# Patient Record
Sex: Female | Born: 1945
Health system: Southern US, Community
[De-identification: ages and names within clinical notes are randomized; demographics above are authoritative.]

## PROBLEM LIST (undated history)

## (undated) DIAGNOSIS — W5501XA Bitten by cat, initial encounter: Secondary | ICD-10-CM

## (undated) DIAGNOSIS — I6529 Occlusion and stenosis of unspecified carotid artery: Secondary | ICD-10-CM

## (undated) DIAGNOSIS — C539 Malignant neoplasm of cervix uteri, unspecified: Secondary | ICD-10-CM

## (undated) DIAGNOSIS — J449 Chronic obstructive pulmonary disease, unspecified: Secondary | ICD-10-CM

## (undated) DIAGNOSIS — I739 Peripheral vascular disease, unspecified: Secondary | ICD-10-CM

## (undated) DIAGNOSIS — I2510 Atherosclerotic heart disease of native coronary artery without angina pectoris: Secondary | ICD-10-CM

## (undated) DIAGNOSIS — C801 Malignant (primary) neoplasm, unspecified: Secondary | ICD-10-CM

## (undated) DIAGNOSIS — E039 Hypothyroidism, unspecified: Secondary | ICD-10-CM

## (undated) DIAGNOSIS — E785 Hyperlipidemia, unspecified: Secondary | ICD-10-CM

## (undated) DIAGNOSIS — Z720 Tobacco use: Secondary | ICD-10-CM

## (undated) DIAGNOSIS — K219 Gastro-esophageal reflux disease without esophagitis: Secondary | ICD-10-CM

## (undated) DIAGNOSIS — N289 Disorder of kidney and ureter, unspecified: Secondary | ICD-10-CM

## (undated) DIAGNOSIS — I499 Cardiac arrhythmia, unspecified: Secondary | ICD-10-CM

## (undated) DIAGNOSIS — I10 Essential (primary) hypertension: Secondary | ICD-10-CM

## (undated) DIAGNOSIS — Z8489 Family history of other specified conditions: Secondary | ICD-10-CM

## (undated) DIAGNOSIS — M1990 Unspecified osteoarthritis, unspecified site: Secondary | ICD-10-CM

## (undated) DIAGNOSIS — R0989 Other specified symptoms and signs involving the circulatory and respiratory systems: Secondary | ICD-10-CM

## (undated) DIAGNOSIS — Z789 Other specified health status: Secondary | ICD-10-CM

## (undated) HISTORY — DX: Chronic obstructive pulmonary disease, unspecified: J44.9

## (undated) HISTORY — DX: Essential (primary) hypertension: I10

## (undated) HISTORY — DX: Other specified symptoms and signs involving the circulatory and respiratory systems: R09.89

## (undated) HISTORY — DX: Tobacco use: Z72.0

## (undated) HISTORY — DX: Bitten by cat, initial encounter: W55.01XA

## (undated) HISTORY — DX: Hyperlipidemia, unspecified: E78.5

## (undated) HISTORY — DX: Hypothyroidism, unspecified: E03.9

## (undated) HISTORY — DX: Occlusion and stenosis of unspecified carotid artery: I65.29

---

## 2009-04-30 HISTORY — PX: INCISION AND DRAINAGE / EXCISION THYROGLOSSAL CYST: SUR667

## 2010-01-15 ENCOUNTER — Emergency Department: Payer: Self-pay | Admitting: Emergency Medicine

## 2012-10-25 ENCOUNTER — Other Ambulatory Visit: Payer: Self-pay | Admitting: Family Medicine

## 2012-10-25 LAB — POTASSIUM: Potassium: 3.7 mmol/L (ref 3.5–5.1)

## 2013-08-18 ENCOUNTER — Ambulatory Visit: Payer: Self-pay | Admitting: Family Medicine

## 2013-09-02 ENCOUNTER — Ambulatory Visit: Payer: Self-pay | Admitting: Vascular Surgery

## 2013-09-15 DIAGNOSIS — I739 Peripheral vascular disease, unspecified: Secondary | ICD-10-CM | POA: Insufficient documentation

## 2013-09-15 DIAGNOSIS — E039 Hypothyroidism, unspecified: Secondary | ICD-10-CM | POA: Insufficient documentation

## 2013-10-13 DIAGNOSIS — J449 Chronic obstructive pulmonary disease, unspecified: Secondary | ICD-10-CM | POA: Insufficient documentation

## 2013-10-21 ENCOUNTER — Ambulatory Visit: Payer: Self-pay | Admitting: Vascular Surgery

## 2013-10-21 LAB — BASIC METABOLIC PANEL
Anion Gap: 6 — ABNORMAL LOW (ref 7–16)
BUN: 7 mg/dL (ref 7–18)
CREATININE: 0.8 mg/dL (ref 0.60–1.30)
Calcium, Total: 8.5 mg/dL (ref 8.5–10.1)
Chloride: 100 mmol/L (ref 98–107)
Co2: 26 mmol/L (ref 21–32)
EGFR (Non-African Amer.): >60
Glucose: 93 mg/dL (ref 65–99)
Osmolality: 262 (ref 275–301)
POTASSIUM: 3.8 mmol/L (ref 3.5–5.1)
Sodium: 132 mmol/L — ABNORMAL LOW (ref 136–145)

## 2013-10-21 LAB — CBC
HCT: 37 % (ref 35.0–47.0)
HGB: 12.9 g/dL (ref 12.0–16.0)
MCH: 32.8 pg (ref 26.0–34.0)
MCHC: 35 g/dL (ref 32.0–36.0)
MCV: 94 fL (ref 80–100)
Platelet: 301 10*3/uL (ref 150–440)
RBC: 3.94 10*6/uL (ref 3.80–5.20)
RDW: 13.3 % (ref 11.5–14.5)
WBC: 8 10*3/uL (ref 3.6–11.0)

## 2013-10-29 ENCOUNTER — Inpatient Hospital Stay: Payer: Self-pay | Admitting: Vascular Surgery

## 2013-10-30 HISTORY — PX: CAROTID ENDARTERECTOMY: SUR193

## 2013-10-30 LAB — APTT: Activated PTT: 33.2 secs (ref 23.6–35.9)

## 2013-10-30 LAB — PROTIME-INR
INR: 1.1
Prothrombin Time: 14.2 secs (ref 11.5–14.7)

## 2013-10-30 LAB — CBC WITH DIFFERENTIAL/PLATELET
BASOS PCT: 0.5 %
Basophil #: 0 10*3/uL (ref 0.0–0.1)
Eosinophil #: 0.1 10*3/uL (ref 0.0–0.7)
Eosinophil %: 1 %
HCT: 31.1 % — ABNORMAL LOW (ref 35.0–47.0)
HGB: 10.8 g/dL — AB (ref 12.0–16.0)
LYMPHS ABS: 1.3 10*3/uL (ref 1.0–3.6)
LYMPHS PCT: 14.5 %
MCH: 32.5 pg (ref 26.0–34.0)
MCHC: 34.8 g/dL (ref 32.0–36.0)
MCV: 94 fL (ref 80–100)
MONOS PCT: 7.4 %
Monocyte #: 0.7 x10 3/mm (ref 0.2–0.9)
Neutrophil #: 7.1 10*3/uL — ABNORMAL HIGH (ref 1.4–6.5)
Neutrophil %: 76.6 %
Platelet: 237 10*3/uL (ref 150–440)
RBC: 3.32 10*6/uL — ABNORMAL LOW (ref 3.80–5.20)
RDW: 13.5 % (ref 11.5–14.5)
WBC: 9.2 10*3/uL (ref 3.6–11.0)

## 2013-10-30 LAB — BASIC METABOLIC PANEL
Anion Gap: 6 — ABNORMAL LOW (ref 7–16)
BUN: 7 mg/dL (ref 7–18)
CO2: 25 mmol/L (ref 21–32)
CREATININE: 0.82 mg/dL (ref 0.60–1.30)
Calcium, Total: 8.1 mg/dL — ABNORMAL LOW (ref 8.5–10.1)
Chloride: 104 mmol/L (ref 98–107)
Glucose: 96 mg/dL (ref 65–99)
OSMOLALITY: 268 (ref 275–301)
Potassium: 3.9 mmol/L (ref 3.5–5.1)
Sodium: 135 mmol/L — ABNORMAL LOW (ref 136–145)

## 2013-10-31 LAB — PATHOLOGY REPORT

## 2014-04-06 DIAGNOSIS — E782 Mixed hyperlipidemia: Secondary | ICD-10-CM | POA: Diagnosis not present

## 2014-04-06 DIAGNOSIS — I739 Peripheral vascular disease, unspecified: Secondary | ICD-10-CM | POA: Diagnosis not present

## 2014-04-06 DIAGNOSIS — I10 Essential (primary) hypertension: Secondary | ICD-10-CM | POA: Diagnosis not present

## 2014-05-01 DIAGNOSIS — I10 Essential (primary) hypertension: Secondary | ICD-10-CM | POA: Diagnosis not present

## 2014-05-01 DIAGNOSIS — I6529 Occlusion and stenosis of unspecified carotid artery: Secondary | ICD-10-CM | POA: Diagnosis not present

## 2014-05-01 HISTORY — PX: OTHER SURGICAL HISTORY: SHX169

## 2014-05-05 ENCOUNTER — Ambulatory Visit
Admit: 2014-05-05 | Disposition: A | Discharge status: Home / Self Care | Payer: Self-pay | Attending: Vascular Surgery | Admitting: Vascular Surgery

## 2014-05-05 DIAGNOSIS — E039 Hypothyroidism, unspecified: Secondary | ICD-10-CM | POA: Diagnosis not present

## 2014-05-05 DIAGNOSIS — F17210 Nicotine dependence, cigarettes, uncomplicated: Secondary | ICD-10-CM | POA: Diagnosis not present

## 2014-05-05 DIAGNOSIS — E785 Hyperlipidemia, unspecified: Secondary | ICD-10-CM | POA: Diagnosis not present

## 2014-05-05 DIAGNOSIS — Z01812 Encounter for preprocedural laboratory examination: Secondary | ICD-10-CM | POA: Diagnosis not present

## 2014-05-05 DIAGNOSIS — J449 Chronic obstructive pulmonary disease, unspecified: Secondary | ICD-10-CM | POA: Diagnosis not present

## 2014-05-05 DIAGNOSIS — Z79899 Other long term (current) drug therapy: Secondary | ICD-10-CM | POA: Diagnosis not present

## 2014-05-05 DIAGNOSIS — R490 Dysphonia: Secondary | ICD-10-CM | POA: Diagnosis not present

## 2014-05-05 DIAGNOSIS — Z01818 Encounter for other preprocedural examination: Secondary | ICD-10-CM | POA: Diagnosis not present

## 2014-05-05 DIAGNOSIS — Z720 Tobacco use: Secondary | ICD-10-CM | POA: Diagnosis not present

## 2014-05-05 DIAGNOSIS — I10 Essential (primary) hypertension: Secondary | ICD-10-CM | POA: Diagnosis not present

## 2014-05-05 LAB — BASIC METABOLIC PANEL
ANION GAP: 10 (ref 7–16)
BUN: 11 mg/dL
CALCIUM: 9.2 mg/dL
Chloride: 95 mmol/L — ABNORMAL LOW
Co2: 27 mmol/L
Creatinine: 0.76 mg/dL
EGFR (African American): >60
EGFR (Non-African Amer.): >60
GLUCOSE: 93 mg/dL
POTASSIUM: 4.1 mmol/L
Sodium: 132 mmol/L — ABNORMAL LOW

## 2014-05-05 LAB — CBC
HCT: 37 % (ref 35.0–47.0)
HGB: 12.7 g/dL (ref 12.0–16.0)
MCH: 31.7 pg (ref 26.0–34.0)
MCHC: 34.2 g/dL (ref 32.0–36.0)
MCV: 93 fL (ref 80–100)
Platelet: 302 10*3/uL (ref 150–440)
RBC: 3.99 10*6/uL (ref 3.80–5.20)
RDW: 13.5 % (ref 11.5–14.5)
WBC: 6.6 10*3/uL (ref 3.6–11.0)

## 2014-05-05 LAB — APTT: Activated PTT: 33.7 secs (ref 23.6–35.9)

## 2014-05-05 LAB — PROTIME-INR
INR: 1
PROTHROMBIN TIME: 13.1 s

## 2014-05-13 ENCOUNTER — Inpatient Hospital Stay
Admit: 2014-05-13 | Disposition: A | Discharge status: Home / Self Care | Payer: Self-pay | Attending: Vascular Surgery | Admitting: Vascular Surgery

## 2014-05-13 DIAGNOSIS — I6522 Occlusion and stenosis of left carotid artery: Secondary | ICD-10-CM | POA: Diagnosis not present

## 2014-05-13 DIAGNOSIS — J449 Chronic obstructive pulmonary disease, unspecified: Secondary | ICD-10-CM | POA: Diagnosis not present

## 2014-05-13 DIAGNOSIS — F17210 Nicotine dependence, cigarettes, uncomplicated: Secondary | ICD-10-CM | POA: Diagnosis not present

## 2014-05-13 DIAGNOSIS — M1990 Unspecified osteoarthritis, unspecified site: Secondary | ICD-10-CM | POA: Diagnosis not present

## 2014-05-13 DIAGNOSIS — E785 Hyperlipidemia, unspecified: Secondary | ICD-10-CM | POA: Diagnosis not present

## 2014-05-13 DIAGNOSIS — F17200 Nicotine dependence, unspecified, uncomplicated: Secondary | ICD-10-CM | POA: Diagnosis not present

## 2014-05-13 DIAGNOSIS — E039 Hypothyroidism, unspecified: Secondary | ICD-10-CM | POA: Diagnosis not present

## 2014-05-14 LAB — PROTIME-INR
INR: 1
Prothrombin Time: 13.5 secs

## 2014-05-14 LAB — CBC WITH DIFFERENTIAL/PLATELET
BASOS PCT: 0.3 %
Basophil #: 0 10*3/uL (ref 0.0–0.1)
EOS ABS: 0.1 10*3/uL (ref 0.0–0.7)
Eosinophil %: 1.3 %
HCT: 33.2 % — ABNORMAL LOW (ref 35.0–47.0)
HGB: 11.2 g/dL — AB (ref 12.0–16.0)
LYMPHS PCT: 15.2 %
Lymphocyte #: 1.3 10*3/uL (ref 1.0–3.6)
MCH: 31.4 pg (ref 26.0–34.0)
MCHC: 33.7 g/dL (ref 32.0–36.0)
MCV: 93 fL (ref 80–100)
MONO ABS: 0.7 x10 3/mm (ref 0.2–0.9)
Monocyte %: 7.8 %
Neutrophil #: 6.5 10*3/uL (ref 1.4–6.5)
Neutrophil %: 75.4 %
PLATELETS: 235 10*3/uL (ref 150–440)
RBC: 3.56 10*6/uL — ABNORMAL LOW (ref 3.80–5.20)
RDW: 13.7 % (ref 11.5–14.5)
WBC: 8.6 10*3/uL (ref 3.6–11.0)

## 2014-05-14 LAB — BASIC METABOLIC PANEL
Anion Gap: 6 — ABNORMAL LOW (ref 7–16)
BUN: 11 mg/dL
CALCIUM: 8.7 mg/dL — AB
CHLORIDE: 96 mmol/L — AB
Co2: 27 mmol/L
Creatinine: 0.78 mg/dL
EGFR (African American): >60
Glucose: 100 mg/dL — ABNORMAL HIGH
POTASSIUM: 4.3 mmol/L
SODIUM: 129 mmol/L — AB

## 2014-05-14 LAB — APTT: ACTIVATED PTT: 35.6 s (ref 23.6–35.9)

## 2014-05-23 NOTE — Op Note (Signed)
PATIENT NAME:  Mary Hoover, Mary Hoover MR#:  [id number] DATE OF BIRTH:  1945-03-04  DATE OF PROCEDURE:  10/29/2013  PREOPERATIVE DIAGNOSES:  1.  Bilateral carotid artery stenosis, right greater than left.  2.  Tobacco dependence.  3.  Hypertension.   POSTOPERATIVE DIAGNOSES:  1.  Bilateral carotid artery stenosis, right greater than left.  2.  Tobacco dependence.  3.  Hypertension.   PROCEDURE:  Right carotid endarterectomy with CorMatrix arterial patch reconstruction.   SURGEON:  Algernon Huxley, MD.   ANESTHESIA:  General.   BLOOD LOSS:  50 mL.   INDICATION FOR PROCEDURE:  This is a 69 year old female without a lot of significant medical history who was referred to [place] for evaluation of carotid artery stenosis. She was found to have about a 70% right ICA stenosis and about 50-60% left ICA stenosis. Given her young age and relative good health and a 70% or more stenosis I felt it was reasonable to offer her right carotid endarterectomy for stroke risk reduction. Risks and benefits were discussed. Informed consent was obtained.   DESCRIPTION OF PROCEDURE: The patient was brought to the operative suite. After an adequate level of general anesthesia was obtained, she was placed in the modified beach chair position. Her right neck was flexed and turned to the side and then sterilely prepped and draped and a sterile surgical field was created. An Charlie Pitter was used for occlusive seal. After appropriate surgical antibiotics and timeout an incision was created along the anterior border of the sternocleidomastoid we dissected down through the platysma with electrocautery. The sternocleidomastoid was retracted laterally. The facial vein and a couple of crossing venous branches were ligated and divided between silk ties exposing the carotid bifurcation. I anesthetized the carotid bifurcation with 1% lidocaine. A Weitlaner retractor was used to help facilitate our exposure. Vessel loops were then placed around the  common carotid artery, external carotid artery, superior thyroid artery and internal carotid artery distal to the lesion. The patient was heparinized with 5000 units of intravenous heparin. After this was allowed to circulate for 5 minutes control was pulled up on the vessel loops and anterior wall arteriotomy was created with an 11 blade and extended with Potts scissors. The Pruitt-Inahara shunt was then placed first in the internal carotid artery, flushed and de-aired, then in the common carotid artery and flow was restored. Approximately 2 minutes elapsed between clamping and restoration of flow. We then performed an endarterectomy in the typical fashion. The proximal endpoint was cut flush with tenotomy scissors. An eversion endarterectomy was performed on the external carotid artery and a nice feathered distal endpoint was created on the internal carotid artery with gentle traction. The distal endpoint was tacked down with two 7-0 Prolene sutures. All loose flecks were removed and the vessel was locally heparinized.   The CorMatrix arterial patch was brought onto the field to reconstruct the artery. Her arteries were reasonably small and primary closure would have likely resulted in recurrent stenosis. The patch was cut and beveled. We started at the distal endpoint with 6-0 Prolene suture. It was run approximately 1/2 the length of the arteriotomy both medially and laterally. The patch was then cut and beveled to an appropriate length proximally, and a second 6-0 Prolene was started at the proximal endpoint. This was run medially and the suture lines were tied together. The lateral suture line was run approximately 1/4 the length of the arteriotomy, and then the shunt was removed. The vessel was flushed in the  internal, external and common carotid arteries and locally heparinized. The suture line was then completed, flushing several cycles through the external carotid artery prior to releasing control. From  clamp removal to restoration of flow was approximately 2-3 minutes. Two 6-0 Prolene patch sutures were used for hemostasis and hemostasis was achieved. The wound was irrigated. Surgicel and Evicel topical hemostatic agents were placed and hemostasis was complete. The wound was then closed with 3 interrupted 3-0 Vicryl sutures in the sternocleidomastoid space. The platysma was closed with a running 3-0 Vicryl and the skin was closed with 4-0 Vicryl. Dermabond was placed as a dressing. The patient was awakened from anesthesia and taken to the recovery room in stable condition having tolerated the procedure well.    ____________________________ Algernon Huxley, MD jsd:lt D: 10/29/2013 09:42:30 ET T: 10/29/2013 11:39:13 ET JOB#: [id number]  cc: Algernon Huxley, MD, <Dictator> Enid Derry, MD Algernon Huxley MD ELECTRONICALLY SIGNED 11/11/2013 10:21

## 2014-05-25 LAB — SURGICAL PATHOLOGY

## 2014-05-31 NOTE — Op Note (Signed)
PATIENT NAME:  Mary Hoover, Mary Hoover MR#:  [id number] DATE OF BIRTH:  10-15-1945  DATE OF PROCEDURE:  05/13/2014  PREOPERATIVE DIAGNOSES:  1. High-grade left carotid artery stenosis.  2. Status post right carotid endarterectomy.  3. Hypothyroidism.  4. Hyperlipidemia.  5. Tobacco dependence.   POSTOPERATIVE DIAGNOSES:  1. High-grade left carotid artery stenosis.  2. Status post right carotid endarterectomy.  3. Hypothyroidism.  4. Hyperlipidemia.  5. Tobacco dependence.   PROCEDURE: Left carotid endarterectomy with CorMatrix arterial patch reconstruction.   SURGEON: Leotis Pain, MD   ANESTHESIA: General.   ESTIMATED BLOOD LOSS: 25 mL.   INDICATION FOR PROCEDURE: This is a 69 year old female with carotid artery stenosis. Last year, she underwent a right carotid endarterectomy for high-grade stenosis. She had a borderline high grade stenosis at that time on the left side. We followed this, but on her most recent duplex, her velocities had significantly elevated into the clearly the high-grade range of what would appear to be 80% or more. We recommended carotid endarterectomy for stroke risk reduction. Risks and benefits were discussed and informed consent was obtained.   DESCRIPTION OF PROCEDURE: The patient was brought to the operative suite. After an adequate level of general anesthesia was obtained, the patient was placed in modified beach chair position. Her neck was flexed, a roll was placed on her shoulder and her head was turned to the right. After appropriate surgical timeout and intravenous antibiotics, incision was created along the anterior border of the sternocleidomastoid and we dissected through the platysma with electrocautery. The sternocleidomastoid was retracted laterally the facial vein. This was ligated and divided between silk ties, exposing the carotid bifurcation. The vagus nerve was identified and protected from harm. The carotid artery was dissected out. The common carotid  artery, external carotid artery, superior thyroid artery, and internal carotid artery distal to the lesion, which was several centimeters into the internal carotid artery. This required identifying the hypoglossal nerve and protecting it from harm, which was done, and the patient was given 5000 units of intravenous heparin for systemic anticoagulation and control was then pulled up on the vessel loops and after this was allowed to circulate for 4 minutes. An anterior wall arteriotomy 11 blade and extended with Potts scissors. The Pruitt-Inahara shunt was placed. The endarterectomy was then performed in the typical fashion. The proximal endpoint was cut flush with tenotomy scissors. An eversion endarterectomy was performed on the external carotid artery and a nice feathered distal endpoint was created with gentle traction on the internal carotid artery. All loose flecks were removed. The distal endpoint was tacked down with two 7-0 Prolene sutures in the cor CorMatrix patch was used to reconstruct the artery. This was cut and beveled distally and a 6-0 Prolene was started at the distal endpoint and run approximately one half the length of the arteriotomy. The patch was then cut and beveled to an appropriate length to match the arteriotomy proximally and a second 6-0 Prolene was started. This was run medially and the suture line was tied together. The lateral suture line was run until the shunt needed to be removed. The shunt was then removed. The vessel was flushed in the internal, external and common carotid artery and locally heparinized. I then completed the suture line flushing through the external carotid artery and allowing several cardiac cycles to traverse up the external carotid artery prior to releasing control of the internal carotid artery. On release, two 6-0 Prolene patch sutures were used for hemostasis. Surgicel and  Evicel topical hemostatic agents were placed and hemostasis was achieved. The wound  was then closed with 3 interrupted 3-0 Vicryl sutures in the sternocleidomastoid space. The platysma was closed with a running 3-0 Vicryl and the skin was closed with 4-0 Monocryl. Dermabond was placed as a dressing. The patient was awakened from anesthesia and taken to the recovery room in stable condition, having tolerated the procedure well.   ____________________________ Mary Huxley, MD jsd:AT D: 05/13/2014 11:46:27 ET T: 05/13/2014 12:24:28 ET JOB#: [id number]  cc: Mary Huxley, MD, <Dictator> Mary Huxley MD ELECTRONICALLY SIGNED 05/27/2014 13:45

## 2014-06-03 DIAGNOSIS — D649 Anemia, unspecified: Secondary | ICD-10-CM | POA: Diagnosis not present

## 2014-06-03 DIAGNOSIS — E785 Hyperlipidemia, unspecified: Secondary | ICD-10-CM | POA: Diagnosis not present

## 2014-06-03 DIAGNOSIS — E871 Hypo-osmolality and hyponatremia: Secondary | ICD-10-CM | POA: Diagnosis not present

## 2014-06-25 DIAGNOSIS — I10 Essential (primary) hypertension: Secondary | ICD-10-CM | POA: Diagnosis not present

## 2014-06-25 DIAGNOSIS — E871 Hypo-osmolality and hyponatremia: Secondary | ICD-10-CM | POA: Diagnosis not present

## 2014-07-06 ENCOUNTER — Other Ambulatory Visit: Payer: Self-pay | Admitting: Family Medicine

## 2014-07-06 MED ORDER — LISINOPRIL 10 MG PO TABS: 10.0000 mg | ORAL_TABLET | Freq: Every day | ORAL | Status: DC

## 2014-09-11 DIAGNOSIS — I6529 Occlusion and stenosis of unspecified carotid artery: Secondary | ICD-10-CM | POA: Diagnosis not present

## 2014-09-11 DIAGNOSIS — I6523 Occlusion and stenosis of bilateral carotid arteries: Secondary | ICD-10-CM | POA: Diagnosis not present

## 2014-09-11 DIAGNOSIS — I10 Essential (primary) hypertension: Secondary | ICD-10-CM | POA: Diagnosis not present

## 2014-10-24 ENCOUNTER — Other Ambulatory Visit: Payer: Self-pay | Admitting: Family Medicine

## 2014-10-24 NOTE — Telephone Encounter (Signed)
Her HCTZ was stopped because of low Na+ and low Cl- Her recheck BMP in [facility] was better Last office note shows HCTZ was discontinued I'm denying this request

## 2014-10-28 DIAGNOSIS — E039 Hypothyroidism, unspecified: Secondary | ICD-10-CM | POA: Diagnosis not present

## 2014-11-02 DIAGNOSIS — E039 Hypothyroidism, unspecified: Secondary | ICD-10-CM | POA: Diagnosis not present

## 2014-11-13 DIAGNOSIS — Z9889 Other specified postprocedural states: Secondary | ICD-10-CM | POA: Insufficient documentation

## 2014-11-13 DIAGNOSIS — J449 Chronic obstructive pulmonary disease, unspecified: Secondary | ICD-10-CM | POA: Insufficient documentation

## 2014-11-13 DIAGNOSIS — Z720 Tobacco use: Secondary | ICD-10-CM | POA: Insufficient documentation

## 2014-11-13 DIAGNOSIS — E785 Hyperlipidemia, unspecified: Secondary | ICD-10-CM | POA: Insufficient documentation

## 2014-11-13 DIAGNOSIS — I10 Essential (primary) hypertension: Secondary | ICD-10-CM | POA: Insufficient documentation

## 2014-11-13 DIAGNOSIS — E039 Hypothyroidism, unspecified: Secondary | ICD-10-CM | POA: Insufficient documentation

## 2014-11-25 ENCOUNTER — Ambulatory Visit (INDEPENDENT_AMBULATORY_CARE_PROVIDER_SITE_OTHER): Payer: Medicare Other | Admitting: Family Medicine

## 2014-11-25 ENCOUNTER — Encounter: Payer: Self-pay | Admitting: Family Medicine

## 2014-11-25 VITALS — BP 156/80 | HR 79 | Temp 98.0°F | Ht 67.75 in | Wt 136.0 lb

## 2014-11-25 DIAGNOSIS — E034 Atrophy of thyroid (acquired): Secondary | ICD-10-CM

## 2014-11-25 DIAGNOSIS — Z9889 Other specified postprocedural states: Secondary | ICD-10-CM

## 2014-11-25 DIAGNOSIS — E038 Other specified hypothyroidism: Secondary | ICD-10-CM

## 2014-11-25 DIAGNOSIS — Z720 Tobacco use: Secondary | ICD-10-CM

## 2014-11-25 DIAGNOSIS — I10 Essential (primary) hypertension: Secondary | ICD-10-CM | POA: Diagnosis not present

## 2014-11-25 DIAGNOSIS — Z23 Encounter for immunization: Secondary | ICD-10-CM

## 2014-11-25 DIAGNOSIS — Z5181 Encounter for therapeutic drug level monitoring: Secondary | ICD-10-CM | POA: Diagnosis not present

## 2014-11-25 DIAGNOSIS — E785 Hyperlipidemia, unspecified: Secondary | ICD-10-CM | POA: Diagnosis not present

## 2014-11-25 MED ORDER — LISINOPRIL 20 MG PO TABS: 20.0000 mg | ORAL_TABLET | Freq: Every day | ORAL | Status: DC

## 2014-11-25 NOTE — Assessment & Plan Note (Signed)
She unfortunately still smokes; I am here to help her quit if/when she decides she is ready; see AVS

## 2014-11-25 NOTE — Progress Notes (Signed)
 BP 156/80 mmHg  Pulse 79  Temp(Src) 98 F (36.7 C)  Ht 5' 7.75" (1.721 m)  Wt 136 lb (61.689 kg)  BMI 20.83 kg/m2  SpO2 100%   Subjective:    Patient ID: Mary Hoover, female    DOB: 10/06/1945, 69 y.o.   MRN: [id number]  HPI: Mary Hoover is a 69 y.o. female  Chief Complaint  Patient presents with  . Hyperlipidemia  . Hypertension   High blood pressure; she checks her BP away from here; 146/70 or sometimes higher; no lower blood pressures, no lows The road in [place] was closed today and she had to go the long way around and then a train was coming, barely got here on time today Taking medicine every day; she loves salt, but she has cut back; still puts a little salt in her food, not near as much as she'd like; she has to limit salt to her husband's food; she does not read labels for sodium content; she uses benadryl every once in a while, [place] Rarely eats black licorice Does eat bacon or sausage, one or the other, every Sunday  High cholesterol; limiting bacon and sausage; drinks milk with cereal, whole milk; she would be willing to switch to 2% when I brought that up; she used to eat more cheese; one of her doctors (either me or Dr. Dew) told her to cut back, so she did; she eats egg on Sunday morning  Hypothyroidism; seeing Dr. Solum once a year; TSH on Sept 28, 2016 3.303; normal energy and stable weight  She has carotid blockages and sees Dr. Dew every 6 months; he does not do her labs  She still smokes and is not ready to quit  Relevant past medical, surgical, family and social history reviewed and updated as indicated. Interim medical history since our last visit reviewed. No trips to ER, no doctor visits, no broken bones or stitches.  Allergies and medications reviewed and updated.  Review of Systems  Respiratory:       "breathing is good"  Cardiovascular: Negative for chest pain, palpitations and leg  swelling.  no abdominal pain, no muscle pain Per HPI unless specifically indicated above     Objective:    BP 156/80 mmHg  Pulse 79  Temp(Src) 98 F (36.7 C)  Ht 5' 7.75" (1.721 m)  Wt 136 lb (61.689 kg)  BMI 20.83 kg/m2  SpO2 100%  Wt Readings from Last 3 Encounters:  11/25/14 136 lb (61.689 kg)  06/25/14 138 lb (62.596 kg)    Physical Exam  Constitutional: She appears well-developed and well-nourished. No distress.  HENT:  Head: Normocephalic and atraumatic.  Eyes: EOM are normal. No scleral icterus.  Neck: Carotid bruit is not present. No thyromegaly present.  Cardiovascular: Normal rate, regular rhythm and normal heart sounds.   No murmur heard. Pulmonary/Chest: Effort normal and breath sounds normal. No respiratory distress. She has no wheezes.  Abdominal: Soft. Bowel sounds are normal. She exhibits no distension.  Musculoskeletal: Normal range of motion. She exhibits no edema.  Neurological: She is alert. She exhibits normal muscle tone.  Skin: Skin is warm and dry. No ecchymosis noted. She is not diaphoretic. No pallor.  Aging consistent with chronic smoking  Psychiatric: She has a normal mood and affect. Her behavior is normal. Judgment and thought content normal.    Results for orders placed or performed during the hospital encounter of 05/13/14    CBC with Differential/Platelet  Result Value Ref Range   WBC 8.6 3.6-11.0 x10 3/mm 3   RBC 3.56 (L) 3.80-5.20 X10 6/mm 3   HGB 11.2 (L) 12.0-16.0 g/dL   HCT 33.2 (L) 35.0-47.0 %   MCV 93 80-100 fL   MCH 31.4 26.0-34.0 pg   MCHC 33.7 32.0-36.0 g/dL   RDW 13.7 11.5-14.5 %   Platelet 235 150-440 x10 3/mm 3   Neutrophil % 75.4 %   Lymphocyte % 15.2 %   Monocyte % 7.8 %   Eosinophil % 1.3 %   Basophil % 0.3 %   Neutrophil # 6.5 1.4-6.5 x10 3/mm 3   Lymphocyte # 1.3 1.0-3.6 x10 3/mm 3   Monocyte # 0.7 0.2-0.9 x10 3/mm    Eosinophil # 0.1 0.0-0.7 x10 3/mm 3   Basophil # 0.0 0.0-0.1 x10 3/mm 3  Basic metabolic panel   Result Value Ref Range   Glucose, CSF DNP mg/dL   BUN 11 mg/dL   Creatinine 0.78 mg/dL   Sodium, Urine Random DNP mmol/L   Potassium, Urine Random DNP mmol/L   Chloride, Urine Random DNP mmol/L   Co2 27 mmol/L   Calcium, Total 8.7 (L) mg/dL   Anion Gap 6 (L) 7-16   EGFR (African American) >60    EGFR (Non-African Amer.) >60    Glucose 100 (H) mg/dL   Sodium 129 (L) mmol/L   Potassium 4.3 mmol/L   Chloride 96 (L) mmol/L  Protime-INR  Result Value Ref Range   Prothrombin Time 13.5 secs   INR 1.0   APTT  Result Value Ref Range   Activated PTT 35.6 23.6-35.9 secs  Surgical pathology  Result Value Ref Range   SURGICAL PATHOLOGY      Surgical Procedure CASE: [id number] PATIENT: Mary Hoover Surgical Pathology Report     SPECIMEN SUBMITTED: A. Carotid plaque, left  CLINICAL HISTORY: None provided  PRE-OPERATIVE DIAGNOSIS: Left carotid artery stenosis  POST-OPERATIVE DIAGNOSIS: Same     DIAGNOSIS: A. LEFT CAROTID ARTERY; ENDARTERECTOMY: - CALCIFIED ATHEROSCLEROTIC PLAQUE.   GROSS DESCRIPTION: A. Labeled: Left carotid plaque Tissue Fragment(s): 1 Measurement: 3.2 x 1.0 x 0.5 cm Comment: Pink to tan with focal calcification Representative submitted in cassette(s): 1 following decalcification   Final Diagnosis performed by Bryan Lemma, MD.  Electronically signed 05/14/2014 6:36:43PM    The electronic signature indicates that the named Attending Pathologist has evaluated the specimen  Technical component performed at [facility], [address] [place], [place] [address] Lab: [phone] Dir: Darrick Penna. Evette Doffing, MD  Professional component performed at [facility], [facility], [place], [place], [place] [address] Lab: [phone] Dir: Dellia Nims. Rubinas, MD        Assessment & Plan:   Problem List Items Addressed This Visit      Cardiovascular and Mediastinum   Hypertension - Primary    Not under ideal control; so glad  she has cut back on her sodium intake; try to follow DASH guidelines; see AVS; increase the ACE-I from 10 mg daily to 20 mg daily; new Rx sent to mail order and she'll use up the 10 mg pills she has (2x10=20), and return in 2-1/2 weeks for recheck with Amy and lab draw; smoking cessation would help lower BP too      Relevant Medications   lisinopril (PRINIVIL,ZESTRIL) 20 MG tablet     Endocrine   Hypothyroidism    Weight stable; energy is good; last TSH normal; monitored by endocrinologist  Other   Hyperlipidemia    Continue statin; no adverse symptoms from medicine; return for fasting labs; goal LDL less than 70 ideally; limit saturated fats; see AVS      Relevant Medications   lisinopril (PRINIVIL,ZESTRIL) 20 MG tablet   Other Relevant Orders   Lipid Panel w/o Chol/HDL Ratio   Status post carotid endarterectomy    Managed by Dr. Lucky Cowboy, sees every 6 months for a while; monitor lipids, goal LDL under 70 ideally; taking clopidogrel      Tobacco use    She unfortunately still smokes; I am here to help her quit if/when she decides she is ready; see AVS      Medication monitoring encounter    Monitor SGPT on the statin; monitor creatinine and K+ on the ACE-I, monitor CBC on the clopidogrel; she'll return for those labs around November 14th      Relevant Orders   Comprehensive metabolic panel   CBC with Differential/Platelet    Other Visit Diagnoses    Needs flu shot        flu vaccine offered and given today    Encounter for immunization           Follow up plan: Return in about 3 weeks (around 12/14/2014) for fasting labs and BP check with AMY; return to see Dr. Sanda Klein in 7 months.  An after-visit summary was printed and given to the patient at [facility].  Please see the patient instructions which may contain other information and recommendations beyond what is mentioned above in the assessment and plan.  Orders Placed This Encounter  Procedures  . Flu Vaccine QUAD  36+ mos IM  . Lipid Panel w/o Chol/HDL Ratio  . Comprehensive metabolic panel  . CBC with Differential/Platelet   Meds ordered this encounter  Medications  . lisinopril (PRINIVIL,ZESTRIL) 20 MG tablet    Sig: Take 1 tablet (20 mg total) by mouth daily.    Dispense:  90 tablet    Refill:  1    We are increasing the strength from 10 mg to 20 mg   Medications Discontinued During This Encounter  Medication Reason  . lisinopril (PRINIVIL,ZESTRIL) 10 MG tablet Dose change

## 2014-11-25 NOTE — Patient Instructions (Addendum)
You received the flu shot today; it should protect you against the flu virus over the coming months; it will take about two weeks for antibodies to develop; do try to stay away from hospitals, nursing homes, and daycares during peak flu season; taking 1000 mg of vitamin C daily during flu season may help you avoid getting sick  Try to use PLAIN allergy medicine without the decongestant Avoid: phenylephrine, phenylpropanolamine, and pseudoephredine  Try to limit saturated fats in your diet (bologna, hot dogs, barbeque, cheeseburgers, hamburgers, steak, bacon, sausage, cheese, etc.) and get more fresh fruits, vegetables, and whole grains  Return for fasting labs and visit for blood pressure with Amy on November 14th  Try to follow the DASH guidelines  I am here to help you quit smoking if/when the day comes that you are ready to quit; it would be the BEST thing you could do for your health and your wallet  Increase the blood pressure pill lisinopril from 10 mg a day to 20 mg a day  DASH Eating Plan DASH stands for "Dietary Approaches to Stop Hypertension." The DASH eating plan is a healthy eating plan that has been shown to reduce high blood pressure (hypertension). Additional health benefits may include reducing the risk of type 2 diabetes mellitus, heart disease, and stroke. The DASH eating plan may also help with weight loss. WHAT DO I NEED TO KNOW ABOUT THE DASH EATING PLAN? For the DASH eating plan, you will follow these general guidelines:  Choose foods with a percent daily value for sodium of less than 5% (as listed on the food label).  Use salt-free seasonings or herbs instead of table salt or sea salt.  Check with your health care provider or pharmacist before using salt substitutes.  Eat lower-sodium products, often labeled as "lower sodium" or "no salt added."  Eat fresh foods.  Eat more vegetables, fruits, and low-fat dairy products.  Choose whole grains. Look for the word  "whole" as the first word in the ingredient list.  Choose fish and skinless chicken or [place] more often than red meat. Limit fish, poultry, and meat to 6 oz (170 g) each day.  Limit sweets, desserts, sugars, and sugary drinks.  Choose heart-healthy fats.  Limit cheese to 1 oz (28 g) per day.  Eat more home-cooked food and less restaurant, buffet, and fast food.  Limit fried foods.  Cook foods using methods other than frying.  Limit canned vegetables. If you do use them, rinse them well to decrease the sodium.  When eating at a restaurant, ask that your food be prepared with less salt, or no salt if possible. WHAT FOODS CAN I EAT? Seek help from a dietitian for individual calorie needs. Grains Whole grain or whole wheat bread. Brown rice. Whole grain or whole wheat pasta. Quinoa, bulgur, and whole grain cereals. Low-sodium cereals. Corn or whole wheat flour tortillas. Whole grain cornbread. Whole grain crackers. Low-sodium crackers. Vegetables Fresh or frozen vegetables (raw, steamed, roasted, or grilled). Low-sodium or reduced-sodium tomato and vegetable juices. Low-sodium or reduced-sodium tomato sauce and paste. Low-sodium or reduced-sodium canned vegetables.  Fruits All fresh, canned (in natural juice), or frozen fruits. Meat and Other Protein Products Ground beef (85% or leaner), grass-fed beef, or beef trimmed of fat. Skinless chicken or [place]. Ground chicken or [place]. Pork trimmed of fat. All fish and seafood. Eggs. Dried beans, peas, or lentils. Unsalted nuts and seeds. Unsalted canned beans. Dairy Low-fat dairy products, such as skim or 1% milk,  2% or reduced-fat cheeses, low-fat ricotta or cottage cheese, or plain low-fat yogurt. Low-sodium or reduced-sodium cheeses. Fats and Oils Tub margarines without trans fats. Light or reduced-fat mayonnaise and salad dressings (reduced sodium). Avocado. Safflower, olive, or canola oils. Natural peanut or almond  butter. Other Unsalted popcorn and pretzels. The items listed above may not be a complete list of recommended foods or beverages. Contact your dietitian for more options. WHAT FOODS ARE NOT RECOMMENDED? Grains White bread. White pasta. White rice. Refined cornbread. Bagels and croissants. Crackers that contain trans fat. Vegetables Creamed or fried vegetables. Vegetables in a cheese sauce. Regular canned vegetables. Regular canned tomato sauce and paste. Regular tomato and vegetable juices. Fruits Dried fruits. Canned fruit in light or heavy syrup. Fruit juice. Meat and Other Protein Products Fatty cuts of meat. Ribs, chicken wings, bacon, sausage, bologna, salami, chitterlings, fatback, hot dogs, bratwurst, and packaged luncheon meats. Salted nuts and seeds. Canned beans with salt. Dairy Whole or 2% milk, cream, half-and-half, and cream cheese. Whole-fat or sweetened yogurt. Full-fat cheeses or blue cheese. Nondairy creamers and whipped toppings. Processed cheese, cheese spreads, or cheese curds. Condiments Onion and garlic salt, seasoned salt, table salt, and sea salt. Canned and packaged gravies. Worcestershire sauce. Tartar sauce. Barbecue sauce. Teriyaki sauce. Soy sauce, including reduced sodium. Steak sauce. Fish sauce. Oyster sauce. Cocktail sauce. Horseradish. Ketchup and mustard. Meat flavorings and tenderizers. Bouillon cubes. Hot sauce. Tabasco sauce. Marinades. Taco seasonings. Relishes. Fats and Oils Butter, stick margarine, lard, shortening, ghee, and bacon fat. Coconut, palm kernel, or palm oils. Regular salad dressings. Other Pickles and olives. Salted popcorn and pretzels. The items listed above may not be a complete list of foods and beverages to avoid. Contact your dietitian for more information. WHERE CAN I FIND MORE INFORMATION? National Heart, Lung, and Blood Institute: travelstabloid.com   This information is not intended to replace  advice given to you by your health care provider. Make sure you discuss any questions you have with your health care provider.   Document Released: 01/05/2011 Document Revised: 02/06/2014 Document Reviewed: 11/20/2012 Elsevier Interactive Patient Education 2016 Elsevier Inc. Cholesterol Cholesterol is a white, waxy, fat-like substance needed by your body in small amounts. The liver makes all the cholesterol you need. Cholesterol is carried from the liver by the blood through the blood vessels. Deposits of cholesterol (plaque) may build up on blood vessel walls. These make the arteries narrower and stiffer. Cholesterol plaques increase the risk for heart attack and stroke.  You cannot feel your cholesterol level even if it is very high. The only way to know it is high is with a blood test. Once you know your cholesterol levels, you should keep a record of the test results. Work with your health care provider to keep your levels in the desired range.  WHAT DO THE RESULTS MEAN?  Total cholesterol is a rough measure of all the cholesterol in your blood.   LDL is the so-called bad cholesterol. This is the type that deposits cholesterol in the walls of the arteries. You want this level to be low.   HDL is the good cholesterol because it cleans the arteries and carries the LDL away. You want this level to be high.  Triglycerides are fat that the body can either burn for energy or store. High levels are closely linked to heart disease.  WHAT ARE THE DESIRED LEVELS OF CHOLESTEROL?  Total cholesterol below 200.   LDL below 100 for people at risk, below 70  for those at very high risk.   HDL above 50 is good, above 60 is best.   Triglycerides below 150.  HOW CAN I LOWER MY CHOLESTEROL?  Diet. Follow your diet programs as directed by your health care provider.   Choose fish or white meat chicken and [place], roasted or baked. Limit fatty cuts of red meat, fried foods, and processed meats,  such as sausage and lunch meats.   Eat lots of fresh fruits and vegetables.  Choose whole grains, beans, pasta, potatoes, and cereals.   Use only small amounts of olive, corn, or canola oils.   Avoid butter, mayonnaise, shortening, or palm kernel oils.  Avoid foods with trans fats.   Drink skim or nonfat milk and eat low-fat or nonfat yogurt and cheeses. Avoid whole milk, cream, ice cream, egg yolks, and full-fat cheeses.   Healthy desserts include angel food cake, ginger snaps, animal crackers, hard candy, popsicles, and low-fat or nonfat frozen yogurt. Avoid pastries, cakes, pies, and cookies.   Exercise. Follow your exercise programs as directed by your health care provider.   A regular program helps decrease LDL and raise HDL.   A regular program helps with weight control.   Do things that increase your activity level like gardening, walking, or taking the stairs. Ask your health care provider about how you can be more active in your daily life.   Medicine. Take medicine only as directed by your health care provider.   Medicine may be prescribed by your health care provider to help lower cholesterol and decrease the risk for heart disease.   If you have several risk factors, you may need medicine even if your levels are normal.   This information is not intended to replace advice given to you by your health care provider. Make sure you discuss any questions you have with your health care provider.   Document Released: 10/11/2000 Document Revised: 02/06/2014 Document Reviewed: 10/30/2012 Elsevier Interactive Patient Education 2016 [employer]. Smoking Cessation, Tips for Success If you are ready to quit smoking, congratulations! You have chosen to help yourself be healthier. Cigarettes bring nicotine, tar, carbon monoxide, and other irritants into your body. Your lungs, heart, and blood vessels will be able to work better without these poisons. There are many  different ways to quit smoking. Nicotine gum, nicotine patches, a nicotine inhaler, or nicotine nasal spray can help with physical craving. Hypnosis, support groups, and medicines help break the habit of smoking. WHAT THINGS CAN I DO TO MAKE QUITTING EASIER?  Here are some tips to help you quit for good:  Pick a date when you will quit smoking completely. Tell all of your friends and family about your plan to quit on that date.  Do not try to slowly cut down on the number of cigarettes you are smoking. Pick a quit date and quit smoking completely starting on that day.  Throw away all cigarettes.   Clean and remove all ashtrays from your home, work, and car.  On a card, write down your reasons for quitting. Carry the card with you and read it when you get the urge to smoke.  Cleanse your body of nicotine. Drink enough water and fluids to keep your urine clear or pale yellow. Do this after quitting to flush the nicotine from your body.  Learn to predict your moods. Do not let a bad situation be your excuse to have a cigarette. Some situations in your life might tempt you into wanting a  cigarette.  Never have "just one" cigarette. It leads to wanting another and another. Remind yourself of your decision to quit.  Change habits associated with smoking. If you smoked while driving or when feeling stressed, try other activities to replace smoking. Stand up when drinking your coffee. Brush your teeth after eating. Sit in a different chair when you read the paper. Avoid alcohol while trying to quit, and try to drink fewer caffeinated beverages. Alcohol and caffeine may urge you to smoke.  Avoid foods and drinks that can trigger a desire to smoke, such as sugary or spicy foods and alcohol.  Ask people who smoke not to smoke around you.  Have something planned to do right after eating or having a cup of coffee. For example, plan to take a walk or exercise.  Try a relaxation exercise to calm you  down and decrease your stress. Remember, you may be tense and nervous for the first 2 weeks after you quit, but this will pass.  Find new activities to keep your hands busy. Play with a pen, coin, or rubber band. Doodle or draw things on paper.  Brush your teeth right after eating. This will help cut down on the craving for the taste of tobacco after meals. You can also try mouthwash.   Use oral substitutes in place of cigarettes. Try using lemon drops, carrots, cinnamon sticks, or chewing gum. Keep them handy so they are available when you have the urge to smoke.  When you have the urge to smoke, try deep breathing.  Designate your home as a nonsmoking area.  If you are a heavy smoker, ask your health care provider about a prescription for nicotine chewing gum. It can ease your withdrawal from nicotine.  Reward yourself. Set aside the cigarette money you save and buy yourself something nice.  Look for support from others. Join a support group or smoking cessation program. Ask someone at home or at work to help you with your plan to quit smoking.  Always ask yourself, "Do I need this cigarette or is this just a reflex?" Tell yourself, "Today, I choose not to smoke," or "I do not want to smoke." You are reminding yourself of your decision to quit.  Do not replace cigarette smoking with electronic cigarettes (commonly called e-cigarettes). The safety of e-cigarettes is unknown, and some may contain harmful chemicals.  If you relapse, do not give up! Plan ahead and think about what you will do the next time you get the urge to smoke. HOW WILL I FEEL WHEN I QUIT SMOKING? You may have symptoms of withdrawal because your body is used to nicotine (the addictive substance in cigarettes). You may crave cigarettes, be irritable, feel very hungry, cough often, get headaches, or have difficulty concentrating. The withdrawal symptoms are only temporary. They are strongest when you first quit but will go  away within 10-14 days. When withdrawal symptoms occur, stay in control. Think about your reasons for quitting. Remind yourself that these are signs that your body is healing and getting used to being without cigarettes. Remember that withdrawal symptoms are easier to treat than the major diseases that smoking can cause.  Even after the withdrawal is over, expect periodic urges to smoke. However, these cravings are generally short lived and will go away whether you smoke or not. Do not smoke! WHAT RESOURCES ARE AVAILABLE TO HELP ME QUIT SMOKING? Your health care provider can direct you to community resources or hospitals for support, which may  include:  Group support.  Education.  Hypnosis.  Therapy.   This information is not intended to replace advice given to you by your health care provider. Make sure you discuss any questions you have with your health care provider.   Document Released: 10/15/2003 Document Revised: 02/06/2014 Document Reviewed: 07/04/2012 Elsevier Interactive Patient Education [employer].

## 2014-11-25 NOTE — Assessment & Plan Note (Signed)
Not under ideal control; so glad she has cut back on her sodium intake; try to follow DASH guidelines; see AVS; increase the ACE-I from 10 mg daily to 20 mg daily; new Rx sent to mail order and she'll use up the 10 mg pills she has (2x10=20), and return in 2-1/2 weeks for recheck with Amy and lab draw; smoking cessation would help lower BP too

## 2014-11-25 NOTE — Assessment & Plan Note (Signed)
Continue statin; no adverse symptoms from medicine; return for fasting labs; goal LDL less than 70 ideally; limit saturated fats; see AVS

## 2014-11-25 NOTE — Assessment & Plan Note (Addendum)
Weight stable; energy is good; last TSH normal; monitored by endocrinologist

## 2014-11-25 NOTE — Assessment & Plan Note (Signed)
Managed by Dr. Lucky Cowboy, sees every 6 months for a while; monitor lipids, goal LDL under 70 ideally; taking clopidogrel

## 2014-11-25 NOTE — Assessment & Plan Note (Signed)
Monitor SGPT on the statin; monitor creatinine and K+ on the ACE-I, monitor CBC on the clopidogrel; she'll return for those labs around November 14th

## 2014-12-11 ENCOUNTER — Encounter: Payer: Self-pay | Admitting: Emergency Medicine

## 2014-12-11 ENCOUNTER — Observation Stay
Admission: EM | Admit: 2014-12-11 | Discharge: 2014-12-12 | Disposition: A | Discharge status: Home / Self Care | Payer: Medicare Other | Attending: Internal Medicine | Admitting: Internal Medicine

## 2014-12-11 ENCOUNTER — Ambulatory Visit
Admission: RE | Admit: 2014-12-11 | Discharge: 2014-12-11 | Disposition: A | Discharge status: Home / Self Care | Payer: Medicare Other | Source: Ambulatory Visit | Attending: Family Medicine | Admitting: Family Medicine

## 2014-12-11 ENCOUNTER — Encounter: Payer: Self-pay | Admitting: Family Medicine

## 2014-12-11 ENCOUNTER — Ambulatory Visit: Admission: RE | Admit: 2014-12-11 | Payer: Medicare Other | Source: Ambulatory Visit | Admitting: *Deleted

## 2014-12-11 ENCOUNTER — Ambulatory Visit (INDEPENDENT_AMBULATORY_CARE_PROVIDER_SITE_OTHER): Payer: Medicare Other | Admitting: Family Medicine

## 2014-12-11 ENCOUNTER — Emergency Department: Payer: Medicare Other

## 2014-12-11 VITALS — BP 146/77 | HR 73 | Temp 97.2°F | Wt 136.0 lb

## 2014-12-11 DIAGNOSIS — E785 Hyperlipidemia, unspecified: Secondary | ICD-10-CM | POA: Diagnosis not present

## 2014-12-11 DIAGNOSIS — I10 Essential (primary) hypertension: Secondary | ICD-10-CM | POA: Diagnosis not present

## 2014-12-11 DIAGNOSIS — Z7982 Long term (current) use of aspirin: Secondary | ICD-10-CM | POA: Insufficient documentation

## 2014-12-11 DIAGNOSIS — J449 Chronic obstructive pulmonary disease, unspecified: Secondary | ICD-10-CM | POA: Insufficient documentation

## 2014-12-11 DIAGNOSIS — R042 Hemoptysis: Secondary | ICD-10-CM | POA: Diagnosis not present

## 2014-12-11 DIAGNOSIS — Z9889 Other specified postprocedural states: Secondary | ICD-10-CM | POA: Diagnosis not present

## 2014-12-11 DIAGNOSIS — Z7902 Long term (current) use of antithrombotics/antiplatelets: Secondary | ICD-10-CM | POA: Diagnosis not present

## 2014-12-11 DIAGNOSIS — Z79899 Other long term (current) drug therapy: Secondary | ICD-10-CM | POA: Diagnosis not present

## 2014-12-11 DIAGNOSIS — E039 Hypothyroidism, unspecified: Secondary | ICD-10-CM | POA: Insufficient documentation

## 2014-12-11 DIAGNOSIS — Z720 Tobacco use: Secondary | ICD-10-CM | POA: Diagnosis not present

## 2014-12-11 DIAGNOSIS — F17210 Nicotine dependence, cigarettes, uncomplicated: Secondary | ICD-10-CM | POA: Diagnosis not present

## 2014-12-11 DIAGNOSIS — R0489 Hemorrhage from other sites in respiratory passages: Secondary | ICD-10-CM | POA: Insufficient documentation

## 2014-12-11 DIAGNOSIS — Z5181 Encounter for therapeutic drug level monitoring: Secondary | ICD-10-CM | POA: Diagnosis not present

## 2014-12-11 DIAGNOSIS — F17200 Nicotine dependence, unspecified, uncomplicated: Secondary | ICD-10-CM | POA: Diagnosis not present

## 2014-12-11 LAB — CBC WITH DIFFERENTIAL/PLATELET
Basophils Absolute: 0 10*3/uL (ref 0–0.1)
Basophils Relative: 1 %
EOS PCT: 2 %
Eosinophils Absolute: 0.1 10*3/uL (ref 0–0.7)
HEMATOCRIT: 38.7 % (ref 35.0–47.0)
Hemoglobin: 13.1 g/dL (ref 12.0–16.0)
LYMPHS ABS: 1.5 10*3/uL (ref 1.0–3.6)
LYMPHS PCT: 21 %
MCH: 31.5 pg (ref 26.0–34.0)
MCHC: 33.8 g/dL (ref 32.0–36.0)
MCV: 92.9 fL (ref 80.0–100.0)
MONO ABS: 0.5 10*3/uL (ref 0.2–0.9)
MONOS PCT: 6 %
NEUTROS ABS: 5.2 10*3/uL (ref 1.4–6.5)
Neutrophils Relative %: 70 %
PLATELETS: 310 10*3/uL (ref 150–440)
RBC: 4.16 MIL/uL (ref 3.80–5.20)
RDW: 12.8 % (ref 11.5–14.5)
WBC: 7.3 10*3/uL (ref 3.6–11.0)

## 2014-12-11 LAB — BRAIN NATRIURETIC PEPTIDE: B Natriuretic Peptide: 22 pg/mL (ref 0.0–100.0)

## 2014-12-11 LAB — COMPREHENSIVE METABOLIC PANEL
ALK PHOS: 75 U/L (ref 38–126)
ALT: 15 U/L (ref 14–54)
ANION GAP: 7 (ref 5–15)
AST: 16 U/L (ref 15–41)
Albumin: 4.4 g/dL (ref 3.5–5.0)
BILIRUBIN TOTAL: 0.4 mg/dL (ref 0.3–1.2)
BUN: 13 mg/dL (ref 6–20)
CALCIUM: 9.7 mg/dL (ref 8.9–10.3)
CO2: 26 mmol/L (ref 22–32)
CREATININE: 0.94 mg/dL (ref 0.44–1.00)
Chloride: 101 mmol/L (ref 101–111)
GFR calc non Af Amer: >60 mL/min (ref >60)
GLUCOSE: 93 mg/dL (ref 65–99)
Potassium: 4.4 mmol/L (ref 3.5–5.1)
Sodium: 134 mmol/L — ABNORMAL LOW (ref 135–145)
TOTAL PROTEIN: 8 g/dL (ref 6.5–8.1)

## 2014-12-11 LAB — APTT: APTT: 33 s (ref 24–36)

## 2014-12-11 LAB — PROTIME-INR
INR: 1.01
Prothrombin Time: 13.5 seconds (ref 11.4–15.0)

## 2014-12-11 LAB — TROPONIN I: Troponin I: <0.03 ng/mL (ref <0.031)

## 2014-12-11 MED ORDER — DIPHENHYDRAMINE HCL 25 MG PO TABS
50.0000 mg | ORAL_TABLET | Freq: Every evening | ORAL | Status: DC | PRN
  Filled 2014-12-11: qty 2

## 2014-12-11 MED ORDER — IOHEXOL 350 MG/ML SOLN
75.0000 mL | Freq: Once | INTRAVENOUS | Status: AC | PRN
  Administered 2014-12-11: 75 mL via INTRAVENOUS

## 2014-12-11 MED ORDER — ONDANSETRON HCL 4 MG/2ML IJ SOLN: 4.0000 mg | Freq: Four times a day (QID) | INTRAMUSCULAR | Status: DC | PRN

## 2014-12-11 MED ORDER — POLYETHYLENE GLYCOL 3350 17 G PO PACK
17.0000 g | PACK | Freq: Every day | ORAL | Status: DC | PRN
Start: 2014-12-11 — End: 2014-12-12

## 2014-12-11 MED ORDER — SODIUM CHLORIDE 0.9 % IV SOLN
INTRAVENOUS | Status: DC
Start: 2014-12-11 — End: 2014-12-12
  Administered 2014-12-11: 18:00:00 via INTRAVENOUS

## 2014-12-11 MED ORDER — ONDANSETRON HCL 4 MG PO TABS: 4.0000 mg | ORAL_TABLET | Freq: Four times a day (QID) | ORAL | Status: DC | PRN

## 2014-12-11 MED ORDER — PREDNISONE 50 MG PO TABS
50.0000 mg | ORAL_TABLET | Freq: Every day | ORAL | Status: DC
  Administered 2014-12-12: 50 mg via ORAL
  Filled 2014-12-11: qty 1

## 2014-12-11 MED ORDER — DOCUSATE SODIUM 100 MG PO CAPS
100.0000 mg | ORAL_CAPSULE | Freq: Two times a day (BID) | ORAL | Status: DC
  Administered 2014-12-11 – 2014-12-12 (×2): 100 mg via ORAL
  Filled 2014-12-11 (×2): qty 1

## 2014-12-11 MED ORDER — LISINOPRIL 20 MG PO TABS
20.0000 mg | ORAL_TABLET | Freq: Every day | ORAL | Status: DC
  Administered 2014-12-11 – 2014-12-12 (×2): 20 mg via ORAL
  Filled 2014-12-11 (×2): qty 1

## 2014-12-11 MED ORDER — ATORVASTATIN CALCIUM 20 MG PO TABS
20.0000 mg | ORAL_TABLET | Freq: Every day | ORAL | Status: DC
  Administered 2014-12-11: 20 mg via ORAL
  Filled 2014-12-11: qty 1

## 2014-12-11 MED ORDER — ACETAMINOPHEN 650 MG RE SUPP: 650.0000 mg | Freq: Four times a day (QID) | RECTAL | Status: DC | PRN

## 2014-12-11 MED ORDER — LEVOFLOXACIN 500 MG PO TABS
500.0000 mg | ORAL_TABLET | Freq: Every day | ORAL | Status: DC
  Administered 2014-12-11 – 2014-12-12 (×2): 500 mg via ORAL
  Filled 2014-12-11 (×2): qty 1

## 2014-12-11 MED ORDER — CYANOCOBALAMIN 500 MCG PO TABS
500.0000 ug | ORAL_TABLET | Freq: Every day | ORAL | Status: DC
  Administered 2014-12-11 – 2014-12-12 (×2): 500 ug via ORAL
  Filled 2014-12-11 (×3): qty 1

## 2014-12-11 MED ORDER — ACETAMINOPHEN 325 MG PO TABS: 650.0000 mg | ORAL_TABLET | Freq: Four times a day (QID) | ORAL | Status: DC | PRN

## 2014-12-11 MED ORDER — PNEUMOCOCCAL VAC POLYVALENT 25 MCG/0.5ML IJ INJ: 0.5000 mL | INJECTION | INTRAMUSCULAR | Status: DC

## 2014-12-11 MED ORDER — NICOTINE 21 MG/24HR TD PT24
21.0000 mg | MEDICATED_PATCH | Freq: Every day | TRANSDERMAL | Status: DC
  Administered 2014-12-11 – 2014-12-12 (×2): 21 mg via TRANSDERMAL
  Filled 2014-12-11 (×2): qty 1

## 2014-12-11 MED ORDER — LEVOTHYROXINE SODIUM 75 MCG PO TABS
75.0000 ug | ORAL_TABLET | Freq: Every day | ORAL | Status: DC
  Administered 2014-12-12: 75 ug via ORAL
  Filled 2014-12-11: qty 1

## 2014-12-11 NOTE — ED Notes (Signed)
Patient transported to CT 

## 2014-12-11 NOTE — ED Provider Notes (Signed)
[facility] Emergency Department Provider Note     Time seen: ----------------------------------------- 1:17 PM on 12/11/2014 -----------------------------------------    I have reviewed the triage vital signs and the nursing notes.   HISTORY  Chief Complaint Hemoptysis    HPI Mary Hoover is a 69 y.o. female who presents ER after coughing up blood. Patient states since 9:30 PM she's had episodes of coughing up frank blood. She has not been ill, has not had any upper respiratory cough or cold symptoms. Patient denies any fevers chills, denies chest pain or shortness of breath. Patient does not have a history of this, nothing makes her symptoms better or worse.   Past Medical History  Diagnosis Date  . Hyperlipidemia   . Hypertension   . Carotid atherosclerosis   . Carotid bruit   . Hypothyroidism   . Tobacco use   . COPD (chronic obstructive pulmonary disease) ([place])   . Infected cat bite 1980s    was hospitalized    Patient Active Problem List   Diagnosis Date Noted  . Hemoptysis 12/11/2014  . Medication monitoring encounter 11/25/2014  . Hyperlipidemia   . Hypertension   . Status post carotid endarterectomy   . Hypothyroidism   . Tobacco use   . COPD (chronic obstructive pulmonary disease) [place])     Past Surgical History  Procedure Laterality Date  . Incision and drainage / excision thyroglossal cyst  April 2011  . Carotid endarterectomy Right Oct. 2015    Dr. Lucky Cowboy  . Carotid stenosis Left April 2016    carotid stenosis surgery    Allergies Review of patient's allergies indicates no known allergies.  Social History Social History  Substance Use Topics  . Smoking status: Current Every Day Smoker -- 1.00 packs/day for 50 years    Types: Cigarettes  . Smokeless tobacco: Never Used  . Alcohol Use: Yes     Comment: occasional    Review of Systems Constitutional: Negative for fever. Eyes: Negative for visual  changes. ENT: Negative for sore throat. Cardiovascular: Negative for chest pain. Respiratory: Negative for shortness of breath. Positive for hemoptysis Gastrointestinal: Negative for abdominal pain, vomiting and diarrhea. Genitourinary: Negative for dysuria. Musculoskeletal: Negative for back pain. Skin: Negative for rash. Neurological: Negative for headaches, focal weakness or numbness.  10-point ROS otherwise negative.  ____________________________________________   PHYSICAL EXAM:  VITAL SIGNS: ED Triage Vitals  Enc Vitals Group     BP 12/11/14 1251 153/69 mmHg     Pulse Rate 12/11/14 1251 87     Resp 12/11/14 1251 18     Temp 12/11/14 1251 97.8 F (36.6 C)     Temp Source 12/11/14 1251 Oral     SpO2 12/11/14 1251 100 %     Weight 12/11/14 1251 136 lb (61.689 kg)     Height 12/11/14 1251 '5\' 8"'$  (1.727 m)     Head Cir --      Peak Flow --      Pain Score 12/11/14 1255 0     Pain Loc --      Pain Edu? --      Excl. in [place]? --     Constitutional: Alert and oriented. Well appearing and in no distress. Eyes: Conjunctivae are normal. PERRL. Normal extraocular movements. ENT   Head: Normocephalic and atraumatic.   Nose: No congestion/rhinnorhea.   Mouth/Throat: Mucous membranes are moist.   Neck: No stridor. Cardiovascular: Normal rate, regular rhythm. Normal and symmetric distal pulses are present in all  extremities. No murmurs, rubs, or gallops. Respiratory: Normal respiratory effort without tachypnea nor retractions. Breath sounds are clear and equal bilaterally. No wheezes/rales/rhonchi. Gastrointestinal: Soft and nontender. No distention. No abdominal bruits.  Musculoskeletal: Nontender with normal range of motion in all extremities. No joint effusions.  No lower extremity tenderness nor edema. Neurologic:  Normal speech and language. No gross focal neurologic deficits are appreciated. Speech is normal. No gait instability. Skin:  Skin is warm, dry and  intact. No rash noted. Psychiatric: Mood and affect are normal. Speech and behavior are normal. Patient exhibits appropriate insight and judgment. ____________________________________________  ED COURSE:  Pertinent labs & imaging results that were available during my care of the patient were reviewed by me and considered in my medical decision making (see chart for details). Patient is in no acute distress, however has significant frank hemoptysis in the room. She will need CT angiogram and basic lab work. ____________________________________________    Reva Bores (pertinent positives/negatives)  Labs Reviewed  COMPREHENSIVE METABOLIC PANEL - Abnormal; Notable for the following:    Sodium 134 (*)    All other components within normal limits  CBC WITH DIFFERENTIAL/PLATELET  BRAIN NATRIURETIC PEPTIDE  TROPONIN I  PROTIME-INR    RADIOLOGY Images were viewed by me  CT angiogram IMPRESSION: Changes in the left upper lobe posteriorly consistent with localized parenchymal hemorrhage. The etiology of this is uncertain as no definitive mass lesion is noted at this time. Direct visualization may be helpful.  Emphysematous changes  No evidence of pulmonary emboli or aortic dissection. ____________________________________________  FINAL ASSESSMENT AND PLAN  Hemoptysis  Plan: Patient with labs and imaging as dictated above. Patient with changes in the left upper lobe consistent with localized hemorrhage. She will need to be admitted that she is still having frank hemoptysis. Will discuss with pulmonology.   Earleen Newport, MD   Earleen Newport, MD 12/11/14 [phone]

## 2014-12-11 NOTE — Assessment & Plan Note (Signed)
Due to come in on Monday and have labs for Dr. Sanda Klein, will release them today to save a stick.

## 2014-12-11 NOTE — ED Notes (Signed)
States last night at 2130 coughed up some blood and has continued to cough up blood this morning.  Sent to ED for evaluation from PCP's office.  Denies C/O pain or SOB

## 2014-12-11 NOTE — Patient Instructions (Signed)
Hemoptysis  Hemoptysis, which means coughing up blood, can be a sign of a minor problem or a serious medical condition. The blood that is coughed up may come from the lungs and airways. Coughed-up blood can also come from bleeding that occurs outside the lungs and airways. Blood can drain into the windpipe during a severe nosebleed or when blood is vomited from the stomach. Because hemoptysis can be a sign of something serious, a medical evaluation is required. For some people with hemoptysis, no definite cause is ever identified.  CAUSES   The most common cause of hemoptysis is bronchitis. Some other common causes include:   · A ruptured blood vessel caused by coughing or an infection.    · A medical condition that causes damage to the large air passageways (bronchiectasis).    · A blood clot in the lungs (pulmonary embolism).    · Pneumonia.    · Tuberculosis.    · Breathing in a small foreign object.    · Cancer.  For some people with hemoptysis, no definite cause is ever identified.    HOME CARE INSTRUCTIONS  · Only take over-the-counter or prescription medicines as directed by your caregiver. Do not use cough suppressants unless your caregiver approves.  · If your caregiver prescribes antibiotic medicines, take them as directed. Finish them even if you start to feel better.  · Do not smoke. Also avoid secondhand smoke.  · Follow up with your caregiver as directed.  SEEK IMMEDIATE MEDICAL CARE IF:   · You cough up bloody mucus for longer than a week.  · You have a blood-producing cough that is severe or getting worse.  · You have a blood-producing cough that comes and goes over time.  · You develop problems with your breathing.    · You vomit blood.  · You develop bloody or black-colored stools.  · You have chest pain.    · You develop night sweats.  · You feel faint or pass out.    · You have a fever or persistent symptoms for more than 2-3 days.    · You have a fever and your symptoms suddenly get worse.  MAKE  SURE YOU:  · Understand these instructions.  · Will watch your condition.  · Will get help right away if you are not doing well or get worse.     This information is not intended to replace advice given to you by your health care provider. Make sure you discuss any questions you have with your health care provider.     Document Released: 03/27/2001 Document Revised: 01/03/2012 Document Reviewed: 11/03/2011  Elsevier Interactive Patient Education ©2016 Elsevier Inc.

## 2014-12-11 NOTE — H&P (Signed)
[facility] NAME: Mary Hoover    MR#:  [id number]  DATE OF BIRTH:  10/17/1945  DATE OF ADMISSION:  12/11/2014  PRIMARY CARE PHYSICIAN: Enid Derry, MD   REQUESTING/REFERRING PHYSICIAN: Dr. Lenise Arena  CHIEF COMPLAINT:   Chief Complaint  Patient presents with  . Hemoptysis    HISTORY OF PRESENT ILLNESS:  Mary Hoover  is a 69 y.o. female with a known history of COPD not on any home oxygen, ongoing smoking, hypertension hyperlipidemia presents to the hospital secondary to hemoptysis. Patient denies any recent upper respiratory tract infection, she hasn't had a coughing spell, last night she had the spontaneous cough associated with hemoptysis that she used to take shoe and was saturated with bright red blood. She says it's minimal amount, less than one fourth of a cough. She had one more of that episode last night. This morning she woke up and had a bad coughing spell following which she had again hemoptysis with clot in it. She had 4 of these  episodes at home and presented to the emergency room. She is very stable at this time, her sats are stable. While talking she coughs and is spitting up blood. Patient takes aspirin and plavix at home. CT of the chest showing significant emphysematous changes. Also in the left upper lobe changes consistent with localized hemorrhage without any definite mass noted. Her hemoglobin is stable. Patient is being admitted for observation of the same.  PAST MEDICAL HISTORY:   Past Medical History  Diagnosis Date  . Hyperlipidemia   . Hypertension   . Carotid atherosclerosis   . Carotid bruit   . Hypothyroidism   . Tobacco use   . COPD (chronic obstructive pulmonary disease) (HCC)     emphysema  . Infected cat bite 1980s    was hospitalized    PAST SURGICAL HISTORY:   Past Surgical History  Procedure Laterality Date  . Incision and drainage / excision thyroglossal cyst   April 2011  . Carotid endarterectomy Right Oct. 2015    Dr. Lucky Cowboy  . Carotid stenosis Left April 2016    carotid stenosis surgery    SOCIAL HISTORY:   Social History  Substance Use Topics  . Smoking status: Current Every Day Smoker -- 1.50 packs/day for 50 years    Types: Cigarettes  . Smokeless tobacco: Never Used  . Alcohol Use: 0.0 oz/week    0 Standard drinks or equivalent per week     Comment: occasional    FAMILY HISTORY:   Family History  Problem Relation Age of Onset  . Congestive Heart Failure Mother   . Heart disease Mother   . Hypertension Mother   . Hypertension Sister   . Diabetes Sister   . Heart disease Sister   . Cancer Brother     liver, lung  . Heart disease Maternal Grandmother   . Heart disease Maternal Uncle   . Stroke Maternal Grandfather   . COPD Neg Hx   . Heart disease Brother   . Hypertension Brother     DRUG ALLERGIES:  No Known Allergies  REVIEW OF SYSTEMS:   Review of Systems  Constitutional: Negative for fever, chills, weight loss and malaise/fatigue.  HENT: Negative for ear discharge, ear pain, hearing loss, nosebleeds and tinnitus.   Eyes: Negative for blurred vision, double vision and photophobia.  Respiratory: Positive for cough and hemoptysis. Negative for shortness of breath and wheezing.   Cardiovascular: Negative for  chest pain, palpitations, orthopnea and leg swelling.  Gastrointestinal: Negative for heartburn, nausea, vomiting, abdominal pain, diarrhea, constipation and melena.  Genitourinary: Negative for dysuria, urgency, frequency and hematuria.  Musculoskeletal: Negative for myalgias, back pain and neck pain.  Skin: Negative for rash.  Neurological: Negative for dizziness, tingling, tremors, sensory change, speech change, focal weakness and headaches.  Endo/Heme/Allergies: Does not bruise/bleed easily.  Psychiatric/Behavioral: Negative for depression.    MEDICATIONS AT HOME:   Prior to Admission medications    Medication Sig Start Date End Date Taking? Authorizing Provider  aspirin EC 81 MG tablet Take 81 mg by mouth daily.   Yes Historical Provider, MD  atorvastatin (LIPITOR) 20 MG tablet Take 20 mg by mouth at bedtime.   Yes Historical Provider, MD  clopidogrel (PLAVIX) 75 MG tablet Take 75 mg by mouth daily.   Yes Historical Provider, MD  diphenhydrAMINE (BENADRYL) 25 MG tablet Take 50 mg by mouth at bedtime as needed for sleep.   Yes Historical Provider, MD  levothyroxine (SYNTHROID, LEVOTHROID) 75 MCG tablet Take 75 mcg by mouth daily.    Yes Historical Provider, MD  lisinopril (PRINIVIL,ZESTRIL) 20 MG tablet Take 1 tablet (20 mg total) by mouth daily. 11/25/14  Yes Arnetha Courser, MD  vitamin B-12 (CYANOCOBALAMIN) 500 MCG tablet Take 500 mcg by mouth daily.   Yes Historical Provider, MD      VITAL SIGNS:  Blood pressure 160/67, pulse 70, temperature 97.8 F (36.6 C), temperature source Oral, resp. rate 20, height '5\' 8"'$  (1.727 m), weight 61.689 kg (136 lb), SpO2 98 %.  PHYSICAL EXAMINATION:   Physical Exam  GENERAL:  69 y.o.-year-old patient lying in the bed with no acute distress.  EYES: Pupils equal, round, reactive to light and accommodation. No scleral icterus. Extraocular muscles intact.  HEENT: Head atraumatic, normocephalic. Oropharynx and nasopharynx clear.  NECK:  Supple, no jugular venous distention. No thyroid enlargement, no tenderness.  LUNGS: Normal breath sounds bilaterally, no wheezing, rales,rhonchi or crepitation. No use of accessory muscles of respiration. CARDIOVASCULAR: S1, S2 normal. No rubs, or gallops. 3/6 systolic murmur is present ABDOMEN: Soft, nontender, nondistended. Bowel sounds present. No organomegaly or mass.  EXTREMITIES: No pedal edema, cyanosis, or clubbing.  NEUROLOGIC: Cranial nerves II through XII are intact. Muscle strength 5/5 in all extremities. Sensation intact. Gait not checked.  PSYCHIATRIC: The patient is alert and oriented x 3.  SKIN: No  obvious rash, lesion, or ulcer.   LABORATORY PANEL:   CBC  Recent Labs Lab 12/11/14 1301  WBC 7.3  HGB 13.1  HCT 38.7  PLT 310   ------------------------------------------------------------------------------------------------------------------  Chemistries   Recent Labs Lab 12/11/14 1301  NA 134*  K 4.4  CL 101  CO2 26  GLUCOSE 93  BUN 13  CREATININE 0.94  CALCIUM 9.7  AST 16  ALT 15  ALKPHOS 75  BILITOT 0.4   ------------------------------------------------------------------------------------------------------------------  Cardiac Enzymes  Recent Labs Lab 12/11/14 1301  TROPONINI <0.03   ------------------------------------------------------------------------------------------------------------------  RADIOLOGY:  Ct Angio Chest Pe W/cm &/or Wo Cm  12/11/2014  CLINICAL DATA:  Hemoptysis for more than 12 hours EXAM: CT ANGIOGRAPHY CHEST WITH CONTRAST TECHNIQUE: Multidetector CT imaging of the chest was performed using the standard protocol during bolus administration of intravenous contrast. Multiplanar CT image reconstructions and MIPs were obtained to evaluate the vascular anatomy. CONTRAST:  24m OMNIPAQUE IOHEXOL 350 MG/ML SOLN COMPARISON:  05/05/2014 09/02/2013 FINDINGS: The lungs are well aerated bilaterally and demonstrate significant emphysematous changes. The right lung demonstrates apical scarring  although no infiltrate or sizable parenchymal nodule is noted. Scarring is also noted within the left apex as well as a confluent area of density which radiates from the left suprahilar region in a somewhat wedge-shaped form. Given the patient's clinical history of hemoptysis this would be consistent with localized hemorrhage no definitive mass is seen although would be difficult to exclude a small lesion given the surrounding hemorrhage. No definitive endobronchial lesion is seen. No other focal parenchymal abnormality is noted. The thoracic inlet is within normal  limits. The thoracic aorta shows a normal branching pattern. No pulmonary emboli are seen. No definitive bronchial supply to this area is noted. No mediastinal adenopathy is seen. A few small right hilar lymph nodes are noted. Mild coronary calcifications are seen. The visualized upper abdomen shows no acute abnormality. The bony structures are within normal limits. Review of the MIP images confirms the above findings. IMPRESSION: Changes in the left upper lobe posteriorly consistent with localized parenchymal hemorrhage. The etiology of this is uncertain as no definitive mass lesion is noted at this time. Direct visualization may be helpful. Emphysematous changes No evidence of pulmonary emboli or aortic dissection. Electronically Signed   By: Inez Catalina M.D.   On: 12/11/2014 14:59    EKG:   Orders placed or performed during the hospital encounter of 12/11/14  . ED EKG  . ED EKG    IMPRESSION AND PLAN:   Mary Hoover  is a 69 y.o. female with a known history of COPD not on any home oxygen, ongoing smoking, hypertension hyperlipidemia presents to the hospital secondary to hemoptysis.  #1 hemoptysis with pulmonary hemorrhage-likely acute bronchitis with her underlying COPD. -Discussed with pulmonology. Consult placed. -Observe in the hospital. If no worsening of bleeding, bronchoscopy can be done as an outpatient. -Start oral prednisone and Levaquin for bronchitis.  #2 COPD with emphysema-no asked to exacerbation. Not on any home oxygen. -Monitor. Could benefit from inhalers at discharge.  #3 tobacco abuse disorder-patient smokes up to a pack and half every day. She is being counseled against smoking for greater than 3 minutes. -Starting on nicotine patch  #4 Carotid artery stenosis status post endarterectomy-hold aspirin and Plavix for now.  #5 hypothyroidism-continue Synthroid  #6 hypertension- continue outpt meds  #7 DVT prophylaxis-Ted's and SCDs    All the records are  reviewed and case discussed with ED provider. Management plans discussed with the patient, family and they are in agreement.  CODE STATUS: Full Code  TOTAL TIME TAKING CARE OF THIS PATIENT: 50  minutes.    Gladstone Lighter M.D on 12/11/2014 at 4:32 PM  Between 7am to 6pm - Pager - [phone]  After 6pm go to [email or web address] - password [email or web address] [facility]  Office  [phone]  CC: Primary care physician; Enid Derry, MD

## 2014-12-11 NOTE — Assessment & Plan Note (Signed)
No sign of upper respiratory tract bleeding. No wheezing, no cold symptoms. No indication of bronchitis. Significant and heavy smoking history. Significant concern for lung carcinoma. Will check CBC and quantiferon to look for possible TB, will check CXR to look for possible masses. Will have very low threshold to move forward with a CT. Normal sats- will not check d. Dimer as very low suspicion for PE.

## 2014-12-11 NOTE — Progress Notes (Signed)
BP 146/77 mmHg  Pulse 73  Temp(Src) 97.2 F (36.2 C)  Wt 136 lb (61.689 kg)  SpO2 98%   Subjective:    Patient ID: Mary Hoover, female    DOB: 1945-07-20, 69 y.o.   MRN: [id number]  HPI: Mary Hoover is a 69 y.o. female  Chief Complaint  Patient presents with  . Cough    She started coughing last night and has been coughing up blood. no other symptoms, no fever, no chest congestion.   COUGH- started coughing up blood last night, coughed up again this morning, has a little bit of sinus drainage but otherwise feeling fine Duration: 1 day Circumstances of initial development of cough: no other side effects Cough severity: mild Cough description: little bit  Aggravating factors: unknown Alleviating factors: nothing Status: stable Treatments attempted: benadryl, then went to bed Wheezing: no Shortness of breath: no Chest pain: no Chest tightness: no Nasal congestion: yes Runny nose: yes Postnasal drip: yes Frequent throat clearing or swallowing: no Hemoptysis: yes Fevers: no Night sweats: no Weight loss: yes- off an on, nothing recently Heartburn: no Recent foreign travel: no Tuberculosis contacts: no Smokes: 1-1.5ppd for 54-55 years, has increased over the years. Quit for about 6 months and then went back to it ENT found polyps about 6 months ago, has had blockages in her carotid  Relevant past medical, surgical, family and social history reviewed and updated as indicated. Interim medical history since our last visit reviewed. Allergies and medications reviewed and updated.  Review of Systems  Constitutional: Negative.   HENT: Negative.   Respiratory: Negative.   Cardiovascular: Negative.   Psychiatric/Behavioral: Negative.    Per HPI unless specifically indicated above    Objective:    BP 146/77 mmHg  Pulse 73  Temp(Src) 97.2 F (36.2 C)  Wt 136 lb (61.689 kg)  SpO2 98%  Wt Readings from Last 3 Encounters:  12/11/14 136 lb (61.689 kg)   11/25/14 136 lb (61.689 kg)  06/25/14 138 lb (62.596 kg)    Physical Exam  Constitutional: She is oriented to person, place, and time. She appears well-developed and well-nourished. No distress.  HENT:  Head: Normocephalic and atraumatic.  Right Ear: Hearing and external ear normal.  Left Ear: Hearing and external ear normal.  Nose: Nose normal. No mucosal edema, rhinorrhea or nose lacerations. No epistaxis.  No foreign bodies.  Mouth/Throat: Oropharynx is clear and moist. Mucous membranes are not pale, not dry and not cyanotic. No oropharyngeal exudate, posterior oropharyngeal edema, posterior oropharyngeal erythema or tonsillar abscesses.  No sign of bleeding or blood in the upper airway  Eyes: Conjunctivae and lids are normal. Right eye exhibits no discharge. Left eye exhibits no discharge. No scleral icterus.  Neck: Normal range of motion. Neck supple. No JVD present. No tracheal deviation present. No thyromegaly present.  Cardiovascular: Normal rate, regular rhythm, normal heart sounds and intact distal pulses.  Exam reveals no gallop.   No murmur heard. Pulmonary/Chest: Effort normal. No stridor. No respiratory distress. She has decreased breath sounds in the right upper field, the right middle field, the right lower field, the left upper field, the left middle field and the left lower field. She has no wheezes. She has no rhonchi. She has no rales.  Musculoskeletal: Normal range of motion.  Lymphadenopathy:    She has no cervical adenopathy.  Neurological: She is alert and oriented to person, place, and time.  Skin: Skin is warm, dry and intact. No rash noted. No  erythema. No pallor.  Psychiatric: She has a normal mood and affect. Her speech is normal and behavior is normal. Judgment and thought content normal. Cognition and memory are normal.  Nursing note and vitals reviewed.   Results for orders placed or performed during the hospital encounter of 05/13/14  CBC with  Differential/Platelet  Result Value Ref Range   WBC 8.6 3.6-11.0 x10 3/mm 3   RBC 3.56 (L) 3.80-5.20 X10 6/mm 3   HGB 11.2 (L) 12.0-16.0 g/dL   HCT 33.2 (L) 35.0-47.0 %   MCV 93 80-100 fL   MCH 31.4 26.0-34.0 pg   MCHC 33.7 32.0-36.0 g/dL   RDW 13.7 11.5-14.5 %   Platelet 235 150-440 x10 3/mm 3   Neutrophil % 75.4 %   Lymphocyte % 15.2 %   Monocyte % 7.8 %   Eosinophil % 1.3 %   Basophil % 0.3 %   Neutrophil # 6.5 1.4-6.5 x10 3/mm 3   Lymphocyte # 1.3 1.0-3.6 x10 3/mm 3   Monocyte # 0.7 0.2-0.9 x10 3/mm    Eosinophil # 0.1 0.0-0.7 x10 3/mm 3   Basophil # 0.0 0.0-0.1 x10 3/mm 3  Basic metabolic panel  Result Value Ref Range   Glucose, CSF DNP mg/dL   BUN 11 mg/dL   Creatinine 0.78 mg/dL   Sodium, Urine Random DNP mmol/L   Potassium, Urine Random DNP mmol/L   Chloride, Urine Random DNP mmol/L   Co2 27 mmol/L   Calcium, Total 8.7 (L) mg/dL   Anion Gap 6 (L) 7-16   EGFR (African American) >60    EGFR (Non-African Amer.) >60    Glucose 100 (H) mg/dL   Sodium 129 (L) mmol/L   Potassium 4.3 mmol/L   Chloride 96 (L) mmol/L  Protime-INR  Result Value Ref Range   Prothrombin Time 13.5 secs   INR 1.0   APTT  Result Value Ref Range   Activated PTT 35.6 23.6-35.9 secs  Surgical pathology  Result Value Ref Range   SURGICAL PATHOLOGY      Surgical Procedure CASE: [id number] PATIENT: Kaili Gwynne Surgical Pathology Report     SPECIMEN SUBMITTED: A. Carotid plaque, left  CLINICAL HISTORY: None provided  PRE-OPERATIVE DIAGNOSIS: Left carotid artery stenosis  POST-OPERATIVE DIAGNOSIS: Same     DIAGNOSIS: A. LEFT CAROTID ARTERY; ENDARTERECTOMY: - CALCIFIED ATHEROSCLEROTIC PLAQUE.   GROSS DESCRIPTION: A. Labeled: Left carotid plaque Tissue Fragment(s): 1 Measurement: 3.2 x 1.0 x 0.5 cm Comment: Pink to tan with focal calcification Representative submitted in cassette(s): 1 following decalcification   Final Diagnosis performed by Bryan Lemma, MD.   Electronically signed 05/14/2014 6:36:43PM    The electronic signature indicates that the named Attending Pathologist has evaluated the specimen  Technical component performed at [facility], [address], [place], [place] [address] Lab: [phone] Dir: Darrick Penna. Evette Doffing, MD  Professional component performed at [facility], [facility], [place], [place], [place] [address] Lab: [phone] Dir: Dellia Nims. Reuel Derby, MD        Assessment & Plan:   Problem List Items Addressed This Visit      Respiratory   Hemoptysis - Primary    No sign of upper respiratory tract bleeding. No wheezing, no cold symptoms. No indication of bronchitis. Significant and heavy smoking history. Significant concern for lung carcinoma. Will check CBC and quantiferon to look for possible TB, will check CXR to look for possible masses. Will have very low threshold to move forward with a CT. Normal sats- will not check d.  Dimer as very low suspicion for PE.       Relevant Orders   Quantiferon tb gold assay (blood)   DG Chest 2 View     Other   Hyperlipidemia    Due to come in on Monday and have labs for Dr. Sanda Klein, will release them today to save a stick.      Medication monitoring encounter    Due to come in on Monday and have labs for Dr. Sanda Klein, will release them today to save a stick.          Follow up plan: Return Pending results. Marland Kitchen

## 2014-12-11 NOTE — ED Notes (Signed)
Pt arrives with complaints of coughing up blood since 9:30 pm last night, pt denies any trauma to her chest or fall, pt denies any pain, MD at bedside, pt productive sputum frank blood

## 2014-12-12 DIAGNOSIS — R042 Hemoptysis: Secondary | ICD-10-CM

## 2014-12-12 DIAGNOSIS — I10 Essential (primary) hypertension: Secondary | ICD-10-CM | POA: Diagnosis not present

## 2014-12-12 DIAGNOSIS — R911 Solitary pulmonary nodule: Secondary | ICD-10-CM

## 2014-12-12 DIAGNOSIS — J439 Emphysema, unspecified: Secondary | ICD-10-CM

## 2014-12-12 DIAGNOSIS — Z720 Tobacco use: Secondary | ICD-10-CM

## 2014-12-12 DIAGNOSIS — R0489 Hemorrhage from other sites in respiratory passages: Secondary | ICD-10-CM | POA: Diagnosis not present

## 2014-12-12 LAB — CBC WITH DIFFERENTIAL/PLATELET
BASOS: 0 %
Basophils Absolute: 0 10*3/uL (ref 0.0–0.2)
EOS (ABSOLUTE): 0.1 10*3/uL (ref 0.0–0.4)
Eos: 2 %
Hematocrit: 37.2 % (ref 34.0–46.6)
Hemoglobin: 12.6 g/dL (ref 11.1–15.9)
Immature Grans (Abs): 0 10*3/uL (ref 0.0–0.1)
Immature Granulocytes: 0 %
LYMPHS ABS: 1.3 10*3/uL (ref 0.7–3.1)
Lymphs: 20 %
MCH: 31.1 pg (ref 26.6–33.0)
MCHC: 33.9 g/dL (ref 31.5–35.7)
MCV: 92 fL (ref 79–97)
MONOS ABS: 0.5 10*3/uL (ref 0.1–0.9)
Monocytes: 7 %
Neutrophils Absolute: 4.7 10*3/uL (ref 1.4–7.0)
Neutrophils: 71 %
PLATELETS: 332 10*3/uL (ref 150–379)
RBC: 4.05 x10E6/uL (ref 3.77–5.28)
RDW: 13 % (ref 12.3–15.4)
WBC: 6.7 10*3/uL (ref 3.4–10.8)

## 2014-12-12 LAB — COMPREHENSIVE METABOLIC PANEL
ALT: 10 IU/L (ref 0–32)
AST: 15 IU/L (ref 0–40)
Albumin/Globulin Ratio: 1.7 (ref 1.1–2.5)
Albumin: 4.4 g/dL (ref 3.6–4.8)
Alkaline Phosphatase: 83 IU/L (ref 39–117)
BUN/Creatinine Ratio: 14 (ref 11–26)
BUN: 13 mg/dL (ref 8–27)
Bilirubin Total: 0.4 mg/dL (ref 0.0–1.2)
CO2: 22 mmol/L (ref 18–29)
Calcium: 9.8 mg/dL (ref 8.7–10.3)
Chloride: 94 mmol/L — ABNORMAL LOW (ref 97–106)
Creatinine, Ser: 0.92 mg/dL (ref 0.57–1.00)
GFR calc Af Amer: 75 mL/min/{1.73_m2} (ref >59)
GFR, EST NON AFRICAN AMERICAN: 65 mL/min/{1.73_m2} (ref >59)
GLOBULIN, TOTAL: 2.6 g/dL (ref 1.5–4.5)
Glucose: 85 mg/dL (ref 65–99)
Potassium: 5.3 mmol/L — ABNORMAL HIGH (ref 3.5–5.2)
SODIUM: 134 mmol/L — AB (ref 136–144)
TOTAL PROTEIN: 7 g/dL (ref 6.0–8.5)

## 2014-12-12 LAB — CBC
HEMATOCRIT: 33.4 % — AB (ref 35.0–47.0)
HEMOGLOBIN: 11.8 g/dL — AB (ref 12.0–16.0)
MCH: 32.8 pg (ref 26.0–34.0)
MCHC: 35.4 g/dL (ref 32.0–36.0)
MCV: 92.7 fL (ref 80.0–100.0)
PLATELETS: 255 10*3/uL (ref 150–440)
RBC: 3.6 MIL/uL — ABNORMAL LOW (ref 3.80–5.20)
RDW: 12.9 % (ref 11.5–14.5)
WBC: 7.5 10*3/uL (ref 3.6–11.0)

## 2014-12-12 LAB — BASIC METABOLIC PANEL
ANION GAP: 7 (ref 5–15)
BUN: 12 mg/dL (ref 6–20)
CALCIUM: 8.8 mg/dL — AB (ref 8.9–10.3)
CO2: 24 mmol/L (ref 22–32)
Chloride: 106 mmol/L (ref 101–111)
Creatinine, Ser: 0.74 mg/dL (ref 0.44–1.00)
GFR calc Af Amer: >60 mL/min (ref >60)
GLUCOSE: 91 mg/dL (ref 65–99)
Potassium: 4 mmol/L (ref 3.5–5.1)
Sodium: 137 mmol/L (ref 135–145)

## 2014-12-12 LAB — LIPID PANEL W/O CHOL/HDL RATIO
Cholesterol, Total: 137 mg/dL (ref 100–199)
HDL: 70 mg/dL (ref >39)
LDL Calculated: 50 mg/dL (ref 0–99)
Triglycerides: 83 mg/dL (ref 0–149)
VLDL Cholesterol Cal: 17 mg/dL (ref 5–40)

## 2014-12-12 MED ORDER — METHYLPREDNISOLONE 4 MG PO TBPK: ORAL_TABLET | ORAL | Status: DC

## 2014-12-12 MED ORDER — IPRATROPIUM-ALBUTEROL 0.5-2.5 (3) MG/3ML IN SOLN
3.0000 mL | RESPIRATORY_TRACT | Status: DC
  Filled 2014-12-12: qty 3

## 2014-12-12 MED ORDER — LEVOFLOXACIN 500 MG PO TABS: 500.0000 mg | ORAL_TABLET | Freq: Every day | ORAL | Status: DC

## 2014-12-12 MED ORDER — IPRATROPIUM-ALBUTEROL 0.5-2.5 (3) MG/3ML IN SOLN: 3.0000 mL | RESPIRATORY_TRACT | Status: DC

## 2014-12-12 MED ORDER — BUDESONIDE-FORMOTEROL FUMARATE 160-4.5 MCG/ACT IN AERO
2.0000 | INHALATION_SPRAY | Freq: Two times a day (BID) | RESPIRATORY_TRACT | Status: DC
  Filled 2014-12-12: qty 6

## 2014-12-12 MED ORDER — NICOTINE 21 MG/24HR TD PT24: 21.0000 mg | MEDICATED_PATCH | Freq: Every day | TRANSDERMAL | Status: DC

## 2014-12-12 MED ORDER — BUDESONIDE-FORMOTEROL FUMARATE 160-4.5 MCG/ACT IN AERO: 2.0000 | INHALATION_SPRAY | Freq: Two times a day (BID) | RESPIRATORY_TRACT | Status: DC

## 2014-12-12 NOTE — Consult Note (Signed)
PULMONARY/CCM CONSULT NOTE  Requesting MD/Service: Hospitalist/[place] Date of admission: 11/11 Date of consult: 11/12 Reason for consultation: hemoptysis  HPI:  63 F smoker of 1.5 to 2 PPD admitted 11/11 with hemoptysis. She developed mild hemoptysis on the morning of admission and was seen by her PCP. A CXR was ordered. At the time of the CXR, the hemoptysis had worsened and she was directed to the ED for admission. A CT chest was performed with the findings as below.   She has never had hemoptysis previously. She denies fever, purulent sputum, LE edema, calf tenderness. She has no constitutional symptoms such as weight loss. Presently, she has no dyspnea or CP. She continues to have mild hemoptysis which is demonstrated only with forced cough. She has no history of TB exposure or positive skin test  Past Medical History  Diagnosis Date  . Hyperlipidemia   . Hypertension   . Carotid atherosclerosis   . Carotid bruit   . Hypothyroidism   . Tobacco use   . COPD (chronic obstructive pulmonary disease) (HCC)     emphysema  . Infected cat bite 1980s    was hospitalized   Past Surgical History  Procedure Laterality Date  . Incision and drainage / excision thyroglossal cyst  April 2011  . Carotid endarterectomy Right Oct. 2015    Dr. Lucky Cowboy  . Carotid stenosis Left April 2016    carotid stenosis surgery    MEDICATIONS: reviewed. Includes asa and clopidogrel   Social History   Social History  . Marital Status: Married    Spouse Name: N/A  . Number of Children: N/A  . Years of Education: N/A   Occupational History  . Not on file.   Social History Main Topics  . Smoking status: Current Every Day Smoker -- 1.50 packs/day for 50 years    Types: Cigarettes  . Smokeless tobacco: Never Used  . Alcohol Use: 0.0 oz/week    0 Standard drinks or equivalent per week     Comment: occasional  . Drug Use: No  . Sexual Activity: Not on file   Other Topics Concern  . Not on file    Social History Narrative   Lives at home with husband    Family History  Problem Relation Age of Onset  . Congestive Heart Failure Mother   . Heart disease Mother   . Hypertension Mother   . Hypertension Sister   . Diabetes Sister   . Heart disease Sister   . Cancer Brother     liver, lung  . Heart disease Maternal Grandmother   . Heart disease Maternal Uncle   . Stroke Maternal Grandfather   . COPD Neg Hx   . Heart disease Brother   . Hypertension Brother     ROS - Negative except as above  Filed Vitals:   12/11/14 1630 12/11/14 1704 12/11/14 2112 12/12/14 0511  BP: 162/64 146/70 159/62 131/57  Pulse: 74 66 74 82  Temp:  97.7 F (36.5 C) 97.5 F (36.4 C) 97.8 F (36.6 C)  TempSrc:  Oral Oral Oral  Resp: '19 20 18 16  '$ Height:      Weight:      SpO2: 97% 100% 99% 100%    EXAM:  Gen: NAD, appears older than stated age HEENT: NCAT, EOMI, PERRL Neck: No LAN Lungs: slightly coarse throughout. No wheezes Cardiovascular: Reg, no M Abdomen: Soft, NT, +BS Ext: no C/C/E Neuro: no focal deficits  DATA:  BMP Latest Ref Rng 12/12/2014 12/11/2014  12/11/2014  Glucose 65 - 99 mg/dL 91 93 85  BUN 6 - 20 mg/dL '12 13 13  '$ Creatinine 0.44 - 1.00 mg/dL 0.74 0.94 0.92  BUN/Creat Ratio 11 - 26 - - 14  Sodium 135 - 145 mmol/L 137 134(L) 134(L)  Potassium 3.5 - 5.1 mmol/L 4.0 4.4 5.3(H)  Chloride 101 - 111 mmol/L 106 101 94(L)  CO2 22 - 32 mmol/L '24 26 22  '$ Calcium 8.9 - 10.3 mg/dL 8.8(L) 9.7 9.8    CBC Latest Ref Rng 12/12/2014 12/11/2014 12/11/2014  WBC 3.6 - 11.0 K/uL 7.5 7.3 6.7  Hemoglobin 12.0 - 16.0 g/dL 11.8(L) 13.1 -  Hematocrit 35.0 - 47.0 % 33.4(L) 38.7 37.2  Platelets 150 - 440 K/uL 255 310 -   Coags normal  CT chest: moderate emphysema, LUL opacity with nodularity  IMPRESSION:   COPD, emphysema Smoker Hemoptysis - likely due to LUL process. Also possibly acute bronchitis LUL opacity - given the nodular appearance, I am concerned about malignancy. TB  would be possible but less likely  PLAN:  I have spoken with Dr Judeen Hammans  OK for discharge home today Would completed levofloxacin X 7 days Prednisone 40 mg per day X 5 days. No taper needed Hold ASA and Plavix in anticipation of bronchoscopy towards end of week I will have my office call her to arrange for quantiferon gold (which cannot be done on WEs her) on Mon My office will arrange for outpt bronchoscopy Thurs 11/17 or Fri 11/18  PCCM will sign off. Please call if we can be of further assistance  Merton Border, MD PCCM service Mobile [phone] Pager [phone]

## 2014-12-12 NOTE — Progress Notes (Signed)
[facility] [place] NAME: Mary Hoover    MR#:  [id number]  DATE OF BIRTH:  [birth year]  SUBJECTIVE:  CHIEF COMPLAINT:   Chief Complaint  Patient presents with  . Hemoptysis   patient is a 69 year old Caucasian female with history of COPD but not on oxygen at home. History of ongoing smoking, hypertension, hyperlipidemia who presents to the hospital with hemoptysis. CT scan of the chest revealed a left upper lobe changes consistent with localized parenchymal hemorrhage which was uncertain, but no definite mass lesion was noted and direct visualization was recommended,  pulmonary consultation is pending. Patient denies significant hemoptysis recently. Discussed with patient about smoking for 4 minutes agreeable to continue nicotine replacement therapy  Review of Systems  Constitutional: Negative for fever, chills and weight loss.  HENT: Negative for congestion.   Eyes: Negative for blurred vision and double vision.  Respiratory: Positive for cough, hemoptysis and shortness of breath. Negative for sputum production and wheezing.   Cardiovascular: Negative for chest pain, palpitations, orthopnea, leg swelling and PND.  Gastrointestinal: Negative for nausea, vomiting, abdominal pain, diarrhea, constipation and blood in stool.  Genitourinary: Negative for dysuria, urgency, frequency and hematuria.  Musculoskeletal: Negative for falls.  Neurological: Negative for dizziness, tremors, focal weakness and headaches.  Endo/Heme/Allergies: Does not bruise/bleed easily.  Psychiatric/Behavioral: Negative for depression. The patient does not have insomnia.     VITAL SIGNS: Blood pressure 131/57, pulse 82, temperature 97.8 F (36.6 C), temperature source Oral, resp. rate 16, height '5\' 8"'$  (1.727 m), weight 61.689 kg (136 lb), SpO2 100 %.  PHYSICAL EXAMINATION:   GENERAL:  69 y.o.-year-old patient lying in the bed with no acute distress.  EYES:  Pupils equal, round, reactive to light and accommodation. No scleral icterus. Extraocular muscles intact.  HEENT: Head atraumatic, normocephalic. Oropharynx and nasopharynx clear.  NECK:  Supple, no jugular venous distention. No thyroid enlargement, no tenderness.  LUNGS: Diminished breath sounds bilaterally, no wheezing, few rales,rhonchi , but no crepitation. No use of accessory muscles of respiration.  CARDIOVASCULAR: S1, S2 normal. No murmurs, rubs, or gallops.  ABDOMEN: Soft, nontender, nondistended. Bowel sounds present. No organomegaly or mass.  EXTREMITIES: No pedal edema, cyanosis, or clubbing.  NEUROLOGIC: Cranial nerves II through XII are intact. Muscle strength 5/5 in all extremities. Sensation intact. Gait not checked.  PSYCHIATRIC: The patient is alert and oriented x 3.  SKIN: No obvious rash, lesion, or ulcer.   ORDERS/RESULTS REVIEWED:   CBC  Recent Labs Lab 12/11/14 1057 12/11/14 1301 12/12/14 0515  WBC 6.7 7.3 7.5  HGB  --  13.1 11.8*  HCT 37.2 38.7 33.4*  PLT  --  310 255  MCV  --  92.9 92.7  MCH 31.1 31.5 32.8  MCHC 33.9 33.8 35.4  RDW 13.0 12.8 12.9  LYMPHSABS 1.3 1.5  --   MONOABS  --  0.5  --   EOSABS  --  0.1  --   BASOSABS 0.0 0.0  --    ------------------------------------------------------------------------------------------------------------------  Chemistries   Recent Labs Lab 12/11/14 1057 12/11/14 1301 12/12/14 0515  NA 134* 134* 137  K 5.3* 4.4 4.0  CL 94* 101 106  CO2 '22 26 24  '$ GLUCOSE 85 93 91  BUN '13 13 12  '$ CREATININE 0.92 0.94 0.74  CALCIUM 9.8 9.7 8.8*  AST 15 16  --   ALT 10 15  --   ALKPHOS 83 75  --   BILITOT 0.4 0.4  --    ------------------------------------------------------------------------------------------------------------------  estimated creatinine clearance is 66.5 mL/min (by C-G formula based on Cr of  0.74). ------------------------------------------------------------------------------------------------------------------ No results for input(s): TSH, T4TOTAL, T3FREE, THYROIDAB in the last 72 hours.  Invalid input(s): FREET3  Cardiac Enzymes  Recent Labs Lab 12/11/14 1301  TROPONINI <0.03   ------------------------------------------------------------------------------------------------------------------ Invalid input(s): POCBNP ---------------------------------------------------------------------------------------------------------------  RADIOLOGY: Ct Angio Chest Pe W/cm &/or Wo Cm  12/11/2014  CLINICAL DATA:  Hemoptysis for more than 12 hours EXAM: CT ANGIOGRAPHY CHEST WITH CONTRAST TECHNIQUE: Multidetector CT imaging of the chest was performed using the standard protocol during bolus administration of intravenous contrast. Multiplanar CT image reconstructions and MIPs were obtained to evaluate the vascular anatomy. CONTRAST:  26m OMNIPAQUE IOHEXOL 350 MG/ML SOLN COMPARISON:  05/05/2014 09/02/2013 FINDINGS: The lungs are well aerated bilaterally and demonstrate significant emphysematous changes. The right lung demonstrates apical scarring although no infiltrate or sizable parenchymal nodule is noted. Scarring is also noted within the left apex as well as a confluent area of density which radiates from the left suprahilar region in a somewhat wedge-shaped form. Given the patient's clinical history of hemoptysis this would be consistent with localized hemorrhage no definitive mass is seen although would be difficult to exclude a small lesion given the surrounding hemorrhage. No definitive endobronchial lesion is seen. No other focal parenchymal abnormality is noted. The thoracic inlet is within normal limits. The thoracic aorta shows a normal branching pattern. No pulmonary emboli are seen. No definitive bronchial supply to this area is noted. No mediastinal adenopathy is seen. A few small  right hilar lymph nodes are noted. Mild coronary calcifications are seen. The visualized upper abdomen shows no acute abnormality. The bony structures are within normal limits. Review of the MIP images confirms the above findings. IMPRESSION: Changes in the left upper lobe posteriorly consistent with localized parenchymal hemorrhage. The etiology of this is uncertain as no definitive mass lesion is noted at this time. Direct visualization may be helpful. Emphysematous changes No evidence of pulmonary emboli or aortic dissection. Electronically Signed   By: MInez CatalinaM.D.   On: 12/11/2014 14:59    EKG:  Orders placed or performed during the hospital encounter of 12/11/14  . ED EKG  . ED EKG    ASSESSMENT AND PLAN:  Active Problems:   Hemoptysis 1. Hemoptysis with pulmonary hemorrhage on CT scan, seems to be subsiding, pulmonary consultation is requested for possible bronchoscopy outpatient or inpatient 2. COPD without exacerbation, not on oxygen therapy at home. Continue inhalation therapy with DuoNeb's and Symbicort, continue levofloxacin to cover possible infection 3. Tobacco abuse. Discussed this patient for 4 minutes. Nicotine replacement therapy to be continued. She is agreeable 4., Essential hypertension, continue outpatient medications, blood pressure control is satisfactory   Management plans discussed with the patient, family and they are in agreement.   DRUG ALLERGIES: No Known Allergies  CODE STATUS:     Code Status Orders        Start     Ordered   12/11/14 1712  Full code   Continuous     12/11/14 1712      TOTAL TIME TAKING CARE OF THIS PATIENT: 35 minutes.    VTheodoro GristM.D on 12/12/2014 at 12:53 PM  Between 7am to 6pm - Pager - [phone]  After 6pm go to [email or web address] - password [email or web address]  Office  [phone] CC: Primary care physician; MEnid Derry MD

## 2014-12-12 NOTE — Discharge Summary (Signed)
[facility] [place] NAME: Mary Hoover    MR#:  [id number]  DATE OF BIRTH:  [birth year]  DATE OF ADMISSION:  12/11/2014 ADMITTING PHYSICIAN: Gladstone Lighter, MD  DATE OF DISCHARGE: No discharge date for patient encounter.  PRIMARY CARE PHYSICIAN: Enid Derry, MD     ADMISSION DIAGNOSIS:  Hemoptysis [R04.2]  DISCHARGE DIAGNOSIS:  Principal Problem:   Hemoptysis Active Problems:   Essential hypertension   SECONDARY DIAGNOSIS:   Past Medical History  Diagnosis Date  . Hyperlipidemia   . Hypertension   . Carotid atherosclerosis   . Carotid bruit   . Hypothyroidism   . Tobacco use   . COPD (chronic obstructive pulmonary disease) (HCC)     emphysema  . Infected cat bite 1980s    was hospitalized    .pro HOSPITAL COURSE:  The patient is a 69 year old Caucasian female with history of COPD but not on oxygen at home. History of ongoing smoking, hypertension, hyperlipidemia who presents to the hospital with hemoptysis. CT scan of the chest revealed a left upper lobe changes consistent with localized parenchymal hemorrhage which was uncertain, but no definite mass lesion was noted and direct visualization was recommended, pulmonary consultation was obtained and outpatient bronchoscopy was recommended in about 1 week after Plavix is out from the system . Patient's hemoptysis has subsided stopping aspirin as well as Plavix Discussion by problem 1. Hemoptysis with pulmonary hemorrhage on CT scan,  subsided stopping aspirin and Plavix, pulmonary consultation was requested for possible bronchoscopy, recommended as outpatient after Plavix is out from the system in about 1 week . Hold aspirin and Plavix and resume it as outpatient 2. COPD without exacerbation, not on oxygen therapy at home. Continue inhalation therapy with DuoNeb's and Symbicort, continue levofloxacin to cover possible infection to complete course 3. Tobacco abuse.  Discussed this patient for 4 minutes. Nicotine replacement therapy to be continued. She is agreeable 4., Essential hypertension, continue outpatient medications, blood pressure control is satisfactory  DISCHARGE CONDITIONS:   Stable  CONSULTS OBTAINED:  Treatment Team:  Erby Pian, MD  DRUG ALLERGIES:  No Known Allergies  DISCHARGE MEDICATIONS:   Current Discharge Medication List    START taking these medications   Details  budesonide-formoterol (SYMBICORT) 160-4.5 MCG/ACT inhaler Inhale 2 puffs into the lungs 2 (two) times daily. Qty: 1 Inhaler, Refills: 12    ipratropium-albuterol (DUONEB) 0.5-2.5 (3) MG/3ML SOLN Take 3 mLs by nebulization every 4 (four) hours. Qty: 360 mL, Refills: 6    levofloxacin (LEVAQUIN) 500 MG tablet Take 1 tablet (500 mg total) by mouth daily. Qty: 6 tablet, Refills: 0    methylPREDNISolone (MEDROL DOSEPAK) 4 MG TBPK tablet follow package directions Qty: 21 tablet, Refills: 0    nicotine (NICODERM CQ - DOSED IN MG/24 HOURS) 21 mg/24hr patch Place 1 patch (21 mg total) onto the skin daily. Qty: 28 patch, Refills: 0      CONTINUE these medications which have NOT CHANGED   Details  atorvastatin (LIPITOR) 20 MG tablet Take 20 mg by mouth at bedtime.    diphenhydrAMINE (BENADRYL) 25 MG tablet Take 50 mg by mouth at bedtime as needed for sleep.    levothyroxine (SYNTHROID, LEVOTHROID) 75 MCG tablet Take 75 mcg by mouth daily.     lisinopril (PRINIVIL,ZESTRIL) 20 MG tablet Take 1 tablet (20 mg total) by mouth daily. Qty: 90 tablet, Refills: 1    vitamin B-12 (CYANOCOBALAMIN) 500 MCG tablet Take 500 mcg by mouth  daily.      STOP taking these medications     aspirin EC 81 MG tablet      clopidogrel (PLAVIX) 75 MG tablet          DISCHARGE INSTRUCTIONS:    Patient is to follow-up with primary care physician as well as pulmonologist, Dr. Jamal Collin as outpatient for outpatient bronchoscopy in about 1 week  If you experience worsening  of your admission symptoms, develop shortness of breath, life threatening emergency, suicidal or homicidal thoughts you must seek medical attention immediately by calling 911 or calling your MD immediately  if symptoms less severe.  You Must read complete instructions/literature along with all the possible adverse reactions/side effects for all the Medicines you take and that have been prescribed to you. Take any new Medicines after you have completely understood and accept all the possible adverse reactions/side effects.   Please note  You were cared for by a hospitalist during your hospital stay. If you have any questions about your discharge medications or the care you received while you were in the hospital after you are discharged, you can call the unit and asked to speak with the hospitalist on call if the hospitalist that took care of you is not available. Once you are discharged, your primary care physician will handle any further medical issues. Please note that NO REFILLS for any discharge medications will be authorized once you are discharged, as it is imperative that you return to your primary care physician (or establish a relationship with a primary care physician if you do not have one) for your aftercare needs so that they can reassess your need for medications and monitor your lab values.    Today   CHIEF COMPLAINT:   Chief Complaint  Patient presents with  . Hemoptysis    HISTORY OF PRESENT ILLNESS:  Mary Hoover  is a 68 y.o. female with a known history of COPD but not on oxygen at home. History of ongoing smoking, hypertension, hyperlipidemia who presents to the hospital with hemoptysis. CT scan of the chest revealed a left upper lobe changes consistent with localized parenchymal hemorrhage which was uncertain, but no definite mass lesion was noted and direct visualization was recommended, pulmonary consultation was obtained and outpatient bronchoscopy was recommended in  about 1 week after Plavix is out from the system . Patient's hemoptysis has subsided stopping aspirin as well as Plavix Discussion by problem 1. Hemoptysis with pulmonary hemorrhage on CT scan,  subsided stopping aspirin and Plavix, pulmonary consultation was requested for possible bronchoscopy, recommended as outpatient after Plavix is out from the system in about 1 week . Hold aspirin and Plavix and resume it as outpatient 2. COPD without exacerbation, not on oxygen therapy at home. Continue inhalation therapy with DuoNeb's and Symbicort, continue levofloxacin to cover possible infection to complete course 3. Tobacco abuse. Discussed this patient for 4 minutes. Nicotine replacement therapy to be continued. She is agreeable 4., Essential hypertension, continue outpatient medications, blood pressure control is satisfactory     VITAL SIGNS:  Blood pressure 131/57, pulse 82, temperature 97.8 F (36.6 C), temperature source Oral, resp. rate 16, height '5\' 8"'$  (1.727 m), weight 61.689 kg (136 lb), SpO2 100 %.  I/O:   Intake/Output Summary (Last 24 hours) at 12/12/14 1541 Last data filed at 12/12/14 1430  Gross per 24 hour  Intake   3771 ml  Output      0 ml  Net   3771 ml    PHYSICAL  EXAMINATION:  GENERAL:  69 y.o.-year-old patient lying in the bed with no acute distress.  EYES: Pupils equal, round, reactive to light and accommodation. No scleral icterus. Extraocular muscles intact.  HEENT: Head atraumatic, normocephalic. Oropharynx and nasopharynx clear.  NECK:  Supple, no jugular venous distention. No thyroid enlargement, no tenderness.  LUNGS: Some diminished breath sounds bilaterally posteriorly, no wheezing, rales,rhonchi or crepitation. No use of accessory muscles of respiration.  CARDIOVASCULAR: S1, S2 normal. No murmurs, rubs, or gallops.  ABDOMEN: Soft, non-tender, non-distended. Bowel sounds present. No organomegaly or mass.  EXTREMITIES: No pedal edema, cyanosis, or clubbing.   NEUROLOGIC: Cranial nerves II through XII are intact. Muscle strength 5/5 in all extremities. Sensation intact. Gait not checked.  PSYCHIATRIC: The patient is alert and oriented x 3.  SKIN: No obvious rash, lesion, or ulcer.   DATA REVIEW:   CBC  Recent Labs Lab 12/12/14 0515  WBC 7.5  HGB 11.8*  HCT 33.4*  PLT 255    Chemistries   Recent Labs Lab 12/11/14 1301 12/12/14 0515  NA 134* 137  K 4.4 4.0  CL 101 106  CO2 26 24  GLUCOSE 93 91  BUN 13 12  CREATININE 0.94 0.74  CALCIUM 9.7 8.8*  AST 16  --   ALT 15  --   ALKPHOS 75  --   BILITOT 0.4  --     Cardiac Enzymes  Recent Labs Lab 12/11/14 1301  TROPONINI <0.03    Microbiology Results  No results found for this or any previous visit.  RADIOLOGY:  Ct Angio Chest Pe W/cm &/or Wo Cm  12/11/2014  CLINICAL DATA:  Hemoptysis for more than 12 hours EXAM: CT ANGIOGRAPHY CHEST WITH CONTRAST TECHNIQUE: Multidetector CT imaging of the chest was performed using the standard protocol during bolus administration of intravenous contrast. Multiplanar CT image reconstructions and MIPs were obtained to evaluate the vascular anatomy. CONTRAST:  74m OMNIPAQUE IOHEXOL 350 MG/ML SOLN COMPARISON:  05/05/2014 09/02/2013 FINDINGS: The lungs are well aerated bilaterally and demonstrate significant emphysematous changes. The right lung demonstrates apical scarring although no infiltrate or sizable parenchymal nodule is noted. Scarring is also noted within the left apex as well as a confluent area of density which radiates from the left suprahilar region in a somewhat wedge-shaped form. Given the patient's clinical history of hemoptysis this would be consistent with localized hemorrhage no definitive mass is seen although would be difficult to exclude a small lesion given the surrounding hemorrhage. No definitive endobronchial lesion is seen. No other focal parenchymal abnormality is noted. The thoracic inlet is within normal limits. The  thoracic aorta shows a normal branching pattern. No pulmonary emboli are seen. No definitive bronchial supply to this area is noted. No mediastinal adenopathy is seen. A few small right hilar lymph nodes are noted. Mild coronary calcifications are seen. The visualized upper abdomen shows no acute abnormality. The bony structures are within normal limits. Review of the MIP images confirms the above findings. IMPRESSION: Changes in the left upper lobe posteriorly consistent with localized parenchymal hemorrhage. The etiology of this is uncertain as no definitive mass lesion is noted at this time. Direct visualization may be helpful. Emphysematous changes No evidence of pulmonary emboli or aortic dissection. Electronically Signed   By: MInez CatalinaM.D.   On: 12/11/2014 14:59    EKG:   Orders placed or performed during the hospital encounter of 12/11/14  . ED EKG  . ED EKG      Management plans  discussed with the patient, family and they are in agreement.  CODE STATUS:     Code Status Orders        Start     Ordered   12/11/14 1712  Full code   Continuous     12/11/14 1712      TOTAL TIME TAKING CARE OF THIS PATIENT: 40 minutes.    Theodoro Grist M.D on 12/12/2014 at 3:41 PM  Between 7am to 6pm - Pager - [phone]  After 6pm go to [email or web address] - password [email or web address]  Office  [phone]  CC: Primary care physician; Enid Derry, MD

## 2014-12-12 NOTE — Progress Notes (Signed)
Dr. Basilio Cairo notified of inability to draw gold assay until Monday, ordered to d/c test, asked if pt needed to be in TB isolation, MD order no need for isolation

## 2014-12-12 NOTE — Progress Notes (Signed)
MD order received to discharge pt home today; verbally reviewed AVS with pt including medications/Rxs electronically submitted to [facility] on [address] in [place], [place]; diet (heart healthy); activity as tolerated; follow up visit with Dr Sanda Klein on 12/16/14; no questions voiced at this time; pt discharged by nursing to the visitor's entrance

## 2014-12-14 ENCOUNTER — Telehealth: Payer: Self-pay | Admitting: *Deleted

## 2014-12-14 DIAGNOSIS — R042 Hemoptysis: Secondary | ICD-10-CM

## 2014-12-14 NOTE — Telephone Encounter (Signed)
LM for Bethena Roys in specials to give me a call about scheduling bronch for this pt.

## 2014-12-14 NOTE — Telephone Encounter (Signed)
LM on VM for pt to call back.  Need to inform pt that she is scheduled for a bronch on 12/14/14 @ 1pm. Pt needs to arrive at 12pm at [facility] and go to the registration desk (1st desk on [address]). Needs to be NPO after midnight.  Will await pt's return call.

## 2014-12-14 NOTE — Telephone Encounter (Signed)
-----   Message from Wilhelmina Mcardle, MD sent at 12/12/2014  1:53 PM EST ----- 1) Please arrange for quantiferon gold study first of next week 2) please arrange for outpt bronchoscopy Thurs 11/17 or Fri 11/18 by me. With fluoroscopy. Make sure it does not conflict with my clinic schedule

## 2014-12-14 NOTE — Telephone Encounter (Signed)
Will inform pt of bloodwork needed when I call in regards to bronch being scheduled.

## 2014-12-14 NOTE — Telephone Encounter (Signed)
Pt informed of her bronch that is scheduled for 12/17/14 @ 1pm. And that she is to be at [facility] at 12pm. Nothing further needed.

## 2014-12-15 ENCOUNTER — Telehealth: Payer: Self-pay | Admitting: Family Medicine

## 2014-12-15 LAB — QUANTIFERON IN TUBE
QFT TB AG MINUS NIL VALUE: 0.02 IU/mL
QUANTIFERON MITOGEN VALUE: 3.41 IU/mL
QUANTIFERON NIL VALUE: 0.03 [IU]/mL
QUANTIFERON TB AG VALUE: 0.05 IU/mL
QUANTIFERON TB GOLD: NEGATIVE

## 2014-12-15 LAB — QUANTIFERON TB GOLD ASSAY (BLOOD)

## 2014-12-15 NOTE — Telephone Encounter (Signed)
I talked with patient; she was coughing up blood, went to the ER; she sees me tomorrow; the lung doctor thinks this might be cancer; we'll fight this together; she'll come in tomorrow; she is coughing up very little blood now; was coughing up a lot to begin with; she goes to see lung doctor on Thursday (bronchoscopy), biopsy She thinks the lung doctor told her it was OKAY to keep taking aspirin, but she was supposed to stop her plavix; I reveiwed d/c summary and pulm consult note; both say hold aspirin and plavix I instructed her to call Dr. Alva Garnet office now after we hang up and let him know she's been taking the aspirin in case that changes his plan for bronch on Thursday

## 2014-12-16 ENCOUNTER — Ambulatory Visit (INDEPENDENT_AMBULATORY_CARE_PROVIDER_SITE_OTHER): Payer: Medicare Other

## 2014-12-16 ENCOUNTER — Telehealth: Payer: Self-pay

## 2014-12-16 VITALS — BP 126/69 | HR 98

## 2014-12-16 DIAGNOSIS — I10 Essential (primary) hypertension: Secondary | ICD-10-CM | POA: Diagnosis not present

## 2014-12-16 MED ORDER — LISINOPRIL 10 MG PO TABS: 15.0000 mg | ORAL_TABLET | Freq: Every day | ORAL | Status: DC

## 2014-12-16 NOTE — Telephone Encounter (Signed)
She needs new rx for Lisinopril '15mg'$  written.

## 2014-12-16 NOTE — Progress Notes (Signed)
Patient came in for BP check. We increased her Lisinopril from '10mg'$  to '20mg'$ . She is doing well, no complaints. Pulse was elevated at 98, but she states she has done a nebulizer treatment earlier and it makes her jittery and shakey. She got her labs that were due on Friday when she came in for an acute appointment.\  Per Dr. Jani Files, decrease to '15mg'$  and recheck in 2 weeks. No labs needed at that appt.

## 2014-12-16 NOTE — Patient Instructions (Signed)
Per Dr. Sanda Klein, decrease Lisinopril to '15mg'$  and recheck in 2 weeks.

## 2014-12-17 ENCOUNTER — Ambulatory Visit
Admission: RE | Admit: 2014-12-17 | Discharge: 2014-12-17 | Disposition: A | Discharge status: Home / Self Care | Payer: Medicare Other | Source: Ambulatory Visit | Attending: Pulmonary Disease | Admitting: Pulmonary Disease

## 2014-12-17 ENCOUNTER — Encounter
Admission: RE | Disposition: A | Discharge status: Home / Self Care | Payer: Self-pay | Source: Ambulatory Visit | Attending: Pulmonary Disease

## 2014-12-17 ENCOUNTER — Ambulatory Visit: Payer: Medicare Other

## 2014-12-17 ENCOUNTER — Encounter: Payer: Self-pay | Admitting: Certified Registered Nurse Anesthetist

## 2014-12-17 ENCOUNTER — Encounter: Payer: Self-pay | Admitting: *Deleted

## 2014-12-17 DIAGNOSIS — Z79899 Other long term (current) drug therapy: Secondary | ICD-10-CM | POA: Diagnosis not present

## 2014-12-17 DIAGNOSIS — Z801 Family history of malignant neoplasm of trachea, bronchus and lung: Secondary | ICD-10-CM | POA: Diagnosis not present

## 2014-12-17 DIAGNOSIS — E039 Hypothyroidism, unspecified: Secondary | ICD-10-CM | POA: Diagnosis not present

## 2014-12-17 DIAGNOSIS — I119 Hypertensive heart disease without heart failure: Secondary | ICD-10-CM | POA: Diagnosis not present

## 2014-12-17 DIAGNOSIS — Z9889 Other specified postprocedural states: Secondary | ICD-10-CM | POA: Diagnosis not present

## 2014-12-17 DIAGNOSIS — E785 Hyperlipidemia, unspecified: Secondary | ICD-10-CM | POA: Diagnosis not present

## 2014-12-17 DIAGNOSIS — R918 Other nonspecific abnormal finding of lung field: Secondary | ICD-10-CM | POA: Insufficient documentation

## 2014-12-17 DIAGNOSIS — Z823 Family history of stroke: Secondary | ICD-10-CM | POA: Insufficient documentation

## 2014-12-17 DIAGNOSIS — Z8249 Family history of ischemic heart disease and other diseases of the circulatory system: Secondary | ICD-10-CM | POA: Diagnosis not present

## 2014-12-17 DIAGNOSIS — R042 Hemoptysis: Secondary | ICD-10-CM | POA: Insufficient documentation

## 2014-12-17 DIAGNOSIS — Z833 Family history of diabetes mellitus: Secondary | ICD-10-CM | POA: Insufficient documentation

## 2014-12-17 DIAGNOSIS — J449 Chronic obstructive pulmonary disease, unspecified: Secondary | ICD-10-CM | POA: Insufficient documentation

## 2014-12-17 DIAGNOSIS — C3490 Malignant neoplasm of unspecified part of unspecified bronchus or lung: Secondary | ICD-10-CM | POA: Insufficient documentation

## 2014-12-17 DIAGNOSIS — F17210 Nicotine dependence, cigarettes, uncomplicated: Secondary | ICD-10-CM | POA: Diagnosis not present

## 2014-12-17 HISTORY — PX: FLEXIBLE BRONCHOSCOPY: SHX5094

## 2014-12-17 SURGERY — BRONCHOSCOPY, FLEXIBLE
Anesthesia: Moderate Sedation

## 2014-12-17 MED ORDER — BUTAMBEN-TETRACAINE-BENZOCAINE 2-2-14 % EX AERO
1.0000 | INHALATION_SPRAY | Freq: Once | CUTANEOUS | Status: DC
  Filled 2014-12-17: qty 20

## 2014-12-17 MED ORDER — LIDOCAINE HCL 2 % EX GEL: 1.0000 "application " | Freq: Once | CUTANEOUS | Status: DC

## 2014-12-17 MED ORDER — MIDAZOLAM HCL 2 MG/2ML IJ SOLN
INTRAMUSCULAR | Status: AC | PRN
  Administered 2014-12-17: 2 mg via INTRAVENOUS

## 2014-12-17 MED ORDER — FENTANYL CITRATE (PF) 100 MCG/2ML IJ SOLN
INTRAMUSCULAR | Status: AC | PRN
  Administered 2014-12-17: 25 ug via INTRAVENOUS
  Administered 2014-12-17: 50 ug via INTRAVENOUS

## 2014-12-17 MED ORDER — MIDAZOLAM HCL 5 MG/5ML IJ SOLN
INTRAMUSCULAR | Status: AC | PRN
  Administered 2014-12-17: 2 mg via INTRAVENOUS

## 2014-12-17 MED ORDER — FENTANYL CITRATE (PF) 100 MCG/2ML IJ SOLN
INTRAMUSCULAR | Status: AC
  Filled 2014-12-17: qty 4

## 2014-12-17 MED ORDER — SODIUM CHLORIDE 0.9 % IV SOLN
Freq: Once | INTRAVENOUS | Status: AC
  Administered 2014-12-17: 13:00:00 via INTRAVENOUS

## 2014-12-17 MED ORDER — PHENYLEPHRINE HCL 0.25 % NA SOLN
1.0000 | Freq: Four times a day (QID) | NASAL | Status: DC | PRN
  Filled 2014-12-17: qty 15

## 2014-12-17 MED ORDER — MIDAZOLAM HCL 5 MG/5ML IJ SOLN
INTRAMUSCULAR | Status: AC
  Filled 2014-12-17: qty 10

## 2014-12-17 NOTE — Procedures (Signed)
Indication:   Hemoptysis, LUL nodules  Sedation:  midaz 4 mg Fentanyl 75 mcg  Anesthesia: Cetacaine to throat Viscous lidocaine to R naris 40 cc 1% lidocaine used during procedure  Procedure: After adequate sedation and anesthesia, the bronchoscope was introduced via the R naris and advanced into the posterior pharynx. Further anesthesia was obtained with 1% lidocaine and the scope was advanced into the trachea. Complete airway anesthesia was achieved with 1% lidocaine and a thorough airway examination was performed. This revealed the following.  Findings:  Anatomic variant in LUL Chronic bronchitic changes throughout Scant bleeding eminating from LUL  Specimens:   Washings, brushings, TBBx LUL sent for AFB, cytology, surg path  Post procedure evaluation:  The patient tolerated the procedure well with no major complications. There was very mild epistaxis from R naris    Merton Border, MD;  PCCM service; Mobile [phone]

## 2014-12-17 NOTE — H&P (Signed)
HPI:  42 F smoker of 1.5 to 2 PPD admitted 11/11 with hemoptysis. She developed mild hemoptysis on the morning of admission and was seen by her PCP. A CXR was ordered. At the time of the CXR, the hemoptysis had worsened and she was directed to the ED for admission. A CT chest was performed with the findings as below.   She has never had hemoptysis previously. She denies fever, purulent sputum, LE edema, calf tenderness. She has no constitutional symptoms such as weight loss. Presently, she has no dyspnea or CP. She continues to have mild hemoptysis which is demonstrated only with forced cough. She has no history of TB exposure or positive skin test  Past Medical History  Diagnosis Date  . Hyperlipidemia   . Hypertension   . Carotid atherosclerosis   . Carotid bruit   . Hypothyroidism   . Tobacco use   . COPD (chronic obstructive pulmonary disease) (HCC)     emphysema  . Infected cat bite 1980s    was hospitalized   Past Surgical History  Procedure Laterality Date  . Incision and drainage / excision thyroglossal cyst  April 2011  . Carotid endarterectomy Right Oct. 2015    Dr. Lucky Cowboy  . Carotid stenosis Left April 2016    carotid stenosis surgery    MEDICATIONS: reviewed. Includes asa and clopidogrel   Social History   Social History  . Marital Status: Married    Spouse Name: N/A  . Number of Children: N/A  . Years of Education: N/A   Occupational History  . Not on file.   Social History Main Topics  . Smoking status: Current Every Day Smoker -- 1.50 packs/day for 50 years    Types: Cigarettes  . Smokeless tobacco: Never Used  . Alcohol Use: 0.0 oz/week    0 Standard drinks or equivalent per week     Comment: occasional  . Drug Use: No  . Sexual Activity: Not on file   Other Topics Concern  . Not on file   Social History Narrative   Lives at home  with husband    Family History  Problem Relation Age of Onset  . Congestive Heart Failure Mother   . Heart disease Mother   . Hypertension Mother   . Hypertension Sister   . Diabetes Sister   . Heart disease Sister   . Cancer Brother     liver, lung  . Heart disease Maternal Grandmother   . Heart disease Maternal Uncle   . Stroke Maternal Grandfather   . COPD Neg Hx   . Heart disease Brother   . Hypertension Brother     ROS - Negative except as above  Filed Vitals:   12/11/14 1630 12/11/14 1704 12/11/14 2112 12/12/14 0511  BP: 162/64 146/70 159/62 131/57  Pulse: 74 66 74 82  Temp:  97.7 F (36.5 C) 97.5 F (36.4 C) 97.8 F (36.6 C)  TempSrc:  Oral Oral Oral  Resp: '19 20 18 16  '$ Height:      Weight:      SpO2: 97% 100% 99% 100%    EXAM:  Gen: NAD, appears older than stated age HEENT: NCAT, EOMI, PERRL Neck: No LAN Lungs: slightly coarse throughout. No wheezes Cardiovascular: Reg, no M Abdomen: Soft, NT, +BS Ext: no C/C/E Neuro: no focal deficits  DATA:  BMP Latest Ref Rng 12/12/2014 12/11/2014 12/11/2014  Glucose 65 - 99 mg/dL 91 93 85  BUN 6 - 20 mg/dL 12 13 13  Creatinine 0.44 - 1.00 mg/dL 0.74 0.94 0.92  BUN/Creat Ratio 11 - 26 - - 14  Sodium 135 - 145 mmol/L 137 134(L) 134(L)  Potassium 3.5 - 5.1 mmol/L 4.0 4.4 5.3(H)  Chloride 101 - 111 mmol/L 106 101 94(L)  CO2 22 - 32 mmol/L '24 26 22  '$ Calcium 8.9 - 10.3 mg/dL 8.8(L) 9.7 9.8    CBC Latest Ref Rng 12/12/2014 12/11/2014 12/11/2014  WBC 3.6 - 11.0 K/uL 7.5 7.3 6.7  Hemoglobin 12.0 - 16.0 g/dL 11.8(L) 13.1 -  Hematocrit 35.0 - 47.0 % 33.4(L) 38.7 37.2  Platelets 150 - 440 K/uL 255 310 -   Coags normal  Labs Past 24 Hours       CT chest: moderate emphysema, LUL opacity with nodularity  IMPRESSION:  COPD,  emphysema Smoker Hemoptysis - likely due to LUL process. Also possibly acute bronchitis LUL opacity - given the nodular appearance, I am concerned about malignancy. TB would be possible but less likely  PLAN:  Bronchoscopy wit TBBx today   Merton Border, MD PCCM service Mobile [phone] Pager [phone]

## 2014-12-18 ENCOUNTER — Encounter: Payer: Self-pay | Admitting: Pulmonary Disease

## 2014-12-18 ENCOUNTER — Other Ambulatory Visit: Payer: Self-pay | Admitting: Family Medicine

## 2014-12-18 LAB — CYTOLOGY - NON PAP

## 2014-12-18 LAB — SURGICAL PATHOLOGY

## 2014-12-22 ENCOUNTER — Telehealth: Payer: Self-pay | Admitting: Pulmonary Disease

## 2014-12-22 NOTE — Telephone Encounter (Signed)
Please inform her that the biopsy results revealed no definite cancer. However, I am still concerned about that possibility. I believe that she has an office appt with Dr Ashby Dawes (correct me if that is wrong). At that time, he can review options with her which should include presenting her case at multidisciplinary oncology conference.  Merton Border, MD PCCM service Mobile [phone] Pager [phone]

## 2014-12-22 NOTE — Telephone Encounter (Signed)
Called pt and informed her of your response. She was not scheduled to f/u with any one so I did schedule her to come in Monday to f/u with you. Nothing further needed.

## 2014-12-22 NOTE — Telephone Encounter (Signed)
Wants results from lung biopsy

## 2014-12-22 NOTE — Telephone Encounter (Signed)
Informed pt I would forward the message to DS.  Dr.SImonds please advise on BX results. Thanks

## 2014-12-22 NOTE — Telephone Encounter (Signed)
When can patient restart Plavix and ASA.  Please call.

## 2014-12-22 NOTE — Telephone Encounter (Signed)
She is not scheduled for an appt at this time. When would you like to see her back?

## 2014-12-23 NOTE — Telephone Encounter (Signed)
ATC pt. Line busy. WCB 

## 2014-12-28 ENCOUNTER — Encounter: Payer: Self-pay | Admitting: Pulmonary Disease

## 2014-12-28 ENCOUNTER — Ambulatory Visit (INDEPENDENT_AMBULATORY_CARE_PROVIDER_SITE_OTHER): Payer: Medicare Other | Admitting: Pulmonary Disease

## 2014-12-28 VITALS — BP 150/80 | HR 85 | Ht 68.0 in | Wt 137.0 lb

## 2014-12-28 DIAGNOSIS — R042 Hemoptysis: Secondary | ICD-10-CM | POA: Diagnosis not present

## 2014-12-28 DIAGNOSIS — Z720 Tobacco use: Secondary | ICD-10-CM

## 2014-12-28 DIAGNOSIS — R918 Other nonspecific abnormal finding of lung field: Secondary | ICD-10-CM | POA: Diagnosis not present

## 2014-12-28 DIAGNOSIS — F17200 Nicotine dependence, unspecified, uncomplicated: Secondary | ICD-10-CM

## 2014-12-28 NOTE — Telephone Encounter (Signed)
Pt was seen this am and discussed with DS. Nothing further needed.

## 2014-12-28 NOTE — Patient Instructions (Signed)
Resume Plavix as previously Stop smoking!!! Follow up in 4-6 weeks after repeat CT scan of your chest

## 2014-12-29 NOTE — Progress Notes (Signed)
PROBLEMS: COPD (presumed) Smoker Hospitalized 11/11 - 11/12 for hemoptysis CT chest revealed LUL nodular infiltrate Pulmonary consultation 11/12. CT chest findings noted. Pt was on ASA, clopidogrel which were held. Pt discharged on course of abx and pred with plan for outpt FOB FOB 1/17: no active bleeding. Washings, brushings, TBBx in LUL were negative for cytology. Pt instructed to resume ASA and remain off clopidogrel until seen in office  SUBJ: No new complaints. No further hemoptysis. Has cut down on smoking to approx 1/2 ppd  OBJ: Filed Vitals:   12/28/14 1159  BP: 150/80  Pulse: 85  Height: '5\' 8"'$  (1.727 m)  Weight: 137 lb (62.143 kg)  SpO2: 100%    Gen: WDWN in NAD HEENT: All WNL Neck: NO LAN, no JVD noted Lungs: full BS, normal percussion note throughout, no adventitious sounds Cardiovascular: Reg rate, normal rhythm, no M noted Abdomen: Soft, NT +BS Ext: no C/C/E Neuro: CNs intact, motor/sens grossly intact Skin: No lesions noted   DATA: 11/17 Path results negative  IMPRESSION: Hemoptysis, resolved Smoker Likely COPD LUL complex opacity - concern for malignancy persists but it might also represent infectious infiltrate or alveolar blood  PLAN: Counseled again re: need for smoking cessation Resume clopidogrel ROV 4-6 wks after repeat CT chest to re-eval LUL lesion  Wilhelmina Mcardle, MD [facility]/CCM

## 2015-01-21 ENCOUNTER — Other Ambulatory Visit: Payer: Self-pay | Admitting: Family Medicine

## 2015-01-21 MED ORDER — LISINOPRIL 10 MG PO TABS: 15.0000 mg | ORAL_TABLET | Freq: Every day | ORAL | Status: DC

## 2015-01-21 NOTE — Telephone Encounter (Signed)
Pts rx for lisinopril was changed at appt but not at [facility] so she would like to have someone call it in the correct way.

## 2015-01-21 NOTE — Telephone Encounter (Signed)
done

## 2015-01-21 NOTE — Telephone Encounter (Signed)
It looks like she just needs a new rx sent to [facility] for a 90 day supply.

## 2015-01-30 LAB — ACID FAST SMEAR+CULTURE W/RFLX (ARMC ONLY)
Acid Fast Culture: NEGATIVE
Acid Fast Smear: NEGATIVE

## 2015-02-02 ENCOUNTER — Ambulatory Visit
Admission: RE | Admit: 2015-02-02 | Discharge: 2015-02-02 | Disposition: A | Discharge status: Home / Self Care | Payer: Medicare Other | Source: Ambulatory Visit | Attending: Pulmonary Disease | Admitting: Pulmonary Disease

## 2015-02-02 DIAGNOSIS — J439 Emphysema, unspecified: Secondary | ICD-10-CM | POA: Diagnosis not present

## 2015-02-02 DIAGNOSIS — J432 Centrilobular emphysema: Secondary | ICD-10-CM | POA: Diagnosis not present

## 2015-02-02 DIAGNOSIS — I2510 Atherosclerotic heart disease of native coronary artery without angina pectoris: Secondary | ICD-10-CM | POA: Insufficient documentation

## 2015-02-02 DIAGNOSIS — R918 Other nonspecific abnormal finding of lung field: Secondary | ICD-10-CM | POA: Diagnosis not present

## 2015-02-05 ENCOUNTER — Other Ambulatory Visit: Payer: Self-pay | Admitting: Family Medicine

## 2015-02-05 NOTE — Telephone Encounter (Signed)
Labs from Nov 2016 reviewed; rx approved

## 2015-02-08 ENCOUNTER — Ambulatory Visit: Payer: Medicare Other | Admitting: Pulmonary Disease

## 2015-02-25 ENCOUNTER — Ambulatory Visit (INDEPENDENT_AMBULATORY_CARE_PROVIDER_SITE_OTHER): Payer: Medicare Other | Admitting: Pulmonary Disease

## 2015-02-25 ENCOUNTER — Encounter: Payer: Self-pay | Admitting: Pulmonary Disease

## 2015-02-25 VITALS — BP 128/72 | HR 92 | Ht 67.0 in | Wt 135.8 lb

## 2015-02-25 DIAGNOSIS — R918 Other nonspecific abnormal finding of lung field: Secondary | ICD-10-CM

## 2015-02-25 DIAGNOSIS — J438 Other emphysema: Secondary | ICD-10-CM

## 2015-02-25 DIAGNOSIS — Z720 Tobacco use: Secondary | ICD-10-CM

## 2015-02-25 DIAGNOSIS — F17200 Nicotine dependence, unspecified, uncomplicated: Secondary | ICD-10-CM

## 2015-02-25 DIAGNOSIS — R042 Hemoptysis: Secondary | ICD-10-CM | POA: Diagnosis not present

## 2015-02-25 NOTE — Progress Notes (Signed)
PROBLEMS: COPD (presumed) Smoker 12/11/14 - 12/12/14 Hospitalized for hemoptysis. CT chest revealed LUL nodular infiltrate 12/12/14 Pulmonary consultation. CT chest findings noted. Pt was on ASA, clopidogrel which were held. Pt discharged on course of abx and pred with plan for outpt FOB 12/17/14 FOB: no active bleeding. Washings, brushings, TBBx in LUL were negative for cytology. Pt instructed to resume ASA and remain off clopidogrel until seen in office 12/28/14 Office follow up: counseled re: smoking cessation. Repeat CT chest ordered 02/02/15 CT chest: complete resolution of confluent airspace opacities/ground-glass opacities of the left upper lobe. Extensive centrilobular and paraseptal emphysema, most pronounced at the lung apices. Evidence of prior granulomatous disease with calcified granulomas of the right lung and the liver. 02/25/15 Office follow up: Ct findings reviewed with patient. Smoking cessation counseling again provided  SUBJ: No new complaints. No further hemoptysis. Continues to smoke 3/4 ppd  OBJ: Filed Vitals:   02/25/15 1107  BP: 128/72  Pulse: 92  Height: '5\' 7"'$  (1.702 m)  Weight: 135 lb 12.8 oz (61.598 kg)  SpO2: 98%    Gen: WDWN in NAD HEENT: All WNL Neck: NO LAN, no JVD noted Lungs: full BS, normal percussion note throughout, no adventitious sounds Cardiovascular: Reg rate, normal rhythm, no M noted Abdomen: Soft, NT +BS Ext: no C/C/E Neuro: CNs intact, motor/sens grossly intact Skin: No lesions noted   DATA: CT chest 02/02/15:  complete resolution of confluent airspace opacities/ground-glass opacities of the left upper lobe, with no acute finding on the current CT.  Extensive centrilobular and paraseptal emphysema, most pronounced at the lung apices.  Atherosclerosis with left main and 2 vessel coronary artery disease.  Evidence of prior granulomatous disease with calcified granulomas of the right lung and the  liver.  IMPRESSION: Hemoptysis, resolved Smoker Severe emphysema by CT chest LUL opacity, resolved.  PLAN: Counseled again re: need for smoking cessation ROV 6 wks with PFTs and to again discuss smoking cessation. Consider Chantix at that time  Wilhelmina Mcardle, MD [place] [facility]/CCM

## 2015-03-11 ENCOUNTER — Other Ambulatory Visit: Payer: Self-pay | Admitting: Family Medicine

## 2015-03-11 NOTE — Telephone Encounter (Signed)
Nov 2016 BMP reviewed; Rx approved

## 2015-04-02 ENCOUNTER — Ambulatory Visit (INDEPENDENT_AMBULATORY_CARE_PROVIDER_SITE_OTHER): Payer: Medicare Other | Admitting: *Deleted

## 2015-04-02 DIAGNOSIS — J438 Other emphysema: Secondary | ICD-10-CM | POA: Diagnosis not present

## 2015-04-02 DIAGNOSIS — F17200 Nicotine dependence, unspecified, uncomplicated: Secondary | ICD-10-CM

## 2015-04-02 LAB — PULMONARY FUNCTION TEST
DL/VA % PRED: 56 %
DL/VA: 2.96 ml/min/mmHg/L
DLCO UNC: 13.61 ml/min/mmHg
DLCO unc % pred: 45 %
FEF 25-75 POST: 1.39 L/s
FEF 25-75 Pre: 1.37 L/sec
FEF2575-%Change-Post: 1 %
FEF2575-%Pred-Post: 62 %
FEF2575-%Pred-Pre: 61 %
FEV1-%CHANGE-POST: -2 %
FEV1-%PRED-POST: 65 %
FEV1-%PRED-PRE: 66 %
FEV1-Post: 1.77 L
FEV1-Pre: 1.81 L
FEV1FVC-%CHANGE-POST: -4 %
FEV1FVC-%Pred-Pre: 94 %
FEV6-%Change-Post: 3 %
FEV6-%PRED-PRE: 72 %
FEV6-%Pred-Post: 75 %
FEV6-PRE: 2.49 L
FEV6-Post: 2.57 L
FEV6FVC-%PRED-PRE: 104 %
FEV6FVC-%Pred-Post: 104 %
FVC-%Change-Post: 2 %
FVC-%PRED-PRE: 70 %
FVC-%Pred-Post: 72 %
FVC-POST: 2.57 L
FVC-PRE: 2.51 L
POST FEV6/FVC RATIO: 100 %
PRE FEV1/FVC RATIO: 72 %
Post FEV1/FVC ratio: 69 %
Pre FEV6/FVC Ratio: 100 %

## 2015-04-02 NOTE — Progress Notes (Signed)
PFT performed today with Nitrogen washout. 

## 2015-04-05 ENCOUNTER — Encounter: Payer: Self-pay | Admitting: Pulmonary Disease

## 2015-04-05 ENCOUNTER — Ambulatory Visit (INDEPENDENT_AMBULATORY_CARE_PROVIDER_SITE_OTHER): Payer: Medicare Other | Admitting: Pulmonary Disease

## 2015-04-05 VITALS — BP 130/78 | HR 78 | Ht 67.0 in | Wt 136.6 lb

## 2015-04-05 DIAGNOSIS — F17200 Nicotine dependence, unspecified, uncomplicated: Secondary | ICD-10-CM

## 2015-04-05 DIAGNOSIS — Z720 Tobacco use: Secondary | ICD-10-CM

## 2015-04-05 DIAGNOSIS — J449 Chronic obstructive pulmonary disease, unspecified: Secondary | ICD-10-CM | POA: Diagnosis not present

## 2015-04-05 MED ORDER — VARENICLINE TARTRATE 1 MG PO TABS: 1.0000 mg | ORAL_TABLET | Freq: Two times a day (BID) | ORAL | Status: DC

## 2015-04-05 MED ORDER — VARENICLINE TARTRATE 0.5 MG X 11 & 1 MG X 42 PO MISC: ORAL | Status: DC

## 2015-04-09 NOTE — Progress Notes (Signed)
PROBLEMS: COPD (presumed) Smoker 12/11/14 - 12/12/14 Hospitalized for hemoptysis. CT chest revealed LUL nodular infiltrate 12/12/14 Pulmonary consultation. CT chest findings noted. Pt was on ASA, clopidogrel which were held. Pt discharged on course of abx and pred with plan for outpt FOB 12/17/14 FOB: no active bleeding. Washings, brushings, TBBx in LUL were negative for cytology. Pt instructed to resume ASA and remain off clopidogrel until seen in office 12/28/14 Office follow up: counseled re: smoking cessation. Repeat CT chest ordered 02/02/15 CT chest: complete resolution of confluent airspace opacities/ground-glass opacities of the left upper lobe. Extensive centrilobular and paraseptal emphysema, most pronounced at the lung apices. Evidence of prior granulomatous disease with calcified granulomas of the right lung and the liver. 02/25/15 Office follow up: Ct findings reviewed with patient. Smoking cessation counseling again provided 04/02/15: PFTs: mild obstruction, moderately to severely reduced DLCO 04/05/15 ROV: No new complaints. Unable to quit smoking without medical assistance. Chantix initiated  SUBJ: No new complaints. Has made efforts @ smoking cessation but unsuccessful thus far. Continues to smoke 1/2 ppd. She is not using Symbicort consistently. Cannot notice a difference regardless of whether she uses it or not. Denies CP, fever, purulent sputum, hemoptysis, LE edema and calf tenderness   OBJ: Filed Vitals:   04/05/15 1049  BP: 130/78  Pulse: 78  Height: '5\' 7"'$  (1.702 m)  Weight: 136 lb 9.6 oz (61.961 kg)  SpO2: 98%    Gen: WDWN in NAD HEENT: All WNL Neck: NO LAN, no JVD noted Lungs: full BS, normal percussion note throughout, no adventitious sounds Cardiovascular: Reg rate, normal rhythm, no M noted Abdomen: Soft, NT +BS Ext: no C/C/E Neuro: CNs intact, motor/sens grossly intact Skin: No lesions noted   DATA: No new labs or Xrays PFTs 04/02/15: mild  obstruction, moderately to severely reduced DLCO  IMPRESSION: Severe emphysema by CT chest Recalcitrant smoker  PLAN: Recommended that she may continue Symbicort as prescribed if she thinks it is beneficial and may stop it if she is unable to discern a benefit Counseled again re: need for smoking cessation Begin varenicline  ROV 6 wks to again discuss smoking cessation  Wilhelmina Mcardle, MD [place] [facility]/CCM

## 2015-05-13 ENCOUNTER — Other Ambulatory Visit: Payer: Self-pay | Admitting: Family Medicine

## 2015-05-14 NOTE — Telephone Encounter (Signed)
K+ and Cr from Nov 2016 reviewed; Rx approved

## 2015-05-21 ENCOUNTER — Ambulatory Visit: Payer: Medicare Other | Admitting: Pulmonary Disease

## 2015-06-25 ENCOUNTER — Ambulatory Visit: Payer: Medicare Other | Admitting: Family Medicine

## 2015-07-13 ENCOUNTER — Encounter: Payer: Self-pay | Admitting: Family Medicine

## 2015-07-13 ENCOUNTER — Ambulatory Visit (INDEPENDENT_AMBULATORY_CARE_PROVIDER_SITE_OTHER): Payer: Medicare Other | Admitting: Family Medicine

## 2015-07-13 VITALS — BP 137/69 | HR 78 | Temp 98.7°F | Ht 67.0 in | Wt 147.0 lb

## 2015-07-13 DIAGNOSIS — Z23 Encounter for immunization: Secondary | ICD-10-CM

## 2015-07-13 DIAGNOSIS — E785 Hyperlipidemia, unspecified: Secondary | ICD-10-CM | POA: Diagnosis not present

## 2015-07-13 DIAGNOSIS — I10 Essential (primary) hypertension: Secondary | ICD-10-CM

## 2015-07-13 DIAGNOSIS — E038 Other specified hypothyroidism: Secondary | ICD-10-CM | POA: Diagnosis not present

## 2015-07-13 DIAGNOSIS — E034 Atrophy of thyroid (acquired): Secondary | ICD-10-CM | POA: Diagnosis not present

## 2015-07-13 DIAGNOSIS — K1370 Unspecified lesions of oral mucosa: Secondary | ICD-10-CM | POA: Diagnosis not present

## 2015-07-13 MED ORDER — LISINOPRIL 10 MG PO TABS: ORAL_TABLET | ORAL | Status: DC

## 2015-07-13 MED ORDER — ATORVASTATIN CALCIUM 20 MG PO TABS: ORAL_TABLET | ORAL | Status: DC

## 2015-07-13 NOTE — Progress Notes (Signed)
BP 137/69 mmHg  Pulse 78  Temp(Src) 98.7 F (37.1 C)  Ht '5\' 7"'$  (1.702 m)  Wt 147 lb (66.679 kg)  BMI 23.02 kg/m2  SpO2 100%   Subjective:    Patient ID: Mary Hoover, female    DOB: 1945/03/08, 70 y.o.   MRN: [id number]  HPI: Mary Hoover is a 70 y.o. female  Chief Complaint  Patient presents with  . Hyperlipidemia  . Hypothyroidism  . Hypertension   HYPERTENSION / [place] Satisfied with current treatment? yes Duration of hypertension: chronic BP monitoring frequency: not checking BP medication side effects: no Duration of hyperlipidemia: chronic Cholesterol medication side effects: no Cholesterol supplements: none Past cholesterol medications: atorvastatin Medication compliance: excellent compliance Aspirin: yes Recent stressors: yes Recurrent headaches: no Visual changes: no Palpitations: no Dyspnea: no Chest pain: no Lower extremity edema: no Dizzy/lightheaded: no\  HYPOTHYROIDISM Thyroid control status:controlled Satisfied with current treatment? yes Medication side effects: yes Medication compliance: excellent compliance Recent dose adjustment:no Fatigue: no Cold intolerance: no Heat intolerance: no Weight gain: yes Weight loss: no Constipation: no Diarrhea/loose stools: no Palpitations: no Lower extremity edema: no Anxiety/depressed mood: no  SKIN LESION Duration: 2 months Location: inside of lip Painful: yes with tooth hitting it Itching: no Onset: sudden Context: not changing Associated signs and symptoms: nothing History of skin cancer: no History of precancerous skin lesions: no Family history of skin cancer: yes- Mom   Relevant past medical, surgical, family and social history reviewed and updated as indicated. Interim medical history since our last visit reviewed. Allergies and medications reviewed and updated.  Review of Systems  Constitutional: Negative.   Respiratory: Negative.   Cardiovascular: Negative.    Endocrine: Negative.   Skin: Negative.  Negative for color change, pallor, rash and wound.  Psychiatric/Behavioral: Negative.     Per HPI unless specifically indicated above     Objective:    BP 137/69 mmHg  Pulse 78  Temp(Src) 98.7 F (37.1 C)  Ht '5\' 7"'$  (1.702 m)  Wt 147 lb (66.679 kg)  BMI 23.02 kg/m2  SpO2 100%  Wt Readings from Last 3 Encounters:  07/13/15 147 lb (66.679 kg)  04/05/15 136 lb 9.6 oz (61.961 kg)  02/25/15 135 lb 12.8 oz (61.598 kg)    Physical Exam  Constitutional: She is oriented to person, place, and time. She appears well-developed and well-nourished. No distress.  HENT:  Head: Normocephalic and atraumatic.  Right Ear: Hearing normal.  Left Ear: Hearing normal.  Nose: Nose normal.  Eyes: Conjunctivae and lids are normal. Right eye exhibits no discharge. Left eye exhibits no discharge. No scleral icterus.  Cardiovascular: Normal rate, regular rhythm, normal heart sounds and intact distal pulses.  Exam reveals no gallop and no friction rub.   No murmur heard. Pulmonary/Chest: Effort normal and breath sounds normal. No respiratory distress. She has no wheezes. She has no rales. She exhibits no tenderness.  Musculoskeletal: Normal range of motion.  Neurological: She is alert and oriented to person, place, and time.  Skin: Skin is warm, dry and intact. No rash noted. She is not diaphoretic. No erythema. No pallor.  Black raised lesion on inside of R lip  Psychiatric: She has a normal mood and affect. Her speech is normal and behavior is normal. Judgment and thought content normal. Cognition and memory are normal.  Nursing note and vitals reviewed.      Assessment & Plan:   Problem List Items Addressed This Visit      Cardiovascular and  Mediastinum   Essential hypertension - Primary    Under good control. Continue current regimen. Continue to monitor. Rechecking CMP. Call with any concerns.       Relevant Medications   lisinopril  (PRINIVIL,ZESTRIL) 10 MG tablet   atorvastatin (LIPITOR) 20 MG tablet   Other Relevant Orders   CBC with Differential/Platelet   Comprehensive metabolic panel   Microalbumin, Urine Waived   UA/M w/rflx Culture, Routine     Endocrine   Hypothyroidism    Rechecking levels today. Await results and treat as needed.       Relevant Orders   Comprehensive metabolic panel   TSH     Other   Hyperlipidemia    Under good control. Continue current regimen. Continue to monitor.       Relevant Medications   lisinopril (PRINIVIL,ZESTRIL) 10 MG tablet   atorvastatin (LIPITOR) 20 MG tablet   Other Relevant Orders   Comprehensive metabolic panel   Lipid Panel [place], Waived    Other Visit Diagnoses    Need for Tdap vaccination        Tdap updated today.    Relevant Orders    Tdap vaccine greater than or equal to 7yo IM (Completed)    Immunization due        Prevnar given today.    Relevant Orders    Pneumococcal conjugate vaccine 13-valent IM (Completed)    Oral mucosal lesion        Long history of smoking. Will refer to ENT for evaluation. Referral generated today.    Relevant Orders    Ambulatory referral to ENT        Follow up plan: Return in about 6 months (around 01/12/2016) for Wellness.

## 2015-07-13 NOTE — Assessment & Plan Note (Signed)
Rechecking levels today. Await results and treat as needed.

## 2015-07-13 NOTE — Assessment & Plan Note (Signed)
Under good control. Continue current regimen. Continue to monitor.  

## 2015-07-13 NOTE — Assessment & Plan Note (Signed)
Under good control. Continue current regimen. Continue to monitor. Rechecking CMP. Call with any concerns.

## 2015-07-14 ENCOUNTER — Telehealth: Payer: Self-pay | Admitting: Family Medicine

## 2015-07-14 ENCOUNTER — Encounter: Payer: Self-pay | Admitting: Family Medicine

## 2015-07-14 LAB — CBC WITH DIFFERENTIAL/PLATELET
BASOS ABS: 0 10*3/uL (ref 0.0–0.2)
Basos: 0 %
EOS (ABSOLUTE): 0.2 10*3/uL (ref 0.0–0.4)
Eos: 3 %
Hematocrit: 32.6 % — ABNORMAL LOW (ref 34.0–46.6)
Hemoglobin: 11 g/dL — ABNORMAL LOW (ref 11.1–15.9)
IMMATURE GRANS (ABS): 0 10*3/uL (ref 0.0–0.1)
IMMATURE GRANULOCYTES: 0 %
LYMPHS: 21 %
Lymphocytes Absolute: 1.5 10*3/uL (ref 0.7–3.1)
MCH: 31.2 pg (ref 26.6–33.0)
MCHC: 33.7 g/dL (ref 31.5–35.7)
MCV: 92 fL (ref 79–97)
Monocytes Absolute: 0.5 10*3/uL (ref 0.1–0.9)
Monocytes: 7 %
NEUTROS ABS: 5 10*3/uL (ref 1.4–7.0)
NEUTROS PCT: 69 %
PLATELETS: 273 10*3/uL (ref 150–379)
RBC: 3.53 x10E6/uL — ABNORMAL LOW (ref 3.77–5.28)
RDW: 13.6 % (ref 12.3–15.4)
WBC: 7.1 10*3/uL (ref 3.4–10.8)

## 2015-07-14 LAB — COMPREHENSIVE METABOLIC PANEL
ALT: 24 IU/L (ref 0–32)
AST: 21 IU/L (ref 0–40)
Albumin/Globulin Ratio: 1.8 (ref 1.2–2.2)
Albumin: 4.2 g/dL (ref 3.6–4.8)
Alkaline Phosphatase: 103 IU/L (ref 39–117)
BILIRUBIN TOTAL: 0.4 mg/dL (ref 0.0–1.2)
BUN/Creatinine Ratio: 15 (ref 12–28)
BUN: 14 mg/dL (ref 8–27)
CHLORIDE: 99 mmol/L (ref 96–106)
CO2: 23 mmol/L (ref 18–29)
Calcium: 9.5 mg/dL (ref 8.7–10.3)
Creatinine, Ser: 0.91 mg/dL (ref 0.57–1.00)
GFR calc non Af Amer: 65 mL/min/{1.73_m2} (ref >59)
GFR, EST AFRICAN AMERICAN: 75 mL/min/{1.73_m2} (ref >59)
GLUCOSE: 96 mg/dL (ref 65–99)
Globulin, Total: 2.4 g/dL (ref 1.5–4.5)
Potassium: 5.2 mmol/L (ref 3.5–5.2)
Sodium: 136 mmol/L (ref 134–144)
TOTAL PROTEIN: 6.6 g/dL (ref 6.0–8.5)

## 2015-07-14 LAB — TSH: TSH: 1.54 u[IU]/mL (ref 0.450–4.500)

## 2015-07-14 NOTE — Telephone Encounter (Signed)
Called and LMOM. Her labs look good except she's a bit anemic. She should start OTC iron daily and we'll recheck in December. Thanks! OK to tell her this if she calls.

## 2015-07-14 NOTE — Telephone Encounter (Signed)
Gave patient her results.

## 2015-07-15 LAB — LIPID PANEL PICCOLO, WAIVED
CHOLESTEROL PICCOLO, WAIVED: 150 mg/dL (ref <200)
Chol/HDL Ratio Piccolo,Waive: 2.2 mg/dL
HDL CHOL PICCOLO, WAIVED: 67 mg/dL (ref >59)
LDL Chol Calc Piccolo Waived: 62 mg/dL (ref <100)
Triglycerides Piccolo,Waived: 102 mg/dL (ref <150)
VLDL Chol Calc Piccolo,Waive: 20 mg/dL (ref <30)

## 2015-07-15 LAB — MICROSCOPIC EXAMINATION

## 2015-07-15 LAB — MICROALBUMIN, URINE WAIVED
CREATININE, URINE WAIVED: 50 mg/dL (ref 10–300)
Microalb, Ur Waived: 10 mg/L (ref 0–19)

## 2015-07-15 LAB — UA/M W/RFLX CULTURE, ROUTINE
Bilirubin, UA: NEGATIVE
GLUCOSE, UA: NEGATIVE
Ketones, UA: NEGATIVE
NITRITE UA: NEGATIVE
PROTEIN UA: NEGATIVE
UUROB: 0.2 mg/dL (ref 0.2–1.0)
pH, UA: 5 (ref 5.0–7.5)

## 2015-07-15 LAB — URINE CULTURE, REFLEX: Organism ID, Bacteria: NO GROWTH

## 2015-07-29 DIAGNOSIS — R490 Dysphonia: Secondary | ICD-10-CM | POA: Diagnosis not present

## 2015-07-29 DIAGNOSIS — D3701 Neoplasm of uncertain behavior of lip: Secondary | ICD-10-CM | POA: Diagnosis not present

## 2015-08-10 DIAGNOSIS — K1379 Other lesions of oral mucosa: Secondary | ICD-10-CM | POA: Diagnosis not present

## 2015-08-10 DIAGNOSIS — D3701 Neoplasm of uncertain behavior of lip: Secondary | ICD-10-CM | POA: Diagnosis not present

## 2015-08-10 DIAGNOSIS — D230 Other benign neoplasm of skin of lip: Secondary | ICD-10-CM | POA: Diagnosis not present

## 2015-09-14 DIAGNOSIS — I10 Essential (primary) hypertension: Secondary | ICD-10-CM | POA: Diagnosis not present

## 2015-09-14 DIAGNOSIS — I6523 Occlusion and stenosis of bilateral carotid arteries: Secondary | ICD-10-CM | POA: Diagnosis not present

## 2015-12-15 DIAGNOSIS — E039 Hypothyroidism, unspecified: Secondary | ICD-10-CM | POA: Diagnosis not present

## 2015-12-21 DIAGNOSIS — E039 Hypothyroidism, unspecified: Secondary | ICD-10-CM | POA: Diagnosis not present

## 2016-01-11 ENCOUNTER — Ambulatory Visit (INDEPENDENT_AMBULATORY_CARE_PROVIDER_SITE_OTHER): Payer: Medicare Other | Admitting: Family Medicine

## 2016-01-11 ENCOUNTER — Other Ambulatory Visit: Payer: Self-pay | Admitting: Family Medicine

## 2016-01-11 ENCOUNTER — Encounter: Payer: Self-pay | Admitting: Family Medicine

## 2016-01-11 VITALS — BP 167/80 | HR 85 | Temp 98.4°F | Ht 67.3 in | Wt 168.2 lb

## 2016-01-11 DIAGNOSIS — Z0000 Encounter for general adult medical examination without abnormal findings: Secondary | ICD-10-CM

## 2016-01-11 DIAGNOSIS — E782 Mixed hyperlipidemia: Secondary | ICD-10-CM | POA: Diagnosis not present

## 2016-01-11 DIAGNOSIS — H5213 Myopia, bilateral: Secondary | ICD-10-CM | POA: Diagnosis not present

## 2016-01-11 DIAGNOSIS — R042 Hemoptysis: Secondary | ICD-10-CM

## 2016-01-11 DIAGNOSIS — E034 Atrophy of thyroid (acquired): Secondary | ICD-10-CM

## 2016-01-11 DIAGNOSIS — R1013 Epigastric pain: Secondary | ICD-10-CM | POA: Diagnosis not present

## 2016-01-11 DIAGNOSIS — Z720 Tobacco use: Secondary | ICD-10-CM | POA: Diagnosis not present

## 2016-01-11 DIAGNOSIS — J449 Chronic obstructive pulmonary disease, unspecified: Secondary | ICD-10-CM

## 2016-01-11 DIAGNOSIS — R918 Other nonspecific abnormal finding of lung field: Secondary | ICD-10-CM

## 2016-01-11 DIAGNOSIS — I10 Essential (primary) hypertension: Secondary | ICD-10-CM

## 2016-01-11 DIAGNOSIS — Z1211 Encounter for screening for malignant neoplasm of colon: Secondary | ICD-10-CM

## 2016-01-11 DIAGNOSIS — Z9889 Other specified postprocedural states: Secondary | ICD-10-CM | POA: Diagnosis not present

## 2016-01-11 DIAGNOSIS — Z23 Encounter for immunization: Secondary | ICD-10-CM

## 2016-01-11 DIAGNOSIS — Z0001 Encounter for general adult medical examination with abnormal findings: Secondary | ICD-10-CM

## 2016-01-11 LAB — LIPID PANEL PICCOLO, WAIVED
CHOL/HDL RATIO PICCOLO,WAIVE: 2.3 mg/dL
Cholesterol Piccolo, Waived: 146 mg/dL (ref <200)
HDL Chol Piccolo, Waived: 65 mg/dL (ref >59)
LDL Chol Calc Piccolo Waived: 46 mg/dL (ref <100)
TRIGLYCERIDES PICCOLO,WAIVED: 175 mg/dL — AB (ref <150)
VLDL CHOL CALC PICCOLO,WAIVE: 35 mg/dL — AB (ref <30)

## 2016-01-11 LAB — MICROALBUMIN, URINE WAIVED
CREATININE, URINE WAIVED: 50 mg/dL (ref 10–300)
MICROALB, UR WAIVED: 10 mg/L (ref 0–19)

## 2016-01-11 LAB — UA/M W/RFLX CULTURE, ROUTINE
Bilirubin, UA: NEGATIVE
Glucose, UA: NEGATIVE
KETONES UA: NEGATIVE
Nitrite, UA: NEGATIVE
Protein, UA: NEGATIVE
Urobilinogen, Ur: 0.2 mg/dL (ref 0.2–1.0)
pH, UA: 5.5 (ref 5.0–7.5)

## 2016-01-11 LAB — MICROSCOPIC EXAMINATION

## 2016-01-11 MED ORDER — LISINOPRIL 10 MG PO TABS: ORAL_TABLET | ORAL | 1 refills | Status: DC

## 2016-01-11 MED ORDER — BUDESONIDE-FORMOTEROL FUMARATE 160-4.5 MCG/ACT IN AERO: 2.0000 | INHALATION_SPRAY | Freq: Two times a day (BID) | RESPIRATORY_TRACT | 4 refills | Status: DC

## 2016-01-11 MED ORDER — SUCRALFATE 1 GM/10ML PO SUSP: 1.0000 g | Freq: Three times a day (TID) | ORAL | 0 refills | Status: DC

## 2016-01-11 MED ORDER — OMEPRAZOLE 20 MG PO CPDR: 20.0000 mg | DELAYED_RELEASE_CAPSULE | Freq: Every day | ORAL | 3 refills | Status: DC

## 2016-01-11 MED ORDER — ATORVASTATIN CALCIUM 20 MG PO TABS: ORAL_TABLET | ORAL | 1 refills | Status: DC

## 2016-01-11 NOTE — Patient Instructions (Addendum)
Preventative Services:  Health Risk Assessment and Personalized Prevention Plan: done today Bone Mass Measurements: Wants to hold off right now Breast Cancer Screening: Wants to hold off right now CVD Screening: Done today Cervical Cancer Screening: N/A Colon Cancer Screening: Ordered today Depression Screening: Done today Diabetes Screening:  Done today Glaucoma Screening: See your eye doctor Hepatitis B vaccine: N/A Hepatitis C screening: Declined HIV Screening: Declined Flu Vaccine: Done today Lung cancer Screening: Done previously Obesity Screening: Done today Pneumonia Vaccines (2): Up to date STI Screening: N/A Influenza (Flu) Vaccine (Inactivated or Recombinant): What You Need to Know 1. Why get vaccinated? Influenza ("flu") is a contagious disease that spreads around the [place] every year, usually between October and May. Flu is caused by influenza viruses, and is spread mainly by coughing, sneezing, and close contact. Anyone can get flu. Flu strikes suddenly and can last several days. Symptoms vary by age, but can include:  fever/chills  sore throat  muscle aches  fatigue  cough  headache  runny or stuffy nose Flu can also lead to pneumonia and blood infections, and cause diarrhea and seizures in children. If you have a medical condition, such as heart or lung disease, flu can make it worse. Flu is more dangerous for some people. Infants and young children, people 28 years of age and older, pregnant women, and people with certain health conditions or a weakened immune system are at greatest risk. Each year thousands of people in the [place] die from flu, and many more are hospitalized. Flu vaccine can:  keep you from getting flu,  make flu less severe if you do get it, and  keep you from spreading flu to your family and other people. 2. Inactivated and recombinant flu vaccines A dose of flu vaccine is recommended every flu season. Children 6  months through 72 years of age may need two doses during the same flu season. Everyone else needs only one dose each flu season. Some inactivated flu vaccines contain a very small amount of a mercury-based preservative called thimerosal. Studies have not shown thimerosal in vaccines to be harmful, but flu vaccines that do not contain thimerosal are available. There is no live flu virus in flu shots. They cannot cause the flu. There are many flu viruses, and they are always changing. Each year a new flu vaccine is made to protect against three or four viruses that are likely to cause disease in the upcoming flu season. But even when the vaccine doesn't exactly match these viruses, it may still provide some protection. Flu vaccine cannot prevent:  flu that is caused by a virus not covered by the vaccine, or  illnesses that look like flu but are not. It takes about 2 weeks for protection to develop after vaccination, and protection lasts through the flu season. 3. Some people should not get this vaccine Tell the person who is giving you the vaccine:  If you have any severe, life-threatening allergies. If you ever had a life-threatening allergic reaction after a dose of flu vaccine, or have a severe allergy to any part of this vaccine, you may be advised not to get vaccinated. Most, but not all, types of flu vaccine contain a small amount of egg protein.  If you ever had Guillain-Barr Syndrome (also called GBS). Some people with a history of GBS should not get this vaccine. This should be discussed with your doctor.  If you are not feeling well. It is usually okay to get  flu vaccine when you have a mild illness, but you might be asked to come back when you feel better. 4. Risks of a vaccine reaction With any medicine, including vaccines, there is a chance of reactions. These are usually mild and go away on their own, but serious reactions are also possible. Most people who get a flu shot do not have  any problems with it. Minor problems following a flu shot include:  soreness, redness, or swelling where the shot was given  hoarseness  sore, red or itchy eyes  cough  fever  aches  headache  itching  fatigue If these problems occur, they usually begin soon after the shot and last 1 or 2 days. More serious problems following a flu shot can include the following:  There may be a small increased risk of Guillain-Barre Syndrome (GBS) after inactivated flu vaccine. This risk has been estimated at 1 or 2 additional cases per million people vaccinated. This is much lower than the risk of severe complications from flu, which can be prevented by flu vaccine.  Young children who get the flu shot along with pneumococcal vaccine (PCV13) and/or DTaP vaccine at the same time might be slightly more likely to have a seizure caused by fever. Ask your doctor for more information. Tell your doctor if a child who is getting flu vaccine has ever had a seizure. Problems that could happen after any injected vaccine:  People sometimes faint after a medical procedure, including vaccination. Sitting or lying down for about 15 minutes can help prevent fainting, and injuries caused by a fall. Tell your doctor if you feel dizzy, or have vision changes or ringing in the ears.  Some people get severe pain in the shoulder and have difficulty moving the arm where a shot was given. This happens very rarely.  Any medication can cause a severe allergic reaction. Such reactions from a vaccine are very rare, estimated at about 1 in a million doses, and would happen within a few minutes to a few hours after the vaccination. As with any medicine, there is a very remote chance of a vaccine causing a serious injury or death. The safety of vaccines is always being monitored. For more information, visit: http://www.aguilar.org/ 5. What if there is a serious reaction? What should I look for? Look for anything that  concerns you, such as signs of a severe allergic reaction, very high fever, or unusual behavior. Signs of a severe allergic reaction can include hives, swelling of the face and throat, difficulty breathing, a fast heartbeat, dizziness, and weakness. These would start a few minutes to a few hours after the vaccination. What should I do?  If you think it is a severe allergic reaction or other emergency that can't wait, call 9-1-1 and get the person to the nearest hospital. Otherwise, call your doctor.  Reactions should be reported to the Vaccine Adverse Event Reporting System (VAERS). Your doctor should file this report, or you can do it yourself through the VAERS web site at www.vaers.SamedayNews.es, or by calling (650)514-0094.  VAERS does not give medical advice. 6. The National Vaccine Injury Compensation Program The [employer] Vaccine Injury Compensation Program (VICP) is a federal program that was created to compensate people who may have been injured by certain vaccines. Persons who believe they may have been injured by a vaccine can learn about the program and about filing a claim by calling (541) 341-4127 or visiting the [place] website at GoldCloset.com.ee. There is a time limit to  file a claim for compensation. 7. How can I learn more?  Ask your healthcare provider. He or she can give you the vaccine package insert or suggest other sources of information.  Call your local or state health department.  Contact the Centers for Disease Control and Prevention (CDC):  Call 862-478-3426 (1-800-CDC-INFO) or  Visit CDC's website at https://gibson.com/ Vaccine Information Statement, Inactivated Influenza Vaccine (09/05/2013) This information is not intended to replace advice given to you by your health care provider. Make sure you discuss any questions you have with your health care provider. Document Released: 11/10/2005 Document Revised: 10/07/2015 Document Reviewed: 10/07/2015 Elsevier  Interactive Patient Education  2017 Echo Maintenance, Female Introduction Adopting a healthy lifestyle and getting preventive care can go a long way to promote health and wellness. Talk with your health care provider about what schedule of regular examinations is right for you. This is a good chance for you to check in with your provider about disease prevention and staying healthy. In between checkups, there are plenty of things you can do on your own. Experts have done a lot of research about which lifestyle changes and preventive measures are most likely to keep you healthy. Ask your health care provider for more information. Weight and diet Eat a healthy diet  Be sure to include plenty of vegetables, fruits, low-fat dairy products, and lean protein.  Do not eat a lot of foods high in solid fats, added sugars, or salt.  Get regular exercise. This is one of the most important things you can do for your health.  Most adults should exercise for at least 150 minutes each week. The exercise should increase your heart rate and make you sweat (moderate-intensity exercise).  Most adults should also do strengthening exercises at least twice a week. This is in addition to the moderate-intensity exercise. Maintain a healthy weight  Body mass index (BMI) is a measurement that can be used to identify possible weight problems. It estimates body fat based on height and weight. Your health care provider can help determine your BMI and help you achieve or maintain a healthy weight.  For females 86 years of age and older:  A BMI below 18.5 is considered underweight.  A BMI of 18.5 to 24.9 is normal.  A BMI of 25 to 29.9 is considered overweight.  A BMI of 30 and above is considered obese. Watch levels of cholesterol and blood lipids  You should start having your blood tested for lipids and cholesterol at 68 years of age, then have this test every 5 years.  You may need to have  your cholesterol levels checked more often if:  Your lipid or cholesterol levels are high.  You are older than 68 years of age.  You are at high risk for heart disease. Cancer screening Lung Cancer  Lung cancer screening is recommended for adults 35-32 years old who are at high risk for lung cancer because of a history of smoking.  A yearly low-dose CT scan of the lungs is recommended for people who:  Currently smoke.  Have quit within the past 15 years.  Have at least a 30-pack-year history of smoking. A pack year is smoking an average of one pack of cigarettes a day for 1 year.  Yearly screening should continue until it has been 15 years since you quit.  Yearly screening should stop if you develop a health problem that would prevent you from having lung cancer treatment. Breast Cancer  Practice  breast self-awareness. This means understanding how your breasts normally appear and feel.  It also means doing regular breast self-exams. Let your health care provider know about any changes, no matter how small.  If you are in your 20s or 30s, you should have a clinical breast exam (CBE) by a health care provider every 1-3 years as part of a regular health exam.  If you are 63 or older, have a CBE every year. Also consider having a breast X-ray (mammogram) every year.  If you have a family history of breast cancer, talk to your health care provider about genetic screening.  If you are at high risk for breast cancer, talk to your health care provider about having an MRI and a mammogram every year.  Breast cancer gene (BRCA) assessment is recommended for women who have family members with BRCA-related cancers. BRCA-related cancers include:  Breast.  Ovarian.  Tubal.  Peritoneal cancers.  Results of the assessment will determine the need for genetic counseling and BRCA1 and BRCA2 testing. Cervical Cancer  Your health care provider may recommend that you be screened regularly  for cancer of the pelvic organs (ovaries, uterus, and vagina). This screening involves a pelvic examination, including checking for microscopic changes to the surface of your cervix (Pap test). You may be encouraged to have this screening done every 3 years, beginning at age 52.  For women ages 43-65, health care providers may recommend pelvic exams and Pap testing every 3 years, or they may recommend the Pap and pelvic exam, combined with testing for human papilloma virus (HPV), every 5 years. Some types of HPV increase your risk of cervical cancer. Testing for HPV may also be done on women of any age with unclear Pap test results.  Other health care providers may not recommend any screening for nonpregnant women who are considered low risk for pelvic cancer and who do not have symptoms. Ask your health care provider if a screening pelvic exam is right for you.  If you have had past treatment for cervical cancer or a condition that could lead to cancer, you need Pap tests and screening for cancer for at least 20 years after your treatment. If Pap tests have been discontinued, your risk factors (such as having a new sexual partner) need to be reassessed to determine if screening should resume. Some women have medical problems that increase the chance of getting cervical cancer. In these cases, your health care provider may recommend more frequent screening and Pap tests. Colorectal Cancer  This type of cancer can be detected and often prevented.  Routine colorectal cancer screening usually begins at 68 years of age and continues through 68 years of age.  Your health care provider may recommend screening at an earlier age if you have risk factors for colon cancer.  Your health care provider may also recommend using home test kits to check for hidden blood in the stool.  A small camera at the end of a tube can be used to examine your colon directly (sigmoidoscopy or colonoscopy). This is done to check  for the earliest forms of colorectal cancer.  Routine screening usually begins at age 80.  Direct examination of the colon should be repeated every 5-10 years through 68 years of age. However, you may need to be screened more often if early forms of precancerous polyps or small growths are found. Skin Cancer  Check your skin from head to toe regularly.  Tell your health care provider about any  new moles or changes in moles, especially if there is a change in a mole's shape or color.  Also tell your health care provider if you have a mole that is larger than the size of a pencil eraser.  Always use sunscreen. Apply sunscreen liberally and repeatedly throughout the day.  Protect yourself by wearing long sleeves, pants, a wide-brimmed hat, and sunglasses whenever you are outside. Heart disease, diabetes, and high blood pressure  High blood pressure causes heart disease and increases the risk of stroke. High blood pressure is more likely to develop in:  People who have blood pressure in the high end of the normal range (130-139/85-89 mm Hg).  People who are overweight or obese.  People who are African American.  If you are 75-78 years of age, have your blood pressure checked every 3-5 years. If you are 18 years of age or older, have your blood pressure checked every year. You should have your blood pressure measured twice-once when you are at a hospital or clinic, and once when you are not at a hospital or clinic. Record the average of the two measurements. To check your blood pressure when you are not at a hospital or clinic, you can use:  An automated blood pressure machine at a pharmacy.  A home blood pressure monitor.  If you are between 32 years and 64 years old, ask your health care provider if you should take aspirin to prevent strokes.  Have regular diabetes screenings. This involves taking a blood sample to check your fasting blood sugar level.  If you are at a normal weight  and have a low risk for diabetes, have this test once every three years after 68 years of age.  If you are overweight and have a high risk for diabetes, consider being tested at a younger age or more often. Preventing infection Hepatitis B  If you have a higher risk for hepatitis B, you should be screened for this virus. You are considered at high risk for hepatitis B if:  You were born in a country where hepatitis B is common. Ask your health care provider which countries are considered high risk.  Your parents were born in a high-risk country, and you have not been immunized against hepatitis B (hepatitis B vaccine).  You have HIV or AIDS.  You use needles to inject street drugs.  You live with someone who has hepatitis B.  You have had sex with someone who has hepatitis B.  You get hemodialysis treatment.  You take certain medicines for conditions, including cancer, organ transplantation, and autoimmune conditions. Hepatitis C  Blood testing is recommended for:  Everyone born from [birth year] through 1963.  Anyone with known risk factors for hepatitis C. Sexually transmitted infections (STIs)  You should be screened for sexually transmitted infections (STIs) including gonorrhea and chlamydia if:  You are sexually active and are younger than 68 years of age.  You are older than 68 years of age and your health care provider tells you that you are at risk for this type of infection.  Your sexual activity has changed since you were last screened and you are at an increased risk for chlamydia or gonorrhea. Ask your health care provider if you are at risk.  If you do not have HIV, but are at risk, it may be recommended that you take a prescription medicine daily to prevent HIV infection. This is called pre-exposure prophylaxis (PrEP). You are considered at risk if:  You  are sexually active and do not regularly use condoms or know the HIV status of your partner(s).  You take drugs by  injection.  You are sexually active with a partner who has HIV. Talk with your health care provider about whether you are at high risk of being infected with HIV. If you choose to begin PrEP, you should first be tested for HIV. You should then be tested every 3 months for as long as you are taking PrEP. Pregnancy  If you are premenopausal and you may become pregnant, ask your health care provider about preconception counseling.  If you may become pregnant, take 400 to 800 micrograms (mcg) of folic acid every day.  If you want to prevent pregnancy, talk to your health care provider about birth control (contraception). Osteoporosis and menopause  Osteoporosis is a disease in which the bones lose minerals and strength with aging. This can result in serious bone fractures. Your risk for osteoporosis can be identified using a bone density scan.  If you are 66 years of age or older, or if you are at risk for osteoporosis and fractures, ask your health care provider if you should be screened.  Ask your health care provider whether you should take a calcium or vitamin D supplement to lower your risk for osteoporosis.  Menopause may have certain physical symptoms and risks.  Hormone replacement therapy may reduce some of these symptoms and risks. Talk to your health care provider about whether hormone replacement therapy is right for you. Follow these instructions at home:  Schedule regular health, dental, and eye exams.  Stay current with your immunizations.  Do not use any tobacco products including cigarettes, chewing tobacco, or electronic cigarettes.  If you are pregnant, do not drink alcohol.  If you are breastfeeding, limit how much and how often you drink alcohol.  Limit alcohol intake to no more than 1 drink per day for nonpregnant women. One drink equals 12 ounces of beer, 5 ounces of wine, or 1 ounces of hard liquor.  Do not use street drugs.  Do not share needles.  Ask  your health care provider for help if you need support or information about quitting drugs.  Tell your health care provider if you often feel depressed.  Tell your health care provider if you have ever been abused or do not feel safe at home. This information is not intended to replace advice given to you by your health care provider. Make sure you discuss any questions you have with your health care provider. Document Released: 08/01/2010 Document Revised: 06/24/2015 Document Reviewed: 10/20/2014  2017 Elsevier  Health Maintenance for Postmenopausal Women Introduction Menopause is a normal process in which your reproductive ability comes to an end. This process happens gradually over a span of months to years, usually between the ages of 84 and 45. Menopause is complete when you have missed 12 consecutive menstrual periods. It is important to talk with your health care provider about some of the most common conditions that affect postmenopausal women, such as heart disease, cancer, and bone loss (osteoporosis). Adopting a healthy lifestyle and getting preventive care can help to promote your health and wellness. Those actions can also lower your chances of developing some of these common conditions. What should I know about menopause? During menopause, you may experience a number of symptoms, such as:  Moderate-to-severe hot flashes.  Night sweats.  Decrease in sex drive.  Mood swings.  Headaches.  Tiredness.  Irritability.  Memory problems.  Insomnia. Choosing to treat or not to treat menopausal changes is an individual decision that you make with your health care provider. What should I know about hormone replacement therapy and supplements? Hormone therapy products are effective for treating symptoms that are associated with menopause, such as hot flashes and night sweats. Hormone replacement carries certain risks, especially as you become older. If you are thinking about  using estrogen or estrogen with progestin treatments, discuss the benefits and risks with your health care provider. What should I know about heart disease and stroke? Heart disease, heart attack, and stroke become more likely as you age. This may be due, in part, to the hormonal changes that your body experiences during menopause. These can affect how your body processes dietary fats, triglycerides, and cholesterol. Heart attack and stroke are both medical emergencies. There are many things that you can do to help prevent heart disease and stroke:  Have your blood pressure checked at least every 1-2 years. High blood pressure causes heart disease and increases the risk of stroke.  If you are 57-14 years old, ask your health care provider if you should take aspirin to prevent a heart attack or a stroke.  Do not use any tobacco products, including cigarettes, chewing tobacco, or electronic cigarettes. If you need help quitting, ask your health care provider.  It is important to eat a healthy diet and maintain a healthy weight.  Be sure to include plenty of vegetables, fruits, low-fat dairy products, and lean protein.  Avoid eating foods that are high in solid fats, added sugars, or salt (sodium).  Get regular exercise. This is one of the most important things that you can do for your health.  Try to exercise for at least 150 minutes each week. The type of exercise that you do should increase your heart rate and make you sweat. This is known as moderate-intensity exercise.  Try to do strengthening exercises at least twice each week. Do these in addition to the moderate-intensity exercise.  Know your numbers.Ask your health care provider to check your cholesterol and your blood glucose. Continue to have your blood tested as directed by your health care provider. What should I know about cancer screening? There are several types of cancer. Take the following steps to reduce your risk and to  catch any cancer development as early as possible. Breast Cancer  Practice breast self-awareness.  This means understanding how your breasts normally appear and feel.  It also means doing regular breast self-exams. Let your health care provider know about any changes, no matter how small.  If you are 29 or older, have a clinician do a breast exam (clinical breast exam or CBE) every year. Depending on your age, family history, and medical history, it may be recommended that you also have a yearly breast X-ray (mammogram).  If you have a family history of breast cancer, talk with your health care provider about genetic screening.  If you are at high risk for breast cancer, talk with your health care provider about having an MRI and a mammogram every year.  Breast cancer (BRCA) gene test is recommended for women who have family members with BRCA-related cancers. Results of the assessment will determine the need for genetic counseling and BRCA1 and for BRCA2 testing. BRCA-related cancers include these types:  Breast. This occurs in males or females.  Ovarian.  Tubal. This may also be called fallopian tube cancer.  Cancer of the abdominal or pelvic lining (peritoneal cancer).  Prostate.  Pancreatic. Cervical, Uterine, and Ovarian Cancer  Your health care provider may recommend that you be screened regularly for cancer of the pelvic organs. These include your ovaries, uterus, and vagina. This screening involves a pelvic exam, which includes checking for microscopic changes to the surface of your cervix (Pap test).  For women ages 21-65, health care providers may recommend a pelvic exam and a Pap test every three years. For women ages 29-65, they may recommend the Pap test and pelvic exam, combined with testing for human papilloma virus (HPV), every five years. Some types of HPV increase your risk of cervical cancer. Testing for HPV may also be done on women of any age who have unclear Pap  test results.  Other health care providers may not recommend any screening for nonpregnant women who are considered low risk for pelvic cancer and have no symptoms. Ask your health care provider if a screening pelvic exam is right for you.  If you have had past treatment for cervical cancer or a condition that could lead to cancer, you need Pap tests and screening for cancer for at least 20 years after your treatment. If Pap tests have been discontinued for you, your risk factors (such as having a new sexual partner) need to be reassessed to determine if you should start having screenings again. Some women have medical problems that increase the chance of getting cervical cancer. In these cases, your health care provider may recommend that you have screening and Pap tests more often.  If you have a family history of uterine cancer or ovarian cancer, talk with your health care provider about genetic screening.  If you have vaginal bleeding after reaching menopause, tell your health care provider.  There are currently no reliable tests available to screen for ovarian cancer. Lung Cancer  Lung cancer screening is recommended for adults 62-84 years old who are at high risk for lung cancer because of a history of smoking. A yearly low-dose CT scan of the lungs is recommended if you:  Currently smoke.  Have a history of at least 30 pack-years of smoking and you currently smoke or have quit within the past 15 years. A pack-year is smoking an average of one pack of cigarettes per day for one year. Yearly screening should:  Continue until it has been 15 years since you quit.  Stop if you develop a health problem that would prevent you from having lung cancer treatment. Colorectal Cancer  This type of cancer can be detected and can often be prevented.  Routine colorectal cancer screening usually begins at age 28 and continues through age 56.  If you have risk factors for colon cancer, your health  care provider may recommend that you be screened at an earlier age.  If you have a family history of colorectal cancer, talk with your health care provider about genetic screening.  Your health care provider may also recommend using home test kits to check for hidden blood in your stool.  A small camera at the end of a tube can be used to examine your colon directly (sigmoidoscopy or colonoscopy). This is done to check for the earliest forms of colorectal cancer.  Direct examination of the colon should be repeated every 5-10 years until age 64. However, if early forms of precancerous polyps or small growths are found or if you have a family history or genetic risk for colorectal cancer, you may need to be screened more often. Skin Cancer  Check  your skin from head to toe regularly.  Monitor any moles. Be sure to tell your health care provider:  About any new moles or changes in moles, especially if there is a change in a mole's shape or color.  If you have a mole that is larger than the size of a pencil eraser.  If any of your family members has a history of skin cancer, especially at a young age, talk with your health care provider about genetic screening.  Always use sunscreen. Apply sunscreen liberally and repeatedly throughout the day.  Whenever you are outside, protect yourself by wearing long sleeves, pants, a wide-brimmed hat, and sunglasses. What should I know about osteoporosis? Osteoporosis is a condition in which bone destruction happens more quickly than new bone creation. After menopause, you may be at an increased risk for osteoporosis. To help prevent osteoporosis or the bone fractures that can happen because of osteoporosis, the following is recommended:  If you are 55-51 years old, get at least 1,000 mg of calcium and at least 600 mg of vitamin D per day.  If you are older than age 71 but younger than age 44, get at least 1,200 mg of calcium and at least 600 mg of  vitamin D per day.  If you are older than age 32, get at least 1,200 mg of calcium and at least 800 mg of vitamin D per day. Smoking and excessive alcohol intake increase the risk of osteoporosis. Eat foods that are rich in calcium and vitamin D, and do weight-bearing exercises several times each week as directed by your health care provider. What should I know about how menopause affects my mental health? Depression may occur at any age, but it is more common as you become older. Common symptoms of depression include:  Low or sad mood.  Changes in sleep patterns.  Changes in appetite or eating patterns.  Feeling an overall lack of motivation or enjoyment of activities that you previously enjoyed.  Frequent crying spells. Talk with your health care provider if you think that you are experiencing depression. What should I know about immunizations? It is important that you get and maintain your immunizations. These include:  Tetanus, diphtheria, and pertussis (Tdap) booster vaccine.  Influenza every year before the flu season begins.  Pneumonia vaccine.  Shingles vaccine. Your health care provider may also recommend other immunizations. This information is not intended to replace advice given to you by your health care provider. Make sure you discuss any questions you have with your health care provider. Document Released: 03/10/2005 Document Revised: 08/06/2015 Document Reviewed: 10/20/2014  2017 Elsevier

## 2016-01-11 NOTE — Assessment & Plan Note (Signed)
Stable. Continue to follow with pulmonology. Call with any concerns.  

## 2016-01-11 NOTE — Assessment & Plan Note (Addendum)
No better on recheck. Will work on [employer]. Continue current regimen. Continue to monitor. Recheck 1 month at follow up.

## 2016-01-11 NOTE — Assessment & Plan Note (Signed)
Encouraged patient to quit smoking. We are here when she is ready.

## 2016-01-11 NOTE — Assessment & Plan Note (Addendum)
Not taking her inhaler. Wheezing. Restart inhaler. Recheck 1 month.

## 2016-01-11 NOTE — Assessment & Plan Note (Signed)
Continue to follow with vascular. Will try to keep her BP and cholesterol as low as we can.

## 2016-01-11 NOTE — Assessment & Plan Note (Signed)
Rechecking levels today. Will adjust dose as needed. Call with any concerns.  

## 2016-01-11 NOTE — Assessment & Plan Note (Signed)
Does not want to see eye doctor right now. Will discuss it again next visit.

## 2016-01-11 NOTE — Assessment & Plan Note (Signed)
Rechecking levels today. Continue current regimen. Continue to monitor. Call with any concerns.

## 2016-01-11 NOTE — Progress Notes (Signed)
BP (!) 167/80 (BP Location: Left Arm, Cuff Size: Normal)   Pulse 85   Temp 98.4 F (36.9 C)   Ht 5' 7.3" (1.709 m)   Wt 168 lb 3.2 oz (76.3 kg)   SpO2 100%   BMI 26.11 kg/m    Subjective:    Patient ID: Mary Hoover, female    DOB: [birth year], 70 y.o.   MRN: [id number]  HPI: Mary Hoover is a 70 y.o. female presenting on 01/11/2016 for comprehensive medical examination. Current medical complaints include:  ABDOMINAL PAIN  Duration: Couple of weeks Onset: sudden Severity: severe Quality: burning and cramping Location:  LUQ  Episode duration: Hour+ Radiation: no Frequency: intermittent Alleviating factors: zantac Aggravating factors: eating Status: fluctuating Treatments attempted: H2 Blocker Fever: no Nausea: no Vomiting: no Weight loss: no Decreased appetite: no Diarrhea: yes Constipation: no Blood in stool: no Heartburn: no Jaundice: no Rash: no Dysuria/urinary frequency: no Hematuria: no History of sexually transmitted disease: no Recurrent NSAID use: yes- not every day  HYPERTENSION / HYPERLIPIDEMIA- lost her son's girlfriend on Friday, has been under a lot of stress Satisfied with current treatment? no Duration of hypertension: chronic BP monitoring frequency: not checking BP medication side effects: no Past BP meds: lisinopril Duration of hyperlipidemia: chronic Cholesterol medication side effects: no Cholesterol supplements: none Past cholesterol medications: atorvastain (lipitor) Medication compliance: excellent compliance Aspirin: yes Recent stressors: yes Recurrent headaches: no Visual changes: no Palpitations: no Dyspnea: no Chest pain: no Lower extremity edema: no Dizzy/lightheaded: no  HYPOTHYROIDISM Thyroid control status:controlled Satisfied with current treatment? no Medication side effects: no Medication compliance: excellent compliance Recent dose adjustment:no Fatigue: no Cold intolerance: no Heat intolerance:  no Weight gain: no Weight loss: no Constipation: no Diarrhea/loose stools: no Palpitations: no Lower extremity edema: no Anxiety/depressed mood: no  She currently lives with: husband Menopausal Symptoms: no  Functional Status Survey: Is the patient deaf or have difficulty hearing?: Yes Does the patient have difficulty seeing, even when wearing glasses/contacts?: Yes Does the patient have difficulty concentrating, remembering, or making decisions?: Yes Does the patient have difficulty walking or climbing stairs?: No Does the patient have difficulty dressing or bathing?: No Does the patient have difficulty doing errands alone such as visiting a doctor's office or shopping?: No  Fall Risk  01/11/2016 07/13/2015 12/11/2014  Falls in the past year? No No No    Depression Screen Depression screen [facility] 2/9 01/11/2016 07/13/2015  Decreased Interest 0 0  Down, Depressed, Hopeless 0 0  PHQ - 2 Score 0 0   Advanced Directives Does patient have a HCPOA?    no Does patient have a living will or MOST form?  no  Past Medical History:  Past Medical History:  Diagnosis Date  . Carotid atherosclerosis   . Carotid bruit   . COPD (chronic obstructive pulmonary disease) (HCC)    emphysema  . Hyperlipidemia   . Hypertension   . Hypothyroidism   . Infected cat bite 1980s   was hospitalized  . Tobacco use     Surgical History:  Past Surgical History:  Procedure Laterality Date  . CAROTID ENDARTERECTOMY Right Oct. 2015   Dr. Lucky Cowboy  . CAROTID STENOSIS Left April 2016   carotid stenosis surgery  . FLEXIBLE BRONCHOSCOPY N/A 12/17/2014   Procedure: FLEXIBLE BRONCHOSCOPY;  Surgeon: Wilhelmina Mcardle, MD;  Location: [facility] ORS;  Service: Pulmonary;  Laterality: N/A;  . INCISION AND DRAINAGE / EXCISION THYROGLOSSAL CYST  April 2011    Medications:  Current  Outpatient Prescriptions on File Prior to Visit  Medication Sig  . aspirin 81 MG tablet Take 81 mg by mouth daily.  Marland Kitchen levothyroxine  (SYNTHROID, LEVOTHROID) 75 MCG tablet Take 75 mcg by mouth daily.   . vitamin B-12 (CYANOCOBALAMIN) 500 MCG tablet Take 500 mcg by mouth daily.  . diphenhydrAMINE (BENADRYL) 25 MG tablet Take 50 mg by mouth at bedtime as needed for sleep.  Marland Kitchen ipratropium-albuterol (DUONEB) 0.5-2.5 (3) MG/3ML SOLN Take 3 mLs by nebulization every 4 (four) hours. (Patient not taking: Reported on 01/11/2016)   No current facility-administered medications on file prior to visit.     Allergies:  No Known Allergies  Social History:  Social History   Social History  . Marital status: Married    Spouse name: N/A  . Number of children: N/A  . Years of education: N/A   Occupational History  . Not on file.   Social History Main Topics  . Smoking status: Former Smoker    Packs/day: 0.50    Years: 50.00    Types: Cigarettes    Quit date: 05/13/2015  . Smokeless tobacco: Never Used  . Alcohol use No     Comment: occasional  . Drug use: No  . Sexual activity: Not on file   Other Topics Concern  . Not on file   Social History Narrative   Lives at home with husband   History  Smoking Status  . Former Smoker  . Packs/day: 0.50  . Years: 50.00  . Types: Cigarettes  . Quit date: 05/13/2015  Smokeless Tobacco  . Never Used   History  Alcohol Use No    Comment: occasional    Family History:  Family History  Problem Relation Age of Onset  . Congestive Heart Failure Mother   . Heart disease Mother   . Hypertension Mother   . Hypertension Sister   . Diabetes Sister   . Heart disease Sister   . Cancer Brother     liver, lung  . Heart disease Maternal Grandmother   . Heart disease Maternal Uncle   . Stroke Maternal Grandfather   . Heart disease Brother   . Hypertension Brother   . COPD Neg Hx     Past medical history, surgical history, medications, allergies, family history and social history reviewed with patient today and changes made to appropriate areas of the chart.   Review of  Systems  Constitutional: Negative.   HENT: Positive for hearing loss. Negative for congestion, ear discharge, ear pain, nosebleeds, sinus pain, sore throat and tinnitus.   Eyes: Negative.   Respiratory: Negative.  Negative for stridor.   Cardiovascular: Negative.   Gastrointestinal: Positive for abdominal pain, heartburn and nausea. Negative for blood in stool, constipation, diarrhea, melena and vomiting.  Genitourinary: Negative.   Musculoskeletal: Negative.   Skin: Negative.   Neurological: Negative.   Endo/Heme/Allergies: Negative.   Psychiatric/Behavioral: Negative.     All other ROS negative except what is listed above and in the HPI.      Objective:    BP (!) 167/80 (BP Location: Left Arm, Cuff Size: Normal)   Pulse 85   Temp 98.4 F (36.9 C)   Ht 5' 7.3" (1.709 m)   Wt 168 lb 3.2 oz (76.3 kg)   SpO2 100%   BMI 26.11 kg/m   Wt Readings from Last 3 Encounters:  01/11/16 168 lb 3.2 oz (76.3 kg)  07/13/15 147 lb (66.7 kg)  04/05/15 136 lb 9.6 oz (62 kg)  Hearing Screening   '125Hz'$  '250Hz'$  '500Hz'$  '1000Hz'$  '2000Hz'$  '3000Hz'$  '4000Hz'$  '6000Hz'$  '8000Hz'$   Right ear:   40 Fail 40  Fail    Left ear:   Fail Fail Fail  Fail      Visual Acuity Screening   Right eye Left eye Both eyes  Without correction: '20/50 20/50 20/40 '$  With correction:       Physical Exam  Constitutional: She is oriented to person, place, and time. She appears well-developed and well-nourished. No distress.  HENT:  Head: Normocephalic and atraumatic.  Right Ear: Hearing and external ear normal.  Left Ear: Hearing and external ear normal.  Nose: Nose normal.  Mouth/Throat: Oropharynx is clear and moist. No oropharyngeal exudate.  Eyes: Conjunctivae, EOM and lids are normal. Pupils are equal, round, and reactive to light. Right eye exhibits no discharge. Left eye exhibits no discharge. No scleral icterus.  Neck: Normal range of motion. Neck supple. No JVD present. No thyromegaly present.  Cardiovascular: Normal  rate, regular rhythm, normal heart sounds and intact distal pulses.  Exam reveals no gallop and no friction rub.   No murmur heard. Pulmonary/Chest: Effort normal. No stridor. No respiratory distress. She has decreased breath sounds in the right upper field, the right middle field, the right lower field, the left middle field and the left lower field. She has wheezes in the right lower field, the left middle field and the left lower field. She has no rales. She exhibits no tenderness.  Abdominal: Soft. Bowel sounds are normal. She exhibits no distension and no mass. There is tenderness. There is no rebound and no guarding.  Genitourinary:  Genitourinary Comments: Breast and GYN exam deferred with shared decision making  Musculoskeletal: Normal range of motion. She exhibits no edema, tenderness or deformity.  Lymphadenopathy:    She has no cervical adenopathy.  Neurological: She is alert and oriented to person, place, and time. She has normal reflexes. She displays normal reflexes. No cranial nerve deficit. She exhibits normal muscle tone. Coordination normal.  Skin: Skin is warm, dry and intact. No rash noted. She is not diaphoretic. No erythema. No pallor.  Psychiatric: She has a normal mood and affect. Her speech is normal and behavior is normal. Judgment and thought content normal. Cognition and memory are normal.  Nursing note and vitals reviewed.  6CIT Screen 01/11/2016  What Year? 0 points  What month? 0 points  What time? 0 points  Count back from 20 0 points  Months in reverse 2 points  Repeat phrase 2 points  Total Score 4    Results for orders placed or performed in visit on 07/13/15  Microscopic Examination  Result Value Ref Range   WBC, UA 0-5 0 - 5 /hpf   RBC, UA 3-10 (A) 0 - 2 /hpf   Epithelial Cells (non renal) 0-10 0 - 10 /hpf   Bacteria, UA Few None seen/Few  CBC with Differential/Platelet  Result Value Ref Range   WBC 7.1 3.4 - 10.8 x10E3/uL   RBC 3.53 (L) 3.77 -  5.28 x10E6/uL   Hemoglobin 11.0 (L) 11.1 - 15.9 g/dL   Hematocrit 32.6 (L) 34.0 - 46.6 %   MCV 92 79 - 97 fL   MCH 31.2 26.6 - 33.0 pg   MCHC 33.7 31.5 - 35.7 g/dL   RDW 13.6 12.3 - 15.4 %   Platelets 273 150 - 379 x10E3/uL   Neutrophils 69 %   Lymphs 21 %   Monocytes 7 %   Eos 3 %  Basos 0 %   Neutrophils Absolute 5.0 1.4 - 7.0 x10E3/uL   Lymphocytes Absolute 1.5 0.7 - 3.1 x10E3/uL   Monocytes Absolute 0.5 0.1 - 0.9 x10E3/uL   EOS (ABSOLUTE) 0.2 0.0 - 0.4 x10E3/uL   Basophils Absolute 0.0 0.0 - 0.2 x10E3/uL   Immature Granulocytes 0 %   Immature Grans (Abs) 0.0 0.0 - 0.1 x10E3/uL  Comprehensive metabolic panel  Result Value Ref Range   Glucose 96 65 - 99 mg/dL   BUN 14 8 - 27 mg/dL   Creatinine, Ser 0.91 0.57 - 1.00 mg/dL   GFR calc non Af Amer 65 >59 mL/min/1.73   GFR calc Af Amer 75 >59 mL/min/1.73   BUN/Creatinine Ratio 15 12 - 28   Sodium 136 134 - 144 mmol/L   Potassium 5.2 3.5 - 5.2 mmol/L   Chloride 99 96 - 106 mmol/L   CO2 23 18 - 29 mmol/L   Calcium 9.5 8.7 - 10.3 mg/dL   Total Protein 6.6 6.0 - 8.5 g/dL   Albumin 4.2 3.6 - 4.8 g/dL   Globulin, Total 2.4 1.5 - 4.5 g/dL   Albumin/Globulin Ratio 1.8 1.2 - 2.2   Bilirubin Total 0.4 0.0 - 1.2 mg/dL   Alkaline Phosphatase 103 39 - 117 IU/L   AST 21 0 - 40 IU/L   ALT 24 0 - 32 IU/L  Lipid Panel Piccolo, Waived  Result Value Ref Range   Cholesterol Piccolo, Waived 150 <200 mg/dL   HDL Chol Piccolo, Waived 67 >59 mg/dL   Triglycerides Piccolo,Waived 102 <150 mg/dL   Chol/HDL Ratio Piccolo,Waive 2.2 mg/dL   LDL Chol Calc Piccolo Waived 62 <100 mg/dL   VLDL Chol Calc Piccolo,Waive 20 <30 mg/dL  Microalbumin, Urine Waived  Result Value Ref Range   Microalb, Ur Waived 10 0 - 19 mg/L   Creatinine, Urine Waived 50 10 - 300 mg/dL   Microalb/Creat Ratio <30 <30 mg/g  TSH  Result Value Ref Range   TSH 1.540 0.450 - 4.500 uIU/mL  UA/M w/rflx Culture, Routine  Result Value Ref Range   Specific Gravity, UA <1.005 (L)  1.005 - 1.030   pH, UA 5.0 5.0 - 7.5   Color, UA Yellow Yellow   Appearance Ur Cloudy (A) Clear   Leukocytes, UA 2+ (A) Negative   Protein, UA Negative Negative/Trace   Glucose, UA Negative Negative   Ketones, UA Negative Negative   RBC, UA 1+ (A) Negative   Bilirubin, UA Negative Negative   Urobilinogen, Ur 0.2 0.2 - 1.0 mg/dL   Nitrite, UA Negative Negative   Microscopic Examination See below:    Urinalysis Reflex Comment   Urine Culture, Routine  Result Value Ref Range   Urine Culture, Routine Final report    Urine Culture result 1 No growth       Assessment & Plan:   Problem List Items Addressed This Visit      Cardiovascular and Mediastinum   Essential hypertension    No better on recheck. Will work on [employer]. Continue current regimen. Continue to monitor. Recheck 1 month at follow up.       Relevant Medications   lisinopril (PRINIVIL,ZESTRIL) 10 MG tablet   atorvastatin (LIPITOR) 20 MG tablet     Respiratory   COPD (chronic obstructive pulmonary disease) ([place])    Not taking her inhaler. Wheezing. Restart inhaler. Recheck 1 month.      Relevant Medications   budesonide-formoterol (SYMBICORT) 160-4.5 MCG/ACT inhaler     Endocrine   Hypothyroidism  Rechecking levels today. Will adjust dose as needed. Call with any concerns.         Other   Hyperlipidemia    Rechecking levels today. Continue current regimen. Continue to monitor. Call with any concerns.       Relevant Medications   lisinopril (PRINIVIL,ZESTRIL) 10 MG tablet   atorvastatin (LIPITOR) 20 MG tablet   Status post carotid endarterectomy    Continue to follow with vascular. Will try to keep her BP and cholesterol as low as we can.       Tobacco use    Encouraged patient to quit smoking. We are here when she is ready.      Hemoptysis    Stable. Continue to follow with pulmonology. Call with any concerns.       Lung nodules    Stable. Continue to follow with pulmonology. Call with any  concerns.       Myopia of both eyes    Does not want to see eye doctor right now. Will discuss it again next visit.        Other Visit Diagnoses    Medicare annual wellness visit, subsequent    -  Primary   Preventative care discussed with patient as below.    Screening for colon cancer       Ordered colonoscopy today.   Relevant Orders   Ambulatory referral to General Surgery   Immunization due       Flu shot given today.   Relevant Orders   Flu vaccine HIGH DOSE PF (Fluzone High dose) (Completed)   Epigastric pain       Will start her on omeprazole and carafate. Recheck 1 month. Call with any concerns. Recheck 1 month.       Follow up plan: No Follow-up on file.   LABORATORY TESTING:  - Pap smear: not applicable  IMMUNIZATIONS:   - Tdap: Tetanus vaccination status reviewed: last tetanus booster within 10 years. - Influenza: Administered today - Pneumovax: Up to date - Prevnar: Up to date - Zostavax vaccine: Refused  SCREENING: -Mammogram: Refused  - Colonoscopy: Ordered today  - Bone Density: Refused  -Hearing Test: Ordered today  -Spirometry: Done elsewhere   PATIENT COUNSELING:   Advised to take 1 mg of folate supplement per day if capable of pregnancy.   Sexuality: Discussed sexually transmitted diseases, partner selection, use of condoms, avoidance of unintended pregnancy  and contraceptive alternatives.   Advised to avoid cigarette smoking.  I discussed with the patient that most people either abstain from alcohol or drink within safe limits (<=14/week and <=4 drinks/occasion for males, <=7/weeks and <= 3 drinks/occasion for females) and that the risk for alcohol disorders and other health effects rises proportionally with the number of drinks per week and how often a drinker exceeds daily limits.  Discussed cessation/primary prevention of drug use and availability of treatment for abuse.   Diet: Encouraged to adjust caloric intake to maintain  or  achieve ideal body weight, to reduce intake of dietary saturated fat and total fat, to limit sodium intake by avoiding high sodium foods and not adding table salt, and to maintain adequate dietary potassium and calcium preferably from fresh fruits, vegetables, and low-fat dairy products.    stressed the importance of regular exercise  Injury prevention: Discussed safety belts, safety helmets, smoke detector, smoking near bedding or upholstery.   Dental health: Discussed importance of regular tooth brushing, flossing, and dental visits.    NEXT PREVENTATIVE PHYSICAL DUE IN  1 YEAR. No Follow-up on file.

## 2016-01-12 ENCOUNTER — Encounter: Payer: Self-pay | Admitting: Family Medicine

## 2016-01-12 LAB — CBC WITH DIFFERENTIAL/PLATELET
BASOS: 0 %
Basophils Absolute: 0 10*3/uL (ref 0.0–0.2)
EOS (ABSOLUTE): 0.1 10*3/uL (ref 0.0–0.4)
EOS: 1 %
HEMOGLOBIN: 11.9 g/dL (ref 11.1–15.9)
Hematocrit: 35.1 % (ref 34.0–46.6)
IMMATURE GRANS (ABS): 0 10*3/uL (ref 0.0–0.1)
IMMATURE GRANULOCYTES: 0 %
LYMPHS: 17 %
Lymphocytes Absolute: 1.6 10*3/uL (ref 0.7–3.1)
MCH: 31.1 pg (ref 26.6–33.0)
MCHC: 33.9 g/dL (ref 31.5–35.7)
MCV: 92 fL (ref 79–97)
MONOCYTES: 7 %
Monocytes Absolute: 0.7 10*3/uL (ref 0.1–0.9)
NEUTROS ABS: 6.8 10*3/uL (ref 1.4–7.0)
NEUTROS PCT: 75 %
PLATELETS: 333 10*3/uL (ref 150–379)
RBC: 3.83 x10E6/uL (ref 3.77–5.28)
RDW: 13.3 % (ref 12.3–15.4)
WBC: 9.2 10*3/uL (ref 3.4–10.8)

## 2016-01-12 LAB — COMPREHENSIVE METABOLIC PANEL
A/G RATIO: 1.5 (ref 1.2–2.2)
ALBUMIN: 4.3 g/dL (ref 3.6–4.8)
ALT: 19 IU/L (ref 0–32)
AST: 20 IU/L (ref 0–40)
Alkaline Phosphatase: 106 IU/L (ref 39–117)
BUN / CREAT RATIO: 11 — AB (ref 12–28)
BUN: 11 mg/dL (ref 8–27)
Bilirubin Total: 0.3 mg/dL (ref 0.0–1.2)
CALCIUM: 9.6 mg/dL (ref 8.7–10.3)
CO2: 26 mmol/L (ref 18–29)
Chloride: 97 mmol/L (ref 96–106)
Creatinine, Ser: 1 mg/dL (ref 0.57–1.00)
GFR, EST AFRICAN AMERICAN: 67 mL/min/{1.73_m2} (ref >59)
GFR, EST NON AFRICAN AMERICAN: 58 mL/min/{1.73_m2} — AB (ref >59)
Globulin, Total: 2.9 g/dL (ref 1.5–4.5)
Glucose: 135 mg/dL — ABNORMAL HIGH (ref 65–99)
Potassium: 4.6 mmol/L (ref 3.5–5.2)
Sodium: 138 mmol/L (ref 134–144)
TOTAL PROTEIN: 7.2 g/dL (ref 6.0–8.5)

## 2016-01-12 LAB — TSH: TSH: 4.04 u[IU]/mL (ref 0.450–4.500)

## 2016-01-13 ENCOUNTER — Telehealth: Payer: Self-pay | Admitting: Family Medicine

## 2016-01-13 MED ORDER — SUCRALFATE 1 G PO TABS: 1.0000 g | ORAL_TABLET | Freq: Three times a day (TID) | ORAL | 1 refills | Status: DC

## 2016-01-13 NOTE — Telephone Encounter (Signed)
Patient states that this medication is very expensive and that you told her to let you know if that was the case and you could give her something different

## 2016-01-13 NOTE — Telephone Encounter (Signed)
New Rx sent to her pharmacy 

## 2016-01-13 NOTE — Telephone Encounter (Signed)
Called patient and left a message asking her if she knew what medication would be cheaper for her.

## 2016-01-13 NOTE — Telephone Encounter (Signed)
Pt would like to have rx changed from sucralfate (CARAFATE) 1 GM/10ML suspension due to the cost. Pharmacy is [facility] [place].

## 2016-02-15 ENCOUNTER — Ambulatory Visit (INDEPENDENT_AMBULATORY_CARE_PROVIDER_SITE_OTHER): Payer: Medicare Other | Admitting: Family Medicine

## 2016-02-15 ENCOUNTER — Encounter: Payer: Self-pay | Admitting: Family Medicine

## 2016-02-15 VITALS — BP 143/80 | HR 77 | Temp 98.5°F | Wt 168.8 lb

## 2016-02-15 DIAGNOSIS — R1013 Epigastric pain: Secondary | ICD-10-CM

## 2016-02-15 DIAGNOSIS — I10 Essential (primary) hypertension: Secondary | ICD-10-CM

## 2016-02-15 DIAGNOSIS — J449 Chronic obstructive pulmonary disease, unspecified: Secondary | ICD-10-CM

## 2016-02-15 MED ORDER — OMEPRAZOLE 20 MG PO CPDR: 20.0000 mg | DELAYED_RELEASE_CAPSULE | Freq: Every day | ORAL | 0 refills | Status: DC

## 2016-02-15 MED ORDER — LISINOPRIL 20 MG PO TABS: 20.0000 mg | ORAL_TABLET | Freq: Every day | ORAL | 1 refills | Status: DC

## 2016-02-15 NOTE — Assessment & Plan Note (Signed)
Lungs clear today. Call with any concerns. Continue to follow with pulmonology. Will consider sleep study when her puppy is a little older.

## 2016-02-15 NOTE — Assessment & Plan Note (Signed)
Will increase his lisinopril to '20mg'$  and recheck 1 month.

## 2016-02-15 NOTE — Progress Notes (Signed)
BP (!) 143/80 (BP Location: Left Arm, Patient Position: Sitting, Cuff Size: Normal)   Pulse 77   Temp 98.5 F (36.9 C)   Wt 168 lb 12.8 oz (76.6 kg)   SpO2 99%   BMI 26.20 kg/m    Subjective:    Patient ID: Mary Hoover, female    DOB: [birth year], 71 y.o.   MRN: [id number]  HPI: Mary Hoover is a 71 y.o. female  Chief Complaint  Patient presents with  . Hypertension  . COPD  . Abdominal Pain   HYPERTENSION Hypertension status: better  Satisfied with current treatment? yes Duration of hypertension: chronic BP monitoring frequency:  not checking BP medication side effects:  no Medication compliance: excellent compliance Previous BP meds: lisinopril Aspirin: yes Recurrent headaches: no Visual changes: yes Palpitations: no Dyspnea: no Chest pain: no Lower extremity edema: no Dizzy/lightheaded: no  ABDOMINAL PAIN- better than it was, but still hurting Duration: 6 weeks or so Onset: sudden Severity: severe Quality: burning and cramping Location:  LUQ  Episode duration: Pain better, but stays tender  Radiation: no Frequency: intermittent Alleviating factors: zantac, carafate, omeprazole Aggravating factors: nothing Status: fluctuating Treatments attempted: H2 Blocker, carafate, omeprazole Fever: no Nausea: no Vomiting: no Weight loss: no Decreased appetite: no Diarrhea: yes Constipation: no Blood in stool: no Heartburn: no Jaundice: no Rash: no Dysuria/urinary frequency: no Hematuria: no History of sexually transmitted disease: no Recurrent NSAID use: yes- not every day  COPD COPD status: stable Satisfied with current treatment?: yes Oxygen use: no Dyspnea frequency: rarely  Catches her breath when she is sleeping, usually several times a night when she sleeps on her back- never had a sleep study Cough frequency: rarely Rescue inhaler frequency:   Limitation of activity: no Productive cough: no Last Spirometry:  Done  elsewhere Pneumovax: Up to Date Influenza: Up to Date   Relevant past medical, surgical, family and social history reviewed and updated as indicated. Interim medical history since our last visit reviewed. Allergies and medications reviewed and updated.  Review of Systems  Constitutional: Negative.   Respiratory: Negative.   Cardiovascular: Negative.   Gastrointestinal: Positive for abdominal pain. Negative for abdominal distention, anal bleeding, blood in stool, constipation, diarrhea, nausea, rectal pain and vomiting.  Psychiatric/Behavioral: Negative.     Per HPI unless specifically indicated above     Objective:    BP (!) 143/80 (BP Location: Left Arm, Patient Position: Sitting, Cuff Size: Normal)   Pulse 77   Temp 98.5 F (36.9 C)   Wt 168 lb 12.8 oz (76.6 kg)   SpO2 99%   BMI 26.20 kg/m   Wt Readings from Last 3 Encounters:  02/15/16 168 lb 12.8 oz (76.6 kg)  01/11/16 168 lb 3.2 oz (76.3 kg)  07/13/15 147 lb (66.7 kg)    Physical Exam  Constitutional: She is oriented to person, place, and time. She appears well-developed and well-nourished. No distress.  HENT:  Head: Normocephalic and atraumatic.  Right Ear: Hearing normal.  Left Ear: Hearing normal.  Nose: Nose normal.  Eyes: Conjunctivae and lids are normal. Right eye exhibits no discharge. Left eye exhibits no discharge. No scleral icterus.  Cardiovascular: Normal rate, regular rhythm, normal heart sounds and intact distal pulses.  Exam reveals no gallop and no friction rub.   No murmur heard. Pulmonary/Chest: Effort normal and breath sounds normal. No respiratory distress. She has no wheezes. She has no rales. She exhibits no tenderness.  Abdominal: Soft. Bowel sounds are normal. She exhibits  no distension. There is no tenderness. There is no rebound.  Musculoskeletal: Normal range of motion.  Neurological: She is alert and oriented to person, place, and time.  Skin: Skin is warm, dry and intact. No rash  noted. She is not diaphoretic. No erythema. No pallor.  Psychiatric: She has a normal mood and affect. Her speech is normal and behavior is normal. Judgment and thought content normal. Cognition and memory are normal.  Nursing note and vitals reviewed.   Results for orders placed or performed in visit on 01/11/16  Microscopic Examination  Result Value Ref Range   WBC, UA 6-10 (A) 0 - 5 /hpf   RBC, UA 3-10 (A) 0 - 2 /hpf   Epithelial Cells (non renal) 0-10 0 - 10 /hpf   Mucus, UA Present (A) Not Estab.   Bacteria, UA Few (A) None seen/Few  CBC with Differential/Platelet  Result Value Ref Range   WBC 9.2 3.4 - 10.8 x10E3/uL   RBC 3.83 3.77 - 5.28 x10E6/uL   Hemoglobin 11.9 11.1 - 15.9 g/dL   Hematocrit 35.1 34.0 - 46.6 %   MCV 92 79 - 97 fL   MCH 31.1 26.6 - 33.0 pg   MCHC 33.9 31.5 - 35.7 g/dL   RDW 13.3 12.3 - 15.4 %   Platelets 333 150 - 379 x10E3/uL   Neutrophils 75 Not Estab. %   Lymphs 17 Not Estab. %   Monocytes 7 Not Estab. %   Eos 1 Not Estab. %   Basos 0 Not Estab. %   Neutrophils Absolute 6.8 1.4 - 7.0 x10E3/uL   Lymphocytes Absolute 1.6 0.7 - 3.1 x10E3/uL   Monocytes Absolute 0.7 0.1 - 0.9 x10E3/uL   EOS (ABSOLUTE) 0.1 0.0 - 0.4 x10E3/uL   Basophils Absolute 0.0 0.0 - 0.2 x10E3/uL   Immature Granulocytes 0 Not Estab. %   Immature Grans (Abs) 0.0 0.0 - 0.1 x10E3/uL  Comprehensive metabolic panel  Result Value Ref Range   Glucose 135 (H) 65 - 99 mg/dL   BUN 11 8 - 27 mg/dL   Creatinine, Ser 1.00 0.57 - 1.00 mg/dL   GFR calc non Af Amer 58 (L) >59 mL/min/1.73   GFR calc Af Amer 67 >59 mL/min/1.73   BUN/Creatinine Ratio 11 (L) 12 - 28   Sodium 138 134 - 144 mmol/L   Potassium 4.6 3.5 - 5.2 mmol/L   Chloride 97 96 - 106 mmol/L   CO2 26 18 - 29 mmol/L   Calcium 9.6 8.7 - 10.3 mg/dL   Total Protein 7.2 6.0 - 8.5 g/dL   Albumin 4.3 3.6 - 4.8 g/dL   Globulin, Total 2.9 1.5 - 4.5 g/dL   Albumin/Globulin Ratio 1.5 1.2 - 2.2   Bilirubin Total 0.3 0.0 - 1.2 mg/dL    Alkaline Phosphatase 106 39 - 117 IU/L   AST 20 0 - 40 IU/L   ALT 19 0 - 32 IU/L  Microalbumin, Urine Waived  Result Value Ref Range   Microalb, Ur Waived 10 0 - 19 mg/L   Creatinine, Urine Waived 50 10 - 300 mg/dL   Microalb/Creat Ratio 30-300 (H) <30 mg/g  TSH  Result Value Ref Range   TSH 4.040 0.450 - 4.500 uIU/mL  UA/M w/rflx Culture, Routine  Result Value Ref Range   Specific Gravity, UA <1.005 (L) 1.005 - 1.030   pH, UA 5.5 5.0 - 7.5   Color, UA Yellow Yellow   Appearance Ur Clear Clear   Leukocytes, UA 1+ (A) Negative  Protein, UA Negative Negative/Trace   Glucose, UA Negative Negative   Ketones, UA Negative Negative   RBC, UA 1+ (A) Negative   Bilirubin, UA Negative Negative   Urobilinogen, Ur 0.2 0.2 - 1.0 mg/dL   Nitrite, UA Negative Negative   Microscopic Examination See below:   Lipid Panel Piccolo, Waived  Result Value Ref Range   Cholesterol Piccolo, Waived 146 <200 mg/dL   HDL Chol Piccolo, Waived 65 >59 mg/dL   Triglycerides Piccolo,Waived 175 (H) <150 mg/dL   Chol/HDL Ratio Piccolo,Waive 2.3 mg/dL   LDL Chol Calc Piccolo Waived 46 <100 mg/dL   VLDL Chol Calc Piccolo,Waive 35 (H) <30 mg/dL      Assessment & Plan:   Problem List Items Addressed This Visit      Cardiovascular and Mediastinum   Essential hypertension - Primary    Will increase his lisinopril to '20mg'$  and recheck 1 month.       Relevant Medications   lisinopril (PRINIVIL,ZESTRIL) 20 MG tablet     Respiratory   COPD (chronic obstructive pulmonary disease) (HCC)    Lungs clear today. Call with any concerns. Continue to follow with pulmonology. Will consider sleep study when her puppy is a little older.        Other Visit Diagnoses    Epigastric pain       Better, but not resolved. Will give her a little more time on her omeprazole. Recheck 1 month.        Follow up plan: Return in about 4 weeks (around 03/14/2016) for recheck belly pain and BP with BMP.

## 2016-03-07 ENCOUNTER — Encounter: Payer: Self-pay | Admitting: Emergency Medicine

## 2016-03-07 ENCOUNTER — Emergency Department
Admission: EM | Admit: 2016-03-07 | Discharge: 2016-03-07 | Disposition: A | Discharge status: Home / Self Care | Payer: Medicare Other | Attending: Emergency Medicine | Admitting: Emergency Medicine

## 2016-03-07 ENCOUNTER — Emergency Department: Payer: Medicare Other

## 2016-03-07 DIAGNOSIS — R531 Weakness: Secondary | ICD-10-CM | POA: Diagnosis not present

## 2016-03-07 DIAGNOSIS — R42 Dizziness and giddiness: Secondary | ICD-10-CM | POA: Insufficient documentation

## 2016-03-07 DIAGNOSIS — R55 Syncope and collapse: Secondary | ICD-10-CM | POA: Diagnosis not present

## 2016-03-07 DIAGNOSIS — Z87891 Personal history of nicotine dependence: Secondary | ICD-10-CM | POA: Insufficient documentation

## 2016-03-07 DIAGNOSIS — J449 Chronic obstructive pulmonary disease, unspecified: Secondary | ICD-10-CM | POA: Diagnosis not present

## 2016-03-07 DIAGNOSIS — R200 Anesthesia of skin: Secondary | ICD-10-CM | POA: Diagnosis not present

## 2016-03-07 DIAGNOSIS — R0602 Shortness of breath: Secondary | ICD-10-CM | POA: Diagnosis not present

## 2016-03-07 DIAGNOSIS — E039 Hypothyroidism, unspecified: Secondary | ICD-10-CM | POA: Diagnosis not present

## 2016-03-07 DIAGNOSIS — I10 Essential (primary) hypertension: Secondary | ICD-10-CM | POA: Diagnosis not present

## 2016-03-07 DIAGNOSIS — Z7982 Long term (current) use of aspirin: Secondary | ICD-10-CM | POA: Diagnosis not present

## 2016-03-07 DIAGNOSIS — Z79899 Other long term (current) drug therapy: Secondary | ICD-10-CM | POA: Insufficient documentation

## 2016-03-07 LAB — BASIC METABOLIC PANEL
ANION GAP: 10 (ref 5–15)
BUN: 11 mg/dL (ref 6–20)
CALCIUM: 9.3 mg/dL (ref 8.9–10.3)
CO2: 22 mmol/L (ref 22–32)
CREATININE: 1.05 mg/dL — AB (ref 0.44–1.00)
Chloride: 103 mmol/L (ref 101–111)
GFR, EST NON AFRICAN AMERICAN: 53 mL/min — AB (ref >60)
Glucose, Bld: 117 mg/dL — ABNORMAL HIGH (ref 65–99)
Potassium: 3.9 mmol/L (ref 3.5–5.1)
SODIUM: 135 mmol/L (ref 135–145)

## 2016-03-07 LAB — URINALYSIS, COMPLETE (UACMP) WITH MICROSCOPIC
BILIRUBIN URINE: NEGATIVE
Bacteria, UA: NONE SEEN
GLUCOSE, UA: NEGATIVE mg/dL
Ketones, ur: NEGATIVE mg/dL
NITRITE: NEGATIVE
PROTEIN: NEGATIVE mg/dL
Specific Gravity, Urine: 1.006 (ref 1.005–1.030)
pH: 6 (ref 5.0–8.0)

## 2016-03-07 LAB — CBC
HCT: 33.2 % — ABNORMAL LOW (ref 35.0–47.0)
HEMOGLOBIN: 11.5 g/dL — AB (ref 12.0–16.0)
MCH: 31.5 pg (ref 26.0–34.0)
MCHC: 34.6 g/dL (ref 32.0–36.0)
MCV: 90.8 fL (ref 80.0–100.0)
PLATELETS: 304 10*3/uL (ref 150–440)
RBC: 3.66 MIL/uL — AB (ref 3.80–5.20)
RDW: 13.3 % (ref 11.5–14.5)
WBC: 12 10*3/uL — AB (ref 3.6–11.0)

## 2016-03-07 LAB — TROPONIN I: Troponin I: <0.03 ng/mL (ref <0.03)

## 2016-03-07 LAB — BLOOD GAS, VENOUS
PO2 VEN: 34 mmHg (ref 32.0–45.0)
pCO2, Ven: 35 mmHg — ABNORMAL LOW (ref 44.0–60.0)
pH, Ven: 7.43 (ref 7.250–7.430)

## 2016-03-07 MED ORDER — SODIUM CHLORIDE 0.9 % IV SOLN
Freq: Once | INTRAVENOUS | Status: AC
  Administered 2016-03-07: 13:00:00 via INTRAVENOUS

## 2016-03-07 MED ORDER — ONDANSETRON HCL 4 MG/2ML IJ SOLN
4.0000 mg | Freq: Once | INTRAMUSCULAR | Status: AC
  Administered 2016-03-07: 4 mg via INTRAVENOUS
  Filled 2016-03-07: qty 2

## 2016-03-07 MED ORDER — MECLIZINE HCL 25 MG PO TABS: 25.0000 mg | ORAL_TABLET | Freq: Three times a day (TID) | ORAL | 1 refills | Status: DC | PRN

## 2016-03-07 NOTE — ED Triage Notes (Signed)
C/o weakness, dizziness, and shaking when tried to get out of bed today. Denies pain or fevers. Has had nausea and some vomiting.  C/o room spinning and lightheaded.  NAD. Skin warm and pink.

## 2016-03-07 NOTE — ED Provider Notes (Signed)
[facility] Emergency Department Provider Note        Time seen: ----------------------------------------- 12:55 PM on 03/07/2016 -----------------------------------------    I have reviewed the triage vital signs and the nursing notes.   HISTORY  Chief Complaint Weakness    HPI Mary Hoover is a 71 y.o. female who presents to the ER for weakness, dizziness and shaking when she tried to get out of bed today. She denies pain or fevers. She says nausea and vomiting. She complains of dizziness that she describes means lightheadedness. She states this is happened to her occasionally in the past, nothing makes it better or worse. Currently her nausea is improved and she is feeling some better. She denies other complaints at this time.   Past Medical History:  Diagnosis Date  . Carotid atherosclerosis   . Carotid bruit   . COPD (chronic obstructive pulmonary disease) (HCC)    emphysema  . Hyperlipidemia   . Hypertension   . Hypothyroidism   . Infected cat bite 1980s   was hospitalized  . Tobacco use     Patient Active Problem List   Diagnosis Date Noted  . Myopia of both eyes 01/11/2016  . Lung nodules   . Essential hypertension 12/12/2014  . Hemoptysis 12/11/2014  . Medication monitoring encounter 11/25/2014  . Hyperlipidemia   . Status post carotid endarterectomy   . Hypothyroidism   . Tobacco use   . COPD (chronic obstructive pulmonary disease) ([place])     Past Surgical History:  Procedure Laterality Date  . CAROTID ENDARTERECTOMY Right Oct. 2015   Dr. Lucky Cowboy  . CAROTID STENOSIS Left April 2016   carotid stenosis surgery  . FLEXIBLE BRONCHOSCOPY N/A 12/17/2014   Procedure: FLEXIBLE BRONCHOSCOPY;  Surgeon: Wilhelmina Mcardle, MD;  Location: [facility] ORS;  Service: Pulmonary;  Laterality: N/A;  . INCISION AND DRAINAGE / EXCISION THYROGLOSSAL CYST  April 2011    Allergies Patient has no known allergies.  Social History Social History   Substance Use Topics  . Smoking status: Former Smoker    Packs/day: 0.50    Years: 50.00    Types: Cigarettes    Quit date: 05/13/2015  . Smokeless tobacco: Never Used  . Alcohol use No     Comment: occasional    Review of Systems Constitutional: Negative for fever. Cardiovascular: Negative for chest pain. Respiratory: Positive for occasional shortness of breath Gastrointestinal: Negative for abdominal pain, positive for recent vomiting Genitourinary: Negative for dysuria. Musculoskeletal: Negative for back pain. Skin: Negative for rash. Neurological: Negative for headaches, positive for dizziness, weakness  10-point ROS otherwise negative.  ____________________________________________   PHYSICAL EXAM:  VITAL SIGNS: ED Triage Vitals  Enc Vitals Group     BP 03/07/16 0930 (!) 144/50     Pulse Rate 03/07/16 0930 71     Resp 03/07/16 0930 (!) 25     Temp 03/07/16 0930 98.2 F (36.8 C)     Temp Source 03/07/16 0930 Oral     SpO2 03/07/16 0930 99 %     Weight 03/07/16 0929 160 lb (72.6 kg)     Height 03/07/16 0929 _0  (1.727 m)     Head Circumference --      Peak Flow --      Pain Score --      Pain Loc --      Pain Edu? --      Excl. in [place]? --     Constitutional: Alert and oriented. Well appearing and in  no distress. Eyes: Conjunctivae are normal. PERRL. Normal extraocular movements. ENT   Head: Normocephalic and atraumatic.   Nose: No congestion/rhinnorhea.   Mouth/Throat: Mucous membranes are moist.   Neck: No stridor. Cardiovascular: Normal rate, regular rhythm. No murmurs, rubs, or gallops. Respiratory: Normal respiratory effort without tachypnea nor retractions. Breath sounds are clear and equal bilaterally. No wheezes/rales/rhonchi. Gastrointestinal: Soft and nontender. Normal bowel sounds Musculoskeletal: Nontender with normal range of motion in all extremities. No lower extremity tenderness nor edema. Neurologic:  Normal speech and  language. No gross focal neurologic deficits are appreciated.  Skin:  Skin is warm, dry and intact. No rash noted. Psychiatric: Mood and affect are normal. Speech and behavior are normal.  ____________________________________________  EKG: Interpreted by me.Sinus rhythm rate of 72 bpm, normal PR interval, normal QRS, normal QT.  ____________________________________________  ED COURSE:  Pertinent labs & imaging results that were available during my care of the patient were reviewed by me and considered in my medical decision making (see chart for details). Patient presents to ER with a complaints of weakness with recent vomiting. We will assess with labs and imaging.   Procedures ____________________________________________   LABS (pertinent positives/negatives)  Labs Reviewed  BASIC METABOLIC PANEL - Abnormal; Notable for the following:       Result Value   Glucose, Bld 117 (*)    Creatinine, Ser 1.05 (*)    GFR calc non Af Amer 53 (*)    All other components within normal limits  CBC - Abnormal; Notable for the following:    WBC 12.0 (*)    RBC 3.66 (*)    Hemoglobin 11.5 (*)    HCT 33.2 (*)    All other components within normal limits  URINALYSIS, COMPLETE (UACMP) WITH MICROSCOPIC - Abnormal; Notable for the following:    Color, Urine STRAW (*)    APPearance CLEAR (*)    Hgb urine dipstick MODERATE (*)    Leukocytes, UA TRACE (*)    Squamous Epithelial / LPF 0-5 (*)    All other components within normal limits  BLOOD GAS, VENOUS - Abnormal; Notable for the following:    pCO2, Ven 35 (*)    All other components within normal limits  TROPONIN I  CBG MONITORING, ED    RADIOLOGY Images were viewed by me  Chest x-ray IMPRESSION: COPD. No pneumonia, CHF, nor other acute cardiopulmonary abnormality.  Thoracic aortic atherosclerosis. ____________________________________________  FINAL ASSESSMENT AND PLAN  Weakness  Plan: Patient with labs and imaging as  dictated above. Patient is in no acute distress, no clear explanation for her symptoms. Symptoms seem to have resolved at this time, she received fluids and Zofran here. She is stable for outpatient follow-up.   Earleen Newport, MD   Note: This note was generated in part or whole with voice recognition software. Voice recognition is usually quite accurate but there are transcription errors that can and very often do occur. I apologize for any typographical errors that were not detected and corrected.     Earleen Newport, MD 03/07/16 [phone]

## 2016-03-07 NOTE — ED Notes (Signed)
Attempted to get urine, patient unable to void at this time.  Taken to subwaiting room.

## 2016-03-07 NOTE — ED Notes (Signed)
Patient had steady gait to room commode.

## 2016-03-23 ENCOUNTER — Ambulatory Visit: Payer: Medicare Other | Admitting: Family Medicine

## 2016-03-28 ENCOUNTER — Ambulatory Visit (INDEPENDENT_AMBULATORY_CARE_PROVIDER_SITE_OTHER): Payer: Medicare Other | Admitting: Family Medicine

## 2016-03-28 ENCOUNTER — Encounter: Payer: Self-pay | Admitting: Family Medicine

## 2016-03-28 VITALS — BP 130/76 | HR 89 | Temp 98.7°F | Resp 17 | Ht 68.0 in | Wt 168.0 lb

## 2016-03-28 DIAGNOSIS — R1013 Epigastric pain: Secondary | ICD-10-CM | POA: Diagnosis not present

## 2016-03-28 DIAGNOSIS — I10 Essential (primary) hypertension: Secondary | ICD-10-CM | POA: Diagnosis not present

## 2016-03-28 MED ORDER — LISINOPRIL 20 MG PO TABS: 20.0000 mg | ORAL_TABLET | Freq: Every day | ORAL | 1 refills | Status: DC

## 2016-03-28 NOTE — Progress Notes (Signed)
BP 130/76 (BP Location: Left Arm, Patient Position: Sitting, Cuff Size: Normal)   Pulse 89   Temp 98.7 F (37.1 C) (Oral)   Resp 17   Ht '5\' 8"'$  (1.727 m)   Wt 168 lb (76.2 kg)   SpO2 100%   BMI 25.54 kg/m    Subjective:    Patient ID: Mary Hoover, female    DOB: [birth year], 71 y.o.   MRN: [id number]  HPI: Mary Hoover is a 71 y.o. female  Chief Complaint  Patient presents with  . Hypertension    Follow up HPN and belly pain   HYPERTENSION Hypertension status: better  Satisfied with current treatment? yes Duration of hypertension: chronic BP monitoring frequency:  not checking BP medication side effects:  no Medication compliance: excellent compliance Previous BP meds: lisinopril Aspirin: yes Recurrent headaches: no Visual changes: no Palpitations: no Dyspnea: no Chest pain: no Lower extremity edema: no Dizzy/lightheaded: no  ABDOMINAL PAIN  Duration: 10 weeks or so Onset: sudden Severity: moderate Quality: light Location: LUQ Episode duration: Pain better, but stays tender- continues to improve  Radiation: no Frequency: intermittent Alleviating factors: zantac, carafate, omeprazole,  Aggravating factors:rolling over in bed and staying bent over Status: fluctuating Treatments attempted: H2 Blocker, carafate, omeprazole Fever: no Nausea: no Vomiting: no Weight loss: no Decreased appetite: no Diarrhea: yes Constipation: no Blood in stool:no Heartburn: no Jaundice: no Rash: no Dysuria/urinary frequency: no Hematuria: no History of sexually transmitted disease: no Recurrent NSAID use: yes- not every day   Relevant past medical, surgical, family and social history reviewed and updated as indicated. Interim medical history since our last visit reviewed. Allergies and medications reviewed and updated.  Review of Systems  Constitutional: Negative.   Respiratory: Negative.   Cardiovascular: Negative.   Psychiatric/Behavioral:  Negative.     Per HPI unless specifically indicated above     Objective:    BP 130/76 (BP Location: Left Arm, Patient Position: Sitting, Cuff Size: Normal)   Pulse 89   Temp 98.7 F (37.1 C) (Oral)   Resp 17   Ht '5\' 8"'$  (1.727 m)   Wt 168 lb (76.2 kg)   SpO2 100%   BMI 25.54 kg/m   Wt Readings from Last 3 Encounters:  03/28/16 168 lb (76.2 kg)  03/07/16 160 lb (72.6 kg)  02/15/16 168 lb 12.8 oz (76.6 kg)    Physical Exam  Constitutional: She is oriented to person, place, and time. She appears well-developed and well-nourished. No distress.  HENT:  Head: Normocephalic and atraumatic.  Right Ear: Hearing normal.  Left Ear: Hearing normal.  Nose: Nose normal.  Eyes: Conjunctivae and lids are normal. Right eye exhibits no discharge. Left eye exhibits no discharge. No scleral icterus.  Cardiovascular: Normal rate, regular rhythm, normal heart sounds and intact distal pulses.  Exam reveals no gallop and no friction rub.   No murmur heard. Pulmonary/Chest: Effort normal and breath sounds normal. No respiratory distress. She has no wheezes. She has no rales. She exhibits no tenderness.  Abdominal: Soft. Bowel sounds are normal. She exhibits no distension and no mass. There is no tenderness. There is no rebound and no guarding.  Musculoskeletal: Normal range of motion.  Neurological: She is alert and oriented to person, place, and time.  Skin: Skin is intact. No rash noted.  Psychiatric: She has a normal mood and affect. Her speech is normal and behavior is normal. Judgment and thought content normal. Cognition and memory are normal.  Nursing note and  vitals reviewed.   Results for orders placed or performed during the hospital encounter of 84/66/59  Basic metabolic panel  Result Value Ref Range   Sodium 135 135 - 145 mmol/L   Potassium 3.9 3.5 - 5.1 mmol/L   Chloride 103 101 - 111 mmol/L   CO2 22 22 - 32 mmol/L   Glucose, Bld 117 (H) 65 - 99 mg/dL   BUN 11 6 - 20 mg/dL    Creatinine, Ser 1.05 (H) 0.44 - 1.00 mg/dL   Calcium 9.3 8.9 - 10.3 mg/dL   GFR calc non Af Amer 53 (L) >60 mL/min   GFR calc Af Amer >60 >60 mL/min   Anion gap 10 5 - 15  CBC  Result Value Ref Range   WBC 12.0 (H) 3.6 - 11.0 K/uL   RBC 3.66 (L) 3.80 - 5.20 MIL/uL   Hemoglobin 11.5 (L) 12.0 - 16.0 g/dL   HCT 33.2 (L) 35.0 - 47.0 %   MCV 90.8 80.0 - 100.0 fL   MCH 31.5 26.0 - 34.0 pg   MCHC 34.6 32.0 - 36.0 g/dL   RDW 13.3 11.5 - 14.5 %   Platelets 304 150 - 440 K/uL  Urinalysis, Complete w Microscopic  Result Value Ref Range   Color, Urine STRAW (A) YELLOW   APPearance CLEAR (A) CLEAR   Specific Gravity, Urine 1.006 1.005 - 1.030   pH 6.0 5.0 - 8.0   Glucose, UA NEGATIVE NEGATIVE mg/dL   Hgb urine dipstick MODERATE (A) NEGATIVE   Bilirubin Urine NEGATIVE NEGATIVE   Ketones, ur NEGATIVE NEGATIVE mg/dL   Protein, ur NEGATIVE NEGATIVE mg/dL   Nitrite NEGATIVE NEGATIVE   Leukocytes, UA TRACE (A) NEGATIVE   RBC / HPF 0-5 0 - 5 RBC/hpf   WBC, UA 0-5 0 - 5 WBC/hpf   Bacteria, UA NONE SEEN NONE SEEN   Squamous Epithelial / LPF 0-5 (A) NONE SEEN  Troponin I  Result Value Ref Range   Troponin I <0.03 <0.03 ng/mL  Blood gas, venous  Result Value Ref Range   pH, Ven 7.43 7.250 - 7.430   pCO2, Ven 35 (L) 44.0 - 60.0 mmHg   pO2, Ven 34.0 32.0 - 45.0 mmHg   Collection site VENOUS    Sample type VENOUS       Assessment & Plan:   Problem List Items Addressed This Visit      Cardiovascular and Mediastinum   Essential hypertension - Primary    Under good control. Continue current regimen. Continue to monitor.       Relevant Medications   lisinopril (PRINIVIL,ZESTRIL) 20 MG tablet   Other Relevant Orders   Basic metabolic panel    Other Visit Diagnoses    Epigastric pain       Improved. Continue current regimen. Continue to monitor. Call with any concerns.        Follow up plan: Return in about 6 months (around 09/25/2016) for Follow up HTN/HLD.

## 2016-03-28 NOTE — Assessment & Plan Note (Signed)
Under good control. Continue current regimen. Continue to monitor.  

## 2016-03-29 ENCOUNTER — Encounter: Payer: Self-pay | Admitting: Family Medicine

## 2016-03-29 LAB — BASIC METABOLIC PANEL
BUN/Creatinine Ratio: 8 — ABNORMAL LOW (ref 12–28)
BUN: 8 mg/dL (ref 8–27)
CO2: 22 mmol/L (ref 18–29)
CREATININE: 1.06 mg/dL — AB (ref 0.57–1.00)
Calcium: 9.4 mg/dL (ref 8.7–10.3)
Chloride: 100 mmol/L (ref 96–106)
GFR calc Af Amer: 62 mL/min/{1.73_m2} (ref >59)
GFR calc non Af Amer: 54 mL/min/{1.73_m2} — ABNORMAL LOW (ref >59)
GLUCOSE: 116 mg/dL — AB (ref 65–99)
Potassium: 4 mmol/L (ref 3.5–5.2)
Sodium: 137 mmol/L (ref 134–144)

## 2016-05-10 ENCOUNTER — Other Ambulatory Visit: Payer: Self-pay | Admitting: Family Medicine

## 2016-05-22 ENCOUNTER — Other Ambulatory Visit (INDEPENDENT_AMBULATORY_CARE_PROVIDER_SITE_OTHER): Payer: Self-pay | Admitting: Vascular Surgery

## 2016-05-31 ENCOUNTER — Other Ambulatory Visit: Payer: Self-pay | Admitting: Family Medicine

## 2016-07-28 ENCOUNTER — Other Ambulatory Visit: Payer: Self-pay | Admitting: Family Medicine

## 2016-09-14 ENCOUNTER — Other Ambulatory Visit (INDEPENDENT_AMBULATORY_CARE_PROVIDER_SITE_OTHER): Payer: Self-pay | Admitting: Vascular Surgery

## 2016-09-14 DIAGNOSIS — I739 Peripheral vascular disease, unspecified: Principal | ICD-10-CM

## 2016-09-14 DIAGNOSIS — I779 Disorder of arteries and arterioles, unspecified: Secondary | ICD-10-CM

## 2016-09-15 ENCOUNTER — Other Ambulatory Visit: Payer: Self-pay | Admitting: Family Medicine

## 2016-09-15 ENCOUNTER — Ambulatory Visit (INDEPENDENT_AMBULATORY_CARE_PROVIDER_SITE_OTHER): Payer: Medicare Other | Admitting: Vascular Surgery

## 2016-09-15 ENCOUNTER — Encounter (INDEPENDENT_AMBULATORY_CARE_PROVIDER_SITE_OTHER): Payer: Medicare Other

## 2016-09-26 ENCOUNTER — Encounter: Payer: Self-pay | Admitting: Family Medicine

## 2016-09-26 ENCOUNTER — Ambulatory Visit (INDEPENDENT_AMBULATORY_CARE_PROVIDER_SITE_OTHER): Payer: Medicare Other | Admitting: Family Medicine

## 2016-09-26 VITALS — BP 134/72 | HR 92 | Temp 98.6°F | Ht 67.5 in | Wt 153.6 lb

## 2016-09-26 DIAGNOSIS — Z9889 Other specified postprocedural states: Secondary | ICD-10-CM

## 2016-09-26 DIAGNOSIS — I10 Essential (primary) hypertension: Secondary | ICD-10-CM

## 2016-09-26 DIAGNOSIS — E782 Mixed hyperlipidemia: Secondary | ICD-10-CM

## 2016-09-26 DIAGNOSIS — J449 Chronic obstructive pulmonary disease, unspecified: Secondary | ICD-10-CM

## 2016-09-26 DIAGNOSIS — R42 Dizziness and giddiness: Secondary | ICD-10-CM

## 2016-09-26 DIAGNOSIS — E034 Atrophy of thyroid (acquired): Secondary | ICD-10-CM | POA: Diagnosis not present

## 2016-09-26 LAB — MICROALBUMIN, URINE WAIVED
CREATININE, URINE WAIVED: 50 mg/dL (ref 10–300)
Microalb, Ur Waived: 10 mg/L (ref 0–19)
Microalb/Creat Ratio: <30 mg/g (ref <30)

## 2016-09-26 LAB — UA/M W/RFLX CULTURE, ROUTINE
Bilirubin, UA: NEGATIVE
Glucose, UA: NEGATIVE
Ketones, UA: NEGATIVE
NITRITE UA: NEGATIVE
PH UA: 5.5 (ref 5.0–7.5)
Protein, UA: NEGATIVE
Specific Gravity, UA: <1.005 — ABNORMAL LOW (ref 1.005–1.030)
UUROB: 0.2 mg/dL (ref 0.2–1.0)

## 2016-09-26 LAB — MICROSCOPIC EXAMINATION: BACTERIA UA: NONE SEEN

## 2016-09-26 MED ORDER — ATORVASTATIN CALCIUM 20 MG PO TABS: 20.0000 mg | ORAL_TABLET | Freq: Every day | ORAL | 1 refills | Status: DC

## 2016-09-26 MED ORDER — MECLIZINE HCL 25 MG PO TABS: 25.0000 mg | ORAL_TABLET | Freq: Three times a day (TID) | ORAL | 1 refills | Status: DC | PRN

## 2016-09-26 MED ORDER — LISINOPRIL 20 MG PO TABS: 20.0000 mg | ORAL_TABLET | Freq: Every day | ORAL | 1 refills | Status: DC

## 2016-09-26 MED ORDER — BUDESONIDE-FORMOTEROL FUMARATE 160-4.5 MCG/ACT IN AERO: 2.0000 | INHALATION_SPRAY | Freq: Two times a day (BID) | RESPIRATORY_TRACT | 4 refills | Status: DC

## 2016-09-26 NOTE — Assessment & Plan Note (Signed)
Under good control. Continue current regimen. Continue to monitor. Call with any concerns. 

## 2016-09-26 NOTE — Assessment & Plan Note (Signed)
Rechecking levels today. Call with any concerns.

## 2016-09-26 NOTE — Assessment & Plan Note (Signed)
Due for follow up with Dr. Lucky Cowboy- appointment set up for her.

## 2016-09-26 NOTE — Progress Notes (Signed)
BP 134/72   Pulse 92   Temp 98.6 F (37 C)   Ht 5' 7.5" (1.715 m)   Wt 153 lb 9.6 oz (69.7 kg)   SpO2 98%   BMI 23.70 kg/m    Subjective:    Patient ID: Mary Hoover, female    DOB: 31-May-1945, 72 y.o.   MRN: [id number]  HPI: Mary Hoover is a 71 y.o. female  Chief Complaint  Patient presents with  . Hyperlipidemia  . Hypertension  . Hypothyroidism  . Medication Refill    pt states she would like a refill on meclizine, states she has been feeling swimmy headed and off balance    HYPERTENSION / HYPERLIPIDEMIA Satisfied with current treatment? yes Duration of hypertension: chronic BP monitoring frequency: not checking BP medication side effects: no Duration of hyperlipidemia: chronic Cholesterol medication side effects: no Cholesterol supplements: none Past cholesterol medications: atorvastatin Medication compliance: excellent compliance Aspirin: yes Recent stressors: no Recurrent headaches: no Visual changes: no Palpitations: no Dyspnea: no Chest pain: no Lower extremity edema: no Dizzy/lightheaded: yes  HYPOTHYROIDISM Thyroid control status:controlled Satisfied with current treatment? yes Medication side effects: no Medication compliance: excellent compliance Recent dose adjustment:no Fatigue: no Cold intolerance: no Heat intolerance: no Weight gain: no Weight loss: no Constipation: no Diarrhea/loose stools: no Palpitations: no Lower extremity edema: no Anxiety/depressed mood: no  DIZZINESS Duration: for the past couple of months- at least 1x every month or so Description of symptoms: swimmy headed and nauseous Duration of episode: hours Dizziness frequency: recurrent Provoking factors: moving her head Aggravating factors:  moving her head Triggered by rolling over in bed: no Triggered by bending over: no Aggravated by head movement: yes Aggravated by exertion, coughing, loud noises: no Recent head injury: no Recent or current  viral symptoms: no History of vasovagal episodes: no Nausea: yes Vomiting: no Tinnitus: yes Hearing loss: yes Aural fullness: yes Headache: no Photophobia/phonophobia: no Unsteady gait: yes Postural instability: no Diplopia, dysarthria, dysphagia or weakness: no Related to exertion: no Pallor: no Diaphoresis: no Dyspnea: no Chest pain: no  Relevant past medical, surgical, family and social history reviewed and updated as indicated. Interim medical history since our last visit reviewed. Allergies and medications reviewed and updated.  Review of Systems  Constitutional: Negative.   Respiratory: Negative.   Cardiovascular: Negative.   Neurological: Positive for dizziness. Negative for tremors, seizures, syncope, facial asymmetry, speech difficulty, weakness, light-headedness, numbness and headaches.  Psychiatric/Behavioral: Negative for agitation, behavioral problems, confusion, decreased concentration, dysphoric mood, hallucinations, self-injury, sleep disturbance and suicidal ideas. The patient is not nervous/anxious and is not hyperactive.     Per HPI unless specifically indicated above     Objective:    BP 134/72   Pulse 92   Temp 98.6 F (37 C)   Ht 5' 7.5" (1.715 m)   Wt 153 lb 9.6 oz (69.7 kg)   SpO2 98%   BMI 23.70 kg/m   Wt Readings from Last 3 Encounters:  09/26/16 153 lb 9.6 oz (69.7 kg)  03/28/16 168 lb (76.2 kg)  03/07/16 160 lb (72.6 kg)    Physical Exam  Constitutional: She is oriented to person, place, and time. She appears well-developed and well-nourished. No distress.  HENT:  Head: Normocephalic and atraumatic.  Right Ear: Hearing and external ear normal.  Left Ear: Hearing and external ear normal.  Nose: Nose normal.  Mouth/Throat: Oropharynx is clear and moist. No oropharyngeal exudate.  Eyes: Pupils are equal, round, and reactive to light. Conjunctivae,  EOM and lids are normal. Right eye exhibits no discharge. Left eye exhibits no discharge.  No scleral icterus. Right eye exhibits normal extraocular motion and no nystagmus. Left eye exhibits normal extraocular motion and no nystagmus. Right pupil is round and reactive. Left pupil is round and reactive. Pupils are equal.  Neck:  Carotid bruit on the L  Cardiovascular: Normal rate, regular rhythm, normal heart sounds and intact distal pulses.  Exam reveals no gallop and no friction rub.   No murmur heard. Pulmonary/Chest: Effort normal and breath sounds normal. No respiratory distress. She has no wheezes. She has no rales. She exhibits no tenderness.  Musculoskeletal: Normal range of motion.  Neurological: She is alert and oriented to person, place, and time.  Skin: Skin is warm, dry and intact. No rash noted. She is not diaphoretic. No erythema. No pallor.  Psychiatric: She has a normal mood and affect. Her speech is normal and behavior is normal. Judgment and thought content normal. Cognition and memory are normal.  Nursing note and vitals reviewed.   Results for orders placed or performed in visit on 09/26/16  Microscopic Examination  Result Value Ref Range   WBC, UA 0-5 0 - 5 /hpf   RBC, UA 0-2 0 - 2 /hpf   Epithelial Cells (non renal) 0-10 0 - 10 /hpf   Bacteria, UA None seen None seen/Few  Microalbumin, Urine Waived  Result Value Ref Range   Microalb, Ur Waived 10 0 - 19 mg/L   Creatinine, Urine Waived 50 10 - 300 mg/dL   Microalb/Creat Ratio <30 <30 mg/g  UA/M w/rflx Culture, Routine  Result Value Ref Range   Specific Gravity, UA <1.005 (L) 1.005 - 1.030   pH, UA 5.5 5.0 - 7.5   Color, UA Yellow Yellow   Appearance Ur Clear Clear   Leukocytes, UA Trace (A) Negative   Protein, UA Negative Negative/Trace   Glucose, UA Negative Negative   Ketones, UA Negative Negative   RBC, UA 2+ (A) Negative   Bilirubin, UA Negative Negative   Urobilinogen, Ur 0.2 0.2 - 1.0 mg/dL   Nitrite, UA Negative Negative   Microscopic Examination See below:       Assessment & Plan:    Problem List Items Addressed This Visit      Cardiovascular and Mediastinum   Essential hypertension    Under good control. Continue current regimen. Continue to monitor. Call with any concerns.       Relevant Medications   lisinopril (PRINIVIL,ZESTRIL) 20 MG tablet   atorvastatin (LIPITOR) 20 MG tablet   Other Relevant Orders   CBC with Differential/Platelet   Comprehensive metabolic panel   Microalbumin, Urine Waived (Completed)   UA/M w/rflx Culture, Routine (Completed)     Respiratory   COPD (chronic obstructive pulmonary disease) (HCC)    Under good control. Continue current regimen. Continue to monitor. Call with any concerns.       Relevant Medications   budesonide-formoterol (SYMBICORT) 160-4.5 MCG/ACT inhaler     Endocrine   Hypothyroidism - Primary    Rechecking levels today. Call with any concerns.       Relevant Orders   CBC with Differential/Platelet   Comprehensive metabolic panel   TSH   UA/M w/rflx Culture, Routine (Completed)     Other   Hyperlipidemia    Under good control. Continue current regimen. Continue to monitor. Call with any concerns.       Relevant Medications   lisinopril (PRINIVIL,ZESTRIL) 20 MG tablet  atorvastatin (LIPITOR) 20 MG tablet   Other Relevant Orders   CBC with Differential/Platelet   Comprehensive metabolic panel   Lipid Panel w/o Chol/HDL Ratio   UA/M w/rflx Culture, Routine (Completed)   Status post carotid endarterectomy    Due for follow up with Dr. Lucky Cowboy- appointment set up for her.        Other Visit Diagnoses    Dizziness       No nystagmus. Refill of meclizine given today. Will refer to ENT. Await their input.    Relevant Orders   CBC with Differential/Platelet   TSH   UA/M w/rflx Culture, Routine (Completed)   Ambulatory referral to ENT       Follow up plan: Return 4-6 weeks, for follow up dizziness.

## 2016-09-27 ENCOUNTER — Telehealth: Payer: Self-pay | Admitting: Family Medicine

## 2016-09-27 DIAGNOSIS — D649 Anemia, unspecified: Secondary | ICD-10-CM

## 2016-09-27 DIAGNOSIS — Z1211 Encounter for screening for malignant neoplasm of colon: Secondary | ICD-10-CM

## 2016-09-27 LAB — CBC WITH DIFFERENTIAL/PLATELET
Basophils Absolute: 0 10*3/uL (ref 0.0–0.2)
Basos: 0 %
EOS (ABSOLUTE): 0.1 10*3/uL (ref 0.0–0.4)
EOS: 1 %
HEMATOCRIT: 30.8 % — AB (ref 34.0–46.6)
HEMOGLOBIN: 10.3 g/dL — AB (ref 11.1–15.9)
IMMATURE GRANULOCYTES: 0 %
Immature Grans (Abs): 0 10*3/uL (ref 0.0–0.1)
LYMPHS ABS: 1.4 10*3/uL (ref 0.7–3.1)
Lymphs: 15 %
MCH: 30.1 pg (ref 26.6–33.0)
MCHC: 33.4 g/dL (ref 31.5–35.7)
MCV: 90 fL (ref 79–97)
MONOCYTES: 7 %
Monocytes Absolute: 0.7 10*3/uL (ref 0.1–0.9)
NEUTROS PCT: 77 %
Neutrophils Absolute: 7.2 10*3/uL — ABNORMAL HIGH (ref 1.4–7.0)
Platelets: 310 10*3/uL (ref 150–379)
RBC: 3.42 x10E6/uL — AB (ref 3.77–5.28)
RDW: 13.7 % (ref 12.3–15.4)
WBC: 9.4 10*3/uL (ref 3.4–10.8)

## 2016-09-27 LAB — COMPREHENSIVE METABOLIC PANEL
ALBUMIN: 4.2 g/dL (ref 3.6–4.8)
ALT: 13 IU/L (ref 0–32)
AST: 16 IU/L (ref 0–40)
Albumin/Globulin Ratio: 1.7 (ref 1.2–2.2)
Alkaline Phosphatase: 85 IU/L (ref 39–117)
BUN / CREAT RATIO: 15 (ref 12–28)
BUN: 15 mg/dL (ref 8–27)
Bilirubin Total: 0.2 mg/dL (ref 0.0–1.2)
CALCIUM: 9.5 mg/dL (ref 8.7–10.3)
CO2: 19 mmol/L — AB (ref 20–29)
CREATININE: 1 mg/dL (ref 0.57–1.00)
Chloride: 101 mmol/L (ref 96–106)
GFR, EST AFRICAN AMERICAN: 66 mL/min/{1.73_m2} (ref >59)
GFR, EST NON AFRICAN AMERICAN: 58 mL/min/{1.73_m2} — AB (ref >59)
GLOBULIN, TOTAL: 2.5 g/dL (ref 1.5–4.5)
Glucose: 90 mg/dL (ref 65–99)
Potassium: 4.6 mmol/L (ref 3.5–5.2)
Sodium: 137 mmol/L (ref 134–144)
TOTAL PROTEIN: 6.7 g/dL (ref 6.0–8.5)

## 2016-09-27 LAB — LIPID PANEL W/O CHOL/HDL RATIO
Cholesterol, Total: 128 mg/dL (ref 100–199)
HDL: 53 mg/dL (ref >39)
LDL CALC: 49 mg/dL (ref 0–99)
TRIGLYCERIDES: 130 mg/dL (ref 0–149)
VLDL Cholesterol Cal: 26 mg/dL (ref 5–40)

## 2016-09-27 LAB — TSH: TSH: 1.79 u[IU]/mL (ref 0.450–4.500)

## 2016-09-27 MED ORDER — FERROUS SULFATE 325 (65 FE) MG PO TBEC: 325.0000 mg | DELAYED_RELEASE_TABLET | Freq: Three times a day (TID) | ORAL | 3 refills | Status: DC

## 2016-09-27 MED ORDER — LEVOTHYROXINE SODIUM 75 MCG PO TABS: 75.0000 ug | ORAL_TABLET | Freq: Every day | ORAL | 3 refills | Status: DC

## 2016-09-27 NOTE — Telephone Encounter (Signed)
Called patient x2 to give her her results. Line busy. Please let her know the following:  Her labs look good, except she is anemic and a bit worse than last time we checked her. I'm going to start her on some iron to help make her feel a little better, but I would like her to have a colonoscopy so we can find out where this blood is going.  Iron and refill of thyroid medicine at her pharmacy.

## 2016-09-28 NOTE — Telephone Encounter (Signed)
Patient notified

## 2016-10-02 NOTE — Progress Notes (Deleted)
She has been referred for a colonoscopy in view of anemia. 2+ RBC in urine. Hb 10.3 grams which  Was 11.9 grams 8 months back , MCV 90 .  Last colonoscopy was  Rectal bleeding: *** Nose bleeds: *** Vaginal bleeding : *** Hematemesis or hemoptysis : *** Blood in urine : ***   .  Plan  1. Check iron studies , b12,folate ,celiac serology  2. She has had RBC's in her urine on repeated exams  3. EGD+colonoscopy and if she has iron deficiency anemia may even need a capsule study if endoscopy is inconclusive.

## 2016-10-03 ENCOUNTER — Ambulatory Visit: Payer: Medicare Other | Admitting: Gastroenterology

## 2016-10-10 DIAGNOSIS — R42 Dizziness and giddiness: Secondary | ICD-10-CM | POA: Diagnosis not present

## 2016-10-24 ENCOUNTER — Ambulatory Visit: Payer: Medicare Other | Admitting: Family Medicine

## 2016-10-31 ENCOUNTER — Other Ambulatory Visit
Admission: RE | Admit: 2016-10-31 | Discharge: 2016-10-31 | Disposition: A | Discharge status: Home / Self Care | Payer: Medicare Other | Source: Ambulatory Visit | Attending: Gastroenterology | Admitting: Gastroenterology

## 2016-10-31 ENCOUNTER — Ambulatory Visit (INDEPENDENT_AMBULATORY_CARE_PROVIDER_SITE_OTHER): Payer: Medicare Other | Admitting: Gastroenterology

## 2016-10-31 ENCOUNTER — Encounter: Payer: Self-pay | Admitting: Gastroenterology

## 2016-10-31 VITALS — BP 145/72 | HR 88 | Temp 98.8°F | Ht 67.5 in | Wt 148.6 lb

## 2016-10-31 DIAGNOSIS — D649 Anemia, unspecified: Secondary | ICD-10-CM | POA: Insufficient documentation

## 2016-10-31 LAB — IRON AND TIBC
Iron: 55 ug/dL (ref 28–170)
SATURATION RATIOS: 16 % (ref 10.4–31.8)
TIBC: 339 ug/dL (ref 250–450)
UIBC: 284 ug/dL

## 2016-10-31 LAB — FERRITIN: FERRITIN: 18 ng/mL (ref 11–307)

## 2016-10-31 NOTE — Progress Notes (Signed)
Mary Bellows MD, MRCP(U.K) [address]  [place]  [place], [place] [address]  Main: [phone]  Fax: [phone]   Gastroenterology Consultation  Referring Provider:     [name], DO Primary Care Physician:  Mary Roys, DO Primary Gastroenterologist:  Dr. Jonathon Hoover  Reason for Consultation:     Anemia         HPI:   Mary Hoover is a 71 y.o. y/o female referred for consultation & management  by Dr. Wynetta Hoover, Mary P, DO.    She has been referred by Dr Mary Hoover for anemia. MCV 90 . Hb 12.6 grams in 12/2014   CBC Latest Ref Rng & Units 09/26/2016 03/07/2016 01/11/2016  WBC 3.4 - 10.8 x10E3/uL 9.4 12.0(H) 9.2  Hemoglobin 11.1 - 15.9 g/dL 10.3(L) 11.5(L) 11.9  Hematocrit 34.0 - 46.6 % 30.8(L) 33.2(L) 35.1  Platelets 150 - 379 x10E3/uL 310 304 333    Rectal bleeding: none Nose bleeds: none  Vaginal bleeding : 3 days of vaginal bleeding - 2-3 weeks back  Hematemesis or hemoptysis : coughed up some blood a year back and was seen by a "lung doctor" had a MRI of her lungs which was normal and was told she had a lung infection  Blood in urine : none   Last colonoscopy - never had one. No family history of colon cancer. Sometimes her stool is watery(diarrhea) has to take a pill but no all the time. Denies any weight loss. Rather gained weight .     Past Medical History:  Diagnosis Date  . Carotid atherosclerosis   . Carotid bruit   . COPD (chronic obstructive pulmonary disease) (HCC)    emphysema  . Hyperlipidemia   . Hypertension   . Hypothyroidism   . Infected cat bite 1980s   was hospitalized  . Tobacco use     Past Surgical History:  Procedure Laterality Date  . CAROTID ENDARTERECTOMY Right Oct. 2015   Dr. Lucky Hoover  . CAROTID STENOSIS Left April 2016   carotid stenosis surgery  . FLEXIBLE BRONCHOSCOPY N/A 12/17/2014   Procedure: FLEXIBLE BRONCHOSCOPY;  Surgeon: Mary Mcardle, MD;  Location: [facility] ORS;  Service: Pulmonary;  Laterality: N/A;  .  INCISION AND DRAINAGE / EXCISION THYROGLOSSAL CYST  April 2011    Prior to Admission medications   Medication Sig Start Date End Date Taking? Authorizing Provider  aspirin 81 MG tablet Take 81 mg by mouth daily.    [provider]  atorvastatin (LIPITOR) 20 MG tablet Take 1 tablet (20 mg total) by mouth at bedtime. 09/26/16   Johnson, Mary P, DO  budesonide-formoterol (SYMBICORT) 160-4.5 MCG/ACT inhaler Inhale 2 puffs into the lungs 2 (two) times daily. 09/26/16   Park Liter P, DO  clopidogrel (PLAVIX) 75 MG tablet TAKE 1 TABLET BY MOUTH  DAILY 05/22/16   Algernon Huxley, MD  ferrous sulfate 325 (65 FE) MG EC tablet Take 1 tablet (325 mg total) by mouth 3 (three) times daily with meals. 09/27/16   Johnson, Mary P, DO  levothyroxine (SYNTHROID, LEVOTHROID) 75 MCG tablet Take 1 tablet (75 mcg total) by mouth daily. 09/27/16   Johnson, Mary P, DO  lisinopril (PRINIVIL,ZESTRIL) 20 MG tablet Take 1 tablet (20 mg total) by mouth daily. 09/26/16   Park Liter P, DO  meclizine (ANTIVERT) 25 MG tablet Take 1 tablet (25 mg total) by mouth 3 (three) times daily as needed for dizziness or nausea. 09/26/16   Mary Roys, DO  pantoprazole (PROTONIX) 20 MG tablet TAKE 1 TABLET BY MOUTH  DAILY 09/15/16   Mary American, PA-C  vitamin B-12 (CYANOCOBALAMIN) 500 MCG tablet Take 500 mcg by mouth daily.    [provider]    Family History  Problem Relation Age of Onset  . Congestive Heart Failure Mother   . Heart disease Mother   . Hypertension Mother   . Hypertension Sister   . Diabetes Sister   . Heart disease Sister   . Cancer Brother        liver, lung  . Heart disease Maternal Grandmother   . Heart disease Maternal Uncle   . Stroke Maternal Grandfather   . Heart disease Brother   . Hypertension Brother   . COPD Neg Hx      Social History  Substance Use Topics  . Smoking status: Current Every Day Smoker    Packs/day: 0.50    Years: 50.00    Types: Cigarettes     Last attempt to quit: 05/13/2015  . Smokeless tobacco: Never Used  . Alcohol use No     Comment: occasional    Allergies as of 10/31/2016  . (No Known Allergies)    Review of Systems:    All systems reviewed and negative except where noted in HPI.   Physical Exam:  There were no vitals taken for this visit. No LMP recorded. Patient is postmenopausal. Psych:  Alert and cooperative. Normal mood and affect. General:   Alert,  Well-developed, well-nourished, pleasant and cooperative in NAD Head:  Normocephalic and atraumatic. Eyes:  Sclera clear, no icterus.   Conjunctiva pink. Ears:  Normal auditory acuity. Nose:  No deformity, discharge, or lesions. Mouth:  No deformity or lesions,oropharynx pink & moist. Neck:  Supple; no masses or thyromegaly. Lungs:  Respirations even and unlabored.  Clear throughout to auscultation.   No wheezes, crackles, or rhonchi. No acute distress. Heart:  Regular rate and rhythm; no murmurs, clicks, rubs, or gallops. Abdomen:  Normal bowel sounds.  No bruits.  Soft, non-tender and non-distended without masses, hepatosplenomegaly or hernias noted.  No guarding or rebound tenderness.    Extremities:  No clubbing or edema.  No cyanosis. Neurologic:  Alert and oriented x3;  grossly normal neurologically. Skin:  Intact without significant lesions or rashes. No jaundice. Lymph Nodes:  No significant cervical adenopathy. Psych:  Alert and cooperative. Normal mood and affect.  Imaging Studies: No results found.  Assessment and Plan:   Mary Hoover is a 71 y.o. y/o female has been referred for  normocytic anemia .She also has had some post menopausal bleeding which she went back and forth with if she did or didn't have vaginal bleeding , never had a pelvic exam , smoker. I suggested if she has had vaginal bleeding strongly undergo a GYN exam .  Plan  1. Check iron studies , b12,folate ,ferritin. If she is iron deficient will need EGD+colonoscopy otherwise  just a colonoscopy  2. If iron deficiency and if (1) is normal then will need small bowel capsule  3. Continue oral iron  Follow up in 3 months .  Dr Mary Bellows MD,MRCP(U.K) .

## 2016-11-02 ENCOUNTER — Other Ambulatory Visit: Payer: Self-pay

## 2016-11-02 ENCOUNTER — Telehealth: Payer: Self-pay

## 2016-11-02 ENCOUNTER — Ambulatory Visit: Payer: Medicare Other | Admitting: Family Medicine

## 2016-11-02 DIAGNOSIS — D649 Anemia, unspecified: Secondary | ICD-10-CM

## 2016-11-02 NOTE — Telephone Encounter (Signed)
Advised patient of results per Dr. Vicente Males.   Pt has scheduled upper & lower for 10/10  Pt to come by and get prep information including split dose application.

## 2016-11-08 ENCOUNTER — Ambulatory Visit: Payer: Medicare Other | Admitting: Anesthesiology

## 2016-11-08 ENCOUNTER — Encounter
Admission: RE | Disposition: A | Discharge status: Home / Self Care | Payer: Self-pay | Source: Ambulatory Visit | Attending: Gastroenterology

## 2016-11-08 ENCOUNTER — Ambulatory Visit
Admission: RE | Admit: 2016-11-08 | Discharge: 2016-11-08 | Disposition: A | Discharge status: Home / Self Care | Payer: Medicare Other | Source: Ambulatory Visit | Attending: Gastroenterology | Admitting: Gastroenterology

## 2016-11-08 DIAGNOSIS — D509 Iron deficiency anemia, unspecified: Secondary | ICD-10-CM | POA: Diagnosis not present

## 2016-11-08 DIAGNOSIS — Z79899 Other long term (current) drug therapy: Secondary | ICD-10-CM | POA: Diagnosis not present

## 2016-11-08 DIAGNOSIS — J439 Emphysema, unspecified: Secondary | ICD-10-CM | POA: Diagnosis not present

## 2016-11-08 DIAGNOSIS — F17210 Nicotine dependence, cigarettes, uncomplicated: Secondary | ICD-10-CM | POA: Insufficient documentation

## 2016-11-08 DIAGNOSIS — Z7902 Long term (current) use of antithrombotics/antiplatelets: Secondary | ICD-10-CM | POA: Insufficient documentation

## 2016-11-08 DIAGNOSIS — I739 Peripheral vascular disease, unspecified: Secondary | ICD-10-CM | POA: Diagnosis not present

## 2016-11-08 DIAGNOSIS — Z8249 Family history of ischemic heart disease and other diseases of the circulatory system: Secondary | ICD-10-CM | POA: Diagnosis not present

## 2016-11-08 DIAGNOSIS — J449 Chronic obstructive pulmonary disease, unspecified: Secondary | ICD-10-CM | POA: Diagnosis not present

## 2016-11-08 DIAGNOSIS — K2980 Duodenitis without bleeding: Secondary | ICD-10-CM | POA: Diagnosis not present

## 2016-11-08 DIAGNOSIS — D122 Benign neoplasm of ascending colon: Secondary | ICD-10-CM | POA: Insufficient documentation

## 2016-11-08 DIAGNOSIS — E785 Hyperlipidemia, unspecified: Secondary | ICD-10-CM | POA: Diagnosis not present

## 2016-11-08 DIAGNOSIS — Z7982 Long term (current) use of aspirin: Secondary | ICD-10-CM | POA: Insufficient documentation

## 2016-11-08 DIAGNOSIS — I10 Essential (primary) hypertension: Secondary | ICD-10-CM | POA: Insufficient documentation

## 2016-11-08 DIAGNOSIS — K635 Polyp of colon: Secondary | ICD-10-CM | POA: Diagnosis not present

## 2016-11-08 DIAGNOSIS — D649 Anemia, unspecified: Secondary | ICD-10-CM | POA: Diagnosis not present

## 2016-11-08 DIAGNOSIS — E039 Hypothyroidism, unspecified: Secondary | ICD-10-CM | POA: Diagnosis not present

## 2016-11-08 DIAGNOSIS — K5790 Diverticulosis of intestine, part unspecified, without perforation or abscess without bleeding: Secondary | ICD-10-CM | POA: Diagnosis not present

## 2016-11-08 DIAGNOSIS — K219 Gastro-esophageal reflux disease without esophagitis: Secondary | ICD-10-CM | POA: Diagnosis not present

## 2016-11-08 DIAGNOSIS — K5730 Diverticulosis of large intestine without perforation or abscess without bleeding: Secondary | ICD-10-CM | POA: Diagnosis not present

## 2016-11-08 HISTORY — PX: ESOPHAGOGASTRODUODENOSCOPY (EGD) WITH PROPOFOL: SHX5813

## 2016-11-08 HISTORY — PX: COLONOSCOPY WITH PROPOFOL: SHX5780

## 2016-11-08 SURGERY — COLONOSCOPY WITH PROPOFOL
Anesthesia: General

## 2016-11-08 MED ORDER — PROPOFOL 500 MG/50ML IV EMUL
INTRAVENOUS | Status: DC | PRN
Start: 2016-11-08 — End: 2016-11-08
  Administered 2016-11-08: 100 ug/kg/min via INTRAVENOUS

## 2016-11-08 MED ORDER — PROPOFOL 10 MG/ML IV BOLUS
INTRAVENOUS | Status: DC | PRN
Start: 2016-11-08 — End: 2016-11-08
  Administered 2016-11-08: 90 mg via INTRAVENOUS

## 2016-11-08 MED ORDER — SODIUM CHLORIDE 0.9 % IV SOLN
INTRAVENOUS | Status: DC
  Administered 2016-11-08: 1000 mL via INTRAVENOUS

## 2016-11-08 MED ORDER — PROPOFOL 500 MG/50ML IV EMUL
INTRAVENOUS | Status: AC
  Filled 2016-11-08: qty 50

## 2016-11-08 NOTE — Transfer of Care (Signed)
Immediate Anesthesia Transfer of Care Note  Patient: Mary Hoover  Procedure(s) Performed: COLONOSCOPY WITH PROPOFOL (N/A ) ESOPHAGOGASTRODUODENOSCOPY (EGD) WITH PROPOFOL (N/A )  Patient Location: PACU and Endoscopy Unit  Anesthesia Type:General  Level of Consciousness: sedated and responds to stimulation  Airway & Oxygen Therapy: Patient Spontanous Breathing and Patient connected to nasal cannula oxygen  Post-op Assessment: Report given to RN and Post -op Vital signs reviewed and stable  Post vital signs: Reviewed and stable  Last Vitals:  Vitals:   11/08/16 1321 11/08/16 1456  BP: (!) 153/50 (!) 96/51  Pulse: 89 61  Resp: 18 16  Temp: 36.7 C   SpO2: 100% 98%    Last Pain: There were no vitals filed for this visit.    Patients Stated Pain Goal: 0 (77/03/40 3524)  Complications: No apparent anesthesia complications

## 2016-11-08 NOTE — Anesthesia Post-op Follow-up Note (Signed)
Anesthesia QCDR form completed.        

## 2016-11-08 NOTE — Anesthesia Preprocedure Evaluation (Signed)
Anesthesia Evaluation  Patient identified by MRN, date of birth, ID band Patient awake    Reviewed: Allergy & Precautions, NPO status , Patient's Chart, lab work & pertinent test results  History of Anesthesia Complications Negative for: history of anesthetic complications  Airway Mallampati: III       Dental   Pulmonary COPD,  COPD inhaler, Current Smoker,           Cardiovascular hypertension, Pt. on medications + Peripheral Vascular Disease  (-) Past MI and (-) CHF (-) dysrhythmias (-) Valvular Problems/Murmurs     Neuro/Psych neg Seizures    GI/Hepatic Neg liver ROS, GERD  Medicated,  Endo/Other  neg diabetesHypothyroidism   Renal/GU negative Renal ROS     Musculoskeletal   Abdominal   Peds  Hematology   Anesthesia Other Findings   Reproductive/Obstetrics                             Anesthesia Physical Anesthesia Plan  ASA: III  Anesthesia Plan: General   Post-op Pain Management:    Induction: Intravenous  PONV Risk Score and Plan: Propofol infusion  Airway Management Planned: Nasal Cannula  Additional Equipment:   Intra-op Plan:   Post-operative Plan:   Informed Consent: I have reviewed the patients History and Physical, chart, labs and discussed the procedure including the risks, benefits and alternatives for the proposed anesthesia with the patient or authorized representative who has indicated his/her understanding and acceptance.     Plan Discussed with:   Anesthesia Plan Comments:         Anesthesia Quick Evaluation

## 2016-11-08 NOTE — H&P (Signed)
Jonathon Bellows MD [address], [place], [place] [address] Phone: [phone] Fax : [phone]  Primary Care Physician:  [name], DO Primary Gastroenterologist:  Dr. Jonathon Bellows   Pre-Procedure History & Physical: HPI:  Mary Hoover is a 71 y.o. female is here for an endoscopy and colonoscopy.   Past Medical History:  Diagnosis Date  . Carotid atherosclerosis   . Carotid bruit   . COPD (chronic obstructive pulmonary disease) (HCC)    emphysema  . Hyperlipidemia   . Hypertension   . Hypothyroidism   . Infected cat bite 1980s   was hospitalized  . Tobacco use     Past Surgical History:  Procedure Laterality Date  . CAROTID ENDARTERECTOMY Right Oct. 2015   Dr. Lucky Cowboy  . CAROTID STENOSIS Left April 2016   carotid stenosis surgery  . FLEXIBLE BRONCHOSCOPY N/A 12/17/2014   Procedure: FLEXIBLE BRONCHOSCOPY;  Surgeon: Wilhelmina Mcardle, MD;  Location: [facility] ORS;  Service: Pulmonary;  Laterality: N/A;  . INCISION AND DRAINAGE / EXCISION THYROGLOSSAL CYST  April 2011    Prior to Admission medications   Medication Sig Start Date End Date Taking? Authorizing Provider  aspirin 81 MG tablet Take 81 mg by mouth daily.    [provider]  atorvastatin (LIPITOR) 20 MG tablet Take 1 tablet (20 mg total) by mouth at bedtime. 09/26/16   Johnson, Megan P, DO  budesonide-formoterol (SYMBICORT) 160-4.5 MCG/ACT inhaler Inhale 2 puffs into the lungs 2 (two) times daily. 09/26/16   Park Liter P, DO  clopidogrel (PLAVIX) 75 MG tablet TAKE 1 TABLET BY MOUTH  DAILY 05/22/16   Algernon Huxley, MD  ferrous sulfate 325 (65 FE) MG EC tablet Take 1 tablet (325 mg total) by mouth 3 (three) times daily with meals. 09/27/16   Johnson, Megan P, DO  levothyroxine (SYNTHROID, LEVOTHROID) 75 MCG tablet Take 1 tablet (75 mcg total) by mouth daily. 09/27/16   Johnson, Megan P, DO  lisinopril (PRINIVIL,ZESTRIL) 20 MG tablet Take 1 tablet (20 mg total) by mouth daily. 09/26/16   Park Liter P,  DO  meclizine (ANTIVERT) 25 MG tablet Take 1 tablet (25 mg total) by mouth 3 (three) times daily as needed for dizziness or nausea. Patient not taking: Reported on 10/31/2016 09/26/16   Park Liter P, DO  pantoprazole (PROTONIX) 20 MG tablet TAKE 1 TABLET BY MOUTH  DAILY Patient not taking: Reported on 10/31/2016 09/15/16   Volney American, PA-C  vitamin B-12 (CYANOCOBALAMIN) 500 MCG tablet Take 500 mcg by mouth daily.    [provider]    Allergies as of 11/02/2016  . (No Known Allergies)    Family History  Problem Relation Age of Onset  . Congestive Heart Failure Mother   . Heart disease Mother   . Hypertension Mother   . Hypertension Sister   . Diabetes Sister   . Heart disease Sister   . Cancer Brother        liver, lung  . Heart disease Maternal Grandmother   . Heart disease Maternal Uncle   . Stroke Maternal Grandfather   . Heart disease Brother   . Hypertension Brother   . COPD Neg Hx     Social History   Social History  . Marital status: Married    Spouse name: N/A  . Number of children: N/A  . Years of education: N/A   Occupational History  . Not on file.   Social History Main Topics  . Smoking status:  Current Every Day Smoker    Packs/day: 0.50    Years: 50.00    Types: Cigarettes    Last attempt to quit: 05/13/2015  . Smokeless tobacco: Never Used  . Alcohol use No     Comment: occasional  . Drug use: No  . Sexual activity: Yes   Other Topics Concern  . Not on file   Social History Narrative   Lives at home with husband    Review of Systems: See HPI, otherwise negative ROS  Physical Exam: There were no vitals taken for this visit. General:   Alert,  pleasant and cooperative in NAD Head:  Normocephalic and atraumatic. Neck:  Supple; no masses or thyromegaly. Lungs:  Clear throughout to auscultation.    Heart:  Regular rate and rhythm. Abdomen:  Soft, nontender and nondistended. Normal bowel sounds, without guarding, and  without rebound.   Neurologic:  Alert and  oriented x4;  grossly normal neurologically.  Impression/Plan: Mary Hoover is here for an endoscopy and colonoscopy to be performed for iron deficiency anemia   Risks, benefits, limitations, and alternatives regarding  endoscopy and colonoscopy have been reviewed with the patient.  Questions have been answered.  All parties agreeable.   Jonathon Bellows, MD  11/08/2016, 1:18 PM

## 2016-11-08 NOTE — Op Note (Signed)
[facility] Gastroenterology Patient Name: Mary Hoover Procedure Date: 11/08/2016 2:06 PM MRN: [id number] Account #: [id number] Date of Birth: 1945-08-27 Admit Type: Outpatient Age: 71 Room: [facility] ENDO ROOM 1 Gender: Female Note Status: Finalized Procedure:            Upper GI endoscopy Indications:          Iron deficiency anemia Providers:            Jonathon Bellows MD, MD Referring MD:         [name] (Referring MD) Medicines:            Monitored Anesthesia Care Complications:        No immediate complications. Procedure:            Pre-Anesthesia Assessment:                       - Prior to the procedure, a History and Physical was                        performed, and patient medications, allergies and                        sensitivities were reviewed. The patient's tolerance of                        previous anesthesia was reviewed.                       - The risks and benefits of the procedure and the                        sedation options and risks were discussed with the                        patient. All questions were answered and informed                        consent was obtained.                       - ASA Grade Assessment: III - A patient with severe                        systemic disease.                       After obtaining informed consent, the endoscope was                        passed under direct vision. Throughout the procedure,                        the patient's blood pressure, pulse, and oxygen                        saturations were monitored continuously. The Endoscope                        was introduced through the mouth, and advanced to the  third part of duodenum. The upper GI endoscopy was                        accomplished with ease. The patient tolerated the                        procedure well. Findings:      The esophagus was normal.      The stomach was normal.      The examined  duodenum was normal. Biopsies for histology were taken with       a cold forceps for evaluation of celiac disease.      The cardia and gastric fundus were normal on retroflexion. Impression:           - Normal esophagus.                       - Normal stomach.                       - Normal examined duodenum. Biopsied. Recommendation:       - Await pathology results.                       - Perform a colonoscopy today. Procedure Code(s):    --- Professional ---                       [phone], Esophagogastroduodenoscopy, flexible, transoral;                        with biopsy, single or multiple Diagnosis Code(s):    --- Professional ---                       D50.9, Iron deficiency anemia, unspecified CPT copyright 2016 American Medical Association. All rights reserved. The codes documented in this report are preliminary and upon coder review may  be revised to meet current compliance requirements. Jonathon Bellows, MD Jonathon Bellows MD, MD 11/08/2016 2:23:41 PM This report has been signed electronically. Number of Addenda: 0 Note Initiated On: 11/08/2016 2:06 PM      [facility]

## 2016-11-08 NOTE — Op Note (Signed)
[facility] Gastroenterology Patient Name: Mary Hoover Procedure Date: 11/08/2016 2:06 PM MRN: [id number] Account #: [id number] Date of Birth: May 26, 1945 Admit Type: Outpatient Age: 71 Room: [facility] ENDO ROOM 1 Gender: Female Note Status: Finalized Procedure:            Colonoscopy Indications:          Iron deficiency anemia Providers:            Jonathon Bellows MD, MD Referring MD:         [name] (Referring MD) Medicines:            Monitored Anesthesia Care Complications:        No immediate complications. Procedure:            Pre-Anesthesia Assessment:                       - Prior to the procedure, a History and Physical was                        performed, and patient medications, allergies and                        sensitivities were reviewed. The patient's tolerance of                        previous anesthesia was reviewed.                       - The risks and benefits of the procedure and the                        sedation options and risks were discussed with the                        patient. All questions were answered and informed                        consent was obtained.                       - ASA Grade Assessment: III - A patient with severe                        systemic disease.                       After obtaining informed consent, the colonoscope was                        passed under direct vision. Throughout the procedure,                        the patient's blood pressure, pulse, and oxygen                        saturations were monitored continuously. The                        Colonoscope was introduced through the anus and                        advanced  to the the cecum, identified by the                        appendiceal orifice, IC valve and transillumination.                        The colonoscopy was performed with ease. The patient                        tolerated the procedure well. The quality of the bowel                         preparation was good. Findings:      The perianal and digital rectal examinations were normal.      A 3 mm polyp was found in the ascending colon. The polyp was sessile.       The polyp was removed with a cold biopsy forceps. Resection and       retrieval were complete.      A few small-mouthed diverticula were found in the sigmoid colon.      The exam was otherwise without abnormality on direct and retroflexion       views. Impression:           - One 3 mm polyp in the ascending colon, removed with a                        cold biopsy forceps. Resected and retrieved.                       - Diverticulosis in the sigmoid colon.                       - The examination was otherwise normal on direct and                        retroflexion views. Recommendation:       - Discharge patient to home (with escort).                       - Resume previous diet.                       - Continue present medications.                       - Await pathology results.                       - Repeat colonoscopy in 5-10 years for surveillance                        based on pathology results.                       - Return to GI clinic in 8 weeks. Procedure Code(s):    --- Professional ---                       [phone], Colonoscopy, flexible; with biopsy, single or                        multiple Diagnosis Code(s):    ---  Professional ---                       D12.2, Benign neoplasm of ascending colon                       D50.9, Iron deficiency anemia, unspecified                       K57.30, Diverticulosis of large intestine without                        perforation or abscess without bleeding CPT copyright 2016 American Medical Association. All rights reserved. The codes documented in this report are preliminary and upon coder review may  be revised to meet current compliance requirements. Jonathon Bellows, MD Jonathon Bellows MD, MD 11/08/2016 2:51:41 PM This report has been signed  electronically. Number of Addenda: 0 Note Initiated On: 11/08/2016 2:06 PM Scope Withdrawal Time: 0 hours 13 minutes 57 seconds  Total Procedure Duration: 0 hours 23 minutes 56 seconds       [facility]

## 2016-11-09 ENCOUNTER — Encounter: Payer: Self-pay | Admitting: Gastroenterology

## 2016-11-11 LAB — SURGICAL PATHOLOGY

## 2016-11-13 NOTE — Anesthesia Postprocedure Evaluation (Signed)
Anesthesia Post Note  Patient: Mary Hoover  Procedure(s) Performed: COLONOSCOPY WITH PROPOFOL (N/A ) ESOPHAGOGASTRODUODENOSCOPY (EGD) WITH PROPOFOL (N/A )  Patient location during evaluation: Endoscopy Anesthesia Type: General Level of consciousness: awake and alert Pain management: pain level controlled Vital Signs Assessment: post-procedure vital signs reviewed and stable Respiratory status: spontaneous breathing, nonlabored ventilation, respiratory function stable and patient connected to nasal cannula oxygen Cardiovascular status: blood pressure returned to baseline and stable Postop Assessment: no apparent nausea or vomiting Anesthetic complications: no     Last Vitals:  Vitals:   11/08/16 1456 11/08/16 1525  BP: (!) 96/51   Pulse: 61   Resp: 16   Temp:    SpO2: 98% 100%    Last Pain: There were no vitals filed for this visit.               Precious Haws Piscitello

## 2016-11-14 ENCOUNTER — Encounter (INDEPENDENT_AMBULATORY_CARE_PROVIDER_SITE_OTHER): Payer: Self-pay | Admitting: Vascular Surgery

## 2016-11-14 ENCOUNTER — Ambulatory Visit (INDEPENDENT_AMBULATORY_CARE_PROVIDER_SITE_OTHER): Payer: Medicare Other

## 2016-11-14 ENCOUNTER — Ambulatory Visit (INDEPENDENT_AMBULATORY_CARE_PROVIDER_SITE_OTHER): Payer: Medicare Other | Admitting: Vascular Surgery

## 2016-11-14 VITALS — BP 159/71 | HR 75 | Resp 16 | Ht 68.0 in | Wt 148.8 lb

## 2016-11-14 DIAGNOSIS — I6523 Occlusion and stenosis of bilateral carotid arteries: Secondary | ICD-10-CM | POA: Diagnosis not present

## 2016-11-14 DIAGNOSIS — I6529 Occlusion and stenosis of unspecified carotid artery: Secondary | ICD-10-CM | POA: Insufficient documentation

## 2016-11-14 DIAGNOSIS — E782 Mixed hyperlipidemia: Secondary | ICD-10-CM

## 2016-11-14 DIAGNOSIS — I779 Disorder of arteries and arterioles, unspecified: Secondary | ICD-10-CM

## 2016-11-14 DIAGNOSIS — I739 Peripheral vascular disease, unspecified: Principal | ICD-10-CM

## 2016-11-14 DIAGNOSIS — I10 Essential (primary) hypertension: Secondary | ICD-10-CM | POA: Diagnosis not present

## 2016-11-14 NOTE — Assessment & Plan Note (Signed)
She is about 3 years status post right carotid endarterectomy in about 2-1/2 years status post left carotid endarterectomy.  She remains on aspirin, Plavix, and Lipitor. Her carotid duplex today reveals a patent right carotid endarterectomy and left carotid endarterectomy with mild intimal hyperplasia creating stenosis on the lower end of the 40-59% range.  This is stable from previous. No role for intervention at current.  Continue to follow on an annual basis with duplex.

## 2016-11-14 NOTE — Patient Instructions (Signed)
Carotid Artery Disease The carotid arteries are arteries on both sides of the neck. They carry blood to the brain. Carotid artery disease is when the arteries get smaller (narrow) or get blocked. If these arteries get smaller or get blocked, you are more likely to have a stroke or warning stroke (transient ischemic attack). Follow these instructions at home:  Take medicines as told by your doctor. Make sure you understand all your medicine instructions. Do not stop your medicines without talking to your doctor first.  Follow your doctor's diet instructions. It is important to eat a healthy diet that includes plenty of: ? Fresh fruits. ? Vegetables. ? Lean meats.  Avoid: ? High-fat foods. ? High-sodium foods. ? Foods that are fried, overly processed, or have poor nutritional value.  Stay a healthy weight.  Stay active. Get at least 30 minutes of activity every day.  Do not smoke.  Limit alcohol use to: ? No more than 2 drinks a day for men. ? No more than 1 drink a day for women who are not pregnant.  Do not use illegal drugs.  Keep all doctor visits as told. Get help right away if:  You have sudden weakness or loss of feeling (numbness) on one side of the body, such as the face, arm, or leg.  You have sudden confusion.  You have trouble speaking (aphasia) or understanding.  You have sudden trouble seeing out of one or both eyes.  You have sudden trouble walking.  You have dizziness or feel like you might pass out (faint).  You have a loss of balance or your movements are not steady (uncoordinated).  You have a sudden, severe headache with no known cause.  You have trouble swallowing (dysphagia). Call your local emergency services (911 in [place] Do notdrive yourself to the clinic or hospital. This information is not intended to replace advice given to you by your health care provider. Make sure you discuss any questions you have with your health care  provider. Document Released: 01/03/2012 Document Revised: 06/24/2015 Document Reviewed: 07/17/2012 Elsevier Interactive Patient Education  2018 Elsevier Inc.  

## 2016-11-14 NOTE — Assessment & Plan Note (Signed)
lipid control important in reducing the progression of atherosclerotic disease. Continue statin therapy  

## 2016-11-14 NOTE — Progress Notes (Signed)
MRN : [id number]  Mary Hoover is a 71 y.o. (Dec 11, 1945) female who presents with chief complaint of  Chief Complaint  Patient presents with  . Carotid    1 yr follow up  .  History of Present Illness: Patient returns in follow-up of her carotid disease.  She is doing well today without specific complaints.  She denies any focal neurologic symptoms. Specifically, the patient denies amaurosis fugax, speech or swallowing difficulties, or arm or leg weakness or numbness.  Her carotid duplex today reveals a patent right carotid endarterectomy and left carotid endarterectomy with mild intimal hyperplasia creating stenosis on the lower end of the 40-59% range.  This is stable from previous.  Current Outpatient Prescriptions  Medication Sig Dispense Refill  . aspirin 81 MG tablet Take 81 mg by mouth daily.    Marland Kitchen atorvastatin (LIPITOR) 20 MG tablet Take 1 tablet (20 mg total) by mouth at bedtime. 90 tablet 1  . budesonide-formoterol (SYMBICORT) 160-4.5 MCG/ACT inhaler Inhale 2 puffs into the lungs 2 (two) times daily. 3 Inhaler 4  . clopidogrel (PLAVIX) 75 MG tablet TAKE 1 TABLET BY MOUTH  DAILY 90 tablet 3  . ferrous sulfate 325 (65 FE) MG EC tablet Take 1 tablet (325 mg total) by mouth 3 (three) times daily with meals. 90 tablet 3  . levothyroxine (SYNTHROID, LEVOTHROID) 75 MCG tablet Take 1 tablet (75 mcg total) by mouth daily. 90 tablet 3  . lisinopril (PRINIVIL,ZESTRIL) 20 MG tablet Take 1 tablet (20 mg total) by mouth daily. 90 tablet 1  . meclizine (ANTIVERT) 25 MG tablet Take 1 tablet (25 mg total) by mouth 3 (three) times daily as needed for dizziness or nausea. 30 tablet 1  . pantoprazole (PROTONIX) 20 MG tablet TAKE 1 TABLET BY MOUTH  DAILY 90 tablet 0  . vitamin B-12 (CYANOCOBALAMIN) 500 MCG tablet Take 500 mcg by mouth daily.     No current facility-administered medications for this visit.     Past Medical History:  Diagnosis Date  . Carotid atherosclerosis   . Carotid  bruit   . COPD (chronic obstructive pulmonary disease) (HCC)    emphysema  . Hyperlipidemia   . Hypertension   . Hypothyroidism   . Infected cat bite 1980s   was hospitalized  . Tobacco use     Past Surgical History:  Procedure Laterality Date  . CAROTID ENDARTERECTOMY Right Oct. 2015   Dr. Lucky Cowboy  . CAROTID STENOSIS Left April 2016   carotid stenosis surgery  . COLONOSCOPY WITH PROPOFOL N/A 11/08/2016   Procedure: COLONOSCOPY WITH PROPOFOL;  Surgeon: Jonathon Bellows, MD;  Location: [facility] ENDOSCOPY;  Service: Gastroenterology;  Laterality: N/A;  . ESOPHAGOGASTRODUODENOSCOPY (EGD) WITH PROPOFOL N/A 11/08/2016   Procedure: ESOPHAGOGASTRODUODENOSCOPY (EGD) WITH PROPOFOL;  Surgeon: Jonathon Bellows, MD;  Location: [facility] ENDOSCOPY;  Service: Gastroenterology;  Laterality: N/A;  . FLEXIBLE BRONCHOSCOPY N/A 12/17/2014   Procedure: FLEXIBLE BRONCHOSCOPY;  Surgeon: Wilhelmina Mcardle, MD;  Location: [facility] ORS;  Service: Pulmonary;  Laterality: N/A;  . INCISION AND DRAINAGE / EXCISION THYROGLOSSAL CYST  April 2011    Social History Social History  Substance Use Topics  . Smoking status: Current Every Day Smoker    Packs/day: 0.50    Years: 50.00    Types: Cigarettes    Last attempt to quit: 05/13/2015  . Smokeless tobacco: Never Used  . Alcohol use No     Comment: occasional    Family History Family History  Problem Relation Age of Onset  .  Congestive Heart Failure Mother   . Heart disease Mother   . Hypertension Mother   . Hypertension Sister   . Diabetes Sister   . Heart disease Sister   . Cancer Brother        liver, lung  . Heart disease Maternal Grandmother   . Heart disease Maternal Uncle   . Stroke Maternal Grandfather   . Heart disease Brother   . Hypertension Brother   . COPD Neg Hx     No Known Allergies   REVIEW OF SYSTEMS (Negative unless checked)  Constitutional: _0 Weight loss  _1 Fever  _2 Chills Cardiac: _3 Chest pain   _4 Chest pressure   _5 Palpitations   _6 Shortness of  breath when laying flat   _7 Shortness of breath at rest   _8 Shortness of breath with exertion. Vascular:  _9 Pain in legs with walking   _10 Pain in legs at rest   _11 Pain in legs when laying flat   _12 Claudication   _13 Pain in feet when walking  _14 Pain in feet at rest  _15 Pain in feet when laying flat   _16 History of DVT   _17 Phlebitis   _18 Swelling in legs   _19 Varicose veins   _20 Non-healing ulcers Pulmonary:   _21 Uses home oxygen   _22 Productive cough   _23 Hemoptysis   _24 Wheeze  _25 COPD   _26 Asthma Neurologic:  _27 Dizziness  _28 Blackouts   _29 Seizures   _30 History of stroke   _31 History of TIA  _32 Aphasia   _33 Temporary blindness   _34 Dysphagia   _35 Weakness or numbness in arms   _36 Weakness or numbness in legs Musculoskeletal:  _37 Arthritis   _38 Joint swelling   _39 Joint pain   _40 Low back pain Hematologic:  _41 Easy bruising  _42 Easy bleeding   _43 Hypercoagulable state   _44 Anemic  _45 Hepatitis Gastrointestinal:  _46 Blood in stool   _47 Vomiting blood  _48 Gastroesophageal reflux/heartburn   _49 Difficulty swallowing. Genitourinary:  _50 Chronic kidney disease   _51 Difficult urination  _52 Frequent urination  _53 Burning with urination   _54 Blood in urine Skin:  _55 Rashes   _56 Ulcers   _57 Wounds Psychological:  _58 History of anxiety   _59  History of major depression.  Physical Examination  Vitals:   11/14/16 1430 11/14/16 1431  BP: (!) 153/70 (!) 159/71  Pulse: 78 75  Resp: 16   Weight: 148 lb 12.8 oz (67.5 kg)   Height: _60  (1.727 m)    Body mass index is 22.62 kg/m. Gen:  WD/WN, NAD.  Appears younger than stated age Head: [place]/AT, No temporalis wasting. Ear/Nose/Throat: Hearing grossly intact, nares w/o erythema or drainage, trachea midline Eyes: Conjunctiva clear. Sclera non-icteric Neck: Supple.  No bruit or JVD.  Pulmonary:  Good air movement, equal and clear to auscultation bilaterally.  Cardiac: RRR, normal S1, S2 Vascular:  Vessel Right Left  Radial Palpable Palpable                                     Musculoskeletal: M/S 5/5 throughout.  No deformity or atrophy. no edema. Neurologic: CN 2-12 intact. Sensation grossly intact in extremities.  Symmetrical.  Speech is fluent. Motor exam as listed above. Psychiatric: Judgment intact, Mood & affect appropriate for pt's clinical situation. Dermatologic: No rashes or ulcers noted.  No cellulitis or open wounds.      CBC Lab Results  Component Value Date   WBC 9.4 09/26/2016   HGB 10.3 (L) 09/26/2016   HCT 30.8 (L) 09/26/2016   MCV 90 09/26/2016   PLT 310 09/26/2016  BMET    Component Value Date/Time   NA 137 09/26/2016 1605   NA 129 (L) 05/14/2014 0623   K 4.6 09/26/2016 1605   K 4.3 05/14/2014 0623   CL 101 09/26/2016 1605   CL 96 (L) 05/14/2014 0623   CO2 19 (L) 09/26/2016 1605   CO2 27 05/14/2014 0623   GLUCOSE 90 09/26/2016 1605   GLUCOSE 117 (H) 03/07/2016 0929   GLUCOSE 100 (H) 05/14/2014 0623   BUN 15 09/26/2016 1605   BUN 11 05/14/2014 0623   CREATININE 1.00 09/26/2016 1605   CREATININE 0.78 05/14/2014 0623   CALCIUM 9.5 09/26/2016 1605   CALCIUM 8.7 (L) 05/14/2014 0623   GFRNONAA 58 (L) 09/26/2016 1605   GFRNONAA >60 05/14/2014 0623   GFRAA 66 09/26/2016 1605   GFRAA >60 05/14/2014 0623   CrCl cannot be calculated (Patient's most recent lab result is older than the maximum 21 days allowed.).  COAG Lab Results  Component Value Date   INR 1.01 12/11/2014   INR 1.0 05/14/2014   INR 1.0 05/05/2014    Radiology No results found.    Assessment/Plan Essential hypertension blood pressure control important in reducing the progression of atherosclerotic disease. On appropriate oral medications.   Hyperlipidemia lipid control important in reducing the progression of atherosclerotic disease. Continue statin therapy   Carotid atherosclerosis She is about 3 years status post right carotid endarterectomy in about 2-1/2 years status post left carotid endarterectomy.  She remains on aspirin, Plavix,  and Lipitor. Her carotid duplex today reveals a patent right carotid endarterectomy and left carotid endarterectomy with mild intimal hyperplasia creating stenosis on the lower end of the 40-59% range.  This is stable from previous. No role for intervention at current.  Continue to follow on an annual basis with duplex.    Leotis Pain, MD  11/14/2016 3:25 PM    This note was created with Dragon medical transcription system.  Any errors from dictation are purely unintentional

## 2016-11-14 NOTE — Assessment & Plan Note (Signed)
blood pressure control important in reducing the progression of atherosclerotic disease. On appropriate oral medications.  

## 2016-11-16 ENCOUNTER — Ambulatory Visit (INDEPENDENT_AMBULATORY_CARE_PROVIDER_SITE_OTHER): Payer: Medicare Other | Admitting: Family Medicine

## 2016-11-16 ENCOUNTER — Encounter: Payer: Self-pay | Admitting: Family Medicine

## 2016-11-16 VITALS — BP 123/73 | HR 81 | Temp 98.4°F | Wt 151.4 lb

## 2016-11-16 DIAGNOSIS — R42 Dizziness and giddiness: Secondary | ICD-10-CM | POA: Diagnosis not present

## 2016-11-16 DIAGNOSIS — Z23 Encounter for immunization: Secondary | ICD-10-CM

## 2016-11-16 DIAGNOSIS — M545 Low back pain, unspecified: Secondary | ICD-10-CM

## 2016-11-16 NOTE — Patient Instructions (Addendum)
Lidocaine patches 4% 1 patch for 12 hours, then change it out  Influenza (Flu) Vaccine (Inactivated or Recombinant): What You Need to Know 1. Why get vaccinated? Influenza ("flu") is a contagious disease that spreads around the [place] every year, usually between October and May. Flu is caused by influenza viruses, and is spread mainly by coughing, sneezing, and close contact. Anyone can get flu. Flu strikes suddenly and can last several days. Symptoms vary by age, but can include:  fever/chills  sore throat  muscle aches  fatigue  cough  headache  runny or stuffy nose  Flu can also lead to pneumonia and blood infections, and cause diarrhea and seizures in children. If you have a medical condition, such as heart or lung disease, flu can make it worse. Flu is more dangerous for some people. Infants and young children, people 41 years of age and older, pregnant women, and people with certain health conditions or a weakened immune system are at greatest risk. Each year thousands of people in the [place] die from flu, and many more are hospitalized. Flu vaccine can:  keep you from getting flu,  make flu less severe if you do get it, and  keep you from spreading flu to your family and other people. 2. Inactivated and recombinant flu vaccines A dose of flu vaccine is recommended every flu season. Children 6 months through 47 years of age may need two doses during the same flu season. Everyone else needs only one dose each flu season. Some inactivated flu vaccines contain a very small amount of a mercury-based preservative called thimerosal. Studies have not shown thimerosal in vaccines to be harmful, but flu vaccines that do not contain thimerosal are available. There is no live flu virus in flu shots. They cannot cause the flu. There are many flu viruses, and they are always changing. Each year a new flu vaccine is made to protect against three or four viruses that are  likely to cause disease in the upcoming flu season. But even when the vaccine doesn't exactly match these viruses, it may still provide some protection. Flu vaccine cannot prevent:  flu that is caused by a virus not covered by the vaccine, or  illnesses that look like flu but are not.  It takes about 2 weeks for protection to develop after vaccination, and protection lasts through the flu season. 3. Some people should not get this vaccine Tell the person who is giving you the vaccine:  If you have any severe, life-threatening allergies. If you ever had a life-threatening allergic reaction after a dose of flu vaccine, or have a severe allergy to any part of this vaccine, you may be advised not to get vaccinated. Most, but not all, types of flu vaccine contain a small amount of egg protein.  If you ever had Guillain-Barr Syndrome (also called GBS). Some people with a history of GBS should not get this vaccine. This should be discussed with your doctor.  If you are not feeling well. It is usually okay to get flu vaccine when you have a mild illness, but you might be asked to come back when you feel better.  4. Risks of a vaccine reaction With any medicine, including vaccines, there is a chance of reactions. These are usually mild and go away on their own, but serious reactions are also possible. Most people who get a flu shot do not have any problems with it. Minor problems following a flu shot include:  soreness, redness, or swelling where the shot was given  hoarseness  sore, red or itchy eyes  cough  fever  aches  headache  itching  fatigue  If these problems occur, they usually begin soon after the shot and last 1 or 2 days. More serious problems following a flu shot can include the following:  There may be a small increased risk of Guillain-Barre Syndrome (GBS) after inactivated flu vaccine. This risk has been estimated at 1 or 2 additional cases per million people  vaccinated. This is much lower than the risk of severe complications from flu, which can be prevented by flu vaccine.  Young children who get the flu shot along with pneumococcal vaccine (PCV13) and/or DTaP vaccine at the same time might be slightly more likely to have a seizure caused by fever. Ask your doctor for more information. Tell your doctor if a child who is getting flu vaccine has ever had a seizure.  Problems that could happen after any injected vaccine:  People sometimes faint after a medical procedure, including vaccination. Sitting or lying down for about 15 minutes can help prevent fainting, and injuries caused by a fall. Tell your doctor if you feel dizzy, or have vision changes or ringing in the ears.  Some people get severe pain in the shoulder and have difficulty moving the arm where a shot was given. This happens very rarely.  Any medication can cause a severe allergic reaction. Such reactions from a vaccine are very rare, estimated at about 1 in a million doses, and would happen within a few minutes to a few hours after the vaccination. As with any medicine, there is a very remote chance of a vaccine causing a serious injury or death. The safety of vaccines is always being monitored. For more information, visit: http://www.aguilar.org/ 5. What if there is a serious reaction? What should I look for? Look for anything that concerns you, such as signs of a severe allergic reaction, very high fever, or unusual behavior. Signs of a severe allergic reaction can include hives, swelling of the face and throat, difficulty breathing, a fast heartbeat, dizziness, and weakness. These would start a few minutes to a few hours after the vaccination. What should I do?  If you think it is a severe allergic reaction or other emergency that can't wait, call 9-1-1 and get the person to the nearest hospital. Otherwise, call your doctor.  Reactions should be reported to the Vaccine Adverse  Event Reporting System (VAERS). Your doctor should file this report, or you can do it yourself through the VAERS web site at www.vaers.SamedayNews.es, or by calling 985-321-8343. ? VAERS does not give medical advice. 6. The National Vaccine Injury Compensation Program The [employer] Vaccine Injury Compensation Program (VICP) is a federal program that was created to compensate people who may have been injured by certain vaccines. Persons who believe they may have been injured by a vaccine can learn about the program and about filing a claim by calling 613-477-7420 or visiting the [place] website at GoldCloset.com.ee. There is a time limit to file a claim for compensation. 7. How can I learn more?  Ask your healthcare provider. He or she can give you the vaccine package insert or suggest other sources of information.  Call your local or state health department.  Contact the Centers for Disease Control and Prevention (CDC): ? Call (906)462-7865 (1-800-CDC-INFO) or ? Visit CDC's website at https://gibson.com/ Vaccine Information Statement, Inactivated Influenza Vaccine (09/05/2013) This information is not intended  to replace advice given to you by your health care provider. Make sure you discuss any questions you have with your health care provider. Document Released: 11/10/2005 Document Revised: 10/07/2015 Document Reviewed: 10/07/2015 Elsevier Interactive Patient Education  2017 [place].  Back Exercises If you have pain in your back, do these exercises 2-3 times each day or as told by your doctor. When the pain goes away, do the exercises once each day, but repeat the steps more times for each exercise (do more repetitions). If you do not have pain in your back, do these exercises once each day or as told by your doctor. Exercises Single Knee to Chest  Do these steps 3-5 times in a row for each leg: 1. Lie on your back on a firm bed or the floor with your legs stretched  out. 2. Bring one knee to your chest. 3. Hold your knee to your chest by grabbing your knee or thigh. 4. Pull on your knee until you feel a gentle stretch in your lower back. 5. Keep doing the stretch for 10-30 seconds. 6. Slowly let go of your leg and straighten it.  Pelvic Tilt  Do these steps 5-10 times in a row: 1. Lie on your back on a firm bed or the floor with your legs stretched out. 2. Bend your knees so they point up to the ceiling. Your feet should be flat on the floor. 3. Tighten your lower belly (abdomen) muscles to press your lower back against the floor. This will make your tailbone point up to the ceiling instead of pointing down to your feet or the floor. 4. Stay in this position for 5-10 seconds while you gently tighten your muscles and breathe evenly.  Cat-Cow  Do these steps until your lower back bends more easily: 1. Get on your hands and knees on a firm surface. Keep your hands under your shoulders, and keep your knees under your hips. You may put padding under your knees. 2. Let your head hang down, and make your tailbone point down to the floor so your lower back is round like the back of a cat. 3. Stay in this position for 5 seconds. 4. Slowly lift your head and make your tailbone point up to the ceiling so your back hangs low (sags) like the back of a cow. 5. Stay in this position for 5 seconds.  Press-Ups  Do these steps 5-10 times in a row: 1. Lie on your belly (face-down) on the floor. 2. Place your hands near your head, about shoulder-width apart. 3. While you keep your back relaxed and keep your hips on the floor, slowly straighten your arms to raise the top half of your body and lift your shoulders. Do not use your back muscles. To make yourself more comfortable, you may change where you place your hands. 4. Stay in this position for 5 seconds. 5. Slowly return to lying flat on the floor.  Bridges  Do these steps 10 times in a row: 1. Lie on your  back on a firm surface. 2. Bend your knees so they point up to the ceiling. Your feet should be flat on the floor. 3. Tighten your butt muscles and lift your butt off of the floor until your waist is almost as high as your knees. If you do not feel the muscles working in your butt and the back of your thighs, slide your feet 1-2 inches farther away from your butt. 4. Stay in this position  for 3-5 seconds. 5. Slowly lower your butt to the floor, and let your butt muscles relax.  If this exercise is too easy, try doing it with your arms crossed over your chest. Belly Crunches  Do these steps 5-10 times in a row: 1. Lie on your back on a firm bed or the floor with your legs stretched out. 2. Bend your knees so they point up to the ceiling. Your feet should be flat on the floor. 3. Cross your arms over your chest. 4. Tip your chin a little bit toward your chest but do not bend your neck. 5. Tighten your belly muscles and slowly raise your chest just enough to lift your shoulder blades a tiny bit off of the floor. 6. Slowly lower your chest and your head to the floor.  Back Lifts Do these steps 5-10 times in a row: 1. Lie on your belly (face-down) with your arms at your sides, and rest your forehead on the floor. 2. Tighten the muscles in your legs and your butt. 3. Slowly lift your chest off of the floor while you keep your hips on the floor. Keep the back of your head in line with the curve in your back. Look at the floor while you do this. 4. Stay in this position for 3-5 seconds. 5. Slowly lower your chest and your face to the floor.  Contact a doctor if:  Your back pain gets a lot worse when you do an exercise.  Your back pain does not lessen 2 hours after you exercise. If you have any of these problems, stop doing the exercises. Do not do them again unless your doctor says it is okay. Get help right away if:  You have sudden, very bad back pain. If this happens, stop doing the  exercises. Do not do them again unless your doctor says it is okay. This information is not intended to replace advice given to you by your health care provider. Make sure you discuss any questions you have with your health care provider. Document Released: 02/18/2010 Document Revised: 06/24/2015 Document Reviewed: 03/12/2014 Elsevier Interactive Patient Education  [employer].

## 2016-11-16 NOTE — Progress Notes (Signed)
BP 123/73 (BP Location: Left Arm, Patient Position: Sitting, Cuff Size: Normal)   Pulse 81   Temp 98.4 F (36.9 C)   Wt 151 lb 7 oz (68.7 kg)   SpO2 100%   BMI 23.03 kg/m    Subjective:    Patient ID: Mary Hoover, female    DOB: Apr 19, 1945, 71 y.o.   MRN: [id number]  HPI: Mary Hoover is a 71 y.o. female  Chief Complaint  Patient presents with  . Dizziness   DIZZINESS- saw vascular. Doing well. No need for any intervention. Saw ENT- thought to be vertigo. Was to have VNG- but she didn't want to do that, so she didn't go. Hasn't had a spell in about 6 weeks. Has meclizine in case she needs it.   Had her colonoscopy. Doing well.   Bottom part of her back has been hurting for a couple of days. No trauma. No radiation. No numbness or tingling. Better with heat and tylenol. Worse with movements- especially going from sitting to standing. More of a sore pain, moderate. Pain is there all the time, gets better and worse.    Relevant past medical, surgical, family and social history reviewed and updated as indicated. Interim medical history since our last visit reviewed. Allergies and medications reviewed and updated.  Review of Systems  Constitutional: Negative.   Respiratory: Negative.   Cardiovascular: Negative.   Musculoskeletal: Positive for back pain and myalgias. Negative for arthralgias, gait problem, joint swelling, neck pain and neck stiffness.  Neurological: Negative.   Psychiatric/Behavioral: Negative.     Per HPI unless specifically indicated above     Objective:    BP 123/73 (BP Location: Left Arm, Patient Position: Sitting, Cuff Size: Normal)   Pulse 81   Temp 98.4 F (36.9 C)   Wt 151 lb 7 oz (68.7 kg)   SpO2 100%   BMI 23.03 kg/m   Wt Readings from Last 3 Encounters:  11/16/16 151 lb 7 oz (68.7 kg)  11/14/16 148 lb 12.8 oz (67.5 kg)  10/31/16 148 lb 9.6 oz (67.4 kg)    Physical Exam  Constitutional: She is oriented to person, place, and  time. She appears well-developed and well-nourished. No distress.  HENT:  Head: Normocephalic and atraumatic.  Right Ear: Hearing normal.  Left Ear: Hearing normal.  Nose: Nose normal.  Eyes: Conjunctivae and lids are normal. Right eye exhibits no discharge. Left eye exhibits no discharge. No scleral icterus.  Cardiovascular: Normal rate, regular rhythm, normal heart sounds and intact distal pulses.  Exam reveals no gallop and no friction rub.   No murmur heard. Pulmonary/Chest: Effort normal and breath sounds normal. No respiratory distress. She has no wheezes. She has no rales. She exhibits no tenderness.  Musculoskeletal: She exhibits tenderness (R lower back).  Neurological: She is alert and oriented to person, place, and time.  Skin: Skin is warm, dry and intact. No rash noted. She is not diaphoretic. No erythema. No pallor.  Psychiatric: She has a normal mood and affect. Her speech is normal and behavior is normal. Judgment and thought content normal. Cognition and memory are normal.  Nursing note and vitals reviewed.   Results for orders placed or performed in visit on 11/14/16  VAS US CAROTID  Result Value Ref Range   Right CCA prox sys 110 cm/s   Right CCA prox dias 29 cm/s   Right cca dist sys -172 cm/s   Left CCA prox sys 122 cm/s   Left CCA  prox dias 30 cm/s   Left CCA dist sys 122 cm/s   Left CCA dist dias 41 cm/s   Left ICA prox sys -149 cm/s   Left ICA prox dias -48 cm/s   Left ICA dist sys -123 cm/s   Left ICA dist dias -48 cm/s   RIGHT CCA MID DIAS 30.00 cm/s   RIGHT ECA DIAS -10.00 cm/s   RIGHT VERTEBRAL DIAS 24.00 cm/s   LEFT ECA DIAS -14.00 cm/s   LEFT VERTEBRAL DIAS 14.00 cm/s      Assessment & Plan:   Problem List Items Addressed This Visit    None    Visit Diagnoses    Acute right-sided low back pain without sciatica    -  Primary   Will start lidocaine patches. Call with any concerns or if not getting better.    Dizziness       Resolved    Immunization due       Flu shot given today.   Relevant Orders   Flu vaccine HIGH DOSE PF (Fluzone High dose) (Completed)       Follow up plan: Return February , for Wellness.

## 2016-11-21 ENCOUNTER — Encounter: Payer: Self-pay | Admitting: Gastroenterology

## 2016-12-07 ENCOUNTER — Other Ambulatory Visit: Payer: Self-pay | Admitting: Family Medicine

## 2016-12-07 ENCOUNTER — Other Ambulatory Visit: Payer: Self-pay

## 2016-12-07 MED ORDER — PANTOPRAZOLE SODIUM 20 MG PO TBEC: 20.0000 mg | DELAYED_RELEASE_TABLET | Freq: Every day | ORAL | 1 refills | Status: DC

## 2016-12-07 NOTE — Telephone Encounter (Signed)
Patient last seen 11/16/16 and has f/up 03/23/17.

## 2017-03-23 ENCOUNTER — Ambulatory Visit: Payer: Medicare Other | Admitting: Family Medicine

## 2017-03-26 ENCOUNTER — Other Ambulatory Visit (INDEPENDENT_AMBULATORY_CARE_PROVIDER_SITE_OTHER): Payer: Self-pay | Admitting: Vascular Surgery

## 2017-03-26 ENCOUNTER — Other Ambulatory Visit: Payer: Self-pay | Admitting: Family Medicine

## 2017-04-09 ENCOUNTER — Encounter: Payer: Self-pay | Admitting: Family Medicine

## 2017-04-09 ENCOUNTER — Ambulatory Visit (INDEPENDENT_AMBULATORY_CARE_PROVIDER_SITE_OTHER): Payer: Medicare Other

## 2017-04-09 ENCOUNTER — Ambulatory Visit (INDEPENDENT_AMBULATORY_CARE_PROVIDER_SITE_OTHER): Payer: Medicare Other | Admitting: Family Medicine

## 2017-04-09 VITALS — BP 130/73 | HR 81 | Ht 68.0 in | Wt 148.0 lb

## 2017-04-09 VITALS — BP 130/73 | HR 81 | Temp 98.0°F | Resp 17 | Ht 68.0 in | Wt 148.0 lb

## 2017-04-09 DIAGNOSIS — E034 Atrophy of thyroid (acquired): Secondary | ICD-10-CM

## 2017-04-09 DIAGNOSIS — Z720 Tobacco use: Secondary | ICD-10-CM | POA: Diagnosis not present

## 2017-04-09 DIAGNOSIS — I6523 Occlusion and stenosis of bilateral carotid arteries: Secondary | ICD-10-CM | POA: Diagnosis not present

## 2017-04-09 DIAGNOSIS — J449 Chronic obstructive pulmonary disease, unspecified: Secondary | ICD-10-CM

## 2017-04-09 DIAGNOSIS — E782 Mixed hyperlipidemia: Secondary | ICD-10-CM

## 2017-04-09 DIAGNOSIS — I10 Essential (primary) hypertension: Secondary | ICD-10-CM | POA: Diagnosis not present

## 2017-04-09 DIAGNOSIS — Z1159 Encounter for screening for other viral diseases: Secondary | ICD-10-CM | POA: Diagnosis not present

## 2017-04-09 DIAGNOSIS — Z0001 Encounter for general adult medical examination with abnormal findings: Secondary | ICD-10-CM

## 2017-04-09 DIAGNOSIS — Z0000 Encounter for general adult medical examination without abnormal findings: Secondary | ICD-10-CM

## 2017-04-09 DIAGNOSIS — I739 Peripheral vascular disease, unspecified: Secondary | ICD-10-CM

## 2017-04-09 LAB — MICROSCOPIC EXAMINATION: Bacteria, UA: NONE SEEN

## 2017-04-09 LAB — UA/M W/RFLX CULTURE, ROUTINE
Bilirubin, UA: NEGATIVE
GLUCOSE, UA: NEGATIVE
Ketones, UA: NEGATIVE
Nitrite, UA: NEGATIVE
PH UA: 6 (ref 5.0–7.5)
Protein, UA: NEGATIVE
Specific Gravity, UA: <1.005 — ABNORMAL LOW (ref 1.005–1.030)
UUROB: 0.2 mg/dL (ref 0.2–1.0)

## 2017-04-09 LAB — MICROALBUMIN, URINE WAIVED
Creatinine, Urine Waived: 50 mg/dL (ref 10–300)
Microalb, Ur Waived: 10 mg/L (ref 0–19)

## 2017-04-09 MED ORDER — PANTOPRAZOLE SODIUM 20 MG PO TBEC: 20.0000 mg | DELAYED_RELEASE_TABLET | Freq: Every day | ORAL | 1 refills | Status: DC

## 2017-04-09 MED ORDER — BUDESONIDE-FORMOTEROL FUMARATE 160-4.5 MCG/ACT IN AERO: 2.0000 | INHALATION_SPRAY | Freq: Two times a day (BID) | RESPIRATORY_TRACT | 12 refills | Status: DC

## 2017-04-09 MED ORDER — LISINOPRIL 20 MG PO TABS: 20.0000 mg | ORAL_TABLET | Freq: Every day | ORAL | 1 refills | Status: DC

## 2017-04-09 MED ORDER — ATORVASTATIN CALCIUM 20 MG PO TABS: 20.0000 mg | ORAL_TABLET | Freq: Every day | ORAL | 1 refills | Status: DC

## 2017-04-09 MED ORDER — FERROUS SULFATE 325 (65 FE) MG PO TBEC: 325.0000 mg | DELAYED_RELEASE_TABLET | Freq: Every day | ORAL | 4 refills | Status: DC

## 2017-04-09 MED ORDER — CLOPIDOGREL BISULFATE 75 MG PO TABS: 75.0000 mg | ORAL_TABLET | Freq: Every day | ORAL | 3 refills | Status: DC

## 2017-04-09 NOTE — Patient Instructions (Addendum)
Health Maintenance for Postmenopausal Women Menopause is a normal process in which your reproductive ability comes to an end. This process happens gradually over a span of months to years, usually between the ages of 22 and 9. Menopause is complete when you have missed 12 consecutive menstrual periods. It is important to talk with your health care provider about some of the most common conditions that affect postmenopausal women, such as heart disease, cancer, and bone loss (osteoporosis). Adopting a healthy lifestyle and getting preventive care can help to promote your health and wellness. Those actions can also lower your chances of developing some of these common conditions. What should I know about menopause? During menopause, you may experience a number of symptoms, such as:  Moderate-to-severe hot flashes.  Night sweats.  Decrease in sex drive.  Mood swings.  Headaches.  Tiredness.  Irritability.  Memory problems.  Insomnia.  Choosing to treat or not to treat menopausal changes is an individual decision that you make with your health care provider. What should I know about hormone replacement therapy and supplements? Hormone therapy products are effective for treating symptoms that are associated with menopause, such as hot flashes and night sweats. Hormone replacement carries certain risks, especially as you become older. If you are thinking about using estrogen or estrogen with progestin treatments, discuss the benefits and risks with your health care provider. What should I know about heart disease and stroke? Heart disease, heart attack, and stroke become more likely as you age. This may be due, in part, to the hormonal changes that your body experiences during menopause. These can affect how your body processes dietary fats, triglycerides, and cholesterol. Heart attack and stroke are both medical emergencies. There are many things that you can do to help prevent heart disease  and stroke:  Have your blood pressure checked at least every 1-2 years. High blood pressure causes heart disease and increases the risk of stroke.  If you are 53-22 years old, ask your health care provider if you should take aspirin to prevent a heart attack or a stroke.  Do not use any tobacco products, including cigarettes, chewing tobacco, or electronic cigarettes. If you need help quitting, ask your health care provider.  It is important to eat a healthy diet and maintain a healthy weight. ? Be sure to include plenty of vegetables, fruits, low-fat dairy products, and lean protein. ? Avoid eating foods that are high in solid fats, added sugars, or salt (sodium).  Get regular exercise. This is one of the most important things that you can do for your health. ? Try to exercise for at least 150 minutes each week. The type of exercise that you do should increase your heart rate and make you sweat. This is known as moderate-intensity exercise. ? Try to do strengthening exercises at least twice each week. Do these in addition to the moderate-intensity exercise.  Know your numbers.Ask your health care provider to check your cholesterol and your blood glucose. Continue to have your blood tested as directed by your health care provider.  What should I know about cancer screening? There are several types of cancer. Take the following steps to reduce your risk and to catch any cancer development as early as possible. Breast Cancer  Practice breast self-awareness. ? This means understanding how your breasts normally appear and feel. ? It also means doing regular breast self-exams. Let your health care provider know about any changes, no matter how small.  If you are 40  or older, have a clinician do a breast exam (clinical breast exam or CBE) every year. Depending on your age, family history, and medical history, it may be recommended that you also have a yearly breast X-ray (mammogram).  If you  have a family history of breast cancer, talk with your health care provider about genetic screening.  If you are at high risk for breast cancer, talk with your health care provider about having an MRI and a mammogram every year.  Breast cancer (BRCA) gene test is recommended for women who have family members with BRCA-related cancers. Results of the assessment will determine the need for genetic counseling and BRCA1 and for BRCA2 testing. BRCA-related cancers include these types: ? Breast. This occurs in males or females. ? Ovarian. ? Tubal. This may also be called fallopian tube cancer. ? Cancer of the abdominal or pelvic lining (peritoneal cancer). ? Prostate. ? Pancreatic.  Cervical, Uterine, and Ovarian Cancer Your health care provider may recommend that you be screened regularly for cancer of the pelvic organs. These include your ovaries, uterus, and vagina. This screening involves a pelvic exam, which includes checking for microscopic changes to the surface of your cervix (Pap test).  For women ages 21-65, health care providers may recommend a pelvic exam and a Pap test every three years. For women ages 79-65, they may recommend the Pap test and pelvic exam, combined with testing for human papilloma virus (HPV), every five years. Some types of HPV increase your risk of cervical cancer. Testing for HPV may also be done on women of any age who have unclear Pap test results.  Other health care providers may not recommend any screening for nonpregnant women who are considered low risk for pelvic cancer and have no symptoms. Ask your health care provider if a screening pelvic exam is right for you.  If you have had past treatment for cervical cancer or a condition that could lead to cancer, you need Pap tests and screening for cancer for at least 20 years after your treatment. If Pap tests have been discontinued for you, your risk factors (such as having a new sexual partner) need to be  reassessed to determine if you should start having screenings again. Some women have medical problems that increase the chance of getting cervical cancer. In these cases, your health care provider may recommend that you have screening and Pap tests more often.  If you have a family history of uterine cancer or ovarian cancer, talk with your health care provider about genetic screening.  If you have vaginal bleeding after reaching menopause, tell your health care provider.  There are currently no reliable tests available to screen for ovarian cancer.  Lung Cancer Lung cancer screening is recommended for adults 69-62 years old who are at high risk for lung cancer because of a history of smoking. A yearly low-dose CT scan of the lungs is recommended if you:  Currently smoke.  Have a history of at least 30 pack-years of smoking and you currently smoke or have quit within the past 15 years. A pack-year is smoking an average of one pack of cigarettes per day for one year.  Yearly screening should:  Continue until it has been 15 years since you quit.  Stop if you develop a health problem that would prevent you from having lung cancer treatment.  Colorectal Cancer  This type of cancer can be detected and can often be prevented.  Routine colorectal cancer screening usually begins at  age 42 and continues through age 45.  If you have risk factors for colon cancer, your health care provider may recommend that you be screened at an earlier age.  If you have a family history of colorectal cancer, talk with your health care provider about genetic screening.  Your health care provider may also recommend using home test kits to check for hidden blood in your stool.  A small camera at the end of a tube can be used to examine your colon directly (sigmoidoscopy or colonoscopy). This is done to check for the earliest forms of colorectal cancer.  Direct examination of the colon should be repeated every  5-10 years until age 71. However, if early forms of precancerous polyps or small growths are found or if you have a family history or genetic risk for colorectal cancer, you may need to be screened more often.  Skin Cancer  Check your skin from head to toe regularly.  Monitor any moles. Be sure to tell your health care provider: ? About any new moles or changes in moles, especially if there is a change in a mole's shape or color. ? If you have a mole that is larger than the size of a pencil eraser.  If any of your family members has a history of skin cancer, especially at a young age, talk with your health care provider about genetic screening.  Always use sunscreen. Apply sunscreen liberally and repeatedly throughout the day.  Whenever you are outside, protect yourself by wearing long sleeves, pants, a wide-brimmed hat, and sunglasses.  What should I know about osteoporosis? Osteoporosis is a condition in which bone destruction happens more quickly than new bone creation. After menopause, you may be at an increased risk for osteoporosis. To help prevent osteoporosis or the bone fractures that can happen because of osteoporosis, the following is recommended:  If you are 46-71 years old, get at least 1,000 mg of calcium and at least 600 mg of vitamin D per day.  If you are older than age 55 but younger than age 65, get at least 1,200 mg of calcium and at least 600 mg of vitamin D per day.  If you are older than age 54, get at least 1,200 mg of calcium and at least 800 mg of vitamin D per day.  Smoking and excessive alcohol intake increase the risk of osteoporosis. Eat foods that are rich in calcium and vitamin D, and do weight-bearing exercises several times each week as directed by your health care provider. What should I know about how menopause affects my mental health? Depression may occur at any age, but it is more common as you become older. Common symptoms of depression  include:  Low or sad mood.  Changes in sleep patterns.  Changes in appetite or eating patterns.  Feeling an overall lack of motivation or enjoyment of activities that you previously enjoyed.  Frequent crying spells.  Talk with your health care provider if you think that you are experiencing depression. What should I know about immunizations? It is important that you get and maintain your immunizations. These include:  Tetanus, diphtheria, and pertussis (Tdap) booster vaccine.  Influenza every year before the flu season begins.  Pneumonia vaccine.  Shingles vaccine.  Your health care provider may also recommend other immunizations. This information is not intended to replace advice given to you by your health care provider. Make sure you discuss any questions you have with your health care provider. Document Released: 03/10/2005  Document Revised: 08/06/2015 Document Reviewed: 10/20/2014 Elsevier Interactive Patient Education  2018 [employer]  

## 2017-04-09 NOTE — Assessment & Plan Note (Signed)
Not interested in quitting right now. Continue to monitor. Call with any concerns.

## 2017-04-09 NOTE — Assessment & Plan Note (Signed)
Rechecking levels today. Await results. Adjust dose as needed.

## 2017-04-09 NOTE — Assessment & Plan Note (Signed)
Under good control on current regimen. Will keep BP and cholesterol under good control. Continue to monitor. Call with any concerns.

## 2017-04-09 NOTE — Assessment & Plan Note (Signed)
Under good control on current regimen. Continue current regimen. Continue to monitor. Call with any concerns.   

## 2017-04-09 NOTE — Patient Instructions (Signed)
Mary Hoover , Thank you for taking time to come for your Medicare Wellness Visit. I appreciate your ongoing commitment to your health goals. Please review the following plan we discussed and let me know if I can assist you in the future.   Screening recommendations/referrals: Colonoscopy: completed 11/08/2016 Mammogram: last completed 05/22/2014- declined Bone Density: declined Recommended yearly ophthalmology/optometry visit for glaucoma screening and checkup Recommended yearly dental visit for hygiene and checkup  Vaccinations: Influenza vaccine: up to date Pneumococcal vaccine: up to date  Tdap vaccine: up to date Shingles vaccine: eligible, check with your insurance company for coverage   Advanced directives: Advance directive discussed with you today. Even though you declined this today please call our office should you change your mind and we can give you the proper paperwork for you to fill out.  Conditions/risks identified: smoking cessation discussed- declined lung cancer screening.   Next appointment: Follow up in one year for your annual wellness exam.    Preventive Care 65 Years and Older, Female Preventive care refers to lifestyle choices and visits with your health care provider that can promote health and wellness. What does preventive care include?  A yearly physical exam. This is also called an annual well check.  Dental exams once or twice a year.  Routine eye exams. Ask your health care provider how often you should have your eyes checked.  Personal lifestyle choices, including:  Daily care of your teeth and gums.  Regular physical activity.  Eating a healthy diet.  Avoiding tobacco and drug use.  Limiting alcohol use.  Practicing safe sex.  Taking low-dose aspirin every day.  Taking vitamin and mineral supplements as recommended by your health care provider. What happens during an annual well check? The services and screenings done by your  health care provider during your annual well check will depend on your age, overall health, lifestyle risk factors, and family history of disease. Counseling  Your health care provider may ask you questions about your:  Alcohol use.  Tobacco use.  Drug use.  Emotional well-being.  Home and relationship well-being.  Sexual activity.  Eating habits.  History of falls.  Memory and ability to understand (cognition).  Work and work Statistician.  Reproductive health. Screening  You may have the following tests or measurements:  Height, weight, and BMI.  Blood pressure.  Lipid and cholesterol levels. These may be checked every 5 years, or more frequently if you are over 40 years old.  Skin check.  Lung cancer screening. You may have this screening every year starting at age 37 if you have a 30-pack-year history of smoking and currently smoke or have quit within the past 15 years.  Fecal occult blood test (FOBT) of the stool. You may have this test every year starting at age 8.  Flexible sigmoidoscopy or colonoscopy. You may have a sigmoidoscopy every 5 years or a colonoscopy every 10 years starting at age 46.  Hepatitis C blood test.  Hepatitis B blood test.  Sexually transmitted disease (STD) testing.  Diabetes screening. This is done by checking your blood sugar (glucose) after you have not eaten for a while (fasting). You may have this done every 1-3 years.  Bone density scan. This is done to screen for osteoporosis. You may have this done starting at age 62.  Mammogram. This may be done every 1-2 years. Talk to your health care provider about how often you should have regular mammograms. Talk with your health care provider about your  test results, treatment options, and if necessary, the need for more tests. Vaccines  Your health care provider may recommend certain vaccines, such as:  Influenza vaccine. This is recommended every year.  Tetanus, diphtheria, and  acellular pertussis (Tdap, Td) vaccine. You may need a Td booster every 10 years.  Zoster vaccine. You may need this after age 79.  Pneumococcal 13-valent conjugate (PCV13) vaccine. One dose is recommended after age 57.  Pneumococcal polysaccharide (PPSV23) vaccine. One dose is recommended after age 52. Talk to your health care provider about which screenings and vaccines you need and how often you need them. This information is not intended to replace advice given to you by your health care provider. Make sure you discuss any questions you have with your health care provider. Document Released: 02/12/2015 Document Revised: 10/06/2015 Document Reviewed: 11/17/2014 Elsevier Interactive Patient Education  2017 [place] Prevention in the Home Falls can cause injuries. They can happen to people of all ages. There are many things you can do to make your home safe and to help prevent falls. What can I do on the outside of my home?  Regularly fix the edges of walkways and driveways and fix any cracks.  Remove anything that might make you trip as you walk through a door, such as a raised step or threshold.  Trim any bushes or trees on the path to your home.  Use bright outdoor lighting.  Clear any walking paths of anything that might make someone trip, such as rocks or tools.  Regularly check to see if handrails are loose or broken. Make sure that both sides of any steps have handrails.  Any raised decks and porches should have guardrails on the edges.  Have any leaves, snow, or ice cleared regularly.  Use sand or salt on walking paths during winter.  Clean up any spills in your garage right away. This includes oil or grease spills. What can I do in the bathroom?  Use night lights.  Install grab bars by the toilet and in the tub and shower. Do not use towel bars as grab bars.  Use non-skid mats or decals in the tub or shower.  If you need to sit down in the shower, use  a plastic, non-slip stool.  Keep the floor dry. Clean up any water that spills on the floor as soon as it happens.  Remove soap buildup in the tub or shower regularly.  Attach bath mats securely with double-sided non-slip rug tape.  Do not have throw rugs and other things on the floor that can make you trip. What can I do in the bedroom?  Use night lights.  Make sure that you have a light by your bed that is easy to reach.  Do not use any sheets or blankets that are too big for your bed. They should not hang down onto the floor.  Have a firm chair that has side arms. You can use this for support while you get dressed.  Do not have throw rugs and other things on the floor that can make you trip. What can I do in the kitchen?  Clean up any spills right away.  Avoid walking on wet floors.  Keep items that you use a lot in easy-to-reach places.  If you need to reach something above you, use a strong step stool that has a grab bar.  Keep electrical cords out of the way.  Do not use floor polish or wax that  makes floors slippery. If you must use wax, use non-skid floor wax.  Do not have throw rugs and other things on the floor that can make you trip. What can I do with my stairs?  Do not leave any items on the stairs.  Make sure that there are handrails on both sides of the stairs and use them. Fix handrails that are broken or loose. Make sure that handrails are as long as the stairways.  Check any carpeting to make sure that it is firmly attached to the stairs. Fix any carpet that is loose or worn.  Avoid having throw rugs at the top or bottom of the stairs. If you do have throw rugs, attach them to the floor with carpet tape.  Make sure that you have a light switch at the top of the stairs and the bottom of the stairs. If you do not have them, ask someone to add them for you. What else can I do to help prevent falls?  Wear shoes that:  Do not have high heels.  Have  rubber bottoms.  Are comfortable and fit you well.  Are closed at the toe. Do not wear sandals.  If you use a stepladder:  Make sure that it is fully opened. Do not climb a closed stepladder.  Make sure that both sides of the stepladder are locked into place.  Ask someone to hold it for you, if possible.  Clearly mark and make sure that you can see:  Any grab bars or handrails.  First and last steps.  Where the edge of each step is.  Use tools that help you move around (mobility aids) if they are needed. These include:  Canes.  Walkers.  Scooters.  Crutches.  Turn on the lights when you go into a dark area. Replace any light bulbs as soon as they burn out.  Set up your furniture so you have a clear path. Avoid moving your furniture around.  If any of your floors are uneven, fix them.  If there are any pets around you, be aware of where they are.  Review your medicines with your doctor. Some medicines can make you feel dizzy. This can increase your chance of falling. Ask your doctor what other things that you can do to help prevent falls. This information is not intended to replace advice given to you by your health care provider. Make sure you discuss any questions you have with your health care provider. Document Released: 11/12/2008 Document Revised: 06/24/2015 Document Reviewed: 02/20/2014 Elsevier Interactive Patient Education  2017 [employer].

## 2017-04-09 NOTE — Progress Notes (Signed)
BP 130/73   Pulse 81   Ht _0  (1.727 m)   Wt 148 lb (67.1 kg)   SpO2 99%   BMI 22.50 kg/m    Subjective:    Patient ID: Mary Hoover, female    DOB: August 19, 1945, 72 y.o.   MRN: [id number]  HPI: Mary Hoover is a 72 y.o. female presenting on 04/09/2017 for comprehensive medical examination. Current medical complaints include:  HYPERTENSION / HYPERLIPIDEMIA Satisfied with current treatment? yes Duration of hypertension: chronic BP monitoring frequency: not checking BP medication side effects: no Past BP meds: lisinopril Duration of hyperlipidemia: chronic Cholesterol medication side effects: no Cholesterol supplements: none Past cholesterol medications: atorvastain (lipitor) Medication compliance: excellent compliance Aspirin: no Recent stressors: no Recurrent headaches: no Visual changes: no Palpitations: no Dyspnea: no Chest pain: no Lower extremity edema: no Dizzy/lightheaded: no  COPD COPD status: controlled Satisfied with current treatment?: yes Oxygen use: no Dyspnea frequency: rarely Cough frequency: occasionally Rescue inhaler frequency:  occasionally Limitation of activity: no Pneumovax: Up to Date Influenza: Up to Date  HYPOTHYROIDISM Thyroid control status:controlled Satisfied with current treatment? yes Medication side effects: yes Medication compliance: excellent compliance Recent dose adjustment:no Fatigue: no Cold intolerance: no Heat intolerance: no Weight gain: no Weight loss: no Constipation: no Diarrhea/loose stools: no Palpitations: no Lower extremity edema: no Anxiety/depressed mood: no  Menopausal Symptoms: no  Depression Screen done today and results listed below:  Depression screen [facility] 2/9 04/09/2017 01/11/2016 07/13/2015  Decreased Interest 0 0 0  Down, Depressed, Hopeless 0 0 0  PHQ - 2 Score 0 0 0   Past Medical History:  Past Medical History:  Diagnosis Date  . Carotid atherosclerosis   . Carotid bruit     . COPD (chronic obstructive pulmonary disease) (HCC)    emphysema  . Hyperlipidemia   . Hypertension   . Hypothyroidism   . Infected cat bite 1980s   was hospitalized  . Tobacco use     Surgical History:  Past Surgical History:  Procedure Laterality Date  . CAROTID ENDARTERECTOMY Right Oct. 2015   Dr. Lucky Cowboy  . CAROTID STENOSIS Left April 2016   carotid stenosis surgery  . COLONOSCOPY WITH PROPOFOL N/A 11/08/2016   Procedure: COLONOSCOPY WITH PROPOFOL;  Surgeon: Jonathon Bellows, MD;  Location: [facility] ENDOSCOPY;  Service: Gastroenterology;  Laterality: N/A;  . ESOPHAGOGASTRODUODENOSCOPY (EGD) WITH PROPOFOL N/A 11/08/2016   Procedure: ESOPHAGOGASTRODUODENOSCOPY (EGD) WITH PROPOFOL;  Surgeon: Jonathon Bellows, MD;  Location: [facility] ENDOSCOPY;  Service: Gastroenterology;  Laterality: N/A;  . FLEXIBLE BRONCHOSCOPY N/A 12/17/2014   Procedure: FLEXIBLE BRONCHOSCOPY;  Surgeon: Wilhelmina Mcardle, MD;  Location: [facility] ORS;  Service: Pulmonary;  Laterality: N/A;  . INCISION AND DRAINAGE / EXCISION THYROGLOSSAL CYST  April 2011    Medications:  Current Outpatient Medications on File Prior to Visit  Medication Sig  . levothyroxine (SYNTHROID, LEVOTHROID) 75 MCG tablet Take 1 tablet (75 mcg total) by mouth daily.  . meclizine (ANTIVERT) 25 MG tablet Take 1 tablet (25 mg total) by mouth 3 (three) times daily as needed for dizziness or nausea. (Patient not taking: Reported on 04/09/2017)  . vitamin B-12 (CYANOCOBALAMIN) 500 MCG tablet Take 500 mcg by mouth daily.   No current facility-administered medications on file prior to visit.     Allergies:  No Known Allergies  Social History:  Social History   Socioeconomic History  . Marital status: Married    Spouse name: Not on file  . Number of children: Not on file  .  Years of education: Not on file  . Highest education level: Not on file  Social Needs  . Financial resource strain: Not hard at all  . Food insecurity - worry: Never true  . Food  insecurity - inability: Never true  . Transportation needs - medical: No  . Transportation needs - non-medical: No  Occupational History  . Not on file  Tobacco Use  . Smoking status: Current Every Day Smoker    Packs/day: 0.50    Years: 50.00    Pack years: 25.00    Types: Cigarettes  . Smokeless tobacco: Never Used  Substance and Sexual Activity  . Alcohol use: No    Alcohol/week: 0.0 oz    Comment: occasional  . Drug use: No  . Sexual activity: Yes  Other Topics Concern  . Not on file  Social History Narrative   Lives at home with husband   Social History   Tobacco Use  Smoking Status Current Every Day Smoker  . Packs/day: 0.50  . Years: 50.00  . Pack years: 25.00  . Types: Cigarettes  Smokeless Tobacco Never Used   Social History   Substance and Sexual Activity  Alcohol Use No  . Alcohol/week: 0.0 oz   Comment: occasional    Family History:  Family History  Problem Relation Age of Onset  . Congestive Heart Failure Mother   . Heart disease Mother   . Hypertension Mother   . Hypertension Sister   . Diabetes Sister   . Heart disease Sister   . Cancer Brother        liver, lung  . Heart disease Maternal Grandmother   . Heart disease Maternal Uncle   . Stroke Maternal Grandfather   . Heart disease Brother   . Hypertension Brother   . COPD Neg Hx     Past medical history, surgical history, medications, allergies, family history and social history reviewed with patient today and changes made to appropriate areas of the chart.   Review of Systems  Constitutional: Negative.   HENT: Positive for hearing loss. Negative for congestion, ear discharge, ear pain, nosebleeds, sinus pain, sore throat and tinnitus.   Eyes: Negative.   Respiratory: Negative.  Negative for stridor.   Cardiovascular: Negative.   Gastrointestinal: Negative.   Genitourinary: Negative.   Musculoskeletal: Negative.   Skin: Negative.   Neurological: Negative.   Endo/Heme/Allergies:  Negative for environmental allergies and polydipsia. Bruises/bleeds easily.  Psychiatric/Behavioral: Negative for depression, hallucinations, memory loss, substance abuse and suicidal ideas. The patient has insomnia. The patient is not nervous/anxious.     All other ROS negative except what is listed above and in the HPI.      Objective:    BP 130/73   Pulse 81   Ht _0  (1.727 m)   Wt 148 lb (67.1 kg)   SpO2 99%   BMI 22.50 kg/m   Wt Readings from Last 3 Encounters:  04/09/17 148 lb (67.1 kg)  04/09/17 148 lb (67.1 kg)  11/16/16 151 lb 7 oz (68.7 kg)    Physical Exam  Results for orders placed or performed during the hospital encounter of 11/08/16  Surgical pathology  Result Value Ref Range   SURGICAL PATHOLOGY      Surgical Pathology CASE: [id number] PATIENT: Remo Lipps [facility] Surgical Pathology Report     SPECIMEN SUBMITTED: A. Duodenum, rule out celiac disease; cbx B. Colon polyp, ascending; cbx  CLINICAL HISTORY: None provided  PRE-OPERATIVE DIAGNOSIS: Normocytic anemia D64.9  POST-OPERATIVE DIAGNOSIS: Normal  upper endoscopy; colon polyps; diverticulosis     DIAGNOSIS: A. DUODENUM; COLD BIOPSY: - INTACT VILLOUS ARCHITECTURE. - FOCAL ACTIVE DUODENITIS SUGGESTIVE OF PEPTIC OR NSAID INJURY. - NEGATIVE FOR INTRAEPITHELIAL LYMPHOCYTOSIS AND INFECTIOUS AGENTS.  B. COLON POLYP, ASCENDING; COLD BIOPSY: - TUBULAR ADENOMA. - NEGATIVE FOR HIGH-GRADE DYSPLASIA AND MALIGNANCY.   GROSS DESCRIPTION: A. Labeled: C BX duodenum rule out celiac disease Tissue fragment(s): multiple Size: aggregate, 1.2 x 0.3 x 0.1 cm Description: Tan fragments  Entirely submitted in 1 cassette(s).   B. Labeled: C BX polyp ascending colon Tissue fragment(s): 1 Size: 0.4 cm Descriptio n: tan fragment  Entirely submitted in 1 cassette(s).  Final Diagnosis performed by Bryan Lemma, MD.  Electronically signed 11/11/2016 1:35:30PM    The electronic signature indicates  that the named Attending Pathologist has evaluated the specimen  Technical component performed at [facility], [address] [place], [place] [address] Lab: [phone] Dir: Darrick Penna. Evette Doffing, MD  Professional component performed at [facility], [facility], [place], [place], [place] [address] Lab: [phone] Dir: Dellia Nims. Reuel Derby, MD        Assessment & Plan:   Problem List Items Addressed This Visit      Cardiovascular and Mediastinum   Essential hypertension    Under good control on current regimen. Continue current regimen. Continue to monitor. Call with any concerns.       Relevant Medications   lisinopril (PRINIVIL,ZESTRIL) 20 MG tablet   atorvastatin (LIPITOR) 20 MG tablet   Other Relevant Orders   CBC with Differential/Platelet   Comprehensive metabolic panel   Microalbumin, Urine Waived   Peripheral vascular disease ([place])    Under good control on current regimen. Will keep BP and cholesterol under good control. Continue to monitor. Call with any concerns.       Relevant Medications   lisinopril (PRINIVIL,ZESTRIL) 20 MG tablet   atorvastatin (LIPITOR) 20 MG tablet   Other Relevant Orders   CBC with Differential/Platelet   Comprehensive metabolic panel   Carotid atherosclerosis    Under good control on current regimen. Will keep BP and cholesterol under good control. Continue to monitor. Call with any concerns.       Relevant Medications   lisinopril (PRINIVIL,ZESTRIL) 20 MG tablet   atorvastatin (LIPITOR) 20 MG tablet     Respiratory   COPD (chronic obstructive pulmonary disease) (HCC)   Relevant Medications   budesonide-formoterol (SYMBICORT) 160-4.5 MCG/ACT inhaler   Other Relevant Orders   CBC with Differential/Platelet   Comprehensive metabolic panel     Endocrine   Hypothyroidism    Rechecking levels today. Await results. Adjust dose as needed.       Relevant Orders   CBC with Differential/Platelet   Comprehensive  metabolic panel   TSH     Other   Hyperlipidemia    Under good control on current regimen. Continue current regimen. Continue to monitor. Call with any concerns.       Relevant Medications   lisinopril (PRINIVIL,ZESTRIL) 20 MG tablet   atorvastatin (LIPITOR) 20 MG tablet   Other Relevant Orders   CBC with Differential/Platelet   Comprehensive metabolic panel   Lipid Panel w/o Chol/HDL Ratio   Tobacco use    Not interested in quitting right now. Continue to monitor. Call with any concerns.       Relevant Orders   CBC with Differential/Platelet   Comprehensive metabolic panel   UA/M w/rflx Culture, Routine    Other Visit Diagnoses    Routine general medical  examination at a health care facility    -  Primary   Vaccines up to date. Screening labs checked today. Pap N/A. Colon up to date. Mammogram and DEXA declined. Call witn any concerns.        Follow up plan: Return in about 6 months (around 10/10/2017) for 6 month follow up.   LABORATORY TESTING:  - Pap smear: not applicable  IMMUNIZATIONS:   - Tdap: Tetanus vaccination status reviewed: last tetanus booster within 10 years. - Influenza: Up to date - Pneumovax: Up to date - Prevnar: Up to date   SCREENING: -Mammogram: Refused  - Colonoscopy: Up to date  - Bone Density: Refused    PATIENT COUNSELING:   Advised to take 1 mg of folate supplement per day if capable of pregnancy.   Sexuality: Discussed sexually transmitted diseases, partner selection, use of condoms, avoidance of unintended pregnancy  and contraceptive alternatives.   Advised to avoid cigarette smoking.  I discussed with the patient that most people either abstain from alcohol or drink within safe limits (<=14/week and <=4 drinks/occasion for males, <=7/weeks and <= 3 drinks/occasion for females) and that the risk for alcohol disorders and other health effects rises proportionally with the number of drinks per week and how often a drinker exceeds  daily limits.  Discussed cessation/primary prevention of drug use and availability of treatment for abuse.   Diet: Encouraged to adjust caloric intake to maintain  or achieve ideal body weight, to reduce intake of dietary saturated fat and total fat, to limit sodium intake by avoiding high sodium foods and not adding table salt, and to maintain adequate dietary potassium and calcium preferably from fresh fruits, vegetables, and low-fat dairy products.    stressed the importance of regular exercise  Injury prevention: Discussed safety belts, safety helmets, smoke detector, smoking near bedding or upholstery.   Dental health: Discussed importance of regular tooth brushing, flossing, and dental visits.    NEXT PREVENTATIVE PHYSICAL DUE IN 1 YEAR. Return in about 6 months (around 10/10/2017) for 6 month follow up.

## 2017-04-09 NOTE — Progress Notes (Signed)
Subjective:   Mary Hoover is a 72 y.o. female who presents for Medicare Annual (Subsequent) preventive examination.  Review of Systems:   Cardiac Risk Factors include: hypertension;dyslipidemia;advanced age (>4mn, >>55women);smoking/ tobacco exposure     Objective:     Vitals: BP 130/73 (BP Location: Left Arm, Patient Position: Sitting)   Pulse 81   Temp 98 F (36.7 C) (Temporal)   Resp 17   Ht _0  (1.727 m)   Wt 148 lb (67.1 kg)   SpO2 99%   BMI 22.50 kg/m   Body mass index is 22.5 kg/m.  Advanced Directives 04/09/2017 11/14/2016 03/07/2016 01/11/2016 12/17/2014 12/11/2014  Does Patient Have a Medical Advance Directive? _1  No  Copy of Healthcare Power of Attorney in Chart? - - - - No - copy requested -  Would patient like information on creating a medical advance directive? Yes (MAU/Ambulatory/Procedural Areas - Information given) - - Yes (MAU/Ambulatory/Procedural Areas - Information given) No - patient declined information Yes - Educational materials given    Tobacco Social History   Tobacco Use  Smoking Status Current Every Day Smoker  . Packs/day: 0.50  . Years: 50.00  . Pack years: 25.00  . Types: Cigarettes  Smokeless Tobacco Never Used     Ready to quit: No Counseling given: Yes   Clinical Intake:  Pre-visit preparation completed: Yes  Pain : No/denies pain     Nutritional Status: BMI of 19-24  Normal Nutritional Risks: None Diabetes: No  How often do you need to have someone help you when you read instructions, pamphlets, or other written materials from your doctor or pharmacy?: 1 - Never What is the last grade level you completed in school?: 9th grade  Interpreter Needed?: No  Information entered by :: Tiffany Hill,LPN  Past Medical History:  Diagnosis Date  . Carotid atherosclerosis   . Carotid bruit   . COPD (chronic obstructive pulmonary disease) (HCC)    emphysema  . Hyperlipidemia   . Hypertension   .  Hypothyroidism   . Infected cat bite 1980s   was hospitalized  . Tobacco use    Past Surgical History:  Procedure Laterality Date  . CAROTID ENDARTERECTOMY Right Oct. 2015   Dr. DLucky Cowboy . CAROTID STENOSIS Left April 2016   carotid stenosis surgery  . COLONOSCOPY WITH PROPOFOL N/A 11/08/2016   Procedure: COLONOSCOPY WITH PROPOFOL;  Surgeon: AJonathon Bellows MD;  [facility];  Service: Gastroenterology;  Laterality: N/A;  . ESOPHAGOGASTRODUODENOSCOPY (EGD) WITH PROPOFOL N/A 11/08/2016   Procedure: ESOPHAGOGASTRODUODENOSCOPY (EGD) WITH PROPOFOL;  Surgeon: AJonathon Bellows MD;  [facility];  Service: Gastroenterology;  Laterality: N/A;  . FLEXIBLE BRONCHOSCOPY N/A 12/17/2014   Procedure: FLEXIBLE BRONCHOSCOPY;  Surgeon: DWilhelmina Mcardle MD;  Location: [facility] ORS;  Service: Pulmonary;  Laterality: N/A;  . INCISION AND DRAINAGE / EXCISION THYROGLOSSAL CYST  April 2011   Family History  Problem Relation Age of Onset  . Congestive Heart Failure Mother   . Heart disease Mother   . Hypertension Mother   . Hypertension Sister   . Diabetes Sister   . Heart disease Sister   . Cancer Brother        liver, lung  . Heart disease Maternal Grandmother   . Heart disease Maternal Uncle   . Stroke Maternal Grandfather   . Heart disease Brother   . Hypertension Brother   . COPD Neg Hx    Social History   Socioeconomic History  .  Marital status: Married    Spouse name: None  . Number of children: None  . Years of education: None  . Highest education level: None  Social Needs  . Financial resource strain: Not hard at all  . Food insecurity - worry: Never true  . Food insecurity - inability: Never true  . Transportation needs - medical: No  . Transportation needs - non-medical: No  Occupational History  . None  Tobacco Use  . Smoking status: Current Every Day Smoker    Packs/day: 0.50    Years: 50.00    Pack years: 25.00    Types: Cigarettes  . Smokeless tobacco: Never  Used  Substance and Sexual Activity  . Alcohol use: No    Alcohol/week: 0.0 oz    Comment: occasional  . Drug use: No  . Sexual activity: Yes  Other Topics Concern  . None  Social History Narrative   Lives at home with husband    Outpatient Encounter Medications as of 04/09/2017  Medication Sig  . atorvastatin (LIPITOR) 20 MG tablet TAKE 1 TABLET BY MOUTH AT  BEDTIME  . clopidogrel (PLAVIX) 75 MG tablet TAKE 1 TABLET BY MOUTH  DAILY  . ferrous sulfate 325 (65 FE) MG EC tablet Take 1 tablet (325 mg total) by mouth 3 (three) times daily with meals. (Patient taking differently: Take 325 mg by mouth daily with breakfast. )  . levothyroxine (SYNTHROID, LEVOTHROID) 75 MCG tablet Take 1 tablet (75 mcg total) by mouth daily.  Marland Kitchen lisinopril (PRINIVIL,ZESTRIL) 20 MG tablet TAKE 1 TABLET BY MOUTH  DAILY  . pantoprazole (PROTONIX) 20 MG tablet Take 1 tablet (20 mg total) daily by mouth.  . vitamin B-12 (CYANOCOBALAMIN) 500 MCG tablet Take 500 mcg by mouth daily.  Marland Kitchen aspirin 81 MG tablet Take 81 mg by mouth daily.  . budesonide-formoterol (SYMBICORT) 160-4.5 MCG/ACT inhaler Inhale 2 puffs into the lungs 2 (two) times daily. (Patient not taking: Reported on 04/09/2017)  . meclizine (ANTIVERT) 25 MG tablet Take 1 tablet (25 mg total) by mouth 3 (three) times daily as needed for dizziness or nausea. (Patient not taking: Reported on 04/09/2017)   No facility-administered encounter medications on file as of 04/09/2017.     Activities of Daily Living In your present state of health, do you have any difficulty performing the following activities: 04/09/2017  Hearing? Y  Vision? N  Comment difficulty reading   Difficulty concentrating or making decisions? Y  Comment remembering dates   Walking or climbing stairs? N  Dressing or bathing? N  Doing errands, shopping? N  Preparing Food and eating ? N  Using the Toilet? N  In the past six months, have you accidently leaked urine? N  Do you have problems  with loss of bowel control? N  Managing your Medications? N  Managing your Finances? N  Housekeeping or managing your Housekeeping? N  Some recent data might be hidden    Patient Care Team: [name], DO as PCP - General (Family Medicine)    Assessment:   This is a routine wellness examination for Chasitee.  Exercise Activities and Dietary recommendations Current Exercise Habits: The patient does not participate in regular exercise at present, Exercise limited by: None identified  Goals    . Quit Smoking     Smoking cessation discussed. Not ready to quit at this time.        Fall Risk Fall Risk  04/09/2017 01/11/2016 07/13/2015 12/11/2014  Falls in the past year? No  No No No   Is the patient's home free of loose throw rugs in walkways, pet beds, electrical cords, etc?   no      Grab bars in the bathroom? no      Handrails on the stairs?   yes      Adequate lighting?   yes  Timed Get Up and Go performed: Completed in 8 seconds with no use of assistive devices, steady gait. No intervention needed at this time.   Depression Screen PHQ 2/9 Scores 04/09/2017 01/11/2016 07/13/2015  PHQ - 2 Score 0 0 0     Cognitive Function     6CIT Screen 04/09/2017 01/11/2016  What Year? 0 points 0 points  What month? 0 points 0 points  What time? 0 points 0 points  Count back from 20 0 points 0 points  Months in reverse 0 points 2 points  Repeat phrase 0 points 2 points  Total Score 0 4    Immunization History  Administered Date(s) Administered  . Influenza, High Dose Seasonal PF 01/11/2016, 11/16/2016  . Influenza,inj,Quad PF,6+ Mos 11/25/2014  . Pneumococcal Conjugate-13 07/13/2015  . Pneumococcal Polysaccharide-23 07/10/2012  . Tdap 07/13/2015    Qualifies for Shingles Vaccine? Yes, discussed shingrix vaccine  Screening Tests Health Maintenance  Topic Date Due  . MAMMOGRAM  04/10/2018 (Originally 03/27/1997)  . DEXA SCAN  04/10/2018 (Originally 03/27/2012)  .  COLONOSCOPY  11/08/2021  . TETANUS/TDAP  07/12/2025  . INFLUENZA VACCINE  Completed  . Hepatitis C Screening  Completed  . PNA vac Low Risk Adult  Completed    Cancer Screenings: Lung: Low Dose CT Chest recommended if Age 1-80 years, 30 pack-year currently smoking OR have quit w/in 15years. Patient does qualify.- declined Breast:  Up to date on Mammogram? No  - declined Up to date of Bone Density/Dexa? No - declined Colorectal: completed 11/08/2016  Additional Screenings:  Hepatitis B/HIV/Syphillis: not indicated  Hepatitis C Screening: will draw at next lab draw     Plan:    I have personally reviewed and addressed the Medicare Annual Wellness questionnaire and have noted the following in the patient's chart:  A. Medical and social history B. Use of alcohol, tobacco or illicit drugs  C. Current medications and supplements D. Functional ability and status E.  Nutritional status F.  Physical activity G. Advance directives H. List of other physicians I.  Hospitalizations, surgeries, and ER visits in previous 12 months J.  [place] such as hearing and vision if needed, cognitive and depression L. Referrals and appointments   In addition, I have reviewed and discussed with patient certain preventive protocols, quality metrics, and best practice recommendations. A written personalized care plan for preventive services as well as general preventive health recommendations were provided to patient.   Signed,  Tyler Aas, LPN Nurse Health Advisor   Nurse Notes:none

## 2017-04-10 ENCOUNTER — Encounter: Payer: Self-pay | Admitting: Family Medicine

## 2017-04-10 LAB — CBC WITH DIFFERENTIAL/PLATELET
BASOS: 0 %
Basophils Absolute: 0 10*3/uL (ref 0.0–0.2)
EOS (ABSOLUTE): 0.1 10*3/uL (ref 0.0–0.4)
Eos: 2 %
Hematocrit: 36.9 % (ref 34.0–46.6)
Hemoglobin: 12.5 g/dL (ref 11.1–15.9)
IMMATURE GRANULOCYTES: 0 %
Immature Grans (Abs): 0 10*3/uL (ref 0.0–0.1)
Lymphocytes Absolute: 1.2 10*3/uL (ref 0.7–3.1)
Lymphs: 16 %
MCH: 31.8 pg (ref 26.6–33.0)
MCHC: 33.9 g/dL (ref 31.5–35.7)
MCV: 94 fL (ref 79–97)
MONOS ABS: 0.6 10*3/uL (ref 0.1–0.9)
Monocytes: 7 %
NEUTROS PCT: 75 %
Neutrophils Absolute: 5.8 10*3/uL (ref 1.4–7.0)
PLATELETS: 307 10*3/uL (ref 150–379)
RBC: 3.93 x10E6/uL (ref 3.77–5.28)
RDW: 13.6 % (ref 12.3–15.4)
WBC: 7.8 10*3/uL (ref 3.4–10.8)

## 2017-04-10 LAB — COMPREHENSIVE METABOLIC PANEL
A/G RATIO: 1.7 (ref 1.2–2.2)
ALT: 13 IU/L (ref 0–32)
AST: 14 IU/L (ref 0–40)
Albumin: 4.7 g/dL (ref 3.5–4.8)
Alkaline Phosphatase: 84 IU/L (ref 39–117)
BILIRUBIN TOTAL: 0.4 mg/dL (ref 0.0–1.2)
BUN/Creatinine Ratio: 10 — ABNORMAL LOW (ref 12–28)
BUN: 11 mg/dL (ref 8–27)
CALCIUM: 9.8 mg/dL (ref 8.7–10.3)
CHLORIDE: 100 mmol/L (ref 96–106)
CO2: 22 mmol/L (ref 20–29)
Creatinine, Ser: 1.09 mg/dL — ABNORMAL HIGH (ref 0.57–1.00)
GFR calc Af Amer: 59 mL/min/{1.73_m2} — ABNORMAL LOW (ref >59)
GFR, EST NON AFRICAN AMERICAN: 52 mL/min/{1.73_m2} — AB (ref >59)
Globulin, Total: 2.7 g/dL (ref 1.5–4.5)
Glucose: 80 mg/dL (ref 65–99)
POTASSIUM: 5.2 mmol/L (ref 3.5–5.2)
Sodium: 136 mmol/L (ref 134–144)
Total Protein: 7.4 g/dL (ref 6.0–8.5)

## 2017-04-10 LAB — LIPID PANEL W/O CHOL/HDL RATIO
CHOLESTEROL TOTAL: 148 mg/dL (ref 100–199)
HDL: 58 mg/dL (ref >39)
LDL Calculated: 70 mg/dL (ref 0–99)
Triglycerides: 102 mg/dL (ref 0–149)
VLDL Cholesterol Cal: 20 mg/dL (ref 5–40)

## 2017-04-10 LAB — HEPATITIS C ANTIBODY: Hep C Virus Ab: <0.1 s/co ratio (ref 0.0–0.9)

## 2017-04-10 LAB — TSH: TSH: 10.83 u[IU]/mL — ABNORMAL HIGH (ref 0.450–4.500)

## 2017-04-13 ENCOUNTER — Telehealth: Payer: Self-pay | Admitting: Family Medicine

## 2017-04-13 DIAGNOSIS — E034 Atrophy of thyroid (acquired): Secondary | ICD-10-CM

## 2017-04-13 MED ORDER — LEVOTHYROXINE SODIUM 88 MCG PO TABS: 88.0000 ug | ORAL_TABLET | Freq: Every day | ORAL | 2 refills | Status: DC

## 2017-04-13 NOTE — Telephone Encounter (Signed)
Called patient. Thyroid undertreated. Will increase to 78mcg and recheck labs in 6 weeks. Order in.

## 2017-05-30 ENCOUNTER — Other Ambulatory Visit: Payer: Medicare Other

## 2017-05-30 DIAGNOSIS — E039 Hypothyroidism, unspecified: Secondary | ICD-10-CM | POA: Diagnosis not present

## 2017-05-31 ENCOUNTER — Telehealth: Payer: Self-pay | Admitting: Family Medicine

## 2017-05-31 DIAGNOSIS — E039 Hypothyroidism, unspecified: Secondary | ICD-10-CM

## 2017-05-31 LAB — TSH: TSH: 5.39 u[IU]/mL — ABNORMAL HIGH (ref 0.450–4.500)

## 2017-05-31 MED ORDER — LEVOTHYROXINE SODIUM 100 MCG PO TABS: 100.0000 ug | ORAL_TABLET | Freq: Every day | ORAL | 2 refills | Status: DC

## 2017-05-31 NOTE — Telephone Encounter (Signed)
Patient notified, appointment scheduled

## 2017-05-31 NOTE — Telephone Encounter (Signed)
Please let her know that her thyroid is better, but not quite there. I'd like her to increase her dose to 173mcg and we'll recheck it in 6 weeks. Order in (lab visit only) and new Rx sent to her pharmacy.

## 2017-07-12 ENCOUNTER — Other Ambulatory Visit: Payer: Medicare Other

## 2017-09-15 ENCOUNTER — Other Ambulatory Visit: Payer: Self-pay | Admitting: Family Medicine

## 2017-10-08 ENCOUNTER — Other Ambulatory Visit: Payer: Self-pay | Admitting: Family Medicine

## 2017-10-08 NOTE — Telephone Encounter (Signed)
Can you find out if she has enough to get to her appointment on 10/16/17?

## 2017-10-16 ENCOUNTER — Encounter: Payer: Self-pay | Admitting: Family Medicine

## 2017-10-16 ENCOUNTER — Other Ambulatory Visit: Payer: Self-pay

## 2017-10-16 ENCOUNTER — Ambulatory Visit: Payer: Medicare Other | Admitting: Family Medicine

## 2017-10-16 VITALS — BP 110/72 | HR 100 | Temp 98.4°F | Ht 68.0 in | Wt 138.3 lb

## 2017-10-16 DIAGNOSIS — Z23 Encounter for immunization: Secondary | ICD-10-CM

## 2017-10-16 DIAGNOSIS — E034 Atrophy of thyroid (acquired): Secondary | ICD-10-CM

## 2017-10-16 DIAGNOSIS — R5382 Chronic fatigue, unspecified: Secondary | ICD-10-CM

## 2017-10-16 DIAGNOSIS — E782 Mixed hyperlipidemia: Secondary | ICD-10-CM

## 2017-10-16 DIAGNOSIS — J449 Chronic obstructive pulmonary disease, unspecified: Secondary | ICD-10-CM

## 2017-10-16 DIAGNOSIS — I10 Essential (primary) hypertension: Secondary | ICD-10-CM | POA: Diagnosis not present

## 2017-10-16 MED ORDER — PANTOPRAZOLE SODIUM 20 MG PO TBEC: 20.0000 mg | DELAYED_RELEASE_TABLET | Freq: Every day | ORAL | 1 refills | Status: DC

## 2017-10-16 MED ORDER — ATORVASTATIN CALCIUM 20 MG PO TABS: 20.0000 mg | ORAL_TABLET | Freq: Every day | ORAL | 1 refills | Status: DC

## 2017-10-16 MED ORDER — LISINOPRIL 20 MG PO TABS: 20.0000 mg | ORAL_TABLET | Freq: Every day | ORAL | 1 refills | Status: DC

## 2017-10-16 NOTE — Assessment & Plan Note (Signed)
Started smoking again. Lungs wheezy. Work on taking her symbicort. Call with any concerns.

## 2017-10-16 NOTE — Assessment & Plan Note (Signed)
Under good control on current regimen. Continue current regimen. Continue to monitor. Call with any concerns. Refills given.   

## 2017-10-16 NOTE — Assessment & Plan Note (Signed)
Rechecking levels today. Await results. Call with any concerns.  

## 2017-10-16 NOTE — Progress Notes (Signed)
BP 110/72   Pulse 100   Temp 98.4 F (36.9 C) (Oral)   Ht 5\' 8"  (1.727 m)   Wt 138 lb 4.8 oz (62.7 kg)   SpO2 100%   BMI 21.03 kg/m    Subjective:    Patient ID: Mary Hoover, female    DOB: [birth year], 72 y.o.   MRN: [id number]  HPI: Mary Hoover is a 72 y.o. female  Chief Complaint  Patient presents with  . Hypertension    6 m f/u  . Hyperlipidemia  . Hypothyroidism   HYPERTENSION / HYPERLIPIDEMIA Satisfied with current treatment? yes Duration of hypertension: chronic BP monitoring frequency: not checking BP medication side effects: no Past BP meds: lisinopril Duration of hyperlipidemia: chronic Cholesterol medication side effects: no Cholesterol supplements: none Past cholesterol medications: atorvastatin Medication compliance: excellent compliance Aspirin: no Recent stressors: no Recurrent headaches: no Visual changes: no Palpitations: no Dyspnea: no Chest pain: no Lower extremity edema: no Dizzy/lightheaded: no  HYPOTHYROIDISM Thyroid control status:stable Satisfied with current treatment? yes Medication side effects: no Medication compliance: excellent compliance Etiology of hypothyroidism:  Recent dose adjustment:yes Fatigue: yes Cold intolerance: yes Heat intolerance: no Weight gain: no Weight loss: no Constipation: no Diarrhea/loose stools: no Palpitations: no Lower extremity edema: no Anxiety/depressed mood: no  Relevant past medical, surgical, family and social history reviewed and updated as indicated. Interim medical history since our last visit reviewed. Allergies and medications reviewed and updated.  Review of Systems  Constitutional: Positive for fatigue. Negative for activity change, appetite change, chills, diaphoresis, fever and unexpected weight change.  Respiratory: Negative.   Cardiovascular: Negative.   Neurological: Negative.   Psychiatric/Behavioral: Negative.     Per HPI unless specifically indicated  above     Objective:    BP 110/72   Pulse 100   Temp 98.4 F (36.9 C) (Oral)   Ht 5\' 8"  (1.727 m)   Wt 138 lb 4.8 oz (62.7 kg)   SpO2 100%   BMI 21.03 kg/m   Wt Readings from Last 3 Encounters:  10/16/17 138 lb 4.8 oz (62.7 kg)  04/09/17 148 lb (67.1 kg)  04/09/17 148 lb (67.1 kg)    Physical Exam  Constitutional: She is oriented to person, place, and time. She appears well-developed and well-nourished. No distress.  HENT:  Head: Normocephalic and atraumatic.  Right Ear: Hearing normal.  Left Ear: Hearing normal.  Nose: Nose normal.  Eyes: Conjunctivae and lids are normal. Right eye exhibits no discharge. Left eye exhibits no discharge. No scleral icterus.  Cardiovascular: Normal rate, regular rhythm, normal heart sounds and intact distal pulses. Exam reveals no gallop and no friction rub.  No murmur heard. Pulmonary/Chest: Effort normal. No stridor. No respiratory distress. She has wheezes. She has no rales. She exhibits no tenderness.  Musculoskeletal: Normal range of motion.  Neurological: She is alert and oriented to person, place, and time.  Skin: Skin is warm, dry and intact. Capillary refill takes less than 2 seconds. No rash noted. She is not diaphoretic. No erythema. No pallor.  Psychiatric: She has a normal mood and affect. Her speech is normal and behavior is normal. Judgment and thought content normal. Cognition and memory are normal.  Nursing note and vitals reviewed.   Results for orders placed or performed in visit on 05/30/17  TSH  Result Value Ref Range   TSH 5.390 (H) 0.450 - 4.500 uIU/mL      Assessment & Plan:   Problem List Items Addressed This Visit  Cardiovascular and Mediastinum   Essential hypertension - Primary    Under good control on current regimen. Continue current regimen. Continue to monitor. Call with any concerns. Refills given.        Relevant Medications   atorvastatin (LIPITOR) 20 MG tablet   lisinopril (PRINIVIL,ZESTRIL)  20 MG tablet   Other Relevant Orders   Comprehensive metabolic panel     Respiratory   COPD (chronic obstructive pulmonary disease) ([place])    Started smoking again. Lungs wheezy. Work on taking her symbicort. Call with any concerns.         Endocrine   Hypothyroidism    Rechecking levels today. Await results. Call with any concerns.       Relevant Orders   Comprehensive metabolic panel   TSH     Other   Hyperlipidemia    Under good control on current regimen. Continue current regimen. Continue to monitor. Call with any concerns. Refills given.        Relevant Medications   atorvastatin (LIPITOR) 20 MG tablet   lisinopril (PRINIVIL,ZESTRIL) 20 MG tablet   Other Relevant Orders   Comprehensive metabolic panel   Lipid Panel w/o Chol/HDL Ratio    Other Visit Diagnoses    Flu vaccine need       Flu shot given today.   Relevant Orders   Flu vaccine HIGH DOSE PF (Completed)   Chronic fatigue       Checking labs today. Await results.    Relevant Orders   CBC with Differential/Platelet       Follow up plan: Return in about 6 months (around 04/16/2018) for Physical and wellness.

## 2017-10-17 LAB — COMPREHENSIVE METABOLIC PANEL
ALT: 14 IU/L (ref 0–32)
AST: 15 IU/L (ref 0–40)
Albumin/Globulin Ratio: 2.1 (ref 1.2–2.2)
Albumin: 4.5 g/dL (ref 3.5–4.8)
Alkaline Phosphatase: 78 IU/L (ref 39–117)
BUN/Creatinine Ratio: 11 — ABNORMAL LOW (ref 12–28)
BUN: 11 mg/dL (ref 8–27)
Bilirubin Total: 0.3 mg/dL (ref 0.0–1.2)
CALCIUM: 9.4 mg/dL (ref 8.7–10.3)
CO2: 25 mmol/L (ref 20–29)
CREATININE: 0.97 mg/dL (ref 0.57–1.00)
Chloride: 100 mmol/L (ref 96–106)
GFR, EST AFRICAN AMERICAN: 68 mL/min/{1.73_m2} (ref >59)
GFR, EST NON AFRICAN AMERICAN: 59 mL/min/{1.73_m2} — AB (ref >59)
Globulin, Total: 2.1 g/dL (ref 1.5–4.5)
Glucose: 114 mg/dL — ABNORMAL HIGH (ref 65–99)
Potassium: 4.4 mmol/L (ref 3.5–5.2)
Sodium: 138 mmol/L (ref 134–144)
TOTAL PROTEIN: 6.6 g/dL (ref 6.0–8.5)

## 2017-10-17 LAB — CBC WITH DIFFERENTIAL/PLATELET
BASOS ABS: 0 10*3/uL (ref 0.0–0.2)
Basos: 0 %
EOS (ABSOLUTE): 0.2 10*3/uL (ref 0.0–0.4)
Eos: 2 %
HEMOGLOBIN: 11.6 g/dL (ref 11.1–15.9)
Hematocrit: 34.8 % (ref 34.0–46.6)
IMMATURE GRANULOCYTES: 0 %
Immature Grans (Abs): 0 10*3/uL (ref 0.0–0.1)
Lymphocytes Absolute: 1.7 10*3/uL (ref 0.7–3.1)
Lymphs: 16 %
MCH: 31.8 pg (ref 26.6–33.0)
MCHC: 33.3 g/dL (ref 31.5–35.7)
MCV: 95 fL (ref 79–97)
MONOCYTES: 5 %
Monocytes Absolute: 0.6 10*3/uL (ref 0.1–0.9)
NEUTROS ABS: 8 10*3/uL — AB (ref 1.4–7.0)
NEUTROS PCT: 77 %
Platelets: 316 10*3/uL (ref 150–450)
RBC: 3.65 x10E6/uL — ABNORMAL LOW (ref 3.77–5.28)
RDW: 13.8 % (ref 12.3–15.4)
WBC: 10.5 10*3/uL (ref 3.4–10.8)

## 2017-10-17 LAB — LIPID PANEL W/O CHOL/HDL RATIO
Cholesterol, Total: 125 mg/dL (ref 100–199)
HDL: 58 mg/dL (ref >39)
LDL CALC: 48 mg/dL (ref 0–99)
TRIGLYCERIDES: 94 mg/dL (ref 0–149)
VLDL Cholesterol Cal: 19 mg/dL (ref 5–40)

## 2017-10-17 LAB — TSH: TSH: 8.66 u[IU]/mL — AB (ref 0.450–4.500)

## 2017-10-22 ENCOUNTER — Telehealth: Payer: Self-pay | Admitting: Family Medicine

## 2017-10-22 DIAGNOSIS — E034 Atrophy of thyroid (acquired): Secondary | ICD-10-CM

## 2017-10-22 MED ORDER — LEVOTHYROXINE SODIUM 125 MCG PO TABS: 125.0000 ug | ORAL_TABLET | Freq: Every day | ORAL | 2 refills | Status: DC

## 2017-10-22 NOTE — Telephone Encounter (Signed)
Lease let her know that her labs look good, but her thyroid is slightly under treated. I'm going to increase her thyroid medicine to 121mcg daily and I'd like her to come back in for just a lab visit in 6 weeks to recheck it. Orders in.

## 2017-10-22 NOTE — Telephone Encounter (Signed)
Left message on machine for pt to return call to the office.  

## 2017-10-22 NOTE — Telephone Encounter (Signed)
Message from [place] on 10/22/17 read to patient. Patient verbalized understanding of all.

## 2017-11-20 ENCOUNTER — Ambulatory Visit (INDEPENDENT_AMBULATORY_CARE_PROVIDER_SITE_OTHER): Payer: Medicare Other | Admitting: Vascular Surgery

## 2017-11-20 ENCOUNTER — Encounter (INDEPENDENT_AMBULATORY_CARE_PROVIDER_SITE_OTHER): Payer: Medicare Other

## 2017-12-14 ENCOUNTER — Ambulatory Visit (INDEPENDENT_AMBULATORY_CARE_PROVIDER_SITE_OTHER): Payer: Medicare Other | Admitting: Vascular Surgery

## 2017-12-14 ENCOUNTER — Encounter (INDEPENDENT_AMBULATORY_CARE_PROVIDER_SITE_OTHER): Payer: Medicare Other

## 2017-12-26 ENCOUNTER — Encounter (INDEPENDENT_AMBULATORY_CARE_PROVIDER_SITE_OTHER): Payer: Medicare Other

## 2017-12-26 ENCOUNTER — Ambulatory Visit (INDEPENDENT_AMBULATORY_CARE_PROVIDER_SITE_OTHER): Payer: Medicare Other | Admitting: Nurse Practitioner

## 2018-01-03 ENCOUNTER — Other Ambulatory Visit: Payer: Self-pay | Admitting: Family Medicine

## 2018-01-03 NOTE — Telephone Encounter (Signed)
Attempted to call patient regarding refill request- not sure if she actually requested- patient is not taking 75 mcg dose- she is now on 125 mcg dose of Levothyroxine. Other medication is discontinued medication. Left message for patient to call back to verify Rx need.

## 2018-01-03 NOTE — Telephone Encounter (Signed)
Requested medication (s) are due for refill today: yes  Requested medication (s) are on the active medication list: Pharmacy has RX for 42mcg    Last refill: 09/27/17  Future visit scheduled yes 04/25/2018 Dr. Wynetta Emery  Notes to clinic:Pt was to have TSH drawn early Nov.  Not drawn. Pharmacy has RX for 45mcg. Was increased to 198mcg 10/22/17.  Last TSH 10/16/17  Requested Prescriptions  Pending Prescriptions Disp Refills   levothyroxine (SYNTHROID, LEVOTHROID) 75 MCG tablet [Pharmacy Med Name: LEVOTHYROXINE  0.075MG   TAB] 90 tablet 3    Sig: TAKE 1 TABLET BY MOUTH  DAILY     Endocrinology:  Hypothyroid Agents Failed - 01/03/2018  3:39 PM      Failed - TSH needs to be rechecked within 3 months after an abnormal result. Refill until TSH is due.      Failed - TSH in normal range and within 360 days    TSH  Date Value Ref Range Status  10/16/2017 8.660 (H) 0.450 - 4.500 uIU/mL Final         Passed - Valid encounter within last 12 months    Recent Outpatient Visits          2 months ago Essential hypertension   [place], Megan P, DO   8 months ago Routine general medical examination at a health care facility   [facility], [place] P, DO   1 year ago Acute right-sided low back pain without sciatica   [place], Megan P, DO   1 year ago Hypothyroidism due to acquired atrophy of thyroid   [place], [place], DO   1 year ago Essential hypertension   [place], Megan P, DO      Future Appointments            In 3 months  [place], [facility]   In 3 months [place], Megan P, DO [place], [facility]          sucralfate (CARAFATE) 1 g tablet [employer] Med Name: SUCRALFATE  1GM  TAB] 240 tablet     Sig: TAKE 1 TABLET BY MOUTH 4  TIMES DAILY - WITH MEALS  AND AT BEDTIME.     Gastroenterology: Antiacids Passed - 01/03/2018  3:39 PM      Passed - Valid  encounter within last 12 months    Recent Outpatient Visits          2 months ago Essential hypertension   [place], Megan P, DO   8 months ago Routine general medical examination at a health care facility   [facility], [place] P, DO   1 year ago Acute right-sided low back pain without sciatica   [place], Megan P, DO   1 year ago Hypothyroidism due to acquired atrophy of thyroid   [place], [place] P, DO   1 year ago Essential hypertension   [place], Barb Merino, DO      Future Appointments            In 3 months  [place], [place]   In 3 months [place], Barb Merino, DO [employer], [facility]

## 2018-01-07 ENCOUNTER — Ambulatory Visit: Payer: Self-pay

## 2018-01-07 ENCOUNTER — Telehealth: Payer: Self-pay | Admitting: Family Medicine

## 2018-01-07 ENCOUNTER — Other Ambulatory Visit: Payer: Medicare Other

## 2018-01-07 DIAGNOSIS — E034 Atrophy of thyroid (acquired): Secondary | ICD-10-CM

## 2018-01-07 NOTE — Telephone Encounter (Signed)
Pt returned call.  Per note pt needs a TSH before Synthroid can be ordered. Pt states she is taking 75 mcg. And note states she should be taking 125 as of 9/23 note. Office [place] contacted. Pt will come to have TSH drawn. She will be contacted by office of results and future dose of synthroid. Pt verbalized understanding of all instructions.  Reason for Disposition . Health Information question, no triage required and triager able to answer question  Answer Assessment - Initial Assessment Questions 1. REASON FOR CALL or QUESTION: "What is your reason for calling today?" or "How can I best help you?" or "What question do you have that I can help answer?"     Pt was contacted via phone by office with questions about her synthroid and lab needed for TSH.  Protocols used: INFORMATION ONLY CALL-A-AH

## 2018-01-07 NOTE — Telephone Encounter (Signed)
Copied from [place] [phone]. Topic: General - Other >> Jan 07, 2018 11:38 AM Bea Graff, NT wrote: Reason for CRM: Pt returning call to Dr. Durenda Age nurse. She states the nurse left a message about medication. >> Jan 07, 2018 12:32 PM Amada Kingfisher, CMA wrote: Attempted to reach patient. Was advised she was busy. Triage nurse, attempted to reach patient initially. Please transfer back to them in regards to refill.  >> Jan 07, 2018  1:17 PM Don Perking M wrote: Pt needs TSH drawn, is this something that can be done without an OV?

## 2018-01-07 NOTE — Telephone Encounter (Signed)
Order is already in- she can come in whenever she needs to

## 2018-01-07 NOTE — Telephone Encounter (Signed)
Pt.notified

## 2018-01-08 LAB — TSH: TSH: 0.88 u[IU]/mL (ref 0.450–4.500)

## 2018-01-10 ENCOUNTER — Telehealth: Payer: Self-pay | Admitting: Family Medicine

## 2018-01-10 MED ORDER — LEVOTHYROXINE SODIUM 75 MCG PO TABS: 75.0000 ug | ORAL_TABLET | Freq: Every day | ORAL | 3 refills | Status: DC

## 2018-01-10 NOTE — Telephone Encounter (Signed)
Can we please check to see if she's taking the 185mcg or the 56mcg? Her thyroid is good, and I'll get her refills when I know what she's taking

## 2018-01-10 NOTE — Telephone Encounter (Signed)
Spoke with patient she is taking the 75 mcg.

## 2018-01-28 ENCOUNTER — Telehealth: Payer: Self-pay | Admitting: Family Medicine

## 2018-01-28 DIAGNOSIS — E034 Atrophy of thyroid (acquired): Secondary | ICD-10-CM

## 2018-01-28 NOTE — Telephone Encounter (Signed)
Copied from [place] [id number]. Topic: Quick Communication - See Telephone Encounter >> Jan 28, 2018 11:26 AM Loma [place] wrote: CRM for notification. See Telephone encounter for: 01/28/18. Says that script of levothyroxine (SYNTHROID, LEVOTHROID) on 12/12 called into [facility] was supposed to be for 125 not 75 mg requesting new dosage called in

## 2018-01-28 NOTE — Telephone Encounter (Signed)
She had confirmed that she had been taking 15mcg- her thyroid levels were bordering low. As there seems to be some confusion as to what she was taking when her labs were checked. Let's have her stay on the 4mcg for the next 4 weeks and then return for a recheck- if she's low again, we'll go up on her dose pending that lab work. Lab order placed.

## 2018-01-28 NOTE — Telephone Encounter (Signed)
Sending this to provider to review / Per TE on 01-10-18 patient verified she was taking 20mcg / Now patient is saying it should be for 185mcg

## 2018-01-29 NOTE — Telephone Encounter (Signed)
Message relayed to patient. Verbalized understanding and denied questions.  Pt will come by in 1 month to have labs drawn.

## 2018-03-07 ENCOUNTER — Other Ambulatory Visit: Payer: Self-pay | Admitting: Family Medicine

## 2018-03-07 NOTE — Telephone Encounter (Signed)
Refilled until next appointment in 1 month Requested Prescriptions  Pending Prescriptions Disp Refills  . sucralfate (CARAFATE) 1 g tablet [Pharmacy Med Name: SUCRALFATE  1GM  TAB] 360 tablet 0    Sig: TAKE 1 TABLET BY MOUTH 4  TIMES DAILY - WITH MEALS  AND AT BEDTIME.     Gastroenterology: Antiacids Passed - 03/07/2018  5:04 AM      Passed - Valid encounter within last 12 months    Recent Outpatient Visits          4 months ago Essential hypertension   [place], Megan P, DO   11 months ago Routine general medical examination at a health care facility   [facility], [place] P, DO   1 year ago Acute right-sided low back pain without sciatica   [place], Megan P, DO   1 year ago Hypothyroidism due to acquired atrophy of thyroid   [place], [place] P, DO   1 year ago Essential hypertension   [place], Barb Merino, DO      Future Appointments            In 1 month  [place], [place]   In 1 month [place], Megan P, DO [facility], [facility]

## 2018-04-11 ENCOUNTER — Ambulatory Visit: Payer: Medicare Other

## 2018-04-23 ENCOUNTER — Telehealth: Payer: Self-pay | Admitting: Family Medicine

## 2018-04-23 NOTE — Telephone Encounter (Signed)
Called patient to get permission to do her Medicare Annual Wellness Visit on 04/25/2018 at 1:00 PM by telephone.  Patient authorized the visit to be done telephonically.  Patient also rescheduled her physical to 07/11/2018 at 3:00 PM with Dr. Park Liter at [facility].  Janace Hoard, Care Guide.

## 2018-04-25 ENCOUNTER — Ambulatory Visit (INDEPENDENT_AMBULATORY_CARE_PROVIDER_SITE_OTHER): Payer: Medicare Other

## 2018-04-25 ENCOUNTER — Encounter: Payer: Medicare Other | Admitting: Family Medicine

## 2018-04-25 ENCOUNTER — Other Ambulatory Visit: Payer: Self-pay

## 2018-04-25 VITALS — Temp 97.5°F

## 2018-04-25 DIAGNOSIS — Z0000 Encounter for general adult medical examination without abnormal findings: Secondary | ICD-10-CM | POA: Diagnosis not present

## 2018-04-25 NOTE — Progress Notes (Signed)
Subjective:   Mary Hoover is a 73 y.o. female who presents for Medicare Annual (Subsequent) preventive examination.  This visit is being conducted via telephone due to the COVID-19 pandemic. This patient has given me verbal consent via telephone to conduct this visit. Some vital signs may be absent or patient reported.  Patient identification: identified by name, DOB, and current address.    Review of Systems:  Cardiac Risk Factors include: advanced age (>53mn, >>45women);hypertension;dyslipidemia;smoking/ tobacco exposure     Objective:     Vitals: Temp (!) 97.5 F (36.4 C) (Oral) Comment: patient reported Comment (Src): patient reported  There is no height or weight on file to calculate BMI.  Advanced Directives 04/25/2018 04/09/2017 11/14/2016 03/07/2016 01/11/2016 12/17/2014 12/11/2014  Does Patient Have a Medical Advance Directive? _0  No No  Copy of Healthcare Power of Attorney in Chart? - - - - - No - copy requested -  Would patient like information on creating a medical advance directive? Yes (MAU/Ambulatory/Procedural Areas - Information given) Yes (MAU/Ambulatory/Procedural Areas - Information given) - - Yes (MAU/Ambulatory/Procedural Areas - Information given) No - patient declined information Yes - Educational materials given    Tobacco Social History   Tobacco Use  Smoking Status Current Every Day Smoker   Packs/day: 0.75   Years: 50.00   Pack years: 37.50   Types: Cigarettes  Smokeless Tobacco Never Used     Ready to quit: No Counseling given: No   Clinical Intake:  Pre-visit preparation completed: Yes  Pain : No/denies pain     Nutritional Status: BMI of 19-24  Normal Nutritional Risks: None Diabetes: No  How often do you need to have someone help you when you read instructions, pamphlets, or other written materials from your doctor or pharmacy?: 1 - Never What is the last grade level you completed in school?: 9th grade    Interpreter Needed?: No  Information entered by :: Nataley Bahri,LPN  Past Medical History:  Diagnosis Date   Carotid atherosclerosis    Carotid bruit    COPD (chronic obstructive pulmonary disease) ([place]    emphysema   Hyperlipidemia    Hypertension    Hypothyroidism    Infected cat bite 1980s   was hospitalized   Tobacco use    Past Surgical History:  Procedure Laterality Date   CAROTID ENDARTERECTOMY Right Oct. 2015   Dr. DLucky Cowboy  CAROTID STENOSIS Left April 2016   carotid stenosis surgery   COLONOSCOPY WITH PROPOFOL N/A 11/08/2016   Procedure: COLONOSCOPY WITH PROPOFOL;  Surgeon: AJonathon Bellows MD;  [facility];  Service: Gastroenterology;  Laterality: N/A;   ESOPHAGOGASTRODUODENOSCOPY (EGD) WITH PROPOFOL N/A 11/08/2016   Procedure: ESOPHAGOGASTRODUODENOSCOPY (EGD) WITH PROPOFOL;  Surgeon: AJonathon Bellows MD;  [facility];  Service: Gastroenterology;  Laterality: N/A;   FLEXIBLE BRONCHOSCOPY N/A 12/17/2014   Procedure: FLEXIBLE BRONCHOSCOPY;  Surgeon: DWilhelmina Mcardle MD;  Location: [facility] ORS;  Service: Pulmonary;  Laterality: N/A;   INCISION AND DRAINAGE / EXCISION THYROGLOSSAL CYST  April 2011   Family History  Problem Relation Age of Onset   Congestive Heart Failure Mother    Heart disease Mother    Hypertension Mother    Hypertension Sister    Diabetes Sister    Heart disease Sister    Cancer Brother        liver, lung   Heart disease Maternal Grandmother    Heart disease Maternal Uncle    Stroke Maternal Grandfather  Heart disease Brother    Hypertension Brother    COPD Neg Hx    Social History   Socioeconomic History   Marital status: Married    Spouse name: Not on file   Number of children: Not on file   Years of education: Not on file   Highest education level: 9th grade  Occupational History   Occupation: retired   Scientist, product/process development strain: Not hard at [employer]  insecurity:    Worry: Never true    Inability: Never true   Transportation needs:    Medical: No    Non-medical: No  Tobacco Use   Smoking status: Current Every Day Smoker    Packs/day: 0.75    Years: 50.00    Pack years: 37.50    Types: Cigarettes   Smokeless tobacco: Never Used  Substance and Sexual Activity   Alcohol use: No    Alcohol/week: 0.0 standard drinks   Drug use: No   Sexual activity: Yes  Lifestyle   Physical activity:    Days per week: 0 days    Minutes per session: 0 min   Stress: Not at all  Relationships   Social connections:    Talks on phone: More than three times a week    Gets together: Twice a week    Attends religious service: Never    Active member of club or organization: No    Attends meetings of clubs or organizations: Never    Relationship status: Married  Other Topics Concern   Not on file  Social History Narrative   Lives at home with husband    Outpatient Encounter Medications as of 04/25/2018  Medication Sig   clopidogrel (PLAVIX) 75 MG tablet Take 1 tablet (75 mg total) by mouth daily.   ferrous sulfate 325 (65 FE) MG EC tablet Take 1 tablet (325 mg total) by mouth daily with breakfast.   levothyroxine (SYNTHROID, LEVOTHROID) 75 MCG tablet Take 1 tablet (75 mcg total) by mouth daily.   lisinopril (PRINIVIL,ZESTRIL) 20 MG tablet Take 1 tablet (20 mg total) by mouth daily.   Multiple Vitamin (MULTIVITAMIN) tablet Take 1 tablet by mouth daily.   vitamin B-12 (CYANOCOBALAMIN) 500 MCG tablet Take 500 mcg by mouth daily.   atorvastatin (LIPITOR) 20 MG tablet Take 1 tablet (20 mg total) by mouth at bedtime. (Patient not taking: Reported on 04/25/2018)   budesonide-formoterol (SYMBICORT) 160-4.5 MCG/ACT inhaler Inhale 2 puffs into the lungs 2 (two) times daily. (Patient not taking: Reported on 04/25/2018)   [DISCONTINUED] pantoprazole (PROTONIX) 20 MG tablet Take 1 tablet (20 mg total) by mouth daily. (Patient not taking:  Reported on 04/25/2018)   [DISCONTINUED] sucralfate (CARAFATE) 1 g tablet TAKE 1 TABLET BY MOUTH 4  TIMES DAILY - WITH MEALS  AND AT BEDTIME. (Patient not taking: Reported on 04/25/2018)   No facility-administered encounter medications on file as of 04/25/2018.     Activities of Daily Living In your present state of health, do you have any difficulty performing the following activities: 04/25/2018  Hearing? Y  Comment no hearing aids   Vision? N  Difficulty concentrating or making decisions? Y  Walking or climbing stairs? N  Dressing or bathing? N  Doing errands, shopping? N  Preparing Food and eating ? N  Using the Toilet? N  In the past six months, have you accidently leaked urine? N  Do you have problems with loss of bowel control? N  Managing your Medications? N  Managing your Finances? N  Housekeeping or managing your Housekeeping? N  Some recent data might be hidden    Patient Care Team: [name], DO as PCP - General (Family Medicine)    Assessment:   This is a routine wellness examination for Hollyn.  Exercise Activities and Dietary recommendations Current Exercise Habits: The patient does not participate in regular exercise at present, Exercise limited by: None identified  Goals     Quit Smoking     Smoking cessation discussed. Not ready to quit at this time.        Fall Risk: Fall Risk  04/25/2018 04/09/2017 01/11/2016 07/13/2015 12/11/2014  Falls in the past year? 0 No No No No  Number falls in past yr: 0 - - - -    FALL RISK PREVENTION PERTAINING TO THE HOME:  Any stairs in or around the home? Yes  If so, are there any without handrails? No   Home free of loose throw rugs in walkways, pet beds, electrical cords, etc? Yes  Adequate lighting in your home to reduce risk of falls? Yes   ASSISTIVE DEVICES UTILIZED TO PREVENT FALLS:  Life alert? No  Use of a cane, walker or w/c? No  Grab bars in the bathroom? No  Shower chair or bench in shower? No   Elevated toilet seat or a handicapped toilet? No   DME ORDERS:  DME order needed?  No   TIMED UP AND GO:  Was the test performed? No .  unable to perform     Depression Screen PHQ 2/9 Scores 04/25/2018 04/09/2017 01/11/2016 07/13/2015  PHQ - 2 Score 0 0 0 0     Cognitive Function     6CIT Screen 04/25/2018 04/09/2017 01/11/2016  What Year? 0 points 0 points 0 points  What month? 0 points 0 points 0 points  What time? 0 points 0 points 0 points  Count back from 20 0 points 0 points 0 points  Months in reverse 0 points 0 points 2 points  Repeat phrase 0 points 0 points 2 points  Total Score 0 0 4    Immunization History  Administered Date(s) Administered   Influenza, High Dose Seasonal PF 01/11/2016, 11/16/2016, 10/16/2017   Influenza,inj,Quad PF,6+ Mos 11/25/2014   Pneumococcal Conjugate-13 07/13/2015   Pneumococcal Polysaccharide-23 07/10/2012   Tdap 07/13/2015    Qualifies for Shingles Vaccine? Yes  Zostavax completed n/a. Due for Shingrix. Education has been provided regarding the importance of this vaccine. Pt has been advised to call insurance company to determine out of pocket expense. Advised may also receive vaccine at local pharmacy or Health Dept. Verbalized acceptance and understanding.  Tdap: up to date   Flu Vaccine: up to date  Pneumococcal Vaccine: up to date   Screening Tests Health Maintenance  Topic Date Due   MAMMOGRAM  03/27/1997   DEXA SCAN  03/27/2012   COLONOSCOPY  11/08/2021   TETANUS/TDAP  07/12/2025   INFLUENZA VACCINE  Completed   Hepatitis C Screening  Completed   PNA vac Low Risk Adult  Completed    Cancer Screenings:  Colorectal Screening: Completed 11/08/2016 Repeat every 5 years;   Mammogram: declined  Bone Density: declined  Lung Cancer Screening: (Low Dose CT Chest recommended if Age 36-80 years, 30 pack-year currently smoking OR have quit w/in 15years.) does not qualify.    Additional  Screening:  Hepatitis C Screening: does qualify; Completed 04/09/2017  Vision Screening: Recommended annual ophthalmology exams for early detection of glaucoma and  other disorders of the eye. Is the patient up to date with their annual eye exam?  No   If pt is not established with a provider, would they like to be referred to a provider to establish care? No . Will go to [facility] if needed   Dental Screening: Recommended annual dental exams for proper oral hygiene  Community Resource Referral:  CRR required this visit?  No       Plan:  I have personally reviewed and addressed the Medicare Annual Wellness questionnaire and have noted the following in the patients chart:  A. Medical and social history B. Use of alcohol, tobacco or illicit drugs  C. Current medications and supplements D. Functional ability and status E.  Nutritional status F.  Physical activity G. Advance directives H. List of other physicians I.  Hospitalizations, surgeries, and ER visits in previous 12 months J.  Independence such as hearing and vision if needed, cognitive and depression L. Referrals and appointments   In addition, I have reviewed and discussed with patient certain preventive protocols, quality metrics, and best practice recommendations. A written personalized care plan for preventive services as well as general preventive health recommendations were offer via mail to patient.  Patient declined AVS to be mailed at this time. She will pick up a copy at next visit.    Signed,    Bevelyn Ngo, LPN  4/65/0354 Nurse Health Advisor   Nurse Notes: stopped taking atorvastatin in feb 2020 due to dizziness and upset stomach,  Said these symptoms stopped when she stopped the medication and refuses to restart it. Next scheduled appt is 07/11/2018.

## 2018-04-25 NOTE — Patient Instructions (Signed)
Mary Hoover , Thank you for taking time to come for your Medicare Wellness Visit. I appreciate your ongoing commitment to your health goals. Please review the following plan we discussed and let me know if I can assist you in the future.   Screening recommendations/referrals: Colonoscopy: completed 11/08/2016 Mammogram: declined Bone Density: declined Recommended yearly ophthalmology/optometry visit for glaucoma screening and checkup Recommended yearly dental visit for hygiene and checkup  Vaccinations: Influenza vaccine: up to date Pneumococcal vaccine: up to date Tdap vaccine: up to date Shingles vaccine: shingrix eligible, check with your insurance company for coverage     Advanced directives: Advance directive discussed with you today.  Conditions/risks identified: none  Next appointment: follow up as scheduled with Dr.Johnson. Follow up in one year for your annual wellness exam.    Preventive Care 71 Years and Older, Female Preventive care refers to lifestyle choices and visits with your health care provider that can promote health and wellness. What does preventive care include?  A yearly physical exam. This is also called an annual well check.  Dental exams once or twice a year.  Routine eye exams. Ask your health care provider how often you should have your eyes checked.  Personal lifestyle choices, including:  Daily care of your teeth and gums.  Regular physical activity.  Eating a healthy diet.  Avoiding tobacco and drug use.  Limiting alcohol use.  Practicing safe sex.  Taking low-dose aspirin every day.  Taking vitamin and mineral supplements as recommended by your health care provider. What happens during an annual well check? The services and screenings done by your health care provider during your annual well check will depend on your age, overall health, lifestyle risk factors, and family history of disease. Counseling  Your health care provider  may ask you questions about your:  Alcohol use.  Tobacco use.  Drug use.  Emotional well-being.  Home and relationship well-being.  Sexual activity.  Eating habits.  History of falls.  Memory and ability to understand (cognition).  Work and work Statistician.  Reproductive health. Screening  You may have the following tests or measurements:  Height, weight, and BMI.  Blood pressure.  Lipid and cholesterol levels. These may be checked every 5 years, or more frequently if you are over 32 years old.  Skin check.  Lung cancer screening. You may have this screening every year starting at age 56 if you have a 30-pack-year history of smoking and currently smoke or have quit within the past 15 years.  Fecal occult blood test (FOBT) of the stool. You may have this test every year starting at age 11.  Flexible sigmoidoscopy or colonoscopy. You may have a sigmoidoscopy every 5 years or a colonoscopy every 10 years starting at age 61.  Hepatitis C blood test.  Hepatitis B blood test.  Sexually transmitted disease (STD) testing.  Diabetes screening. This is done by checking your blood sugar (glucose) after you have not eaten for a while (fasting). You may have this done every 1-3 years.  Bone density scan. This is done to screen for osteoporosis. You may have this done starting at age 32.  Mammogram. This may be done every 1-2 years. Talk to your health care provider about how often you should have regular mammograms. Talk with your health care provider about your test results, treatment options, and if necessary, the need for more tests. Vaccines  Your health care provider may recommend certain vaccines, such as:  Influenza vaccine. This is recommended  every year.  Tetanus, diphtheria, and acellular pertussis (Tdap, Td) vaccine. You may need a Td booster every 10 years.  Zoster vaccine. You may need this after age 43.  Pneumococcal 13-valent conjugate (PCV13) vaccine.  One dose is recommended after age 83.  Pneumococcal polysaccharide (PPSV23) vaccine. One dose is recommended after age 39. Talk to your health care provider about which screenings and vaccines you need and how often you need them. This information is not intended to replace advice given to you by your health care provider. Make sure you discuss any questions you have with your health care provider. Document Released: 02/12/2015 Document Revised: 10/06/2015 Document Reviewed: 11/17/2014 Elsevier Interactive Patient Education  2017 [place] Prevention in the Home Falls can cause injuries. They can happen to people of all ages. There are many things you can do to make your home safe and to help prevent falls. What can I do on the outside of my home?  Regularly fix the edges of walkways and driveways and fix any cracks.  Remove anything that might make you trip as you walk through a door, such as a raised step or threshold.  Trim any bushes or trees on the path to your home.  Use bright outdoor lighting.  Clear any walking paths of anything that might make someone trip, such as rocks or tools.  Regularly check to see if handrails are loose or broken. Make sure that both sides of any steps have handrails.  Any raised decks and porches should have guardrails on the edges.  Have any leaves, snow, or ice cleared regularly.  Use sand or salt on walking paths during winter.  Clean up any spills in your garage right away. This includes oil or grease spills. What can I do in the bathroom?  Use night lights.  Install grab bars by the toilet and in the tub and shower. Do not use towel bars as grab bars.  Use non-skid mats or decals in the tub or shower.  If you need to sit down in the shower, use a plastic, non-slip stool.  Keep the floor dry. Clean up any water that spills on the floor as soon as it happens.  Remove soap buildup in the tub or shower regularly.  Attach bath  mats securely with double-sided non-slip rug tape.  Do not have throw rugs and other things on the floor that can make you trip. What can I do in the bedroom?  Use night lights.  Make sure that you have a light by your bed that is easy to reach.  Do not use any sheets or blankets that are too big for your bed. They should not hang down onto the floor.  Have a firm chair that has side arms. You can use this for support while you get dressed.  Do not have throw rugs and other things on the floor that can make you trip. What can I do in the kitchen?  Clean up any spills right away.  Avoid walking on wet floors.  Keep items that you use a lot in easy-to-reach places.  If you need to reach something above you, use a strong step stool that has a grab bar.  Keep electrical cords out of the way.  Do not use floor polish or wax that makes floors slippery. If you must use wax, use non-skid floor wax.  Do not have throw rugs and other things on the floor that can make you trip. What  can I do with my stairs?  Do not leave any items on the stairs.  Make sure that there are handrails on both sides of the stairs and use them. Fix handrails that are broken or loose. Make sure that handrails are as long as the stairways.  Check any carpeting to make sure that it is firmly attached to the stairs. Fix any carpet that is loose or worn.  Avoid having throw rugs at the top or bottom of the stairs. If you do have throw rugs, attach them to the floor with carpet tape.  Make sure that you have a light switch at the top of the stairs and the bottom of the stairs. If you do not have them, ask someone to add them for you. What else can I do to help prevent falls?  Wear shoes that:  Do not have high heels.  Have rubber bottoms.  Are comfortable and fit you well.  Are closed at the toe. Do not wear sandals.  If you use a stepladder:  Make sure that it is fully opened. Do not climb a closed  stepladder.  Make sure that both sides of the stepladder are locked into place.  Ask someone to hold it for you, if possible.  Clearly mark and make sure that you can see:  Any grab bars or handrails.  First and last steps.  Where the edge of each step is.  Use tools that help you move around (mobility aids) if they are needed. These include:  Canes.  Walkers.  Scooters.  Crutches.  Turn on the lights when you go into a dark area. Replace any light bulbs as soon as they burn out.  Set up your furniture so you have a clear path. Avoid moving your furniture around.  If any of your floors are uneven, fix them.  If there are any pets around you, be aware of where they are.  Review your medicines with your doctor. Some medicines can make you feel dizzy. This can increase your chance of falling. Ask your doctor what other things that you can do to help prevent falls. This information is not intended to replace advice given to you by your health care provider. Make sure you discuss any questions you have with your health care provider. Document Released: 11/12/2008 Document Revised: 06/24/2015 Document Reviewed: 02/20/2014 Elsevier Interactive Patient Education  2017 [employer].

## 2018-05-28 ENCOUNTER — Other Ambulatory Visit: Payer: Self-pay | Admitting: Family Medicine

## 2018-06-04 ENCOUNTER — Other Ambulatory Visit: Payer: Self-pay | Admitting: Family Medicine

## 2018-06-14 ENCOUNTER — Other Ambulatory Visit: Payer: Self-pay

## 2018-06-14 MED ORDER — ATORVASTATIN CALCIUM 20 MG PO TABS: 20.0000 mg | ORAL_TABLET | Freq: Every day | ORAL | 1 refills | Status: DC

## 2018-06-14 MED ORDER — LISINOPRIL 20 MG PO TABS: 20.0000 mg | ORAL_TABLET | Freq: Every day | ORAL | 1 refills | Status: DC

## 2018-06-14 NOTE — Telephone Encounter (Signed)
[facility] faxed a Rx refill request on Lisinopril tab 20 mg and Lipitor tab 20mg 

## 2018-07-11 ENCOUNTER — Other Ambulatory Visit: Payer: Self-pay

## 2018-07-11 ENCOUNTER — Ambulatory Visit (INDEPENDENT_AMBULATORY_CARE_PROVIDER_SITE_OTHER): Payer: Medicare Other | Admitting: Family Medicine

## 2018-07-11 ENCOUNTER — Encounter: Payer: Self-pay | Admitting: Family Medicine

## 2018-07-11 VITALS — BP 123/70 | HR 78 | Temp 98.2°F | Ht 67.5 in | Wt 134.0 lb

## 2018-07-11 DIAGNOSIS — Z0000 Encounter for general adult medical examination without abnormal findings: Secondary | ICD-10-CM | POA: Diagnosis not present

## 2018-07-11 DIAGNOSIS — E034 Atrophy of thyroid (acquired): Secondary | ICD-10-CM | POA: Diagnosis not present

## 2018-07-11 DIAGNOSIS — I10 Essential (primary) hypertension: Secondary | ICD-10-CM

## 2018-07-11 DIAGNOSIS — Z720 Tobacco use: Secondary | ICD-10-CM

## 2018-07-11 DIAGNOSIS — R82991 Hypocitraturia: Secondary | ICD-10-CM | POA: Diagnosis not present

## 2018-07-11 DIAGNOSIS — E782 Mixed hyperlipidemia: Secondary | ICD-10-CM | POA: Diagnosis not present

## 2018-07-11 DIAGNOSIS — I739 Peripheral vascular disease, unspecified: Secondary | ICD-10-CM

## 2018-07-11 DIAGNOSIS — J449 Chronic obstructive pulmonary disease, unspecified: Secondary | ICD-10-CM

## 2018-07-11 MED ORDER — CLOPIDOGREL BISULFATE 75 MG PO TABS: 75.0000 mg | ORAL_TABLET | Freq: Every day | ORAL | 3 refills | Status: DC

## 2018-07-11 MED ORDER — BUDESONIDE-FORMOTEROL FUMARATE 160-4.5 MCG/ACT IN AERO: 2.0000 | INHALATION_SPRAY | Freq: Two times a day (BID) | RESPIRATORY_TRACT | 12 refills | Status: DC

## 2018-07-11 MED ORDER — LISINOPRIL 20 MG PO TABS: 20.0000 mg | ORAL_TABLET | Freq: Every day | ORAL | 1 refills | Status: DC

## 2018-07-11 MED ORDER — FERROUS SULFATE 325 (65 FE) MG PO TBEC: 325.0000 mg | DELAYED_RELEASE_TABLET | Freq: Every day | ORAL | 4 refills | Status: DC

## 2018-07-11 NOTE — Assessment & Plan Note (Signed)
Will keep BP and cholesterol under good control. Continue to monitor. Call with any concerns.

## 2018-07-11 NOTE — Assessment & Plan Note (Signed)
Under good control on current regimen. Continue current regimen. Continue to monitor. Call with any concerns. Refills given. Labs checked today.  

## 2018-07-11 NOTE — Patient Instructions (Signed)
Health Maintenance for Postmenopausal Women Menopause is a normal process in which your reproductive ability comes to an end. This process happens gradually over a span of months to years, usually between the ages of 24 and 42. Menopause is complete when you have missed 12 consecutive menstrual periods. It is important to talk with your health care provider about some of the most common conditions that affect postmenopausal women, such as heart disease, cancer, and bone loss (osteoporosis). Adopting a healthy lifestyle and getting preventive care can help to promote your health and wellness. Those actions can also lower your chances of developing some of these common conditions. What should I know about menopause? During menopause, you may experience a number of symptoms, such as:  Moderate-to-severe hot flashes.  Night sweats.  Decrease in sex drive.  Mood swings.  Headaches.  Tiredness.  Irritability.  Memory problems.  Insomnia. Choosing to treat or not to treat menopausal changes is an individual decision that you make with your health care provider. What should I know about hormone replacement therapy and supplements? Hormone therapy products are effective for treating symptoms that are associated with menopause, such as hot flashes and night sweats. Hormone replacement carries certain risks, especially as you become older. If you are thinking about using estrogen or estrogen with progestin treatments, discuss the benefits and risks with your health care provider. What should I know about heart disease and stroke? Heart disease, heart attack, and stroke become more likely as you age. This may be due, in part, to the hormonal changes that your body experiences during menopause. These can affect how your body processes dietary fats, triglycerides, and cholesterol. Heart attack and stroke are both medical emergencies. There are many things that you can do to help prevent heart disease  and stroke:  Have your blood pressure checked at least every 1-2 years. High blood pressure causes heart disease and increases the risk of stroke.  If you are 28-21 years old, ask your health care provider if you should take aspirin to prevent a heart attack or a stroke.  Do not use any tobacco products, including cigarettes, chewing tobacco, or electronic cigarettes. If you need help quitting, ask your health care provider.  It is important to eat a healthy diet and maintain a healthy weight. ? Be sure to include plenty of vegetables, fruits, low-fat dairy products, and lean protein. ? Avoid eating foods that are high in solid fats, added sugars, or salt (sodium).  Get regular exercise. This is one of the most important things that you can do for your health. ? Try to exercise for at least 150 minutes each week. The type of exercise that you do should increase your heart rate and make you sweat. This is known as moderate-intensity exercise. ? Try to do strengthening exercises at least twice each week. Do these in addition to the moderate-intensity exercise.  Know your numbers.Ask your health care provider to check your cholesterol and your blood glucose. Continue to have your blood tested as directed by your health care provider.  What should I know about cancer screening? There are several types of cancer. Take the following steps to reduce your risk and to catch any cancer development as early as possible. Breast Cancer  Practice breast self-awareness. ? This means understanding how your breasts normally appear and feel. ? It also means doing regular breast self-exams. Let your health care provider know about any changes, no matter how small.  If you are 40 or  older, have a clinician do a breast exam (clinical breast exam or CBE) every year. Depending on your age, family history, and medical history, it may be recommended that you also have a yearly breast X-ray (mammogram).  If you  have a family history of breast cancer, talk with your health care provider about genetic screening.  If you are at high risk for breast cancer, talk with your health care provider about having an MRI and a mammogram every year.  Breast cancer (BRCA) gene test is recommended for women who have family members with BRCA-related cancers. Results of the assessment will determine the need for genetic counseling and BRCA1 and for BRCA2 testing. BRCA-related cancers include these types: ? Breast. This occurs in males or females. ? Ovarian. ? Tubal. This may also be called fallopian tube cancer. ? Cancer of the abdominal or pelvic lining (peritoneal cancer). ? Prostate. ? Pancreatic. Cervical, Uterine, and Ovarian Cancer Your health care provider may recommend that you be screened regularly for cancer of the pelvic organs. These include your ovaries, uterus, and vagina. This screening involves a pelvic exam, which includes checking for microscopic changes to the surface of your cervix (Pap test).  For women ages 21-65, health care providers may recommend a pelvic exam and a Pap test every three years. For women ages 39-65, they may recommend the Pap test and pelvic exam, combined with testing for human papilloma virus (HPV), every five years. Some types of HPV increase your risk of cervical cancer. Testing for HPV may also be done on women of any age who have unclear Pap test results.  Other health care providers may not recommend any screening for nonpregnant women who are considered low risk for pelvic cancer and have no symptoms. Ask your health care provider if a screening pelvic exam is right for you.  If you have had past treatment for cervical cancer or a condition that could lead to cancer, you need Pap tests and screening for cancer for at least 20 years after your treatment. If Pap tests have been discontinued for you, your risk factors (such as having a new sexual partner) need to be reassessed  to determine if you should start having screenings again. Some women have medical problems that increase the chance of getting cervical cancer. In these cases, your health care provider may recommend that you have screening and Pap tests more often.  If you have a family history of uterine cancer or ovarian cancer, talk with your health care provider about genetic screening.  If you have vaginal bleeding after reaching menopause, tell your health care provider.  There are currently no reliable tests available to screen for ovarian cancer. Lung Cancer Lung cancer screening is recommended for adults 57-50 years old who are at high risk for lung cancer because of a history of smoking. A yearly low-dose CT scan of the lungs is recommended if you:  Currently smoke.  Have a history of at least 30 pack-years of smoking and you currently smoke or have quit within the past 15 years. A pack-year is smoking an average of one pack of cigarettes per day for one year. Yearly screening should:  Continue until it has been 15 years since you quit.  Stop if you develop a health problem that would prevent you from having lung cancer treatment. Colorectal Cancer  This type of cancer can be detected and can often be prevented.  Routine colorectal cancer screening usually begins at age 12 and continues through  age 64.  If you have risk factors for colon cancer, your health care provider may recommend that you be screened at an earlier age.  If you have a family history of colorectal cancer, talk with your health care provider about genetic screening.  Your health care provider may also recommend using home test kits to check for hidden blood in your stool.  A small camera at the end of a tube can be used to examine your colon directly (sigmoidoscopy or colonoscopy). This is done to check for the earliest forms of colorectal cancer.  Direct examination of the colon should be repeated every 5-10 years until  age 11. However, if early forms of precancerous polyps or small growths are found or if you have a family history or genetic risk for colorectal cancer, you may need to be screened more often. Skin Cancer  Check your skin from head to toe regularly.  Monitor any moles. Be sure to tell your health care provider: ? About any new moles or changes in moles, especially if there is a change in a mole's shape or color. ? If you have a mole that is larger than the size of a pencil eraser.  If any of your family members has a history of skin cancer, especially at a young age, talk with your health care provider about genetic screening.  Always use sunscreen. Apply sunscreen liberally and repeatedly throughout the day.  Whenever you are outside, protect yourself by wearing long sleeves, pants, a wide-brimmed hat, and sunglasses. What should I know about osteoporosis? Osteoporosis is a condition in which bone destruction happens more quickly than new bone creation. After menopause, you may be at an increased risk for osteoporosis. To help prevent osteoporosis or the bone fractures that can happen because of osteoporosis, the following is recommended:  If you are 73-12 years old, get at least 1,000 mg of calcium and at least 600 mg of vitamin D per day.  If you are older than age 55 but younger than age 3, get at least 1,200 mg of calcium and at least 600 mg of vitamin D per day.  If you are older than age 57, get at least 1,200 mg of calcium and at least 800 mg of vitamin D per day. Smoking and excessive alcohol intake increase the risk of osteoporosis. Eat foods that are rich in calcium and vitamin D, and do weight-bearing exercises several times each week as directed by your health care provider. What should I know about how menopause affects my mental health? Depression may occur at any age, but it is more common as you become older. Common symptoms of depression include:  Low or sad mood.   Changes in sleep patterns.  Changes in appetite or eating patterns.  Feeling an overall lack of motivation or enjoyment of activities that you previously enjoyed.  Frequent crying spells. Talk with your health care provider if you think that you are experiencing depression. What should I know about immunizations? It is important that you get and maintain your immunizations. These include:  Tetanus, diphtheria, and pertussis (Tdap) booster vaccine.  Influenza every year before the flu season begins.  Pneumonia vaccine.  Shingles vaccine. Your health care provider may also recommend other immunizations. This information is not intended to replace advice given to you by your health care provider. Make sure you discuss any questions you have with your health care provider. Document Released: 03/10/2005 Document Revised: 08/06/2015 Document Reviewed: 10/20/2014 Elsevier Interactive Patient Education  2019 [place].

## 2018-07-11 NOTE — Assessment & Plan Note (Signed)
Rechecking levels today. Await results and will treat as needed. Call with any concerns.

## 2018-07-11 NOTE — Assessment & Plan Note (Signed)
Dizzy on the atorvastatin. Will check labs and see how she's doing. If not under good control will consider starting crestor or lower potency statin. Await results.

## 2018-07-11 NOTE — Assessment & Plan Note (Signed)
Not interested in quiting at this time. Continue to monitor. Call with any concerns.

## 2018-07-11 NOTE — Progress Notes (Signed)
BP 123/70   Pulse 78   Temp 98.2 F (36.8 C) (Oral)   Ht 5' 7.5" (1.715 m)   Wt 134 lb (60.8 kg)   SpO2 100%   BMI 20.68 kg/m    Subjective:    Patient ID: Mary Hoover, female    DOB: 21-Sep-1945, 73 y.o.   MRN: [id number]  HPI: Mary Hoover is a 73 y.o. female presenting on 07/11/2018 for comprehensive medical examination. Current medical complaints include:  HYPERTENSION / HYPERLIPIDEMIA Satisfied with current treatment? yes Duration of hypertension: chronic BP monitoring frequency: not checking BP medication side effects: no Past BP meds: lisinopril Duration of hyperlipidemia: chronic Cholesterol medication side effects: yes- dizzy on atorvastatin Cholesterol supplements: none Past cholesterol medications: atorvastain (lipitor) Medication compliance: good compliance Aspirin: yes Recent stressors: no Recurrent headaches: no Visual changes: no Palpitations: no Dyspnea: no Chest pain: no Lower extremity edema: no Dizzy/lightheaded: no  HYPOTHYROIDISM Thyroid control status:controlled Satisfied with current treatment? yes Medication side effects: no Medication compliance: excellent compliance Recent dose adjustment:no Fatigue: no Cold intolerance: no Heat intolerance: no Weight gain: no Weight loss: no Constipation: no Diarrhea/loose stools: no Palpitations: no Lower extremity edema: no Anxiety/depressed mood: no  Menopausal Symptoms: no  Depression Screen done today and results listed below:  Depression screen [facility] 2/9 07/11/2018 04/25/2018 04/09/2017 01/11/2016 07/13/2015  Decreased Interest 0 0 0 0 0  Down, Depressed, Hopeless 0 0 0 0 0  PHQ - 2 Score 0 0 0 0 0  Altered sleeping 0 - - - -  Tired, decreased energy 0 - - - -  Change in appetite 0 - - - -  Feeling bad or failure about yourself  0 - - - -  Trouble concentrating 0 - - - -  Moving slowly or fidgety/restless 0 - - - -  Suicidal thoughts 0 - - - -  PHQ-9 Score 0 - - - -  Difficult  doing work/chores Not difficult at all - - - -    Past Medical History:  Past Medical History:  Diagnosis Date  . Carotid atherosclerosis   . Carotid bruit   . COPD (chronic obstructive pulmonary disease) (HCC)    emphysema  . Hyperlipidemia   . Hypertension   . Hypothyroidism   . Infected cat bite 1980s   was hospitalized  . Tobacco use     Surgical History:  Past Surgical History:  Procedure Laterality Date  . CAROTID ENDARTERECTOMY Right Oct. 2015   Dr. Lucky Cowboy  . CAROTID STENOSIS Left April 2016   carotid stenosis surgery  . COLONOSCOPY WITH PROPOFOL N/A 11/08/2016   Procedure: COLONOSCOPY WITH PROPOFOL;  Surgeon: Jonathon Bellows, MD;  Location: [facility] ENDOSCOPY;  Service: Gastroenterology;  Laterality: N/A;  . ESOPHAGOGASTRODUODENOSCOPY (EGD) WITH PROPOFOL N/A 11/08/2016   Procedure: ESOPHAGOGASTRODUODENOSCOPY (EGD) WITH PROPOFOL;  Surgeon: Jonathon Bellows, MD;  Location: [facility] ENDOSCOPY;  Service: Gastroenterology;  Laterality: N/A;  . FLEXIBLE BRONCHOSCOPY N/A 12/17/2014   Procedure: FLEXIBLE BRONCHOSCOPY;  Surgeon: Wilhelmina Mcardle, MD;  Location: [facility] ORS;  Service: Pulmonary;  Laterality: N/A;  . INCISION AND DRAINAGE / EXCISION THYROGLOSSAL CYST  April 2011    Medications:  Current Outpatient Medications on File Prior to Visit  Medication Sig  . ASPIRIN 81 PO Take by mouth daily. 81 mg tablet  . Ferrous Sulfate (IRON PO) Take by mouth daily. 65 mg tablet  . levothyroxine (SYNTHROID, LEVOTHROID) 75 MCG tablet Take 1 tablet (75 mcg total) by mouth daily.  . vitamin B-12 (  CYANOCOBALAMIN) 500 MCG tablet Take 500 mcg by mouth daily.  . Multiple Vitamin (MULTIVITAMIN) tablet Take 1 tablet by mouth daily.   No current facility-administered medications on file prior to visit.     Allergies:  Allergies  Allergen Reactions  . Atorvastatin Other (See Comments)    dizziness    Social History:  Social History   Socioeconomic History  . Marital status: Married    Spouse name:  Not on file  . Number of children: Not on file  . Years of education: Not on file  . Highest education level: 9th grade  Occupational History  . Occupation: retired   Scientific laboratory technician  . Financial resource strain: Not hard at all  . Food insecurity    Worry: Never true    Inability: Never true  . Transportation needs    Medical: No    Non-medical: No  Tobacco Use  . Smoking status: Current Every Day Smoker    Packs/day: 1.50    Years: 50.00    Pack years: 75.00    Types: Cigarettes  . Smokeless tobacco: Never Used  Substance and Sexual Activity  . Alcohol use: No    Alcohol/week: 0.0 standard drinks  . Drug use: No  . Sexual activity: Yes  Lifestyle  . Physical activity    Days per week: 0 days    Minutes per session: 0 min  . Stress: Not at all  Relationships  . Social connections    Talks on phone: More than three times a week    Gets together: Twice a week    Attends religious service: Never    Active member of club or organization: No    Attends meetings of clubs or organizations: Never    Relationship status: Married  . Intimate partner violence    Fear of current or ex partner: No    Emotionally abused: No    Physically abused: No    Forced sexual activity: No  Other Topics Concern  . Not on file  Social History Narrative   Lives at home with husband   Social History   Tobacco Use  Smoking Status Current Every Day Smoker  . Packs/day: 1.50  . Years: 50.00  . Pack years: 75.00  . Types: Cigarettes  Smokeless Tobacco Never Used   Social History   Substance and Sexual Activity  Alcohol Use No  . Alcohol/week: 0.0 standard drinks    Family History:  Family History  Problem Relation Age of Onset  . Congestive Heart Failure Mother   . Heart disease Mother   . Hypertension Mother   . Hypertension Sister   . Diabetes Sister   . Heart disease Sister   . Cancer Brother        liver, lung  . Heart disease Maternal Grandmother   . Heart disease  Maternal Uncle   . Stroke Maternal Grandfather   . Heart disease Brother   . Hypertension Brother   . COPD Neg Hx     Past medical history, surgical history, medications, allergies, family history and social history reviewed with patient today and changes made to appropriate areas of the chart.   Review of Systems  Constitutional: Negative.   HENT: Positive for hearing loss. Negative for congestion, ear discharge, ear pain, nosebleeds, sinus pain, sore throat and tinnitus.   Eyes: Negative.   Respiratory: Positive for cough. Negative for hemoptysis, sputum production, shortness of breath, wheezing and stridor.   Cardiovascular: Negative.   Gastrointestinal: Positive  for diarrhea. Negative for abdominal pain, blood in stool, constipation, heartburn, melena, nausea and vomiting.  Genitourinary: Negative.   Musculoskeletal: Negative.   Skin: Negative.   Neurological: Negative.   Endo/Heme/Allergies: Negative for environmental allergies and polydipsia. Bruises/bleeds easily.  Psychiatric/Behavioral: Negative.     All other ROS negative except what is listed above and in the HPI.      Objective:    BP 123/70   Pulse 78   Temp 98.2 F (36.8 C) (Oral)   Ht 5' 7.5" (1.715 m)   Wt 134 lb (60.8 kg)   SpO2 100%   BMI 20.68 kg/m   Wt Readings from Last 3 Encounters:  07/11/18 134 lb (60.8 kg)  10/16/17 138 lb 4.8 oz (62.7 kg)  04/09/17 148 lb (67.1 kg)    Physical Exam Vitals signs and nursing note reviewed.  Constitutional:      General: She is not in acute distress.    Appearance: Normal appearance. She is not ill-appearing, toxic-appearing or diaphoretic.  HENT:     Head: Normocephalic and atraumatic.     Right Ear: Tympanic membrane, ear canal and external ear normal. There is no impacted cerumen.     Left Ear: Tympanic membrane, ear canal and external ear normal. There is no impacted cerumen.     Nose: Nose normal. No congestion or rhinorrhea.     Mouth/Throat:      Mouth: Mucous membranes are moist.     Pharynx: Oropharynx is clear. No oropharyngeal exudate or posterior oropharyngeal erythema.  Eyes:     General: No scleral icterus.       Right eye: No discharge.        Left eye: No discharge.     Extraocular Movements: Extraocular movements intact.     Conjunctiva/sclera: Conjunctivae normal.     Pupils: Pupils are equal, round, and reactive to light.  Neck:     Musculoskeletal: Normal range of motion and neck supple. No neck rigidity or muscular tenderness.     Vascular: No carotid bruit.  Cardiovascular:     Rate and Rhythm: Normal rate and regular rhythm.     Pulses: Normal pulses.     Heart sounds: No murmur. No friction rub. No gallop.   Pulmonary:     Effort: Pulmonary effort is normal. No respiratory distress.     Breath sounds: Normal breath sounds. No stridor. No wheezing, rhonchi or rales.  Chest:     Chest wall: No tenderness.  Abdominal:     General: Abdomen is flat. Bowel sounds are normal. There is no distension.     Palpations: Abdomen is soft. There is no mass.     Tenderness: There is no abdominal tenderness. There is no right CVA tenderness, left CVA tenderness, guarding or rebound.     Hernia: No hernia is present.  Genitourinary:    Comments: Breast and pelvic exams deferred with shared decision making Musculoskeletal:        General: No swelling, tenderness, deformity or signs of injury.     Right lower leg: No edema.     Left lower leg: No edema.  Lymphadenopathy:     Cervical: No cervical adenopathy.  Skin:    General: Skin is warm and dry.     Capillary Refill: Capillary refill takes less than 2 seconds.     Coloration: Skin is not jaundiced or pale.     Findings: No bruising, erythema, lesion or rash.  Neurological:     General: No  focal deficit present.     Mental Status: She is alert and oriented to person, place, and time. Mental status is at baseline.     Cranial Nerves: No cranial nerve deficit.      Sensory: No sensory deficit.     Motor: No weakness.     Coordination: Coordination normal.     Gait: Gait normal.     Deep Tendon Reflexes: Reflexes normal.  Psychiatric:        Mood and Affect: Mood normal.        Behavior: Behavior normal.        Thought Content: Thought content normal.        Judgment: Judgment normal.     Results for orders placed or performed in visit on 01/07/18  TSH  Result Value Ref Range   TSH 0.880 0.450 - 4.500 uIU/mL      Assessment & Plan:   Problem List Items Addressed This Visit      Cardiovascular and Mediastinum   Essential hypertension    Under good control on current regimen. Continue current regimen. Continue to monitor. Call with any concerns. Refills given. Labs checked today.       Relevant Medications   ASPIRIN 81 PO   lisinopril (ZESTRIL) 20 MG tablet   Other Relevant Orders   CBC with Differential/Platelet   Comprehensive metabolic panel   Microalbumin, Urine Waived   UA/M w/rflx Culture, Routine   Peripheral vascular disease (HCC)    Will keep BP and cholesterol under good control. Continue to monitor. Call with any concerns.       Relevant Medications   ASPIRIN 81 PO   lisinopril (ZESTRIL) 20 MG tablet     Respiratory   COPD (chronic obstructive pulmonary disease) (HCC)    Under good control on current regimen. Continue current regimen. Continue to monitor. Call with any concerns. Refills given. Labs checked today.      Relevant Medications   budesonide-formoterol (SYMBICORT) 160-4.5 MCG/ACT inhaler   Other Relevant Orders   CBC with Differential/Platelet   Comprehensive metabolic panel   UA/M w/rflx Culture, Routine     Endocrine   Hypothyroidism    Rechecking levels today. Await results and will treat as needed. Call with any concerns.       Relevant Orders   CBC with Differential/Platelet   Comprehensive metabolic panel   TSH   UA/M w/rflx Culture, Routine     Other   Hyperlipidemia    Dizzy on the  atorvastatin. Will check labs and see how she's doing. If not under good control will consider starting crestor or lower potency statin. Await results.       Relevant Medications   ASPIRIN 81 PO   lisinopril (ZESTRIL) 20 MG tablet   Other Relevant Orders   CBC with Differential/Platelet   Comprehensive metabolic panel   Lipid Panel w/o Chol/HDL Ratio   UA/M w/rflx Culture, Routine   Tobacco use    Not interested in quiting at this time. Continue to monitor. Call with any concerns.       Relevant Orders   CBC with Differential/Platelet   Comprehensive metabolic panel   UA/M w/rflx Culture, Routine    Other Visit Diagnoses    Routine general medical examination at a health care facility    -  Primary   Vaccines up to date. Screening labs checked today. Colonoscopy up to date. Mammogram and DEXA refused for now. Continue diet and exercise. Call with any concern  Follow up plan: Return in about 6 months (around 01/10/2019) for follow up .   LABORATORY TESTING:  - Pap smear: not applicable  IMMUNIZATIONS:   - Tdap: Tetanus vaccination status reviewed: last tetanus booster within 10 years. - Influenza: Up to date - Pneumovax: Up to date - Prevnar: Up to date  SCREENING: -Mammogram: Refused  - Colonoscopy: Up to date  - Bone Density: Refused   PATIENT COUNSELING:   Advised to take 1 mg of folate supplement per day if capable of pregnancy.   Sexuality: Discussed sexually transmitted diseases, partner selection, use of condoms, avoidance of unintended pregnancy  and contraceptive alternatives.   Advised to avoid cigarette smoking.  I discussed with the patient that most people either abstain from alcohol or drink within safe limits (<=14/week and <=4 drinks/occasion for males, <=7/weeks and <= 3 drinks/occasion for females) and that the risk for alcohol disorders and other health effects rises proportionally with the number of drinks per week and how often a drinker  exceeds daily limits.  Discussed cessation/primary prevention of drug use and availability of treatment for abuse.   Diet: Encouraged to adjust caloric intake to maintain  or achieve ideal body weight, to reduce intake of dietary saturated fat and total fat, to limit sodium intake by avoiding high sodium foods and not adding table salt, and to maintain adequate dietary potassium and calcium preferably from fresh fruits, vegetables, and low-fat dairy products.    stressed the importance of regular exercise  Injury prevention: Discussed safety belts, safety helmets, smoke detector, smoking near bedding or upholstery.   Dental health: Discussed importance of regular tooth brushing, flossing, and dental visits.    NEXT PREVENTATIVE PHYSICAL DUE IN 1 YEAR. Return in about 6 months (around 01/10/2019) for follow up .

## 2018-07-12 ENCOUNTER — Other Ambulatory Visit: Payer: Self-pay | Admitting: Family Medicine

## 2018-07-12 ENCOUNTER — Telehealth: Payer: Self-pay | Admitting: Family Medicine

## 2018-07-12 DIAGNOSIS — E034 Atrophy of thyroid (acquired): Secondary | ICD-10-CM

## 2018-07-12 LAB — COMPREHENSIVE METABOLIC PANEL
ALT: 9 IU/L (ref 0–32)
AST: 13 IU/L (ref 0–40)
Albumin/Globulin Ratio: 2 (ref 1.2–2.2)
Albumin: 4.7 g/dL (ref 3.7–4.7)
Alkaline Phosphatase: 80 IU/L (ref 39–117)
BUN/Creatinine Ratio: 14 (ref 12–28)
BUN: 12 mg/dL (ref 8–27)
Bilirubin Total: 0.3 mg/dL (ref 0.0–1.2)
CO2: 22 mmol/L (ref 20–29)
Calcium: 9.7 mg/dL (ref 8.7–10.3)
Chloride: 94 mmol/L — ABNORMAL LOW (ref 96–106)
Creatinine, Ser: 0.86 mg/dL (ref 0.57–1.00)
GFR calc Af Amer: 79 mL/min/{1.73_m2} (ref >59)
GFR calc non Af Amer: 68 mL/min/{1.73_m2} (ref >59)
Globulin, Total: 2.4 g/dL (ref 1.5–4.5)
Glucose: 72 mg/dL (ref 65–99)
Potassium: 4.6 mmol/L (ref 3.5–5.2)
Sodium: 133 mmol/L — ABNORMAL LOW (ref 134–144)
Total Protein: 7.1 g/dL (ref 6.0–8.5)

## 2018-07-12 LAB — CBC WITH DIFFERENTIAL/PLATELET
Basophils Absolute: 0.1 10*3/uL (ref 0.0–0.2)
Basos: 1 %
EOS (ABSOLUTE): 0.1 10*3/uL (ref 0.0–0.4)
Eos: 1 %
Hematocrit: 41.7 % (ref 34.0–46.6)
Hemoglobin: 13.9 g/dL (ref 11.1–15.9)
Immature Grans (Abs): 0 10*3/uL (ref 0.0–0.1)
Immature Granulocytes: 0 %
Lymphocytes Absolute: 1.7 10*3/uL (ref 0.7–3.1)
Lymphs: 16 %
MCH: 32.4 pg (ref 26.6–33.0)
MCHC: 33.3 g/dL (ref 31.5–35.7)
MCV: 97 fL (ref 79–97)
Monocytes Absolute: 0.7 10*3/uL (ref 0.1–0.9)
Monocytes: 7 %
Neutrophils Absolute: 7.8 10*3/uL — ABNORMAL HIGH (ref 1.4–7.0)
Neutrophils: 75 %
Platelets: 323 10*3/uL (ref 150–450)
RBC: 4.29 x10E6/uL (ref 3.77–5.28)
RDW: 12.4 % (ref 11.7–15.4)
WBC: 10.3 10*3/uL (ref 3.4–10.8)

## 2018-07-12 LAB — LIPID PANEL W/O CHOL/HDL RATIO
Cholesterol, Total: 210 mg/dL — ABNORMAL HIGH (ref 100–199)
HDL: 61 mg/dL (ref >39)
LDL Calculated: 119 mg/dL — ABNORMAL HIGH (ref 0–99)
Triglycerides: 149 mg/dL (ref 0–149)
VLDL Cholesterol Cal: 30 mg/dL (ref 5–40)

## 2018-07-12 LAB — TSH: TSH: 11.04 u[IU]/mL — ABNORMAL HIGH (ref 0.450–4.500)

## 2018-07-12 MED ORDER — LEVOTHYROXINE SODIUM 100 MCG PO TABS: 100.0000 ug | ORAL_TABLET | Freq: Every day | ORAL | 3 refills | Status: DC

## 2018-07-12 NOTE — Telephone Encounter (Signed)
Message relayed to patient. Pt stated she has been taking medication daily.

## 2018-07-12 NOTE — Telephone Encounter (Signed)
Please let her know that her labs look nice and normal, but her thyroid is under-treated. Can you confirm that she has been taking her medicine daily. If she has, we will increase her synthroid to 151mcg daily. If she has not, I'd like her to take it daily and we'll recheck it in 1 month to see if it normalizes. Thanks!

## 2018-07-13 LAB — MICROSCOPIC EXAMINATION

## 2018-07-13 LAB — UA/M W/RFLX CULTURE, ROUTINE
Bilirubin, UA: NEGATIVE
Glucose, UA: NEGATIVE
Ketones, UA: NEGATIVE
Nitrite, UA: NEGATIVE
Protein,UA: NEGATIVE
Specific Gravity, UA: <1.005 — ABNORMAL LOW (ref 1.005–1.030)
Urobilinogen, Ur: 0.2 mg/dL (ref 0.2–1.0)
pH, UA: 5.5 (ref 5.0–7.5)

## 2018-07-13 LAB — URINE CULTURE, REFLEX

## 2018-07-13 LAB — MICROALBUMIN, URINE WAIVED
Creatinine, Urine Waived: 50 mg/dL (ref 10–300)
Microalb, Ur Waived: 10 mg/L (ref 0–19)

## 2018-07-28 ENCOUNTER — Other Ambulatory Visit: Payer: Self-pay | Admitting: Family Medicine

## 2018-10-16 ENCOUNTER — Other Ambulatory Visit: Payer: Self-pay

## 2018-10-16 NOTE — Patient Outreach (Signed)
[facility]) Care Management  10/16/2018  Mary Hoover 21-Nov-1945 [id number]   Medication Adherence call to Mary Hoover Telephone call to Patient regarding Medication Adherence unable to reach patient. Mary Hoover is showing past due on Atorvastatin 20 mg under Mary Hoover.   [place] Management Direct Dial [phone]  Fax [phone] [email or web address]

## 2018-11-14 ENCOUNTER — Other Ambulatory Visit: Payer: Self-pay | Admitting: Family Medicine

## 2018-11-14 NOTE — Telephone Encounter (Signed)
Forwarding medication refill request to PCP for review. 

## 2018-11-15 NOTE — Telephone Encounter (Signed)
Scheduled patient for 11/26/18 at 2:30pm, is that ok, she wanted to see you also.

## 2018-11-15 NOTE — Telephone Encounter (Signed)
Needs blood work

## 2018-11-18 NOTE — Telephone Encounter (Signed)
That's fine- can you make sure she has enough medicine to last until 10/28

## 2018-11-18 NOTE — Telephone Encounter (Signed)
Called and spoke to patient. She states she has enough until her appointment.

## 2018-11-19 ENCOUNTER — Other Ambulatory Visit: Payer: Self-pay | Admitting: Family Medicine

## 2018-11-24 ENCOUNTER — Other Ambulatory Visit: Payer: Self-pay | Admitting: Family Medicine

## 2018-11-26 ENCOUNTER — Ambulatory Visit (INDEPENDENT_AMBULATORY_CARE_PROVIDER_SITE_OTHER): Payer: Medicare Other | Admitting: Family Medicine

## 2018-11-26 ENCOUNTER — Other Ambulatory Visit: Payer: Self-pay

## 2018-11-26 ENCOUNTER — Encounter: Payer: Self-pay | Admitting: Family Medicine

## 2018-11-26 VITALS — BP 108/68 | HR 84 | Temp 98.6°F

## 2018-11-26 DIAGNOSIS — B9689 Other specified bacterial agents as the cause of diseases classified elsewhere: Secondary | ICD-10-CM | POA: Diagnosis not present

## 2018-11-26 DIAGNOSIS — N760 Acute vaginitis: Secondary | ICD-10-CM | POA: Diagnosis not present

## 2018-11-26 DIAGNOSIS — N898 Other specified noninflammatory disorders of vagina: Secondary | ICD-10-CM

## 2018-11-26 DIAGNOSIS — Z23 Encounter for immunization: Secondary | ICD-10-CM | POA: Diagnosis not present

## 2018-11-26 DIAGNOSIS — E034 Atrophy of thyroid (acquired): Secondary | ICD-10-CM | POA: Diagnosis not present

## 2018-11-26 LAB — WET PREP FOR TRICH, YEAST, CLUE
Clue Cell Exam: POSITIVE — AB
Trichomonas Exam: NEGATIVE
Yeast Exam: NEGATIVE

## 2018-11-26 MED ORDER — METRONIDAZOLE 500 MG PO TABS: 500.0000 mg | ORAL_TABLET | Freq: Two times a day (BID) | ORAL | 0 refills | Status: DC

## 2018-11-26 NOTE — Progress Notes (Signed)
BP 108/68   Pulse 84   Temp 98.6 F (37 C)   SpO2 97%    Subjective:    Patient ID: Mary Hoover, female    DOB: Jun 15, 1945, 73 y.o.   MRN: [id number]  HPI: Mary Hoover is a 73 y.o. female  Chief Complaint  Patient presents with  . Vaginal Discharge  . Hypothyroidism   HYPOTHYROIDISM Thyroid control status:stable Satisfied with current treatment? yes Medication side effects: no Medication compliance: excellent compliance Recent dose adjustment:yes Fatigue: yes Cold intolerance: no Heat intolerance: no Weight gain: no Weight loss: no Constipation: no Diarrhea/loose stools: no Palpitations: no Lower extremity edema: no Anxiety/depressed mood: no  VAGINAL DISCHARGE Duration: weeks Discharge description: white  Pruritus: yes Dysuria: no Malodorous: yes Urinary frequency: no Fevers: no Abdominal pain: no  History of sexually transmitted diseases: no Recent antibiotic use: no Context: worse  Treatments attempted: none  Relevant past medical, surgical, family and social history reviewed and updated as indicated. Interim medical history since our last visit reviewed. Allergies and medications reviewed and updated.  Review of Systems  Constitutional: Negative.   Respiratory: Negative.   Cardiovascular: Negative.   Genitourinary: Positive for vaginal discharge. Negative for decreased urine volume, difficulty urinating, dyspareunia, dysuria, enuresis, flank pain, frequency, genital sores, hematuria, menstrual problem, pelvic pain, urgency, vaginal bleeding and vaginal pain.  Musculoskeletal: Negative.   Psychiatric/Behavioral: Negative.     Per HPI unless specifically indicated above     Objective:    BP 108/68   Pulse 84   Temp 98.6 F (37 C)   SpO2 97%   Wt Readings from Last 3 Encounters:  07/11/18 134 lb (60.8 kg)  10/16/17 138 lb 4.8 oz (62.7 kg)  04/09/17 148 lb (67.1 kg)    Physical Exam Vitals signs and nursing note reviewed.   Constitutional:      General: She is not in acute distress.    Appearance: Normal appearance. She is not ill-appearing, toxic-appearing or diaphoretic.  HENT:     Head: Normocephalic and atraumatic.     Right Ear: External ear normal.     Left Ear: External ear normal.     Nose: Nose normal.     Mouth/Throat:     Mouth: Mucous membranes are moist.     Pharynx: Oropharynx is clear.  Eyes:     General: No scleral icterus.       Right eye: No discharge.        Left eye: No discharge.     Extraocular Movements: Extraocular movements intact.     Conjunctiva/sclera: Conjunctivae normal.     Pupils: Pupils are equal, round, and reactive to light.  Neck:     Musculoskeletal: Normal range of motion and neck supple.  Cardiovascular:     Rate and Rhythm: Normal rate and regular rhythm.     Pulses: Normal pulses.     Heart sounds: Normal heart sounds. No murmur. No friction rub. No gallop.   Pulmonary:     Effort: Pulmonary effort is normal. No respiratory distress.     Breath sounds: Normal breath sounds. No stridor. No wheezing, rhonchi or rales.  Chest:     Chest wall: No tenderness.  Musculoskeletal: Normal range of motion.  Skin:    General: Skin is warm and dry.     Capillary Refill: Capillary refill takes less than 2 seconds.     Coloration: Skin is not jaundiced or pale.     Findings: No bruising, erythema, lesion  or rash.  Neurological:     General: No focal deficit present.     Mental Status: She is alert and oriented to person, place, and time. Mental status is at baseline.  Psychiatric:        Mood and Affect: Mood normal.        Behavior: Behavior normal.        Thought Content: Thought content normal.        Judgment: Judgment normal.     Results for orders placed or performed in visit on 07/11/18  Microscopic Examination   URINE  Result Value Ref Range   WBC, UA 0-5 0 - 5 /hpf   RBC 0-2 0 - 2 /hpf   Epithelial Cells (non renal) 0-10 0 - 10 /hpf   Bacteria, UA  Few (A) None seen/Few  Urine Culture, Reflex   URINE  Result Value Ref Range   Urine Culture, Routine Final report    Organism ID, Bacteria Comment   CBC with Differential/Platelet  Result Value Ref Range   WBC 10.3 3.4 - 10.8 x10E3/uL   RBC 4.29 3.77 - 5.28 x10E6/uL   Hemoglobin 13.9 11.1 - 15.9 g/dL   Hematocrit 41.7 34.0 - 46.6 %   MCV 97 79 - 97 fL   MCH 32.4 26.6 - 33.0 pg   MCHC 33.3 31.5 - 35.7 g/dL   RDW 12.4 11.7 - 15.4 %   Platelets 323 150 - 450 x10E3/uL   Neutrophils 75 Not Estab. %   Lymphs 16 Not Estab. %   Monocytes 7 Not Estab. %   Eos 1 Not Estab. %   Basos 1 Not Estab. %   Neutrophils Absolute 7.8 (H) 1.4 - 7.0 x10E3/uL   Lymphocytes Absolute 1.7 0.7 - 3.1 x10E3/uL   Monocytes Absolute 0.7 0.1 - 0.9 x10E3/uL   EOS (ABSOLUTE) 0.1 0.0 - 0.4 x10E3/uL   Basophils Absolute 0.1 0.0 - 0.2 x10E3/uL   Immature Granulocytes 0 Not Estab. %   Immature Grans (Abs) 0.0 0.0 - 0.1 x10E3/uL  Comprehensive metabolic panel  Result Value Ref Range   Glucose 72 65 - 99 mg/dL   BUN 12 8 - 27 mg/dL   Creatinine, Ser 0.86 0.57 - 1.00 mg/dL   GFR calc non Af Amer 68 >59 mL/min/1.73   GFR calc Af Amer 79 >59 mL/min/1.73   BUN/Creatinine Ratio 14 12 - 28   Sodium 133 (L) 134 - 144 mmol/L   Potassium 4.6 3.5 - 5.2 mmol/L   Chloride 94 (L) 96 - 106 mmol/L   CO2 22 20 - 29 mmol/L   Calcium 9.7 8.7 - 10.3 mg/dL   Total Protein 7.1 6.0 - 8.5 g/dL   Albumin 4.7 3.7 - 4.7 g/dL   Globulin, Total 2.4 1.5 - 4.5 g/dL   Albumin/Globulin Ratio 2.0 1.2 - 2.2   Bilirubin Total 0.3 0.0 - 1.2 mg/dL   Alkaline Phosphatase 80 39 - 117 IU/L   AST 13 0 - 40 IU/L   ALT 9 0 - 32 IU/L  Lipid Panel w/o Chol/HDL Ratio  Result Value Ref Range   Cholesterol, Total 210 (H) 100 - 199 mg/dL   Triglycerides 149 0 - 149 mg/dL   HDL 61 >39 mg/dL   VLDL Cholesterol Cal 30 5 - 40 mg/dL   LDL Calculated 119 (H) 0 - 99 mg/dL  Microalbumin, Urine Waived  Result Value Ref Range   Microalb, Ur Waived 10 0 -  19 mg/L   Creatinine, Urine Waived  50 10 - 300 mg/dL   Microalb/Creat Ratio 30-300 (H) <30 mg/g  TSH  Result Value Ref Range   TSH 11.040 (H) 0.450 - 4.500 uIU/mL  UA/M w/rflx Culture, Routine   Specimen: Urine   URINE  Result Value Ref Range   Specific Gravity, UA <1.005 (L) 1.005 - 1.030   pH, UA 5.5 5.0 - 7.5   Color, UA Yellow Yellow   Appearance Ur Clear Clear   Leukocytes,UA Trace (A) Negative   Protein,UA Negative Negative/Trace   Glucose, UA Negative Negative   Ketones, UA Negative Negative   RBC, UA 1+ (A) Negative   Bilirubin, UA Negative Negative   Urobilinogen, Ur 0.2 0.2 - 1.0 mg/dL   Nitrite, UA Negative Negative   Microscopic Examination See below:    Urinalysis Reflex Comment       Assessment & Plan:   Problem List Items Addressed This Visit      Endocrine   Hypothyroidism - Primary    Rechecking levels today. Will adjust medicine as needed. Call with any concerns.       Relevant Orders   TSH    Other Visit Diagnoses    BV (bacterial vaginosis)       Will treat with flagyl. Call if not getting better or getting worse.   Relevant Medications   metroNIDAZOLE (FLAGYL) 500 MG tablet   Immunization due       Flu shot given today.   Relevant Orders   Flu Vaccine QUAD High Dose(Fluad) (Completed)   Vaginal discharge       + clue cells   Relevant Orders   WET PREP FOR [place], YEAST, CLUE       Follow up plan: No follow-ups on file.

## 2018-11-26 NOTE — Assessment & Plan Note (Signed)
Rechecking levels today. Will adjust medicine as needed. Call with any concerns.

## 2018-11-26 NOTE — Patient Instructions (Signed)
Bacterial Vaginosis  Bacterial vaginosis is a vaginal infection that occurs when the normal balance of bacteria in the vagina is disrupted. It results from an overgrowth of certain bacteria. This is the most common vaginal infection among women ages 15-44. Because bacterial vaginosis increases your risk for STIs (sexually transmitted infections), getting treated can help reduce your risk for chlamydia, gonorrhea, herpes, and HIV (human immunodeficiency virus). Treatment is also important for preventing complications in pregnant women, because this condition can cause an early (premature) delivery. What are the causes? This condition is caused by an increase in harmful bacteria that are normally present in small amounts in the vagina. However, the reason that the condition develops is not fully understood. What increases the risk? The following factors may make you more likely to develop this condition:  Having a new sexual partner or multiple sexual partners.  Having unprotected sex.  Douching.  Having an intrauterine device (IUD).  Smoking.  Drug and alcohol abuse.  Taking certain antibiotic medicines.  Being pregnant. You cannot get bacterial vaginosis from toilet seats, bedding, swimming pools, or contact with objects around you. What are the signs or symptoms? Symptoms of this condition include:  Grey or white vaginal discharge. The discharge can also be watery or foamy.  A fish-like odor with discharge, especially after sexual intercourse or during menstruation.  Itching in and around the vagina.  Burning or pain with urination. Some women with bacterial vaginosis have no signs or symptoms. How is this diagnosed? This condition is diagnosed based on:  Your medical history.  A physical exam of the vagina.  Testing a sample of vaginal fluid under a microscope to look for a large amount of bad bacteria or abnormal cells. Your health care provider may use a cotton swab or  a small wooden spatula to collect the sample. How is this treated? This condition is treated with antibiotics. These may be given as a pill, a vaginal cream, or a medicine that is put into the vagina (suppository). If the condition comes back after treatment, a second round of antibiotics may be needed. Follow these instructions at home: Medicines  Take over-the-counter and prescription medicines only as told by your health care provider.  Take or use your antibiotic as told by your health care provider. Do not stop taking or using the antibiotic even if you start to feel better. General instructions  If you have a female sexual partner, tell her that you have a vaginal infection. She should see her health care provider and be treated if she has symptoms. If you have a female sexual partner, he does not need treatment.  During treatment: ? Avoid sexual activity until you finish treatment. ? Do not douche. ? Avoid alcohol as directed by your health care provider. ? Avoid breastfeeding as directed by your health care provider.  Drink enough water and fluids to keep your urine clear or pale yellow.  Keep the area around your vagina and rectum clean. ? Wash the area daily with warm water. ? Wipe yourself from front to back after using the toilet.  Keep all follow-up visits as told by your health care provider. This is important. How is this prevented?  Do not douche.  Wash the outside of your vagina with warm water only.  Use protection when having sex. This includes latex condoms and dental dams.  Limit how many sexual partners you have. To help prevent bacterial vaginosis, it is best to have sex with just one partner (  monogamous).  Make sure you and your sexual partner are tested for STIs.  Wear cotton or cotton-lined underwear.  Avoid wearing tight pants and pantyhose, especially during summer.  Limit the amount of alcohol that you drink.  Do not use any products that contain  nicotine or tobacco, such as cigarettes and e-cigarettes. If you need help quitting, ask your health care provider.  Do not use illegal drugs. Where to find more information  Centers for Disease Control and Prevention: www.cdc.gov/std  American Sexual Health Association (ASHA): www.ashastd.org  U.[place]. Department of Health and Human Services, Office on Women's Health: www.womenshealth.gov/ or https://www.womenshealth.gov/a-z-topics/bacterial-vaginosis Contact a health care provider if:  Your symptoms do not improve, even after treatment.  You have more discharge or pain when urinating.  You have a fever.  You have pain in your abdomen.  You have pain during sex.  You have vaginal bleeding between periods. Summary  Bacterial vaginosis is a vaginal infection that occurs when the normal balance of bacteria in the vagina is disrupted.  Because bacterial vaginosis increases your risk for STIs (sexually transmitted infections), getting treated can help reduce your risk for chlamydia, gonorrhea, herpes, and HIV (human immunodeficiency virus). Treatment is also important for preventing complications in pregnant women, because the condition can cause an early (premature) delivery.  This condition is treated with antibiotic medicines. These may be given as a pill, a vaginal cream, or a medicine that is put into the vagina (suppository). This information is not intended to replace advice given to you by your health care provider. Make sure you discuss any questions you have with your health care provider. Document Released: 01/16/2005 Document Revised: 12/29/2016 Document Reviewed: 10/02/2015 Elsevier Patient Education  2020 [employer]  

## 2018-11-27 LAB — TSH: TSH: 11.2 u[IU]/mL — ABNORMAL HIGH (ref 0.450–4.500)

## 2018-11-29 ENCOUNTER — Telehealth: Payer: Self-pay | Admitting: Family Medicine

## 2018-11-29 DIAGNOSIS — E034 Atrophy of thyroid (acquired): Secondary | ICD-10-CM

## 2018-11-29 NOTE — Telephone Encounter (Signed)
Tried to call patient, no answer, unable to leave a message.

## 2018-11-29 NOTE — Telephone Encounter (Signed)
Please let Mary Hoover know that her thyroid is not under good control. Please confirm that she has been taking her thyroid medicine daily. If she has, I'm going to increase her to 179mcg and we'll recheck in 6 weeks. If she has not- make sure to take it daily and we'll recheck it in a month. Please let me know so I can adjust the medicine and put in orders. Thanks!

## 2018-11-30 ENCOUNTER — Other Ambulatory Visit: Payer: Self-pay | Admitting: Family Medicine

## 2018-11-30 NOTE — Telephone Encounter (Signed)
Requested medication (s) are due for refill today: yes  Requested medication (s) are on the active medication list: yes  Last refill:  07/12/2018  Future visit scheduled: no  Notes to clinic:  Review for dose and refill   Requested Prescriptions  Pending Prescriptions Disp Refills   levothyroxine (SYNTHROID) 75 MCG tablet [Pharmacy Med Name: LEVOTHYROXINE  75MCG  TAB] 90 tablet 3    Sig: TAKE 1 TABLET BY MOUTH  DAILY     Endocrinology:  Hypothyroid Agents Failed - 11/30/2018  9:35 PM      Failed - TSH needs to be rechecked within 3 months after an abnormal result. Refill until TSH is due.      Failed - TSH in normal range and within 360 days    TSH  Date Value Ref Range Status  11/26/2018 11.200 (H) 0.450 - 4.500 uIU/mL Final         Passed - Valid encounter within last 12 months    Recent Outpatient Visits          4 days ago Hypothyroidism due to acquired atrophy of thyroid   [place], [place], DO   4 months ago Routine general medical examination at a health care facility   [facility], [place], DO   1 year ago Essential hypertension   [place], [place], DO   1 year ago Routine general medical examination at a health care facility   [facility], [place], DO   2 years ago Acute right-sided low back pain without sciatica   [facility] [name], DO      Future Appointments            In 4 months Wynetta Emery, Barb Merino, DO [facility], [facility]

## 2018-12-02 NOTE — Telephone Encounter (Signed)
Called patient, she was unavailable, will try again.

## 2018-12-03 NOTE — Telephone Encounter (Signed)
Tried to call patient again, no answer, phone call was dropped, unable to leave a message.

## 2018-12-03 NOTE — Telephone Encounter (Signed)
Called patient and pts husband Mary Hoover answered the phone (DPR reviewed). He stated that patient is asleep so he can take the message for her. Message relayed. Husband stated that he does not know if patient is taking her thyroid medicine as prescribed so will let her know to call back to the office with that information.

## 2018-12-04 NOTE — Telephone Encounter (Signed)
Attempted to reach patient. Got a busy signal, will try to call again later.

## 2018-12-04 NOTE — Telephone Encounter (Signed)
Pt calling back and would like a call back from tiffany. Please advise

## 2018-12-05 MED ORDER — LEVOTHYROXINE SODIUM 150 MCG PO TABS: 150.0000 ug | ORAL_TABLET | Freq: Every day | ORAL | 2 refills | Status: DC

## 2018-12-05 NOTE — Telephone Encounter (Signed)
Patient states that she does take her thyroid medication every day.

## 2018-12-09 NOTE — Telephone Encounter (Signed)
Please see Mary Hoover's message. Patient takes her thyroid medication daily.

## 2018-12-09 NOTE — Telephone Encounter (Signed)
Rx sent in

## 2018-12-13 ENCOUNTER — Telehealth: Payer: Self-pay | Admitting: Family Medicine

## 2018-12-13 NOTE — Telephone Encounter (Signed)
Called patient, appt scheduled.

## 2018-12-13 NOTE — Telephone Encounter (Signed)
Pt called and states that she was being treated for vaginal discharge and finished her medication about 5 days ago.  Pt is no better and would like to know if she needs to have additional medication.

## 2018-12-13 NOTE — Telephone Encounter (Signed)
If she is no better, she will need a pelvic exam. She can do that here or with GYN. Her choice.

## 2018-12-13 NOTE — Telephone Encounter (Signed)
Routing to provider  

## 2018-12-16 ENCOUNTER — Ambulatory Visit (INDEPENDENT_AMBULATORY_CARE_PROVIDER_SITE_OTHER): Payer: Medicare Other | Admitting: Family Medicine

## 2018-12-16 ENCOUNTER — Encounter: Payer: Self-pay | Admitting: Family Medicine

## 2018-12-16 ENCOUNTER — Other Ambulatory Visit: Payer: Self-pay

## 2018-12-16 VITALS — BP 165/77 | HR 91 | Temp 98.4°F

## 2018-12-16 DIAGNOSIS — N898 Other specified noninflammatory disorders of vagina: Secondary | ICD-10-CM

## 2018-12-16 DIAGNOSIS — I10 Essential (primary) hypertension: Secondary | ICD-10-CM | POA: Diagnosis not present

## 2018-12-16 DIAGNOSIS — N760 Acute vaginitis: Secondary | ICD-10-CM

## 2018-12-16 DIAGNOSIS — N950 Postmenopausal bleeding: Secondary | ICD-10-CM | POA: Diagnosis not present

## 2018-12-16 DIAGNOSIS — B9689 Other specified bacterial agents as the cause of diseases classified elsewhere: Secondary | ICD-10-CM | POA: Diagnosis not present

## 2018-12-16 LAB — WET PREP FOR TRICH, YEAST, CLUE
Clue Cell Exam: POSITIVE — AB
Trichomonas Exam: NEGATIVE
Yeast Exam: NEGATIVE

## 2018-12-16 MED ORDER — METRONIDAZOLE 500 MG PO TABS: 500.0000 mg | ORAL_TABLET | Freq: Two times a day (BID) | ORAL | 0 refills | Status: DC

## 2018-12-16 NOTE — Progress Notes (Signed)
BP (!) 165/77   Pulse 91   Temp 98.4 F (36.9 C)   SpO2 92%    Subjective:    Patient ID: Mary Hoover, female    DOB: [birth year], 73 y.o.   MRN: [id number]  HPI: Mary Hoover is a 72 y.o. female  Chief Complaint  Patient presents with  . Vaginal Discharge   VAGINAL DISCHARGE Duration: month and a half to 2 months Discharge description: white or clear, bloody most of the time  Pruritus: no Dysuria: no Malodorous: yes Urinary frequency: yes Fevers: no Abdominal pain: no  Sexual activity: not sexually active History of sexually transmitted diseases: no Recent antibiotic use: yes Context: about the same, maybe a little better after BV treatment  Treatments attempted: flagyl  HYPERTENSION- took her medicine late today, just took it before she came Hypertension status: uncontrolled  Satisfied with current treatment? yes Duration of hypertension: chronic BP monitoring frequency:  not checking BP medication side effects:  no Medication compliance: good compliance Previous BP meds: lisinopril Aspirin: no Recurrent headaches: no Visual changes: no Palpitations: no Dyspnea: no Chest pain: no Lower extremity edema: no Dizzy/lightheaded: no  Relevant past medical, surgical, family and social history reviewed and updated as indicated. Interim medical history since our last visit reviewed. Allergies and medications reviewed and updated.  Review of Systems  Constitutional: Negative.   Respiratory: Negative.   Cardiovascular: Negative.   Genitourinary: Positive for vaginal bleeding and vaginal discharge. Negative for decreased urine volume, difficulty urinating, dyspareunia, dysuria, enuresis, flank pain, frequency, genital sores, hematuria, menstrual problem, pelvic pain, urgency and vaginal pain.  Musculoskeletal: Negative.   Neurological: Negative.   Psychiatric/Behavioral: Negative.     Per HPI unless specifically indicated above     Objective:    BP  (!) 165/77   Pulse 91   Temp 98.4 F (36.9 C)   SpO2 92%   Wt Readings from Last 3 Encounters:  07/11/18 134 lb (60.8 kg)  10/16/17 138 lb 4.8 oz (62.7 kg)  04/09/17 148 lb (67.1 kg)    Physical Exam Vitals signs and nursing note reviewed. Exam conducted with a chaperone present.  Constitutional:      General: She is not in acute distress.    Appearance: Normal appearance. She is not ill-appearing, toxic-appearing or diaphoretic.  HENT:     Head: Normocephalic and atraumatic.     Right Ear: External ear normal.     Left Ear: External ear normal.     Nose: Nose normal.     Mouth/Throat:     Mouth: Mucous membranes are moist.     Pharynx: Oropharynx is clear.  Eyes:     General: No scleral icterus.       Right eye: No discharge.        Left eye: No discharge.     Extraocular Movements: Extraocular movements intact.     Conjunctiva/sclera: Conjunctivae normal.     Pupils: Pupils are equal, round, and reactive to light.  Neck:     Musculoskeletal: Normal range of motion and neck supple.  Cardiovascular:     Rate and Rhythm: Normal rate and regular rhythm.     Pulses: Normal pulses.     Heart sounds: Normal heart sounds. No murmur. No friction rub. No gallop.   Pulmonary:     Effort: Pulmonary effort is normal. No respiratory distress.     Breath sounds: Normal breath sounds. No stridor. No wheezing, rhonchi or rales.  Chest:  Chest wall: No tenderness.  Genitourinary:    Labia:        Right: No rash, tenderness, lesion or injury.        Left: No rash, tenderness, lesion or injury.      Urethra: No prolapse, urethral pain, urethral swelling or urethral lesion.     Vagina: No signs of injury and foreign body. Bleeding present. No vaginal discharge, erythema, tenderness, lesions or prolapsed vaginal walls.     Cervix: Dilated. Cervical bleeding present.     Uterus: Normal.   Musculoskeletal: Normal range of motion.  Skin:    General: Skin is warm and dry.     Capillary  Refill: Capillary refill takes less than 2 seconds.     Coloration: Skin is not jaundiced or pale.     Findings: No bruising, erythema, lesion or rash.  Neurological:     General: No focal deficit present.     Mental Status: She is alert and oriented to person, place, and time. Mental status is at baseline.  Psychiatric:        Mood and Affect: Mood normal.        Behavior: Behavior normal.        Thought Content: Thought content normal.        Judgment: Judgment normal.     Results for orders placed or performed in visit on 11/26/18  WET PREP FOR [place], YEAST, CLUE   Specimen: Vaginal; Sterile Swab   STERILE SWAB  Result Value Ref Range   Trichomonas Exam Negative Negative   Yeast Exam Negative Negative   Clue Cell Exam Positive (A) Negative  TSH  Result Value Ref Range   TSH 11.200 (H) 0.450 - 4.500 uIU/mL      Assessment & Plan:   Problem List Items Addressed This Visit      Cardiovascular and Mediastinum   Essential hypertension    Took her medicine late today. Encouraged her to take it on time. Follow up as needed.        Other Visit Diagnoses    BV (bacterial vaginosis)    -  Primary   Will treat with flagyl. Call if not getting better or getting worse. Call with any concerns.    Relevant Medications   metroNIDAZOLE (FLAGYL) 500 MG tablet   Postmenopausal vaginal bleeding       Signigificant bleeding without sign of trauma. Will refer urgently to GYN. Call with any concerns.    Relevant Orders   Ambulatory referral to Obstetrics / Gynecology   Vaginal discharge       Significant vaginal bleeding, no sign of trauma. Will treat for BV and get into GYN   Relevant Orders   WET PREP FOR [place], YEAST, CLUE       Follow up plan: Return in about 3 weeks (around 01/06/2019).

## 2018-12-16 NOTE — Assessment & Plan Note (Signed)
Took her medicine late today. Encouraged her to take it on time. Follow up as needed.

## 2018-12-17 ENCOUNTER — Telehealth: Payer: Self-pay | Admitting: Obstetrics & Gynecology

## 2018-12-17 NOTE — Telephone Encounter (Signed)
[facility] referring for Postmenopausal vaginal bleeding. Attempt to reach patient to schedule appointment phone number on file is busy. Unable to leave voicemail to call back to be schedule

## 2018-12-19 ENCOUNTER — Other Ambulatory Visit (HOSPITAL_COMMUNITY)
Admission: RE | Admit: 2018-12-19 | Discharge: 2018-12-19 | Disposition: A | Discharge status: Home / Self Care | Payer: Medicare Other | Source: Ambulatory Visit | Attending: Obstetrics and Gynecology | Admitting: Obstetrics and Gynecology

## 2018-12-19 ENCOUNTER — Other Ambulatory Visit: Payer: Self-pay

## 2018-12-19 ENCOUNTER — Ambulatory Visit (INDEPENDENT_AMBULATORY_CARE_PROVIDER_SITE_OTHER): Payer: Medicare Other | Admitting: Obstetrics and Gynecology

## 2018-12-19 ENCOUNTER — Encounter: Payer: Self-pay | Admitting: Obstetrics and Gynecology

## 2018-12-19 VITALS — BP 136/67 | HR 90 | Ht 67.0 in | Wt 141.0 lb

## 2018-12-19 DIAGNOSIS — N950 Postmenopausal bleeding: Secondary | ICD-10-CM | POA: Diagnosis not present

## 2018-12-19 DIAGNOSIS — Z1151 Encounter for screening for human papillomavirus (HPV): Secondary | ICD-10-CM | POA: Insufficient documentation

## 2018-12-19 DIAGNOSIS — N888 Other specified noninflammatory disorders of cervix uteri: Secondary | ICD-10-CM | POA: Diagnosis not present

## 2018-12-19 DIAGNOSIS — Z780 Asymptomatic menopausal state: Secondary | ICD-10-CM | POA: Diagnosis not present

## 2018-12-19 NOTE — Progress Notes (Signed)
Obstetrics & Gynecology Office Visit    Chief Complaint  Patient presents with  . Vaginal Bleeding    discharge, postmenopausal bleeding xfew weeks   The patient is seen in referral at the request of [name], DO from [facility] for menopausal vaginal bleeding.   History of Present Illness: 73 y.o. No obstetric history on file. female who is seen in referral from [name], DO from [facility] for menopausal vaginal bleeding.  She notes a discharge for a while. She had thought that sometimes there might have been blood.  In the past few weeks she has noticed that sometimes there has been blood.  She notes the bright red blood on her panty liner.  Her last period was about 20 years ago.  She has had no bleeding since then until now.  She is sure the blood is coming from her vagina.  She has had no unintentional weight loss.  She notes some constipation.  She denies bloating. Denies early satiety. She denies abdominal pain. She does have pain in her lower back.    Health maintenance:  Colonoscopy: 2018 Mammogram: years ago, never had one that wasn't normal.   Pap smear: years ago.  They were normal.    Past Medical History:  Diagnosis Date  . Carotid atherosclerosis   . Carotid bruit   . COPD (chronic obstructive pulmonary disease) (HCC)    emphysema  . Hyperlipidemia   . Hypertension   . Hypothyroidism   . Infected cat bite 1980s   was hospitalized  . Tobacco use     Past Surgical History:  Procedure Laterality Date  . CAROTID ENDARTERECTOMY Right Oct. 2015   Dr. Lucky Cowboy  . CAROTID STENOSIS Left April 2016   carotid stenosis surgery  . COLONOSCOPY WITH PROPOFOL N/A 11/08/2016   Procedure: COLONOSCOPY WITH PROPOFOL;  Surgeon: Jonathon Bellows, MD;  Location: [facility] ENDOSCOPY;  Service: Gastroenterology;  Laterality: N/A;  . ESOPHAGOGASTRODUODENOSCOPY (EGD) WITH PROPOFOL N/A 11/08/2016   Procedure: ESOPHAGOGASTRODUODENOSCOPY (EGD) WITH PROPOFOL;   Surgeon: Jonathon Bellows, MD;  Location: [facility] ENDOSCOPY;  Service: Gastroenterology;  Laterality: N/A;  . FLEXIBLE BRONCHOSCOPY N/A 12/17/2014   Procedure: FLEXIBLE BRONCHOSCOPY;  Surgeon: Wilhelmina Mcardle, MD;  Location: [facility] ORS;  Service: Pulmonary;  Laterality: N/A;  . INCISION AND DRAINAGE / EXCISION THYROGLOSSAL CYST  April 2011    Gynecologic History: No LMP recorded. Patient is postmenopausal.  Obstetric History: No obstetric history on file.  Family History  Problem Relation Age of Onset  . Congestive Heart Failure Mother   . Heart disease Mother   . Hypertension Mother   . Hypertension Sister   . Diabetes Sister   . Heart disease Sister   . Cancer Brother        liver, lung  . Heart disease Maternal Grandmother   . Heart disease Maternal Uncle   . Stroke Maternal Grandfather   . Heart disease Brother   . Hypertension Brother   . COPD Neg Hx     Social History   Socioeconomic History  . Marital status: Married    Spouse name: Not on file  . Number of children: Not on file  . Years of education: Not on file  . Highest education level: 9th grade  Occupational History  . Occupation: retired   Scientific laboratory technician  . Financial resource strain: Not hard at all  . Food insecurity    Worry: Never true    Inability: Never true  . Transportation  needs    Medical: No    Non-medical: No  Tobacco Use  . Smoking status: Current Every Day Smoker    Packs/day: 1.50    Years: 50.00    Pack years: 75.00    Types: Cigarettes  . Smokeless tobacco: Never Used  Substance and Sexual Activity  . Alcohol use: No    Alcohol/week: 0.0 standard drinks  . Drug use: No  . Sexual activity: Yes  Lifestyle  . Physical activity    Days per week: 0 days    Minutes per session: 0 min  . Stress: Not at all  Relationships  . Social connections    Talks on phone: More than three times a week    Gets together: Twice a week    Attends religious service: Never    Active member of club or  organization: No    Attends meetings of clubs or organizations: Never    Relationship status: Married  . Intimate partner violence    Fear of current or ex partner: No    Emotionally abused: No    Physically abused: No    Forced sexual activity: No  Other Topics Concern  . Not on file  Social History Narrative   Lives at home with husband    Allergies  Allergen Reactions  . Atorvastatin Other (See Comments)    dizziness    Prior to Admission medications   Medication Sig Start Date End Date Taking? Authorizing Provider  ASPIRIN 81 PO Take by mouth daily. 81 mg tablet    [provider]  budesonide-formoterol (SYMBICORT) 160-4.5 MCG/ACT inhaler Inhale 2 puffs into the lungs 2 (two) times daily. 07/11/18   Park Liter P, DO  clopidogrel (PLAVIX) 75 MG tablet Take 1 tablet (75 mg total) by mouth daily. 07/11/18   Park Liter P, DO  ferrous sulfate 325 (65 FE) MG EC tablet Take 1 tablet (325 mg total) by mouth daily with breakfast. 07/11/18   Park Liter P, DO  levothyroxine (SYNTHROID) 150 MCG tablet Take 1 tablet (150 mcg total) by mouth daily. 12/05/18   Johnson, Megan P, DO  lisinopril (ZESTRIL) 20 MG tablet TAKE 1 TABLET BY MOUTH  DAILY 11/25/18   Johnson, Megan P, DO  metroNIDAZOLE (FLAGYL) 500 MG tablet Take 1 tablet (500 mg total) by mouth 2 (two) times daily. 12/16/18   Park Liter P, DO  Multiple Vitamin (MULTIVITAMIN) tablet Take 1 tablet by mouth daily.    [provider]  vitamin B-12 (CYANOCOBALAMIN) 500 MCG tablet Take 500 mcg by mouth daily.    [provider]    Review of Systems  Constitutional: Negative.   HENT: Negative.   Eyes: Negative.   Respiratory: Negative.   Cardiovascular: Negative.   Gastrointestinal: Negative.   Genitourinary: Negative.   Musculoskeletal: Negative.   Skin: Negative.   Neurological: Negative.   Psychiatric/Behavioral: Negative.      Physical Exam BP 136/67 (BP Location: Left Arm, Patient  Position: Sitting, Cuff Size: Normal)   Pulse 90   Ht _0  (1.702 m)   Wt 141 lb (64 kg)   BMI 22.08 kg/m  No LMP recorded. Patient is postmenopausal. Physical Exam Constitutional:      General: She is not in acute distress.    Appearance: Normal appearance. She is well-developed.  Genitourinary:     Pelvic exam was performed with patient in the lithotomy position.     Vulva, inguinal canal, urethra, bladder, uterus, right adnexa and left adnexa normal.  No posterior fourchette tenderness, injury or lesion present.     Urethral caruncle (mild spotting when touched with Q-tip) present.     Vaginal erythema and bleeding (the cervix was quite friable. The speculum itself caused some bleeding that required silver nitrate in one location) present.     Vaginal exam comments: Friable cervix, firm to palpation extending anteriorly.     No cervical friability, lesion, bleeding or polyp.  HENT:     Head: Normocephalic and atraumatic.  Eyes:     General: No scleral icterus.    Conjunctiva/sclera: Conjunctivae normal.  Neck:     Musculoskeletal: Normal range of motion and neck supple.  Cardiovascular:     Rate and Rhythm: Normal rate and regular rhythm.     Heart sounds: No murmur. No friction rub. No gallop.   Pulmonary:     Effort: Pulmonary effort is normal. No respiratory distress.     Breath sounds: Normal breath sounds. No wheezing or rales.  Abdominal:     General: Bowel sounds are normal. There is no distension.     Palpations: Abdomen is soft. There is no mass.     Tenderness: There is no abdominal tenderness. There is no guarding or rebound.  Musculoskeletal: Normal range of motion.  Neurological:     General: No focal deficit present.     Mental Status: She is alert and oriented to person, place, and time. Mental status is at baseline.  Skin:    General: Skin is warm and dry.     Findings: No erythema.  Psychiatric:        Mood and Affect: Mood normal.        Behavior:  Behavior normal.        Judgment: Judgment normal.   Endometrial Biopsy After discussion with the patient regarding her abnormal uterine bleeding I recommended that she proceed with an endometrial biopsy for further diagnosis. The risks, benefits, alternatives, and indications for an endometrial biopsy were discussed with the patient in detail. She understood the risks including infection, bleeding, cervical laceration and uterine perforation.  Verbal consent was obtained.   PROCEDURE NOTE:  Pipelle endometrial biopsy was performed using aseptic technique with iodine preparation.  The uterus was sounded to a length of 9 cm.  Adequate sampling was obtained with minimal blood loss.  There was a large amount of exudative material within the pipelle after both passes. The patient tolerated the procedure well.     Female chaperone present for pelvic and breast  portions of the physical exam  Assessment: 73 y.o. No obstetric history on file. female here for  1. Postmenopausal bleeding   2. Friable cervix   3. Cervical mass      Plan: Problem List Items Addressed This Visit    None    Visit Diagnoses    Postmenopausal bleeding    -  Primary   Relevant Orders   US Transvaginal Non-OB [facility])   Surgical pathology   Cytology - PAP   Friable cervix       Relevant Orders   Cytology - PAP   Cervical mass       Relevant Orders   Cytology - PAP     For her postmenopausal bleeding the etiologies could be that she is having bleeding from her urethral carbuncle, which I think is much less concerning than the other findings today.  Her cervix is friable and firm to palpation.  The firmness extends anteriorly along the vaginal wall.  I  am concerned for either an inflammatory process or neoplasia.  The return of exudative material on endometrial biopsy also has me concerned for neoplasia with second on the differential being an inflammatory or infectious process.  We discussed the etiologies that could  cause postmenopausal bleeding.  I am concerned for an intrauterine process or a cervical process.  A Pap smear was obtained due to the friability of her cervix.  An endometrial biopsy was performed.  A pelvic ultrasound will be performed as well to further assess the structure of her uterus.  Prentice Docker, MD 12/19/2018 3:22 PM    CC: [name], DO [facility] [address] [place],   [id number]

## 2018-12-24 ENCOUNTER — Other Ambulatory Visit: Payer: Self-pay | Admitting: Obstetrics and Gynecology

## 2018-12-24 ENCOUNTER — Telehealth: Payer: Self-pay

## 2018-12-24 DIAGNOSIS — C539 Malignant neoplasm of cervix uteri, unspecified: Secondary | ICD-10-CM

## 2018-12-24 DIAGNOSIS — D4959 Neoplasm of unspecified behavior of other genitourinary organ: Secondary | ICD-10-CM

## 2018-12-24 LAB — SURGICAL PATHOLOGY

## 2018-12-24 LAB — CYTOLOGY - PAP
Comment: NEGATIVE
High risk HPV: NEGATIVE

## 2018-12-24 NOTE — Telephone Encounter (Signed)
Dr. Vic Ripper called with results from pt's recent pap on SDJ pt. Squamous Cell Carcinoma. I inquired about the results of the Endometrial biopsy and he could not give a result on that as of yet.   Dr. Georgianne Fick, [facility] is out of the office until next week. Please advise on what to do from here with results.

## 2018-12-24 NOTE — Progress Notes (Signed)
Called patient in Dr, Marisue Brooklyn absence.  Biopsy showing squamous cell cervical cancer.  CT A/P/C ordered. Gyn-Onc referral next week

## 2018-12-24 NOTE — Telephone Encounter (Signed)
FYI i've called patient, put in CT chest/abdomen/pelvis and gyn-onc referral

## 2018-12-26 NOTE — Telephone Encounter (Signed)
Thanks.  I was worried about this.Marland KitchenMarland Kitchen

## 2018-12-31 ENCOUNTER — Ambulatory Visit
Admission: RE | Admit: 2018-12-31 | Discharge: 2018-12-31 | Disposition: A | Discharge status: Home / Self Care | Payer: Medicare Other | Source: Ambulatory Visit | Attending: Obstetrics and Gynecology | Admitting: Obstetrics and Gynecology

## 2018-12-31 ENCOUNTER — Other Ambulatory Visit: Payer: Self-pay

## 2018-12-31 DIAGNOSIS — C539 Malignant neoplasm of cervix uteri, unspecified: Secondary | ICD-10-CM | POA: Diagnosis not present

## 2018-12-31 DIAGNOSIS — D4959 Neoplasm of unspecified behavior of other genitourinary organ: Secondary | ICD-10-CM | POA: Diagnosis not present

## 2018-12-31 LAB — POCT I-STAT CREATININE: Creatinine, Ser: 0.9 mg/dL (ref 0.44–1.00)

## 2018-12-31 MED ORDER — IOHEXOL 300 MG/ML  SOLN
100.0000 mL | Freq: Once | INTRAMUSCULAR | Status: AC | PRN
  Administered 2018-12-31: 100 mL via INTRAVENOUS

## 2019-01-01 ENCOUNTER — Inpatient Hospital Stay: Payer: Medicare Other

## 2019-01-01 ENCOUNTER — Other Ambulatory Visit: Payer: Self-pay

## 2019-01-01 ENCOUNTER — Inpatient Hospital Stay: Payer: Medicare Other | Attending: Obstetrics and Gynecology | Admitting: Obstetrics and Gynecology

## 2019-01-01 VITALS — BP 160/76 | HR 86 | Temp 98.4°F | Resp 18 | Ht 67.0 in | Wt 139.4 lb

## 2019-01-01 DIAGNOSIS — I6529 Occlusion and stenosis of unspecified carotid artery: Secondary | ICD-10-CM | POA: Diagnosis not present

## 2019-01-01 DIAGNOSIS — E039 Hypothyroidism, unspecified: Secondary | ICD-10-CM | POA: Diagnosis not present

## 2019-01-01 DIAGNOSIS — C539 Malignant neoplasm of cervix uteri, unspecified: Secondary | ICD-10-CM

## 2019-01-01 DIAGNOSIS — N950 Postmenopausal bleeding: Secondary | ICD-10-CM | POA: Diagnosis not present

## 2019-01-01 DIAGNOSIS — R911 Solitary pulmonary nodule: Secondary | ICD-10-CM | POA: Insufficient documentation

## 2019-01-01 DIAGNOSIS — Z7689 Persons encountering health services in other specified circumstances: Secondary | ICD-10-CM | POA: Diagnosis not present

## 2019-01-01 DIAGNOSIS — J439 Emphysema, unspecified: Secondary | ICD-10-CM | POA: Diagnosis not present

## 2019-01-01 DIAGNOSIS — F17210 Nicotine dependence, cigarettes, uncomplicated: Secondary | ICD-10-CM | POA: Insufficient documentation

## 2019-01-01 DIAGNOSIS — E785 Hyperlipidemia, unspecified: Secondary | ICD-10-CM | POA: Insufficient documentation

## 2019-01-01 DIAGNOSIS — Z7982 Long term (current) use of aspirin: Secondary | ICD-10-CM | POA: Diagnosis not present

## 2019-01-01 DIAGNOSIS — I10 Essential (primary) hypertension: Secondary | ICD-10-CM | POA: Insufficient documentation

## 2019-01-01 DIAGNOSIS — I739 Peripheral vascular disease, unspecified: Secondary | ICD-10-CM | POA: Insufficient documentation

## 2019-01-01 DIAGNOSIS — Z7951 Long term (current) use of inhaled steroids: Secondary | ICD-10-CM | POA: Insufficient documentation

## 2019-01-01 DIAGNOSIS — Z79899 Other long term (current) drug therapy: Secondary | ICD-10-CM | POA: Insufficient documentation

## 2019-01-01 DIAGNOSIS — R0989 Other specified symptoms and signs involving the circulatory and respiratory systems: Secondary | ICD-10-CM | POA: Insufficient documentation

## 2019-01-01 DIAGNOSIS — D4959 Neoplasm of unspecified behavior of other genitourinary organ: Secondary | ICD-10-CM

## 2019-01-01 DIAGNOSIS — J449 Chronic obstructive pulmonary disease, unspecified: Secondary | ICD-10-CM | POA: Diagnosis not present

## 2019-01-01 LAB — CBC WITH DIFFERENTIAL/PLATELET
Abs Immature Granulocytes: 0.04 10*3/uL (ref 0.00–0.07)
Basophils Absolute: 0 10*3/uL (ref 0.0–0.1)
Basophils Relative: 0 %
Eosinophils Absolute: 0.1 10*3/uL (ref 0.0–0.5)
Eosinophils Relative: 1 %
HCT: 37 % (ref 36.0–46.0)
Hemoglobin: 12.5 g/dL (ref 12.0–15.0)
Immature Granulocytes: 0 %
Lymphocytes Relative: 12 %
Lymphs Abs: 1.1 10*3/uL (ref 0.7–4.0)
MCH: 32.3 pg (ref 26.0–34.0)
MCHC: 33.8 g/dL (ref 30.0–36.0)
MCV: 95.6 fL (ref 80.0–100.0)
Monocytes Absolute: 0.5 10*3/uL (ref 0.1–1.0)
Monocytes Relative: 6 %
Neutro Abs: 7.5 10*3/uL (ref 1.7–7.7)
Neutrophils Relative %: 81 %
Platelets: 365 10*3/uL (ref 150–400)
RBC: 3.87 MIL/uL (ref 3.87–5.11)
RDW: 12.5 % (ref 11.5–15.5)
WBC: 9.3 10*3/uL (ref 4.0–10.5)
nRBC: 0 % (ref 0.0–0.2)

## 2019-01-01 LAB — COMPREHENSIVE METABOLIC PANEL
ALT: 14 U/L (ref 0–44)
AST: 16 U/L (ref 15–41)
Albumin: 4.4 g/dL (ref 3.5–5.0)
Alkaline Phosphatase: 70 U/L (ref 38–126)
Anion gap: 5 (ref 5–15)
BUN: 11 mg/dL (ref 8–23)
CO2: 24 mmol/L (ref 22–32)
Calcium: 8.8 mg/dL — ABNORMAL LOW (ref 8.9–10.3)
Chloride: 96 mmol/L — ABNORMAL LOW (ref 98–111)
Creatinine, Ser: 0.84 mg/dL (ref 0.44–1.00)
GFR calc Af Amer: >60 mL/min (ref >60)
GFR calc non Af Amer: >60 mL/min (ref >60)
Glucose, Bld: 99 mg/dL (ref 70–99)
Potassium: 4.3 mmol/L (ref 3.5–5.1)
Sodium: 125 mmol/L — ABNORMAL LOW (ref 135–145)
Total Bilirubin: 0.5 mg/dL (ref 0.3–1.2)
Total Protein: 7.9 g/dL (ref 6.5–8.1)

## 2019-01-01 NOTE — Patient Instructions (Signed)
Cervical Biopsy, Care After This sheet gives you information about how to care for yourself after your procedure. Your health care provider may also give you more specific instructions. If you have problems or questions, contact your health care provider. What can I expect after the procedure? After the procedure, it is common to have:  Cramping or mild pain for a few days.  Slight bleeding from the vagina for a few days.  Dark-colored vaginal discharge for a few days. Follow these instructions at home: Lifestyle  Do not douche until your health care provider approves.  Do not use tampons until your health care provider approves.  Do not have sex until your health care provider approves.  Return to your normal activities as told by your health care provider. Ask your health care provider what activities are safe for you. General instructions   Take over-the-counter and prescription medicines only as told by your health care provider.  Use a sanitary napkin until bleeding and discharge stop.  Keep all follow-up visits as told by your health care provider. This is important. Contact a health care provider if you have:  A fever or chills.  Bad-smelling vaginal discharge.  Itching or irritation around the vagina.  Lower abdominal pain. Get help right away if you:  Develop heavy vaginal bleeding that soaks more than one sanitary napkin an hour.  Faint.  Have severe pain in your lower abdomen. Summary  After the procedure, it is normal to have cramps, slight bleeding, and dark-colored discharge from the vagina.  After you return home, do not douche, have sex, or use a tampon until your health care provider approves.  Take over-the-counter and prescription medicines only as told by your health care provider.  Get help right away if you develop heavy bleeding, you faint, or you have severe pain in your lower abdomen. This information is not intended to replace advice  given to you by your health care provider. Make sure you discuss any questions you have with your health care provider. Document Released: 10/07/2014 Document Revised: 06/21/2017 Document Reviewed: 06/21/2017 Elsevier Patient Education  2020 [employer].

## 2019-01-01 NOTE — Progress Notes (Signed)
New patient referral from [facility]/Dr Glennon Mac for cervical cancer.

## 2019-01-01 NOTE — Progress Notes (Signed)
Gynecologic Oncology Consult Visit   Referring Provider: Dr. Livingston Diones Ob-Gyn  Chief Complaint: Probable Squamous Cell Cervical Cancer  Subjective:  Mary Hoover is a 73 y.o. female who is seen in consultation from Dr. Georgianne Fick for new diagnosis of cervical cancer.   Patient initially presented to her PCP with postmenopausal vaginal bleeding.  She was referred to Dr. Glennon Mac at [facility].  Last menstrual period around age 67.  On exam, cervix was friable and firm to palpation extending anteriorly.  Urethral caruncle also noted.  Speculum exam caused some bleeding that required silver nitrate.  Pap and endometrial biopsy were performed.  Endometrial biopsy returned some exudative material.  Findings are concerning for neoplastic process.  Pap- 12/19/2018 - Squamous cell Carcinoma - High Risk HPV - negative  12/19/2018- Endometrial biopsy - Scattered atypical squamous cells suspicious for malignancy  - Predominantly necrosis with associated inflammation   CT Chest/Abdomen/Pelvis on 12/31/2018 for staging: Fluid distending the endometrial canal and/or endometrial thickening to the level of the cervix.  No adnexal mass or free fluid seen.  Haziness about the cervix of uncertain significance.  Chronic pleuroparenchymal scarring/fibrosis at lung apices, irregular pulmonary nodule within the Right upper lobe measuring 54m, moderate emphysema.   Colonoscopy: 2018 Mammogram: She is a current smoker 1-1.5 PPD with 75-pack-year history and has emphysema. No oxygen use. Not dyspneic unless climbs a lot of stairs.  Also s/p carotid end arterectomy for carotid stenosis  Problem List: Patient Active Problem List   Diagnosis Date Noted  . Carotid atherosclerosis 11/14/2016  . Myopia of both eyes 01/11/2016  . Lung nodules   . Essential hypertension 12/12/2014  . Hemoptysis 12/11/2014  . Medication monitoring encounter 11/25/2014  . Hyperlipidemia   . Status post carotid endarterectomy    . Hypothyroidism   . Tobacco use   . COPD [place]ch[place] obstructive pulmonary disease) ([place]   . Peripheral vascular disease ([place] 09/15/2013    Past Medical History: Past Medical History:  Diagnosis Date  . Carotid atherosclerosis   . Carotid bruit   . COPD (chronic obstructive pulmonary disease) (HCC)    emphysema  . Hyperlipidemia   . Hypertension   . Hypothyroidism   . Infected cat bite 1980s   was hospitalized  . Tobacco use     Past Surgical History: Past Surgical History:  Procedure Laterality Date  . CAROTID ENDARTERECTOMY Right Oct. 2015   Dr. DLucky Cowboy . CAROTID STENOSIS Left April 2016   carotid stenosis surgery  . COLONOSCOPY WITH PROPOFOL N/A 11/08/2016   Procedure: COLONOSCOPY WITH PROPOFOL;  Surgeon: AJonathon Bellows MD;  [facility];  Service: Gastroenterology;  Laterality: N/A;  . ESOPHAGOGASTRODUODENOSCOPY (EGD) WITH PROPOFOL N/A 11/08/2016   Procedure: ESOPHAGOGASTRODUODENOSCOPY (EGD) WITH PROPOFOL;  Surgeon: AJonathon Bellows MD;  [facility];  Service: Gastroenterology;  Laterality: N/A;  . FLEXIBLE BRONCHOSCOPY N/A 12/17/2014   Procedure: FLEXIBLE BRONCHOSCOPY;  Surgeon: DWilhelmina Mcardle MD;  Location: [facility] ORS;  Service: Pulmonary;  Laterality: N/A;  . INCISION AND DRAINAGE / EXCISION THYROGLOSSAL CYST  April 2011    Past Ob/Gyn History: underwent menopause many years ago, no HRT.  Had 5 children. Not sexually active, husband had prostate cancer.      Family History: Family History  Problem Relation Age of Onset  . Congestive Heart Failure Mother   . Heart disease Mother   . Hypertension Mother   . Hypertension Sister   . Diabetes Sister   . Heart disease Sister   . Cancer Brother  liver, lung  . Heart disease Maternal Grandmother   . Heart disease Maternal Uncle   . Stroke Maternal Grandfather   . Heart disease Brother   . Hypertension Brother   . COPD Neg Hx     Social History: Social History   Socioeconomic  History  . Marital status: Married    Spouse name: Not on file  . Number of children: Not on file  . Years of education: Not on file  . Highest education level: 9th grade  Occupational History  . Occupation: retired   Scientific laboratory technician  . Financial resource strain: Not hard at all  . Food insecurity    Worry: Never true    Inability: Never true  . Transportation needs    Medical: No    Non-medical: No  Tobacco Use  . Smoking status: Current Every Day Smoker    Packs/day: 1.50    Years: 50.00    Pack years: 75.00    Types: Cigarettes  . Smokeless tobacco: Never Used  Substance and Sexual Activity  . Alcohol use: No    Alcohol/week: 0.0 standard drinks  . Drug use: No  . Sexual activity: Yes  Lifestyle  . Physical activity    Days per week: 0 days    Minutes per session: 0 min  . Stress: Not at all  Relationships  . Social connections    Talks on phone: More than three times a week    Gets together: Twice a week    Attends religious service: Never    Active member of club or organization: No    Attends meetings of clubs or organizations: Never    Relationship status: Married  . Intimate partner violence    Fear of current or ex partner: No    Emotionally abused: No    Physically abused: No    Forced sexual activity: No  Other Topics Concern  . Not on file  Social History Narrative   Lives at home with husband    Allergies: Allergies  Allergen Reactions  . Atorvastatin Other (See Comments)    dizziness    Current Medications: Current Outpatient Medications  Medication Sig Dispense Refill  . ASPIRIN 81 PO Take by mouth daily. 81 mg tablet    . budesonide-formoterol (SYMBICORT) 160-4.5 MCG/ACT inhaler Inhale 2 puffs into the lungs 2 (two) times daily. 1 Inhaler 12  . clopidogrel (PLAVIX) 75 MG tablet Take 1 tablet (75 mg total) by mouth daily. 90 tablet 3  . ferrous sulfate 325 (65 FE) MG EC tablet Take 1 tablet (325 mg total) by mouth daily with breakfast. 90  tablet 4  . levothyroxine (SYNTHROID) 150 MCG tablet Take 1 tablet (150 mcg total) by mouth daily. 30 tablet 2  . lisinopril (ZESTRIL) 20 MG tablet TAKE 1 TABLET BY MOUTH  DAILY 90 tablet 1  . metroNIDAZOLE (FLAGYL) 500 MG tablet Take 1 tablet (500 mg total) by mouth 2 (two) times daily. 14 tablet 0  . Multiple Vitamin (MULTIVITAMIN) tablet Take 1 tablet by mouth daily.    . vitamin B-12 (CYANOCOBALAMIN) 500 MCG tablet Take 500 mcg by mouth daily.     No current facility-administered medications for this visit.    Review of Systems General: negative for fevers, chills, fatigue, changes in sleep, changes in weight or appetite Skin: negative for changes in color, texture, moles or lesions Eyes: negative for changes in vision, pain, diplopia HEENT: negative for change in hearing, pain, discharge, tinnitus, vertigo, voice changes, sore  throat, neck masses Breasts: negative for breast lumps Pulmonary: negative for dyspnea, orthopnea, productive cough Cardiac: negative for palpitations, syncope, pain, discomfort, pressure Gastrointestinal: negative for dysphagia, nausea, vomiting, jaundice, pain, constipation, diarrhea, hematemesis, hematochezia Genitourinary/Sexual: negative for dysuria, discharge, hesitancy, nocturia, retention, stones, infections, STD's, incontinence Musculoskeletal: negative for pain, stiffness, swelling, range of motion limitation Hematology: negative for easy bruising, bleeding Neurologic/Psych: negative for headaches, seizures, paralysis, weakness, tremor, change in gait, change in sensation, mood swings, depression, anxiety, change in memory  Objective:  Physical Examination:  BP (!) 160/76 (BP Location: Right Arm, Patient Position: Sitting)   Pulse 86   Temp 98.4 F (36.9 C) (Tympanic)   Resp 18   Ht _0  (1.702 m)   Wt 139 lb 6.4 oz (63.2 kg)   BMI 21.83 kg/m     ECOG Performance Status: 1 - Symptomatic but completely ambulatory GENERAL: Patient is a well  appearing female in no acute distress HEENT:  PERRL, neck supple with midline trachea. Thyroid without masses.  NODES:  No cervical, supraclavicular, axillary, or inguinal lymphadenopathy palpated.  LUNGS:  Clear to auscultation bilaterally.  No wheezes or rhonchi. HEART:  Regular rate and rhythm. No murmur appreciated. ABDOMEN:  Soft, nontender.  Positive, normoactive bowel sounds.  MSK:  No focal spinal tenderness to palpation. Full range of motion bilaterally in the upper extremities. EXTREMITIES:  No peripheral edema, groins negative.   SKIN:  Clear with no obvious rashes or skin changes. No nail dyscrasia. NEURO:  Nonfocal. Well oriented.  Appropriate affect.  Pelvic: EGBUS: no lesions, urethral caruncle noted. Cervix: Flush with the vaginal vault.  It is friable with exophytic papillary tumor seen measuring about 2 cm at least.  Concern for some anterior vaginal involvement due to flush cervix.   Vagina: no lesions, no discharge or bleeding Uterus: normal size, nontender, mobile Adnexa: no palpable masses Rectovaginal: confirmatory, parametria smooth  Procedure note: Informed consent was signed and time out done.  Cervical biopsy was taken anteriorly and silver nitrate applied to stop bleeding.  Good hemostasis achieved and patient tolerated biopsy well with minimal discomfort.   Lab Review Labs on site today:  Radiologic Imaging: CT Chest/Abdomen/Pelvis on 12/31/2018 for staging: Fluid distending the endometrial canal and/or endometrial thickening to the level of the cervix.  No adnexal mass or free fluid seen.  Haziness about the cervix of uncertain significance.  Chronic pleuroparenchymal scarring/fibrosis at lung apices, irregular pulmonary nodule within the Right upper lobe measuring 21m, moderate emphysema.     Assessment:  JANE CONERLYis a 73y.o. female diagnosed with probably squamous cell cancer of the cervix, at least stage IB1.  No evidence of metastatic disease on  CT scan   Medical co-morbidities complicating care: heavy smoker/emphysema, vascular disease s/p carotid endarterectomy.  Plan:   Problem List Items Addressed This Visit    None    Visit Diagnoses    Malignant neoplasm of cervix, unspecified site ([place]    -  Primary     We discussed options for management including surgery vs chemoradiation and that cure rates are the same with the two modalities for stage I cervical cancer.  The main advantage of surgery is that it spares the ovaries and vagina.  However, she is postmenopausal and not sexually active.  In addition, surgery is not the best option because she is a heavy smoker and has significant emphysema as well as vascular disease with prior carotid endarterectomy.  We will get a PET scan to better  assess for metastatic disease and will set up an appt. with Dr Baruch Gouty in [place].  I explained the general logistics of the treatment plan for chemoradiation.      The patient's diagnosis, an outline of the further diagnostic and laboratory studies which will be required, the recommendation for surgery, and alternatives were discussed with her and her accompanying family members.  All questions were answered to their satisfaction.  A total of 60 minutes were spent with the patient/family today; 50 % was spent in education, counseling and coordination of care for cervical cancer.    Mellody Drown, MD  CC:  Malachy Mood, [address],  [place] [address] [phone]

## 2019-01-02 ENCOUNTER — Encounter: Payer: Self-pay | Admitting: Family Medicine

## 2019-01-06 ENCOUNTER — Ambulatory Visit: Payer: Medicare Other | Admitting: Obstetrics and Gynecology

## 2019-01-06 ENCOUNTER — Other Ambulatory Visit: Payer: Medicare Other

## 2019-01-06 LAB — SURGICAL PATHOLOGY

## 2019-01-07 ENCOUNTER — Telehealth: Payer: Self-pay

## 2019-01-07 NOTE — Telephone Encounter (Signed)
Call placed to Ms. Mary Hoover for Mary Rutter NP. Sodium is low, 125. She needs to have her PCP address this as it would not be related to her cervical cancer.  She has an upcoming appointment with Mary Hoover, PCP, 12/17. I have asked [name] to bring this her attention. I have also forwarded her lab results to [name].

## 2019-01-08 ENCOUNTER — Other Ambulatory Visit: Payer: Self-pay

## 2019-01-08 ENCOUNTER — Ambulatory Visit
Admission: RE | Admit: 2019-01-08 | Discharge: 2019-01-08 | Disposition: A | Discharge status: Home / Self Care | Payer: Medicare Other | Source: Ambulatory Visit | Attending: Nurse Practitioner | Admitting: Nurse Practitioner

## 2019-01-08 ENCOUNTER — Other Ambulatory Visit: Payer: Medicare Other

## 2019-01-08 DIAGNOSIS — J439 Emphysema, unspecified: Secondary | ICD-10-CM | POA: Diagnosis not present

## 2019-01-08 DIAGNOSIS — D4959 Neoplasm of unspecified behavior of other genitourinary organ: Secondary | ICD-10-CM

## 2019-01-08 DIAGNOSIS — Z8541 Personal history of malignant neoplasm of cervix uteri: Secondary | ICD-10-CM | POA: Insufficient documentation

## 2019-01-08 DIAGNOSIS — R911 Solitary pulmonary nodule: Secondary | ICD-10-CM | POA: Insufficient documentation

## 2019-01-08 DIAGNOSIS — C539 Malignant neoplasm of cervix uteri, unspecified: Secondary | ICD-10-CM | POA: Diagnosis not present

## 2019-01-08 LAB — GLUCOSE, CAPILLARY: Glucose-Capillary: 80 mg/dL (ref 70–99)

## 2019-01-08 MED ORDER — FLUDEOXYGLUCOSE F - 18 (FDG) INJECTION
7.2000 | Freq: Once | INTRAVENOUS | Status: AC | PRN
  Administered 2019-01-08: 7.87 via INTRAVENOUS

## 2019-01-08 NOTE — Progress Notes (Signed)
Case discussed at case conference. Recommended having patient return to clinic for repeat biopsy as no evidence of invasive malignancy on biopsies.

## 2019-01-09 ENCOUNTER — Telehealth: Payer: Self-pay

## 2019-01-09 NOTE — Progress Notes (Signed)
Tumor Board Documentation  Mary Hoover was presented by Mariea Clonts at our Tumor Board on 01/08/2019, which included representatives from medical oncology, radiation oncology, surgical oncology, pathology, navigation.  Mary Hoover currently presents as a current patient, for [facility], for new positive pathology with history of the following treatments: none.  Additionally, we reviewed previous medical and familial history, history of present illness, and recent lab results along with all available histopathologic and imaging studies. The tumor board considered available treatment options and made the following recommendations: Biopsy PET results pending. Obtain additional biopsies  The following procedures/referrals were also placed: No orders of the defined types were placed in this encounter.   Clinical Trial Status: not discussed   Staging used: To be determined  National site-specific guidelines   were discussed with respect to the case.  Tumor board is a meeting of clinicians from various specialty areas who evaluate and discuss patients for whom a multidisciplinary approach is being considered. Final determinations in the plan of care are those of the provider(s). The responsibility for follow up of recommendations given during tumor board is that of the provider.   Today's extended care, comprehensive team conference, Mary Hoover was not present for the discussion and was not examined.   Multidisciplinary Tumor Board is a multidisciplinary case peer review process.  Decisions discussed in the Multidisciplinary Tumor Board reflect the opinions of the specialists present at the conference without having examined the patient.  Ultimately, treatment and diagnostic decisions rest with the primary provider(s) and the patient.

## 2019-01-09 NOTE — Telephone Encounter (Signed)
Mary Hoover was discussed at gyn onc MDT 01/08/19. It was recommended that she come in for a repeat cervical biopsy. I reviewed her pathology from previous biopsy with her and reasons for repeat biopsy. She has been scheduled for 12/23 at 1100. We also went over PET scan results. She states a few years ago her physician told her she had lung cancer then told her it was an infection after scan. Reviewed her 2017 CT with her and this lesion is in the opposite lung. It it small, 9mm, and does have some SUV uptake. We also reviewed the pelvic sidewall lymph nodes on the scan as well as the cervix findings. We can review all of these findings further at her upcoming appointment.  Cervix biopsy 01/01/19 DIAGNOSIS:  A. CERVIX; BIOPSY:  - AT LEAST HIGH-GRADE SQUAMOUS INTRAEPITHELIAL LESION (HSIL/CIN3).  - SEE COMMENT.   Comment:  Invasion is not definitively identified in this sample. Numerous step  sections are examined. Given the exophytic growth pattern of the  lesion, an underlying invasive component cannot be excluded. The  diagnosis of squamous cell carcinoma on recent pap smear is noted.   Immunohistochemical stain for p16 is negative.  PET 01/08/19 IMPRESSION: 1. Marked hypermetabolism in the region of the cervix, compatible with the reported history of cervical cancer. 2. Small bilateral pelvic sidewall lymph nodes show discernible FDG accumulation, concerning for metastatic disease although neither lymph node is enlarged by CT size criteria. 3. 7 mm nodule in the right upper lobe shows discernible FDG accumulation. Given the discernible uptake in a tiny lung nodule of this size, neoplasm is a distinct consideration. Primary bronchogenic carcinoma or metastatic disease could have this appearance. 4.  Aortic Atherosclerois (ICD10-170.0) 5.  Emphysema. (ICD10-J43.9)

## 2019-01-16 ENCOUNTER — Ambulatory Visit (INDEPENDENT_AMBULATORY_CARE_PROVIDER_SITE_OTHER): Payer: Medicare Other | Admitting: Family Medicine

## 2019-01-16 ENCOUNTER — Encounter: Payer: Self-pay | Admitting: Family Medicine

## 2019-01-16 ENCOUNTER — Other Ambulatory Visit: Payer: Self-pay

## 2019-01-16 VITALS — BP 127/72 | Ht 67.0 in | Wt 139.0 lb

## 2019-01-16 DIAGNOSIS — E039 Hypothyroidism, unspecified: Secondary | ICD-10-CM | POA: Diagnosis not present

## 2019-01-16 DIAGNOSIS — I10 Essential (primary) hypertension: Secondary | ICD-10-CM

## 2019-01-16 MED ORDER — LISINOPRIL 20 MG PO TABS: 20.0000 mg | ORAL_TABLET | Freq: Every day | ORAL | 1 refills | Status: DC

## 2019-01-16 NOTE — Progress Notes (Signed)
BP 127/72 (BP Location: Left Arm, Patient Position: Sitting, Cuff Size: Normal)   Ht 5\' 7"  (1.702 m)   Wt 139 lb (63 kg)   BMI 21.77 kg/m    Subjective:    Patient ID: Mary Hoover, female    DOB: 08-Oct-1945, 73 y.o.   MRN: [id number]  HPI: Mary Hoover is a 73 y.o. female  Chief Complaint  Patient presents with  . Hypertension  . Hypothyroidism   HYPERTENSION Hypertension status: controlled  Satisfied with current treatment? yes Duration of hypertension: chronic BP monitoring frequency:  not checking BP medication side effects:  no Medication compliance: excellent compliance Previous BP meds: lisinopril Aspirin: no Recurrent headaches: no Visual changes: no Palpitations: no Dyspnea: no Chest pain: no Lower extremity edema: no Dizzy/lightheaded: no  HYPOTHYROIDISM Thyroid control status:better Satisfied with current treatment? yes Medication side effects: no Medication compliance: good compliance Recent dose adjustment:yes Fatigue: yes Cold intolerance: no Heat intolerance: no Weight gain: no Weight loss: no Constipation: no Diarrhea/loose stools: yes Palpitations: no Lower extremity edema: no Anxiety/depressed mood: no  Relevant past medical, surgical, family and social history reviewed and updated as indicated. Interim medical history since our last visit reviewed. Allergies and medications reviewed and updated.  Review of Systems  Constitutional: Negative.   Respiratory: Negative.   Cardiovascular: Negative.   Neurological: Negative.   Psychiatric/Behavioral: Negative.     Per HPI unless specifically indicated above     Objective:    BP 127/72 (BP Location: Left Arm, Patient Position: Sitting, Cuff Size: Normal)   Ht 5\' 7"  (1.702 m)   Wt 139 lb (63 kg)   BMI 21.77 kg/m   Wt Readings from Last 3 Encounters:  01/16/19 139 lb (63 kg)  01/01/19 139 lb 6.4 oz (63.2 kg)  12/19/18 141 lb (64 kg)    Physical Exam Vitals and nursing  note reviewed.  Pulmonary:     Effort: Pulmonary effort is normal. No respiratory distress.     Comments: Speaking in full sentences Neurological:     Mental Status: She is alert.  Psychiatric:        Mood and Affect: Mood normal.        Behavior: Behavior normal.        Thought Content: Thought content normal.        Judgment: Judgment normal.     Results for orders placed or performed during the hospital encounter of 01/08/19  Glucose, capillary  Result Value Ref Range   Glucose-Capillary 80 70 - 99 mg/dL      Assessment & Plan:   Problem List Items Addressed This Visit      Cardiovascular and Mediastinum   Essential hypertension    Under good control on current regimen. Continue current regimen. Continue to monitor. Call with any concerns. Refills given. Labs to be drawn at cancer center appointment next week.       Relevant Medications   lisinopril (ZESTRIL) 20 MG tablet   Other Relevant Orders   Basic Metabolic Panel (BMET)     Endocrine   Hypothyroidism - Primary    Will recheck levels at her oncology appointment next week and adjust dose accordingly. Call with any concerns. Continue to monitor.       Relevant Orders   TSH       Follow up plan: Return in about 6 months (around 07/17/2019) for Wellness/physical.    . This visit was completed via telephone due to the restrictions of the COVID-19 pandemic.  All issues as above were discussed and addressed but no physical exam was performed. If it was felt that the patient should be evaluated in the office, they were directed there. The patient verbally consented to this visit. Patient was unable to complete an audio/visual visit due to Lack of equipment. Due to the catastrophic nature of the COVID-19 pandemic, this visit was done through audio contact only. . Location of the patient: home . Location of the provider: work . Those involved with this call:  . Provider: Park Liter, DO . CMA: Merilyn Baba, CMA .  Front Desk/Registration: Don Perking  . Time spent on call: 25 minutes on the phone discussing health concerns. 40 minutes total spent in review of patient's record and preparation of their chart.

## 2019-01-16 NOTE — Assessment & Plan Note (Signed)
Will recheck levels at her oncology appointment next week and adjust dose accordingly. Call with any concerns. Continue to monitor.

## 2019-01-16 NOTE — Assessment & Plan Note (Signed)
Under good control on current regimen. Continue current regimen. Continue to monitor. Call with any concerns. Refills given. Labs to be drawn at cancer center appointment next week.

## 2019-01-21 ENCOUNTER — Other Ambulatory Visit: Payer: Self-pay

## 2019-01-21 NOTE — Progress Notes (Signed)
Gynecologic Oncology Interval Visit   Referring Provider: Dr. Livingston Diones Ob-Gyn  Chief Complaint: Probable Squamous Cell Cervical Cancer, repeat cervical biopsy  Subjective:  Mary Hoover is a 73 y.o. female who is seen in consultation from Dr. Wynetta Emery for new diagnosis of cervical cancer.   Since her last visit she had a PET scan and biopsy.   01/08/2019 Cervical biopsy. DIAGNOSIS:  A. CERVIX; BIOPSY:  - AT LEAST HIGH-GRADE SQUAMOUS INTRAEPITHELIAL LESION (HSIL/CIN3).   Comment: Invasion is not definitively identified in this sample. Numerous step sections are examined. Given the exophytic growth pattern of the lesion, an underlying invasive component cannot be excluded. The diagnosis of squamous cell carcinoma on recent pap smear is noted. Immunohistochemical stain for p16 is negative.    01/08/2019 PET IMPRESSION:1. Marked hypermetabolism in the region of the cervix, compatible with the reported history of cervical cancer. 2. Small bilateral pelvic sidewall lymph nodes show discernible FDG accumulation, concerning for metastatic disease although neither lymph node is enlarged by CT size criteria. 3. 7 mm nodule in the right upper lobe shows discernible FDG accumulation. Given the discernible uptake in a tiny lung nodule of this size, neoplasm is a distinct consideration. Primary bronchogenic carcinoma or metastatic disease could have this appearance. 4.  Aortic Atherosclerois (ICD10-170.0) 5.  Emphysema. (IRJ18-A41.9)  She presents today for repeat biopsy.   Gynecologic Oncology History  Mary Hoover is a pleasant y.o. female who is seen in consultation from Dr. Wynetta Emery for new diagnosis of probable cervical cancer.   Patient initially presented to her PCP with postmenopausal vaginal bleeding.  She was referred to Dr. Glennon Mac at [facility].  Last menstrual period around age 53.  On exam, cervix was friable and firm to palpation extending anteriorly.  Urethral caruncle  also noted.  Speculum exam caused some bleeding that required silver nitrate.  Pap and endometrial biopsy were performed.  Endometrial biopsy returned some exudative material.  Findings are concerning for neoplastic process.  Pap- 12/19/2018 - Squamous cell Carcinoma - High Risk HPV - negative  12/19/2018- Endometrial biopsy - Scattered atypical squamous cells suspicious for malignancy  - Predominantly necrosis with associated inflammation   CT Chest/Abdomen/Pelvis on 12/31/2018 for staging: Fluid distending the endometrial canal and/or endometrial thickening to the level of the cervix.  No adnexal mass or free fluid seen.  Haziness about the cervix of uncertain significance.  Chronic pleuroparenchymal scarring/fibrosis at lung apices, irregular pulmonary nodule within the Right upper lobe measuring 14m, moderate emphysema.   Colonoscopy: 2018 Mammogram: She is a current smoker 1-1.5 PPD with 75-pack-year history and has emphysema. No oxygen use. Not dyspneic unless climbs a lot of stairs.  Also s/p carotid end arterectomy for carotid stenosis  Problem List: Patient Active Problem List   Diagnosis Date Noted  . Carotid atherosclerosis 11/14/2016  . Myopia of both eyes 01/11/2016  . Lung nodules   . Essential hypertension 12/12/2014  . Hemoptysis 12/11/2014  . Medication monitoring encounter 11/25/2014  . Hyperlipidemia   . Status post carotid endarterectomy   . Hypothyroidism   . Tobacco use   . COPD (chronic obstructive pulmonary disease) ([place]   . Peripheral vascular disease ([facility] 09/15/2013    Past Medical History: Past Medical History:  Diagnosis Date  . Carotid atherosclerosis   . Carotid bruit   . COPD (chronic obstructive pulmonary disease) ([facility])    emphysema  . Hyperlipidemia   . Hypertension   . Hypothyroidism   . Infected cat bite 1980s   was hospitalized  .  Tobacco use     Past Surgical History: Past Surgical History:  Procedure Laterality Date  .  CAROTID ENDARTERECTOMY Right Oct. 2015   Dr. Lucky Cowboy  . CAROTID STENOSIS Left April 2016   carotid stenosis surgery  . COLONOSCOPY WITH PROPOFOL N/A 11/08/2016   Procedure: COLONOSCOPY WITH PROPOFOL;  Surgeon: Jonathon Bellows, MD;  Location: [facility] ENDOSCOPY;  Service: Gastroenterology;  Laterality: N/A;  . ESOPHAGOGASTRODUODENOSCOPY (EGD) WITH PROPOFOL N/A 11/08/2016   Procedure: ESOPHAGOGASTRODUODENOSCOPY (EGD) WITH PROPOFOL;  Surgeon: Jonathon Bellows, MD;  Location: [facility] ENDOSCOPY;  Service: Gastroenterology;  Laterality: N/A;  . FLEXIBLE BRONCHOSCOPY N/A 12/17/2014   Procedure: FLEXIBLE BRONCHOSCOPY;  Surgeon: Wilhelmina Mcardle, MD;  Location: [facility] ORS;  Service: Pulmonary;  Laterality: N/A;  . INCISION AND DRAINAGE / EXCISION THYROGLOSSAL CYST  April 2011    Past Ob/Gyn History: underwent menopause many years ago, no HRT.  Had 5 children. Not sexually active, husband had prostate cancer.      Family History: Family History  Problem Relation Age of Onset  . Congestive Heart Failure Mother   . Heart disease Mother   . Hypertension Mother   . Hypertension Sister   . Diabetes Sister   . Heart disease Sister   . Cancer Brother        liver, lung  . Heart disease Maternal Grandmother   . Heart disease Maternal Uncle   . Stroke Maternal Grandfather   . Heart disease Brother   . Hypertension Brother   . COPD Neg Hx     Social History: Social History   Socioeconomic History  . Marital status: Married    Spouse name: Not on file  . Number of children: Not on file  . Years of education: Not on file  . Highest education level: 9th grade  Occupational History  . Occupation: retired   Tobacco Use  . Smoking status: Current Every Day Smoker    Packs/day: 1.50    Years: 50.00    Pack years: 75.00    Types: Cigarettes  . Smokeless tobacco: Never Used  Substance and Sexual Activity  . Alcohol use: No    Alcohol/week: 0.0 standard drinks  . Drug use: No  . Sexual activity: Yes  Other  Topics Concern  . Not on file  Social History Narrative   Lives at home with husband   Social Determinants of Health   Financial Resource Strain:   . Difficulty of Paying Living Expenses: Not on file  Food Insecurity:   . Worried About Charity fundraiser in the Last Year: Not on file  . Ran Out of Food in the Last Year: Not on file  Transportation Needs:   . Lack of Transportation (Medical): Not on file  . Lack of Transportation (Non-Medical): Not on file  Physical Activity:   . Days of Exercise per Week: Not on file  . Minutes of Exercise per Session: Not on file  Stress:   . Feeling of Stress : Not on file  Social Connections:   . Frequency of Communication with Friends and Family: Not on file  . Frequency of Social Gatherings with Friends and Family: Not on file  . Attends Religious Services: Not on file  . Active Member of Clubs or Organizations: Not on file  . Attends Archivist Meetings: Not on file  . Marital Status: Not on file  Intimate Partner Violence:   . Fear of Current or Ex-Partner: Not on file  . Emotionally Abused: Not on file  .  Physically Abused: Not on file  . Sexually Abused: Not on file    Allergies: Allergies  Allergen Reactions  . Atorvastatin Other (See Comments)    dizziness    Current Medications: Current Outpatient Medications  Medication Sig Dispense Refill  . ASPIRIN 81 PO Take by mouth daily. 81 mg tablet    . budesonide-formoterol (SYMBICORT) 160-4.5 MCG/ACT inhaler Inhale 2 puffs into the lungs 2 (two) times daily. 1 Inhaler 12  . clopidogrel (PLAVIX) 75 MG tablet Take 1 tablet (75 mg total) by mouth daily. 90 tablet 3  . ferrous sulfate 325 (65 FE) MG EC tablet Take 1 tablet (325 mg total) by mouth daily with breakfast. 90 tablet 4  . levothyroxine (SYNTHROID) 150 MCG tablet Take 1 tablet (150 mcg total) by mouth daily. 30 tablet 2  . lisinopril (ZESTRIL) 20 MG tablet Take 1 tablet (20 mg total) by mouth daily. 90 tablet 1   . Multiple Vitamin (MULTIVITAMIN) tablet Take 1 tablet by mouth daily.    . vitamin B-12 (CYANOCOBALAMIN) 500 MCG tablet Take 500 mcg by mouth daily.     No current facility-administered medications for this visit.   Review of Systems General: fatigue, weakness  HEENT: no complaints  Lungs:SOB chronic due to underlying pulmonary condition  Cardiac: no complaints  GI: occasional diarrhea; no constipation, nausea or vomiting  GU: vaginal discharge; heaving bleeding after the prior biopsy notw improved and has vaginal spotting  Musculoskeletal: lower back pain for 2-3 months intermittent  Extremities: no complaints  Skin: no complaints  Neuro: no complaints  Endocrine: no complaints  Psych: no complaints       Objective:  Physical Examination:  BP (!) 155/62   Pulse 72   Temp 98.5 F (36.9 C) (Tympanic)   Resp 16   Wt 136 lb 14.4 oz (62.1 kg)   BMI 21.44 kg/m     ECOG Performance Status: 1 - Symptomatic but completely ambulatory GENERAL: Patient is a well appearing female in no acute distress   Pelvic:exam chaperoned by nurse;  Vulva: normal appearing vulva with no masses, tenderness or lesions; Vagina: normal vagina except disease involving there right vaginal fornix and the anterior vagina by the cervix. The cervix is friable with exophytic papillary tumor seen measuring 2 cm at least.; The remainder of the exam was deferred.   PROCEDURE: The risks and benefits of the procedure were reviewed and informed consent obtained. Time out was performed. The patient received pre-procedure teaching and expressed understanding. The post-procedure instructions were reviewed with the patient and she expressed understanding. The patient does not have any barriers to learning.  Biopsies of the cervix was performed after application Betadine. Two biopsies including 9 o'clock and another more lateral biopsy with cervix and vaginal wall . Procedure completed without complications. Hemostasis  adequate with Monsel's solution. The patient tolerated the procedure well.   Post-procedure evaluation the patient was stable without complaints.  Lab Review n/a  Radiologic Imaging: As per interval history and HPI    Assessment:  Mary Hoover is a 73 y.o. female diagnosed with probably squamous cell cancer of the cervix, at least IIIC1 with pelvic nodes on PET imaging or IVb if pulmonary nodule represents metastatic disease. Exam is consistent with definitive gross cervical cancer.   Right upper lobe pulmonary nodule, Positive FDG, concern for primary bronchogenic carcinoma or metastatic disease    Medical co-morbidities complicating care: heavy smoker/emphysema, vascular disease s/p carotid endarterectomy.  Plan:   Problem List Items Addressed This  Visit    None    Visit Diagnoses    Malignant neoplasm of cervix, unspecified site [facility])    -  Primary   Relevant Orders   Surgical pathology   Surgical pathology   Ambulatory referral to Oncology   Ambulatory referral to Radiation Oncology     Repeated cervical biopsies today and sent pathology stat.   Referral to Dr Baruch Gouty in [place] and Medical Oncology for chemoradiation.      Mariea Clonts re-reviewed PET scan results with Mary Hoover again and reviewed indication for chemoradiation rather than surgery.   Repeat Chest CT in 3 months to follow up pulmonary nodule.   The patient's diagnosis, an outline of the further diagnostic and laboratory studies which will be required, the recommendation for surgery, and alternatives were discussed with her and her accompanying family members.  All questions were answered to their satisfaction.  A total of 20 minutes were spent with the patient/family today; 50 % was spent in education, counseling and coordination of care for cervical cancer.    Riese Hellard Gaetana Michaelis, MD  CC:  Dr. Livingston Diones Ob-Gyn

## 2019-01-21 NOTE — Progress Notes (Signed)
Patient pre screened for office appointment, no questions or concerns today. Patient reminded of upcoming appointment time and date. 

## 2019-01-22 ENCOUNTER — Telehealth: Payer: Self-pay

## 2019-01-22 ENCOUNTER — Other Ambulatory Visit: Payer: Self-pay

## 2019-01-22 ENCOUNTER — Telehealth: Payer: Self-pay | Admitting: Family Medicine

## 2019-01-22 ENCOUNTER — Inpatient Hospital Stay (HOSPITAL_BASED_OUTPATIENT_CLINIC_OR_DEPARTMENT_OTHER): Payer: Medicare Other | Admitting: Obstetrics and Gynecology

## 2019-01-22 VITALS — BP 155/62 | HR 72 | Temp 98.5°F | Resp 16 | Wt 136.9 lb

## 2019-01-22 DIAGNOSIS — Z79899 Other long term (current) drug therapy: Secondary | ICD-10-CM | POA: Diagnosis not present

## 2019-01-22 DIAGNOSIS — E785 Hyperlipidemia, unspecified: Secondary | ICD-10-CM | POA: Diagnosis not present

## 2019-01-22 DIAGNOSIS — R0989 Other specified symptoms and signs involving the circulatory and respiratory systems: Secondary | ICD-10-CM | POA: Diagnosis not present

## 2019-01-22 DIAGNOSIS — N950 Postmenopausal bleeding: Secondary | ICD-10-CM | POA: Diagnosis not present

## 2019-01-22 DIAGNOSIS — E039 Hypothyroidism, unspecified: Secondary | ICD-10-CM | POA: Diagnosis not present

## 2019-01-22 DIAGNOSIS — C539 Malignant neoplasm of cervix uteri, unspecified: Secondary | ICD-10-CM

## 2019-01-22 DIAGNOSIS — I10 Essential (primary) hypertension: Secondary | ICD-10-CM

## 2019-01-22 DIAGNOSIS — C538 Malignant neoplasm of overlapping sites of cervix uteri: Secondary | ICD-10-CM | POA: Diagnosis not present

## 2019-01-22 DIAGNOSIS — I739 Peripheral vascular disease, unspecified: Secondary | ICD-10-CM | POA: Diagnosis not present

## 2019-01-22 DIAGNOSIS — Z7951 Long term (current) use of inhaled steroids: Secondary | ICD-10-CM | POA: Diagnosis not present

## 2019-01-22 DIAGNOSIS — J449 Chronic obstructive pulmonary disease, unspecified: Secondary | ICD-10-CM | POA: Diagnosis not present

## 2019-01-22 DIAGNOSIS — Z7982 Long term (current) use of aspirin: Secondary | ICD-10-CM | POA: Diagnosis not present

## 2019-01-22 DIAGNOSIS — J439 Emphysema, unspecified: Secondary | ICD-10-CM | POA: Diagnosis not present

## 2019-01-22 DIAGNOSIS — I6529 Occlusion and stenosis of unspecified carotid artery: Secondary | ICD-10-CM | POA: Diagnosis not present

## 2019-01-22 LAB — BASIC METABOLIC PANEL
Anion gap: 7 (ref 5–15)
BUN: 14 mg/dL (ref 8–23)
CO2: 25 mmol/L (ref 22–32)
Calcium: 9.3 mg/dL (ref 8.9–10.3)
Chloride: 99 mmol/L (ref 98–111)
Creatinine, Ser: 0.88 mg/dL (ref 0.44–1.00)
GFR calc Af Amer: >60 mL/min (ref >60)
GFR calc non Af Amer: >60 mL/min (ref >60)
Glucose, Bld: 99 mg/dL (ref 70–99)
Potassium: 4.5 mmol/L (ref 3.5–5.1)
Sodium: 131 mmol/L — ABNORMAL LOW (ref 135–145)

## 2019-01-22 LAB — TSH: TSH: 14.043 u[IU]/mL — ABNORMAL HIGH (ref 0.350–4.500)

## 2019-01-22 MED ORDER — LEVOTHYROXINE SODIUM 200 MCG PO TABS: 200.0000 ug | ORAL_TABLET | Freq: Every day | ORAL | 1 refills | Status: DC

## 2019-01-22 NOTE — Telephone Encounter (Signed)
Patient notified of results. Lab appt scheduled for 03/04/19.

## 2019-01-22 NOTE — Progress Notes (Signed)
Biopsies delivered to pathology. We will arrange a one month follow up at the completion of radiation. Referral sent to medical and radiation oncology. Provided a copy of PET results and reviewed with her. Educated and provided instructions on after care for her biopsy sites.

## 2019-01-22 NOTE — Telephone Encounter (Signed)
Notified Dr. Wynetta Emery that labs have resulted.

## 2019-01-22 NOTE — Telephone Encounter (Signed)
Please let her know that her electrolytes look good, but her thyroid is still quite under treated. Im going to have her increase to 273mcg daily and we'll recheck her labs in 6 weeks, but make sure she tries to take it every day. Thanks!

## 2019-01-22 NOTE — Patient Instructions (Signed)
Cervical Biopsy, Care After This sheet gives you information about how to care for yourself after your procedure. Your health care provider may also give you more specific instructions. If you have problems or questions, contact your health care provider. What can I expect after the procedure? After the procedure, it is common to have:  Cramping or mild pain for a few days.  Slight bleeding from the vagina for a few days.  Dark-colored vaginal discharge for a few days. Follow these instructions at home: Lifestyle  Do not douche until your health care provider approves.  Do not use tampons until your health care provider approves.  Do not have sex until your health care provider approves.  Return to your normal activities as told by your health care provider. Ask your health care provider what activities are safe for you. General instructions   Take over-the-counter and prescription medicines only as told by your health care provider.  Use a sanitary napkin until bleeding and discharge stop.  Keep all follow-up visits as told by your health care provider. This is important. Contact a health care provider if you have:  A fever or chills.  Bad-smelling vaginal discharge.  Itching or irritation around the vagina.  Lower abdominal pain. Get help right away if you:  Develop heavy vaginal bleeding that soaks more than one sanitary napkin an hour.  Faint.  Have severe pain in your lower abdomen. Summary  After the procedure, it is normal to have cramps, slight bleeding, and dark-colored discharge from the vagina.  After you return home, do not douche, have sex, or use a tampon until your health care provider approves.  Take over-the-counter and prescription medicines only as told by your health care provider.  Get help right away if you develop heavy bleeding, you faint, or you have severe pain in your lower abdomen. This information is not intended to replace advice  given to you by your health care provider. Make sure you discuss any questions you have with your health care provider. Document Released: 10/07/2014 Document Revised: 06/21/2017 Document Reviewed: 06/21/2017 Elsevier Patient Education  2020 [employer].

## 2019-01-22 NOTE — Progress Notes (Signed)
Pt had vaginal discharge and was given atb and it almost all went away. 3 weeks ago finished atb

## 2019-01-23 LAB — SURGICAL PATHOLOGY

## 2019-01-27 ENCOUNTER — Other Ambulatory Visit: Payer: Self-pay

## 2019-01-29 ENCOUNTER — Telehealth: Payer: Self-pay

## 2019-01-29 NOTE — Progress Notes (Signed)
Reviewed pathology results and in-basket message. Mary Hoover clinical findings and pathology results are consistent with squamous cell carcinoma. I agree with plan for Radiation Oncology and Medical Oncology consultations as scheduled on 02/03/2019. Mariea Clonts, RN will contact the patient with the results.    DIAGNOSIS:  A. VAGINAL WALL, RIGHT; BIOPSY:  - SQUAMOUS CELL CARCINOMA, SEE COMMENT.   B. CERVIX, 9 O'CLOCK; BIOPSY:  - SQUAMOUS CELL CARCINOMA, SEE COMMENT.   Comment:  There is a papillomatous squamous neoplasm with moderate to severe  dysplasia. No definitive stromal invasion is identified in these  samples. However, the clinically visible tumor and exophytic histology  in this case is diagnostic of squamous cell carcinoma with  exophytic-type invasion, according to the 2020 edition of the WHO  Classification of Tumours: Female genital tumours (see reference).   The slides from the previous cervical biopsy were reviewed  ([id number], 01/02/2019) and the current specimens have a similar  appearance. I note that the previous cervical specimen is p16 negative  by IHC, and that the recent cervical cytology specimen is negative for  HR-HPV ([id number], 12/19/2018).   [place] Gaetana Michaelis, MD

## 2019-01-29 NOTE — Telephone Encounter (Signed)
Called and went over pathology results with Mary Hoover. All questions answered. She has her appointment already arranged for med/rad oncology 02/03/19.  DIAGNOSIS:  A. VAGINAL WALL, RIGHT; BIOPSY:  - SQUAMOUS CELL CARCINOMA, SEE COMMENT.   B. CERVIX, 9 O'CLOCK; BIOPSY:  - SQUAMOUS CELL CARCINOMA, SEE COMMENT.   Comment:  There is a papillomatous squamous neoplasm with moderate to severe  dysplasia. No definitive stromal invasion is identified in these  samples. However, the clinically visible tumor and exophytic histology  in this case is diagnostic of squamous cell carcinoma with  exophytic-type invasion, according to the 2020 edition of the WHO  Classification of Tumours: Female genital tumours (see reference).   The slides from the previous cervical biopsy were reviewed  ([id number], 01/02/2019) and the current specimens have a similar  appearance. I note that the previous cervical specimen is p16 negative  by IHC, and that the recent cervical cytology specimen is negative for  HR-HPV ([id number], 12/19/2018).

## 2019-01-30 ENCOUNTER — Other Ambulatory Visit: Payer: Self-pay

## 2019-01-30 ENCOUNTER — Encounter: Payer: Self-pay | Admitting: Oncology

## 2019-01-30 NOTE — Progress Notes (Signed)
Patient referred by Dr. Theora Gianotti for malignant neoplasm of cervix. Pt was introduced to cancer center services and given the opportunity to ask questions prior to visit. Pt prescreened for appointment.  She will see Dr. Baruch Gouty prior to seeing Dr. Tasia Catchings. No concerns voiced.

## 2019-02-03 ENCOUNTER — Other Ambulatory Visit: Payer: Self-pay

## 2019-02-03 ENCOUNTER — Encounter: Payer: Self-pay | Admitting: Radiation Oncology

## 2019-02-03 ENCOUNTER — Ambulatory Visit
Admission: RE | Admit: 2019-02-03 | Discharge: 2019-02-03 | Disposition: A | Discharge status: Home / Self Care | Payer: Medicare Other | Source: Ambulatory Visit | Attending: Radiation Oncology | Admitting: Radiation Oncology

## 2019-02-03 ENCOUNTER — Other Ambulatory Visit: Payer: Self-pay | Admitting: *Deleted

## 2019-02-03 ENCOUNTER — Inpatient Hospital Stay: Payer: Medicare Other | Attending: Oncology | Admitting: Oncology

## 2019-02-03 VITALS — BP 157/66 | HR 77 | Temp 97.8°F | Resp 18 | Ht 67.0 in | Wt 139.1 lb

## 2019-02-03 VITALS — BP 157/64 | HR 81 | Temp 97.8°F | Resp 16 | Wt 139.0 lb

## 2019-02-03 DIAGNOSIS — R911 Solitary pulmonary nodule: Secondary | ICD-10-CM | POA: Insufficient documentation

## 2019-02-03 DIAGNOSIS — R918 Other nonspecific abnormal finding of lung field: Secondary | ICD-10-CM | POA: Diagnosis not present

## 2019-02-03 DIAGNOSIS — Z7982 Long term (current) use of aspirin: Secondary | ICD-10-CM | POA: Diagnosis not present

## 2019-02-03 DIAGNOSIS — J439 Emphysema, unspecified: Secondary | ICD-10-CM | POA: Insufficient documentation

## 2019-02-03 DIAGNOSIS — J449 Chronic obstructive pulmonary disease, unspecified: Secondary | ICD-10-CM | POA: Insufficient documentation

## 2019-02-03 DIAGNOSIS — I10 Essential (primary) hypertension: Secondary | ICD-10-CM | POA: Insufficient documentation

## 2019-02-03 DIAGNOSIS — Z79899 Other long term (current) drug therapy: Secondary | ICD-10-CM | POA: Diagnosis not present

## 2019-02-03 DIAGNOSIS — E039 Hypothyroidism, unspecified: Secondary | ICD-10-CM | POA: Diagnosis not present

## 2019-02-03 DIAGNOSIS — Z7951 Long term (current) use of inhaled steroids: Secondary | ICD-10-CM | POA: Insufficient documentation

## 2019-02-03 DIAGNOSIS — F17210 Nicotine dependence, cigarettes, uncomplicated: Secondary | ICD-10-CM | POA: Insufficient documentation

## 2019-02-03 DIAGNOSIS — R351 Nocturia: Secondary | ICD-10-CM | POA: Insufficient documentation

## 2019-02-03 DIAGNOSIS — I2510 Atherosclerotic heart disease of native coronary artery without angina pectoris: Secondary | ICD-10-CM | POA: Insufficient documentation

## 2019-02-03 DIAGNOSIS — C539 Malignant neoplasm of cervix uteri, unspecified: Secondary | ICD-10-CM | POA: Insufficient documentation

## 2019-02-03 DIAGNOSIS — Z7189 Other specified counseling: Secondary | ICD-10-CM

## 2019-02-03 DIAGNOSIS — E785 Hyperlipidemia, unspecified: Secondary | ICD-10-CM | POA: Insufficient documentation

## 2019-02-03 DIAGNOSIS — H9190 Unspecified hearing loss, unspecified ear: Secondary | ICD-10-CM | POA: Diagnosis not present

## 2019-02-03 DIAGNOSIS — Z5111 Encounter for antineoplastic chemotherapy: Secondary | ICD-10-CM | POA: Diagnosis not present

## 2019-02-03 DIAGNOSIS — Z720 Tobacco use: Secondary | ICD-10-CM | POA: Diagnosis not present

## 2019-02-03 MED ORDER — LIDOCAINE-PRILOCAINE 2.5-2.5 % EX CREA: TOPICAL_CREAM | CUTANEOUS | 3 refills | Status: DC

## 2019-02-03 MED ORDER — PROCHLORPERAZINE MALEATE 10 MG PO TABS: 10.0000 mg | ORAL_TABLET | Freq: Four times a day (QID) | ORAL | 1 refills | Status: DC | PRN

## 2019-02-03 NOTE — Progress Notes (Signed)
Reiterated plan of care as discussed by Dr. Cristela Blue. She forgot her hearing aids today and is having a slight difficulty with hearing. Further educated on brachytherapy and need to be seen at [facility]. Explained what to expect with brachytherapy and to expect a call to arrange this consult.

## 2019-02-03 NOTE — Progress Notes (Addendum)
Hematology/Oncology Consult note [facility] Telephone:([phone] [phone]   Patient Care Team: [name], DO as PCP - General (Family Medicine) Clent Jacks, RN as Oncology Nurse Navigator  REFERRING PROVIDER: Theora Gianotti, [place] Philbert Riser*  CHIEF COMPLAINTS/REASON FOR VISIT:  Evaluation of cervical cancer  HISTORY OF PRESENTING ILLNESS:   Mary Hoover is a  74 y.o.  female with PMH listed below was seen in consultation at the request of  Theora Gianotti, [place]*  for evaluation of cervical cancer Patient has developed postmenopausal vaginal bleeding and discharge.  She was noted to have a friable cervix/2 cm mass of the cervix, concerning for malignancy 12/19/2018 endometrial biopsy showed scattered atypical squamous cells suspicious for malignancy.  Predominantly necrosis with associated inflammation. 01/01/2019 initial cervix biopsy showed at least high-grade squamous intraepithelial lesion.-HPV negative 01/22/2019, repeat vaginal wall biopsy and cervix 9:00 biopsy showed squamous cell carcinoma.    Staging images 12/31/2018 showed fluid distending the endometrial canal and or endometrial thickening to the level of cervix.  No evidence of pathological lymphadenopathy.  Haziness about the lower cervix.  7 mm irregular pulmonary nodule within the right upper lobe, suspicious for possible primary or metastatic malignancy.  Chronic pleural-parenchymal scarring/fibrosis at the lung apices.  Large amount of stool.  No bowel obstruction. 01/08/2019 PET scan showed marked hypermetabolism in the region of the cervix, compatible with the reported history of cervical cancer. Small bilateral pelvic sidewall lymph nodes discernible FDG accumulation, concerning for metastatic disease although neither lymph node is enlarged by CT size criteria. 7 mm nodule in the right upper lobe shows discernible FDG accumulation.  Questionable neoplasm, primary versus  metastatic. Emphysema.   Today patient denies any pain.No vaginal bleeding. She has chronic hearing loss and she forgets to bring her hearing aids today.   Review of Systems  Constitutional: Negative for appetite change, chills, fatigue and fever.  HENT:   Positive for hearing loss. Negative for voice change.   Eyes: Negative for eye problems.  Respiratory: Negative for chest tightness and cough.   Cardiovascular: Negative for chest pain.  Gastrointestinal: Negative for abdominal distention, abdominal pain and blood in stool.  Endocrine: Negative for hot flashes.  Genitourinary: Negative for difficulty urinating and frequency.   Musculoskeletal: Negative for arthralgias.  Skin: Negative for itching and rash.  Neurological: Negative for extremity weakness.  Hematological: Negative for adenopathy.  Psychiatric/Behavioral: Negative for confusion.    MEDICAL HISTORY:  Past Medical History:  Diagnosis Date  . Carotid atherosclerosis   . Carotid bruit   . COPD (chronic obstructive pulmonary disease) (HCC)    emphysema  . Hyperlipidemia   . Hypertension   . Hypothyroidism   . Infected cat bite 1980s   was hospitalized  . Tobacco use     SURGICAL HISTORY: Past Surgical History:  Procedure Laterality Date  . CAROTID ENDARTERECTOMY Right Oct. 2015   Dr. Lucky Cowboy  . CAROTID STENOSIS Left April 2016   carotid stenosis surgery  . COLONOSCOPY WITH PROPOFOL N/A 11/08/2016   Procedure: COLONOSCOPY WITH PROPOFOL;  Surgeon: Jonathon Bellows, MD;  Location: [facility] ENDOSCOPY;  Service: Gastroenterology;  Laterality: N/A;  . ESOPHAGOGASTRODUODENOSCOPY (EGD) WITH PROPOFOL N/A 11/08/2016   Procedure: ESOPHAGOGASTRODUODENOSCOPY (EGD) WITH PROPOFOL;  Surgeon: Jonathon Bellows, MD;  Location: [facility] ENDOSCOPY;  Service: Gastroenterology;  Laterality: N/A;  . FLEXIBLE BRONCHOSCOPY N/A 12/17/2014   Procedure: FLEXIBLE BRONCHOSCOPY;  Surgeon: Wilhelmina Mcardle, MD;  Location: [facility] ORS;  Service: Pulmonary;   Laterality: N/A;  . INCISION AND  DRAINAGE / EXCISION THYROGLOSSAL CYST  April 2011    SOCIAL HISTORY: Social History   Socioeconomic History  . Marital status: Married    Spouse name: Not on file  . Number of children: Not on file  . Years of education: Not on file  . Highest education level: 9th grade  Occupational History  . Occupation: retired   Tobacco Use  . Smoking status: Current Every Day Smoker    Packs/day: 1.50    Years: 50.00    Pack years: 75.00    Types: Cigarettes  . Smokeless tobacco: Never Used  Substance and Sexual Activity  . Alcohol use: No    Alcohol/week: 0.0 standard drinks  . Drug use: No  . Sexual activity: Yes  Other Topics Concern  . Not on file  Social History Narrative   Lives at home with husband   Social Determinants of Health   Financial Resource Strain:   . Difficulty of Paying Living Expenses: Not on file  Food Insecurity:   . Worried About Charity fundraiser in the Last Year: Not on file  . Ran Out of Food in the Last Year: Not on file  Transportation Needs:   . Lack of Transportation (Medical): Not on file  . Lack of Transportation (Non-Medical): Not on file  Physical Activity:   . Days of Exercise per Week: Not on file  . Minutes of Exercise per Session: Not on file  Stress:   . Feeling of Stress : Not on file  Social Connections:   . Frequency of Communication with Friends and Family: Not on file  . Frequency of Social Gatherings with Friends and Family: Not on file  . Attends Religious Services: Not on file  . Active Member of Clubs or Organizations: Not on file  . Attends Archivist Meetings: Not on file  . Marital Status: Not on file  Intimate Partner Violence:   . Fear of Current or Ex-Partner: Not on file  . Emotionally Abused: Not on file  . Physically Abused: Not on file  . Sexually Abused: Not on file    FAMILY HISTORY: Family History  Problem Relation Age of Onset  . Congestive Heart Failure  Mother   . Heart disease Mother   . Hypertension Mother   . Hypertension Sister   . Diabetes Sister   . Heart disease Sister   . Cancer Brother        liver, lung  . Heart disease Maternal Grandmother   . Heart disease Maternal Uncle   . Stroke Maternal Grandfather   . Heart disease Brother   . Hypertension Brother   . Heart attack Brother   . COPD Neg Hx     ALLERGIES:  is allergic to atorvastatin.  MEDICATIONS:  Current Outpatient Medications  Medication Sig Dispense Refill  . ASPIRIN 81 PO Take by mouth daily. 81 mg tablet    . budesonide-formoterol (SYMBICORT) 160-4.5 MCG/ACT inhaler Inhale 2 puffs into the lungs 2 (two) times daily. 1 Inhaler 12  . clopidogrel (PLAVIX) 75 MG tablet Take 1 tablet (75 mg total) by mouth daily. 90 tablet 3  . diphenhydrAMINE (BENADRYL) 25 mg capsule Take 25 mg by mouth at bedtime as needed.    . ferrous sulfate 325 (65 FE) MG EC tablet Take 1 tablet (325 mg total) by mouth daily with breakfast. 90 tablet 4  . levothyroxine (SYNTHROID) 200 MCG tablet Take 1 tablet (200 mcg total) by mouth daily. 30 tablet 1  .  lisinopril (ZESTRIL) 20 MG tablet Take 1 tablet (20 mg total) by mouth daily. 90 tablet 1  . Multiple Vitamin (MULTIVITAMIN) tablet Take 1 tablet by mouth daily.    . vitamin B-12 (CYANOCOBALAMIN) 500 MCG tablet Take 500 mcg by mouth daily.     No current facility-administered medications for this visit.     PHYSICAL EXAMINATION: ECOG PERFORMANCE STATUS: 0 - Asymptomatic Vitals:   02/03/19 1054  BP: (!) 157/66  Pulse: 77  Resp: 18  Temp: 97.8 F (36.6 C)   Filed Weights   02/03/19 1054  Weight: 139 lb 1.6 oz (63.1 kg)    Physical Exam Constitutional:      General: She is not in acute distress. HENT:     Head: Normocephalic and atraumatic.  Eyes:     General: No scleral icterus.    Pupils: Pupils are equal, round, and reactive to light.  Cardiovascular:     Rate and Rhythm: Normal rate and regular rhythm.     Heart  sounds: Normal heart sounds.  Pulmonary:     Effort: Pulmonary effort is normal. No respiratory distress.     Breath sounds: No wheezing.  Abdominal:     General: Bowel sounds are normal. There is no distension.     Palpations: Abdomen is soft. There is no mass.     Tenderness: There is no abdominal tenderness.  Musculoskeletal:        General: No deformity. Normal range of motion.     Cervical back: Normal range of motion and neck supple.  Skin:    General: Skin is warm and dry.     Findings: No erythema or rash.  Neurological:     Mental Status: She is alert and oriented to person, place, and time.     Cranial Nerves: No cranial nerve deficit.     Coordination: Coordination normal.  Psychiatric:        Behavior: Behavior normal.        Thought Content: Thought content normal.     LABORATORY DATA:  I have reviewed the data as listed Lab Results  Component Value Date   WBC 9.3 01/01/2019   HGB 12.5 01/01/2019   HCT 37.0 01/01/2019   MCV 95.6 01/01/2019   PLT 365 01/01/2019   Recent Labs    07/11/18 1541 12/31/18 1024 01/01/19 1152 01/22/19 1115  NA 133*  --  125* 131*  K 4.6  --  4.3 4.5  CL 94*  --  96* 99  CO2 22  --  24 25  GLUCOSE 72  --  99 99  BUN 12  --  11 14  CREATININE 0.86 0.90 0.84 0.88  CALCIUM 9.7  --  8.8* 9.3  GFRNONAA 68  --  >60 >60  GFRAA 79  --  >60 >60  PROT 7.1  --  7.9  --   ALBUMIN 4.7  --  4.4  --   AST 13  --  16  --   ALT 9  --  14  --   ALKPHOS 80  --  70  --   BILITOT 0.3  --  0.5  --    Iron/TIBC/Ferritin/ %Sat    Component Value Date/Time   IRON 55 10/31/2016 1546   TIBC 339 10/31/2016 1546   FERRITIN 18 10/31/2016 1546   IRONPCTSAT 16 10/31/2016 1546      RADIOGRAPHIC STUDIES: I have personally reviewed the radiological images as listed and agreed with the findings in the  report.  NM PET Image Initial (PI) Skull Base To Thigh  Result Date: 01/08/2019 CLINICAL DATA:  Initial treatment strategy for cervical cancer.  EXAM: NUCLEAR MEDICINE PET SKULL BASE TO THIGH TECHNIQUE: 7.9 mCi F-18 FDG was injected intravenously. Full-ring PET imaging was performed from the skull base to thigh after the radiotracer. CT data was obtained and used for attenuation correction and anatomic localization. Fasting blood glucose: 80 mg/dl COMPARISON:  Chest abdomen pelvis CT 12/31/2018. FINDINGS: Mediastinal blood pool activity: SUV max 2.2 Liver activity: SUV max NA NECK: No hypermetabolic lymph nodes in the neck. Incidental CT findings: none CHEST: No hypermetabolic mediastinal or hilar nodes. The 7 mm nodule in the right upper lobe (image 83/3) shows discernible FDG accumulation with SUV max = 1.5. Incidental CT findings: Centrilobular emphsyema noted. Coronary artery calcification is evident. Atherosclerotic calcification is noted in the wall of the thoracic aorta. ABDOMEN/PELVIS: No abnormal hypermetabolic activity within the liver, pancreas, adrenal glands, or spleen. No hypermetabolic lymph nodes in the abdomen. 7 mm right pelvic sidewall lymph node visible on image 219/series 3 demonstrates SUV max = 3.0. Similar irregular 7 mm short axis left pelvic sidewall lymph node on 217/3 demonstrates SUV max = 3.4. There is marked hypermetabolism in the lower uterine segment/cervical region. Incidental CT findings: There is abdominal aortic atherosclerosis without aneurysm. Left colonic diverticulosis without diverticulitis. SKELETON: No focal hypermetabolic activity to suggest skeletal metastasis. Incidental CT findings: none IMPRESSION: 1. Marked hypermetabolism in the region of the cervix, compatible with the reported history of cervical cancer. 2. Small bilateral pelvic sidewall lymph nodes show discernible FDG accumulation, concerning for metastatic disease although neither lymph node is enlarged by CT size criteria. 3. 7 mm nodule in the right upper lobe shows discernible FDG accumulation. Given the discernible uptake in a tiny lung nodule of  this size, neoplasm is a distinct consideration. Primary bronchogenic carcinoma or metastatic disease could have this appearance. 4.  Aortic Atherosclerois (ICD10-170.0) 5.  Emphysema. (WVP71-G62.9) Electronically Signed   By: Misty Stanley M.D.   On: 01/08/2019 14:08      ASSESSMENT & PLAN:  1. Malignant neoplasm of cervix, unspecified site ([place])   2. Lung nodules   3. Goals of care, counseling/discussion   4. Tobacco abuse    #Pathology reports were reviewed and discussed with patient. Diagnosis of cervical cancer was discussed with patient. CT images and PET scan images were independently viewed by me and discussed with patient. GYN oncology note was reviewed by me. Patient has at least locally advanced cervical cancer, stage III C1 with pelvic nodes involvement or stage IV if lung nodules represents metastatic disease. I recommend concurrent chemotherapy and radiation. Patient has chronic severe hearing loss and cisplatin is not ideal option for her.  I recommend weekly carboplatin.  #Lung nodule, FDG avid, questionable primary bronchogenic carcinoma versus metastatic cancer. Given the irregular borders, I favor primary bronchogenic carcinoma.  Her case will be discussed on tumor board. Likely low yield on lung nodule biopsy given the small size and biopsy is with high risk given patient's emphysema,  I recommend to proceed with chemo radiation for advanced cervical cancer.  Monitor lung nodule, if growing in size, can consider SBRT  #I explained to the patient the risks and benefits of chemotherapy with weekly carboplatin including all but not limited to infusion reaction, hair loss, hearing loss, mouth sore, nausea, vomiting, low blood counts, bleeding, heart failure, kidney failure and risk of life threatening infection and even death, secondary malignancy  etc.   Patient voices understanding and willing to proceed chemotherapy.  Patient has establish care with radiation oncology.  #  Chemotherapy education;  #Goals of care, curative if lung nodule represents primary lung neoplasm.  Palliative if no nodule presents metastatic disease. Tobacco abuse smoke cessation was discussed with patient in details.  Orders Placed This Encounter  Procedures  . CBC with Differential    Standing Status:   Standing    Number of Occurrences:   20    Standing Expiration Date:   02/04/2020  . Basic metabolic panel    Standing Status:   Standing    Number of Occurrences:   20    Standing Expiration Date:   02/04/2020    All questions were answered. The patient knows to call the clinic with any problems questions or concerns.  cc [place], [place]*    Return of visit: To be determined. Thank you for this kind referral and the opportunity to participate in the care of this patient. A copy of today's note is routed to referring provider  [name], MD, PhD Hematology Oncology [facility] Pager- [phone] 02/03/2019

## 2019-02-03 NOTE — Progress Notes (Signed)
START OFF PATHWAY REGIMEN - Other   [id number]:Carboplatin AUC=2:   Administer weekly:     Carboplatin   **Always confirm dose/schedule in your pharmacy ordering system**  Patient Characteristics: Intent of Therapy: Curative Intent, Discussed with Patient 

## 2019-02-03 NOTE — Consult Note (Signed)
NEW PATIENT EVALUATION  Name: Mary Hoover  MRN: [id number]  Date:   02/03/2019     DOB: 09-06-1945   This 74 y.o. female patient presents to the clinic for initial evaluation of probable stage IIIc (T1b N1 MX) squamous cell carcinoma of the cervix.  REFERRING PHYSICIAN: [name], DO  CHIEF COMPLAINT:  Chief Complaint  Patient presents with  . Cervical Cancer    Initial consultation    DIAGNOSIS: The encounter diagnosis was Malignant neoplasm of cervix, unspecified site [facility]).   PREVIOUS INVESTIGATIONS:  PET scan and CT scans reviewed Pathology report reviewed Clinical notes reviewed Case presented at GYN tumor conference  HPI: Patient is a 74 year old female who presented with to her PCP with postmenopausal vaginal bleeding and discharge.  She is postmenopausal with her last menstrual period at age 89.  She was noted to have a friable cervix firm to palpation concerning for malignancy.  Pap smear on November 19 was positive for squamous cell carcinoma HPV was negative.  CT scan showed endometrial thickening at the level of the cervix.  She also had a 7 mm right upper lobe nodule.  Examination by Dr. Theora Gianotti showed at least a 2 cm mass of the cervix which was friable and exophytic.  Biopsy of the cervix upon review was positive for squamous cell carcinoma.  PET CT scan showed hypermetabolic activity in the region of the cervix as well as bilateral pelvic nodes concerning for pelvic lymph node metastasis.  Also 7 mm right upper lobe nodule was hypermetabolic concerning for either a second primary or metastatic disease.  Patient has multiple comorbidities including heavy smoker COPD emphysema vascular disease and is status post carotid endarterectomy.  Recommendation for concurrent chemoradiation was made.  Patient is seen today doing well she says her discharge is decreased and is minimal at this time.  She specifically denies any pelvic pain her bowels tend towards being loose.   She also has nocturia x3-4 and some frequency and urgency of urination.  PLANNED TREATMENT REGIMEN: Concurrent chemoradiation  PAST MEDICAL HISTORY:  has a past medical history of Carotid atherosclerosis, Carotid bruit, COPD (chronic obstructive pulmonary disease) ([place]), Hyperlipidemia, Hypertension, Hypothyroidism, Infected cat bite (1980s), and Tobacco use.    PAST SURGICAL HISTORY:  Past Surgical History:  Procedure Laterality Date  . CAROTID ENDARTERECTOMY Right Oct. 2015   Dr. Lucky Cowboy  . CAROTID STENOSIS Left April 2016   carotid stenosis surgery  . COLONOSCOPY WITH PROPOFOL N/A 11/08/2016   Procedure: COLONOSCOPY WITH PROPOFOL;  Surgeon: Jonathon Bellows, MD;  Location: [facility] ENDOSCOPY;  Service: Gastroenterology;  Laterality: N/A;  . ESOPHAGOGASTRODUODENOSCOPY (EGD) WITH PROPOFOL N/A 11/08/2016   Procedure: ESOPHAGOGASTRODUODENOSCOPY (EGD) WITH PROPOFOL;  Surgeon: Jonathon Bellows, MD;  Location: [facility] ENDOSCOPY;  Service: Gastroenterology;  Laterality: N/A;  . FLEXIBLE BRONCHOSCOPY N/A 12/17/2014   Procedure: FLEXIBLE BRONCHOSCOPY;  Surgeon: Wilhelmina Mcardle, MD;  Location: [facility] ORS;  Service: Pulmonary;  Laterality: N/A;  . INCISION AND DRAINAGE / EXCISION THYROGLOSSAL CYST  April 2011    FAMILY HISTORY: family history includes Cancer in her brother; Congestive Heart Failure in her mother; Diabetes in her sister; Heart attack in her brother; Heart disease in her brother, maternal grandmother, maternal uncle, mother, and sister; Hypertension in her brother, mother, and sister; Stroke in her maternal grandfather.  SOCIAL HISTORY:  reports that she has been smoking cigarettes. She has a 75.00 pack-year smoking history. She has never used smokeless tobacco. She reports that she does not drink alcohol or  use drugs.  ALLERGIES: Atorvastatin  MEDICATIONS:  Current Outpatient Medications  Medication Sig Dispense Refill  . ASPIRIN 81 PO Take by mouth daily. 81 mg tablet    . budesonide-formoterol  (SYMBICORT) 160-4.5 MCG/ACT inhaler Inhale 2 puffs into the lungs 2 (two) times daily. 1 Inhaler 12  . clopidogrel (PLAVIX) 75 MG tablet Take 1 tablet (75 mg total) by mouth daily. 90 tablet 3  . diphenhydrAMINE (BENADRYL) 25 mg capsule Take 25 mg by mouth at bedtime as needed.    . ferrous sulfate 325 (65 FE) MG EC tablet Take 1 tablet (325 mg total) by mouth daily with breakfast. 90 tablet 4  . levothyroxine (SYNTHROID) 200 MCG tablet Take 1 tablet (200 mcg total) by mouth daily. 30 tablet 1  . lisinopril (ZESTRIL) 20 MG tablet Take 1 tablet (20 mg total) by mouth daily. 90 tablet 1  . Multiple Vitamin (MULTIVITAMIN) tablet Take 1 tablet by mouth daily.    . vitamin B-12 (CYANOCOBALAMIN) 500 MCG tablet Take 500 mcg by mouth daily.     No current facility-administered medications for this encounter.    ECOG PERFORMANCE STATUS:  1 - Symptomatic but completely ambulatory  REVIEW OF SYSTEMS: Patient denies any weight loss, fatigue, weakness, fever, chills or night sweats. Patient denies any loss of vision, blurred vision. Patient denies any ringing  of the ears or hearing loss. No irregular heartbeat. Patient denies heart murmur or history of fainting. Patient denies any chest pain or pain radiating to her upper extremities. Patient denies any shortness of breath, difficulty breathing at night, cough or hemoptysis. Patient denies any swelling in the lower legs. Patient denies any nausea vomiting, vomiting of blood, or coffee ground material in the vomitus. Patient denies any stomach pain. Patient states has had normal bowel movements no significant constipation or diarrhea. Patient denies any dysuria, hematuria or significant nocturia. Patient denies any problems walking, swelling in the joints or loss of balance. Patient denies any skin changes, loss of hair or loss of weight. Patient denies any excessive worrying or anxiety or significant depression. Patient denies any problems with insomnia. Patient  denies excessive thirst, polyuria, polydipsia. Patient denies any swollen glands, patient denies easy bruising or easy bleeding. Patient denies any recent infections, allergies or URI. Patient "s visual fields have not changed significantly in recent time.   PHYSICAL EXAM: BP (!) 157/64 (BP Location: Left Arm, Patient Position: Sitting)   Pulse 81   Temp 97.8 F (36.6 C) (Tympanic)   Resp 16   Wt 139 lb (63 kg)   BMI 21.77 kg/m  Well-developed well-nourished patient in NAD. HEENT reveals PERLA, EOMI, discs not visualized.  Oral cavity is clear. No oral mucosal lesions are identified. Neck is clear without evidence of cervical or supraclavicular adenopathy. Lungs are clear to A&P. Cardiac examination is essentially unremarkable with regular rate and rhythm without murmur rub or thrill. Abdomen is benign with no organomegaly or masses noted. Motor sensory and DTR levels are equal and symmetric in the upper and lower extremities. Cranial nerves II through XII are grossly intact. Proprioception is intact. No peripheral adenopathy or edema is identified. No motor or sensory levels are noted. Crude visual fields are within normal range.  LABORATORY DATA: Pathology report reviewed    RADIOLOGY RESULTS: CT scans and PET CT scans reviewed compatible with above-stated findings   IMPRESSION: Lee stage IIIc cervical squamous cell carcinoma in 74 year old female with question of lung metastasis and solitary 7 mm lesion.  PLAN: At  this time I have recommended concurrent chemoradiation.  I would plan on delivering 4500 cGy to her pelvis.  I would use IMRT treatment planning and delivery to treat her pelvic lymph nodes that are hypermetabolic up to 0802 cGy with the remainder of nodes receiving 4500 cGy.  I would use PET fusion study for treatment planning.  Also will refer her to Dr. Christel Mormon at [facility] for intracavitary radiation valuation.  Patient is also seen medical oncology today for consideration of systemic  chemotherapy.  Risks and benefits of treatment including increased lower urinary tract symptoms diarrhea fatigue alteration of blood count skin reaction vaginal stenosis all were discussed in detail with the patient.  She seems to comprehend her treatment plan well.  I have personally set up and ordered CT simulation for later this week.  We will coordinate her chemotherapy with medical oncology.  I would like to take this opportunity to thank you for allowing me to participate in the care of your patient.Noreene Filbert, MD

## 2019-02-04 NOTE — Patient Instructions (Signed)
Carboplatin injection What is this medicine? CARBOPLATIN (KAR boe pla tin) is a chemotherapy drug. It targets fast dividing cells, like cancer cells, and causes these cells to die. This medicine is used to treat ovarian cancer and many other cancers. This medicine may be used for other purposes; ask your health care provider or pharmacist if you have questions. COMMON BRAND NAME(S): Paraplatin What should I tell my health care provider before I take this medicine? They need to know if you have any of these conditions:  blood disorders  hearing problems  kidney disease  recent or ongoing radiation therapy  an unusual or allergic reaction to carboplatin, cisplatin, other chemotherapy, other medicines, foods, dyes, or preservatives  pregnant or trying to get pregnant  breast-feeding How should I use this medicine? This drug is usually given as an infusion into a vein. It is administered in a hospital or clinic by a specially trained health care professional. Talk to your pediatrician regarding the use of this medicine in children. Special care may be needed. Overdosage: If you think you have taken too much of this medicine contact a poison control center or emergency room at once. NOTE: This medicine is only for you. Do not share this medicine with others. What if I miss a dose? It is important not to miss a dose. Call your doctor or health care professional if you are unable to keep an appointment. What may interact with this medicine?  medicines for seizures  medicines to increase blood counts like filgrastim, pegfilgrastim, sargramostim  some antibiotics like amikacin, gentamicin, neomycin, streptomycin, tobramycin  vaccines Talk to your doctor or health care professional before taking any of these medicines:  acetaminophen  aspirin  ibuprofen  ketoprofen  naproxen This list may not describe all possible interactions. Give your health care provider a list of all the  medicines, herbs, non-prescription drugs, or dietary supplements you use. Also tell them if you smoke, drink alcohol, or use illegal drugs. Some items may interact with your medicine. What should I watch for while using this medicine? Your condition will be monitored carefully while you are receiving this medicine. You will need important blood work done while you are taking this medicine. This drug may make you feel generally unwell. This is not uncommon, as chemotherapy can affect healthy cells as well as cancer cells. Report any side effects. Continue your course of treatment even though you feel ill unless your doctor tells you to stop. In some cases, you may be given additional medicines to help with side effects. Follow all directions for their use. Call your doctor or health care professional for advice if you get a fever, chills or sore throat, or other symptoms of a cold or flu. Do not treat yourself. This drug decreases your body's ability to fight infections. Try to avoid being around people who are sick. This medicine may increase your risk to bruise or bleed. Call your doctor or health care professional if you notice any unusual bleeding. Be careful brushing and flossing your teeth or using a toothpick because you may get an infection or bleed more easily. If you have any dental work done, tell your dentist you are receiving this medicine. Avoid taking products that contain aspirin, acetaminophen, ibuprofen, naproxen, or ketoprofen unless instructed by your doctor. These medicines may hide a fever. Do not become pregnant while taking this medicine. Women should inform their doctor if they wish to become pregnant or think they might be pregnant. There is a  potential for serious side effects to an unborn child. Talk to your health care professional or pharmacist for more information. Do not breast-feed an infant while taking this medicine. What side effects may I notice from receiving this  medicine? Side effects that you should report to your doctor or health care professional as soon as possible:  allergic reactions like skin rash, itching or hives, swelling of the face, lips, or tongue  signs of infection - fever or chills, cough, sore throat, pain or difficulty passing urine  signs of decreased platelets or bleeding - bruising, pinpoint red spots on the skin, black, tarry stools, nosebleeds  signs of decreased red blood cells - unusually weak or tired, fainting spells, lightheadedness  breathing problems  changes in hearing  changes in vision  chest pain  high blood pressure  low blood counts - This drug may decrease the number of white blood cells, red blood cells and platelets. You may be at increased risk for infections and bleeding.  nausea and vomiting  pain, swelling, redness or irritation at the injection site  pain, tingling, numbness in the hands or feet  problems with balance, talking, walking  trouble passing urine or change in the amount of urine Side effects that usually do not require medical attention (report to your doctor or health care professional if they continue or are bothersome):  hair loss  loss of appetite  metallic taste in the mouth or changes in taste This list may not describe all possible side effects. Call your doctor for medical advice about side effects. You may report side effects to FDA at 1-800-FDA-1088. Where should I keep my medicine? This drug is given in a hospital or clinic and will not be stored at home. NOTE: This sheet is a summary. It may not cover all possible information. If you have questions about this medicine, talk to your doctor, pharmacist, or health care provider.  2020 [employer] Standard (2007-04-23 14:38:05)

## 2019-02-05 ENCOUNTER — Other Ambulatory Visit: Payer: Self-pay

## 2019-02-05 ENCOUNTER — Inpatient Hospital Stay: Payer: Medicare Other

## 2019-02-05 ENCOUNTER — Inpatient Hospital Stay: Payer: Medicare Other | Admitting: Oncology

## 2019-02-06 ENCOUNTER — Ambulatory Visit
Admission: RE | Admit: 2019-02-06 | Discharge: 2019-02-06 | Disposition: A | Discharge status: Home / Self Care | Payer: Medicare Other | Source: Ambulatory Visit | Attending: Radiation Oncology | Admitting: Radiation Oncology

## 2019-02-06 ENCOUNTER — Other Ambulatory Visit: Payer: Self-pay

## 2019-02-06 DIAGNOSIS — Z510 Encounter for antineoplastic radiation therapy: Secondary | ICD-10-CM | POA: Diagnosis not present

## 2019-02-06 DIAGNOSIS — C539 Malignant neoplasm of cervix uteri, unspecified: Secondary | ICD-10-CM | POA: Insufficient documentation

## 2019-02-11 DIAGNOSIS — Z510 Encounter for antineoplastic radiation therapy: Secondary | ICD-10-CM | POA: Diagnosis not present

## 2019-02-11 DIAGNOSIS — C539 Malignant neoplasm of cervix uteri, unspecified: Secondary | ICD-10-CM | POA: Diagnosis not present

## 2019-02-12 ENCOUNTER — Other Ambulatory Visit: Payer: Self-pay

## 2019-02-12 ENCOUNTER — Other Ambulatory Visit: Payer: Medicare Other

## 2019-02-12 DIAGNOSIS — C539 Malignant neoplasm of cervix uteri, unspecified: Secondary | ICD-10-CM

## 2019-02-13 ENCOUNTER — Ambulatory Visit: Payer: Medicare Other | Attending: Internal Medicine

## 2019-02-13 DIAGNOSIS — Z20822 Contact with and (suspected) exposure to covid-19: Secondary | ICD-10-CM

## 2019-02-13 NOTE — Progress Notes (Signed)
Tumor Board Documentation  Mary Hoover was presented by Mariea Clonts at our Tumor Board on 02/12/2019, which included representatives from medical oncology, radiation oncology, surgical oncology, pathology, navigation.  Mary Hoover currently presents as a current patient, for discussion with history of the following treatments: none.  Additionally, we reviewed previous medical and familial history, history of present illness, and recent lab results along with all available histopathologic and imaging studies. The tumor board considered available treatment options and made the following recommendations: Radiation therapy (primary modality), Chemotherapy As planned carbo/radiation. Follow lung nodule and consider SBRT in the future  The following procedures/referrals were also placed: No orders of the defined types were placed in this encounter.   Clinical Trial Status: not discussed   Staging used: Clinical Stage(Stage IIIC1 or IVb if pulmonary nodule represents metastatic disease)  National site-specific guidelines   were discussed with respect to the case.  Tumor board is a meeting of clinicians from various specialty areas who evaluate and discuss patients for whom a multidisciplinary approach is being considered. Final determinations in the plan of care are those of the provider(s). The responsibility for follow up of recommendations given during tumor board is that of the provider.   Today's extended care, comprehensive team conference, Mary Hoover was not present for the discussion and was not examined.   Multidisciplinary Tumor Board is a multidisciplinary case peer review process.  Decisions discussed in the Multidisciplinary Tumor Board reflect the opinions of the specialists present at the conference without having examined the patient.  Ultimately, treatment and diagnostic decisions rest with the primary provider(s) and the patient.

## 2019-02-14 LAB — NOVEL CORONAVIRUS, NAA: SARS-CoV-2, NAA: NOT DETECTED

## 2019-02-15 ENCOUNTER — Telehealth: Payer: Self-pay | Admitting: Family Medicine

## 2019-02-15 NOTE — Telephone Encounter (Signed)
Patient called in and received her negative covid test result

## 2019-02-17 ENCOUNTER — Other Ambulatory Visit: Payer: Self-pay

## 2019-02-17 ENCOUNTER — Ambulatory Visit
Admission: RE | Admit: 2019-02-17 | Discharge: 2019-02-17 | Disposition: A | Discharge status: Home / Self Care | Payer: Medicare Other | Source: Ambulatory Visit | Attending: Radiation Oncology | Admitting: Radiation Oncology

## 2019-02-18 ENCOUNTER — Ambulatory Visit
Admission: RE | Admit: 2019-02-18 | Discharge: 2019-02-18 | Disposition: A | Discharge status: Home / Self Care | Payer: Medicare Other | Source: Ambulatory Visit | Attending: Radiation Oncology | Admitting: Radiation Oncology

## 2019-02-18 ENCOUNTER — Other Ambulatory Visit: Payer: Self-pay

## 2019-02-18 DIAGNOSIS — C539 Malignant neoplasm of cervix uteri, unspecified: Secondary | ICD-10-CM | POA: Diagnosis not present

## 2019-02-18 DIAGNOSIS — Z510 Encounter for antineoplastic radiation therapy: Secondary | ICD-10-CM | POA: Diagnosis not present

## 2019-02-19 ENCOUNTER — Other Ambulatory Visit: Payer: Self-pay

## 2019-02-19 ENCOUNTER — Ambulatory Visit: Payer: Medicare Other | Admitting: Oncology

## 2019-02-19 ENCOUNTER — Ambulatory Visit: Payer: Medicare Other

## 2019-02-19 ENCOUNTER — Other Ambulatory Visit: Payer: Medicare Other

## 2019-02-19 ENCOUNTER — Ambulatory Visit
Admission: RE | Admit: 2019-02-19 | Discharge: 2019-02-19 | Disposition: A | Discharge status: Home / Self Care | Payer: Medicare Other | Source: Ambulatory Visit | Attending: Radiation Oncology | Admitting: Radiation Oncology

## 2019-02-19 DIAGNOSIS — C539 Malignant neoplasm of cervix uteri, unspecified: Secondary | ICD-10-CM | POA: Diagnosis not present

## 2019-02-19 DIAGNOSIS — Z510 Encounter for antineoplastic radiation therapy: Secondary | ICD-10-CM | POA: Diagnosis not present

## 2019-02-20 ENCOUNTER — Ambulatory Visit
Admission: RE | Admit: 2019-02-20 | Discharge: 2019-02-20 | Disposition: A | Discharge status: Home / Self Care | Payer: Medicare Other | Source: Ambulatory Visit | Attending: Radiation Oncology | Admitting: Radiation Oncology

## 2019-02-20 ENCOUNTER — Inpatient Hospital Stay: Payer: Medicare Other

## 2019-02-20 ENCOUNTER — Other Ambulatory Visit: Payer: Self-pay

## 2019-02-20 ENCOUNTER — Inpatient Hospital Stay (HOSPITAL_BASED_OUTPATIENT_CLINIC_OR_DEPARTMENT_OTHER): Payer: Medicare Other | Admitting: Oncology

## 2019-02-20 ENCOUNTER — Encounter: Payer: Self-pay | Admitting: Oncology

## 2019-02-20 VITALS — BP 148/60 | HR 65 | Temp 97.0°F | Resp 18

## 2019-02-20 VITALS — BP 148/78 | HR 66 | Temp 98.0°F | Resp 18 | Wt 135.3 lb

## 2019-02-20 DIAGNOSIS — C539 Malignant neoplasm of cervix uteri, unspecified: Secondary | ICD-10-CM

## 2019-02-20 DIAGNOSIS — R918 Other nonspecific abnormal finding of lung field: Secondary | ICD-10-CM | POA: Diagnosis not present

## 2019-02-20 DIAGNOSIS — I10 Essential (primary) hypertension: Secondary | ICD-10-CM | POA: Diagnosis not present

## 2019-02-20 DIAGNOSIS — Z510 Encounter for antineoplastic radiation therapy: Secondary | ICD-10-CM | POA: Diagnosis not present

## 2019-02-20 DIAGNOSIS — Z7189 Other specified counseling: Secondary | ICD-10-CM | POA: Diagnosis not present

## 2019-02-20 DIAGNOSIS — E039 Hypothyroidism, unspecified: Secondary | ICD-10-CM | POA: Diagnosis not present

## 2019-02-20 DIAGNOSIS — Z7951 Long term (current) use of inhaled steroids: Secondary | ICD-10-CM | POA: Diagnosis not present

## 2019-02-20 DIAGNOSIS — H9190 Unspecified hearing loss, unspecified ear: Secondary | ICD-10-CM | POA: Diagnosis not present

## 2019-02-20 DIAGNOSIS — Z5111 Encounter for antineoplastic chemotherapy: Secondary | ICD-10-CM

## 2019-02-20 DIAGNOSIS — E785 Hyperlipidemia, unspecified: Secondary | ICD-10-CM | POA: Diagnosis not present

## 2019-02-20 DIAGNOSIS — Z7982 Long term (current) use of aspirin: Secondary | ICD-10-CM | POA: Diagnosis not present

## 2019-02-20 DIAGNOSIS — Z79899 Other long term (current) drug therapy: Secondary | ICD-10-CM | POA: Diagnosis not present

## 2019-02-20 DIAGNOSIS — J449 Chronic obstructive pulmonary disease, unspecified: Secondary | ICD-10-CM | POA: Diagnosis not present

## 2019-02-20 LAB — CBC WITH DIFFERENTIAL/PLATELET
Abs Immature Granulocytes: 0.02 10*3/uL (ref 0.00–0.07)
Basophils Absolute: 0 10*3/uL (ref 0.0–0.1)
Basophils Relative: 0 %
Eosinophils Absolute: 0.2 10*3/uL (ref 0.0–0.5)
Eosinophils Relative: 2 %
HCT: 35.8 % — ABNORMAL LOW (ref 36.0–46.0)
Hemoglobin: 12 g/dL (ref 12.0–15.0)
Immature Granulocytes: 0 %
Lymphocytes Relative: 12 %
Lymphs Abs: 0.9 10*3/uL (ref 0.7–4.0)
MCH: 32.4 pg (ref 26.0–34.0)
MCHC: 33.5 g/dL (ref 30.0–36.0)
MCV: 96.8 fL (ref 80.0–100.0)
Monocytes Absolute: 0.5 10*3/uL (ref 0.1–1.0)
Monocytes Relative: 7 %
Neutro Abs: 6.1 10*3/uL (ref 1.7–7.7)
Neutrophils Relative %: 79 %
Platelets: 310 10*3/uL (ref 150–400)
RBC: 3.7 MIL/uL — ABNORMAL LOW (ref 3.87–5.11)
RDW: 11.9 % (ref 11.5–15.5)
WBC: 7.7 10*3/uL (ref 4.0–10.5)
nRBC: 0 % (ref 0.0–0.2)

## 2019-02-20 LAB — HEPATIC FUNCTION PANEL
ALT: 11 U/L (ref 0–44)
AST: 16 U/L (ref 15–41)
Albumin: 4 g/dL (ref 3.5–5.0)
Alkaline Phosphatase: 66 U/L (ref 38–126)
Bilirubin, Direct: <0.1 mg/dL (ref 0.0–0.2)
Total Bilirubin: 0.5 mg/dL (ref 0.3–1.2)
Total Protein: 7.2 g/dL (ref 6.5–8.1)

## 2019-02-20 LAB — BASIC METABOLIC PANEL
Anion gap: 10 (ref 5–15)
BUN: 13 mg/dL (ref 8–23)
CO2: 23 mmol/L (ref 22–32)
Calcium: 9.2 mg/dL (ref 8.9–10.3)
Chloride: 97 mmol/L — ABNORMAL LOW (ref 98–111)
Creatinine, Ser: 0.81 mg/dL (ref 0.44–1.00)
GFR calc Af Amer: >60 mL/min (ref >60)
GFR calc non Af Amer: >60 mL/min (ref >60)
Glucose, Bld: 103 mg/dL — ABNORMAL HIGH (ref 70–99)
Potassium: 4.5 mmol/L (ref 3.5–5.1)
Sodium: 130 mmol/L — ABNORMAL LOW (ref 135–145)

## 2019-02-20 MED ORDER — SODIUM CHLORIDE 0.9 % IV SOLN
152.8000 mg | Freq: Once | INTRAVENOUS | Status: AC
  Administered 2019-02-20: 150 mg via INTRAVENOUS
  Filled 2019-02-20: qty 15

## 2019-02-20 MED ORDER — SODIUM CHLORIDE 0.9 % IV SOLN
Freq: Once | INTRAVENOUS | Status: AC
  Filled 2019-02-20: qty 250

## 2019-02-20 MED ORDER — PALONOSETRON HCL INJECTION 0.25 MG/5ML
0.2500 mg | Freq: Once | INTRAVENOUS | Status: AC
  Administered 2019-02-20: 0.25 mg via INTRAVENOUS
  Filled 2019-02-20: qty 5

## 2019-02-20 MED ORDER — SODIUM CHLORIDE 0.9 % IV SOLN
10.0000 mg | Freq: Once | INTRAVENOUS | Status: AC
  Administered 2019-02-20: 10 mg via INTRAVENOUS
  Filled 2019-02-20: qty 10

## 2019-02-20 NOTE — Progress Notes (Signed)
Pt tolerated infusion well. Pt denies any concerns at this time. No s/s of distress noted. Pt and VS stable at discharge.

## 2019-02-20 NOTE — Progress Notes (Signed)
Hematology/Oncology note [facility] [phone] [phone]   Patient Care Team: [name], DO as PCP - General (Family Medicine) Clent Jacks, RN as Oncology Nurse Navigator  REFERRING PROVIDER: [name], DO  CHIEF COMPLAINTS/REASON FOR VISIT:  Follow up cervical cancer  HISTORY OF PRESENTING ILLNESS:   Mary Hoover is a  74 y.o.  female with PMH listed below was seen in consultation at the request of  [name], DO  for evaluation of cervical cancer Patient has developed postmenopausal vaginal bleeding and discharge.  She was noted to have a friable cervix/2 cm mass of the cervix, concerning for malignancy 12/19/2018 endometrial biopsy showed scattered atypical squamous cells suspicious for malignancy.  Predominantly necrosis with associated inflammation. 01/01/2019 initial cervix biopsy showed at least high-grade squamous intraepithelial lesion.-HPV negative 01/22/2019, repeat vaginal wall biopsy and cervix 9:00 biopsy showed squamous cell carcinoma.    Staging images 12/31/2018 showed fluid distending the endometrial canal and or endometrial thickening to the level of cervix.  No evidence of pathological lymphadenopathy.  Haziness about the lower cervix.  7 mm irregular pulmonary nodule within the right upper lobe, suspicious for possible primary or metastatic malignancy.  Chronic pleural-parenchymal scarring/fibrosis at the lung apices.  Large amount of stool.  No bowel obstruction. 01/08/2019 PET scan showed marked hypermetabolism in the region of the cervix, compatible with the reported history of cervical cancer. Small bilateral pelvic sidewall lymph nodes discernible FDG accumulation, concerning for metastatic disease although neither lymph node is enlarged by CT size criteria. 7 mm nodule in the right upper lobe shows discernible FDG accumulation.  Questionable neoplasm, primary versus metastatic. Emphysema.     #Chronic hearing loss INTERVAL HISTORY Mary Hoover is a 74 y.o. female who has above history reviewed by me today presents for follow up visit for management of low  advanced cervix cancer Problems and complaints are listed below: Today patient denies any new complaints. She is starting concurrent radiation and chemotherapy today.     Review of Systems  Constitutional: Negative for appetite change, chills, fatigue and fever.  HENT:   Positive for hearing loss. Negative for voice change.   Eyes: Negative for eye problems.  Respiratory: Negative for chest tightness and cough.   Cardiovascular: Negative for chest pain.  Gastrointestinal: Negative for abdominal distention, abdominal pain and blood in stool.  Endocrine: Negative for hot flashes.  Genitourinary: Negative for difficulty urinating and frequency.   Musculoskeletal: Negative for arthralgias.  Skin: Negative for itching and rash.  Neurological: Negative for extremity weakness.  Hematological: Negative for adenopathy.  Psychiatric/Behavioral: Negative for confusion.    MEDICAL HISTORY:  Past Medical History:  Diagnosis Date  . Carotid atherosclerosis   . Carotid bruit   . COPD (chronic obstructive pulmonary disease) (HCC)    emphysema  . Hyperlipidemia   . Hypertension   . Hypothyroidism   . Infected cat bite 1980s   was hospitalized  . Tobacco use     SURGICAL HISTORY: Past Surgical History:  Procedure Laterality Date  . CAROTID ENDARTERECTOMY Right Oct. 2015   Dr. Lucky Cowboy  . CAROTID STENOSIS Left April 2016   carotid stenosis surgery  . COLONOSCOPY WITH PROPOFOL N/A 11/08/2016   Procedure: COLONOSCOPY WITH PROPOFOL;  Surgeon: Jonathon Bellows, MD;  Location: [facility] ENDOSCOPY;  Service: Gastroenterology;  Laterality: N/A;  . ESOPHAGOGASTRODUODENOSCOPY (EGD) WITH PROPOFOL N/A 11/08/2016   Procedure: ESOPHAGOGASTRODUODENOSCOPY (EGD) WITH PROPOFOL;  Surgeon: Jonathon Bellows, MD;  Location: [facility] ENDOSCOPY;  Service:  Gastroenterology;  Laterality: N/A;  . FLEXIBLE BRONCHOSCOPY N/A 12/17/2014   Procedure: FLEXIBLE BRONCHOSCOPY;  Surgeon: Wilhelmina Mcardle, MD;  Location: [facility] ORS;  Service: Pulmonary;  Laterality: N/A;  . INCISION AND DRAINAGE / EXCISION THYROGLOSSAL CYST  April 2011    SOCIAL HISTORY: Social History   Socioeconomic History  . Marital status: Married    Spouse name: Not on file  . Number of children: Not on file  . Years of education: Not on file  . Highest education level: 9th grade  Occupational History  . Occupation: retired   Tobacco Use  . Smoking status: Current Every Day Smoker    Packs/day: 1.50    Years: 50.00    Pack years: 75.00    Types: Cigarettes  . Smokeless tobacco: Never Used  Substance and Sexual Activity  . Alcohol use: No    Alcohol/week: 0.0 standard drinks  . Drug use: No  . Sexual activity: Yes  Other Topics Concern  . Not on file  Social History Narrative   Lives at home with husband   Social Determinants of Health   Financial Resource Strain:   . Difficulty of Paying Living Expenses: Not on file  Food Insecurity:   . Worried About Charity fundraiser in the Last Year: Not on file  . Ran Out of Food in the Last Year: Not on file  Transportation Needs:   . Lack of Transportation (Medical): Not on file  . Lack of Transportation (Non-Medical): Not on file  Physical Activity:   . Days of Exercise per Week: Not on file  . Minutes of Exercise per Session: Not on file  Stress:   . Feeling of Stress : Not on file  Social Connections:   . Frequency of Communication with Friends and Family: Not on file  . Frequency of Social Gatherings with Friends and Family: Not on file  . Attends Religious Services: Not on file  . Active Member of Clubs or Organizations: Not on file  . Attends Archivist Meetings: Not on file  . Marital Status: Not on file  Intimate Partner Violence:   . Fear of Current or Ex-Partner: Not on file  . Emotionally  Abused: Not on file  . Physically Abused: Not on file  . Sexually Abused: Not on file    FAMILY HISTORY: Family History  Problem Relation Age of Onset  . Congestive Heart Failure Mother   . Heart disease Mother   . Hypertension Mother   . Hypertension Sister   . Diabetes Sister   . Heart disease Sister   . Cancer Brother        liver, lung  . Heart disease Maternal Grandmother   . Heart disease Maternal Uncle   . Stroke Maternal Grandfather   . Heart disease Brother   . Hypertension Brother   . Heart attack Brother   . COPD Neg Hx     ALLERGIES:  is allergic to atorvastatin.  MEDICATIONS:  Current Outpatient Medications  Medication Sig Dispense Refill  . ASPIRIN 81 PO Take by mouth daily. 81 mg tablet    . budesonide-formoterol (SYMBICORT) 160-4.5 MCG/ACT inhaler Inhale 2 puffs into the lungs 2 (two) times daily. 1 Inhaler 12  . clopidogrel (PLAVIX) 75 MG tablet Take 1 tablet (75 mg total) by mouth daily. 90 tablet 3  . diphenhydrAMINE (BENADRYL) 25 mg capsule Take 25 mg by mouth at bedtime as needed.    . ferrous sulfate 325 (65 FE)  MG EC tablet Take 1 tablet (325 mg total) by mouth daily with breakfast. 90 tablet 4  . levothyroxine (SYNTHROID) 200 MCG tablet Take 1 tablet (200 mcg total) by mouth daily. 30 tablet 1  . lidocaine-prilocaine (EMLA) cream Apply to affected area once 30 g 3  . lisinopril (ZESTRIL) 20 MG tablet Take 1 tablet (20 mg total) by mouth daily. 90 tablet 1  . Multiple Vitamin (MULTIVITAMIN) tablet Take 1 tablet by mouth daily.    . prochlorperazine (COMPAZINE) 10 MG tablet Take 1 tablet (10 mg total) by mouth every 6 (six) hours as needed (Nausea or vomiting). 30 tablet 1  . vitamin B-12 (CYANOCOBALAMIN) 500 MCG tablet Take 500 mcg by mouth daily.     No current facility-administered medications for this visit.     PHYSICAL EXAMINATION: ECOG PERFORMANCE STATUS: 0 - Asymptomatic There were no vitals filed for this visit. There were no vitals  filed for this visit.  Physical Exam Constitutional:      General: She is not in acute distress. HENT:     Head: Normocephalic and atraumatic.  Eyes:     General: No scleral icterus.    Pupils: Pupils are equal, round, and reactive to light.  Cardiovascular:     Rate and Rhythm: Normal rate and regular rhythm.     Heart sounds: Normal heart sounds.  Pulmonary:     Effort: Pulmonary effort is normal. No respiratory distress.     Breath sounds: No wheezing.  Abdominal:     General: Bowel sounds are normal. There is no distension.     Palpations: Abdomen is soft. There is no mass.     Tenderness: There is no abdominal tenderness.  Musculoskeletal:        General: No deformity. Normal range of motion.     Cervical back: Normal range of motion and neck supple.  Skin:    General: Skin is warm and dry.     Findings: No erythema or rash.  Neurological:     Mental Status: She is alert and oriented to person, place, and time.     Cranial Nerves: No cranial nerve deficit.     Coordination: Coordination normal.  Psychiatric:        Behavior: Behavior normal.        Thought Content: Thought content normal.     LABORATORY DATA:  I have reviewed the data as listed Lab Results  Component Value Date   WBC 9.3 01/01/2019   HGB 12.5 01/01/2019   HCT 37.0 01/01/2019   MCV 95.6 01/01/2019   PLT 365 01/01/2019   Recent Labs    07/11/18 1541 07/11/18 1541 12/31/18 1024 01/01/19 1152 01/22/19 1115  NA 133*  --   --  125* 131*  K 4.6  --   --  4.3 4.5  CL 94*  --   --  96* 99  CO2 22  --   --  24 25  GLUCOSE 72  --   --  99 99  BUN 12  --   --  11 14  CREATININE 0.86   < > 0.90 0.84 0.88  CALCIUM 9.7  --   --  8.8* 9.3  GFRNONAA 68  --   --  >60 >60  GFRAA 79  --   --  >60 >60  PROT 7.1  --   --  7.9  --   ALBUMIN 4.7  --   --  4.4  --   AST 13  --   --  16  --   ALT 9  --   --  14  --   ALKPHOS 80  --   --  70  --   BILITOT 0.3  --   --  0.5  --    < > = values in this  interval not displayed.   Iron/TIBC/Ferritin/ %Sat    Component Value Date/Time   IRON 55 10/31/2016 1546   TIBC 339 10/31/2016 1546   FERRITIN 18 10/31/2016 1546   IRONPCTSAT 16 10/31/2016 1546      RADIOGRAPHIC STUDIES: I have personally reviewed the radiological images as listed and agreed with the findings in the report. CT CHEST W CONTRAST  Result Date: 12/31/2018 CLINICAL DATA:  Neoplasm of the cervix, staging. EXAM: CT CHEST, ABDOMEN, AND PELVIS WITH CONTRAST TECHNIQUE: Multidetector CT imaging of the chest, abdomen and pelvis was performed following the standard protocol during bolus administration of intravenous contrast. CONTRAST:  14m OMNIPAQUE IOHEXOL 300 MG/ML  SOLN COMPARISON:  Chest CT dated 02/02/2015. FINDINGS: CT CHEST FINDINGS Cardiovascular: Aortic atherosclerosis. No thoracic aortic aneurysm. Heart size is normal. No pericardial effusion. Three-vessel coronary artery calcifications. Mediastinum/Nodes: No mass or enlarged lymph nodes seen within the mediastinum or perihilar regions. Esophagus is unremarkable. Trachea and central bronchi are unremarkable. Lungs/Pleura: Chronic pleuroparenchymal scarring/fibrosis at the lung apices. Irregular pulmonary nodule within the RIGHT upper lobe measures 7 mm greatest dimension (series 4, images 56 and 57). Emphysematous changes within the upper lobes bilaterally, at least moderate in degree. Musculoskeletal: Mild degenerative spondylosis of the thoracic spine. No acute or suspicious osseous lesion. CT ABDOMEN PELVIS FINDINGS Hepatobiliary: No mass or lesion is seen within the liver. Gallbladder appears normal. No bile duct dilatation seen. Pancreas: Unremarkable. No pancreatic ductal dilatation or surrounding inflammatory changes. Spleen: Normal in size without focal abnormality. Adrenals/Urinary Tract: Adrenal glands appear normal. Kidneys are unremarkable without suspicious mass, stone or hydronephrosis. Small RIGHT renal cyst. Bladder  is unremarkable, partially decompressed. Stomach/Bowel: No dilated large or small bowel loops. No evidence of bowel wall inflammation. Fairly large amount of stool throughout the nondistended colon. Retrocolic appendix appears normal. Stomach is unremarkable. Vascular/Lymphatic: Aortic atherosclerosis. No enlarged lymph nodes seen. Reproductive: Fluid distending the endometrial canal and or endometrial thickening to the level of the cervix. No adnexal mass or free fluid seen. Haziness about the cervix, of uncertain significance. Other: No free fluid or abscess collection. No free intraperitoneal air. Musculoskeletal: Mild degenerative spondylosis of the lumbar spine. No acute or suspicious osseous finding. IMPRESSION: 1. Fluid distending the endometrial canal and or endometrial thickening to the level of the cervix, corresponding to the given history of cervical cancer. No evidence of pathologic lymphadenopathy or distant metastasis within the abdomen or pelvis. 2. Haziness about the lower cervix which could represent local contiguous spread in the setting of cervical cancer. Would consider pelvic MRI for more definitive characterization of the soft tissues immediately adjacent to the cervix. 3. 7 mm irregular pulmonary nodule within the RIGHT upper lobe, suspicious for possible primary or metastatic malignancy. This nodule is likely too small to be further characterized with PET-CT, but PET-CT might be helpful. 4. Chronic pleuroparenchymal scarring/fibrosis at the lung apices. 5. Fairly large amount of stool throughout the nondistended colon (constipation? ). No bowel obstruction or evidence of bowel wall inflammation. Aortic Atherosclerosis (ICD10-I70.0) and Emphysema (ICD10-J43.9). Electronically Signed   By: SFranki CabotM.D.   On: 12/31/2018 14:22   CT ABDOMEN PELVIS W CONTRAST  Result Date: 12/31/2018 CLINICAL DATA:  Neoplasm of the cervix, staging. EXAM: CT CHEST, ABDOMEN, AND PELVIS WITH CONTRAST  TECHNIQUE: Multidetector CT imaging of the chest, abdomen and pelvis was performed following the standard protocol during bolus administration of intravenous contrast. CONTRAST:  153m OMNIPAQUE IOHEXOL 300 MG/ML  SOLN COMPARISON:  Chest CT dated 02/02/2015. FINDINGS: CT CHEST FINDINGS Cardiovascular: Aortic atherosclerosis. No thoracic aortic aneurysm. Heart size is normal. No pericardial effusion. Three-vessel coronary artery calcifications. Mediastinum/Nodes: No mass or enlarged lymph nodes seen within the mediastinum or perihilar regions. Esophagus is unremarkable. Trachea and central bronchi are unremarkable. Lungs/Pleura: Chronic pleuroparenchymal scarring/fibrosis at the lung apices. Irregular pulmonary nodule within the RIGHT upper lobe measures 7 mm greatest dimension (series 4, images 56 and 57). Emphysematous changes within the upper lobes bilaterally, at least moderate in degree. Musculoskeletal: Mild degenerative spondylosis of the thoracic spine. No acute or suspicious osseous lesion. CT ABDOMEN PELVIS FINDINGS Hepatobiliary: No mass or lesion is seen within the liver. Gallbladder appears normal. No bile duct dilatation seen. Pancreas: Unremarkable. No pancreatic ductal dilatation or surrounding inflammatory changes. Spleen: Normal in size without focal abnormality. Adrenals/Urinary Tract: Adrenal glands appear normal. Kidneys are unremarkable without suspicious mass, stone or hydronephrosis. Small RIGHT renal cyst. Bladder is unremarkable, partially decompressed. Stomach/Bowel: No dilated large or small bowel loops. No evidence of bowel wall inflammation. Fairly large amount of stool throughout the nondistended colon. Retrocolic appendix appears normal. Stomach is unremarkable. Vascular/Lymphatic: Aortic atherosclerosis. No enlarged lymph nodes seen. Reproductive: Fluid distending the endometrial canal and or endometrial thickening to the level of the cervix. No adnexal mass or free fluid seen.  Haziness about the cervix, of uncertain significance. Other: No free fluid or abscess collection. No free intraperitoneal air. Musculoskeletal: Mild degenerative spondylosis of the lumbar spine. No acute or suspicious osseous finding. IMPRESSION: 1. Fluid distending the endometrial canal and or endometrial thickening to the level of the cervix, corresponding to the given history of cervical cancer. No evidence of pathologic lymphadenopathy or distant metastasis within the abdomen or pelvis. 2. Haziness about the lower cervix which could represent local contiguous spread in the setting of cervical cancer. Would consider pelvic MRI for more definitive characterization of the soft tissues immediately adjacent to the cervix. 3. 7 mm irregular pulmonary nodule within the RIGHT upper lobe, suspicious for possible primary or metastatic malignancy. This nodule is likely too small to be further characterized with PET-CT, but PET-CT might be helpful. 4. Chronic pleuroparenchymal scarring/fibrosis at the lung apices. 5. Fairly large amount of stool throughout the nondistended colon (constipation? ). No bowel obstruction or evidence of bowel wall inflammation. Aortic Atherosclerosis (ICD10-I70.0) and Emphysema (ICD10-J43.9). Electronically Signed   By: SFranki CabotM.D.   On: 12/31/2018 14:22   NM PET Image Initial (PI) Skull Base To Thigh  Result Date: 01/08/2019 CLINICAL DATA:  Initial treatment strategy for cervical cancer. EXAM: NUCLEAR MEDICINE PET SKULL BASE TO THIGH TECHNIQUE: 7.9 mCi F-18 FDG was injected intravenously. Full-ring PET imaging was performed from the skull base to thigh after the radiotracer. CT data was obtained and used for attenuation correction and anatomic localization. Fasting blood glucose: 80 mg/dl COMPARISON:  Chest abdomen pelvis CT 12/31/2018. FINDINGS: Mediastinal blood pool activity: SUV max 2.2 Liver activity: SUV max NA NECK: No hypermetabolic lymph nodes in the neck. Incidental CT  findings: none CHEST: No hypermetabolic mediastinal or hilar nodes. The 7 mm nodule in the right upper lobe (image 83/3) shows discernible FDG accumulation with SUV max = 1.5. Incidental CT findings: Centrilobular emphsyema noted.  Coronary artery calcification is evident. Atherosclerotic calcification is noted in the wall of the thoracic aorta. ABDOMEN/PELVIS: No abnormal hypermetabolic activity within the liver, pancreas, adrenal glands, or spleen. No hypermetabolic lymph nodes in the abdomen. 7 mm right pelvic sidewall lymph node visible on image 219/series 3 demonstrates SUV max = 3.0. Similar irregular 7 mm short axis left pelvic sidewall lymph node on 217/3 demonstrates SUV max = 3.4. There is marked hypermetabolism in the lower uterine segment/cervical region. Incidental CT findings: There is abdominal aortic atherosclerosis without aneurysm. Left colonic diverticulosis without diverticulitis. SKELETON: No focal hypermetabolic activity to suggest skeletal metastasis. Incidental CT findings: none IMPRESSION: 1. Marked hypermetabolism in the region of the cervix, compatible with the reported history of cervical cancer. 2. Small bilateral pelvic sidewall lymph nodes show discernible FDG accumulation, concerning for metastatic disease although neither lymph node is enlarged by CT size criteria. 3. 7 mm nodule in the right upper lobe shows discernible FDG accumulation. Given the discernible uptake in a tiny lung nodule of this size, neoplasm is a distinct consideration. Primary bronchogenic carcinoma or metastatic disease could have this appearance. 4.  Aortic Atherosclerois (ICD10-170.0) 5.  Emphysema. (BWI20-B55.9) Electronically Signed   By: Misty Stanley M.D.   On: 01/08/2019 14:08      ASSESSMENT & PLAN:  1. Malignant neoplasm of cervix, unspecified site ([place])   2. Goals of care, counseling/discussion   3. Encounter for antineoplastic chemotherapy   4. Lung nodules    #Locally advanced cervical  cancer stage IIIC1 with pelvic nodes involvement. Starting concurrent chemo radiation today. Discussed with patient about using weekly carboplatin as radiosensitizer. I explained to the patient the risks and benefits of chemotherapy including all but not limited to infusion reaction, hair loss, hearing loss, mouth sore, nausea, vomiting, low blood counts, bleeding, heart failure, kidney failure and risk of life threatening infection and even death, secondary malignancy etc.   Patient voices understanding and willing to proceed chemotherapy.    #Lung nodule, FDG avid, questionable primary bronchogenic carcinoma versus metastatic cancer. Discussed with radiation oncology.  Her case was also discussed on tumor board.  Consensus reached on finishing concurrent chemoradiation treatments for cervix cancer first. Possible SBRT to lung nodule for presumed primary lung cancer.  #Goals of care, curative if lung nodule represents primary lung neoplasm.  Palliative if no nodule presents metastatic disease. Tobacco abuse smoke cessation was discussed with patient in details.   All questions were answered. The patient knows to call the clinic with any problems questions or concerns.   Return of visit:  1 week.    [name], MD, PhD Hematology Oncology [facility] Pager- [phone] 02/20/2019

## 2019-02-20 NOTE — Progress Notes (Signed)
Patient here for follow up. No concerns voiced.  °

## 2019-02-21 ENCOUNTER — Other Ambulatory Visit: Payer: Self-pay

## 2019-02-21 ENCOUNTER — Ambulatory Visit
Admission: RE | Admit: 2019-02-21 | Discharge: 2019-02-21 | Disposition: A | Discharge status: Home / Self Care | Payer: Medicare Other | Source: Ambulatory Visit | Attending: Radiation Oncology | Admitting: Radiation Oncology

## 2019-02-21 ENCOUNTER — Telehealth: Payer: Self-pay

## 2019-02-21 DIAGNOSIS — Z510 Encounter for antineoplastic radiation therapy: Secondary | ICD-10-CM | POA: Diagnosis not present

## 2019-02-21 DIAGNOSIS — C539 Malignant neoplasm of cervix uteri, unspecified: Secondary | ICD-10-CM | POA: Diagnosis not present

## 2019-02-21 NOTE — Telephone Encounter (Signed)
Telephone call to patient for follow up after receiving first infusion yesterday.  Patient not at home but son states patient did well.  Son states patient eating and drinking fluids will with no complaints.  Encouraged son to call for any questions or concerns.

## 2019-02-24 ENCOUNTER — Other Ambulatory Visit: Payer: Self-pay

## 2019-02-24 ENCOUNTER — Ambulatory Visit
Admission: RE | Admit: 2019-02-24 | Discharge: 2019-02-24 | Disposition: A | Discharge status: Home / Self Care | Payer: Medicare Other | Source: Ambulatory Visit | Attending: Radiation Oncology | Admitting: Radiation Oncology

## 2019-02-24 DIAGNOSIS — C539 Malignant neoplasm of cervix uteri, unspecified: Secondary | ICD-10-CM | POA: Diagnosis not present

## 2019-02-24 DIAGNOSIS — Z510 Encounter for antineoplastic radiation therapy: Secondary | ICD-10-CM | POA: Diagnosis not present

## 2019-02-25 ENCOUNTER — Ambulatory Visit
Admission: RE | Admit: 2019-02-25 | Discharge: 2019-02-25 | Disposition: A | Discharge status: Home / Self Care | Payer: Medicare Other | Source: Ambulatory Visit | Attending: Radiation Oncology | Admitting: Radiation Oncology

## 2019-02-25 ENCOUNTER — Other Ambulatory Visit: Payer: Self-pay

## 2019-02-25 DIAGNOSIS — Z510 Encounter for antineoplastic radiation therapy: Secondary | ICD-10-CM | POA: Diagnosis not present

## 2019-02-25 DIAGNOSIS — C539 Malignant neoplasm of cervix uteri, unspecified: Secondary | ICD-10-CM | POA: Diagnosis not present

## 2019-02-26 ENCOUNTER — Ambulatory Visit
Admission: RE | Admit: 2019-02-26 | Discharge: 2019-02-26 | Disposition: A | Discharge status: Home / Self Care | Payer: Medicare Other | Source: Ambulatory Visit | Attending: Radiation Oncology | Admitting: Radiation Oncology

## 2019-02-26 ENCOUNTER — Other Ambulatory Visit: Payer: Self-pay

## 2019-02-26 DIAGNOSIS — C539 Malignant neoplasm of cervix uteri, unspecified: Secondary | ICD-10-CM | POA: Diagnosis not present

## 2019-02-26 DIAGNOSIS — Z510 Encounter for antineoplastic radiation therapy: Secondary | ICD-10-CM | POA: Diagnosis not present

## 2019-02-27 ENCOUNTER — Inpatient Hospital Stay: Payer: Medicare Other

## 2019-02-27 ENCOUNTER — Other Ambulatory Visit: Payer: Self-pay

## 2019-02-27 ENCOUNTER — Encounter: Payer: Self-pay | Admitting: Oncology

## 2019-02-27 ENCOUNTER — Ambulatory Visit
Admission: RE | Admit: 2019-02-27 | Discharge: 2019-02-27 | Disposition: A | Discharge status: Home / Self Care | Payer: Medicare Other | Source: Ambulatory Visit | Attending: Radiation Oncology | Admitting: Radiation Oncology

## 2019-02-27 ENCOUNTER — Inpatient Hospital Stay (HOSPITAL_BASED_OUTPATIENT_CLINIC_OR_DEPARTMENT_OTHER): Payer: Medicare Other | Admitting: Oncology

## 2019-02-27 VITALS — BP 136/61 | HR 76 | Temp 97.3°F | Resp 18 | Wt 135.5 lb

## 2019-02-27 DIAGNOSIS — C539 Malignant neoplasm of cervix uteri, unspecified: Secondary | ICD-10-CM

## 2019-02-27 DIAGNOSIS — E785 Hyperlipidemia, unspecified: Secondary | ICD-10-CM | POA: Diagnosis not present

## 2019-02-27 DIAGNOSIS — I10 Essential (primary) hypertension: Secondary | ICD-10-CM | POA: Diagnosis not present

## 2019-02-27 DIAGNOSIS — E039 Hypothyroidism, unspecified: Secondary | ICD-10-CM | POA: Diagnosis not present

## 2019-02-27 DIAGNOSIS — Z79899 Other long term (current) drug therapy: Secondary | ICD-10-CM | POA: Diagnosis not present

## 2019-02-27 DIAGNOSIS — Z7951 Long term (current) use of inhaled steroids: Secondary | ICD-10-CM | POA: Diagnosis not present

## 2019-02-27 DIAGNOSIS — Z510 Encounter for antineoplastic radiation therapy: Secondary | ICD-10-CM | POA: Diagnosis not present

## 2019-02-27 DIAGNOSIS — Z5111 Encounter for antineoplastic chemotherapy: Secondary | ICD-10-CM | POA: Diagnosis not present

## 2019-02-27 DIAGNOSIS — E871 Hypo-osmolality and hyponatremia: Secondary | ICD-10-CM

## 2019-02-27 DIAGNOSIS — Z7982 Long term (current) use of aspirin: Secondary | ICD-10-CM | POA: Diagnosis not present

## 2019-02-27 DIAGNOSIS — R918 Other nonspecific abnormal finding of lung field: Secondary | ICD-10-CM

## 2019-02-27 DIAGNOSIS — H9190 Unspecified hearing loss, unspecified ear: Secondary | ICD-10-CM | POA: Diagnosis not present

## 2019-02-27 DIAGNOSIS — J449 Chronic obstructive pulmonary disease, unspecified: Secondary | ICD-10-CM | POA: Diagnosis not present

## 2019-02-27 HISTORY — DX: Hypo-osmolality and hyponatremia: E87.1

## 2019-02-27 LAB — CBC WITH DIFFERENTIAL/PLATELET
Abs Immature Granulocytes: 0.02 10*3/uL (ref 0.00–0.07)
Basophils Absolute: 0 10*3/uL (ref 0.0–0.1)
Basophils Relative: 1 %
Eosinophils Absolute: 0.2 10*3/uL (ref 0.0–0.5)
Eosinophils Relative: 4 %
HCT: 33.9 % — ABNORMAL LOW (ref 36.0–46.0)
Hemoglobin: 11.5 g/dL — ABNORMAL LOW (ref 12.0–15.0)
Immature Granulocytes: 0 %
Lymphocytes Relative: 11 %
Lymphs Abs: 0.6 10*3/uL — ABNORMAL LOW (ref 0.7–4.0)
MCH: 32.5 pg (ref 26.0–34.0)
MCHC: 33.9 g/dL (ref 30.0–36.0)
MCV: 95.8 fL (ref 80.0–100.0)
Monocytes Absolute: 0.3 10*3/uL (ref 0.1–1.0)
Monocytes Relative: 7 %
Neutro Abs: 4.1 10*3/uL (ref 1.7–7.7)
Neutrophils Relative %: 77 %
Platelets: 270 10*3/uL (ref 150–400)
RBC: 3.54 MIL/uL — ABNORMAL LOW (ref 3.87–5.11)
RDW: 11.8 % (ref 11.5–15.5)
WBC: 5.2 10*3/uL (ref 4.0–10.5)
nRBC: 0 % (ref 0.0–0.2)

## 2019-02-27 LAB — COMPREHENSIVE METABOLIC PANEL
ALT: 12 U/L (ref 0–44)
AST: 15 U/L (ref 15–41)
Albumin: 4 g/dL (ref 3.5–5.0)
Alkaline Phosphatase: 61 U/L (ref 38–126)
Anion gap: 8 (ref 5–15)
BUN: 12 mg/dL (ref 8–23)
CO2: 24 mmol/L (ref 22–32)
Calcium: 9.1 mg/dL (ref 8.9–10.3)
Chloride: 96 mmol/L — ABNORMAL LOW (ref 98–111)
Creatinine, Ser: 0.95 mg/dL (ref 0.44–1.00)
GFR calc Af Amer: >60 mL/min (ref >60)
GFR calc non Af Amer: >60 mL/min (ref >60)
Glucose, Bld: 105 mg/dL — ABNORMAL HIGH (ref 70–99)
Potassium: 5.3 mmol/L — ABNORMAL HIGH (ref 3.5–5.1)
Sodium: 128 mmol/L — ABNORMAL LOW (ref 135–145)
Total Bilirubin: 0.7 mg/dL (ref 0.3–1.2)
Total Protein: 7.2 g/dL (ref 6.5–8.1)

## 2019-02-27 MED ORDER — PALONOSETRON HCL INJECTION 0.25 MG/5ML
0.2500 mg | Freq: Once | INTRAVENOUS | Status: AC
  Administered 2019-02-27: 10:00:00 0.25 mg via INTRAVENOUS
  Filled 2019-02-27: qty 5

## 2019-02-27 MED ORDER — SODIUM CHLORIDE 0.9 % IV SOLN
10.0000 mg | Freq: Once | INTRAVENOUS | Status: AC
  Administered 2019-02-27: 10:00:00 10 mg via INTRAVENOUS
  Filled 2019-02-27: qty 10

## 2019-02-27 MED ORDER — SODIUM CHLORIDE 0.9 % IV SOLN
152.8000 mg | Freq: Once | INTRAVENOUS | Status: AC
  Administered 2019-02-27: 150 mg via INTRAVENOUS
  Filled 2019-02-27: qty 15

## 2019-02-27 MED ORDER — SODIUM CHLORIDE 0.9 % IV SOLN
Freq: Once | INTRAVENOUS | Status: AC
  Filled 2019-02-27: qty 250

## 2019-02-27 NOTE — Progress Notes (Signed)
Hematology/Oncology note [facility] Telephone:([phone] [phone]   Patient Care Team: [name], DO as PCP - General (Family Medicine) Clent Jacks, RN as Oncology Nurse Navigator  REFERRING PROVIDER: [name], DO  CHIEF COMPLAINTS/REASON FOR VISIT:  Follow up cervical cancer  HISTORY OF PRESENTING ILLNESS:   Mary Hoover is a  74 y.o.  female with PMH listed below was seen in consultation at the request of  [name], DO  for evaluation of cervical cancer Patient has developed postmenopausal vaginal bleeding and discharge.  She was noted to have a friable cervix/2 cm mass of the cervix, concerning for malignancy 12/19/2018 endometrial biopsy showed scattered atypical squamous cells suspicious for malignancy.  Predominantly necrosis with associated inflammation. 01/01/2019 initial cervix biopsy showed at least high-grade squamous intraepithelial lesion.-HPV negative 01/22/2019, repeat vaginal wall biopsy and cervix 9:00 biopsy showed squamous cell carcinoma.    Staging images 12/31/2018 showed fluid distending the endometrial canal and or endometrial thickening to the level of cervix.  No evidence of pathological lymphadenopathy.  Haziness about the lower cervix.  7 mm irregular pulmonary nodule within the right upper lobe, suspicious for possible primary or metastatic malignancy.  Chronic pleural-parenchymal scarring/fibrosis at the lung apices.  Large amount of stool.  No bowel obstruction. 01/08/2019 PET scan showed marked hypermetabolism in the region of the cervix, compatible with the reported history of cervical cancer. Small bilateral pelvic sidewall lymph nodes discernible FDG accumulation, concerning for metastatic disease although neither lymph node is enlarged by CT size criteria. 7 mm nodule in the right upper lobe shows discernible FDG accumulation.  Questionable neoplasm, primary versus metastatic. Emphysema.     #Chronic hearing loss INTERVAL HISTORY Mary Hoover is a 74 y.o. female who has above history reviewed by me today presents for follow up visit for management of low  advanced cervix cancer Problems and complaints are listed below: Today patient denies any new complaints. She is starting concurrent radiation and chemotherapy today. Patient tolerates first cycle of carboplatin.  She reports feeling mild nausea after the treatment.  No vomiting.  Denies any fever or chills.    Review of Systems  Constitutional: Negative for appetite change, chills, fatigue and fever.  HENT:   Positive for hearing loss. Negative for voice change.   Eyes: Negative for eye problems.  Respiratory: Negative for chest tightness and cough.   Cardiovascular: Negative for chest pain.  Gastrointestinal: Negative for abdominal distention, abdominal pain and blood in stool.  Endocrine: Negative for hot flashes.  Genitourinary: Negative for difficulty urinating and frequency.   Musculoskeletal: Negative for arthralgias.  Skin: Negative for itching and rash.  Neurological: Negative for extremity weakness.  Hematological: Negative for adenopathy.  Psychiatric/Behavioral: Negative for confusion.    MEDICAL HISTORY:  Past Medical History:  Diagnosis Date  . Carotid atherosclerosis   . Carotid bruit   . COPD (chronic obstructive pulmonary disease) (HCC)    emphysema  . Hyperlipidemia   . Hypertension   . Hyponatremia 02/27/2019  . Hypothyroidism   . Infected cat bite 1980s   was hospitalized  . Tobacco use     SURGICAL HISTORY: Past Surgical History:  Procedure Laterality Date  . CAROTID ENDARTERECTOMY Right Oct. 2015   Dr. Lucky Cowboy  . CAROTID STENOSIS Left April 2016   carotid stenosis surgery  . COLONOSCOPY WITH PROPOFOL N/A 11/08/2016   Procedure: COLONOSCOPY WITH PROPOFOL;  Surgeon: Jonathon Bellows, MD;  Location: [facility] ENDOSCOPY;  Service: Gastroenterology;  Laterality: N/A;  .  ESOPHAGOGASTRODUODENOSCOPY (EGD) WITH PROPOFOL N/A 11/08/2016   Procedure: ESOPHAGOGASTRODUODENOSCOPY (EGD) WITH PROPOFOL;  Surgeon: Jonathon Bellows, MD;  Location: [facility] ENDOSCOPY;  Service: Gastroenterology;  Laterality: N/A;  . FLEXIBLE BRONCHOSCOPY N/A 12/17/2014   Procedure: FLEXIBLE BRONCHOSCOPY;  Surgeon: Wilhelmina Mcardle, MD;  Location: [facility] ORS;  Service: Pulmonary;  Laterality: N/A;  . INCISION AND DRAINAGE / EXCISION THYROGLOSSAL CYST  April 2011    SOCIAL HISTORY: Social History   Socioeconomic History  . Marital status: Married    Spouse name: Not on file  . Number of children: Not on file  . Years of education: Not on file  . Highest education level: 9th grade  Occupational History  . Occupation: retired   Tobacco Use  . Smoking status: Current Every Day Smoker    Packs/day: 1.50    Years: 50.00    Pack years: 75.00    Types: Cigarettes  . Smokeless tobacco: Never Used  Substance and Sexual Activity  . Alcohol use: No    Alcohol/week: 0.0 standard drinks  . Drug use: No  . Sexual activity: Yes  Other Topics Concern  . Not on file  Social History Narrative   Lives at home with husband   Social Determinants of Health   Financial Resource Strain:   . Difficulty of Paying Living Expenses: Not on file  Food Insecurity:   . Worried About Charity fundraiser in the Last Year: Not on file  . Ran Out of Food in the Last Year: Not on file  Transportation Needs:   . Lack of Transportation (Medical): Not on file  . Lack of Transportation (Non-Medical): Not on file  Physical Activity:   . Days of Exercise per Week: Not on file  . Minutes of Exercise per Session: Not on file  Stress:   . Feeling of Stress : Not on file  Social Connections:   . Frequency of Communication with Friends and Family: Not on file  . Frequency of Social Gatherings with Friends and Family: Not on file  . Attends Religious Services: Not on file  . Active Member of Clubs or Organizations: Not on  file  . Attends Archivist Meetings: Not on file  . Marital Status: Not on file  Intimate Partner Violence:   . Fear of Current or Ex-Partner: Not on file  . Emotionally Abused: Not on file  . Physically Abused: Not on file  . Sexually Abused: Not on file    FAMILY HISTORY: Family History  Problem Relation Age of Onset  . Congestive Heart Failure Mother   . Heart disease Mother   . Hypertension Mother   . Hypertension Sister   . Diabetes Sister   . Heart disease Sister   . Cancer Brother        liver, lung  . Heart disease Maternal Grandmother   . Heart disease Maternal Uncle   . Stroke Maternal Grandfather   . Heart disease Brother   . Hypertension Brother   . Heart attack Brother   . COPD Neg Hx     ALLERGIES:  is allergic to atorvastatin.  MEDICATIONS:  Current Outpatient Medications  Medication Sig Dispense Refill  . ASPIRIN 81 PO Take by mouth daily. 81 mg tablet    . budesonide-formoterol (SYMBICORT) 160-4.5 MCG/ACT inhaler Inhale 2 puffs into the lungs 2 (two) times daily. 1 Inhaler 12  . clopidogrel (PLAVIX) 75 MG tablet Take 1 tablet (75 mg total) by mouth daily. [place]  tablet 3  . diphenhydrAMINE (BENADRYL) 25 mg capsule Take 25 mg by mouth at bedtime as needed.    . ferrous sulfate 325 (65 FE) MG EC tablet Take 1 tablet (325 mg total) by mouth daily with breakfast. 90 tablet 4  . levothyroxine (SYNTHROID) 200 MCG tablet Take 1 tablet (200 mcg total) by mouth daily. 30 tablet 1  . lidocaine-prilocaine (EMLA) cream Apply to affected area once 30 g 3  . lisinopril (ZESTRIL) 20 MG tablet Take 1 tablet (20 mg total) by mouth daily. 90 tablet 1  . Multiple Vitamin (MULTIVITAMIN) tablet Take 1 tablet by mouth daily.    . prochlorperazine (COMPAZINE) 10 MG tablet Take 1 tablet (10 mg total) by mouth every 6 (six) hours as needed (Nausea or vomiting). 30 tablet 1  . vitamin B-12 (CYANOCOBALAMIN) 500 MCG tablet Take 500 mcg by mouth daily.     No current  facility-administered medications for this visit.     PHYSICAL EXAMINATION: ECOG PERFORMANCE STATUS: 1 - Symptomatic but completely ambulatory Vitals:   02/27/19 0846  BP: 136/61  Pulse: 76  Resp: 18  Temp: (!) 97.3 F (36.3 C)   Filed Weights   02/27/19 0846  Weight: 135 lb 8 oz (61.5 kg)    Physical Exam Constitutional:      General: She is not in acute distress. HENT:     Head: Normocephalic and atraumatic.  Eyes:     General: No scleral icterus.    Pupils: Pupils are equal, round, and reactive to light.  Cardiovascular:     Rate and Rhythm: Normal rate and regular rhythm.     Heart sounds: Normal heart sounds.  Pulmonary:     Effort: Pulmonary effort is normal. No respiratory distress.     Breath sounds: No wheezing.     Comments: Decreased breath sound bilaterally  Abdominal:     General: Bowel sounds are normal. There is no distension.     Palpations: Abdomen is soft. There is no mass.     Tenderness: There is no abdominal tenderness.  Musculoskeletal:        General: No deformity. Normal range of motion.     Cervical back: Normal range of motion and neck supple.  Skin:    General: Skin is warm and dry.     Findings: No erythema or rash.  Neurological:     Mental Status: She is alert and oriented to person, place, and time. Mental status is at baseline.     Cranial Nerves: No cranial nerve deficit.     Coordination: Coordination normal.  Psychiatric:        Mood and Affect: Mood normal.     LABORATORY DATA:  I have reviewed the data as listed Lab Results  Component Value Date   WBC 5.2 02/27/2019   HGB 11.5 (L) 02/27/2019   HCT 33.9 (L) 02/27/2019   MCV 95.8 02/27/2019   PLT 270 02/27/2019   Recent Labs    01/01/19 1152 01/01/19 1152 01/22/19 1115 02/20/19 0817 02/27/19 0810  NA 125*   < > 131* 130* 128*  K 4.3   < > 4.5 4.5 5.3*  CL 96*   < > 99 97* 96*  CO2 24   < > _0 GLUCOSE 99   < > 99 103* 105*  BUN 11   < > _1 CREATININE 0.84   < > 0.88 0.81 0.95  CALCIUM 8.8*   < >  9.3 9.2 9.1  GFRNONAA >60   < > >60 >60 >60  GFRAA >60   < > >60 >60 >60  PROT 7.9  --   --  7.2 7.2  ALBUMIN 4.4  --   --  4.0 4.0  AST 16  --   --  16 15  ALT 14  --   --  11 12  ALKPHOS 70  --   --  66 61  BILITOT 0.5  --   --  0.5 0.7  BILIDIR  --   --   --  <0.1  --   IBILI  --   --   --  NOT CALCULATED  --    < > = values in this interval not displayed.   Iron/TIBC/Ferritin/ %Sat    Component Value Date/Time   IRON 55 10/31/2016 1546   TIBC 339 10/31/2016 1546   FERRITIN 18 10/31/2016 1546   IRONPCTSAT 16 10/31/2016 1546      RADIOGRAPHIC STUDIES: I have personally reviewed the radiological images as listed and agreed with the findings in the report. CT CHEST W CONTRAST  Result Date: 12/31/2018 CLINICAL DATA:  Neoplasm of the cervix, staging. EXAM: CT CHEST, ABDOMEN, AND PELVIS WITH CONTRAST TECHNIQUE: Multidetector CT imaging of the chest, abdomen and pelvis was performed following the standard protocol during bolus administration of intravenous contrast. CONTRAST:  173m OMNIPAQUE IOHEXOL 300 MG/ML  SOLN COMPARISON:  Chest CT dated 02/02/2015. FINDINGS: CT CHEST FINDINGS Cardiovascular: Aortic atherosclerosis. No thoracic aortic aneurysm. Heart size is normal. No pericardial effusion. Three-vessel coronary artery calcifications. Mediastinum/Nodes: No mass or enlarged lymph nodes seen within the mediastinum or perihilar regions. Esophagus is unremarkable. Trachea and central bronchi are unremarkable. Lungs/Pleura: Chronic pleuroparenchymal scarring/fibrosis at the lung apices. Irregular pulmonary nodule within the RIGHT upper lobe measures 7 mm greatest dimension (series 4, images 56 and 57). Emphysematous changes within the upper lobes bilaterally, at least moderate in degree. Musculoskeletal: Mild degenerative spondylosis of the thoracic spine. No acute or suspicious osseous lesion. CT ABDOMEN PELVIS FINDINGS  Hepatobiliary: No mass or lesion is seen within the liver. Gallbladder appears normal. No bile duct dilatation seen. Pancreas: Unremarkable. No pancreatic ductal dilatation or surrounding inflammatory changes. Spleen: Normal in size without focal abnormality. Adrenals/Urinary Tract: Adrenal glands appear normal. Kidneys are unremarkable without suspicious mass, stone or hydronephrosis. Small RIGHT renal cyst. Bladder is unremarkable, partially decompressed. Stomach/Bowel: No dilated large or small bowel loops. No evidence of bowel wall inflammation. Fairly large amount of stool throughout the nondistended colon. Retrocolic appendix appears normal. Stomach is unremarkable. Vascular/Lymphatic: Aortic atherosclerosis. No enlarged lymph nodes seen. Reproductive: Fluid distending the endometrial canal and or endometrial thickening to the level of the cervix. No adnexal mass or free fluid seen. Haziness about the cervix, of uncertain significance. Other: No free fluid or abscess collection. No free intraperitoneal air. Musculoskeletal: Mild degenerative spondylosis of the lumbar spine. No acute or suspicious osseous finding. IMPRESSION: 1. Fluid distending the endometrial canal and or endometrial thickening to the level of the cervix, corresponding to the given history of cervical cancer. No evidence of pathologic lymphadenopathy or distant metastasis within the abdomen or pelvis. 2. Haziness about the lower cervix which could represent local contiguous spread in the setting of cervical cancer. Would consider pelvic MRI for more definitive characterization of the soft tissues immediately adjacent to the cervix. 3. 7 mm irregular pulmonary nodule within the RIGHT upper lobe, suspicious for possible primary or metastatic malignancy. This nodule is likely  too small to be further characterized with PET-CT, but PET-CT might be helpful. 4. Chronic pleuroparenchymal scarring/fibrosis at the lung apices. 5. Fairly large amount of  stool throughout the nondistended colon (constipation? ). No bowel obstruction or evidence of bowel wall inflammation. Aortic Atherosclerosis (ICD10-I70.0) and Emphysema (ICD10-J43.9). Electronically Signed   By: Franki Cabot M.D.   On: 12/31/2018 14:22   CT ABDOMEN PELVIS W CONTRAST  Result Date: 12/31/2018 CLINICAL DATA:  Neoplasm of the cervix, staging. EXAM: CT CHEST, ABDOMEN, AND PELVIS WITH CONTRAST TECHNIQUE: Multidetector CT imaging of the chest, abdomen and pelvis was performed following the standard protocol during bolus administration of intravenous contrast. CONTRAST:  169m OMNIPAQUE IOHEXOL 300 MG/ML  SOLN COMPARISON:  Chest CT dated 02/02/2015. FINDINGS: CT CHEST FINDINGS Cardiovascular: Aortic atherosclerosis. No thoracic aortic aneurysm. Heart size is normal. No pericardial effusion. Three-vessel coronary artery calcifications. Mediastinum/Nodes: No mass or enlarged lymph nodes seen within the mediastinum or perihilar regions. Esophagus is unremarkable. Trachea and central bronchi are unremarkable. Lungs/Pleura: Chronic pleuroparenchymal scarring/fibrosis at the lung apices. Irregular pulmonary nodule within the RIGHT upper lobe measures 7 mm greatest dimension (series 4, images 56 and 57). Emphysematous changes within the upper lobes bilaterally, at least moderate in degree. Musculoskeletal: Mild degenerative spondylosis of the thoracic spine. No acute or suspicious osseous lesion. CT ABDOMEN PELVIS FINDINGS Hepatobiliary: No mass or lesion is seen within the liver. Gallbladder appears normal. No bile duct dilatation seen. Pancreas: Unremarkable. No pancreatic ductal dilatation or surrounding inflammatory changes. Spleen: Normal in size without focal abnormality. Adrenals/Urinary Tract: Adrenal glands appear normal. Kidneys are unremarkable without suspicious mass, stone or hydronephrosis. Small RIGHT renal cyst. Bladder is unremarkable, partially decompressed. Stomach/Bowel: No dilated large  or small bowel loops. No evidence of bowel wall inflammation. Fairly large amount of stool throughout the nondistended colon. Retrocolic appendix appears normal. Stomach is unremarkable. Vascular/Lymphatic: Aortic atherosclerosis. No enlarged lymph nodes seen. Reproductive: Fluid distending the endometrial canal and or endometrial thickening to the level of the cervix. No adnexal mass or free fluid seen. Haziness about the cervix, of uncertain significance. Other: No free fluid or abscess collection. No free intraperitoneal air. Musculoskeletal: Mild degenerative spondylosis of the lumbar spine. No acute or suspicious osseous finding. IMPRESSION: 1. Fluid distending the endometrial canal and or endometrial thickening to the level of the cervix, corresponding to the given history of cervical cancer. No evidence of pathologic lymphadenopathy or distant metastasis within the abdomen or pelvis. 2. Haziness about the lower cervix which could represent local contiguous spread in the setting of cervical cancer. Would consider pelvic MRI for more definitive characterization of the soft tissues immediately adjacent to the cervix. 3. 7 mm irregular pulmonary nodule within the RIGHT upper lobe, suspicious for possible primary or metastatic malignancy. This nodule is likely too small to be further characterized with PET-CT, but PET-CT might be helpful. 4. Chronic pleuroparenchymal scarring/fibrosis at the lung apices. 5. Fairly large amount of stool throughout the nondistended colon (constipation? ). No bowel obstruction or evidence of bowel wall inflammation. Aortic Atherosclerosis (ICD10-I70.0) and Emphysema (ICD10-J43.9). Electronically Signed   By: SFranki CabotM.D.   On: 12/31/2018 14:22   NM PET Image Initial (PI) Skull Base To Thigh  Result Date: 01/08/2019 CLINICAL DATA:  Initial treatment strategy for cervical cancer. EXAM: NUCLEAR MEDICINE PET SKULL BASE TO THIGH TECHNIQUE: 7.9 mCi F-18 FDG was injected  intravenously. Full-ring PET imaging was performed from the skull base to thigh after the radiotracer. CT data was obtained and used  for attenuation correction and anatomic localization. Fasting blood glucose: 80 mg/dl COMPARISON:  Chest abdomen pelvis CT 12/31/2018. FINDINGS: Mediastinal blood pool activity: SUV max 2.2 Liver activity: SUV max NA NECK: No hypermetabolic lymph nodes in the neck. Incidental CT findings: none CHEST: No hypermetabolic mediastinal or hilar nodes. The 7 mm nodule in the right upper lobe (image 83/3) shows discernible FDG accumulation with SUV max = 1.5. Incidental CT findings: Centrilobular emphsyema noted. Coronary artery calcification is evident. Atherosclerotic calcification is noted in the wall of the thoracic aorta. ABDOMEN/PELVIS: No abnormal hypermetabolic activity within the liver, pancreas, adrenal glands, or spleen. No hypermetabolic lymph nodes in the abdomen. 7 mm right pelvic sidewall lymph node visible on image 219/series 3 demonstrates SUV max = 3.0. Similar irregular 7 mm short axis left pelvic sidewall lymph node on 217/3 demonstrates SUV max = 3.4. There is marked hypermetabolism in the lower uterine segment/cervical region. Incidental CT findings: There is abdominal aortic atherosclerosis without aneurysm. Left colonic diverticulosis without diverticulitis. SKELETON: No focal hypermetabolic activity to suggest skeletal metastasis. Incidental CT findings: none IMPRESSION: 1. Marked hypermetabolism in the region of the cervix, compatible with the reported history of cervical cancer. 2. Small bilateral pelvic sidewall lymph nodes show discernible FDG accumulation, concerning for metastatic disease although neither lymph node is enlarged by CT size criteria. 3. 7 mm nodule in the right upper lobe shows discernible FDG accumulation. Given the discernible uptake in a tiny lung nodule of this size, neoplasm is a distinct consideration. Primary bronchogenic carcinoma or  metastatic disease could have this appearance. 4.  Aortic Atherosclerois (ICD10-170.0) 5.  Emphysema. (QAS34-H96.9) Electronically Signed   By: Misty Stanley M.D.   On: 01/08/2019 14:08      ASSESSMENT & PLAN:  1. Malignant neoplasm of cervix, unspecified site ([place])   2. Hyponatremia   3. Encounter for antineoplastic chemotherapy   4. Lung nodules    #Locally advanced cervical cancer stage IIIC1 with pelvic nodes involvement. Currently on concurrent chemoradiation. Labs are reviewed and discussed with patient. Blood counts acceptable to proceed with cycle 2 carboplatin.  #Hyponatremia, possible due to decreased p.o. intake. I will give patient 500 cc IV fluid normal saline today.  Along with her treatments.  #Hyperkalemia, will repeat BMP in 3 days #Lung nodule, FDG avid, questionable primary bronchogenic carcinoma versus metastatic cancer. Discussed with radiation oncology.  Her case was also discussed on tumor board.  Consensus reached on finishing concurrent chemoradiation treatments for cervix cancer first. Possible SBRT to lung nodule for presumed primary lung cancer.   All questions were answered. The patient knows to call the clinic with any problems questions or concerns.   Return of visit:  1 week.    [name], MD, PhD Hematology Oncology [facility] Pager- [phone] 02/27/2019

## 2019-02-27 NOTE — Progress Notes (Signed)
Per Haeather CMA per Dr. Tasia Catchings pt to receive 500 ml NS.  Potassium 5.3, Per Benjamine Mola RN per Dr. Tasia Catchings, MD aware and NNO. Okay to proceed with Carboplatin treatment with 500cc NS over one hour.

## 2019-02-28 ENCOUNTER — Ambulatory Visit
Admission: RE | Admit: 2019-02-28 | Discharge: 2019-02-28 | Disposition: A | Discharge status: Home / Self Care | Payer: Medicare Other | Source: Ambulatory Visit | Attending: Radiation Oncology | Admitting: Radiation Oncology

## 2019-02-28 ENCOUNTER — Other Ambulatory Visit: Payer: Self-pay

## 2019-02-28 ENCOUNTER — Telehealth: Payer: Self-pay

## 2019-02-28 DIAGNOSIS — Z510 Encounter for antineoplastic radiation therapy: Secondary | ICD-10-CM | POA: Diagnosis not present

## 2019-02-28 DIAGNOSIS — C539 Malignant neoplasm of cervix uteri, unspecified: Secondary | ICD-10-CM | POA: Diagnosis not present

## 2019-02-28 NOTE — Telephone Encounter (Signed)
Copied from Person [phone]. Topic: General - Other >> Feb 28, 2019  3:14 PM Sheran Luz wrote: Patient would like to know if she can cancel upcoming lab appointment, as she is having labs at the cancer center. Patient was unaware if Dr. Wynetta Emery would have access to those labs.

## 2019-03-02 NOTE — Telephone Encounter (Signed)
They did not draw her thyroid at the cancer center- I still need to have her get it drawn to change her dose.

## 2019-03-03 ENCOUNTER — Ambulatory Visit
Admission: RE | Admit: 2019-03-03 | Discharge: 2019-03-03 | Disposition: A | Discharge status: Home / Self Care | Payer: Medicare Other | Source: Ambulatory Visit | Attending: Radiation Oncology | Admitting: Radiation Oncology

## 2019-03-03 ENCOUNTER — Other Ambulatory Visit: Payer: Self-pay

## 2019-03-03 ENCOUNTER — Inpatient Hospital Stay: Payer: Medicare Other | Attending: Oncology

## 2019-03-03 DIAGNOSIS — Z510 Encounter for antineoplastic radiation therapy: Secondary | ICD-10-CM | POA: Diagnosis not present

## 2019-03-03 DIAGNOSIS — E871 Hypo-osmolality and hyponatremia: Secondary | ICD-10-CM | POA: Diagnosis not present

## 2019-03-03 DIAGNOSIS — J449 Chronic obstructive pulmonary disease, unspecified: Secondary | ICD-10-CM | POA: Insufficient documentation

## 2019-03-03 DIAGNOSIS — E785 Hyperlipidemia, unspecified: Secondary | ICD-10-CM | POA: Diagnosis not present

## 2019-03-03 DIAGNOSIS — K5730 Diverticulosis of large intestine without perforation or abscess without bleeding: Secondary | ICD-10-CM | POA: Diagnosis not present

## 2019-03-03 DIAGNOSIS — C539 Malignant neoplasm of cervix uteri, unspecified: Secondary | ICD-10-CM | POA: Diagnosis not present

## 2019-03-03 DIAGNOSIS — I10 Essential (primary) hypertension: Secondary | ICD-10-CM | POA: Insufficient documentation

## 2019-03-03 DIAGNOSIS — I700 Atherosclerosis of aorta: Secondary | ICD-10-CM | POA: Diagnosis not present

## 2019-03-03 DIAGNOSIS — R911 Solitary pulmonary nodule: Secondary | ICD-10-CM | POA: Diagnosis not present

## 2019-03-03 DIAGNOSIS — Z5111 Encounter for antineoplastic chemotherapy: Secondary | ICD-10-CM | POA: Diagnosis not present

## 2019-03-03 DIAGNOSIS — Z79899 Other long term (current) drug therapy: Secondary | ICD-10-CM | POA: Diagnosis not present

## 2019-03-03 DIAGNOSIS — F17210 Nicotine dependence, cigarettes, uncomplicated: Secondary | ICD-10-CM | POA: Insufficient documentation

## 2019-03-03 DIAGNOSIS — E039 Hypothyroidism, unspecified: Secondary | ICD-10-CM | POA: Insufficient documentation

## 2019-03-03 DIAGNOSIS — Z7982 Long term (current) use of aspirin: Secondary | ICD-10-CM | POA: Insufficient documentation

## 2019-03-03 LAB — BASIC METABOLIC PANEL
Anion gap: 7 (ref 5–15)
BUN: 13 mg/dL (ref 8–23)
CO2: 25 mmol/L (ref 22–32)
Calcium: 9.1 mg/dL (ref 8.9–10.3)
Chloride: 98 mmol/L (ref 98–111)
Creatinine, Ser: 0.82 mg/dL (ref 0.44–1.00)
GFR calc Af Amer: >60 mL/min (ref >60)
GFR calc non Af Amer: >60 mL/min (ref >60)
Glucose, Bld: 97 mg/dL (ref 70–99)
Potassium: 4.8 mmol/L (ref 3.5–5.1)
Sodium: 130 mmol/L — ABNORMAL LOW (ref 135–145)

## 2019-03-03 NOTE — Telephone Encounter (Signed)
Patient notified and verbalized understanding. 

## 2019-03-04 ENCOUNTER — Other Ambulatory Visit: Payer: Self-pay

## 2019-03-04 ENCOUNTER — Ambulatory Visit
Admission: RE | Admit: 2019-03-04 | Discharge: 2019-03-04 | Disposition: A | Discharge status: Home / Self Care | Payer: Medicare Other | Source: Ambulatory Visit | Attending: Radiation Oncology | Admitting: Radiation Oncology

## 2019-03-04 ENCOUNTER — Other Ambulatory Visit: Payer: Medicare Other

## 2019-03-04 DIAGNOSIS — C539 Malignant neoplasm of cervix uteri, unspecified: Secondary | ICD-10-CM | POA: Diagnosis not present

## 2019-03-04 DIAGNOSIS — Z510 Encounter for antineoplastic radiation therapy: Secondary | ICD-10-CM | POA: Diagnosis not present

## 2019-03-04 DIAGNOSIS — E039 Hypothyroidism, unspecified: Secondary | ICD-10-CM

## 2019-03-05 ENCOUNTER — Other Ambulatory Visit: Payer: Self-pay

## 2019-03-05 ENCOUNTER — Ambulatory Visit
Admission: RE | Admit: 2019-03-05 | Discharge: 2019-03-05 | Disposition: A | Discharge status: Home / Self Care | Payer: Medicare Other | Source: Ambulatory Visit | Attending: Radiation Oncology | Admitting: Radiation Oncology

## 2019-03-05 DIAGNOSIS — Z510 Encounter for antineoplastic radiation therapy: Secondary | ICD-10-CM | POA: Diagnosis not present

## 2019-03-05 DIAGNOSIS — C539 Malignant neoplasm of cervix uteri, unspecified: Secondary | ICD-10-CM | POA: Diagnosis not present

## 2019-03-05 LAB — TSH: TSH: 0.076 u[IU]/mL — ABNORMAL LOW (ref 0.450–4.500)

## 2019-03-06 ENCOUNTER — Encounter: Payer: Self-pay | Admitting: Oncology

## 2019-03-06 ENCOUNTER — Inpatient Hospital Stay (HOSPITAL_BASED_OUTPATIENT_CLINIC_OR_DEPARTMENT_OTHER): Payer: Medicare Other | Admitting: Oncology

## 2019-03-06 ENCOUNTER — Other Ambulatory Visit: Payer: Self-pay

## 2019-03-06 ENCOUNTER — Inpatient Hospital Stay: Payer: Medicare Other

## 2019-03-06 ENCOUNTER — Telehealth: Payer: Self-pay | Admitting: Family Medicine

## 2019-03-06 ENCOUNTER — Ambulatory Visit
Admission: RE | Admit: 2019-03-06 | Discharge: 2019-03-06 | Disposition: A | Discharge status: Home / Self Care | Payer: Medicare Other | Source: Ambulatory Visit | Attending: Radiation Oncology | Admitting: Radiation Oncology

## 2019-03-06 VITALS — BP 129/69 | HR 72 | Temp 97.5°F | Resp 18 | Wt 135.5 lb

## 2019-03-06 DIAGNOSIS — E785 Hyperlipidemia, unspecified: Secondary | ICD-10-CM | POA: Diagnosis not present

## 2019-03-06 DIAGNOSIS — E871 Hypo-osmolality and hyponatremia: Secondary | ICD-10-CM

## 2019-03-06 DIAGNOSIS — R911 Solitary pulmonary nodule: Secondary | ICD-10-CM | POA: Diagnosis not present

## 2019-03-06 DIAGNOSIS — C539 Malignant neoplasm of cervix uteri, unspecified: Secondary | ICD-10-CM

## 2019-03-06 DIAGNOSIS — Z79899 Other long term (current) drug therapy: Secondary | ICD-10-CM | POA: Diagnosis not present

## 2019-03-06 DIAGNOSIS — Z5111 Encounter for antineoplastic chemotherapy: Secondary | ICD-10-CM

## 2019-03-06 DIAGNOSIS — E034 Atrophy of thyroid (acquired): Secondary | ICD-10-CM

## 2019-03-06 DIAGNOSIS — K5730 Diverticulosis of large intestine without perforation or abscess without bleeding: Secondary | ICD-10-CM | POA: Diagnosis not present

## 2019-03-06 DIAGNOSIS — Z510 Encounter for antineoplastic radiation therapy: Secondary | ICD-10-CM | POA: Diagnosis not present

## 2019-03-06 DIAGNOSIS — Z7982 Long term (current) use of aspirin: Secondary | ICD-10-CM | POA: Diagnosis not present

## 2019-03-06 DIAGNOSIS — E039 Hypothyroidism, unspecified: Secondary | ICD-10-CM | POA: Diagnosis not present

## 2019-03-06 DIAGNOSIS — J449 Chronic obstructive pulmonary disease, unspecified: Secondary | ICD-10-CM | POA: Diagnosis not present

## 2019-03-06 DIAGNOSIS — I700 Atherosclerosis of aorta: Secondary | ICD-10-CM | POA: Diagnosis not present

## 2019-03-06 DIAGNOSIS — I10 Essential (primary) hypertension: Secondary | ICD-10-CM | POA: Diagnosis not present

## 2019-03-06 LAB — COMPREHENSIVE METABOLIC PANEL
ALT: 12 U/L (ref 0–44)
AST: 16 U/L (ref 15–41)
Albumin: 3.9 g/dL (ref 3.5–5.0)
Alkaline Phosphatase: 56 U/L (ref 38–126)
Anion gap: 9 (ref 5–15)
BUN: 16 mg/dL (ref 8–23)
CO2: 23 mmol/L (ref 22–32)
Calcium: 9.1 mg/dL (ref 8.9–10.3)
Chloride: 97 mmol/L — ABNORMAL LOW (ref 98–111)
Creatinine, Ser: 0.97 mg/dL (ref 0.44–1.00)
GFR calc Af Amer: >60 mL/min (ref >60)
GFR calc non Af Amer: 59 mL/min — ABNORMAL LOW (ref >60)
Glucose, Bld: 100 mg/dL — ABNORMAL HIGH (ref 70–99)
Potassium: 4.6 mmol/L (ref 3.5–5.1)
Sodium: 129 mmol/L — ABNORMAL LOW (ref 135–145)
Total Bilirubin: 0.7 mg/dL (ref 0.3–1.2)
Total Protein: 7 g/dL (ref 6.5–8.1)

## 2019-03-06 LAB — CBC WITH DIFFERENTIAL/PLATELET
Abs Immature Granulocytes: 0.01 10*3/uL (ref 0.00–0.07)
Basophils Absolute: 0 10*3/uL (ref 0.0–0.1)
Basophils Relative: 1 %
Eosinophils Absolute: 0.3 10*3/uL (ref 0.0–0.5)
Eosinophils Relative: 6 %
HCT: 33.4 % — ABNORMAL LOW (ref 36.0–46.0)
Hemoglobin: 11.2 g/dL — ABNORMAL LOW (ref 12.0–15.0)
Immature Granulocytes: 0 %
Lymphocytes Relative: 9 %
Lymphs Abs: 0.4 10*3/uL — ABNORMAL LOW (ref 0.7–4.0)
MCH: 32.2 pg (ref 26.0–34.0)
MCHC: 33.5 g/dL (ref 30.0–36.0)
MCV: 96 fL (ref 80.0–100.0)
Monocytes Absolute: 0.4 10*3/uL (ref 0.1–1.0)
Monocytes Relative: 8 %
Neutro Abs: 3.7 10*3/uL (ref 1.7–7.7)
Neutrophils Relative %: 76 %
Platelets: 177 10*3/uL (ref 150–400)
RBC: 3.48 MIL/uL — ABNORMAL LOW (ref 3.87–5.11)
RDW: 12.2 % (ref 11.5–15.5)
WBC: 4.8 10*3/uL (ref 4.0–10.5)
nRBC: 0 % (ref 0.0–0.2)

## 2019-03-06 MED ORDER — SODIUM CHLORIDE 0.9 % IV SOLN
10.0000 mg | Freq: Once | INTRAVENOUS | Status: AC
  Administered 2019-03-06: 10 mg via INTRAVENOUS
  Filled 2019-03-06: qty 1

## 2019-03-06 MED ORDER — PALONOSETRON HCL INJECTION 0.25 MG/5ML
0.2500 mg | Freq: Once | INTRAVENOUS | Status: AC
  Administered 2019-03-06: 0.25 mg via INTRAVENOUS
  Filled 2019-03-06: qty 5

## 2019-03-06 MED ORDER — LEVOTHYROXINE SODIUM 175 MCG PO TABS: 175.0000 ug | ORAL_TABLET | Freq: Every day | ORAL | 1 refills | Status: DC

## 2019-03-06 MED ORDER — SODIUM CHLORIDE 0.9 % IV SOLN
Freq: Once | INTRAVENOUS | Status: AC
  Filled 2019-03-06: qty 250

## 2019-03-06 MED ORDER — SODIUM CHLORIDE 0.9 % IV SOLN
152.8000 mg | Freq: Once | INTRAVENOUS | Status: AC
  Administered 2019-03-06: 11:00:00 150 mg via INTRAVENOUS
  Filled 2019-03-06: qty 15

## 2019-03-06 NOTE — Telephone Encounter (Signed)
Patient notified, lab appt scheduled.

## 2019-03-06 NOTE — Progress Notes (Signed)
Patient does not offer any problems today.  

## 2019-03-06 NOTE — Telephone Encounter (Signed)
Please let her know that we've over treated her thyroid just a little. i'd like her to decrease down to 133mcg and we'll recheck in 6 weeks. Thanks! (order in)

## 2019-03-06 NOTE — Progress Notes (Signed)
Hematology/Oncology note [facility] Telephone:([phone] [phone]   Patient Care Team: [name], DO as PCP - General (Family Medicine) Clent Jacks, RN as Oncology Nurse Navigator  REFERRING PROVIDER: [name], DO  CHIEF COMPLAINTS/REASON FOR VISIT:  Follow up cervical cancer  HISTORY OF PRESENTING ILLNESS:   Mary Hoover is a  74 y.o.  female with PMH listed below was seen in consultation at the request of  [name], DO  for evaluation of cervical cancer Patient has developed postmenopausal vaginal bleeding and discharge.  She was noted to have a friable cervix/2 cm mass of the cervix, concerning for malignancy 12/19/2018 endometrial biopsy showed scattered atypical squamous cells suspicious for malignancy.  Predominantly necrosis with associated inflammation. 01/01/2019 initial cervix biopsy showed at least high-grade squamous intraepithelial lesion.-HPV negative 01/22/2019, repeat vaginal wall biopsy and cervix 9:00 biopsy showed squamous cell carcinoma.    Staging images 12/31/2018 showed fluid distending the endometrial canal and or endometrial thickening to the level of cervix.  No evidence of pathological lymphadenopathy.  Haziness about the lower cervix.  7 mm irregular pulmonary nodule within the right upper lobe, suspicious for possible primary or metastatic malignancy.  Chronic pleural-parenchymal scarring/fibrosis at the lung apices.  Large amount of stool.  No bowel obstruction. 01/08/2019 PET scan showed marked hypermetabolism in the region of the cervix, compatible with the reported history of cervical cancer. Small bilateral pelvic sidewall lymph nodes discernible FDG accumulation, concerning for metastatic disease although neither lymph node is enlarged by CT size criteria. 7 mm nodule in the right upper lobe shows discernible FDG accumulation.  Questionable neoplasm, primary versus metastatic. Emphysema.     #Chronic hearing loss INTERVAL HISTORY Mary Hoover is a 74 y.o. female who has above history reviewed by me today presents for follow up visit for management of low  advanced cervix cancer Problems and complaints are listed below: Patient denies any new complaints. Patient tolerates concurrent chemo and radiation. She reports that she drinks a lot of fluid including 4 cups (about 8 ounces each cup) of coffee in the morning, 26 ounces of water, sometimes another 4 cups of decaf coffee in the afternoon and Gatorade. Appetite is good.  Weight has been stable.   Review of Systems  Constitutional: Negative for appetite change, chills, fatigue and fever.  HENT:   Positive for hearing loss. Negative for voice change.   Eyes: Negative for eye problems.  Respiratory: Negative for chest tightness and cough.   Cardiovascular: Negative for chest pain.  Gastrointestinal: Negative for abdominal distention, abdominal pain and blood in stool.  Endocrine: Negative for hot flashes.  Genitourinary: Negative for difficulty urinating and frequency.   Musculoskeletal: Negative for arthralgias.  Skin: Negative for itching and rash.  Neurological: Negative for extremity weakness.  Hematological: Negative for adenopathy.  Psychiatric/Behavioral: Negative for confusion.    MEDICAL HISTORY:  Past Medical History:  Diagnosis Date  . Carotid atherosclerosis   . Carotid bruit   . COPD (chronic obstructive pulmonary disease) (HCC)    emphysema  . Hyperlipidemia   . Hypertension   . Hyponatremia 02/27/2019  . Hypothyroidism   . Infected cat bite 1980s   was hospitalized  . Tobacco use     SURGICAL HISTORY: Past Surgical History:  Procedure Laterality Date  . CAROTID ENDARTERECTOMY Right Oct. 2015   Dr. Lucky Cowboy  . CAROTID STENOSIS Left April 2016   carotid stenosis surgery  . COLONOSCOPY WITH PROPOFOL N/A 11/08/2016  Procedure: COLONOSCOPY WITH PROPOFOL;  Surgeon: Jonathon Bellows, MD;  Location:  [facility] ENDOSCOPY;  Service: Gastroenterology;  Laterality: N/A;  . ESOPHAGOGASTRODUODENOSCOPY (EGD) WITH PROPOFOL N/A 11/08/2016   Procedure: ESOPHAGOGASTRODUODENOSCOPY (EGD) WITH PROPOFOL;  Surgeon: Jonathon Bellows, MD;  Location: [facility] ENDOSCOPY;  Service: Gastroenterology;  Laterality: N/A;  . FLEXIBLE BRONCHOSCOPY N/A 12/17/2014   Procedure: FLEXIBLE BRONCHOSCOPY;  Surgeon: Wilhelmina Mcardle, MD;  Location: [facility] ORS;  Service: Pulmonary;  Laterality: N/A;  . INCISION AND DRAINAGE / EXCISION THYROGLOSSAL CYST  April 2011    SOCIAL HISTORY: Social History   Socioeconomic History  . Marital status: Married    Spouse name: Not on file  . Number of children: Not on file  . Years of education: Not on file  . Highest education level: 9th grade  Occupational History  . Occupation: retired   Tobacco Use  . Smoking status: Current Every Day Smoker    Packs/day: 1.50    Years: 50.00    Pack years: 75.00    Types: Cigarettes  . Smokeless tobacco: Never Used  Substance and Sexual Activity  . Alcohol use: No    Alcohol/week: 0.0 standard drinks  . Drug use: No  . Sexual activity: Yes  Other Topics Concern  . Not on file  Social History Narrative   Lives at home with husband   Social Determinants of Health   Financial Resource Strain:   . Difficulty of Paying Living Expenses: Not on file  Food Insecurity:   . Worried About Charity fundraiser in the Last Year: Not on file  . Ran Out of Food in the Last Year: Not on file  Transportation Needs:   . Lack of Transportation (Medical): Not on file  . Lack of Transportation (Non-Medical): Not on file  Physical Activity:   . Days of Exercise per Week: Not on file  . Minutes of Exercise per Session: Not on file  Stress:   . Feeling of Stress : Not on file  Social Connections:   . Frequency of Communication with Friends and Family: Not on file  . Frequency of Social Gatherings with Friends and Family: Not on file  . Attends Religious  Services: Not on file  . Active Member of Clubs or Organizations: Not on file  . Attends Archivist Meetings: Not on file  . Marital Status: Not on file  Intimate Partner Violence:   . Fear of Current or Ex-Partner: Not on file  . Emotionally Abused: Not on file  . Physically Abused: Not on file  . Sexually Abused: Not on file    FAMILY HISTORY: Family History  Problem Relation Age of Onset  . Congestive Heart Failure Mother   . Heart disease Mother   . Hypertension Mother   . Hypertension Sister   . Diabetes Sister   . Heart disease Sister   . Cancer Brother        liver, lung  . Heart disease Maternal Grandmother   . Heart disease Maternal Uncle   . Stroke Maternal Grandfather   . Heart disease Brother   . Hypertension Brother   . Heart attack Brother   . COPD Neg Hx     ALLERGIES:  is allergic to atorvastatin.  MEDICATIONS:  Current Outpatient Medications  Medication Sig Dispense Refill  . ASPIRIN 81 PO Take by mouth daily. 81 mg tablet    . budesonide-formoterol (SYMBICORT) 160-4.5 MCG/ACT inhaler Inhale 2 puffs into the lungs 2 (two) times daily. 1 Inhaler 12  .  clopidogrel (PLAVIX) 75 MG tablet Take 1 tablet (75 mg total) by mouth daily. 90 tablet 3  . diphenhydrAMINE (BENADRYL) 25 mg capsule Take 25 mg by mouth at bedtime as needed.    . ferrous sulfate 325 (65 FE) MG EC tablet Take 1 tablet (325 mg total) by mouth daily with breakfast. 90 tablet 4  . levothyroxine (SYNTHROID) 200 MCG tablet Take 1 tablet (200 mcg total) by mouth daily. 30 tablet 1  . lidocaine-prilocaine (EMLA) cream Apply to affected area once 30 g 3  . lisinopril (ZESTRIL) 20 MG tablet Take 1 tablet (20 mg total) by mouth daily. 90 tablet 1  . Multiple Vitamin (MULTIVITAMIN) tablet Take 1 tablet by mouth daily.    . prochlorperazine (COMPAZINE) 10 MG tablet Take 1 tablet (10 mg total) by mouth every 6 (six) hours as needed (Nausea or vomiting). 30 tablet 1  . vitamin B-12  (CYANOCOBALAMIN) 500 MCG tablet Take 500 mcg by mouth daily.     No current facility-administered medications for this visit.     PHYSICAL EXAMINATION: ECOG PERFORMANCE STATUS: 1 - Symptomatic but completely ambulatory Vitals:   03/06/19 0915  BP: 129/69  Pulse: 72  Resp: 18  Temp: (!) 97.5 F (36.4 C)   Filed Weights   03/06/19 0915  Weight: 135 lb 8 oz (61.5 kg)    Physical Exam Constitutional:      General: She is not in acute distress. HENT:     Head: Normocephalic and atraumatic.  Eyes:     General: No scleral icterus.    Pupils: Pupils are equal, round, and reactive to light.  Cardiovascular:     Rate and Rhythm: Normal rate and regular rhythm.     Heart sounds: Normal heart sounds.  Pulmonary:     Effort: Pulmonary effort is normal. No respiratory distress.     Breath sounds: No wheezing.     Comments: Decreased breath sound bilaterally  Abdominal:     General: Bowel sounds are normal. There is no distension.     Palpations: Abdomen is soft. There is no mass.     Tenderness: There is no abdominal tenderness.  Musculoskeletal:        General: No deformity. Normal range of motion.     Cervical back: Normal range of motion and neck supple.  Skin:    General: Skin is warm and dry.     Findings: No erythema or rash.  Neurological:     Mental Status: She is alert and oriented to person, place, and time. Mental status is at baseline.     Cranial Nerves: No cranial nerve deficit.     Coordination: Coordination normal.  Psychiatric:        Mood and Affect: Mood normal.     LABORATORY DATA:  I have reviewed the data as listed Lab Results  Component Value Date   WBC 4.8 03/06/2019   HGB 11.2 (L) 03/06/2019   HCT 33.4 (L) 03/06/2019   MCV 96.0 03/06/2019   PLT 177 03/06/2019   Recent Labs    02/20/19 0817 02/20/19 0817 02/27/19 0810 03/03/19 1257 03/06/19 0826  NA 130*   < > 128* 130* 129*  K 4.5   < > 5.3* 4.8 4.6  CL 97*   < > 96* 98 97*  CO2 23    < > _0 GLUCOSE 103*   < > 105* 97 100*  BUN 13   < > _1 CREATININE 0.81   < >  0.95 0.82 0.97  CALCIUM 9.2   < > 9.1 9.1 9.1  GFRNONAA >60   < > >60 >60 59*  GFRAA >60   < > >60 >60 >60  PROT 7.2  --  7.2  --  7.0  ALBUMIN 4.0  --  4.0  --  3.9  AST 16  --  15  --  16  ALT 11  --  12  --  12  ALKPHOS 66  --  61  --  56  BILITOT 0.5  --  0.7  --  0.7  BILIDIR <0.1  --   --   --   --   IBILI NOT CALCULATED  --   --   --   --    < > = values in this interval not displayed.   Iron/TIBC/Ferritin/ %Sat    Component Value Date/Time   IRON 55 10/31/2016 1546   TIBC 339 10/31/2016 1546   FERRITIN 18 10/31/2016 1546   IRONPCTSAT 16 10/31/2016 1546      RADIOGRAPHIC STUDIES: I have personally reviewed the radiological images as listed and agreed with the findings in the report. CT CHEST W CONTRAST  Result Date: 12/31/2018 CLINICAL DATA:  Neoplasm of the cervix, staging. EXAM: CT CHEST, ABDOMEN, AND PELVIS WITH CONTRAST TECHNIQUE: Multidetector CT imaging of the chest, abdomen and pelvis was performed following the standard protocol during bolus administration of intravenous contrast. CONTRAST:  128m OMNIPAQUE IOHEXOL 300 MG/ML  SOLN COMPARISON:  Chest CT dated 02/02/2015. FINDINGS: CT CHEST FINDINGS Cardiovascular: Aortic atherosclerosis. No thoracic aortic aneurysm. Heart size is normal. No pericardial effusion. Three-vessel coronary artery calcifications. Mediastinum/Nodes: No mass or enlarged lymph nodes seen within the mediastinum or perihilar regions. Esophagus is unremarkable. Trachea and central bronchi are unremarkable. Lungs/Pleura: Chronic pleuroparenchymal scarring/fibrosis at the lung apices. Irregular pulmonary nodule within the RIGHT upper lobe measures 7 mm greatest dimension (series 4, images 56 and 57). Emphysematous changes within the upper lobes bilaterally, at least moderate in degree. Musculoskeletal: Mild degenerative spondylosis of the thoracic spine. No  acute or suspicious osseous lesion. CT ABDOMEN PELVIS FINDINGS Hepatobiliary: No mass or lesion is seen within the liver. Gallbladder appears normal. No bile duct dilatation seen. Pancreas: Unremarkable. No pancreatic ductal dilatation or surrounding inflammatory changes. Spleen: Normal in size without focal abnormality. Adrenals/Urinary Tract: Adrenal glands appear normal. Kidneys are unremarkable without suspicious mass, stone or hydronephrosis. Small RIGHT renal cyst. Bladder is unremarkable, partially decompressed. Stomach/Bowel: No dilated large or small bowel loops. No evidence of bowel wall inflammation. Fairly large amount of stool throughout the nondistended colon. Retrocolic appendix appears normal. Stomach is unremarkable. Vascular/Lymphatic: Aortic atherosclerosis. No enlarged lymph nodes seen. Reproductive: Fluid distending the endometrial canal and or endometrial thickening to the level of the cervix. No adnexal mass or free fluid seen. Haziness about the cervix, of uncertain significance. Other: No free fluid or abscess collection. No free intraperitoneal air. Musculoskeletal: Mild degenerative spondylosis of the lumbar spine. No acute or suspicious osseous finding. IMPRESSION: 1. Fluid distending the endometrial canal and or endometrial thickening to the level of the cervix, corresponding to the given history of cervical cancer. No evidence of pathologic lymphadenopathy or distant metastasis within the abdomen or pelvis. 2. Haziness about the lower cervix which could represent local contiguous spread in the setting of cervical cancer. Would consider pelvic MRI for more definitive characterization of the soft tissues immediately adjacent to the cervix. 3. 7 mm irregular pulmonary nodule within the RIGHT upper lobe, suspicious  for possible primary or metastatic malignancy. This nodule is likely too small to be further characterized with PET-CT, but PET-CT might be helpful. 4. Chronic pleuroparenchymal  scarring/fibrosis at the lung apices. 5. Fairly large amount of stool throughout the nondistended colon (constipation? ). No bowel obstruction or evidence of bowel wall inflammation. Aortic Atherosclerosis (ICD10-I70.0) and Emphysema (ICD10-J43.9). Electronically Signed   By: Franki Cabot M.D.   On: 12/31/2018 14:22   CT ABDOMEN PELVIS W CONTRAST  Result Date: 12/31/2018 CLINICAL DATA:  Neoplasm of the cervix, staging. EXAM: CT CHEST, ABDOMEN, AND PELVIS WITH CONTRAST TECHNIQUE: Multidetector CT imaging of the chest, abdomen and pelvis was performed following the standard protocol during bolus administration of intravenous contrast. CONTRAST:  13m OMNIPAQUE IOHEXOL 300 MG/ML  SOLN COMPARISON:  Chest CT dated 02/02/2015. FINDINGS: CT CHEST FINDINGS Cardiovascular: Aortic atherosclerosis. No thoracic aortic aneurysm. Heart size is normal. No pericardial effusion. Three-vessel coronary artery calcifications. Mediastinum/Nodes: No mass or enlarged lymph nodes seen within the mediastinum or perihilar regions. Esophagus is unremarkable. Trachea and central bronchi are unremarkable. Lungs/Pleura: Chronic pleuroparenchymal scarring/fibrosis at the lung apices. Irregular pulmonary nodule within the RIGHT upper lobe measures 7 mm greatest dimension (series 4, images 56 and 57). Emphysematous changes within the upper lobes bilaterally, at least moderate in degree. Musculoskeletal: Mild degenerative spondylosis of the thoracic spine. No acute or suspicious osseous lesion. CT ABDOMEN PELVIS FINDINGS Hepatobiliary: No mass or lesion is seen within the liver. Gallbladder appears normal. No bile duct dilatation seen. Pancreas: Unremarkable. No pancreatic ductal dilatation or surrounding inflammatory changes. Spleen: Normal in size without focal abnormality. Adrenals/Urinary Tract: Adrenal glands appear normal. Kidneys are unremarkable without suspicious mass, stone or hydronephrosis. Small RIGHT renal cyst. Bladder is  unremarkable, partially decompressed. Stomach/Bowel: No dilated large or small bowel loops. No evidence of bowel wall inflammation. Fairly large amount of stool throughout the nondistended colon. Retrocolic appendix appears normal. Stomach is unremarkable. Vascular/Lymphatic: Aortic atherosclerosis. No enlarged lymph nodes seen. Reproductive: Fluid distending the endometrial canal and or endometrial thickening to the level of the cervix. No adnexal mass or free fluid seen. Haziness about the cervix, of uncertain significance. Other: No free fluid or abscess collection. No free intraperitoneal air. Musculoskeletal: Mild degenerative spondylosis of the lumbar spine. No acute or suspicious osseous finding. IMPRESSION: 1. Fluid distending the endometrial canal and or endometrial thickening to the level of the cervix, corresponding to the given history of cervical cancer. No evidence of pathologic lymphadenopathy or distant metastasis within the abdomen or pelvis. 2. Haziness about the lower cervix which could represent local contiguous spread in the setting of cervical cancer. Would consider pelvic MRI for more definitive characterization of the soft tissues immediately adjacent to the cervix. 3. 7 mm irregular pulmonary nodule within the RIGHT upper lobe, suspicious for possible primary or metastatic malignancy. This nodule is likely too small to be further characterized with PET-CT, but PET-CT might be helpful. 4. Chronic pleuroparenchymal scarring/fibrosis at the lung apices. 5. Fairly large amount of stool throughout the nondistended colon (constipation? ). No bowel obstruction or evidence of bowel wall inflammation. Aortic Atherosclerosis (ICD10-I70.0) and Emphysema (ICD10-J43.9). Electronically Signed   By: SFranki CabotM.D.   On: 12/31/2018 14:22   NM PET Image Initial (PI) Skull Base To Thigh  Result Date: 01/08/2019 CLINICAL DATA:  Initial treatment strategy for cervical cancer. EXAM: NUCLEAR MEDICINE PET  SKULL BASE TO THIGH TECHNIQUE: 7.9 mCi F-18 FDG was injected intravenously. Full-ring PET imaging was performed from the skull base to  thigh after the radiotracer. CT data was obtained and used for attenuation correction and anatomic localization. Fasting blood glucose: 80 mg/dl COMPARISON:  Chest abdomen pelvis CT 12/31/2018. FINDINGS: Mediastinal blood pool activity: SUV max 2.2 Liver activity: SUV max NA NECK: No hypermetabolic lymph nodes in the neck. Incidental CT findings: none CHEST: No hypermetabolic mediastinal or hilar nodes. The 7 mm nodule in the right upper lobe (image 83/3) shows discernible FDG accumulation with SUV max = 1.5. Incidental CT findings: Centrilobular emphsyema noted. Coronary artery calcification is evident. Atherosclerotic calcification is noted in the wall of the thoracic aorta. ABDOMEN/PELVIS: No abnormal hypermetabolic activity within the liver, pancreas, adrenal glands, or spleen. No hypermetabolic lymph nodes in the abdomen. 7 mm right pelvic sidewall lymph node visible on image 219/series 3 demonstrates SUV max = 3.0. Similar irregular 7 mm short axis left pelvic sidewall lymph node on 217/3 demonstrates SUV max = 3.4. There is marked hypermetabolism in the lower uterine segment/cervical region. Incidental CT findings: There is abdominal aortic atherosclerosis without aneurysm. Left colonic diverticulosis without diverticulitis. SKELETON: No focal hypermetabolic activity to suggest skeletal metastasis. Incidental CT findings: none IMPRESSION: 1. Marked hypermetabolism in the region of the cervix, compatible with the reported history of cervical cancer. 2. Small bilateral pelvic sidewall lymph nodes show discernible FDG accumulation, concerning for metastatic disease although neither lymph node is enlarged by CT size criteria. 3. 7 mm nodule in the right upper lobe shows discernible FDG accumulation. Given the discernible uptake in a tiny lung nodule of this size, neoplasm is a  distinct consideration. Primary bronchogenic carcinoma or metastatic disease could have this appearance. 4.  Aortic Atherosclerois (ICD10-170.0) 5.  Emphysema. (IWL79-G92.9) Electronically Signed   By: Misty Stanley M.D.   On: 01/08/2019 14:08      ASSESSMENT & PLAN:  1. Encounter for antineoplastic chemotherapy   2. Hyponatremia   3. Malignant neoplasm of cervix, unspecified site ([place])    #Locally advanced cervical cancer stage IIIC1 with pelvic nodes involvement. Currently on concurrent chemoradiation. Labs are reviewed and discussed with patient. Blood counts are acceptable to proceed with weekly carboplatin.   # Hyponatremia, chronic, check serum osmolarity,  Urinary sodium concentration urine osmolarity  #Lung nodule, FDG avid, questionable primary bronchogenic carcinoma versus metastatic cancer. Discussed with radiation oncology.  Her case was also discussed on tumor board.  Consensus reached on finishing concurrent chemoradiation treatments for cervix cancer first. Possible SBRT to lung nodule for presumed primary lung cancer.   All questions were answered. The patient knows to call the clinic with any problems questions or concerns.   Return of visit:  1 week.    [name], MD, PhD Hematology Oncology [facility] Pager- [phone] 03/06/2019

## 2019-03-07 ENCOUNTER — Other Ambulatory Visit: Payer: Self-pay

## 2019-03-07 ENCOUNTER — Ambulatory Visit
Admission: RE | Admit: 2019-03-07 | Discharge: 2019-03-07 | Disposition: A | Discharge status: Home / Self Care | Payer: Medicare Other | Source: Ambulatory Visit | Attending: Radiation Oncology | Admitting: Radiation Oncology

## 2019-03-07 DIAGNOSIS — C539 Malignant neoplasm of cervix uteri, unspecified: Secondary | ICD-10-CM | POA: Diagnosis not present

## 2019-03-07 DIAGNOSIS — Z510 Encounter for antineoplastic radiation therapy: Secondary | ICD-10-CM | POA: Diagnosis not present

## 2019-03-10 ENCOUNTER — Other Ambulatory Visit: Payer: Self-pay

## 2019-03-10 ENCOUNTER — Telehealth: Payer: Self-pay | Admitting: Family Medicine

## 2019-03-10 ENCOUNTER — Ambulatory Visit
Admission: RE | Admit: 2019-03-10 | Discharge: 2019-03-10 | Disposition: A | Discharge status: Home / Self Care | Payer: Medicare Other | Source: Ambulatory Visit | Attending: Radiation Oncology | Admitting: Radiation Oncology

## 2019-03-10 DIAGNOSIS — Z510 Encounter for antineoplastic radiation therapy: Secondary | ICD-10-CM | POA: Diagnosis not present

## 2019-03-10 DIAGNOSIS — C539 Malignant neoplasm of cervix uteri, unspecified: Secondary | ICD-10-CM | POA: Diagnosis not present

## 2019-03-10 NOTE — Chronic Care Management (AMB) (Signed)
  Chronic Care Management   Outreach Note  03/10/2019 Name: KAIANNA DOLEZAL MRN: [id number] DOB: October 26, 1945  MERLINA MARCHENA is a 74 y.o. year old female who is a primary care patient of [name], DO. I reached out to [name] by phone today in response to a referral sent by Ms. Kyrsten Deleeuw [employer]'s health plan.     An unsuccessful telephone outreach was attempted today. The patient was referred to the case management team for assistance with care management and care coordination.   Follow Up Plan: The care management team will reach out to the patient again over the next 7 days.  If patient returns call to provider office, please advise to call [facility]  at [place], [place], [place], [place] [address] Direct Dial: [phone] [email or web address] Website: [email or web address]

## 2019-03-11 ENCOUNTER — Ambulatory Visit
Admission: RE | Admit: 2019-03-11 | Discharge: 2019-03-11 | Disposition: A | Discharge status: Home / Self Care | Payer: Medicare Other | Source: Ambulatory Visit | Attending: Radiation Oncology | Admitting: Radiation Oncology

## 2019-03-11 ENCOUNTER — Other Ambulatory Visit: Payer: Self-pay

## 2019-03-11 DIAGNOSIS — Z510 Encounter for antineoplastic radiation therapy: Secondary | ICD-10-CM | POA: Diagnosis not present

## 2019-03-11 DIAGNOSIS — C539 Malignant neoplasm of cervix uteri, unspecified: Secondary | ICD-10-CM | POA: Diagnosis not present

## 2019-03-12 ENCOUNTER — Other Ambulatory Visit: Payer: Self-pay

## 2019-03-12 ENCOUNTER — Ambulatory Visit
Admission: RE | Admit: 2019-03-12 | Discharge: 2019-03-12 | Disposition: A | Discharge status: Home / Self Care | Payer: Medicare Other | Source: Ambulatory Visit | Attending: Radiation Oncology | Admitting: Radiation Oncology

## 2019-03-12 DIAGNOSIS — Z510 Encounter for antineoplastic radiation therapy: Secondary | ICD-10-CM | POA: Diagnosis not present

## 2019-03-12 DIAGNOSIS — C539 Malignant neoplasm of cervix uteri, unspecified: Secondary | ICD-10-CM | POA: Diagnosis not present

## 2019-03-12 NOTE — Chronic Care Management (AMB) (Signed)
  Chronic Care Management   Outreach Note  03/12/2019 Name: Mary Hoover MRN: [id number] DOB: 09/09/1945  Mary Hoover is a 74 y.o. year old female who is a primary care patient of [name], DO. I reached out to [name] by phone today in response to a referral sent by Ms. Marcile Fuquay [employer]'s health plan.     A second unsuccessful telephone outreach was attempted today. The patient was referred to the case management team for assistance with care management and care coordination.   Follow Up Plan: A HIPPA compliant phone message was left for the patient providing contact information and requesting a return call.  The care management team will reach out to the patient again over the next 7 days.  If patient returns call to provider office, please advise to call [facility] at [place], [place], [place], [place] [address] Direct Dial: [phone] [email or web address] Website: [email or web address]

## 2019-03-13 ENCOUNTER — Ambulatory Visit
Admission: RE | Admit: 2019-03-13 | Discharge: 2019-03-13 | Disposition: A | Discharge status: Home / Self Care | Payer: Medicare Other | Source: Ambulatory Visit | Attending: Radiation Oncology | Admitting: Radiation Oncology

## 2019-03-13 ENCOUNTER — Inpatient Hospital Stay: Payer: Medicare Other

## 2019-03-13 ENCOUNTER — Other Ambulatory Visit: Payer: Self-pay

## 2019-03-13 ENCOUNTER — Inpatient Hospital Stay (HOSPITAL_BASED_OUTPATIENT_CLINIC_OR_DEPARTMENT_OTHER): Payer: Medicare Other | Admitting: Oncology

## 2019-03-13 ENCOUNTER — Encounter: Payer: Self-pay | Admitting: Oncology

## 2019-03-13 VITALS — BP 133/76 | HR 68 | Temp 97.9°F | Resp 18 | Wt 135.2 lb

## 2019-03-13 DIAGNOSIS — K5730 Diverticulosis of large intestine without perforation or abscess without bleeding: Secondary | ICD-10-CM | POA: Diagnosis not present

## 2019-03-13 DIAGNOSIS — E039 Hypothyroidism, unspecified: Secondary | ICD-10-CM | POA: Diagnosis not present

## 2019-03-13 DIAGNOSIS — R918 Other nonspecific abnormal finding of lung field: Secondary | ICD-10-CM | POA: Diagnosis not present

## 2019-03-13 DIAGNOSIS — C539 Malignant neoplasm of cervix uteri, unspecified: Secondary | ICD-10-CM

## 2019-03-13 DIAGNOSIS — E785 Hyperlipidemia, unspecified: Secondary | ICD-10-CM | POA: Diagnosis not present

## 2019-03-13 DIAGNOSIS — I700 Atherosclerosis of aorta: Secondary | ICD-10-CM | POA: Diagnosis not present

## 2019-03-13 DIAGNOSIS — Z716 Tobacco abuse counseling: Secondary | ICD-10-CM | POA: Insufficient documentation

## 2019-03-13 DIAGNOSIS — Z79899 Other long term (current) drug therapy: Secondary | ICD-10-CM | POA: Diagnosis not present

## 2019-03-13 DIAGNOSIS — J449 Chronic obstructive pulmonary disease, unspecified: Secondary | ICD-10-CM | POA: Diagnosis not present

## 2019-03-13 DIAGNOSIS — E871 Hypo-osmolality and hyponatremia: Secondary | ICD-10-CM

## 2019-03-13 DIAGNOSIS — Z5111 Encounter for antineoplastic chemotherapy: Secondary | ICD-10-CM | POA: Diagnosis not present

## 2019-03-13 DIAGNOSIS — Z7982 Long term (current) use of aspirin: Secondary | ICD-10-CM | POA: Diagnosis not present

## 2019-03-13 DIAGNOSIS — R911 Solitary pulmonary nodule: Secondary | ICD-10-CM | POA: Diagnosis not present

## 2019-03-13 DIAGNOSIS — Z510 Encounter for antineoplastic radiation therapy: Secondary | ICD-10-CM | POA: Diagnosis not present

## 2019-03-13 DIAGNOSIS — I10 Essential (primary) hypertension: Secondary | ICD-10-CM | POA: Diagnosis not present

## 2019-03-13 LAB — CBC WITH DIFFERENTIAL/PLATELET
Abs Immature Granulocytes: 0.02 10*3/uL (ref 0.00–0.07)
Basophils Absolute: 0 10*3/uL (ref 0.0–0.1)
Basophils Relative: 1 %
Eosinophils Absolute: 0.2 10*3/uL (ref 0.0–0.5)
Eosinophils Relative: 4 %
HCT: 31.8 % — ABNORMAL LOW (ref 36.0–46.0)
Hemoglobin: 10.6 g/dL — ABNORMAL LOW (ref 12.0–15.0)
Immature Granulocytes: 1 %
Lymphocytes Relative: 9 %
Lymphs Abs: 0.4 10*3/uL — ABNORMAL LOW (ref 0.7–4.0)
MCH: 32.5 pg (ref 26.0–34.0)
MCHC: 33.3 g/dL (ref 30.0–36.0)
MCV: 97.5 fL (ref 80.0–100.0)
Monocytes Absolute: 0.4 10*3/uL (ref 0.1–1.0)
Monocytes Relative: 10 %
Neutro Abs: 3.2 10*3/uL (ref 1.7–7.7)
Neutrophils Relative %: 75 %
Platelets: 159 10*3/uL (ref 150–400)
RBC: 3.26 MIL/uL — ABNORMAL LOW (ref 3.87–5.11)
RDW: 12.7 % (ref 11.5–15.5)
WBC: 4.2 10*3/uL (ref 4.0–10.5)
nRBC: 0 % (ref 0.0–0.2)

## 2019-03-13 LAB — COMPREHENSIVE METABOLIC PANEL
ALT: 12 U/L (ref 0–44)
AST: 15 U/L (ref 15–41)
Albumin: 3.8 g/dL (ref 3.5–5.0)
Alkaline Phosphatase: 55 U/L (ref 38–126)
Anion gap: 8 (ref 5–15)
BUN: 13 mg/dL (ref 8–23)
CO2: 23 mmol/L (ref 22–32)
Calcium: 8.9 mg/dL (ref 8.9–10.3)
Chloride: 101 mmol/L (ref 98–111)
Creatinine, Ser: 0.78 mg/dL (ref 0.44–1.00)
GFR calc Af Amer: >60 mL/min (ref >60)
GFR calc non Af Amer: >60 mL/min (ref >60)
Glucose, Bld: 95 mg/dL (ref 70–99)
Potassium: 5.1 mmol/L (ref 3.5–5.1)
Sodium: 132 mmol/L — ABNORMAL LOW (ref 135–145)
Total Bilirubin: 0.6 mg/dL (ref 0.3–1.2)
Total Protein: 6.9 g/dL (ref 6.5–8.1)

## 2019-03-13 LAB — OSMOLALITY: Osmolality: 284 mOsm/kg (ref 275–295)

## 2019-03-13 LAB — SODIUM, URINE, RANDOM: Sodium, Ur: 66 mmol/L

## 2019-03-13 LAB — OSMOLALITY, URINE: Osmolality, Ur: 249 mOsm/kg — ABNORMAL LOW (ref 300–900)

## 2019-03-13 MED ORDER — SODIUM CHLORIDE 0.9 % IV SOLN
152.8000 mg | Freq: Once | INTRAVENOUS | Status: AC
  Administered 2019-03-13: 11:00:00 150 mg via INTRAVENOUS
  Filled 2019-03-13: qty 15

## 2019-03-13 MED ORDER — SODIUM CHLORIDE 0.9 % IV SOLN
10.0000 mg | Freq: Once | INTRAVENOUS | Status: AC
  Administered 2019-03-13: 11:00:00 10 mg via INTRAVENOUS
  Filled 2019-03-13: qty 10

## 2019-03-13 MED ORDER — SODIUM CHLORIDE 0.9 % IV SOLN
Freq: Once | INTRAVENOUS | Status: AC
  Filled 2019-03-13: qty 250

## 2019-03-13 MED ORDER — PALONOSETRON HCL INJECTION 0.25 MG/5ML
0.2500 mg | Freq: Once | INTRAVENOUS | Status: AC
  Administered 2019-03-13: 10:00:00 0.25 mg via INTRAVENOUS
  Filled 2019-03-13: qty 5

## 2019-03-13 NOTE — Progress Notes (Signed)
Patient does not offer any problems today.  

## 2019-03-13 NOTE — Progress Notes (Signed)
Hematology/Oncology note [facility] Telephone:([phone] [phone]   Patient Care Team: [name], DO as PCP - General (Family Medicine) Clent Jacks, RN as Oncology Nurse Navigator  REFERRING PROVIDER: [name], DO  CHIEF COMPLAINTS/REASON FOR VISIT:  Follow up cervical cancer  HISTORY OF PRESENTING ILLNESS:   Mary Hoover is a  74 y.o.  female with PMH listed below was seen in consultation at the request of  [name], DO  for evaluation of cervical cancer Patient has developed postmenopausal vaginal bleeding and discharge.  She was noted to have a friable cervix/2 cm mass of the cervix, concerning for malignancy 12/19/2018 endometrial biopsy showed scattered atypical squamous cells suspicious for malignancy.  Predominantly necrosis with associated inflammation. 01/01/2019 initial cervix biopsy showed at least high-grade squamous intraepithelial lesion.-HPV negative 01/22/2019, repeat vaginal wall biopsy and cervix 9:00 biopsy showed squamous cell carcinoma.    Staging images 12/31/2018 showed fluid distending the endometrial canal and or endometrial thickening to the level of cervix.  No evidence of pathological lymphadenopathy.  Haziness about the lower cervix.  7 mm irregular pulmonary nodule within the right upper lobe, suspicious for possible primary or metastatic malignancy.  Chronic pleural-parenchymal scarring/fibrosis at the lung apices.  Large amount of stool.  No bowel obstruction. 01/08/2019 PET scan showed marked hypermetabolism in the region of the cervix, compatible with the reported history of cervical cancer. Small bilateral pelvic sidewall lymph nodes discernible FDG accumulation, concerning for metastatic disease although neither lymph node is enlarged by CT size criteria. 7 mm nodule in the right upper lobe shows discernible FDG accumulation.  Questionable neoplasm, primary versus metastatic. Emphysema.     #Chronic hearing loss # Lung nodule, FDG avid, questionable primary bronchogenic carcinoma versus metastatic cancer. Discussed with radiation oncology.  Her case was also discussed on tumor board.  Consensus reached on finishing concurrent chemoradiation treatments for cervix cancer first. Possible SBRT to lung nodule for presumed primary lung cancer.  INTERVAL HISTORY Mary Hoover is a 74 y.o. female who has above history reviewed by me today presents for follow up visit for management of low  advanced cervix cancer Problems and complaints are listed below: Patient denies any new complaints. She tolerates concurrent chemotherapy and radiation.  She has cut down her coffee and fluid intake in the afternoon. No able to give me accurate estimate of quantity.    Review of Systems  Constitutional: Negative for appetite change, chills, fatigue and fever.  HENT:   Positive for hearing loss. Negative for voice change.   Eyes: Negative for eye problems.  Respiratory: Negative for chest tightness and cough.   Cardiovascular: Negative for chest pain.  Gastrointestinal: Negative for abdominal distention, abdominal pain and blood in stool.  Endocrine: Negative for hot flashes.  Genitourinary: Negative for difficulty urinating and frequency.   Musculoskeletal: Negative for arthralgias.  Skin: Negative for itching and rash.  Neurological: Negative for extremity weakness.  Hematological: Negative for adenopathy.  Psychiatric/Behavioral: Negative for confusion.    MEDICAL HISTORY:  Past Medical History:  Diagnosis Date  . Carotid atherosclerosis   . Carotid bruit   . COPD (chronic obstructive pulmonary disease) (HCC)    emphysema  . Hyperlipidemia   . Hypertension   . Hyponatremia 02/27/2019  . Hypothyroidism   . Infected cat bite 1980s   was hospitalized  . Tobacco use     SURGICAL HISTORY: Past Surgical History:  Procedure Laterality Date  . CAROTID ENDARTERECTOMY Right Oct.  2015   Dr. Lucky Cowboy  . CAROTID STENOSIS Left April 2016   carotid stenosis surgery  . COLONOSCOPY WITH PROPOFOL N/A 11/08/2016   Procedure: COLONOSCOPY WITH PROPOFOL;  Surgeon: Jonathon Bellows, MD;  Location: [facility] ENDOSCOPY;  Service: Gastroenterology;  Laterality: N/A;  . ESOPHAGOGASTRODUODENOSCOPY (EGD) WITH PROPOFOL N/A 11/08/2016   Procedure: ESOPHAGOGASTRODUODENOSCOPY (EGD) WITH PROPOFOL;  Surgeon: Jonathon Bellows, MD;  Location: [facility] ENDOSCOPY;  Service: Gastroenterology;  Laterality: N/A;  . FLEXIBLE BRONCHOSCOPY N/A 12/17/2014   Procedure: FLEXIBLE BRONCHOSCOPY;  Surgeon: Wilhelmina Mcardle, MD;  Location: [facility] ORS;  Service: Pulmonary;  Laterality: N/A;  . INCISION AND DRAINAGE / EXCISION THYROGLOSSAL CYST  April 2011    SOCIAL HISTORY: Social History   Socioeconomic History  . Marital status: Married    Spouse name: Not on file  . Number of children: Not on file  . Years of education: Not on file  . Highest education level: 9th grade  Occupational History  . Occupation: retired   Tobacco Use  . Smoking status: Current Every Day Smoker    Packs/day: 1.50    Years: 50.00    Pack years: 75.00    Types: Cigarettes  . Smokeless tobacco: Never Used  Substance and Sexual Activity  . Alcohol use: No    Alcohol/week: 0.0 standard drinks  . Drug use: No  . Sexual activity: Yes  Other Topics Concern  . Not on file  Social History Narrative   Lives at home with husband   Social Determinants of Health   Financial Resource Strain:   . Difficulty of Paying Living Expenses: Not on file  Food Insecurity:   . Worried About Charity fundraiser in the Last Year: Not on file  . Ran Out of Food in the Last Year: Not on file  Transportation Needs:   . Lack of Transportation (Medical): Not on file  . Lack of Transportation (Non-Medical): Not on file  Physical Activity:   . Days of Exercise per Week: Not on file  . Minutes of Exercise per Session: Not on file  Stress:   . Feeling of Stress :  Not on file  Social Connections:   . Frequency of Communication with Friends and Family: Not on file  . Frequency of Social Gatherings with Friends and Family: Not on file  . Attends Religious Services: Not on file  . Active Member of Clubs or Organizations: Not on file  . Attends Archivist Meetings: Not on file  . Marital Status: Not on file  Intimate Partner Violence:   . Fear of Current or Ex-Partner: Not on file  . Emotionally Abused: Not on file  . Physically Abused: Not on file  . Sexually Abused: Not on file    FAMILY HISTORY: Family History  Problem Relation Age of Onset  . Congestive Heart Failure Mother   . Heart disease Mother   . Hypertension Mother   . Hypertension Sister   . Diabetes Sister   . Heart disease Sister   . Cancer Brother        liver, lung  . Heart disease Maternal Grandmother   . Heart disease Maternal Uncle   . Stroke Maternal Grandfather   . Heart disease Brother   . Hypertension Brother   . Heart attack Brother   . COPD Neg Hx     ALLERGIES:  is allergic to atorvastatin.  MEDICATIONS:  Current Outpatient Medications  Medication Sig Dispense Refill  . ASPIRIN 81 PO Take by mouth daily.  81 mg tablet    . budesonide-formoterol (SYMBICORT) 160-4.5 MCG/ACT inhaler Inhale 2 puffs into the lungs 2 (two) times daily. 1 Inhaler 12  . clopidogrel (PLAVIX) 75 MG tablet Take 1 tablet (75 mg total) by mouth daily. 90 tablet 3  . diphenhydrAMINE (BENADRYL) 25 mg capsule Take 25 mg by mouth at bedtime as needed.    . ferrous sulfate 325 (65 FE) MG EC tablet Take 1 tablet (325 mg total) by mouth daily with breakfast. 90 tablet 4  . levothyroxine (SYNTHROID) 175 MCG tablet Take 1 tablet (175 mcg total) by mouth daily. 30 tablet 1  . lidocaine-prilocaine (EMLA) cream Apply to affected area once 30 g 3  . lisinopril (ZESTRIL) 20 MG tablet Take 1 tablet (20 mg total) by mouth daily. 90 tablet 1  . Multiple Vitamin (MULTIVITAMIN) tablet Take 1  tablet by mouth daily.    . prochlorperazine (COMPAZINE) 10 MG tablet Take 1 tablet (10 mg total) by mouth every 6 (six) hours as needed (Nausea or vomiting). 30 tablet 1  . vitamin B-12 (CYANOCOBALAMIN) 500 MCG tablet Take 500 mcg by mouth daily.     No current facility-administered medications for this visit.     PHYSICAL EXAMINATION: ECOG PERFORMANCE STATUS: 1 - Symptomatic but completely ambulatory Vitals:   03/13/19 0942  BP: 133/76  Pulse: 68  Resp: 18  Temp: 97.9 F (36.6 C)   Filed Weights   03/13/19 0942  Weight: 135 lb 3.2 oz (61.3 kg)    Physical Exam Constitutional:      General: She is not in acute distress. HENT:     Head: Normocephalic and atraumatic.  Eyes:     General: No scleral icterus.    Pupils: Pupils are equal, round, and reactive to light.  Cardiovascular:     Rate and Rhythm: Normal rate and regular rhythm.     Heart sounds: Normal heart sounds.  Pulmonary:     Effort: Pulmonary effort is normal. No respiratory distress.     Breath sounds: No wheezing.     Comments: Decreased breath sound bilaterally  Abdominal:     General: Bowel sounds are normal. There is no distension.     Palpations: Abdomen is soft. There is no mass.     Tenderness: There is no abdominal tenderness.  Musculoskeletal:        General: No deformity. Normal range of motion.     Cervical back: Normal range of motion and neck supple.  Skin:    General: Skin is warm and dry.     Findings: No erythema or rash.  Neurological:     Mental Status: She is alert and oriented to person, place, and time. Mental status is at baseline.     Coordination: Coordination normal.  Psychiatric:        Mood and Affect: Mood normal.     LABORATORY DATA:  I have reviewed the data as listed Lab Results  Component Value Date   WBC 4.2 03/13/2019   HGB 10.6 (L) 03/13/2019   HCT 31.8 (L) 03/13/2019   MCV 97.5 03/13/2019   PLT 159 03/13/2019   Recent Labs    02/20/19 0817  02/20/19 0817 02/27/19 0810 02/27/19 0810 03/03/19 1257 03/06/19 0826 03/13/19 0837  NA 130*   < > 128*   < > 130* 129* 132*  K 4.5   < > 5.3*   < > 4.8 4.6 5.1  CL 97*   < > 96*   < > 98  97* 101  CO2 23   < > 24   < > _0 GLUCOSE 103*   < > 105*   < > 97 100* 95  BUN 13   < > 12   < > _1 CREATININE 0.81   < > 0.95   < > 0.82 0.97 0.78  CALCIUM 9.2   < > 9.1   < > 9.1 9.1 8.9  GFRNONAA >60   < > >60   < > >60 59* >60  GFRAA >60   < > >60   < > >60 >60 >60  PROT 7.2   < > 7.2  --   --  7.0 6.9  ALBUMIN 4.0   < > 4.0  --   --  3.9 3.8  AST 16   < > 15  --   --  16 15  ALT 11   < > 12  --   --  12 12  ALKPHOS 66   < > 61  --   --  56 55  BILITOT 0.5   < > 0.7  --   --  0.7 0.6  BILIDIR <0.1  --   --   --   --   --   --   IBILI NOT CALCULATED  --   --   --   --   --   --    < > = values in this interval not displayed.   Iron/TIBC/Ferritin/ %Sat    Component Value Date/Time   IRON 55 10/31/2016 1546   TIBC 339 10/31/2016 1546   FERRITIN 18 10/31/2016 1546   IRONPCTSAT 16 10/31/2016 1546      RADIOGRAPHIC STUDIES: I have personally reviewed the radiological images as listed and agreed with the findings in the report. CT CHEST W CONTRAST  Result Date: 12/31/2018 CLINICAL DATA:  Neoplasm of the cervix, staging. EXAM: CT CHEST, ABDOMEN, AND PELVIS WITH CONTRAST TECHNIQUE: Multidetector CT imaging of the chest, abdomen and pelvis was performed following the standard protocol during bolus administration of intravenous contrast. CONTRAST:  136m OMNIPAQUE IOHEXOL 300 MG/ML  SOLN COMPARISON:  Chest CT dated 02/02/2015. FINDINGS: CT CHEST FINDINGS Cardiovascular: Aortic atherosclerosis. No thoracic aortic aneurysm. Heart size is normal. No pericardial effusion. Three-vessel coronary artery calcifications. Mediastinum/Nodes: No mass or enlarged lymph nodes seen within the mediastinum or perihilar regions. Esophagus is unremarkable. Trachea and central bronchi are unremarkable.  Lungs/Pleura: Chronic pleuroparenchymal scarring/fibrosis at the lung apices. Irregular pulmonary nodule within the RIGHT upper lobe measures 7 mm greatest dimension (series 4, images 56 and 57). Emphysematous changes within the upper lobes bilaterally, at least moderate in degree. Musculoskeletal: Mild degenerative spondylosis of the thoracic spine. No acute or suspicious osseous lesion. CT ABDOMEN PELVIS FINDINGS Hepatobiliary: No mass or lesion is seen within the liver. Gallbladder appears normal. No bile duct dilatation seen. Pancreas: Unremarkable. No pancreatic ductal dilatation or surrounding inflammatory changes. Spleen: Normal in size without focal abnormality. Adrenals/Urinary Tract: Adrenal glands appear normal. Kidneys are unremarkable without suspicious mass, stone or hydronephrosis. Small RIGHT renal cyst. Bladder is unremarkable, partially decompressed. Stomach/Bowel: No dilated large or small bowel loops. No evidence of bowel wall inflammation. Fairly large amount of stool throughout the nondistended colon. Retrocolic appendix appears normal. Stomach is unremarkable. Vascular/Lymphatic: Aortic atherosclerosis. No enlarged lymph nodes seen. Reproductive: Fluid distending the endometrial canal and or endometrial thickening to the level of the cervix. No adnexal mass or free fluid seen. Haziness  about the cervix, of uncertain significance. Other: No free fluid or abscess collection. No free intraperitoneal air. Musculoskeletal: Mild degenerative spondylosis of the lumbar spine. No acute or suspicious osseous finding. IMPRESSION: 1. Fluid distending the endometrial canal and or endometrial thickening to the level of the cervix, corresponding to the given history of cervical cancer. No evidence of pathologic lymphadenopathy or distant metastasis within the abdomen or pelvis. 2. Haziness about the lower cervix which could represent local contiguous spread in the setting of cervical cancer. Would consider  pelvic MRI for more definitive characterization of the soft tissues immediately adjacent to the cervix. 3. 7 mm irregular pulmonary nodule within the RIGHT upper lobe, suspicious for possible primary or metastatic malignancy. This nodule is likely too small to be further characterized with PET-CT, but PET-CT might be helpful. 4. Chronic pleuroparenchymal scarring/fibrosis at the lung apices. 5. Fairly large amount of stool throughout the nondistended colon (constipation? ). No bowel obstruction or evidence of bowel wall inflammation. Aortic Atherosclerosis (ICD10-I70.0) and Emphysema (ICD10-J43.9). Electronically Signed   By: Franki Cabot M.D.   On: 12/31/2018 14:22   CT ABDOMEN PELVIS W CONTRAST  Result Date: 12/31/2018 CLINICAL DATA:  Neoplasm of the cervix, staging. EXAM: CT CHEST, ABDOMEN, AND PELVIS WITH CONTRAST TECHNIQUE: Multidetector CT imaging of the chest, abdomen and pelvis was performed following the standard protocol during bolus administration of intravenous contrast. CONTRAST:  163m OMNIPAQUE IOHEXOL 300 MG/ML  SOLN COMPARISON:  Chest CT dated 02/02/2015. FINDINGS: CT CHEST FINDINGS Cardiovascular: Aortic atherosclerosis. No thoracic aortic aneurysm. Heart size is normal. No pericardial effusion. Three-vessel coronary artery calcifications. Mediastinum/Nodes: No mass or enlarged lymph nodes seen within the mediastinum or perihilar regions. Esophagus is unremarkable. Trachea and central bronchi are unremarkable. Lungs/Pleura: Chronic pleuroparenchymal scarring/fibrosis at the lung apices. Irregular pulmonary nodule within the RIGHT upper lobe measures 7 mm greatest dimension (series 4, images 56 and 57). Emphysematous changes within the upper lobes bilaterally, at least moderate in degree. Musculoskeletal: Mild degenerative spondylosis of the thoracic spine. No acute or suspicious osseous lesion. CT ABDOMEN PELVIS FINDINGS Hepatobiliary: No mass or lesion is seen within the liver. Gallbladder  appears normal. No bile duct dilatation seen. Pancreas: Unremarkable. No pancreatic ductal dilatation or surrounding inflammatory changes. Spleen: Normal in size without focal abnormality. Adrenals/Urinary Tract: Adrenal glands appear normal. Kidneys are unremarkable without suspicious mass, stone or hydronephrosis. Small RIGHT renal cyst. Bladder is unremarkable, partially decompressed. Stomach/Bowel: No dilated large or small bowel loops. No evidence of bowel wall inflammation. Fairly large amount of stool throughout the nondistended colon. Retrocolic appendix appears normal. Stomach is unremarkable. Vascular/Lymphatic: Aortic atherosclerosis. No enlarged lymph nodes seen. Reproductive: Fluid distending the endometrial canal and or endometrial thickening to the level of the cervix. No adnexal mass or free fluid seen. Haziness about the cervix, of uncertain significance. Other: No free fluid or abscess collection. No free intraperitoneal air. Musculoskeletal: Mild degenerative spondylosis of the lumbar spine. No acute or suspicious osseous finding. IMPRESSION: 1. Fluid distending the endometrial canal and or endometrial thickening to the level of the cervix, corresponding to the given history of cervical cancer. No evidence of pathologic lymphadenopathy or distant metastasis within the abdomen or pelvis. 2. Haziness about the lower cervix which could represent local contiguous spread in the setting of cervical cancer. Would consider pelvic MRI for more definitive characterization of the soft tissues immediately adjacent to the cervix. 3. 7 mm irregular pulmonary nodule within the RIGHT upper lobe, suspicious for possible primary or metastatic malignancy. This  nodule is likely too small to be further characterized with PET-CT, but PET-CT might be helpful. 4. Chronic pleuroparenchymal scarring/fibrosis at the lung apices. 5. Fairly large amount of stool throughout the nondistended colon (constipation? ). No bowel  obstruction or evidence of bowel wall inflammation. Aortic Atherosclerosis (ICD10-I70.0) and Emphysema (ICD10-J43.9). Electronically Signed   By: Franki Cabot M.D.   On: 12/31/2018 14:22   NM PET Image Initial (PI) Skull Base To Thigh  Result Date: 01/08/2019 CLINICAL DATA:  Initial treatment strategy for cervical cancer. EXAM: NUCLEAR MEDICINE PET SKULL BASE TO THIGH TECHNIQUE: 7.9 mCi F-18 FDG was injected intravenously. Full-ring PET imaging was performed from the skull base to thigh after the radiotracer. CT data was obtained and used for attenuation correction and anatomic localization. Fasting blood glucose: 80 mg/dl COMPARISON:  Chest abdomen pelvis CT 12/31/2018. FINDINGS: Mediastinal blood pool activity: SUV max 2.2 Liver activity: SUV max NA NECK: No hypermetabolic lymph nodes in the neck. Incidental CT findings: none CHEST: No hypermetabolic mediastinal or hilar nodes. The 7 mm nodule in the right upper lobe (image 83/3) shows discernible FDG accumulation with SUV max = 1.5. Incidental CT findings: Centrilobular emphsyema noted. Coronary artery calcification is evident. Atherosclerotic calcification is noted in the wall of the thoracic aorta. ABDOMEN/PELVIS: No abnormal hypermetabolic activity within the liver, pancreas, adrenal glands, or spleen. No hypermetabolic lymph nodes in the abdomen. 7 mm right pelvic sidewall lymph node visible on image 219/series 3 demonstrates SUV max = 3.0. Similar irregular 7 mm short axis left pelvic sidewall lymph node on 217/3 demonstrates SUV max = 3.4. There is marked hypermetabolism in the lower uterine segment/cervical region. Incidental CT findings: There is abdominal aortic atherosclerosis without aneurysm. Left colonic diverticulosis without diverticulitis. SKELETON: No focal hypermetabolic activity to suggest skeletal metastasis. Incidental CT findings: none IMPRESSION: 1. Marked hypermetabolism in the region of the cervix, compatible with the reported  history of cervical cancer. 2. Small bilateral pelvic sidewall lymph nodes show discernible FDG accumulation, concerning for metastatic disease although neither lymph node is enlarged by CT size criteria. 3. 7 mm nodule in the right upper lobe shows discernible FDG accumulation. Given the discernible uptake in a tiny lung nodule of this size, neoplasm is a distinct consideration. Primary bronchogenic carcinoma or metastatic disease could have this appearance. 4.  Aortic Atherosclerois (ICD10-170.0) 5.  Emphysema. ([id number]-Z99.9) Electronically Signed   By: Misty Stanley M.D.   On: 01/08/2019 14:08      ASSESSMENT & PLAN:  1. Malignant neoplasm of cervix, unspecified site ([place])   2. Encounter for antineoplastic chemotherapy   3. Hyponatremia   4. Lung nodules    #Locally advanced cervical cancer stage IIIC1 with pelvic nodes involvement. Currently on concurrent chemoradiation. She is doing well clinically.  Labs are reviewed and discussed with patient. Counts are acceptable to proceed with weekly Carboplatin.   # Hyponatremia, chronic,  serum osmolarity 284,  Urinary sodium 66 urine osmolarity 249 Sodium level is better after her water restriction.  TSH is decreased follow up with PCP for management.   #Lung nodule, Possible SBRT to lung nodule for presumed primary lung cancer.   All questions were answered. The patient knows to call the clinic with any problems questions or concerns.   Return of visit:  1 week.    [name], MD, PhD Hematology Oncology [facility] Pager- [phone] 03/13/2019

## 2019-03-14 ENCOUNTER — Ambulatory Visit
Admission: RE | Admit: 2019-03-14 | Discharge: 2019-03-14 | Disposition: A | Discharge status: Home / Self Care | Payer: Medicare Other | Source: Ambulatory Visit | Attending: Radiation Oncology | Admitting: Radiation Oncology

## 2019-03-14 ENCOUNTER — Other Ambulatory Visit: Payer: Self-pay

## 2019-03-14 DIAGNOSIS — Z510 Encounter for antineoplastic radiation therapy: Secondary | ICD-10-CM | POA: Diagnosis not present

## 2019-03-14 DIAGNOSIS — C539 Malignant neoplasm of cervix uteri, unspecified: Secondary | ICD-10-CM | POA: Diagnosis not present

## 2019-03-17 ENCOUNTER — Ambulatory Visit
Admission: RE | Admit: 2019-03-17 | Discharge: 2019-03-17 | Disposition: A | Discharge status: Home / Self Care | Payer: Medicare Other | Source: Ambulatory Visit | Attending: Radiation Oncology | Admitting: Radiation Oncology

## 2019-03-17 ENCOUNTER — Other Ambulatory Visit: Payer: Self-pay

## 2019-03-17 DIAGNOSIS — C539 Malignant neoplasm of cervix uteri, unspecified: Secondary | ICD-10-CM | POA: Diagnosis not present

## 2019-03-17 DIAGNOSIS — Z510 Encounter for antineoplastic radiation therapy: Secondary | ICD-10-CM | POA: Diagnosis not present

## 2019-03-17 NOTE — Chronic Care Management (AMB) (Signed)
Chronic Care Management   Note  03/17/2019 Name: [name] MRN: [id number] DOB: 04-08-1945  Mary Hoover is a 74 y.o. year old female who is a primary care patient of [name], DO. I reached out to [name] by phone today in response to a referral sent by Ms. Cherylin [employer]'s health plan.     Ms. Lepp was given information about Chronic Care Management services today including:  1. CCM service includes personalized support from designated clinical staff supervised by her physician, including individualized plan of care and coordination with other care providers 2. 24/7 contact phone numbers for assistance for urgent and routine care needs. 3. Service will only be billed when office clinical staff spend 20 minutes or more in a month to coordinate care. 4. Only one practitioner may furnish and bill the service in a calendar month. 5. The patient may stop CCM services at any time (effective at the end of the month) by phone call to the office staff. 6. The patient will be responsible for cost sharing (co-pay) of up to 20% of the service fee (after annual deductible is met).  Patient did not agree to enrollment in care management services and does not wish to consider at this time.  Follow up plan: The care management team is available to follow up with the patient after provider conversation with the patient regarding recommendation for care management engagement and subsequent re-referral to the care management team.   Noreene Larsson, [place], [place], [place] [address] Direct Dial: [phone] [email or web address]: [email or web address]

## 2019-03-18 ENCOUNTER — Ambulatory Visit
Admission: RE | Admit: 2019-03-18 | Discharge: 2019-03-18 | Disposition: A | Discharge status: Home / Self Care | Payer: Medicare Other | Source: Ambulatory Visit | Attending: Radiation Oncology | Admitting: Radiation Oncology

## 2019-03-18 ENCOUNTER — Other Ambulatory Visit: Payer: Self-pay

## 2019-03-18 DIAGNOSIS — C539 Malignant neoplasm of cervix uteri, unspecified: Secondary | ICD-10-CM | POA: Diagnosis not present

## 2019-03-18 DIAGNOSIS — Z510 Encounter for antineoplastic radiation therapy: Secondary | ICD-10-CM | POA: Diagnosis not present

## 2019-03-19 ENCOUNTER — Other Ambulatory Visit: Payer: Self-pay

## 2019-03-19 ENCOUNTER — Ambulatory Visit
Admission: RE | Admit: 2019-03-19 | Discharge: 2019-03-19 | Disposition: A | Discharge status: Home / Self Care | Payer: Medicare Other | Source: Ambulatory Visit | Attending: Radiation Oncology | Admitting: Radiation Oncology

## 2019-03-19 DIAGNOSIS — C539 Malignant neoplasm of cervix uteri, unspecified: Secondary | ICD-10-CM | POA: Diagnosis not present

## 2019-03-19 DIAGNOSIS — Z510 Encounter for antineoplastic radiation therapy: Secondary | ICD-10-CM | POA: Diagnosis not present

## 2019-03-20 ENCOUNTER — Inpatient Hospital Stay: Payer: Medicare Other

## 2019-03-20 ENCOUNTER — Inpatient Hospital Stay (HOSPITAL_BASED_OUTPATIENT_CLINIC_OR_DEPARTMENT_OTHER): Payer: Medicare Other | Admitting: Oncology

## 2019-03-20 ENCOUNTER — Inpatient Hospital Stay: Payer: Medicare Other | Admitting: Oncology

## 2019-03-20 ENCOUNTER — Telehealth: Payer: Self-pay

## 2019-03-20 ENCOUNTER — Other Ambulatory Visit: Payer: Self-pay

## 2019-03-20 ENCOUNTER — Encounter: Payer: Self-pay | Admitting: Oncology

## 2019-03-20 ENCOUNTER — Ambulatory Visit
Admission: RE | Admit: 2019-03-20 | Discharge: 2019-03-20 | Disposition: A | Discharge status: Home / Self Care | Payer: Medicare Other | Source: Ambulatory Visit | Attending: Radiation Oncology | Admitting: Radiation Oncology

## 2019-03-20 VITALS — BP 129/59 | HR 72 | Temp 97.9°F | Resp 18 | Wt 132.1 lb

## 2019-03-20 DIAGNOSIS — E871 Hypo-osmolality and hyponatremia: Secondary | ICD-10-CM | POA: Diagnosis not present

## 2019-03-20 DIAGNOSIS — Z510 Encounter for antineoplastic radiation therapy: Secondary | ICD-10-CM | POA: Diagnosis not present

## 2019-03-20 DIAGNOSIS — C539 Malignant neoplasm of cervix uteri, unspecified: Secondary | ICD-10-CM

## 2019-03-20 DIAGNOSIS — R911 Solitary pulmonary nodule: Secondary | ICD-10-CM | POA: Diagnosis not present

## 2019-03-20 DIAGNOSIS — Z5111 Encounter for antineoplastic chemotherapy: Secondary | ICD-10-CM

## 2019-03-20 DIAGNOSIS — I700 Atherosclerosis of aorta: Secondary | ICD-10-CM | POA: Diagnosis not present

## 2019-03-20 DIAGNOSIS — J449 Chronic obstructive pulmonary disease, unspecified: Secondary | ICD-10-CM | POA: Diagnosis not present

## 2019-03-20 DIAGNOSIS — E785 Hyperlipidemia, unspecified: Secondary | ICD-10-CM | POA: Diagnosis not present

## 2019-03-20 DIAGNOSIS — Z79899 Other long term (current) drug therapy: Secondary | ICD-10-CM | POA: Diagnosis not present

## 2019-03-20 DIAGNOSIS — K5730 Diverticulosis of large intestine without perforation or abscess without bleeding: Secondary | ICD-10-CM | POA: Diagnosis not present

## 2019-03-20 DIAGNOSIS — R918 Other nonspecific abnormal finding of lung field: Secondary | ICD-10-CM | POA: Diagnosis not present

## 2019-03-20 DIAGNOSIS — E039 Hypothyroidism, unspecified: Secondary | ICD-10-CM | POA: Diagnosis not present

## 2019-03-20 DIAGNOSIS — Z7982 Long term (current) use of aspirin: Secondary | ICD-10-CM | POA: Diagnosis not present

## 2019-03-20 DIAGNOSIS — I10 Essential (primary) hypertension: Secondary | ICD-10-CM | POA: Diagnosis not present

## 2019-03-20 LAB — CBC WITH DIFFERENTIAL/PLATELET
Abs Immature Granulocytes: 0.03 10*3/uL (ref 0.00–0.07)
Basophils Absolute: 0 10*3/uL (ref 0.0–0.1)
Basophils Relative: 1 %
Eosinophils Absolute: 0.1 10*3/uL (ref 0.0–0.5)
Eosinophils Relative: 2 %
HCT: 31.4 % — ABNORMAL LOW (ref 36.0–46.0)
Hemoglobin: 10.6 g/dL — ABNORMAL LOW (ref 12.0–15.0)
Immature Granulocytes: 1 %
Lymphocytes Relative: 7 %
Lymphs Abs: 0.3 10*3/uL — ABNORMAL LOW (ref 0.7–4.0)
MCH: 32.4 pg (ref 26.0–34.0)
MCHC: 33.8 g/dL (ref 30.0–36.0)
MCV: 96 fL (ref 80.0–100.0)
Monocytes Absolute: 0.4 10*3/uL (ref 0.1–1.0)
Monocytes Relative: 10 %
Neutro Abs: 3.5 10*3/uL (ref 1.7–7.7)
Neutrophils Relative %: 79 %
Platelets: 201 10*3/uL (ref 150–400)
RBC: 3.27 MIL/uL — ABNORMAL LOW (ref 3.87–5.11)
RDW: 13.4 % (ref 11.5–15.5)
WBC: 4.3 10*3/uL (ref 4.0–10.5)
nRBC: 0 % (ref 0.0–0.2)

## 2019-03-20 LAB — COMPREHENSIVE METABOLIC PANEL
ALT: 14 U/L (ref 0–44)
AST: 15 U/L (ref 15–41)
Albumin: 3.9 g/dL (ref 3.5–5.0)
Alkaline Phosphatase: 58 U/L (ref 38–126)
Anion gap: 9 (ref 5–15)
BUN: 16 mg/dL (ref 8–23)
CO2: 22 mmol/L (ref 22–32)
Calcium: 9 mg/dL (ref 8.9–10.3)
Chloride: 104 mmol/L (ref 98–111)
Creatinine, Ser: 0.89 mg/dL (ref 0.44–1.00)
GFR calc Af Amer: >60 mL/min (ref >60)
GFR calc non Af Amer: >60 mL/min (ref >60)
Glucose, Bld: 102 mg/dL — ABNORMAL HIGH (ref 70–99)
Potassium: 4.2 mmol/L (ref 3.5–5.1)
Sodium: 135 mmol/L (ref 135–145)
Total Bilirubin: 0.5 mg/dL (ref 0.3–1.2)
Total Protein: 7.1 g/dL (ref 6.5–8.1)

## 2019-03-20 MED ORDER — SODIUM CHLORIDE 0.9 % IV SOLN
152.8000 mg | Freq: Once | INTRAVENOUS | Status: AC
  Administered 2019-03-20: 150 mg via INTRAVENOUS
  Filled 2019-03-20: qty 15

## 2019-03-20 MED ORDER — SODIUM CHLORIDE 0.9 % IV SOLN
10.0000 mg | Freq: Once | INTRAVENOUS | Status: AC
  Administered 2019-03-20: 10 mg via INTRAVENOUS
  Filled 2019-03-20: qty 1

## 2019-03-20 MED ORDER — PALONOSETRON HCL INJECTION 0.25 MG/5ML
0.2500 mg | Freq: Once | INTRAVENOUS | Status: AC
  Administered 2019-03-20: 0.25 mg via INTRAVENOUS
  Filled 2019-03-20: qty 5

## 2019-03-20 MED ORDER — SODIUM CHLORIDE 0.9 % IV SOLN
Freq: Once | INTRAVENOUS | Status: AC
  Filled 2019-03-20: qty 250

## 2019-03-20 NOTE — Progress Notes (Signed)
Patient here for follow up. Ever since she started treatment she had diarrhea more often. Patient reports feeling more tired.

## 2019-03-20 NOTE — Progress Notes (Signed)
Hematology/Oncology note [facility] Telephone:([phone] [phone]   Patient Care Team: [name], DO as PCP - General (Family Medicine) Clent Jacks, RN as Oncology Nurse Navigator  REFERRING PROVIDER: [name], DO  CHIEF COMPLAINTS/REASON FOR VISIT:  Follow up cervical cancer  HISTORY OF PRESENTING ILLNESS:  Mary Hoover is a  74 y.o.  female with PMH listed below was seen in consultation at the request of  [name], DO  for evaluation of cervical cancer Patient has developed postmenopausal vaginal bleeding and discharge.  She was noted to have a friable cervix/2 cm mass of the cervix, concerning for malignancy 12/19/2018 endometrial biopsy showed scattered atypical squamous cells suspicious for malignancy.  Predominantly necrosis with associated inflammation. 01/01/2019 initial cervix biopsy showed at least high-grade squamous intraepithelial lesion.-HPV negative 01/22/2019, repeat vaginal wall biopsy and cervix 9:00 biopsy showed squamous cell carcinoma.    Staging images 12/31/2018 showed fluid distending the endometrial canal and or endometrial thickening to the level of cervix.  No evidence of pathological lymphadenopathy.  Haziness about the lower cervix.  7 mm irregular pulmonary nodule within the right upper lobe, suspicious for possible primary or metastatic malignancy.  Chronic pleural-parenchymal scarring/fibrosis at the lung apices.  Large amount of stool.  No bowel obstruction. 01/08/2019 PET scan showed marked hypermetabolism in the region of the cervix, compatible with the reported history of cervical cancer. Small bilateral pelvic sidewall lymph nodes discernible FDG accumulation, concerning for metastatic disease although neither lymph node is enlarged by CT size criteria. 7 mm nodule in the right upper lobe shows discernible FDG accumulation.  Questionable neoplasm, primary versus metastatic. Emphysema.    #Chronic hearing loss # Lung nodule, FDG avid, questionable primary bronchogenic carcinoma versus metastatic cancer. Discussed with radiation oncology.  Her case was also discussed on tumor board.  Consensus reached on finishing concurrent chemoradiation treatments for cervix cancer first. Possible SBRT to lung nodule for presumed primary lung cancer.  INTERVAL HISTORY Mary Hoover is a 74 y.o. female who has above history reviewed by me today presents for follow up visit for management of low  advanced cervix cancer Problems and complaints are listed below: Patient denies any new complaints. She tolerates concurrent chemotherapy and radiation.  She has no new complaints.  Denies fever, chills, nausea, vomiting, diarrhea, chest pain, shortness of breath, abdominal pain, urinary symptoms, lower extremity swelling.     Review of Systems  Constitutional: Negative for appetite change, chills, fatigue and fever.  HENT:   Positive for hearing loss. Negative for voice change.   Eyes: Negative for eye problems.  Respiratory: Negative for chest tightness and cough.   Cardiovascular: Negative for chest pain.  Gastrointestinal: Negative for abdominal distention, abdominal pain and blood in stool.  Endocrine: Negative for hot flashes.  Genitourinary: Negative for difficulty urinating and frequency.   Musculoskeletal: Negative for arthralgias.  Skin: Negative for itching and rash.  Neurological: Negative for extremity weakness.  Hematological: Negative for adenopathy.  Psychiatric/Behavioral: Negative for confusion.    MEDICAL HISTORY:  Past Medical History:  Diagnosis Date  . Carotid atherosclerosis   . Carotid bruit   . COPD (chronic obstructive pulmonary disease) (HCC)    emphysema  . Hyperlipidemia   . Hypertension   . Hyponatremia 02/27/2019  . Hypothyroidism   . Infected cat bite 1980s   was hospitalized  . Tobacco use     SURGICAL HISTORY: Past Surgical History:   Procedure Laterality Date  . CAROTID  ENDARTERECTOMY Right Oct. 2015   Dr. Lucky Cowboy  . CAROTID STENOSIS Left April 2016   carotid stenosis surgery  . COLONOSCOPY WITH PROPOFOL N/A 11/08/2016   Procedure: COLONOSCOPY WITH PROPOFOL;  Surgeon: Jonathon Bellows, MD;  Location: [facility] ENDOSCOPY;  Service: Gastroenterology;  Laterality: N/A;  . ESOPHAGOGASTRODUODENOSCOPY (EGD) WITH PROPOFOL N/A 11/08/2016   Procedure: ESOPHAGOGASTRODUODENOSCOPY (EGD) WITH PROPOFOL;  Surgeon: Jonathon Bellows, MD;  Location: [facility] ENDOSCOPY;  Service: Gastroenterology;  Laterality: N/A;  . FLEXIBLE BRONCHOSCOPY N/A 12/17/2014   Procedure: FLEXIBLE BRONCHOSCOPY;  Surgeon: Wilhelmina Mcardle, MD;  Location: [facility] ORS;  Service: Pulmonary;  Laterality: N/A;  . INCISION AND DRAINAGE / EXCISION THYROGLOSSAL CYST  April 2011    SOCIAL HISTORY: Social History   Socioeconomic History  . Marital status: Married    Spouse name: Not on file  . Number of children: Not on file  . Years of education: Not on file  . Highest education level: 9th grade  Occupational History  . Occupation: retired   Tobacco Use  . Smoking status: Current Every Day Smoker    Packs/day: 1.50    Years: 50.00    Pack years: 75.00    Types: Cigarettes  . Smokeless tobacco: Never Used  Substance and Sexual Activity  . Alcohol use: No    Alcohol/week: 0.0 standard drinks  . Drug use: No  . Sexual activity: Yes  Other Topics Concern  . Not on file  Social History Narrative   Lives at home with husband   Social Determinants of Health   Financial Resource Strain:   . Difficulty of Paying Living Expenses: Not on file  Food Insecurity:   . Worried About Charity fundraiser in the Last Year: Not on file  . Ran Out of Food in the Last Year: Not on file  Transportation Needs:   . Lack of Transportation (Medical): Not on file  . Lack of Transportation (Non-Medical): Not on file  Physical Activity:   . Days of Exercise per Week: Not on file  . Minutes of  Exercise per Session: Not on file  Stress:   . Feeling of Stress : Not on file  Social Connections:   . Frequency of Communication with Friends and Family: Not on file  . Frequency of Social Gatherings with Friends and Family: Not on file  . Attends Religious Services: Not on file  . Active Member of Clubs or Organizations: Not on file  . Attends Archivist Meetings: Not on file  . Marital Status: Not on file  Intimate Partner Violence:   . Fear of Current or Ex-Partner: Not on file  . Emotionally Abused: Not on file  . Physically Abused: Not on file  . Sexually Abused: Not on file    FAMILY HISTORY: Family History  Problem Relation Age of Onset  . Congestive Heart Failure Mother   . Heart disease Mother   . Hypertension Mother   . Hypertension Sister   . Diabetes Sister   . Heart disease Sister   . Cancer Brother        liver, lung  . Heart disease Maternal Grandmother   . Heart disease Maternal Uncle   . Stroke Maternal Grandfather   . Heart disease Brother   . Hypertension Brother   . Heart attack Brother   . COPD Neg Hx     ALLERGIES:  is allergic to atorvastatin.  MEDICATIONS:  Current Outpatient Medications  Medication Sig Dispense Refill  . ASPIRIN 81 PO Take  by mouth daily. 81 mg tablet    . budesonide-formoterol (SYMBICORT) 160-4.5 MCG/ACT inhaler Inhale 2 puffs into the lungs 2 (two) times daily. 1 Inhaler 12  . clopidogrel (PLAVIX) 75 MG tablet Take 1 tablet (75 mg total) by mouth daily. 90 tablet 3  . diphenhydrAMINE (BENADRYL) 25 mg capsule Take 25 mg by mouth at bedtime as needed.    . ferrous sulfate 325 (65 FE) MG EC tablet Take 1 tablet (325 mg total) by mouth daily with breakfast. 90 tablet 4  . levothyroxine (SYNTHROID) 175 MCG tablet Take 1 tablet (175 mcg total) by mouth daily. 30 tablet 1  . lidocaine-prilocaine (EMLA) cream Apply to affected area once 30 g 3  . lisinopril (ZESTRIL) 20 MG tablet Take 1 tablet (20 mg total) by mouth  daily. 90 tablet 1  . Multiple Vitamin (MULTIVITAMIN) tablet Take 1 tablet by mouth daily.    . prochlorperazine (COMPAZINE) 10 MG tablet Take 1 tablet (10 mg total) by mouth every 6 (six) hours as needed (Nausea or vomiting). 30 tablet 1  . vitamin B-12 (CYANOCOBALAMIN) 500 MCG tablet Take 500 mcg by mouth daily.     No current facility-administered medications for this visit.   Facility-Administered Medications Ordered in Other Visits  Medication Dose Route Frequency Provider Last Rate Last Admin  . CARBOplatin (PARAPLATIN) 150 mg in sodium chloride 0.9 % 100 mL chemo infusion  150 mg Intravenous Once [name], MD      . dexamethasone (DECADRON) 10 mg in sodium chloride 0.9 % 50 mL IVPB  10 mg Intravenous Once [name], MD         PHYSICAL EXAMINATION: ECOG PERFORMANCE STATUS: 1 - Symptomatic but completely ambulatory Vitals:   03/20/19 1040  BP: (!) 129/59  Pulse: 72  Resp: 18  Temp: 97.9 F (36.6 C)   Filed Weights   03/20/19 1040  Weight: 132 lb 1.6 oz (59.9 kg)    Physical Exam Constitutional:      General: She is not in acute distress. HENT:     Head: Normocephalic and atraumatic.  Eyes:     General: No scleral icterus.    Pupils: Pupils are equal, round, and reactive to light.  Cardiovascular:     Rate and Rhythm: Normal rate and regular rhythm.     Heart sounds: Normal heart sounds.  Pulmonary:     Effort: Pulmonary effort is normal. No respiratory distress.     Breath sounds: No wheezing.     Comments: Decreased breath sound bilaterally  Abdominal:     General: Bowel sounds are normal. There is no distension.     Palpations: Abdomen is soft. There is no mass.     Tenderness: There is no abdominal tenderness.  Musculoskeletal:        General: No deformity. Normal range of motion.     Cervical back: Normal range of motion and neck supple.  Skin:    General: Skin is warm and dry.     Findings: No erythema or rash.  Neurological:     Mental Status: She is  alert and oriented to person, place, and time. Mental status is at baseline.     Cranial Nerves: No cranial nerve deficit.     Coordination: Coordination normal.  Psychiatric:        Mood and Affect: Mood normal.     LABORATORY DATA:  I have reviewed the data as listed Lab Results  Component Value Date   WBC 4.3 03/20/2019  HGB 10.6 (L) 03/20/2019   HCT 31.4 (L) 03/20/2019   MCV 96.0 03/20/2019   PLT 201 03/20/2019   Recent Labs    02/20/19 0817 02/27/19 0810 03/06/19 0826 03/13/19 0837 03/20/19 1025  NA 130*   < > 129* 132* 135  K 4.5   < > 4.6 5.1 4.2  CL 97*   < > 97* 101 104  CO2 23   < > _0 GLUCOSE 103*   < > 100* 95 102*  BUN 13   < > _1 CREATININE 0.81   < > 0.97 0.78 0.89  CALCIUM 9.2   < > 9.1 8.9 9.0  GFRNONAA >60   < > 59* >60 >60  GFRAA >60   < > >60 >60 >60  PROT 7.2   < > 7.0 6.9 7.1  ALBUMIN 4.0   < > 3.9 3.8 3.9  AST 16   < > _2 ALT 11   < > _3 ALKPHOS 66   < > 56 55 58  BILITOT 0.5   < > 0.7 0.6 0.5  BILIDIR <0.1  --   --   --   --   IBILI NOT CALCULATED  --   --   --   --    < > = values in this interval not displayed.   Iron/TIBC/Ferritin/ %Sat    Component Value Date/Time   IRON 55 10/31/2016 1546   TIBC 339 10/31/2016 1546   FERRITIN 18 10/31/2016 1546   IRONPCTSAT 16 10/31/2016 1546      RADIOGRAPHIC STUDIES: I have personally reviewed the radiological images as listed and agreed with the findings in the report. CT CHEST W CONTRAST  Result Date: 12/31/2018 CLINICAL DATA:  Neoplasm of the cervix, staging. EXAM: CT CHEST, ABDOMEN, AND PELVIS WITH CONTRAST TECHNIQUE: Multidetector CT imaging of the chest, abdomen and pelvis was performed following the standard protocol during bolus administration of intravenous contrast. CONTRAST:  194m OMNIPAQUE IOHEXOL 300 MG/ML  SOLN COMPARISON:  Chest CT dated 02/02/2015. FINDINGS: CT CHEST FINDINGS Cardiovascular: Aortic atherosclerosis. No thoracic aortic aneurysm. Heart  size is normal. No pericardial effusion. Three-vessel coronary artery calcifications. Mediastinum/Nodes: No mass or enlarged lymph nodes seen within the mediastinum or perihilar regions. Esophagus is unremarkable. Trachea and central bronchi are unremarkable. Lungs/Pleura: Chronic pleuroparenchymal scarring/fibrosis at the lung apices. Irregular pulmonary nodule within the RIGHT upper lobe measures 7 mm greatest dimension (series 4, images 56 and 57). Emphysematous changes within the upper lobes bilaterally, at least moderate in degree. Musculoskeletal: Mild degenerative spondylosis of the thoracic spine. No acute or suspicious osseous lesion. CT ABDOMEN PELVIS FINDINGS Hepatobiliary: No mass or lesion is seen within the liver. Gallbladder appears normal. No bile duct dilatation seen. Pancreas: Unremarkable. No pancreatic ductal dilatation or surrounding inflammatory changes. Spleen: Normal in size without focal abnormality. Adrenals/Urinary Tract: Adrenal glands appear normal. Kidneys are unremarkable without suspicious mass, stone or hydronephrosis. Small RIGHT renal cyst. Bladder is unremarkable, partially decompressed. Stomach/Bowel: No dilated large or small bowel loops. No evidence of bowel wall inflammation. Fairly large amount of stool throughout the nondistended colon. Retrocolic appendix appears normal. Stomach is unremarkable. Vascular/Lymphatic: Aortic atherosclerosis. No enlarged lymph nodes seen. Reproductive: Fluid distending the endometrial canal and or endometrial thickening to the level of the cervix. No adnexal mass or free fluid seen. Haziness about the cervix, of uncertain significance. Other: No free fluid or abscess collection. No free intraperitoneal air. Musculoskeletal:  Mild degenerative spondylosis of the lumbar spine. No acute or suspicious osseous finding. IMPRESSION: 1. Fluid distending the endometrial canal and or endometrial thickening to the level of the cervix, corresponding to the  given history of cervical cancer. No evidence of pathologic lymphadenopathy or distant metastasis within the abdomen or pelvis. 2. Haziness about the lower cervix which could represent local contiguous spread in the setting of cervical cancer. Would consider pelvic MRI for more definitive characterization of the soft tissues immediately adjacent to the cervix. 3. 7 mm irregular pulmonary nodule within the RIGHT upper lobe, suspicious for possible primary or metastatic malignancy. This nodule is likely too small to be further characterized with PET-CT, but PET-CT might be helpful. 4. Chronic pleuroparenchymal scarring/fibrosis at the lung apices. 5. Fairly large amount of stool throughout the nondistended colon (constipation? ). No bowel obstruction or evidence of bowel wall inflammation. Aortic Atherosclerosis (ICD10-I70.0) and Emphysema (ICD10-J43.9). Electronically Signed   By: Franki Cabot M.D.   On: 12/31/2018 14:22   CT ABDOMEN PELVIS W CONTRAST  Result Date: 12/31/2018 CLINICAL DATA:  Neoplasm of the cervix, staging. EXAM: CT CHEST, ABDOMEN, AND PELVIS WITH CONTRAST TECHNIQUE: Multidetector CT imaging of the chest, abdomen and pelvis was performed following the standard protocol during bolus administration of intravenous contrast. CONTRAST:  129m OMNIPAQUE IOHEXOL 300 MG/ML  SOLN COMPARISON:  Chest CT dated 02/02/2015. FINDINGS: CT CHEST FINDINGS Cardiovascular: Aortic atherosclerosis. No thoracic aortic aneurysm. Heart size is normal. No pericardial effusion. Three-vessel coronary artery calcifications. Mediastinum/Nodes: No mass or enlarged lymph nodes seen within the mediastinum or perihilar regions. Esophagus is unremarkable. Trachea and central bronchi are unremarkable. Lungs/Pleura: Chronic pleuroparenchymal scarring/fibrosis at the lung apices. Irregular pulmonary nodule within the RIGHT upper lobe measures 7 mm greatest dimension (series 4, images 56 and 57). Emphysematous changes within the  upper lobes bilaterally, at least moderate in degree. Musculoskeletal: Mild degenerative spondylosis of the thoracic spine. No acute or suspicious osseous lesion. CT ABDOMEN PELVIS FINDINGS Hepatobiliary: No mass or lesion is seen within the liver. Gallbladder appears normal. No bile duct dilatation seen. Pancreas: Unremarkable. No pancreatic ductal dilatation or surrounding inflammatory changes. Spleen: Normal in size without focal abnormality. Adrenals/Urinary Tract: Adrenal glands appear normal. Kidneys are unremarkable without suspicious mass, stone or hydronephrosis. Small RIGHT renal cyst. Bladder is unremarkable, partially decompressed. Stomach/Bowel: No dilated large or small bowel loops. No evidence of bowel wall inflammation. Fairly large amount of stool throughout the nondistended colon. Retrocolic appendix appears normal. Stomach is unremarkable. Vascular/Lymphatic: Aortic atherosclerosis. No enlarged lymph nodes seen. Reproductive: Fluid distending the endometrial canal and or endometrial thickening to the level of the cervix. No adnexal mass or free fluid seen. Haziness about the cervix, of uncertain significance. Other: No free fluid or abscess collection. No free intraperitoneal air. Musculoskeletal: Mild degenerative spondylosis of the lumbar spine. No acute or suspicious osseous finding. IMPRESSION: 1. Fluid distending the endometrial canal and or endometrial thickening to the level of the cervix, corresponding to the given history of cervical cancer. No evidence of pathologic lymphadenopathy or distant metastasis within the abdomen or pelvis. 2. Haziness about the lower cervix which could represent local contiguous spread in the setting of cervical cancer. Would consider pelvic MRI for more definitive characterization of the soft tissues immediately adjacent to the cervix. 3. 7 mm irregular pulmonary nodule within the RIGHT upper lobe, suspicious for possible primary or metastatic malignancy. This  nodule is likely too small to be further characterized with PET-CT, but PET-CT might be helpful. 4.  Chronic pleuroparenchymal scarring/fibrosis at the lung apices. 5. Fairly large amount of stool throughout the nondistended colon (constipation? ). No bowel obstruction or evidence of bowel wall inflammation. Aortic Atherosclerosis (ICD10-I70.0) and Emphysema (ICD10-J43.9). Electronically Signed   By: Franki Cabot M.D.   On: 12/31/2018 14:22   NM PET Image Initial (PI) Skull Base To Thigh  Result Date: 01/08/2019 CLINICAL DATA:  Initial treatment strategy for cervical cancer. EXAM: NUCLEAR MEDICINE PET SKULL BASE TO THIGH TECHNIQUE: 7.9 mCi F-18 FDG was injected intravenously. Full-ring PET imaging was performed from the skull base to thigh after the radiotracer. CT data was obtained and used for attenuation correction and anatomic localization. Fasting blood glucose: 80 mg/dl COMPARISON:  Chest abdomen pelvis CT 12/31/2018. FINDINGS: Mediastinal blood pool activity: SUV max 2.2 Liver activity: SUV max NA NECK: No hypermetabolic lymph nodes in the neck. Incidental CT findings: none CHEST: No hypermetabolic mediastinal or hilar nodes. The 7 mm nodule in the right upper lobe (image 83/3) shows discernible FDG accumulation with SUV max = 1.5. Incidental CT findings: Centrilobular emphsyema noted. Coronary artery calcification is evident. Atherosclerotic calcification is noted in the wall of the thoracic aorta. ABDOMEN/PELVIS: No abnormal hypermetabolic activity within the liver, pancreas, adrenal glands, or spleen. No hypermetabolic lymph nodes in the abdomen. 7 mm right pelvic sidewall lymph node visible on image 219/series 3 demonstrates SUV max = 3.0. Similar irregular 7 mm short axis left pelvic sidewall lymph node on 217/3 demonstrates SUV max = 3.4. There is marked hypermetabolism in the lower uterine segment/cervical region. Incidental CT findings: There is abdominal aortic atherosclerosis without aneurysm.  Left colonic diverticulosis without diverticulitis. SKELETON: No focal hypermetabolic activity to suggest skeletal metastasis. Incidental CT findings: none IMPRESSION: 1. Marked hypermetabolism in the region of the cervix, compatible with the reported history of cervical cancer. 2. Small bilateral pelvic sidewall lymph nodes show discernible FDG accumulation, concerning for metastatic disease although neither lymph node is enlarged by CT size criteria. 3. 7 mm nodule in the right upper lobe shows discernible FDG accumulation. Given the discernible uptake in a tiny lung nodule of this size, neoplasm is a distinct consideration. Primary bronchogenic carcinoma or metastatic disease could have this appearance. 4.  Aortic Atherosclerois (ICD10-170.0) 5.  Emphysema. (XHB71-I96.9) Electronically Signed   By: Misty Stanley M.D.   On: 01/08/2019 14:08      ASSESSMENT & PLAN:  1. Malignant neoplasm of cervix, unspecified site ([place])   2. Encounter for antineoplastic chemotherapy   3. Hyponatremia   4. Lung nodules    #Locally advanced cervical cancer stage IIIC1 with pelvic nodes involvement. Currently on concurrent chemoradiation. She tolerates treatment well.  Labs are reviewed and discussed with patient. Her counts are acceptable to proceed with weekly Carboplatin. She is finishing RT next Monday and this will be her last chemotherapy.  She then will go to [facility] for brachytherapy.    # Hyponatremia, chronic, improved today. Sodium level is 135.  TSH is decreased follow up with PCP for management.   #Lung nodule, I will repeat CT chest in late March, will be approximately 3 months from her last scan.  Possible SBRT to lung nodule  for presumed primary lung cancer.   All questions were answered. The patient knows to call the clinic with any problems questions or concerns.   Return of visit: to be determined.    [name], MD, PhD Hematology Oncology [facility] Pager- [phone] 03/20/2019

## 2019-03-20 NOTE — Telephone Encounter (Signed)
CT has been scheduled as requested.

## 2019-03-20 NOTE — Telephone Encounter (Signed)
Patient has been notified of appointment.

## 2019-03-20 NOTE — Telephone Encounter (Signed)
please schedule her for CT chest wo contrast around 3/22.  Follow up to be determined.

## 2019-03-21 ENCOUNTER — Other Ambulatory Visit: Payer: Self-pay

## 2019-03-21 ENCOUNTER — Ambulatory Visit
Admission: RE | Admit: 2019-03-21 | Discharge: 2019-03-21 | Disposition: A | Discharge status: Home / Self Care | Payer: Medicare Other | Source: Ambulatory Visit | Attending: Radiation Oncology | Admitting: Radiation Oncology

## 2019-03-21 DIAGNOSIS — Z510 Encounter for antineoplastic radiation therapy: Secondary | ICD-10-CM | POA: Diagnosis not present

## 2019-03-21 DIAGNOSIS — C539 Malignant neoplasm of cervix uteri, unspecified: Secondary | ICD-10-CM | POA: Diagnosis not present

## 2019-03-24 ENCOUNTER — Other Ambulatory Visit: Payer: Self-pay

## 2019-03-24 ENCOUNTER — Ambulatory Visit
Admission: RE | Admit: 2019-03-24 | Discharge: 2019-03-24 | Disposition: A | Discharge status: Home / Self Care | Payer: Medicare Other | Source: Ambulatory Visit | Attending: Radiation Oncology | Admitting: Radiation Oncology

## 2019-03-24 DIAGNOSIS — C539 Malignant neoplasm of cervix uteri, unspecified: Secondary | ICD-10-CM | POA: Diagnosis not present

## 2019-03-24 DIAGNOSIS — Z510 Encounter for antineoplastic radiation therapy: Secondary | ICD-10-CM | POA: Diagnosis not present

## 2019-03-26 DIAGNOSIS — I10 Essential (primary) hypertension: Secondary | ICD-10-CM | POA: Diagnosis not present

## 2019-03-26 DIAGNOSIS — E039 Hypothyroidism, unspecified: Secondary | ICD-10-CM | POA: Diagnosis not present

## 2019-03-26 DIAGNOSIS — I739 Peripheral vascular disease, unspecified: Secondary | ICD-10-CM | POA: Diagnosis not present

## 2019-03-26 DIAGNOSIS — I25118 Atherosclerotic heart disease of native coronary artery with other forms of angina pectoris: Secondary | ICD-10-CM | POA: Diagnosis not present

## 2019-03-28 DIAGNOSIS — I10 Essential (primary) hypertension: Secondary | ICD-10-CM | POA: Diagnosis not present

## 2019-04-07 ENCOUNTER — Telehealth: Payer: Self-pay | Admitting: Family Medicine

## 2019-04-07 NOTE — Telephone Encounter (Signed)
Patient is calling to report that she received a letter from her Medicare advantage plan that they will not cover visits with Dr. Wynetta Emery. Patient was advised that she will be going to Dr. Rutherford Limerick. Patient states that she is sorry . She does not want to leave Dr. Wynetta Emery.

## 2019-04-08 NOTE — Telephone Encounter (Signed)
LVM for pt to call back. Calling to advise the pt that this letter was sent out due to an error but it has been corrected and the pt is able to continue to see Dr Wynetta Emery she will just need to contact the insurance company to send her an updated card.

## 2019-04-10 ENCOUNTER — Ambulatory Visit: Payer: Medicare Other | Admitting: Family Medicine

## 2019-04-21 ENCOUNTER — Other Ambulatory Visit: Payer: Medicare Other

## 2019-04-22 ENCOUNTER — Ambulatory Visit
Admission: RE | Admit: 2019-04-22 | Discharge: 2019-04-22 | Disposition: A | Discharge status: Home / Self Care | Payer: Medicare Other | Source: Ambulatory Visit | Attending: Oncology | Admitting: Oncology

## 2019-04-22 ENCOUNTER — Other Ambulatory Visit: Payer: Self-pay

## 2019-04-22 DIAGNOSIS — R918 Other nonspecific abnormal finding of lung field: Secondary | ICD-10-CM | POA: Diagnosis present

## 2019-04-23 ENCOUNTER — Inpatient Hospital Stay: Payer: Medicare Other | Attending: Obstetrics and Gynecology | Admitting: Obstetrics and Gynecology

## 2019-04-23 VITALS — BP 148/81 | HR 96 | Temp 97.7°F | Resp 18 | Wt 131.0 lb

## 2019-04-23 DIAGNOSIS — R350 Frequency of micturition: Secondary | ICD-10-CM | POA: Diagnosis not present

## 2019-04-23 DIAGNOSIS — R197 Diarrhea, unspecified: Secondary | ICD-10-CM | POA: Insufficient documentation

## 2019-04-23 DIAGNOSIS — F17210 Nicotine dependence, cigarettes, uncomplicated: Secondary | ICD-10-CM | POA: Insufficient documentation

## 2019-04-23 DIAGNOSIS — Z801 Family history of malignant neoplasm of trachea, bronchus and lung: Secondary | ICD-10-CM | POA: Insufficient documentation

## 2019-04-23 DIAGNOSIS — E039 Hypothyroidism, unspecified: Secondary | ICD-10-CM | POA: Insufficient documentation

## 2019-04-23 DIAGNOSIS — J449 Chronic obstructive pulmonary disease, unspecified: Secondary | ICD-10-CM | POA: Insufficient documentation

## 2019-04-23 DIAGNOSIS — Z8249 Family history of ischemic heart disease and other diseases of the circulatory system: Secondary | ICD-10-CM | POA: Diagnosis not present

## 2019-04-23 DIAGNOSIS — Z7951 Long term (current) use of inhaled steroids: Secondary | ICD-10-CM | POA: Diagnosis not present

## 2019-04-23 DIAGNOSIS — Z79899 Other long term (current) drug therapy: Secondary | ICD-10-CM | POA: Insufficient documentation

## 2019-04-23 DIAGNOSIS — Z7982 Long term (current) use of aspirin: Secondary | ICD-10-CM | POA: Diagnosis not present

## 2019-04-23 DIAGNOSIS — C538 Malignant neoplasm of overlapping sites of cervix uteri: Secondary | ICD-10-CM

## 2019-04-23 DIAGNOSIS — E785 Hyperlipidemia, unspecified: Secondary | ICD-10-CM | POA: Diagnosis not present

## 2019-04-23 DIAGNOSIS — I10 Essential (primary) hypertension: Secondary | ICD-10-CM | POA: Insufficient documentation

## 2019-04-23 DIAGNOSIS — Z923 Personal history of irradiation: Secondary | ICD-10-CM | POA: Diagnosis not present

## 2019-04-23 DIAGNOSIS — Z833 Family history of diabetes mellitus: Secondary | ICD-10-CM | POA: Diagnosis not present

## 2019-04-23 NOTE — Progress Notes (Signed)
Gynecologic Oncology Interval Visit   Referring Provider: Dr. Livingston Diones Ob-Gyn  Chief Complaint: Probable Squamous Cell Cervical Cancer, repeat cervical biopsy  Subjective:  Mary Hoover is a 74 y.o. female initially diagnosed with stage IIIc cervical squamous cell carcinoma s/p concurrent carboplatin (02/20/19-03/20/19) & radiation (02/17/2019- 03/24/19) followed by vaginal brachytherapy at [facility] completed 04/11/2019 who returns to clinic for follow-up.   04/22/2019- CT Chest WO Contrast revealed stable right upper lobe lung nodule measuring 11m, previously given the discernible uptake on PET from 01/08/2019, in a tiny lung nodule of this size, neoplasm is a distinct consideration. Primary bronchogenic carcinoma or metastatic disease could have this appearance. Recommend continued serial follow up to ensure stability.   Finished brachytherapy two weeks ago. She has urinary frequency and intermittent diarrhea.  Also reports some yellow vaginal discharge.   She is a smoker and has significant emphysema.   Gynecologic Oncology History  Mary Hoover a pleasant y.o. female who is seen in consultation from Dr. JWynetta Emeryfor new diagnosis of probable cervical cancer. Patient initially presented to her PCP with postmenopausal vaginal bleeding.  [name]  Last menstrual period around age 74  On exam, cervix was friable and firm to palpation extending anteriorly.  Urethral caruncle also noted.  Speculum exam caused some bleeding that required silver nitrate.  Pap and endometrial biopsy were performed.  Endometrial biopsy returned some exudative material.  Findings are concerning for neoplastic process.  Pap- 12/19/2018 - Squamous cell Carcinoma - High Risk HPV - negative  12/19/2018- Endometrial biopsy - Scattered atypical squamous cells suspicious for malignancy  - Predominantly necrosis with associated inflammation   CT Chest/Abdomen/Pelvis on  12/31/2018 for staging: Fluid distending the endometrial canal and/or endometrial thickening to the level of the cervix.  No adnexal mass or free fluid seen.  Haziness about the cervix of uncertain significance.  Chronic pleuroparenchymal scarring/fibrosis at lung apices, irregular pulmonary nodule within the Right upper lobe measuring 766m moderate emphysema.   01/08/2019 Cervical biopsy. DIAGNOSIS:  A. CERVIX; BIOPSY:  - AT LEAST HIGH-GRADE SQUAMOUS INTRAEPITHELIAL LESION (HSIL/CIN3).  Comment: Invasion is not definitively identified in this sample. Numerous step sections are examined. Given the exophytic growth pattern of the lesion, an underlying invasive component cannot be excluded. The diagnosis of squamous cell carcinoma on recent pap smear is noted. Immunohistochemical stain for p16 is negative.    01/08/2019 PET IMPRESSION:1. Marked hypermetabolism in the region of the cervix, compatible with the reported history of cervical cancer. 2. Small bilateral pelvic sidewall lymph nodes show discernible FDG accumulation, concerning for metastatic disease although neither lymph node is enlarged by CT size criteria. 3. 7 mm nodule in the right upper lobe shows discernible FDG accumulation. Given the discernible uptake in a tiny lung nodule of this size, neoplasm is a distinct consideration. Primary bronchogenic carcinoma or metastatic disease could have this appearance. 4.  Aortic Atherosclerois (ICD10-170.0) 5.  Emphysema. (I([id number])  Stage IIIc cervical squamous cell carcinoma s/p concurrent carboplatin (02/20/19-03/20/19) & radiation (02/17/2019- 03/24/19) [facility] who returns to clinic for follow-up.   04/22/2019- CT Chest WO Contrast revealed stable right upper lobe lung nodule measuring 80m50mpreviously given the discernible uptake on PET from 01/08/2019, in a tiny lung nodule of this size, neoplasm is a distinct consideration. Primary  bronchogenic carcinoma or metastatic disease could have this appearance. Recommend continued serial follow up to ensure stability.    Colonoscopy: 2018 Mammogram: She  is a current smoker 1-1.5 PPD with 75-pack-year history and has emphysema. No oxygen use. Not dyspneic unless climbs a lot of stairs.  Also s/p carotid end arterectomy for carotid stenosis.  Problem List: Patient Active Problem List   Diagnosis Date Noted  . Encounter for antineoplastic chemotherapy 03/13/2019  . Hyponatremia 02/27/2019  . Malignant neoplasm of cervix ([place]) 02/03/2019  . Goals of care, counseling/discussion 02/03/2019  . Carotid atherosclerosis 11/14/2016  . Myopia of both eyes 01/11/2016  . Lung nodules   . Essential hypertension 12/12/2014  . Hemoptysis 12/11/2014  . Medication monitoring encounter 11/25/2014  . Hyperlipidemia   . Status post carotid endarterectomy   . Hypothyroidism   . Tobacco use   . COPD (chronic obstructive pulmonary disease) ([place])   . Peripheral vascular disease ([place]) 09/15/2013    Past Medical History: Past Medical History:  Diagnosis Date  . Carotid atherosclerosis   . Carotid bruit   . COPD (chronic obstructive pulmonary disease) ([facility])    emphysema  . Hyperlipidemia   . Hypertension   . Hyponatremia 02/27/2019  . Hypothyroidism   . Infected cat bite 1980s   was hospitalized  . Tobacco use     Past Surgical History: Past Surgical History:  Procedure Laterality Date  . CAROTID ENDARTERECTOMY Right Oct. 2015   Dr. Lucky Cowboy  . CAROTID STENOSIS Left April 2016   carotid stenosis surgery  . COLONOSCOPY WITH PROPOFOL N/A 11/08/2016   Procedure: COLONOSCOPY WITH PROPOFOL;  Surgeon: Jonathon Bellows, MD;  [facility];  Service: Gastroenterology;  Laterality: N/A;  . ESOPHAGOGASTRODUODENOSCOPY (EGD) WITH PROPOFOL N/A 11/08/2016   Procedure: ESOPHAGOGASTRODUODENOSCOPY (EGD) WITH PROPOFOL;  Surgeon: Jonathon Bellows, MD;  Location: [facility] ENDOSCOPY;  Service:  Gastroenterology;  Laterality: N/A;  . FLEXIBLE BRONCHOSCOPY N/A 12/17/2014   Procedure: FLEXIBLE BRONCHOSCOPY;  Surgeon: Wilhelmina Mcardle, MD;  Location: [facility] ORS;  Service: Pulmonary;  Laterality: N/A;  . INCISION AND DRAINAGE / EXCISION THYROGLOSSAL CYST  April 2011    Past Ob/Gyn History: underwent menopause many years ago, no HRT.  Had 5 children. Not sexually active, husband had prostate cancer.      Family History: Family History  Problem Relation Age of Onset  . Congestive Heart Failure Mother   . Heart disease Mother   . Hypertension Mother   . Hypertension Sister   . Diabetes Sister   . Heart disease Sister   . Cancer Brother        liver, lung  . Heart disease Maternal Grandmother   . Heart disease Maternal Uncle   . Stroke Maternal Grandfather   . Heart disease Brother   . Hypertension Brother   . Heart attack Brother   . COPD Neg Hx     Social History: Social History   Socioeconomic History  . Marital status: Married    Spouse name: Not on file  . Number of children: Not on file  . Years of education: Not on file  . Highest education level: 9th grade  Occupational History  . Occupation: retired   Tobacco Use  . Smoking status: Current Every Day Smoker    Packs/day: 1.50    Years: 50.00    Pack years: 75.00    Types: Cigarettes  . Smokeless tobacco: Never Used  Substance and Sexual Activity  . Alcohol use: No    Alcohol/week: 0.0 standard drinks  . Drug use: No  . Sexual activity: Yes  Other Topics Concern  . Not on file  Social History Narrative  Lives at home with husband   Social Determinants of Health   Financial Resource Strain:   . Difficulty of Paying Living Expenses:   Food Insecurity:   . Worried About Charity fundraiser in the Last Year:   . Arboriculturist in the Last Year:   Transportation Needs:   . Film/video editor (Medical):   Marland Kitchen Lack of Transportation (Non-Medical):   Physical Activity:   . Days of Exercise per  Week:   . Minutes of Exercise per Session:   Stress:   . Feeling of Stress :   Social Connections:   . Frequency of Communication with Friends and Family:   . Frequency of Social Gatherings with Friends and Family:   . Attends Religious Services:   . Active Member of Clubs or Organizations:   . Attends Archivist Meetings:   Marland Kitchen Marital Status:   Intimate Partner Violence:   . Fear of Current or Ex-Partner:   . Emotionally Abused:   Marland Kitchen Physically Abused:   . Sexually Abused:     Allergies: Allergies  Allergen Reactions  . Atorvastatin Other (See Comments)    dizziness    Current Medications: Current Outpatient Medications  Medication Sig Dispense Refill  . ASPIRIN 81 PO Take by mouth daily. 81 mg tablet    . budesonide-formoterol (SYMBICORT) 160-4.5 MCG/ACT inhaler Inhale 2 puffs into the lungs 2 (two) times daily. 1 Inhaler 12  . clopidogrel (PLAVIX) 75 MG tablet Take 1 tablet (75 mg total) by mouth daily. 90 tablet 3  . diphenhydrAMINE (BENADRYL) 25 mg capsule Take 25 mg by mouth at bedtime as needed.    . ferrous sulfate 325 (65 FE) MG EC tablet Take 1 tablet (325 mg total) by mouth daily with breakfast. 90 tablet 4  . levothyroxine (SYNTHROID) 175 MCG tablet Take 1 tablet (175 mcg total) by mouth daily. 30 tablet 1  . lidocaine-prilocaine (EMLA) cream Apply to affected area once 30 g 3  . lisinopril (ZESTRIL) 20 MG tablet Take 1 tablet (20 mg total) by mouth daily. 90 tablet 1  . Multiple Vitamin (MULTIVITAMIN) tablet Take 1 tablet by mouth daily.    . prochlorperazine (COMPAZINE) 10 MG tablet Take 1 tablet (10 mg total) by mouth every 6 (six) hours as needed (Nausea or vomiting). 30 tablet 1  . vitamin B-12 (CYANOCOBALAMIN) 500 MCG tablet Take 500 mcg by mouth daily.     No current facility-administered medications for this visit.   Review of Systems General:  no complaints Skin: no complaints Eyes: no complaints HEENT: no complaints Breasts: no  complaints Pulmonary: no complaints Cardiac: no complaints Gastrointestinal: some diarrhea Genitourinary/Sexual: some frequency Ob/Gyn: no complaints Musculoskeletal: no complaints Hematology: no complaints Neurologic/Psych: no complaints  Objective:  Physical Examination:  BP (!) 148/81 (BP Location: Left Arm, Patient Position: Sitting)   Pulse 96   Temp 97.7 F (36.5 C) (Tympanic)   Resp 18   Wt 131 lb (59.4 kg)   SpO2 100%   BMI 20.52 kg/m     ECOG Performance Status: 1 - Symptomatic but completely ambulatory  GENERAL: Patient is a well appearing female in no acute distress HEENT:  PERRL, neck supple with midline trachea. Thyroid without masses.  NODES:  No cervical, supraclavicular, axillary, or inguinal lymphadenopathy palpated.  LUNGS:  Clear to auscultation bilaterally.  No wheezes or rhonchi. HEART:  Regular rate and rhythm. No murmur appreciated. ABDOMEN:  Soft, nontender.  Positive, normoactive bowel sounds.  EXTREMITIES:  No peripheral edema.   SKIN:  Clear with no obvious rashes or skin changes. No nail dyscrasia. NEURO:  Nonfocal. Well oriented.  Appropriate affect.  Pelvic:exam chaperoned by nurse;  Vulva: normal appearing vulva with no masses, tenderness or lesions; Vagina: atrophic, no discharge. The cervix is atrophic and small with no lesions seen; The remainder of the exam was deferred.   Lab Review n/a  Radiologic Imaging: As per interval history and HPI    Assessment:  Mary Hoover is a 74 y.o. female diagnosed with probably stage IIIc (T1b N1 Mx) squamous cell cancer of the cervix. On PET/CT large primary cervical cancer and small bilateral pelvic sidewall lymph nodes show discernible FDG accumulation.  Treated with external radiation and carboplatin at [facility] and brachytherapy at [facility] and completed 2/21. NED on exam today.    Mild urinary frequency and diarrhea with radiation, but otherwise tolerated well.    Indeterminate 7 mm FDG active right  upper lobe lung nodule, concerning for primary bronchogenic carcinoma vs metastatic disease 01/08/2019. Repeat CT scan yesterday shows stability of the lesion.   Medical co-morbidities complicating care: heavy smoker/emphysema, vascular disease s/p carotid endarterectomy.  Plan:   Problem List Items Addressed This Visit      Genitourinary   Malignant neoplasm of cervix ([place]) - Primary     She will see Dr Baruch Gouty in [place] in one month.  Also scheduled to see Dr Tasia Catchings for follow up.  Discussed indeterminate lung nodule with Dr Pascal Lux in radiology.  He noted her severe emphysema and suggested repeat Chest CT in 6 months to follow up indeterminate pulmonary nodule.  On the other hand, am concerned that the lesion had some FDG activity.  Our NP Beckey Rutter will discuss with Dr Faith Rogue in thoracic surgery.  Perhaps she should have another PET scan to evaluate the nodule and confirm that patient in remission following recent chemoradiation for cervical cancer.   She is a smoker and we discussed cessation programs, but she was not interested having tried in the past. Has decreased from over a pack a day to to under a pack a day.    Verlon Au, NP  I personally interviewed and examined the patient. Agreed with the above/below plan of care. I have directly contributed to assessment and plan of care of this patient and educated and discussed with patient and family.  Mellody Drown, MD  CC:  Dr. Livingston Diones Ob-Gyn

## 2019-04-24 ENCOUNTER — Telehealth: Payer: Self-pay

## 2019-04-24 DIAGNOSIS — C539 Malignant neoplasm of cervix uteri, unspecified: Secondary | ICD-10-CM

## 2019-04-24 DIAGNOSIS — R918 Other nonspecific abnormal finding of lung field: Secondary | ICD-10-CM

## 2019-04-24 NOTE — Telephone Encounter (Signed)
Spoke with Dr. Genevive Bi at the request of Dr. Fransisca Connors regarding RUL nodule. Imaging reviewed. Dr. Genevive Bi will see in clinic. She also sees Dr.Chrystal 3/31 in follow up. Surgical/radiation/surveillance options to be determined.

## 2019-04-30 ENCOUNTER — Other Ambulatory Visit: Payer: Self-pay

## 2019-04-30 ENCOUNTER — Ambulatory Visit
Admission: RE | Admit: 2019-04-30 | Discharge: 2019-04-30 | Disposition: A | Discharge status: Home / Self Care | Payer: Medicare Other | Source: Ambulatory Visit | Attending: Radiation Oncology | Admitting: Radiation Oncology

## 2019-04-30 ENCOUNTER — Ambulatory Visit (INDEPENDENT_AMBULATORY_CARE_PROVIDER_SITE_OTHER): Payer: Medicare Other

## 2019-04-30 VITALS — BP 157/76 | HR 96 | Temp 97.6°F | Resp 20 | Wt 130.5 lb

## 2019-04-30 DIAGNOSIS — R918 Other nonspecific abnormal finding of lung field: Secondary | ICD-10-CM | POA: Diagnosis not present

## 2019-04-30 DIAGNOSIS — C539 Malignant neoplasm of cervix uteri, unspecified: Secondary | ICD-10-CM | POA: Diagnosis present

## 2019-04-30 DIAGNOSIS — Z0000 Encounter for general adult medical examination without abnormal findings: Secondary | ICD-10-CM | POA: Diagnosis not present

## 2019-04-30 DIAGNOSIS — M545 Low back pain: Secondary | ICD-10-CM | POA: Diagnosis not present

## 2019-04-30 DIAGNOSIS — Z923 Personal history of irradiation: Secondary | ICD-10-CM | POA: Insufficient documentation

## 2019-04-30 NOTE — Progress Notes (Signed)
Subjective:   Mary Hoover is a 74 y.o. female who presents for Medicare Annual (Subsequent) preventive examination.  This visit is being conducted via phone call  - after an attmept to do on video chat - due to the COVID-19 pandemic. This patient has given me verbal consent via phone to conduct this visit, patient states they are participating from their home address. Some vital signs may be absent or patient reported.   Patient identification: identified by name, DOB, and current address.    Review of Systems:   Cardiac Risk Factors include: advanced age (>31mn, >>6women)     Objective:     Vitals: There were no vitals taken for this visit.  There is no height or weight on file to calculate BMI.  Advanced Directives 04/30/2019 03/20/2019 03/06/2019 02/20/2019 02/03/2019 01/30/2019 01/22/2019  Does Patient Have a Medical Advance Directive? _0  No No  Copy of Healthcare Power of Attorney in Chart? - - - - - - -  Would patient like information on creating a medical advance directive? - - No - Patient declined - No - Patient declined - Yes (MAU/Ambulatory/Procedural Areas - Information given)    Tobacco Social History   Tobacco Use  Smoking Status Current Every Day Smoker  . Packs/day: 0.50  . Years: 50.00  . Pack years: 25.00  . Types: Cigarettes  Smokeless Tobacco Never Used  Tobacco Comment   cutting back, not ready to quit      Ready to quit: No Counseling given: Yes Comment: cutting back, not ready to quit    Clinical Intake:  Pre-visit preparation completed: Yes  Pain : 0-10 Pain Score: 0-No pain Pain Type: Chronic pain Pain Location: Back Pain Orientation: Lower Pain Radiating Towards: hips Pain Descriptors / Indicators: Throbbing, Aching Pain Onset: 1 to 4 weeks ago Pain Frequency: Intermittent Pain Relieving Factors: heating and ice pack  Pain Relieving Factors: heating and ice pack  Nutritional Risks: None Diabetes: No  How often do  you need to have someone help you when you read instructions, pamphlets, or other written materials from your doctor or pharmacy?: 1 - Never  Interpreter Needed?: No  Information entered by :: Stanton Kissoon,LPN  Past Medical History:  Diagnosis Date  . Carotid atherosclerosis   . Carotid bruit   . COPD (chronic obstructive pulmonary disease) (HCC)    emphysema  . Hyperlipidemia   . Hypertension   . Hyponatremia 02/27/2019  . Hypothyroidism   . Infected cat bite 1980s   was hospitalized  . Tobacco use    Past Surgical History:  Procedure Laterality Date  . CAROTID ENDARTERECTOMY Right Oct. 2015   Dr. DLucky Cowboy . CAROTID STENOSIS Left April 2016   carotid stenosis surgery  . COLONOSCOPY WITH PROPOFOL N/A 11/08/2016   Procedure: COLONOSCOPY WITH PROPOFOL;  Surgeon: AJonathon Bellows MD;  [facility]  Service: Gastroenterology;  Laterality: N/A;  . ESOPHAGOGASTRODUODENOSCOPY (EGD) WITH PROPOFOL N/A 11/08/2016   Procedure: ESOPHAGOGASTRODUODENOSCOPY (EGD) WITH PROPOFOL;  Surgeon: AJonathon Bellows MD;  [facility];  Service: Gastroenterology;  Laterality: N/A;  . FLEXIBLE BRONCHOSCOPY N/A 12/17/2014   Procedure: FLEXIBLE BRONCHOSCOPY;  Surgeon: DWilhelmina Mcardle MD;  Location: [facility] ORS;  Service: Pulmonary;  Laterality: N/A;  . INCISION AND DRAINAGE / EXCISION THYROGLOSSAL CYST  April 2011   Family History  Problem Relation Age of Onset  . Congestive Heart Failure Mother   . Heart disease Mother   . Hypertension Mother   .  Hypertension Sister   . Diabetes Sister   . Heart disease Sister   . Cancer Brother        liver, lung  . Heart disease Maternal Grandmother   . Heart disease Maternal Uncle   . Stroke Maternal Grandfather   . Heart disease Brother   . Hypertension Brother   . Heart attack Brother   . COPD Neg Hx    Social History   Socioeconomic History  . Marital status: Married    Spouse name: Not on file  . Number of children: Not on file  . Years  of education: Not on file  . Highest education level: 9th grade  Occupational History  . Occupation: retired   Tobacco Use  . Smoking status: Current Every Day Smoker    Packs/day: 0.50    Years: 50.00    Pack years: 25.00    Types: Cigarettes  . Smokeless tobacco: Never Used  . Tobacco comment: cutting back, not ready to quit   Substance and Sexual Activity  . Alcohol use: No    Alcohol/week: 0.0 standard drinks  . Drug use: No  . Sexual activity: Yes  Other Topics Concern  . Not on file  Social History Narrative   Lives at home with husband   Social Determinants of Health   Financial Resource Strain:   . Difficulty of Paying Living Expenses:   Food Insecurity:   . Worried About Charity fundraiser in the Last Year:   . Arboriculturist in the Last Year:   Transportation Needs:   . Film/video editor (Medical):   Marland Kitchen Lack of Transportation (Non-Medical):   Physical Activity:   . Days of Exercise per Week:   . Minutes of Exercise per Session:   Stress:   . Feeling of Stress :   Social Connections:   . Frequency of Communication with Friends and Family:   . Frequency of Social Gatherings with Friends and Family:   . Attends Religious Services:   . Active Member of Clubs or Organizations:   . Attends Archivist Meetings:   Marland Kitchen Marital Status:     Outpatient Encounter Medications as of 04/30/2019  Medication Sig  . ASPIRIN 81 PO Take by mouth daily. 81 mg tablet  . budesonide-formoterol (SYMBICORT) 160-4.5 MCG/ACT inhaler Inhale 2 puffs into the lungs 2 (two) times daily.  . clopidogrel (PLAVIX) 75 MG tablet Take 1 tablet (75 mg total) by mouth daily.  . ferrous sulfate 325 (65 FE) MG EC tablet Take 1 tablet (325 mg total) by mouth daily with breakfast.  . levothyroxine (SYNTHROID) 175 MCG tablet Take 1 tablet (175 mcg total) by mouth daily.  Marland Kitchen lidocaine-prilocaine (EMLA) cream Apply to affected area once  . lisinopril (ZESTRIL) 20 MG tablet Take 1 tablet  (20 mg total) by mouth daily.  . Multiple Vitamin (MULTIVITAMIN) tablet Take 1 tablet by mouth daily.  . vitamin B-12 (CYANOCOBALAMIN) 500 MCG tablet Take 500 mcg by mouth daily.  . diphenhydrAMINE (BENADRYL) 25 mg capsule Take 25 mg by mouth at bedtime as needed.  . prochlorperazine (COMPAZINE) 10 MG tablet Take 1 tablet (10 mg total) by mouth every 6 (six) hours as needed (Nausea or vomiting). (Patient not taking: Reported on 04/30/2019)   No facility-administered encounter medications on file as of 04/30/2019.    Activities of Daily Living In your present state of health, do you have any difficulty performing the following activities: 04/30/2019 07/11/2018  Hearing? Tempie Donning  Comment hearing aid for right ear -  Vision? N N  Comment eyeglasses, no eye dr -  Difficulty concentrating or making decisions? Tempie Donning  Comment dates -  Walking or climbing stairs? Y N  Comment sob -  Dressing or bathing? N N  Doing errands, shopping? N N  Comment grandson helps -  Conservation officer, nature and eating ? N -  Using the Toilet? N -  In the past six months, have you accidently leaked urine? N -  Do you have problems with loss of bowel control? N -  Managing your Medications? N -  Managing your Finances? N -  Housekeeping or managing your Housekeeping? N -  Some recent data might be hidden    Patient Care Team: [name], DO as PCP - General (Family Medicine) Clent Jacks, RN as Oncology Nurse Navigator Noreene Filbert, MD as Radiation Oncologist (Radiation Oncology)    Assessment:   This is a routine wellness examination for Mary Hoover.  Exercise Activities and Dietary recommendations Current Exercise Habits: The patient does not participate in regular exercise at present, Exercise limited by: None identified  Goals Addressed   None     Fall Risk: Fall Risk  04/30/2019 11/26/2018 04/25/2018 04/09/2017 01/11/2016  Falls in the past year? 0 0 0 No No  Number falls in past yr: 0 0 0 - -  Injury  with Fall? 0 0 - - -    FALL RISK PREVENTION PERTAINING TO THE HOME:  Any stairs in or around the home? Yes  If so, are there any without handrails? No   Home free of loose throw rugs in walkways, pet beds, electrical cords, etc? Yes  Adequate lighting in your home to reduce risk of falls? Yes   ASSISTIVE DEVICES UTILIZED TO PREVENT FALLS:  Life alert? No  Use of a cane, walker or w/c? No  Grab bars in the bathroom? No  Shower chair or bench in shower? No  Elevated toilet seat or a handicapped toilet? No   DME ORDERS:  DME order needed?  No   TIMED UP AND GO:  Unable to perform    Depression Screen PHQ 2/9 Scores 04/30/2019 07/11/2018 04/25/2018 04/09/2017  PHQ - 2 Score 1 0 0 0  PHQ- 9 Score - 0 - -     Cognitive Function     6CIT Screen 04/25/2018 04/09/2017 01/11/2016  What Year? 0 points 0 points 0 points  What month? 0 points 0 points 0 points  What time? 0 points 0 points 0 points  Count back from 20 0 points 0 points 0 points  Months in reverse 0 points 0 points 2 points  Repeat phrase 0 points 0 points 2 points  Total Score 0 0 4    Immunization History  Administered Date(s) Administered  . Fluad Quad(high Dose 65+) 11/26/2018  . Influenza, High Dose Seasonal PF 01/11/2016, 11/16/2016, 10/16/2017  . Influenza,inj,Quad PF,6+ Mos 11/25/2014  . Pneumococcal Conjugate-13 07/13/2015  . Pneumococcal Polysaccharide-23 07/10/2012  . Tdap 07/13/2015    Qualifies for Shingles Vaccine? Yes  Zostavax completed n/a. Due for Shingrix. Education has been provided regarding the importance of this vaccine. Pt has been advised to call insurance company to determine out of pocket expense. Advised may also receive vaccine at local pharmacy or Health Dept. Verbalized acceptance and understanding.  Tdap: up to date .  Flu Vaccine: up to date   Pneumococcal Vaccine: up to date   Covid-19 Vaccine: Information provided  Screening Tests Health Maintenance  Topic Date Due    . MAMMOGRAM  07/11/2019 (Originally 03/27/1997)  . DEXA SCAN  07/11/2019 (Originally 03/27/2012)  . COLONOSCOPY  11/08/2021  . TETANUS/TDAP  07/12/2025  . INFLUENZA VACCINE  Completed  . Hepatitis C Screening  Completed  . PNA vac Low Risk Adult  Completed    Cancer Screenings:  Colorectal Screening: Completed 11/08/2016. Repeat every 5 years  Mammogram: declined  Bone Density: declined   Lung Cancer Screening: (Low Dose CT Chest recommended if Age 51-80 years, 30 pack-year currently smoking OR have quit w/in 15years.) does qualify.    ct completed 04/22/2019  Additional Screening:  Hepatitis C Screening: does qualify; Completed 04/09/2017  Vision Screening: Recommended annual ophthalmology exams for early detection of glaucoma and other disorders of the eye. Is the patient up to date with their annual eye exam?  No    Dental Screening: Recommended annual dental exams for proper oral hygiene  Community Resource Referral:  CRR required this visit?  No       Plan:  I have personally reviewed and addressed the Medicare Annual Wellness questionnaire and have noted the following in the patient's chart:  A. Medical and social history B. Use of alcohol, tobacco or illicit drugs  C. Current medications and supplements D. Functional ability and status E.  Nutritional status F.  Physical activity G. Advance directives H. List of other physicians I.  Hospitalizations, surgeries, and ER visits in previous 12 months J.  Tiger such as hearing and vision if needed, cognitive and depression L. Referrals and appointments   In addition, I have reviewed and discussed with patient certain preventive protocols, quality metrics, and best practice recommendations. A written personalized care plan for preventive services as well as general preventive health recommendations were provided to patient.  Signed,    Bevelyn Ngo, LPN  04/13/3886 Nurse Health  Advisor   Nurse Notes: none

## 2019-04-30 NOTE — Progress Notes (Signed)
Radiation Oncology Follow up Note  Name: Mary Hoover   Date:   04/30/2019 MRN:  [id number] DOB: 11-29-1945    This 74 y.o. female presents to the clinic today for 1 month follow-up status post radiation therapy for probable stage IIIc (T1b N1 MX) squamous cell carcinoma the cervix.  REFERRING PROVIDER: [name], DO  HPI: Patient is a 74 year old female now out 1 month having completed.  Both external beam radiation therapy as well as intracavitary brachytherapy for stage IIIc cervical cancer.  Seen today in routine follow-up she is doing fairly well.  She has had no vaginal discharge or bleed.  She has no abdominal pain she is complaining of pain in her lumbar spine.  She also is noted on CT scan to have a 7 mm lesion in her right upper lobe which we are following.  COMPLICATIONS OF TREATMENT: none  FOLLOW UP COMPLIANCE: keeps appointments   PHYSICAL EXAM:  BP (!) 157/76   Pulse 96   Temp 97.6 F (36.4 C) (Tympanic)   Resp 20   Wt 130 lb 8 oz (59.2 kg)   BMI 20.44 kg/m  Well-developed well-nourished patient in NAD. HEENT reveals PERLA, EOMI, discs not visualized.  Oral cavity is clear. No oral mucosal lesions are identified. Neck is clear without evidence of cervical or supraclavicular adenopathy. Lungs are clear to A&P. Cardiac examination is essentially unremarkable with regular rate and rhythm without murmur rub or thrill. Abdomen is benign with no organomegaly or masses noted. Motor sensory and DTR levels are equal and symmetric in the upper and lower extremities. Cranial nerves II through XII are grossly intact. Proprioception is intact. No peripheral adenopathy or edema is identified. No motor or sensory levels are noted. Crude visual fields are within normal range.  RADIOLOGY RESULTS: CT scan reviewed compatible with above-stated findings  PLAN: Present time patient is doing well with low side effect profile from concurrent chemoradiation.  If back pain persists may  need of lumbar MRI.  Also concerned about a right upper lobe nodule she is seeing Dr. Faith Rogue next week for opinion.  This area is PET positive concerning for malignancy.  We will see her back in 3 months if this lesion is enlarged may offer SBRT treatment.  Patient knows to call with any concerns at any time.  She already has follow-up appointments with GYN oncology.  I would like to take this opportunity to thank you for allowing me to participate in the care of your patient.Noreene Filbert, MD

## 2019-04-30 NOTE — Patient Instructions (Signed)
Mary Hoover , Thank you for taking time to come for your Medicare Wellness Visit. I appreciate your ongoing commitment to your health goals. Please review the following plan we discussed and let me know if I can assist you in the future.   Screening recommendations/referrals: Colonoscopy: up to date Mammogram: declined  Bone Density: declined  Recommended yearly ophthalmology/optometry visit for glaucoma screening and checkup Recommended yearly dental visit for hygiene and checkup  Vaccinations: Influenza vaccine: up to date Pneumococcal vaccine: up to date Tdap vaccine: up to date Shingles vaccine: shingrix eligible    Covid-19: We are recommending the vaccine to everyone who has not had an allergic reaction to any of the components of the vaccine. If you have specific questions about the vaccine, please bring them up with your health care provider to discuss them.   We will likely not be getting the vaccine in the office for the first rounds of vaccinations. The way they are releasing the vaccines is going to be through the health systems (like [facility], [facility], [facility], [facility]), through your county health department, or through the pharmacies.   The [facility] Department is giving vaccines to those 65+ and Health Care Workers Teachers and [place] providers start 03/26/19, Essential workers start 3/10 and those with co-morbidities start 04/23/19 Call [phone] to schedule  If you are 65+ you can get a vaccine through [facility] by signing up for an appointment.  You can sign up by going to: [email or web address].  You can get more information by going to: [email or web address]  [employer] next door is giving the [employer]- you can call [phone] or stop by there to schedule.  Advanced directives: Advance directive discussed with you today. Once this is complete please bring a copy in to our office so we can scan it into your  chart.  Conditions/risks identified: If you wish to quit smoking, help is available. For free tobacco cessation program offerings call the [facility] at 418-004-2470 or Live Well [facility] at 501-886-1264. You may also visit [email or web address] or email [email or web address] for more information on other programs.   Next appointment: follow up in one year for your annual wellness visit    Preventive Care 65 Years and Older, Female Preventive care refers to lifestyle choices and visits with your health care provider that can promote health and wellness. What does preventive care include?  A yearly physical exam. This is also called an annual well check.  Dental exams once or twice a year.  Routine eye exams. Ask your health care provider how often you should have your eyes checked.  Personal lifestyle choices, including:  Daily care of your teeth and gums.  Regular physical activity.  Eating a healthy diet.  Avoiding tobacco and drug use.  Limiting alcohol use.  Practicing safe sex.  Taking low-dose aspirin every day.  Taking vitamin and mineral supplements as recommended by your health care provider. What happens during an annual well check? The services and screenings done by your health care provider during your annual well check will depend on your age, overall health, lifestyle risk factors, and family history of disease. Counseling  Your health care provider may ask you questions about your:  Alcohol use.  Tobacco use.  Drug use.  Emotional well-being.  Home and relationship well-being.  Sexual activity.  Eating habits.  History of falls.  Memory and ability to understand (cognition).  Work and work Statistician.  Reproductive health. Screening  You  may have the following tests or measurements:  Height, weight, and BMI.  Blood pressure.  Lipid and cholesterol levels. These may be checked every 5 years, or more frequently if you  are over 40 years old.  Skin check.  Lung cancer screening. You may have this screening every year starting at age 55 if you have a 30-pack-year history of smoking and currently smoke or have quit within the past 15 years.  Fecal occult blood test (FOBT) of the stool. You may have this test every year starting at age 41.  Flexible sigmoidoscopy or colonoscopy. You may have a sigmoidoscopy every 5 years or a colonoscopy every 10 years starting at age 20.  Hepatitis C blood test.  Hepatitis B blood test.  Sexually transmitted disease (STD) testing.  Diabetes screening. This is done by checking your blood sugar (glucose) after you have not eaten for a while (fasting). You may have this done every 1-3 years.  Bone density scan. This is done to screen for osteoporosis. You may have this done starting at age 29.  Mammogram. This may be done every 1-2 years. Talk to your health care provider about how often you should have regular mammograms. Talk with your health care provider about your test results, treatment options, and if necessary, the need for more tests. Vaccines  Your health care provider may recommend certain vaccines, such as:  Influenza vaccine. This is recommended every year.  Tetanus, diphtheria, and acellular pertussis (Tdap, Td) vaccine. You may need a Td booster every 10 years.  Zoster vaccine. You may need this after age 29.  Pneumococcal 13-valent conjugate (PCV13) vaccine. One dose is recommended after age 69.  Pneumococcal polysaccharide (PPSV23) vaccine. One dose is recommended after age 34. Talk to your health care provider about which screenings and vaccines you need and how often you need them. This information is not intended to replace advice given to you by your health care provider. Make sure you discuss any questions you have with your health care provider. Document Released: 02/12/2015 Document Revised: 10/06/2015 Document Reviewed: 11/17/2014 Elsevier  Interactive Patient Education  2017 [place] Prevention in the Home Falls can cause injuries. They can happen to people of all ages. There are many things you can do to make your home safe and to help prevent falls. What can I do on the outside of my home?  Regularly fix the edges of walkways and driveways and fix any cracks.  Remove anything that might make you trip as you walk through a door, such as a raised step or threshold.  Trim any bushes or trees on the path to your home.  Use bright outdoor lighting.  Clear any walking paths of anything that might make someone trip, such as rocks or tools.  Regularly check to see if handrails are loose or broken. Make sure that both sides of any steps have handrails.  Any raised decks and porches should have guardrails on the edges.  Have any leaves, snow, or ice cleared regularly.  Use sand or salt on walking paths during winter.  Clean up any spills in your garage right away. This includes oil or grease spills. What can I do in the bathroom?  Use night lights.  Install grab bars by the toilet and in the tub and shower. Do not use towel bars as grab bars.  Use non-skid mats or decals in the tub or shower.  If you need to sit down in the shower, use  a plastic, non-slip stool.  Keep the floor dry. Clean up any water that spills on the floor as soon as it happens.  Remove soap buildup in the tub or shower regularly.  Attach bath mats securely with double-sided non-slip rug tape.  Do not have throw rugs and other things on the floor that can make you trip. What can I do in the bedroom?  Use night lights.  Make sure that you have a light by your bed that is easy to reach.  Do not use any sheets or blankets that are too big for your bed. They should not hang down onto the floor.  Have a firm chair that has side arms. You can use this for support while you get dressed.  Do not have throw rugs and other things on the  floor that can make you trip. What can I do in the kitchen?  Clean up any spills right away.  Avoid walking on wet floors.  Keep items that you use a lot in easy-to-reach places.  If you need to reach something above you, use a strong step stool that has a grab bar.  Keep electrical cords out of the way.  Do not use floor polish or wax that makes floors slippery. If you must use wax, use non-skid floor wax.  Do not have throw rugs and other things on the floor that can make you trip. What can I do with my stairs?  Do not leave any items on the stairs.  Make sure that there are handrails on both sides of the stairs and use them. Fix handrails that are broken or loose. Make sure that handrails are as long as the stairways.  Check any carpeting to make sure that it is firmly attached to the stairs. Fix any carpet that is loose or worn.  Avoid having throw rugs at the top or bottom of the stairs. If you do have throw rugs, attach them to the floor with carpet tape.  Make sure that you have a light switch at the top of the stairs and the bottom of the stairs. If you do not have them, ask someone to add them for you. What else can I do to help prevent falls?  Wear shoes that:  Do not have high heels.  Have rubber bottoms.  Are comfortable and fit you well.  Are closed at the toe. Do not wear sandals.  If you use a stepladder:  Make sure that it is fully opened. Do not climb a closed stepladder.  Make sure that both sides of the stepladder are locked into place.  Ask someone to hold it for you, if possible.  Clearly mark and make sure that you can see:  Any grab bars or handrails.  First and last steps.  Where the edge of each step is.  Use tools that help you move around (mobility aids) if they are needed. These include:  Canes.  Walkers.  Scooters.  Crutches.  Turn on the lights when you go into a dark area. Replace any light bulbs as soon as they burn  out.  Set up your furniture so you have a clear path. Avoid moving your furniture around.  If any of your floors are uneven, fix them.  If there are any pets around you, be aware of where they are.  Review your medicines with your doctor. Some medicines can make you feel dizzy. This can increase your chance of falling. Ask your doctor what  other things that you can do to help prevent falls. This information is not intended to replace advice given to you by your health care provider. Make sure you discuss any questions you have with your health care provider. Document Released: 11/12/2008 Document Revised: 06/24/2015 Document Reviewed: 02/20/2014 Elsevier Interactive Patient Education  2017 [employer].

## 2019-05-01 ENCOUNTER — Ambulatory Visit (INDEPENDENT_AMBULATORY_CARE_PROVIDER_SITE_OTHER): Payer: Medicare Other | Admitting: Family Medicine

## 2019-05-01 ENCOUNTER — Ambulatory Visit: Payer: Medicare Other

## 2019-05-01 ENCOUNTER — Other Ambulatory Visit: Payer: Self-pay

## 2019-05-01 ENCOUNTER — Encounter: Payer: Self-pay | Admitting: Family Medicine

## 2019-05-01 VITALS — BP 144/73 | HR 91 | Temp 97.9°F | Ht 67.0 in | Wt 128.6 lb

## 2019-05-01 DIAGNOSIS — J449 Chronic obstructive pulmonary disease, unspecified: Secondary | ICD-10-CM | POA: Diagnosis not present

## 2019-05-01 DIAGNOSIS — E034 Atrophy of thyroid (acquired): Secondary | ICD-10-CM | POA: Diagnosis not present

## 2019-05-01 DIAGNOSIS — E782 Mixed hyperlipidemia: Secondary | ICD-10-CM

## 2019-05-01 DIAGNOSIS — Z0000 Encounter for general adult medical examination without abnormal findings: Secondary | ICD-10-CM

## 2019-05-01 DIAGNOSIS — Z1231 Encounter for screening mammogram for malignant neoplasm of breast: Secondary | ICD-10-CM

## 2019-05-01 DIAGNOSIS — C539 Malignant neoplasm of cervix uteri, unspecified: Secondary | ICD-10-CM

## 2019-05-01 DIAGNOSIS — I739 Peripheral vascular disease, unspecified: Secondary | ICD-10-CM | POA: Diagnosis not present

## 2019-05-01 DIAGNOSIS — I10 Essential (primary) hypertension: Secondary | ICD-10-CM | POA: Diagnosis not present

## 2019-05-01 DIAGNOSIS — Z13820 Encounter for screening for osteoporosis: Secondary | ICD-10-CM

## 2019-05-01 LAB — UA/M W/RFLX CULTURE, ROUTINE
Bilirubin, UA: NEGATIVE
Glucose, UA: NEGATIVE
Ketones, UA: NEGATIVE
Leukocytes,UA: NEGATIVE
Nitrite, UA: NEGATIVE
Protein,UA: NEGATIVE
Specific Gravity, UA: 1.015 (ref 1.005–1.030)
Urobilinogen, Ur: 0.2 mg/dL (ref 0.2–1.0)
pH, UA: 5 (ref 5.0–7.5)

## 2019-05-01 LAB — MICROALBUMIN, URINE WAIVED
Creatinine, Urine Waived: 50 mg/dL (ref 10–300)
Microalb, Ur Waived: 10 mg/L (ref 0–19)

## 2019-05-01 LAB — MICROSCOPIC EXAMINATION
Bacteria, UA: NONE SEEN
WBC, UA: NONE SEEN /hpf (ref 0–5)

## 2019-05-01 MED ORDER — BUDESONIDE-FORMOTEROL FUMARATE 160-4.5 MCG/ACT IN AERO: 2.0000 | INHALATION_SPRAY | Freq: Two times a day (BID) | RESPIRATORY_TRACT | 12 refills | Status: DC

## 2019-05-01 MED ORDER — LISINOPRIL 20 MG PO TABS: 20.0000 mg | ORAL_TABLET | Freq: Every day | ORAL | 1 refills | Status: DC

## 2019-05-01 MED ORDER — CLOPIDOGREL BISULFATE 75 MG PO TABS: 75.0000 mg | ORAL_TABLET | Freq: Every day | ORAL | 3 refills | Status: DC

## 2019-05-01 NOTE — Patient Instructions (Addendum)
Call to schedule your bone density and mammogram: [facility] at [facility]  Address: [address], [place], [place] [address]  Phone: [phone]   Health Maintenance After Age 72 After age 55, you are at a higher risk for certain long-term diseases and infections as well as injuries from falls. Falls are a major cause of broken bones and head injuries in people who are older than age 15. Getting regular preventive care can help to keep you healthy and well. Preventive care includes getting regular testing and making lifestyle changes as recommended by your health care provider. Talk with your health care provider about:  Which screenings and tests you should have. A screening is a test that checks for a disease when you have no symptoms.  A diet and exercise plan that is right for you. What should I know about screenings and tests to prevent falls? Screening and testing are the best ways to find a health problem early. Early diagnosis and treatment give you the best chance of managing medical conditions that are common after age 70. Certain conditions and lifestyle choices may make you more likely to have a fall. Your health care provider may recommend:  Regular vision checks. Poor vision and conditions such as cataracts can make you more likely to have a fall. If you wear glasses, make sure to get your prescription updated if your vision changes.  Medicine review. Work with your health care provider to regularly review all of the medicines you are taking, including over-the-counter medicines. Ask your health care provider about any side effects that may make you more likely to have a fall. Tell your health care provider if any medicines that you take make you feel dizzy or sleepy.  Osteoporosis screening. Osteoporosis is a condition that causes the bones to get weaker. This can make the bones weak and cause them to break more easily.  Blood pressure screening.  Blood pressure changes and medicines to control blood pressure can make you feel dizzy.  Strength and balance checks. Your health care provider may recommend certain tests to check your strength and balance while standing, walking, or changing positions.  Foot health exam. Foot pain and numbness, as well as not wearing proper footwear, can make you more likely to have a fall.  Depression screening. You may be more likely to have a fall if you have a fear of falling, feel emotionally low, or feel unable to do activities that you used to do.  Alcohol use screening. Using too much alcohol can affect your balance and may make you more likely to have a fall. What actions can I take to lower my risk of falls? General instructions  Talk with your health care provider about your risks for falling. Tell your health care provider if: ? You fall. Be sure to tell your health care provider about all falls, even ones that seem minor. ? You feel dizzy, sleepy, or off-balance.  Take over-the-counter and prescription medicines only as told by your health care provider. These include any supplements.  Eat a healthy diet and maintain a healthy weight. A healthy diet includes low-fat dairy products, low-fat (lean) meats, and fiber from whole grains, beans, and lots of fruits and vegetables. Home safety  Remove any tripping hazards, such as rugs, cords, and clutter.  Install safety equipment such as grab bars in bathrooms and safety rails on stairs.  Keep rooms and walkways well-lit. Activity   Follow a regular exercise program  to stay fit. This will help you maintain your balance. Ask your health care provider what types of exercise are appropriate for you.  If you need a cane or walker, use it as recommended by your health care provider.  Wear supportive shoes that have nonskid soles. Lifestyle  Do not drink alcohol if your health care provider tells you not to drink.  If you drink alcohol, limit  how much you have: ? 0-1 drink a day for women. ? 0-2 drinks a day for men.  Be aware of how much alcohol is in your drink. In the [place] one drink equals one typical bottle of beer (12 oz), one-half glass of wine (5 oz), or one shot of hard liquor (1 oz).  Do not use any products that contain nicotine or tobacco, such as cigarettes and e-cigarettes. If you need help quitting, ask your health care provider. Summary  Having a healthy lifestyle and getting preventive care can help to protect your health and wellness after age 30.  Screening and testing are the best way to find a health problem early and help you avoid having a fall. Early diagnosis and treatment give you the best chance for managing medical conditions that are more common for people who are older than age 1.  Falls are a major cause of broken bones and head injuries in people who are older than age 86. Take precautions to prevent a fall at home.  Work with your health care provider to learn what changes you can make to improve your health and wellness and to prevent falls. This information is not intended to replace advice given to you by your health care provider. Make sure you discuss any questions you have with your health care provider. Document Revised: 05/09/2018 Document Reviewed: 11/29/2016 Elsevier Patient Education  2020 [employer].

## 2019-05-01 NOTE — Assessment & Plan Note (Signed)
Rechecking levels today. Await results. Treat as needed.  

## 2019-05-01 NOTE — Progress Notes (Signed)
BP (!) 144/73 (BP Location: Left Arm, Patient Position: Sitting, Cuff Size: Small)   Pulse 91   Temp 97.9 F (36.6 C) (Oral)   Ht _0  (1.702 m)   Wt 128 lb 9.6 oz (58.3 kg)   SpO2 96%   BMI 20.14 kg/m    Subjective:    Patient ID: Mary Hoover, female    DOB: 11/17/1945, 74 y.o.   MRN: [id number]  HPI: Mary Hoover is a 74 y.o. female presenting on 05/01/2019 for comprehensive medical examination. Current medical complaints include:  HYPERTENSION / HYPERLIPIDEMIA Satisfied with current treatment? yes Duration of hypertension: chronic BP monitoring frequency: not checking BP medication side effects: no Past BP meds: lisinopril Duration of hyperlipidemia: chronic Cholesterol medication side effects: not on anything Medication compliance: excellent compliance Aspirin: yes Recent stressors: no Recurrent headaches: no Visual changes: no Palpitations: no Dyspnea: no Chest pain: no Lower extremity edema: no Dizzy/lightheaded: no  HYPOTHYROIDISM Thyroid control status:controlled Satisfied with current treatment? yes Medication side effects: no Medication compliance: excellent compliance Recent dose adjustment:no Fatigue: yes Cold intolerance: no Heat intolerance: no Weight gain: no Weight loss: yes Constipation: yes Diarrhea/loose stools: yes Palpitations: no Lower extremity edema: no Anxiety/depressed mood: no  COPD COPD status: stable Satisfied with current treatment?: yes Oxygen use: no Dyspnea frequency: occasionally Cough frequency: occasionally Rescue inhaler frequency:  never Limitation of activity: no Productive cough: no Pneumovax: Up to Date Influenza: Up to Date  Menopausal Symptoms: no  Depression Screen done today and results listed below:  Depression screen [facility] 2/9 04/30/2019 07/11/2018 04/25/2018 04/09/2017 01/11/2016  Decreased Interest 0 0 0 0 0  Down, Depressed, Hopeless 1 0 0 0 0  PHQ - 2 Score 1 0 0 0 0  Altered sleeping - 0  - - -  Tired, decreased energy - 0 - - -  Change in appetite - 0 - - -  Feeling bad or failure about yourself  - 0 - - -  Trouble concentrating - 0 - - -  Moving slowly or fidgety/restless - 0 - - -  Suicidal thoughts - 0 - - -  PHQ-9 Score - 0 - - -  Difficult doing work/chores - Not difficult at all - - -    Past Medical History:  Past Medical History:  Diagnosis Date  . Carotid atherosclerosis   . Carotid bruit   . COPD (chronic obstructive pulmonary disease) (HCC)    emphysema  . Hyperlipidemia   . Hypertension   . Hyponatremia 02/27/2019  . Hypothyroidism   . Infected cat bite 1980s   was hospitalized  . Tobacco use     Surgical History:  Past Surgical History:  Procedure Laterality Date  . CAROTID ENDARTERECTOMY Right Oct. 2015   Dr. Lucky Cowboy  . CAROTID STENOSIS Left April 2016   carotid stenosis surgery  . COLONOSCOPY WITH PROPOFOL N/A 11/08/2016   Procedure: COLONOSCOPY WITH PROPOFOL;  Surgeon: Jonathon Bellows, MD;  Location: [facility] ENDOSCOPY;  Service: Gastroenterology;  Laterality: N/A;  . ESOPHAGOGASTRODUODENOSCOPY (EGD) WITH PROPOFOL N/A 11/08/2016   Procedure: ESOPHAGOGASTRODUODENOSCOPY (EGD) WITH PROPOFOL;  Surgeon: Jonathon Bellows, MD;  Location: [facility] ENDOSCOPY;  Service: Gastroenterology;  Laterality: N/A;  . FLEXIBLE BRONCHOSCOPY N/A 12/17/2014   Procedure: FLEXIBLE BRONCHOSCOPY;  Surgeon: Wilhelmina Mcardle, MD;  Location: [facility] ORS;  Service: Pulmonary;  Laterality: N/A;  . INCISION AND DRAINAGE / EXCISION THYROGLOSSAL CYST  April 2011    Medications:  Current Outpatient Medications on File Prior to Visit  Medication  Sig  . ASPIRIN 81 PO Take by mouth daily. 81 mg tablet  . diphenhydrAMINE (BENADRYL) 25 mg capsule Take 25 mg by mouth at bedtime as needed.  . ferrous sulfate 325 (65 FE) MG EC tablet Take 1 tablet (325 mg total) by mouth daily with breakfast.  . levothyroxine (SYNTHROID) 175 MCG tablet Take 1 tablet (175 mcg total) by mouth daily.  Marland Kitchen  lidocaine-prilocaine (EMLA) cream Apply to affected area once  . Multiple Vitamin (MULTIVITAMIN) tablet Take 1 tablet by mouth daily.  . prochlorperazine (COMPAZINE) 10 MG tablet Take 1 tablet (10 mg total) by mouth every 6 (six) hours as needed (Nausea or vomiting).  . vitamin B-12 (CYANOCOBALAMIN) 500 MCG tablet Take 500 mcg by mouth daily.   No current facility-administered medications on file prior to visit.    Allergies:  Allergies  Allergen Reactions  . Atorvastatin Other (See Comments)    dizziness    Social History:  Social History   Socioeconomic History  . Marital status: Married    Spouse name: Not on file  . Number of children: Not on file  . Years of education: Not on file  . Highest education level: 9th grade  Occupational History  . Occupation: retired   Tobacco Use  . Smoking status: Current Every Day Smoker    Packs/day: 0.50    Years: 50.00    Pack years: 25.00    Types: Cigarettes  . Smokeless tobacco: Never Used  . Tobacco comment: cutting back, not ready to quit   Substance and Sexual Activity  . Alcohol use: No    Alcohol/week: 0.0 standard drinks  . Drug use: No  . Sexual activity: Yes  Other Topics Concern  . Not on file  Social History Narrative   Lives at home with husband   Social Determinants of Health   Financial Resource Strain:   . Difficulty of Paying Living Expenses:   Food Insecurity:   . Worried About Charity fundraiser in the Last Year:   . Arboriculturist in the Last Year:   Transportation Needs:   . Film/video editor (Medical):   Marland Kitchen Lack of Transportation (Non-Medical):   Physical Activity:   . Days of Exercise per Week:   . Minutes of Exercise per Session:   Stress:   . Feeling of Stress :   Social Connections:   . Frequency of Communication with Friends and Family:   . Frequency of Social Gatherings with Friends and Family:   . Attends Religious Services:   . Active Member of Clubs or Organizations:   .  Attends Archivist Meetings:   Marland Kitchen Marital Status:   Intimate Partner Violence:   . Fear of Current or Ex-Partner:   . Emotionally Abused:   Marland Kitchen Physically Abused:   . Sexually Abused:    Social History   Tobacco Use  Smoking Status Current Every Day Smoker  . Packs/day: 0.50  . Years: 50.00  . Pack years: 25.00  . Types: Cigarettes  Smokeless Tobacco Never Used  Tobacco Comment   cutting back, not ready to quit    Social History   Substance and Sexual Activity  Alcohol Use No  . Alcohol/week: 0.0 standard drinks    Family History:  Family History  Problem Relation Age of Onset  . Congestive Heart Failure Mother   . Heart disease Mother   . Hypertension Mother   . Hypertension Sister   . Diabetes Sister   .  Heart disease Sister   . Cancer Brother        liver, lung  . Heart disease Maternal Grandmother   . Heart disease Maternal Uncle   . Stroke Maternal Grandfather   . Heart disease Brother   . Hypertension Brother   . Heart attack Brother   . COPD Neg Hx     Past medical history, surgical history, medications, allergies, family history and social history reviewed with patient today and changes made to appropriate areas of the chart.   Review of Systems  Constitutional: Positive for weight loss. Negative for chills, diaphoresis, fever and malaise/fatigue.  HENT: Positive for hearing loss. Negative for congestion, ear discharge, ear pain, nosebleeds, sinus pain, sore throat and tinnitus.   Eyes: Negative.   Respiratory: Positive for cough and shortness of breath. Negative for hemoptysis, sputum production, wheezing and stridor.   Cardiovascular: Negative.   Gastrointestinal: Positive for constipation and diarrhea. Negative for abdominal pain, blood in stool, heartburn, melena, nausea and vomiting.  Genitourinary: Negative.   Musculoskeletal: Negative.   Skin: Negative.   Neurological: Negative.   Endo/Heme/Allergies: Negative for environmental  allergies and polydipsia. Bruises/bleeds easily.  Psychiatric/Behavioral: Negative.     All other ROS negative except what is listed above and in the HPI.      Objective:    BP (!) 144/73 (BP Location: Left Arm, Patient Position: Sitting, Cuff Size: Small)   Pulse 91   Temp 97.9 F (36.6 C) (Oral)   Ht _0  (1.702 m)   Wt 128 lb 9.6 oz (58.3 kg)   SpO2 96%   BMI 20.14 kg/m   Wt Readings from Last 3 Encounters:  05/01/19 128 lb 9.6 oz (58.3 kg)  04/30/19 130 lb 8 oz (59.2 kg)  04/23/19 131 lb (59.4 kg)    Physical Exam Vitals and nursing note reviewed.  Constitutional:      General: She is not in acute distress.    Appearance: Normal appearance. She is not ill-appearing, toxic-appearing or diaphoretic.  HENT:     Head: Normocephalic and atraumatic.     Right Ear: Tympanic membrane, ear canal and external ear normal. There is no impacted cerumen.     Left Ear: Tympanic membrane, ear canal and external ear normal. There is no impacted cerumen.     Nose: Nose normal. No congestion or rhinorrhea.     Mouth/Throat:     Mouth: Mucous membranes are moist.     Pharynx: Oropharynx is clear. No oropharyngeal exudate or posterior oropharyngeal erythema.  Eyes:     General: No scleral icterus.       Right eye: No discharge.        Left eye: No discharge.     Extraocular Movements: Extraocular movements intact.     Conjunctiva/sclera: Conjunctivae normal.     Pupils: Pupils are equal, round, and reactive to light.  Neck:     Vascular: No carotid bruit.  Cardiovascular:     Rate and Rhythm: Normal rate and regular rhythm.     Pulses: Normal pulses.     Heart sounds: No murmur. No friction rub. No gallop.   Pulmonary:     Effort: Pulmonary effort is normal. No respiratory distress.     Breath sounds: Normal breath sounds. No stridor. No wheezing, rhonchi or rales.  Chest:     Chest wall: No tenderness.  Abdominal:     General: Abdomen is flat. Bowel sounds are normal. There is  no distension.  Palpations: Abdomen is soft. There is no mass.     Tenderness: There is no abdominal tenderness. There is no right CVA tenderness, left CVA tenderness, guarding or rebound.     Hernia: No hernia is present.  Genitourinary:    Comments: Breast and pelvic exams deferred with shared decision making Musculoskeletal:        General: No swelling, tenderness, deformity or signs of injury.     Cervical back: Normal range of motion and neck supple. No rigidity. No muscular tenderness.     Right lower leg: No edema.     Left lower leg: No edema.  Lymphadenopathy:     Cervical: No cervical adenopathy.  Skin:    General: Skin is warm and dry.     Capillary Refill: Capillary refill takes less than 2 seconds.     Coloration: Skin is not jaundiced or pale.     Findings: No bruising, erythema, lesion or rash.  Neurological:     General: No focal deficit present.     Mental Status: She is alert and oriented to person, place, and time. Mental status is at baseline.     Cranial Nerves: No cranial nerve deficit.     Sensory: No sensory deficit.     Motor: No weakness.     Coordination: Coordination normal.     Gait: Gait normal.     Deep Tendon Reflexes: Reflexes normal.  Psychiatric:        Mood and Affect: Mood normal.        Behavior: Behavior normal.        Thought Content: Thought content normal.        Judgment: Judgment normal.     Results for orders placed or performed in visit on 03/20/19  Comprehensive metabolic panel  Result Value Ref Range   Sodium 135 135 - 145 mmol/L   Potassium 4.2 3.5 - 5.1 mmol/L   Chloride 104 98 - 111 mmol/L   CO2 22 22 - 32 mmol/L   Glucose, Bld 102 (H) 70 - 99 mg/dL   BUN 16 8 - 23 mg/dL   Creatinine, Ser 0.89 0.44 - 1.00 mg/dL   Calcium 9.0 8.9 - 10.3 mg/dL   Total Protein 7.1 6.5 - 8.1 g/dL   Albumin 3.9 3.5 - 5.0 g/dL   AST 15 15 - 41 U/L   ALT 14 0 - 44 U/L   Alkaline Phosphatase 58 38 - 126 U/L   Total Bilirubin 0.5 0.3 -  1.2 mg/dL   GFR calc non Af Amer >60 >60 mL/min   GFR calc Af Amer >60 >60 mL/min   Anion gap 9 5 - 15  CBC with Differential  Result Value Ref Range   WBC 4.3 4.0 - 10.5 K/uL   RBC 3.27 (L) 3.87 - 5.11 MIL/uL   Hemoglobin 10.6 (L) 12.0 - 15.0 g/dL   HCT 31.4 (L) 36.0 - 46.0 %   MCV 96.0 80.0 - 100.0 fL   MCH 32.4 26.0 - 34.0 pg   MCHC 33.8 30.0 - 36.0 g/dL   RDW 13.4 11.5 - 15.5 %   Platelets 201 150 - 400 K/uL   nRBC 0.0 0.0 - 0.2 %   Neutrophils Relative % 79 %   Neutro Abs 3.5 1.7 - 7.7 K/uL   Lymphocytes Relative 7 %   Lymphs Abs 0.3 (L) 0.7 - 4.0 K/uL   Monocytes Relative 10 %   Monocytes Absolute 0.4 0.1 - 1.0 K/uL   Eosinophils  Relative 2 %   Eosinophils Absolute 0.1 0.0 - 0.5 K/uL   Basophils Relative 1 %   Basophils Absolute 0.0 0.0 - 0.1 K/uL   Immature Granulocytes 1 %   Abs Immature Granulocytes 0.03 0.00 - 0.07 K/uL      Assessment & Plan:   Problem List Items Addressed This Visit      Cardiovascular and Mediastinum   Essential hypertension    Under good control on current regimen. Continue current regimen. Continue to monitor. Call with any concerns. Refills given. Labs drawn today.       Relevant Medications   lisinopril (ZESTRIL) 20 MG tablet   Other Relevant Orders   CBC with Differential/Platelet   Comprehensive metabolic panel   Microalbumin, Urine Waived   UA/M w/rflx Culture, Routine   Peripheral vascular disease (HCC)    Continue plavix. Will keep BP and cholesterol under good control. Continue to monitor.      Relevant Medications   lisinopril (ZESTRIL) 20 MG tablet   Other Relevant Orders   CBC with Differential/Platelet   Comprehensive metabolic panel   UA/M w/rflx Culture, Routine     Respiratory   COPD (chronic obstructive pulmonary disease) (HCC)    Under good control on current regimen. Continue current regimen. Continue to monitor. Call with any concerns. Refills given. Labs drawn today.      Relevant Medications    budesonide-formoterol (SYMBICORT) 160-4.5 MCG/ACT inhaler   Other Relevant Orders   CBC with Differential/Platelet   Comprehensive metabolic panel   UA/M w/rflx Culture, Routine     Endocrine   Hypothyroidism    Rechecking levels today. Await results. Treat as needed.       Relevant Orders   CBC with Differential/Platelet   Comprehensive metabolic panel   TSH   UA/M w/rflx Culture, Routine     Genitourinary   Cervical cancer ([place])    Continue to follow with oncology. Continue to monitor.         Other   Hyperlipidemia    Cannot tolerate atorvastatin. Rechecking labs today. Will add if needed. Call with any concerns.       Relevant Medications   lisinopril (ZESTRIL) 20 MG tablet   Other Relevant Orders   CBC with Differential/Platelet   Comprehensive metabolic panel   Lipid Panel w/o Chol/HDL Ratio   UA/M w/rflx Culture, Routine    Other Visit Diagnoses    Routine general medical examination at a health care facility    -  Primary   Vaccines up to date. Screening labs checked today. Colonoscopy up to date. Mammogram and DEXA ordered. Pap UTD. Continue diet and exercise. Call with concerns.    Encounter for screening mammogram for malignant neoplasm of breast       Relevant Orders   MM 3D SCREEN BREAST BILATERAL   Screening for osteoporosis       DEXA ordered today.   Relevant Orders   DG Bone Density       Follow up plan: Return in about 6 months (around 10/31/2019).   LABORATORY TESTING:  - Pap smear: up to date  IMMUNIZATIONS:   - Tdap: Tetanus vaccination status reviewed: last tetanus booster within 10 years. - Influenza: Up to date - Pneumovax: Up to date - Prevnar: Up to date  SCREENING: -Mammogram: Ordered today  - Colonoscopy: Up to date  - Bone Density: Ordered today   PATIENT COUNSELING:   Advised to take 1 mg of folate supplement per day if capable of  pregnancy.   Sexuality: Discussed sexually transmitted diseases, partner selection, use  of condoms, avoidance of unintended pregnancy  and contraceptive alternatives.   Advised to avoid cigarette smoking.  I discussed with the patient that most people either abstain from alcohol or drink within safe limits (<=14/week and <=4 drinks/occasion for males, <=7/weeks and <= 3 drinks/occasion for females) and that the risk for alcohol disorders and other health effects rises proportionally with the number of drinks per week and how often a drinker exceeds daily limits.  Discussed cessation/primary prevention of drug use and availability of treatment for abuse.   Diet: Encouraged to adjust caloric intake to maintain  or achieve ideal body weight, to reduce intake of dietary saturated fat and total fat, to limit sodium intake by avoiding high sodium foods and not adding table salt, and to maintain adequate dietary potassium and calcium preferably from fresh fruits, vegetables, and low-fat dairy products.    stressed the importance of regular exercise  Injury prevention: Discussed safety belts, safety helmets, smoke detector, smoking near bedding or upholstery.   Dental health: Discussed importance of regular tooth brushing, flossing, and dental visits.    NEXT PREVENTATIVE PHYSICAL DUE IN 1 YEAR. Return in about 6 months (around 10/31/2019).

## 2019-05-01 NOTE — Assessment & Plan Note (Signed)
Under good control on current regimen. Continue current regimen. Continue to monitor. Call with any concerns. Refills given. Labs drawn today.   

## 2019-05-01 NOTE — Assessment & Plan Note (Signed)
Cannot tolerate atorvastatin. Rechecking labs today. Will add if needed. Call with any concerns.

## 2019-05-01 NOTE — Assessment & Plan Note (Signed)
Continue to follow with oncology. Continue to monitor.

## 2019-05-01 NOTE — Assessment & Plan Note (Signed)
Continue plavix. Will keep BP and cholesterol under good control. Continue to monitor.

## 2019-05-01 NOTE — Progress Notes (Signed)
Mary Hoover met with Dr. Baruch Gouty for her cervical cancer follow up. Orders placed for CT scan in 3 months. She will also see Dr. Genevive Bi for lung nodule surgical opinion next week. She has this appointment.

## 2019-05-02 ENCOUNTER — Telehealth: Payer: Self-pay

## 2019-05-02 LAB — LIPID PANEL W/O CHOL/HDL RATIO
Cholesterol, Total: 203 mg/dL — ABNORMAL HIGH (ref 100–199)
HDL: 62 mg/dL (ref >39)
LDL Chol Calc (NIH): 119 mg/dL — ABNORMAL HIGH (ref 0–99)
Triglycerides: 125 mg/dL (ref 0–149)
VLDL Cholesterol Cal: 22 mg/dL (ref 5–40)

## 2019-05-02 LAB — COMPREHENSIVE METABOLIC PANEL
ALT: 8 IU/L (ref 0–32)
AST: 11 IU/L (ref 0–40)
Albumin/Globulin Ratio: 2 (ref 1.2–2.2)
Albumin: 4.3 g/dL (ref 3.7–4.7)
Alkaline Phosphatase: 69 IU/L (ref 39–117)
BUN/Creatinine Ratio: 10 — ABNORMAL LOW (ref 12–28)
BUN: 9 mg/dL (ref 8–27)
Bilirubin Total: 0.3 mg/dL (ref 0.0–1.2)
CO2: 22 mmol/L (ref 20–29)
Calcium: 9.7 mg/dL (ref 8.7–10.3)
Chloride: 100 mmol/L (ref 96–106)
Creatinine, Ser: 0.86 mg/dL (ref 0.57–1.00)
GFR calc Af Amer: 78 mL/min/{1.73_m2} (ref >59)
GFR calc non Af Amer: 68 mL/min/{1.73_m2} (ref >59)
Globulin, Total: 2.2 g/dL (ref 1.5–4.5)
Glucose: 89 mg/dL (ref 65–99)
Potassium: 4.5 mmol/L (ref 3.5–5.2)
Sodium: 134 mmol/L (ref 134–144)
Total Protein: 6.5 g/dL (ref 6.0–8.5)

## 2019-05-02 LAB — CBC WITH DIFFERENTIAL/PLATELET
Basophils Absolute: 0 10*3/uL (ref 0.0–0.2)
Basos: 1 %
EOS (ABSOLUTE): 0.1 10*3/uL (ref 0.0–0.4)
Eos: 1 %
Hematocrit: 32.4 % — ABNORMAL LOW (ref 34.0–46.6)
Hemoglobin: 11 g/dL — ABNORMAL LOW (ref 11.1–15.9)
Immature Grans (Abs): 0 10*3/uL (ref 0.0–0.1)
Immature Granulocytes: 1 %
Lymphocytes Absolute: 0.5 10*3/uL — ABNORMAL LOW (ref 0.7–3.1)
Lymphs: 10 %
MCH: 34.5 pg — ABNORMAL HIGH (ref 26.6–33.0)
MCHC: 34 g/dL (ref 31.5–35.7)
MCV: 102 fL — ABNORMAL HIGH (ref 79–97)
Monocytes Absolute: 0.5 10*3/uL (ref 0.1–0.9)
Monocytes: 10 %
Neutrophils Absolute: 3.8 10*3/uL (ref 1.4–7.0)
Neutrophils: 77 %
Platelets: 321 10*3/uL (ref 150–450)
RBC: 3.19 x10E6/uL — ABNORMAL LOW (ref 3.77–5.28)
RDW: 15.1 % (ref 11.7–15.4)
WBC: 5 10*3/uL (ref 3.4–10.8)

## 2019-05-02 LAB — TSH: TSH: 0.014 u[IU]/mL — ABNORMAL LOW (ref 0.450–4.500)

## 2019-05-02 NOTE — Telephone Encounter (Signed)
Called and notified of CT chest 07/23/19 at 1000 at the [facility]. Read back performed.

## 2019-05-05 ENCOUNTER — Ambulatory Visit: Payer: Medicare Other | Admitting: Cardiothoracic Surgery

## 2019-05-05 ENCOUNTER — Encounter: Payer: Self-pay | Admitting: Cardiothoracic Surgery

## 2019-05-05 ENCOUNTER — Other Ambulatory Visit: Payer: Self-pay

## 2019-05-05 VITALS — BP 173/79 | HR 97 | Temp 97.2°F | Resp 14 | Ht 68.0 in | Wt 131.6 lb

## 2019-05-05 DIAGNOSIS — R911 Solitary pulmonary nodule: Secondary | ICD-10-CM | POA: Diagnosis not present

## 2019-05-05 NOTE — Progress Notes (Signed)
Patient ID: Mary Hoover, female   DOB: 1945-12-06, 74 y.o.   MRN: [id number]  Chief Complaint  Patient presents with  . New Patient (Initial Visit)    lung nodules     Referred By Dr. Tasia Catchings oncology Reason for Referral right upper lobe mass   HPI Location, Quality, Duration, Severity, Timing, Context, Modifying Factors, Associated Signs and Symptoms.  Mary Hoover is a 74 y.o. female.  Her problems began back in December when she was diagnosed with cervical carcinoma.  The treatment for that consisted of radiation therapy and chemotherapy followed by intrauterine radiotherapy.  As part of her initial evaluation back in early December chest CT was performed revealing a 7 mm right upper lobe nodule with a background of severe emphysema.  She then had a repeat chest CT made 4 months later after the completion of her treatment for her cervical carcinoma.  That again revealed a 7 mm nodule in the right upper lobe which was essentially unchanged.  She just recently completed all of her therapy for cervical carcinoma.  She comes in today to review some options.  She continues to smoke and has smoked since she was 74 years of age.  She has tried to cut back recently.  She states that she does get short of breath with exercising.  If she walks a flight of stairs she would be quite winded at the top.  She is also had bilateral carotid endarterectomies performed.  She is currently on Plavix for that.  She has not had any pulmonary function studies.   Past Medical History:  Diagnosis Date  . Carotid atherosclerosis   . Carotid bruit   . COPD (chronic obstructive pulmonary disease) (HCC)    emphysema  . Hyperlipidemia   . Hypertension   . Hyponatremia 02/27/2019  . Hypothyroidism   . Infected cat bite 1980s   was hospitalized  . Tobacco use     Past Surgical History:  Procedure Laterality Date  . CAROTID ENDARTERECTOMY Right Oct. 2015   Dr. Lucky Cowboy  . CAROTID STENOSIS Left April 2016   carotid stenosis surgery  . COLONOSCOPY WITH PROPOFOL N/A 11/08/2016   Procedure: COLONOSCOPY WITH PROPOFOL;  Surgeon: Jonathon Bellows, MD;  Location: [facility] ENDOSCOPY;  Service: Gastroenterology;  Laterality: N/A;  . ESOPHAGOGASTRODUODENOSCOPY (EGD) WITH PROPOFOL N/A 11/08/2016   Procedure: ESOPHAGOGASTRODUODENOSCOPY (EGD) WITH PROPOFOL;  Surgeon: Jonathon Bellows, MD;  Location: [facility] ENDOSCOPY;  Service: Gastroenterology;  Laterality: N/A;  . FLEXIBLE BRONCHOSCOPY N/A 12/17/2014   Procedure: FLEXIBLE BRONCHOSCOPY;  Surgeon: Wilhelmina Mcardle, MD;  Location: [facility] ORS;  Service: Pulmonary;  Laterality: N/A;  . INCISION AND DRAINAGE / EXCISION THYROGLOSSAL CYST  April 2011    Family History  Problem Relation Age of Onset  . Congestive Heart Failure Mother   . Heart disease Mother   . Hypertension Mother   . Hypertension Sister   . Diabetes Sister   . Heart disease Sister   . Cancer Brother        liver, lung  . Heart disease Maternal Grandmother   . Heart disease Maternal Uncle   . Stroke Maternal Grandfather   . Heart disease Brother   . Hypertension Brother   . Heart attack Brother   . COPD Neg Hx     Social History Social History   Tobacco Use  . Smoking status: Current Every Day Smoker    Packs/day: 0.50    Years: 50.00    Pack years: 25.00    Types:  Cigarettes  . Smokeless tobacco: Never Used  . Tobacco comment: cutting back, not ready to quit   Substance Use Topics  . Alcohol use: No    Alcohol/week: 0.0 standard drinks  . Drug use: No    Allergies  Allergen Reactions  . Atorvastatin Other (See Comments)    dizziness    Current Outpatient Medications  Medication Sig Dispense Refill  . ASPIRIN 81 PO Take by mouth daily. 81 mg tablet    . budesonide-formoterol (SYMBICORT) 160-4.5 MCG/ACT inhaler Inhale 2 puffs into the lungs 2 (two) times daily. 1 Inhaler 12  . clopidogrel (PLAVIX) 75 MG tablet Take 1 tablet (75 mg total) by mouth daily. 90 tablet 3  . diphenhydrAMINE  (BENADRYL) 25 mg capsule Take 25 mg by mouth at bedtime as needed.    . ferrous sulfate 325 (65 FE) MG EC tablet Take 1 tablet (325 mg total) by mouth daily with breakfast. 90 tablet 4  . levothyroxine (SYNTHROID) 175 MCG tablet Take 1 tablet (175 mcg total) by mouth daily. 30 tablet 1  . lisinopril (ZESTRIL) 20 MG tablet Take 1 tablet (20 mg total) by mouth daily. 90 tablet 1  . Multiple Vitamin (MULTIVITAMIN) tablet Take 1 tablet by mouth daily.    . prochlorperazine (COMPAZINE) 10 MG tablet Take 1 tablet (10 mg total) by mouth every 6 (six) hours as needed (Nausea or vomiting). 30 tablet 1  . vitamin B-12 (CYANOCOBALAMIN) 500 MCG tablet Take 500 mcg by mouth daily.     No current facility-administered medications for this visit.      Review of Systems A complete review of systems was asked and was negative except for the following positive findings weight loss, loss of sleep, fatigue, cough, shortness of breath, thyroid disease, high cholesterol, incontinence, glasses.  Blood pressure (!) 173/79, pulse 97, temperature (!) 97.2 F (36.2 C), temperature source Oral, resp. rate 14, height _0  (1.727 m), weight 131 lb 9.6 oz (59.7 kg), SpO2 98 %.  Physical Exam CONSTITUTIONAL:  Pleasant, well-developed, well-nourished, and in no acute distress. EYES: Pupils equal and reactive to light, Sclera non-icteric EARS, NOSE, MOUTH AND THROAT:  The oropharynx was clear.  Dentition is absent repair.  Oral mucosa pink and moist. LYMPH NODES:  Lymph nodes in the neck and axillae were normal RESPIRATORY:  Lungs were clear.  Normal respiratory effort without pathologic use of accessory muscles of respiration CARDIOVASCULAR: Heart was regular without murmurs.  There were bilateral carotid bruits more on the left GI: The abdomen was soft, nontender, and nondistended. There were no palpable masses. There was no hepatosplenomegaly. There were normal bowel sounds in all quadrants.  There was an abdominal  bruit present.  This extended into the left femoral artery. GU:  Rectal deferred.   MUSCULOSKELETAL:  Normal muscle strength and tone.  No clubbing or cyanosis.   SKIN:  There were no pathologic skin lesions.  There were no nodules on palpation. NEUROLOGIC:  Sensation is normal.  Cranial nerves are grossly intact. PSYCH:  Oriented to person, place and time.  Mood and affect are normal.  Data Reviewed Chest x-rays CT scans and PET scans  I have personally reviewed the patient's imaging, laboratory findings and medical records.    Assessment    Right upper lobe nodule PET positive in the setting of extensive smoking history    Plan    I reviewed with her the options at this point in time.  There is been no change in the right  upper lobe nodule over the last 4 months.  However her smoking history and the PET scan are suggestive of a slow-growing malignancy.  I explained to her that the options would include surgical resection.  She is reluctant to undergo that without a definitive diagnosis preoperatively.  I explained to her that the nodule was deep in the lung and would not be available to palpation.  The other options at this point would be continued observation and serial scanning or an attempt at navigational bronchoscopy and biopsy.  Additionally she is going to meet with Dr. Donella Stade regarding her cervical carcinoma.  Another option would involve SBRT to the right upper lobe nodule.  I would like to get a complete set of pulmonary function studies.  She would like to review these options with her husband who is unable to make today's appointment.  She will come back to see [place] when she has seen Dr. Donella Stade and discussed her care with her husband.    Nestor Lewandowsky, MD 05/05/2019, 11:07 AM

## 2019-05-05 NOTE — Patient Instructions (Addendum)
Pulmonary Function Test scheduled at [facility] on 05/12/19 @ 2:15 pm.  You will need to have a Covid test  05/09/19 between 8am - 12:30 pm -You will drive up to the front entrance at the [facility].  You must self quantine after having the Covid until the day of your test.   Please see your follow up appointment listed below.

## 2019-05-07 ENCOUNTER — Other Ambulatory Visit: Payer: Self-pay | Admitting: Family Medicine

## 2019-05-07 DIAGNOSIS — E034 Atrophy of thyroid (acquired): Secondary | ICD-10-CM

## 2019-05-07 MED ORDER — LEVOTHYROXINE SODIUM 150 MCG PO TABS: 150.0000 ug | ORAL_TABLET | Freq: Every day | ORAL | 1 refills | Status: DC

## 2019-05-09 ENCOUNTER — Other Ambulatory Visit
Admission: RE | Admit: 2019-05-09 | Discharge: 2019-05-09 | Disposition: A | Discharge status: Home / Self Care | Payer: Medicare Other | Source: Ambulatory Visit | Attending: Cardiothoracic Surgery | Admitting: Cardiothoracic Surgery

## 2019-05-09 DIAGNOSIS — Z20822 Contact with and (suspected) exposure to covid-19: Secondary | ICD-10-CM | POA: Insufficient documentation

## 2019-05-09 DIAGNOSIS — Z01812 Encounter for preprocedural laboratory examination: Secondary | ICD-10-CM | POA: Insufficient documentation

## 2019-05-09 LAB — SARS CORONAVIRUS 2 (TAT 6-24 HRS): SARS Coronavirus 2: NEGATIVE

## 2019-05-10 ENCOUNTER — Other Ambulatory Visit: Payer: Self-pay | Admitting: Family Medicine

## 2019-05-12 ENCOUNTER — Other Ambulatory Visit: Payer: Self-pay

## 2019-05-12 ENCOUNTER — Ambulatory Visit: Payer: Medicare Other | Attending: Cardiothoracic Surgery

## 2019-05-12 DIAGNOSIS — J984 Other disorders of lung: Secondary | ICD-10-CM | POA: Insufficient documentation

## 2019-05-12 DIAGNOSIS — R911 Solitary pulmonary nodule: Secondary | ICD-10-CM | POA: Diagnosis not present

## 2019-05-12 MED ORDER — ALBUTEROL SULFATE (2.5 MG/3ML) 0.083% IN NEBU
2.5000 mg | INHALATION_SOLUTION | Freq: Once | RESPIRATORY_TRACT | Status: AC
  Administered 2019-05-12: 2.5 mg via RESPIRATORY_TRACT
  Filled 2019-05-12: qty 3

## 2019-05-16 ENCOUNTER — Other Ambulatory Visit: Payer: Self-pay

## 2019-05-16 ENCOUNTER — Encounter: Payer: Self-pay | Admitting: Cardiothoracic Surgery

## 2019-05-16 ENCOUNTER — Ambulatory Visit (INDEPENDENT_AMBULATORY_CARE_PROVIDER_SITE_OTHER): Payer: Medicare Other | Admitting: Cardiothoracic Surgery

## 2019-05-16 VITALS — BP 149/66 | HR 90 | Temp 97.6°F | Resp 12 | Ht 67.0 in | Wt 130.6 lb

## 2019-05-16 DIAGNOSIS — R911 Solitary pulmonary nodule: Secondary | ICD-10-CM | POA: Diagnosis not present

## 2019-05-16 NOTE — Patient Instructions (Addendum)
Dr.Oaks discussed PFT results with patient at today's visit.   Dr.Oaks discussed with patient the different options of treatment: 1. Repeat CT Chest in three months. 2. Patient could have a Lung Biopsy. 3. Patient could have a Bronchoscopy procedure. 4. Patient could have surgery and having a part of the Lung removed.    Patient to see Dr Baruch Gouty in June after her repeat CT scan.

## 2019-05-16 NOTE — Progress Notes (Signed)
[facility] Hoover Inpatient Post-Op Note  Patient ID: Mary Hoover, female   DOB: 1945/09/04, 74 y.o.   MRN: [id number]  HISTORY: She returns today in follow-up.  She has no new complaints.  She did have her pulmonary function studies completed.  Those reveal an FEV1 and DLCO of approximately 60 to 70%.    Vitals:   05/16/19 0919  BP: (!) 149/66  Pulse: 90  Resp: 12  Temp: 97.6 F (36.4 C)  SpO2: 100%     EXAM: As below            Resp: Lungs are clear bilaterally.  No respiratory distress, normal effort. Heart:  Regular without murmurs Abd:  Abdomen is soft, non distended and non tender. No masses are palpable.  There is no rebound and no guarding.  Neurological: Alert and oriented to person, place, and time. Coordination normal.  Skin: Skin is warm and dry. No rash noted. No diaphoretic. No erythema. No pallor.  Psychiatric: Normal mood and affect. Normal behavior. Judgment and thought content normal.      ASSESSMENT: I have independently reviewed her pulmonary function tests.  As expected they do show moderate obstruction.   PLAN:   I had a long discussion with her today regarding the options.  This would include continued surveillance, biopsy via transthoracic or endobronchial approach or surgical resection.  She would like to think about her options and discuss that with Dr. Donella Stade when she sees him at the end of June.  She has a CT scan scheduled at that time.  I did not make return visit for her as I would be happy to see her should the need arise.  I reviewed with her in detail the indications and risks of all the various options including the advantages and disadvantages.  She understands and would like to proceed with continued surveillance.    Nestor Lewandowsky, MD

## 2019-06-17 ENCOUNTER — Other Ambulatory Visit: Payer: Medicare Other

## 2019-06-17 ENCOUNTER — Other Ambulatory Visit: Payer: Self-pay

## 2019-06-17 DIAGNOSIS — E034 Atrophy of thyroid (acquired): Secondary | ICD-10-CM

## 2019-06-18 LAB — TSH: TSH: 0.016 u[IU]/mL — ABNORMAL LOW (ref 0.450–4.500)

## 2019-06-19 ENCOUNTER — Telehealth: Payer: Self-pay

## 2019-06-19 NOTE — Telephone Encounter (Signed)
Copied from [place] [id number]. Topic: General - Other >> Jun 19, 2019 12:38 PM Marya Landry D wrote: Reason for CRM: Patient would like to speak with someone regarding her recent labs.  She also stated if the results show that her thyroid levels are high she would need a refill on her prescription, she wanted to make mention of that.please advise

## 2019-06-22 ENCOUNTER — Other Ambulatory Visit: Payer: Self-pay | Admitting: Family Medicine

## 2019-06-22 DIAGNOSIS — E034 Atrophy of thyroid (acquired): Secondary | ICD-10-CM

## 2019-06-22 MED ORDER — LEVOTHYROXINE SODIUM 100 MCG PO TABS: 100.0000 ug | ORAL_TABLET | Freq: Every day | ORAL | 1 refills | Status: DC

## 2019-06-25 ENCOUNTER — Other Ambulatory Visit: Payer: Self-pay

## 2019-06-25 ENCOUNTER — Encounter: Payer: Self-pay | Admitting: Oncology

## 2019-06-25 ENCOUNTER — Inpatient Hospital Stay: Payer: Medicare Other | Attending: Oncology | Admitting: Oncology

## 2019-06-25 VITALS — BP 154/74 | HR 92 | Temp 97.4°F | Resp 18 | Wt 129.4 lb

## 2019-06-25 DIAGNOSIS — C539 Malignant neoplasm of cervix uteri, unspecified: Secondary | ICD-10-CM | POA: Insufficient documentation

## 2019-06-25 DIAGNOSIS — Z9221 Personal history of antineoplastic chemotherapy: Secondary | ICD-10-CM | POA: Insufficient documentation

## 2019-06-25 DIAGNOSIS — C538 Malignant neoplasm of overlapping sites of cervix uteri: Secondary | ICD-10-CM | POA: Diagnosis not present

## 2019-06-25 DIAGNOSIS — R918 Other nonspecific abnormal finding of lung field: Secondary | ICD-10-CM | POA: Diagnosis not present

## 2019-06-25 DIAGNOSIS — Z923 Personal history of irradiation: Secondary | ICD-10-CM | POA: Diagnosis not present

## 2019-06-25 NOTE — Progress Notes (Signed)
Hematology/Oncology note [facility] [phone] [phone]   Patient Care Team: [name], DO as PCP - General (Family Medicine) Clent Jacks, RN as Oncology Nurse Navigator Noreene Filbert, MD as Radiation Oncologist (Radiation Oncology)  REFERRING PROVIDER: [name], DO  CHIEF COMPLAINTS/REASON FOR VISIT:  Follow up cervical cancer  HISTORY OF PRESENTING ILLNESS:  Mary Hoover is a  74 y.o.  female with PMH listed below was seen in consultation at the request of  [name], DO  for evaluation of cervical cancer Patient has developed postmenopausal vaginal bleeding and discharge.  She was noted to have a friable cervix/2 cm mass of the cervix, concerning for malignancy 12/19/2018 endometrial biopsy showed scattered atypical squamous cells suspicious for malignancy.  Predominantly necrosis with associated inflammation. 01/01/2019 initial cervix biopsy showed at least high-grade squamous intraepithelial lesion.-HPV negative 01/22/2019, repeat vaginal wall biopsy and cervix 9:00 biopsy showed squamous cell carcinoma.    Staging images 12/31/2018 showed fluid distending the endometrial canal and or endometrial thickening to the level of cervix.  No evidence of pathological lymphadenopathy.  Haziness about the lower cervix.  7 mm irregular pulmonary nodule within the right upper lobe, suspicious for possible primary or metastatic malignancy.  Chronic pleural-parenchymal scarring/fibrosis at the lung apices.  Large amount of stool.  No bowel obstruction. 01/08/2019 PET scan showed marked hypermetabolism in the region of the cervix, compatible with the reported history of cervical cancer. Small bilateral pelvic sidewall lymph nodes discernible FDG accumulation, concerning for metastatic disease although neither lymph node is enlarged by CT size criteria. 7 mm nodule in the right upper lobe shows discernible FDG accumulation.   Questionable neoplasm, primary versus metastatic. Emphysema.   #Chronic hearing loss # Lung nodule, FDG avid, questionable primary bronchogenic carcinoma versus metastatic cancer. Discussed with radiation oncology.  Her case was also discussed on tumor board.  Consensus reached on finishing concurrent chemoradiation treatments for cervix cancer first. Possible SBRT to lung nodule for presumed primary lung cancer.  # Feb 2021 finished chemotherapy and radiation.  #   INTERVAL HISTORY [name] is a 74 y.o. female who has above history reviewed by me today presents for follow up visit for management of low  advanced cervix cancer Problems and complaints are listed below: Patient denies any new complaints. She was evaluated by Dr.Oaks. Options of biopsy via transthoracic or endobronchial approach vs surgical resection.  Patient wants to further discuss with Radonc.  She has a repeat CT scan ordered to be done prior to her appointment with Dr.Chrystal in June 2021.    She has no new complaints.  Denies fever, chills, nausea, vomiting, diarrhea, chest pain, shortness of breath, abdominal pain, urinary symptoms, lower extremity swelling.     Review of Systems  Constitutional: Negative for appetite change, chills, fatigue and fever.  HENT:   Positive for hearing loss. Negative for voice change.   Eyes: Negative for eye problems.  Respiratory: Negative for chest tightness and cough.   Cardiovascular: Negative for chest pain.  Gastrointestinal: Negative for abdominal distention, abdominal pain and blood in stool.  Endocrine: Negative for hot flashes.  Genitourinary: Negative for difficulty urinating and frequency.   Musculoskeletal: Negative for arthralgias.  Skin: Negative for itching and rash.  Neurological: Negative for extremity weakness.  Hematological: Negative for adenopathy.  Psychiatric/Behavioral: Negative for confusion.    MEDICAL HISTORY:  Past Medical History:    Diagnosis Date  . Carotid atherosclerosis   . Carotid  bruit   . COPD (chronic obstructive pulmonary disease) (HCC)    emphysema  . Hyperlipidemia   . Hypertension   . Hyponatremia 02/27/2019  . Hypothyroidism   . Infected cat bite 1980s   was hospitalized  . Tobacco use     SURGICAL HISTORY: Past Surgical History:  Procedure Laterality Date  . CAROTID ENDARTERECTOMY Right Oct. 2015   Dr. Lucky Cowboy  . CAROTID STENOSIS Left April 2016   carotid stenosis surgery  . COLONOSCOPY WITH PROPOFOL N/A 11/08/2016   Procedure: COLONOSCOPY WITH PROPOFOL;  Surgeon: Jonathon Bellows, MD;  Location[facility] ENDOSCOPY;  Service: Gastroenterology;  Laterality: N/A;  . ESOPHAGOGASTRODUODENOSCOPY (EGD) WITH PROPOFOL N/A 11/08/2016   Procedure: ESOPHAGOGASTRODUODENOSCOPY (EGD) WITH PROPOFOL;  Surgeon: Jonathon Bellows, MD;  Location: [facility] ENDOSCOPY;  Service: Gastroenterology;  Laterality: N/A;  . FLEXIBLE BRONCHOSCOPY N/A 12/17/2014   Procedure: FLEXIBLE BRONCHOSCOPY;  Surgeon: Wilhelmina Mcardle, MD;  Location: [facility] ORS;  Service: Pulmonary;  Laterality: N/A;  . INCISION AND DRAINAGE / EXCISION THYROGLOSSAL CYST  April 2011    SOCIAL HISTORY: Social History   Socioeconomic History  . Marital status: Married    Spouse name: Not on file  . Number of children: Not on file  . Years of education: Not on file  . Highest education level: 9th grade  Occupational History  . Occupation: retired   Tobacco Use  . Smoking status: Current Every Day Smoker    Packs/day: 0.50    Years: 50.00    Pack years: 25.00    Types: Cigarettes  . Smokeless tobacco: Never Used  . Tobacco comment: cutting back, not ready to quit   Substance and Sexual Activity  . Alcohol use: No    Alcohol/week: 0.0 standard drinks  . Drug use: No  . Sexual activity: Yes  Other Topics Concern  . Not on file  Social History Narrative   Lives at home with husband   Social Determinants of Health   Financial Resource Strain:   . Difficulty of  Paying Living Expenses:   Food Insecurity:   . Worried About Charity fundraiser in the Last Year:   . Arboriculturist in the Last Year:   Transportation Needs:   . Film/video editor (Medical):   Marland Kitchen Lack of Transportation (Non-Medical):   Physical Activity:   . Days of Exercise per Week:   . Minutes of Exercise per Session:   Stress:   . Feeling of Stress :   Social Connections:   . Frequency of Communication with Friends and Family:   . Frequency of Social Gatherings with Friends and Family:   . Attends Religious Services:   . Active Member of Clubs or Organizations:   . Attends Archivist Meetings:   Marland Kitchen Marital Status:   Intimate Partner Violence:   . Fear of Current or Ex-Partner:   . Emotionally Abused:   Marland Kitchen Physically Abused:   . Sexually Abused:     FAMILY HISTORY: Family History  Problem Relation Age of Onset  . Congestive Heart Failure Mother   . Heart disease Mother   . Hypertension Mother   . Hypertension Sister   . Diabetes Sister   . Heart disease Sister   . Cancer Brother        liver, lung  . Heart disease Maternal Grandmother   . Heart disease Maternal Uncle   . Stroke Maternal Grandfather   . Heart disease Brother   . Hypertension Brother   . Heart  attack Brother   . COPD Neg Hx     ALLERGIES:  is allergic to atorvastatin.  MEDICATIONS:  Current Outpatient Medications  Medication Sig Dispense Refill  . ASPIRIN 81 PO Take by mouth daily. 81 mg tablet    . budesonide-formoterol (SYMBICORT) 160-4.5 MCG/ACT inhaler Inhale 2 puffs into the lungs 2 (two) times daily. 1 Inhaler 12  . clopidogrel (PLAVIX) 75 MG tablet Take 1 tablet (75 mg total) by mouth daily. 90 tablet 3  . ferrous sulfate 325 (65 FE) MG EC tablet Take 1 tablet (325 mg total) by mouth daily with breakfast. 90 tablet 4  . levothyroxine (SYNTHROID) 100 MCG tablet Take 1 tablet (100 mcg total) by mouth daily. 30 tablet 1  . lisinopril (ZESTRIL) 20 MG tablet Take 1 tablet (20  mg total) by mouth daily. 90 tablet 1  . Multiple Vitamin (MULTIVITAMIN) tablet Take 1 tablet by mouth daily.    . vitamin B-12 (CYANOCOBALAMIN) 500 MCG tablet Take 500 mcg by mouth daily.    . diphenhydrAMINE (BENADRYL) 25 mg capsule Take 25 mg by mouth at bedtime as needed.    . prochlorperazine (COMPAZINE) 10 MG tablet Take 1 tablet (10 mg total) by mouth every 6 (six) hours as needed (Nausea or vomiting). (Patient not taking: Reported on 06/25/2019) 30 tablet 1   No current facility-administered medications for this visit.     PHYSICAL EXAMINATION: ECOG PERFORMANCE STATUS: 1 - Symptomatic but completely ambulatory Vitals:   06/25/19 1353  BP: (!) 154/74  Pulse: 92  Resp: 18  Temp: (!) 97.4 F (36.3 C)   Filed Weights   06/25/19 1353  Weight: 129 lb 6.4 oz (58.7 kg)    Physical Exam Constitutional:      General: She is not in acute distress. HENT:     Head: Normocephalic and atraumatic.  Eyes:     General: No scleral icterus.    Pupils: Pupils are equal, round, and reactive to light.  Cardiovascular:     Rate and Rhythm: Normal rate and regular rhythm.     Heart sounds: Normal heart sounds.  Pulmonary:     Effort: Pulmonary effort is normal. No respiratory distress.     Breath sounds: No wheezing.     Comments: Decreased breath sound bilaterally  Abdominal:     General: Bowel sounds are normal. There is no distension.     Palpations: Abdomen is soft. There is no mass.     Tenderness: There is no abdominal tenderness.  Musculoskeletal:        General: No deformity. Normal range of motion.     Cervical back: Normal range of motion and neck supple.  Skin:    General: Skin is warm and dry.     Findings: No erythema or rash.  Neurological:     Mental Status: She is alert and oriented to person, place, and time. Mental status is at baseline.     Cranial Nerves: No cranial nerve deficit.     Coordination: Coordination normal.  Psychiatric:        Mood and Affect:  Mood normal.     LABORATORY DATA:  I have reviewed the data as listed Lab Results  Component Value Date   WBC 5.0 05/01/2019   HGB 11.0 (L) 05/01/2019   HCT 32.4 (L) 05/01/2019   MCV 102 (H) 05/01/2019   PLT 321 05/01/2019   Recent Labs    02/20/19 0817 02/27/19 0810 03/13/19 0837 03/20/19 1025 05/01/19 1358  NA 130*   < >  132* 135 134  K 4.5   < > 5.1 4.2 4.5  CL 97*   < > 101 104 100  CO2 23   < > _0 GLUCOSE 103*   < > 95 102* 89  BUN 13   < > _1 CREATININE 0.81   < > 0.78 0.89 0.86  CALCIUM 9.2   < > 8.9 9.0 9.7  GFRNONAA >60   < > >60 >60 68  GFRAA >60   < > >60 >60 78  PROT 7.2   < > 6.9 7.1 6.5  ALBUMIN 4.0   < > 3.8 3.9 4.3  AST 16   < > _2 ALT 11   < > _3 ALKPHOS 66   < > 55 58 69  BILITOT 0.5   < > 0.6 0.5 0.3  BILIDIR <0.1  --   --   --   --   IBILI NOT CALCULATED  --   --   --   --    < > = values in this interval not displayed.   Iron/TIBC/Ferritin/ %Sat    Component Value Date/Time   IRON 55 10/31/2016 1546   TIBC 339 10/31/2016 1546   FERRITIN 18 10/31/2016 1546   IRONPCTSAT 16 10/31/2016 1546      RADIOGRAPHIC STUDIES: I have personally reviewed the radiological images as listed and agreed with the findings in the report. CT Chest Wo Contrast  Result Date: 04/22/2019 CLINICAL DATA:  Follow-up lung nodule. EXAM: CT CHEST WITHOUT CONTRAST TECHNIQUE: Multidetector CT imaging of the chest was performed following the standard protocol without IV contrast. COMPARISON:  12/31/18 FINDINGS: Cardiovascular: Heart size appears within normal limits. Aortic atherosclerosis. Three vessel coronary artery atherosclerotic calcifications. Mediastinum/Nodes: No enlarged mediastinal or axillary lymph nodes. Thyroid gland, trachea, and esophagus demonstrate no significant findings. Lungs/Pleura: Moderate to severe centrilobular emphysema. No pleural effusion, pulmonary edema or airspace consolidation. The right upper lobe lung nodule measures  7 mm, image 49/3. This is compared with the same previously. Calcified granuloma is noted in the right base. No new lung nodules. Upper Abdomen: No acute abnormality. Aortic atherosclerosis with branch vessel disease noted. Musculoskeletal: No chest wall mass or suspicious bone lesions identified. IMPRESSION: 1. No acute cardiopulmonary abnormalities. 2. Stable right upper lobe lung nodule measuring 7 mm. As mentioned previously given the discernible uptake on recent PET-CT in a tiny lung nodule of this size, neoplasm is a distinct consideration. Primary bronchogenic carcinoma or metastatic disease could have this appearance. Recommend continued serial follow-up imaging to ensure stability of this indeterminate nodule. 3. Emphysema and aortic atherosclerosis. 4. Three vessel coronary artery atherosclerotic calcifications. Aortic Atherosclerosis (ICD10-I70.0) and Emphysema (ICD10-J43.9). Electronically Signed   By: Kerby Moors M.D.   On: 04/22/2019 11:32   Pulmonary Function Test [facility] Only  Result Date: 05/13/2019 Spirometry Data Is Acceptable and Reproducible Moderate RESTRICTIVE LUNG Disease without Significant Broncho-Dilator Response Consider outpatient Pulmonary Consultation if needed Clinical Correlation Advised      ASSESSMENT & PLAN:  1. Malignant neoplasm of overlapping sites of cervix ([place])   2. Lung nodules    #Locally advanced cervical cancer stage IIIC1 with pelvic nodes involvement. S/p concurrent chemotherapy and radiation followed by brachytherapy.  Recommend her to continue followup with Gynonc for pelvis examination.  She will rotate her follow up among gynonc, radonc and oncology. Discussed with RN navigator Kristi and [place].    # Persistent right lung nodule  4m.  She has been evaluated by Dr.Oaks.  She is not decided whether she would want to proceed with surgical resection.  She will repeat another CT scan and talk to Dr.Chrystal for evaluation of SBRT.  I  discussed with her about pros and cons of surgical resection and SBRT.   All questions were answered. The patient knows to call the clinic with any problems questions or concerns.   Return of visit: to be determined.    ZEarlie Server MD, PhD Hematology Oncology CSunset Surgical Centre LLCat AIndiana Endoscopy Centers LLCPager- [phone]/26/2021

## 2019-06-25 NOTE — Progress Notes (Signed)
Patient denies new problems/concerns today.   °

## 2019-07-21 ENCOUNTER — Other Ambulatory Visit: Payer: Self-pay | Admitting: Family Medicine

## 2019-07-21 MED ORDER — LEVOTHYROXINE SODIUM 100 MCG PO TABS: 100.0000 ug | ORAL_TABLET | Freq: Every day | ORAL | 0 refills | Status: DC

## 2019-07-21 NOTE — Telephone Encounter (Signed)
Patient would like a short supply levothyroxine (SYNTHROID) 100 MCG tablet  To hold her over until lab appointment scheduled for 08/05/2019. Informed patient please allow 48 to 72 hour turn around time. Patient states she has only 3 pills   [place] Phone:  [phone]  Fax:  [phone]

## 2019-07-21 NOTE — Telephone Encounter (Signed)
Requested Prescriptions  Pending Prescriptions Disp Refills  . levothyroxine (SYNTHROID) 100 MCG tablet 30 tablet 1    Sig: Take 1 tablet (100 mcg total) by mouth daily.     Endocrinology:  Hypothyroid Agents Failed - 07/21/2019 10:27 AM      Failed - TSH needs to be rechecked within 3 months after an abnormal result. Refill until TSH is due.      Failed - TSH in normal range and within 360 days    TSH  Date Value Ref Range Status  06/17/2019 0.016 (L) 0.450 - 4.500 uIU/mL Final         Passed - Valid encounter within last 12 months    Recent Outpatient Visits          2 months ago Routine general medical examination at a health care facility   [facility] [place] P, DO   6 months ago Acquired hypothyroidism   [place], [place] P, DO   7 months ago BV (bacterial vaginosis)   [place], Megan P, DO   7 months ago Hypothyroidism due to acquired atrophy of thyroid   [place], [place], DO   1 year ago Routine general medical examination at a health care facility   [place], Barb Merino, DO      Future Appointments            In 3 months Wynetta Emery, Barb Merino, DO [facility]

## 2019-07-23 ENCOUNTER — Other Ambulatory Visit: Payer: Self-pay

## 2019-07-23 ENCOUNTER — Ambulatory Visit
Admission: RE | Admit: 2019-07-23 | Discharge: 2019-07-23 | Disposition: A | Discharge status: Home / Self Care | Payer: Medicare Other | Source: Ambulatory Visit | Attending: Oncology | Admitting: Oncology

## 2019-07-23 DIAGNOSIS — C539 Malignant neoplasm of cervix uteri, unspecified: Secondary | ICD-10-CM | POA: Insufficient documentation

## 2019-07-23 DIAGNOSIS — R918 Other nonspecific abnormal finding of lung field: Secondary | ICD-10-CM | POA: Diagnosis not present

## 2019-07-23 DIAGNOSIS — J439 Emphysema, unspecified: Secondary | ICD-10-CM | POA: Diagnosis not present

## 2019-07-23 DIAGNOSIS — R911 Solitary pulmonary nodule: Secondary | ICD-10-CM | POA: Diagnosis not present

## 2019-07-25 ENCOUNTER — Telehealth: Payer: Self-pay

## 2019-07-25 NOTE — Telephone Encounter (Signed)
Called results of CT chest to Mary Hoover. She will keep her upcoming appointment on 6/30 with Dr. Baruch Gouty to discuss treatment options/plan.  Spiculated nodule in the posterior segment right upper lobe measures 6 x 8 mm (3/58) and is likely stable from 04/22/2019 but minimally increased in size from 12/31/2018, at which time it measured 5 x 7 mm.   Spiculated right upper lobe nodule shows slight growth from 12/31/2018 and is new from 02/02/2015. Together with visualization on PET 01/08/2019, findings are worrisome for adenocarcinoma.

## 2019-07-29 NOTE — Progress Notes (Signed)
[place]  [phone] [phone]  Patient Care Team: [name], DO as PCP - General (Family Medicine) Clent Jacks, RN as Oncology Nurse Navigator Noreene Filbert, MD as Radiation Oncologist (Radiation Oncology)   Name of the patient: Mary Hoover  [id number]  07-16-1945   Date of visit: 07/30/2019  Gynecologic Oncology Interval Visit   Referring Provider: Dr. Livingston Diones Ob-Gyn  Chief Complaint: Stage IIIC squamous Cell Cervical Cancer, surveillance  Subjective:  Mary Hoover is a 74 y.o. female initially diagnosed with stage IIIc cervical squamous cell carcinoma s/p concurrent carboplatin (02/20/19-03/20/19) & radiation (02/17/2019- 03/24/19) followed by vaginal brachytherapy at [place] completed 04/11/2019 who returns to clinic for follow-up and continued surveillance.  No gyn complaints today. Follow up CT scan of chest last week for surveillance of small nodule.    06/23/202 CT Chest  IMPRESSION: 1. Spiculated right upper lobe nodule shows slight growth from 12/31/2018 and is new from 02/02/2015. Together with visualization on PET 01/08/2019, findings are worrisome for adenocarcinoma. 2. Aortic atherosclerosis (ICD10-I70.0). Coronary artery calcification. 3.  Emphysema (ICD10-J43.9).  Saw Dr Faith Rogue 4/21 and he discussed options including surveillance vs resection. She is reluctant to have surgery because half a lung would have to be removed and she is already having significant dyspnea due to COPD.  Has reduced smoking to half PPD. Dr Baruch Gouty suggests another CT in 3 months and if further growth he can treat this small area with radiation.  Gynecologic Oncology History  Mary Hoover is a pleasant y.o. female who is seen in consultation from Dr. Wynetta Emery for new diagnosis of probable cervical cancer. Patient initially presented to her PCP with postmenopausal vaginal bleeding.  She was referred to  Dr. Glennon Mac at [facility].  Last menstrual period around age 30.  On exam, cervix was friable and firm to palpation extending anteriorly.  Urethral caruncle also noted.  Speculum exam caused some bleeding that required silver nitrate.  Pap and endometrial biopsy were performed.  Endometrial biopsy returned some exudative material.  Findings are concerning for neoplastic process.  Pap- 12/19/2018 - Squamous cell Carcinoma - High Risk HPV - negative  12/19/2018- Endometrial biopsy - Scattered atypical squamous cells suspicious for malignancy  - Predominantly necrosis with associated inflammation   CT Chest/Abdomen/Pelvis on 12/31/2018 for staging: Fluid distending the endometrial canal and/or endometrial thickening to the level of the cervix.  No adnexal mass or free fluid seen.  Haziness about the cervix of uncertain significance.  Chronic pleuroparenchymal scarring/fibrosis at lung apices, irregular pulmonary nodule within the Right upper lobe measuring 52m, moderate emphysema.   01/08/2019 Cervical biopsy. DIAGNOSIS:  A. CERVIX; BIOPSY:  - AT LEAST HIGH-GRADE SQUAMOUS INTRAEPITHELIAL LESION (HSIL/CIN3).  Comment: Invasion is not definitively identified in this sample. Numerous step sections are examined. Given the exophytic growth pattern of the lesion, an underlying invasive component cannot be excluded. The diagnosis of squamous cell carcinoma on recent pap smear is noted. Immunohistochemical stain for p16 is negative.   01/08/2019 PET IMPRESSION:1. Marked hypermetabolism in the region of the cervix, compatible with the reported history of cervical cancer. 2. Small bilateral pelvic sidewall lymph nodes show discernible FDG accumulation, concerning for metastatic disease although neither lymph node is enlarged by CT size criteria. 3. 7 mm nodule in the right upper lobe shows discernible FDG accumulation. Given the discernible uptake in a tiny lung nodule of this size, neoplasm is a distinct  consideration. Primary bronchogenic carcinoma or metastatic disease could  have this appearance. 4.  Aortic Atherosclerois (ICD10-170.0) 5.  Emphysema. (NKN39-J67.9)  Stage IIIc cervical squamous cell carcinoma s/p concurrent carboplatin (02/20/19-03/20/19) & radiation (02/17/2019- 03/24/19) followed by vaginal brachytherapy at [place] completed 04/11/2019 who returns to clinic for follow-up.   04/22/2019- CT Chest WO Contrast revealed stable right upper lobe lung nodule measuring 70m, previously given the discernible uptake on PET from 01/08/2019, in a tiny lung nodule of this size, neoplasm is a distinct consideration. Primary bronchogenic carcinoma or metastatic disease could have this appearance. Recommend continued serial follow up to ensure stability.   Colonoscopy: 2018 Mammogram:  Also s/p carotid end arterectomy for carotid stenosis.  Oncology History  [place]ervical cancer ([facility]  02/03/2019 Initial Diagnosis   Malignant neoplasm of cervix ([facility]   02/20/2019 -  Chemotherapy   The patient had palonosetron (ALOXI) injection 0.25 mg, 0.25 mg, Intravenous,  Once, 5 of 5 cycles Administration: 0.25 mg (02/20/2019), 0.25 mg (02/27/2019), 0.25 mg (03/06/2019), 0.25 mg (03/13/2019), 0.25 mg (03/20/2019) CARBOplatin (PARAPLATIN) 150 mg in sodium chloride 0.9 % 100 mL chemo infusion, 150 mg (100 % of original dose 152.8 mg), Intravenous,  Once, 5 of 5 cycles Dose modification:   (original dose 152.8 mg, Cycle 1) Administration: 150 mg (02/20/2019), 150 mg (02/27/2019), 150 mg (03/06/2019), 150 mg (03/13/2019), 150 mg (03/20/2019)  for chemotherapy treatment.     Problem List: Patient Active Problem List   Diagnosis Date Noted  . Encounter for antineoplastic chemotherapy 03/13/2019  . Hyponatremia 02/27/2019  . Cervical cancer ([facility] 02/03/2019  . Goals of care, counseling/discussion 02/03/2019  . Carotid atherosclerosis 11/14/2016  . Myopia of both eyes 01/11/2016  . Lung nodules   . Essential hypertension  12/12/2014  . Hemoptysis 12/11/2014  . Medication monitoring encounter 11/25/2014  . Hyperlipidemia   . Status post carotid endarterectomy   . Tobacco use   . COPD (chronic obstructive pulmonary disease) ([facility] [place] 10/13/2013  . Hypothyroidism 09/15/2013  . Peripheral vascular disease ([place] 09/15/2013   Past Medical History: Past Medical History:  Diagnosis Date  . Carotid atherosclerosis   . Carotid bruit   . COPD (chronic obstructive pulmonary disease) (HCC)    emphysema  . Hyperlipidemia   . Hypertension   . Hyponatremia 02/27/2019  . Hypothyroidism   . Infected cat bite 1980s   was hospitalized  . Tobacco use    Past Surgical History: Past Surgical History:  Procedure Laterality Date  . CAROTID ENDARTERECTOMY Right Oct. 2015   Dr. DLucky Cowboy . CAROTID STENOSIS Left April 2016   carotid stenosis surgery  . COLONOSCOPY WITH PROPOFOL N/A 11/08/2016   Procedure: COLONOSCOPY WITH PROPOFOL;  Surgeon: AJonathon Bellows MD;  [facility];  Service: Gastroenterology;  Laterality: N/A;  . ESOPHAGOGASTRODUODENOSCOPY (EGD) WITH PROPOFOL N/A 11/08/2016   Procedure: ESOPHAGOGASTRODUODENOSCOPY (EGD) WITH PROPOFOL;  Surgeon: AJonathon Bellows MD;  [facility];  Service: Gastroenterology;  Laterality: N/A;  . FLEXIBLE BRONCHOSCOPY N/A 12/17/2014   Procedure: FLEXIBLE BRONCHOSCOPY;  Surgeon: DWilhelmina Mcardle MD;  Location: [facility] ORS;  Service: Pulmonary;  Laterality: N/A;  . INCISION AND DRAINAGE / EXCISION THYROGLOSSAL CYST  April 2011   OB/GYN History:  She underwent menopause many years ago, no HRT.  Had 5 children. Not sexually active, husband had prostate cancer.    Family History: Family History  Problem Relation Age of Onset  . Congestive Heart Failure Mother   . Heart disease Mother   . Hypertension Mother   . Hypertension Sister   . Diabetes Sister   .  Heart disease Sister   . Cancer Brother        liver, lung  . Heart disease Maternal Grandmother   . Heart disease  Maternal Uncle   . Stroke Maternal Grandfather   . Heart disease Brother   . Hypertension Brother   . Heart attack Brother   . COPD Neg Hx     Social History: Social History   Socioeconomic History  . Marital status: Married    Spouse name: Not on file  . Number of children: Not on file  . Years of education: Not on file  . Highest education level: 9th grade  Occupational History  . Occupation: retired   Tobacco Use  . Smoking status: Current Every Day Smoker    Packs/day: 0.50    Years: 50.00    Pack years: 25.00    Types: Cigarettes  . Smokeless tobacco: Never Used  . Tobacco comment: cutting back, not ready to quit   Vaping Use  . Vaping Use: Former  Substance and Sexual Activity  . Alcohol use: No    Alcohol/week: 0.0 standard drinks  . Drug use: No  . Sexual activity: Yes  Other Topics Concern  . Not on file  Social History Narrative   Lives at home with husband   Social Determinants of Health   Financial Resource Strain:   . Difficulty of Paying Living Expenses:   Food Insecurity:   . Worried About Charity fundraiser in the Last Year:   . Arboriculturist in the Last Year:   Transportation Needs:   . Film/video editor (Medical):   Marland Kitchen Lack of Transportation (Non-Medical):   Physical Activity:   . Days of Exercise per Week:   . Minutes of Exercise per Session:   Stress:   . Feeling of Stress :   Social Connections:   . Frequency of Communication with Friends and Family:   . Frequency of Social Gatherings with Friends and Family:   . Attends Religious Services:   . Active Member of Clubs or Organizations:   . Attends Archivist Meetings:   Marland Kitchen Marital Status:   Intimate Partner Violence:   . Fear of Current or Ex-Partner:   . Emotionally Abused:   Marland Kitchen Physically Abused:   . Sexually Abused:    Allergies: Allergies  Allergen Reactions  . Atorvastatin Other (See Comments)    dizziness   Current Medications: Current Outpatient  Medications  Medication Sig Dispense Refill  . ASPIRIN 81 PO Take by mouth daily. 81 mg tablet    . budesonide-formoterol (SYMBICORT) 160-4.5 MCG/ACT inhaler Inhale 2 puffs into the lungs 2 (two) times daily. 1 Inhaler 12  . clopidogrel (PLAVIX) 75 MG tablet Take 1 tablet (75 mg total) by mouth daily. 90 tablet 3  . diphenhydrAMINE (BENADRYL) 25 mg capsule Take 25 mg by mouth at bedtime as needed.    . ferrous sulfate 325 (65 FE) MG EC tablet Take 1 tablet (325 mg total) by mouth daily with breakfast. 90 tablet 4  . levothyroxine (SYNTHROID) 100 MCG tablet Take 1 tablet (100 mcg total) by mouth daily. 30 tablet 0  . lisinopril (ZESTRIL) 20 MG tablet Take 1 tablet (20 mg total) by mouth daily. 90 tablet 1  . Multiple Vitamin (MULTIVITAMIN) tablet Take 1 tablet by mouth daily.    . prochlorperazine (COMPAZINE) 10 MG tablet Take 1 tablet (10 mg total) by mouth every 6 (six) hours as needed (Nausea or vomiting). (Patient not  taking: Reported on 06/25/2019) 30 tablet 1  . vitamin B-12 (CYANOCOBALAMIN) 500 MCG tablet Take 500 mcg by mouth daily.     No current facility-administered medications for this visit.   Review of Systems  Objective:  Physical Examination:  Vitals:   07/30/19 1348 07/30/19 1351  BP: (!) 156/80   Pulse: 85   Resp: 12   Temp: (!) 97.3 F (36.3 C)   SpO2:  100%      ECOG Performance Status: 1 - Symptomatic but completely ambulatory   EXAM 04/23/2019 GENERAL: Patient is a well appearing female in no acute distress HEENT:  PERRL, neck supple with midline trachea. Thyroid without masses.  NODES:  No cervical, supraclavicular, axillary, or inguinal lymphadenopathy palpated.  LUNGS:  Clear to auscultation bilaterally.  No wheezes or rhonchi. HEART:  Regular rate and rhythm. No murmur appreciated. ABDOMEN:  Soft, nontender.  Positive, normoactive bowel sounds.  EXTREMITIES:  No peripheral edema.   SKIN:  Clear with no obvious rashes or skin changes. No nail  dyscrasia. NEURO:  Nonfocal. Well oriented.  Appropriate affect.  PELVIC: exam chaperoned by nurse;   Vulva: normal appearing vulva with no masses, tenderness or lesions;  Vagina: atrophic, no discharge.  Cervix: The cervix is atrophic and small with no lesions seen;  Bimanual/RV: no masses nodularity  Lab Review n/a  Assessment:  Mary Hoover is a 74 y.o. female diagnosed with probably stage IIIc (T1b N1 Mx) squamous cell cancer of the cervix. On PET/CT large primary cervical cancer and small bilateral pelvic sidewall lymph nodes show discernible FDG accumulation.  Treated with external radiation and carboplatin at [facility] and brachytherapy at [facility] and completed 03/2019. NED on exam today.    Indeterminate 7 mm FDG active right upper lobe lung nodule, concerning for primary bronchogenic carcinoma vs metastatic disease 01/08/2019. Repeat CT scan yesterday shows slight growth of the lesion.   Medical co-morbidities complicating care: heavy smoker/emphysema, vascular disease s/p carotid endarterectomy.  Plan:   Problem List Items Addressed This Visit    None     She saw Dr. Massie Maroon on 07/30/2019 (RAD-ONC).  She saw Dr. Tasia Catchings on 06/25/2019 (MED-ONC).   Saw Dr Faith Rogue 4/21 and he discussed options including surveillance vs resection for small PET positive lung nodule that is slightly larger on 6/21 CT scan. She is reluctant to have surgery because half a lung would have to be removed and she is already having significant dyspnea due to COPD.  Has reduced smoking to half PPD. Dr Baruch Gouty suggests another CT in 3 months and if further growth he can treat this small area with radiation.  She is more comfortable with this plan.   Tobacco Cessation Counseling -She is a smoker and we discussed cessation programs, but she was not interested having tried in the past.  -She has decreased from over a pack a day to to half a pack a day.    Benedetto Goad, Student FNP  I personally had a face to  face interaction and evaluated the patient jointly with the NP Student, Mrs. Benedetto Goad.  I have reviewed her history and available records and have performed the key portions of the physical exam including general, HEENT, abdominal exam, pelvic exam with my findings confirming those documented above by the APP student.  I have discussed the case with the APP student and the patient.  I agree with the above documentation, assessment and plan which was fully formulated by me.  Counseling was completed by me.  Mellody Drown, MD

## 2019-07-30 ENCOUNTER — Encounter: Payer: Self-pay | Admitting: Radiation Oncology

## 2019-07-30 ENCOUNTER — Other Ambulatory Visit: Payer: Self-pay

## 2019-07-30 ENCOUNTER — Ambulatory Visit
Admission: RE | Admit: 2019-07-30 | Discharge: 2019-07-30 | Disposition: A | Discharge status: Home / Self Care | Payer: Medicare Other | Source: Ambulatory Visit | Attending: Radiation Oncology | Admitting: Radiation Oncology

## 2019-07-30 ENCOUNTER — Other Ambulatory Visit: Payer: Self-pay | Admitting: *Deleted

## 2019-07-30 ENCOUNTER — Inpatient Hospital Stay: Payer: Medicare Other | Attending: Obstetrics and Gynecology | Admitting: Obstetrics and Gynecology

## 2019-07-30 VITALS — BP 156/80 | Temp 97.3°F | Resp 12 | Wt 126.8 lb

## 2019-07-30 VITALS — BP 156/80 | HR 85 | Temp 97.3°F | Resp 12 | Wt 126.0 lb

## 2019-07-30 DIAGNOSIS — R911 Solitary pulmonary nodule: Secondary | ICD-10-CM

## 2019-07-30 DIAGNOSIS — Z923 Personal history of irradiation: Secondary | ICD-10-CM | POA: Insufficient documentation

## 2019-07-30 DIAGNOSIS — Z7951 Long term (current) use of inhaled steroids: Secondary | ICD-10-CM | POA: Diagnosis not present

## 2019-07-30 DIAGNOSIS — J449 Chronic obstructive pulmonary disease, unspecified: Secondary | ICD-10-CM | POA: Insufficient documentation

## 2019-07-30 DIAGNOSIS — Z7982 Long term (current) use of aspirin: Secondary | ICD-10-CM | POA: Diagnosis not present

## 2019-07-30 DIAGNOSIS — E785 Hyperlipidemia, unspecified: Secondary | ICD-10-CM | POA: Diagnosis not present

## 2019-07-30 DIAGNOSIS — Z9221 Personal history of antineoplastic chemotherapy: Secondary | ICD-10-CM | POA: Diagnosis not present

## 2019-07-30 DIAGNOSIS — E039 Hypothyroidism, unspecified: Secondary | ICD-10-CM | POA: Diagnosis not present

## 2019-07-30 DIAGNOSIS — I2510 Atherosclerotic heart disease of native coronary artery without angina pectoris: Secondary | ICD-10-CM | POA: Diagnosis not present

## 2019-07-30 DIAGNOSIS — F17210 Nicotine dependence, cigarettes, uncomplicated: Secondary | ICD-10-CM | POA: Insufficient documentation

## 2019-07-30 DIAGNOSIS — Z79899 Other long term (current) drug therapy: Secondary | ICD-10-CM | POA: Insufficient documentation

## 2019-07-30 DIAGNOSIS — C539 Malignant neoplasm of cervix uteri, unspecified: Secondary | ICD-10-CM | POA: Diagnosis not present

## 2019-07-30 DIAGNOSIS — I700 Atherosclerosis of aorta: Secondary | ICD-10-CM | POA: Insufficient documentation

## 2019-07-30 DIAGNOSIS — I10 Essential (primary) hypertension: Secondary | ICD-10-CM | POA: Insufficient documentation

## 2019-07-30 NOTE — Progress Notes (Signed)
Radiation Oncology Follow up Note  Name: Mary Hoover   Date:   07/30/2019 MRN:  [id number] DOB: [birth year]    This 74 y.o. female presents to the clinic today for 84-month follow-up status post radiation therapy for probable stage IIIc (T1b N1 MX) squamous cell carcinoma of the cervix.  REFERRING PROVIDER: [name], DO  HPI: Patient is a 74 year old female now at 4 months having completed both external beam radiation therapy as well as intracavitary brachytherapy at [facility] for stage IIIc squamous cell carcinoma of the cervix.  Seen today in routine follow-up she is doing well she specifically denies any vaginal discharge pelvic pain any increased lower urinary tract symptoms or diarrhea.  She does have a.  Spiculated right upper lobe nodule with slight growth from December 2020 as well as slight hypermetabolic activity on PET scan of 1220 worrisome for adenocarcinoma.  The lesion is less than 1 cm.  COMPLICATIONS OF TREATMENT: none  FOLLOW UP COMPLIANCE: keeps appointments   PHYSICAL EXAM:  BP (!) 156/80 (BP Location: Right Arm, Patient Position: Sitting, Cuff Size: Normal)   Temp (!) 97.3 F (36.3 C) (Tympanic)   Resp 12   Wt 126 lb 12.8 oz (57.5 kg)   BMI 19.86 kg/m  Well-developed well-nourished patient in NAD. HEENT reveals PERLA, EOMI, discs not visualized.  Oral cavity is clear. No oral mucosal lesions are identified. Neck is clear without evidence of cervical or supraclavicular adenopathy. Lungs are clear to A&P. Cardiac examination is essentially unremarkable with regular rate and rhythm without murmur rub or thrill. Abdomen is benign with no organomegaly or masses noted. Motor sensory and DTR levels are equal and symmetric in the upper and lower extremities. Cranial nerves II through XII are grossly intact. Proprioception is intact. No peripheral adenopathy or edema is identified. No motor or sensory levels are noted. Crude visual fields are within normal  range.  RADIOLOGY RESULTS: CT scan reviewed compatible with above-stated findings  PLAN: At this time I have ordered a repeat CT scan of the chest in approximately 3 months should the lesion continued to grow I will offer SBRT 6000 cGy in 5 fractions.  I have explained to her the low side effect profile of SBRT.  She has a pelvic examination with Dr. Fransisca Connors today in GYN oncology.  I have asked to see her back after completion of her CT scan of the chest in about 3 to 4 months.  She otherwise appears to be doing well  I would like to take this opportunity to thank you for allowing me to participate in the care of your patient.Noreene Filbert, MD

## 2019-07-30 NOTE — Progress Notes (Signed)
Patient here today for follow up regarding cervical cancer. Patient reports shortness of breath with cough. Patient also reports weakness and fatigue.

## 2019-08-05 ENCOUNTER — Other Ambulatory Visit: Payer: Self-pay

## 2019-08-05 ENCOUNTER — Other Ambulatory Visit: Payer: Medicare Other

## 2019-08-05 DIAGNOSIS — E034 Atrophy of thyroid (acquired): Secondary | ICD-10-CM

## 2019-08-06 ENCOUNTER — Other Ambulatory Visit: Payer: Self-pay | Admitting: Nurse Practitioner

## 2019-08-06 LAB — TSH: TSH: 0.013 u[IU]/mL — ABNORMAL LOW (ref 0.450–4.500)

## 2019-08-06 MED ORDER — LEVOTHYROXINE SODIUM 75 MCG PO TABS
75.0000 ug | ORAL_TABLET | Freq: Every day | ORAL | 3 refills | Status: DC
Start: 2019-08-06 — End: 2019-09-19

## 2019-08-06 NOTE — Progress Notes (Signed)
Attempted to call patient and left HIPAA compliant message, please let Mary Hoover know her TSH returned and thyroid continues to be over treated.  I am going to send in Levothyroxine 75 MCG for her to start taking immediately, stop the 100 MCG dosing.  We will then recheck TSH on outpatient labs in 6 weeks.  If any questions let me know and please schedule lab visit.  Thanks.

## 2019-09-18 ENCOUNTER — Other Ambulatory Visit: Payer: Self-pay

## 2019-09-18 ENCOUNTER — Other Ambulatory Visit: Payer: Medicare Other

## 2019-09-18 DIAGNOSIS — E034 Atrophy of thyroid (acquired): Secondary | ICD-10-CM

## 2019-09-19 ENCOUNTER — Other Ambulatory Visit: Payer: Self-pay | Admitting: Family Medicine

## 2019-09-19 DIAGNOSIS — E034 Atrophy of thyroid (acquired): Secondary | ICD-10-CM

## 2019-09-19 LAB — TSH: TSH: 0.439 u[IU]/mL — ABNORMAL LOW (ref 0.450–4.500)

## 2019-09-19 MED ORDER — LEVOTHYROXINE SODIUM 50 MCG PO TABS
50.0000 ug | ORAL_TABLET | Freq: Every day | ORAL | 1 refills | Status: DC
Start: 2019-09-19 — End: 2019-11-16

## 2019-10-08 ENCOUNTER — Encounter: Payer: Self-pay | Admitting: Family Medicine

## 2019-10-15 ENCOUNTER — Other Ambulatory Visit: Payer: Self-pay | Admitting: Family Medicine

## 2019-10-29 ENCOUNTER — Ambulatory Visit
Admission: RE | Admit: 2019-10-29 | Discharge: 2019-10-29 | Disposition: A | Discharge status: Home / Self Care | Payer: Medicare Other | Source: Ambulatory Visit | Attending: Radiation Oncology | Admitting: Radiation Oncology

## 2019-10-29 ENCOUNTER — Other Ambulatory Visit: Payer: Self-pay

## 2019-10-29 DIAGNOSIS — I2510 Atherosclerotic heart disease of native coronary artery without angina pectoris: Secondary | ICD-10-CM | POA: Diagnosis not present

## 2019-10-29 DIAGNOSIS — J439 Emphysema, unspecified: Secondary | ICD-10-CM | POA: Diagnosis not present

## 2019-10-29 DIAGNOSIS — R911 Solitary pulmonary nodule: Secondary | ICD-10-CM | POA: Insufficient documentation

## 2019-10-29 DIAGNOSIS — I700 Atherosclerosis of aorta: Secondary | ICD-10-CM | POA: Diagnosis not present

## 2019-10-29 HISTORY — DX: Malignant (primary) neoplasm, unspecified: C80.1

## 2019-10-29 LAB — POCT I-STAT CREATININE: Creatinine, Ser: 0.9 mg/dL (ref 0.44–1.00)

## 2019-10-29 MED ORDER — IOHEXOL 300 MG/ML  SOLN
75.0000 mL | Freq: Once | INTRAMUSCULAR | Status: AC | PRN
  Administered 2019-10-29: 75 mL via INTRAVENOUS

## 2019-10-31 ENCOUNTER — Ambulatory Visit: Payer: Medicare Other | Admitting: Family Medicine

## 2019-11-05 ENCOUNTER — Ambulatory Visit: Admission: RE | Admit: 2019-11-05 | Payer: Medicare Other | Source: Ambulatory Visit | Admitting: Radiation Oncology

## 2019-11-14 ENCOUNTER — Ambulatory Visit (INDEPENDENT_AMBULATORY_CARE_PROVIDER_SITE_OTHER): Payer: Medicare Other | Admitting: Family Medicine

## 2019-11-14 ENCOUNTER — Other Ambulatory Visit: Payer: Self-pay

## 2019-11-14 VITALS — BP 134/68 | HR 81 | Temp 98.7°F | Resp 16 | Wt 126.8 lb

## 2019-11-14 DIAGNOSIS — E782 Mixed hyperlipidemia: Secondary | ICD-10-CM | POA: Diagnosis not present

## 2019-11-14 DIAGNOSIS — I10 Essential (primary) hypertension: Secondary | ICD-10-CM | POA: Diagnosis not present

## 2019-11-14 DIAGNOSIS — E034 Atrophy of thyroid (acquired): Secondary | ICD-10-CM

## 2019-11-14 DIAGNOSIS — Z23 Encounter for immunization: Secondary | ICD-10-CM | POA: Diagnosis not present

## 2019-11-14 NOTE — Progress Notes (Signed)
BP 134/68 (BP Location: Left Arm, Cuff Size: Normal)   Pulse 81   Temp 98.7 F (37.1 C) (Oral)   Resp 16   Wt 126 lb 12.8 oz (57.5 kg)   BMI 19.86 kg/m    Subjective:    Patient ID: Mary Hoover, female    DOB: [birth year], 74 y.o.   MRN: [id number]  HPI: Mary Hoover is a 74 y.o. female  Chief Complaint  Patient presents with  . Hypertension  . Hyperlipidemia  . Hypothyroidism   HYPERTENSION / HYPERLIPIDEMIA Satisfied with current treatment? no Duration of hypertension: chronic BP monitoring frequency: not checking BP medication side effects: no Past BP meds: lisinopril Duration of hyperlipidemia: chronic Cholesterol medication side effects: not on anything Cholesterol supplements: none Past cholesterol medications: none Medication compliance: excellent compliance Aspirin: yes Recent stressors: no Recurrent headaches: no Visual changes: no Palpitations: no Dyspnea: no Chest pain: no Lower extremity edema: no Dizzy/lightheaded: no  HYPOTHYROIDISM Thyroid control status:unsure Satisfied with current treatment? yes Medication side effects: no Medication compliance: good compliance Recent dose adjustment:no Fatigue: yes Cold intolerance: no Heat intolerance: no Weight gain: no Weight loss: no Constipation: no Diarrhea/loose stools: no Palpitations: no Lower extremity edema: no Anxiety/depressed mood: no  Has been following with oncology for her cervical cancer. She has had a cough. Just had a CT done with Dr. Grayland Ormond. She is otherwise doing OK right no other concerns or complaints this time.   Relevant past medical, surgical, family and social history reviewed and updated as indicated. Interim medical history since our last visit reviewed. Allergies and medications reviewed and updated.  Review of Systems  Constitutional: Negative.   HENT: Negative.   Respiratory: Positive for cough, shortness of breath and wheezing. Negative for apnea,  choking, chest tightness and stridor.   Cardiovascular: Negative.   Gastrointestinal: Negative.   Neurological: Negative.   Psychiatric/Behavioral: Negative.     Per HPI unless specifically indicated above     Objective:    BP 134/68 (BP Location: Left Arm, Cuff Size: Normal)   Pulse 81   Temp 98.7 F (37.1 C) (Oral)   Resp 16   Wt 126 lb 12.8 oz (57.5 kg)   BMI 19.86 kg/m   Wt Readings from Last 3 Encounters:  11/14/19 126 lb 12.8 oz (57.5 kg)  07/30/19 126 lb 12.8 oz (57.5 kg)  07/30/19 126 lb (57.2 kg)    Physical Exam Vitals and nursing note reviewed.  Constitutional:      General: She is not in acute distress.    Appearance: Normal appearance. She is not ill-appearing, toxic-appearing or diaphoretic.  HENT:     Head: Normocephalic and atraumatic.     Right Ear: External ear normal.     Left Ear: External ear normal.     Nose: Nose normal.     Mouth/Throat:     Mouth: Mucous membranes are moist.     Pharynx: Oropharynx is clear.  Eyes:     General: No scleral icterus.       Right eye: No discharge.        Left eye: No discharge.     Extraocular Movements: Extraocular movements intact.     Conjunctiva/sclera: Conjunctivae normal.     Pupils: Pupils are equal, round, and reactive to light.  Cardiovascular:     Rate and Rhythm: Normal rate and regular rhythm.     Pulses: Normal pulses.     Heart sounds: Normal heart sounds. No murmur heard.  No friction rub. No gallop.   Pulmonary:     Effort: Pulmonary effort is normal. No respiratory distress.     Breath sounds: No stridor. Wheezing present. No rhonchi or rales.  Chest:     Chest wall: No tenderness.  Musculoskeletal:        General: Normal range of motion.     Cervical back: Normal range of motion and neck supple.  Skin:    General: Skin is warm and dry.     Capillary Refill: Capillary refill takes less than 2 seconds.     Coloration: Skin is not jaundiced or pale.     Findings: No bruising, erythema,  lesion or rash.  Neurological:     General: No focal deficit present.     Mental Status: She is alert and oriented to person, place, and time. Mental status is at baseline.  Psychiatric:        Mood and Affect: Mood normal.        Behavior: Behavior normal.        Thought Content: Thought content normal.        Judgment: Judgment normal.     Results for orders placed or performed in visit on 11/14/19  CBC with Differential/Platelet  Result Value Ref Range   WBC 7.4 3.4 - 10.8 x10E3/uL   RBC 3.53 (L) 3.77 - 5.28 x10E6/uL   Hemoglobin 11.7 11.1 - 15.9 g/dL   Hematocrit 33.8 (L) 34.0 - 46.6 %   MCV 96 79 - 97 fL   MCH 33.1 (H) 26.6 - 33.0 pg   MCHC 34.6 31 - 35 g/dL   RDW 12.3 11.7 - 15.4 %   Platelets 320 150 - 450 x10E3/uL   Neutrophils 84 Not Estab. %   Lymphs 8 Not Estab. %   Monocytes 6 Not Estab. %   Eos 1 Not Estab. %   Basos 1 Not Estab. %   Neutrophils Absolute 6.3 1 - 7 x10E3/uL   Lymphocytes Absolute 0.6 (L) 0 - 3 x10E3/uL   Monocytes Absolute 0.4 0 - 0 x10E3/uL   EOS (ABSOLUTE) 0.1 0.0 - 0.4 x10E3/uL   Basophils Absolute 0.0 0 - 0 x10E3/uL   Immature Granulocytes 0 Not Estab. %   Immature Grans (Abs) 0.0 0.0 - 0.1 x10E3/uL  Comprehensive metabolic panel  Result Value Ref Range   Glucose 90 65 - 99 mg/dL   BUN 12 8 - 27 mg/dL   Creatinine, Ser 1.02 (H) 0.57 - 1.00 mg/dL   GFR calc non Af Amer 55 (L) >59 mL/min/1.73   GFR calc Af Amer 64 >59 mL/min/1.73   BUN/Creatinine Ratio 12 12 - 28   Sodium 127 (L) 134 - 144 mmol/L   Potassium 4.4 3.5 - 5.2 mmol/L   Chloride 92 (L) 96 - 106 mmol/L   CO2 21 20 - 29 mmol/L   Calcium 9.2 8.7 - 10.3 mg/dL   Total Protein 7.3 6.0 - 8.5 g/dL   Albumin 4.4 3.7 - 4.7 g/dL   Globulin, Total 2.9 1.5 - 4.5 g/dL   Albumin/Globulin Ratio 1.5 1.2 - 2.2   Bilirubin Total 0.2 0.0 - 1.2 mg/dL   Alkaline Phosphatase 91 44 - 121 IU/L   AST 17 0 - 40 IU/L   ALT 12 0 - 32 IU/L  Lipid Panel w/o Chol/HDL Ratio  Result Value Ref Range    Cholesterol, Total 217 (H) 100 - 199 mg/dL   Triglycerides 152 (H) 0 - 149 mg/dL   HDL  60 >39 mg/dL   VLDL Cholesterol Cal 27 5 - 40 mg/dL   LDL Chol Calc (NIH) 130 (H) 0 - 99 mg/dL  TSH  Result Value Ref Range   TSH 5.510 (H) 0.450 - 4.500 uIU/mL      Assessment & Plan:   Problem List Items Addressed This Visit      Cardiovascular and Mediastinum   Essential hypertension - Primary    Better on recheck. Continue current regimen. Continue to monitor. Call with any concerns.      Relevant Orders   CBC with Differential/Platelet (Completed)   Comprehensive metabolic panel (Completed)     Endocrine   Hypothyroidism    Rechecking levels today. Await results. Treat as needed.       Relevant Orders   CBC with Differential/Platelet (Completed)   Comprehensive metabolic panel (Completed)   TSH (Completed)     Other   Hyperlipidemia    Under good control on current regimen. Continue current regimen. Continue to monitor. Call with any concerns. Refills given. Labs drawn today.       Relevant Orders   CBC with Differential/Platelet (Completed)   Comprehensive metabolic panel (Completed)   Lipid Panel w/o Chol/HDL Ratio (Completed)    Other Visit Diagnoses    Need for immunization against influenza       Flu shot given today.    Relevant Orders   Flu Vaccine QUAD High Dose(Fluad) (Completed)       Follow up plan: Return in about 6 months (around 05/14/2020).

## 2019-11-15 LAB — COMPREHENSIVE METABOLIC PANEL
ALT: 12 IU/L (ref 0–32)
AST: 17 IU/L (ref 0–40)
Albumin/Globulin Ratio: 1.5 (ref 1.2–2.2)
Albumin: 4.4 g/dL (ref 3.7–4.7)
Alkaline Phosphatase: 91 IU/L (ref 44–121)
BUN/Creatinine Ratio: 12 (ref 12–28)
BUN: 12 mg/dL (ref 8–27)
Bilirubin Total: 0.2 mg/dL (ref 0.0–1.2)
CO2: 21 mmol/L (ref 20–29)
Calcium: 9.2 mg/dL (ref 8.7–10.3)
Chloride: 92 mmol/L — ABNORMAL LOW (ref 96–106)
Creatinine, Ser: 1.02 mg/dL — ABNORMAL HIGH (ref 0.57–1.00)
GFR calc Af Amer: 64 mL/min/{1.73_m2} (ref >59)
GFR calc non Af Amer: 55 mL/min/{1.73_m2} — ABNORMAL LOW (ref >59)
Globulin, Total: 2.9 g/dL (ref 1.5–4.5)
Glucose: 90 mg/dL (ref 65–99)
Potassium: 4.4 mmol/L (ref 3.5–5.2)
Sodium: 127 mmol/L — ABNORMAL LOW (ref 134–144)
Total Protein: 7.3 g/dL (ref 6.0–8.5)

## 2019-11-15 LAB — CBC WITH DIFFERENTIAL/PLATELET
Basophils Absolute: 0 10*3/uL (ref 0.0–0.2)
Basos: 1 %
EOS (ABSOLUTE): 0.1 10*3/uL (ref 0.0–0.4)
Eos: 1 %
Hematocrit: 33.8 % — ABNORMAL LOW (ref 34.0–46.6)
Hemoglobin: 11.7 g/dL (ref 11.1–15.9)
Immature Grans (Abs): 0 10*3/uL (ref 0.0–0.1)
Immature Granulocytes: 0 %
Lymphocytes Absolute: 0.6 10*3/uL — ABNORMAL LOW (ref 0.7–3.1)
Lymphs: 8 %
MCH: 33.1 pg — ABNORMAL HIGH (ref 26.6–33.0)
MCHC: 34.6 g/dL (ref 31.5–35.7)
MCV: 96 fL (ref 79–97)
Monocytes Absolute: 0.4 10*3/uL (ref 0.1–0.9)
Monocytes: 6 %
Neutrophils Absolute: 6.3 10*3/uL (ref 1.4–7.0)
Neutrophils: 84 %
Platelets: 320 10*3/uL (ref 150–450)
RBC: 3.53 x10E6/uL — ABNORMAL LOW (ref 3.77–5.28)
RDW: 12.3 % (ref 11.7–15.4)
WBC: 7.4 10*3/uL (ref 3.4–10.8)

## 2019-11-15 LAB — LIPID PANEL W/O CHOL/HDL RATIO
Cholesterol, Total: 217 mg/dL — ABNORMAL HIGH (ref 100–199)
HDL: 60 mg/dL (ref >39)
LDL Chol Calc (NIH): 130 mg/dL — ABNORMAL HIGH (ref 0–99)
Triglycerides: 152 mg/dL — ABNORMAL HIGH (ref 0–149)
VLDL Cholesterol Cal: 27 mg/dL (ref 5–40)

## 2019-11-15 LAB — TSH: TSH: 5.51 u[IU]/mL — ABNORMAL HIGH (ref 0.450–4.500)

## 2019-11-16 ENCOUNTER — Other Ambulatory Visit: Payer: Self-pay | Admitting: Family Medicine

## 2019-11-16 MED ORDER — LEVOTHYROXINE SODIUM 75 MCG PO TABS
75.0000 ug | ORAL_TABLET | Freq: Every day | ORAL | 1 refills | Status: DC
Start: 2019-11-16 — End: 2019-11-17

## 2019-11-16 NOTE — Assessment & Plan Note (Signed)
Under good control on current regimen. Continue current regimen. Continue to monitor. Call with any concerns. Refills given. Labs drawn today.   

## 2019-11-16 NOTE — Assessment & Plan Note (Signed)
Better on recheck. Continue current regimen. Continue to monitor. Call with any concerns.  

## 2019-11-16 NOTE — Assessment & Plan Note (Signed)
Rechecking levels today. Await results. Treat as needed.  

## 2019-11-17 ENCOUNTER — Other Ambulatory Visit: Payer: Self-pay

## 2019-11-17 ENCOUNTER — Ambulatory Visit
Admission: RE | Admit: 2019-11-17 | Discharge: 2019-11-17 | Disposition: A | Discharge status: Home / Self Care | Payer: Medicare Other | Source: Ambulatory Visit | Attending: Radiation Oncology | Admitting: Radiation Oncology

## 2019-11-17 ENCOUNTER — Other Ambulatory Visit: Payer: Self-pay | Admitting: *Deleted

## 2019-11-17 ENCOUNTER — Other Ambulatory Visit: Payer: Self-pay | Admitting: Family Medicine

## 2019-11-17 ENCOUNTER — Encounter: Payer: Self-pay | Admitting: Radiation Oncology

## 2019-11-17 VITALS — BP 157/76 | HR 74 | Temp 97.2°F | Resp 16 | Wt 127.3 lb

## 2019-11-17 DIAGNOSIS — Z923 Personal history of irradiation: Secondary | ICD-10-CM | POA: Insufficient documentation

## 2019-11-17 DIAGNOSIS — R911 Solitary pulmonary nodule: Secondary | ICD-10-CM | POA: Diagnosis not present

## 2019-11-17 DIAGNOSIS — C539 Malignant neoplasm of cervix uteri, unspecified: Secondary | ICD-10-CM | POA: Diagnosis present

## 2019-11-17 MED ORDER — LEVOTHYROXINE SODIUM 75 MCG PO TABS
75.0000 ug | ORAL_TABLET | Freq: Every day | ORAL | 1 refills | Status: DC
Start: 2019-11-17 — End: 2020-01-13

## 2019-11-17 NOTE — Progress Notes (Signed)
Radiation Oncology Follow up Note OP NA space outpatient new area right lower lobe nodule  Name: Mary Hoover   Date:   11/17/2019 MRN:  [id number] DOB: [birth year]    This 74 y.o. female presents to the clinic today for 54-month follow-up status post radiation therapy for probable stage IIIc (T1b N1 MX) squamous cell carcinoma of the cervix.Marland Kitchen  REFERRING PROVIDER: [name], DO  HPI: Patient is a [age]-year-old female now out 8 months having completed both external beam radiation therapy as well as intracavity brachytherapy at [facility] for stage IIIc squamous cell carcinoma of the cervix seen today in routine follow-up she is doing well.  She specifically denies any increased lower urinary tract symptoms diarrhea or fatigue.  We have been tracking a right lower lobe nodule which has been progressing over time and.  Back in December 2020 was mildly hypermetabolic.  Recent CT scan in September showed right-sided pulmonary nodule further interval enlargement since June 2021 highly suspicious for primary bronchogenic carcinoma.  She is fairly asymptomatic specifically denies cough hemoptysis or chest tightness.  COMPLICATIONS OF TREATMENT: none  FOLLOW UP COMPLIANCE: keeps appointments   PHYSICAL EXAM:  BP (!) 157/76 (BP Location: Left Arm, Patient Position: Sitting, Cuff Size: Normal)    Pulse 74    Temp (!) 97.2 F (36.2 C) (Tympanic)    Resp 16    Wt 127 lb 4.8 oz (57.7 kg)    BMI 19.94 kg/m  Well-developed well-nourished patient in NAD. HEENT reveals PERLA, EOMI, discs not visualized.  Oral cavity is clear. No oral mucosal lesions are identified. Neck is clear without evidence of cervical or supraclavicular adenopathy. Lungs are clear to A&P. Cardiac examination is essentially unremarkable with regular rate and rhythm without murmur rub or thrill. Abdomen is benign with no organomegaly or masses noted. Motor sensory and DTR levels are equal and symmetric in the upper and lower extremities.  Cranial nerves II through XII are grossly intact. Proprioception is intact. No peripheral adenopathy or edema is identified. No motor or sensory levels are noted. Crude visual fields are within normal range.  RADIOLOGY RESULTS: CT scans and PET CT scans reviewed compatible with above-stated findings PLAN: Present time I have ordered a PET scan on patient to see again whether her lesion has progressed with hypermetabolic activity versus whether there is any evidence of mediastinal activity which would upstage the patient.  Should this show hypermetabolic [place] he will proceed with SBRT plan on delivering 60 Gray in 5 fractions.  Risks and benefits of SBRT and the low side effect profile were all discussed in detail with the patient.  I will see her back in follow-up after her PET CT scan.  This will also give [place] better delineation of progress in her GYN malignancy.  Patient comprehends my recommendations well.  I would like to take this opportunity to thank you for allowing me to participate in the care of your patient.Noreene Filbert, MD

## 2019-11-27 ENCOUNTER — Ambulatory Visit
Admission: RE | Admit: 2019-11-27 | Discharge: 2019-11-27 | Disposition: A | Discharge status: Home / Self Care | Payer: Medicare Other | Source: Ambulatory Visit | Attending: Radiation Oncology | Admitting: Radiation Oncology

## 2019-11-27 ENCOUNTER — Other Ambulatory Visit: Payer: Self-pay

## 2019-11-27 DIAGNOSIS — J439 Emphysema, unspecified: Secondary | ICD-10-CM | POA: Diagnosis not present

## 2019-11-27 DIAGNOSIS — I700 Atherosclerosis of aorta: Secondary | ICD-10-CM | POA: Diagnosis not present

## 2019-11-27 DIAGNOSIS — R911 Solitary pulmonary nodule: Secondary | ICD-10-CM | POA: Insufficient documentation

## 2019-11-27 DIAGNOSIS — I2510 Atherosclerotic heart disease of native coronary artery without angina pectoris: Secondary | ICD-10-CM | POA: Diagnosis not present

## 2019-11-27 LAB — GLUCOSE, CAPILLARY: Glucose-Capillary: 96 mg/dL (ref 70–99)

## 2019-11-27 MED ORDER — FLUDEOXYGLUCOSE F - 18 (FDG) INJECTION
6.6000 | Freq: Once | INTRAVENOUS | Status: AC | PRN
  Administered 2019-11-27: 6.82 via INTRAVENOUS

## 2019-12-01 ENCOUNTER — Ambulatory Visit: Payer: Self-pay | Admitting: *Deleted

## 2019-12-01 NOTE — Telephone Encounter (Signed)
She needs to go to the ER

## 2019-12-01 NOTE — Telephone Encounter (Signed)
C/o increasing SOB and difficulty breathing with any exertion. Patient reports she has used inhaler more than recommended due to SOB. Used last medication from a nebulizer but has no more medication for nebulizer. Nebulizer not on current medication list . C/o productive cough, mucus clear. Denies fever, chest pain, dizziness. No available virtual visit until 12/09/19.Encouraged patient to go to [facility] or ED.  Care advise noted . Patient verbalized understanding of care advise and to go to [facility] or ED if symptoms worsen. Patient requesting not to go to ED due to wait time.   Reason for Disposition . [1] Longstanding difficulty breathing (e.g., CHF, COPD, emphysema) AND [2] WORSE than normal  Answer Assessment - Initial Assessment Questions 1. RESPIRATORY STATUS: "Describe your breathing?" (e.g., wheezing, shortness of breath, unable to speak, severe coughing)      SOB, speaking in phrases, cough with congestion 2. ONSET: "When did this breathing problem begin?"      4-5 days ago  3. PATTERN "Does the difficult breathing come and go, or has it been constant since it started?"      Becoming constant  4. SEVERITY: "How bad is your breathing?" (e.g., mild, moderate, severe)    - MILD: No SOB at rest, mild SOB with walking, speaks normally in sentences, can lay down, no retractions, pulse < 100.    - MODERATE: SOB at rest, SOB with minimal exertion and prefers to sit, cannot lie down flat, speaks in phrases, mild retractions, audible wheezing, pulse 100-120.    - SEVERE: Very SOB at rest, speaks in single words, struggling to breathe, sitting hunched forward, retractions, pulse > 120      Moderate  5. RECURRENT SYMPTOM: "Have you had difficulty breathing before?" If Yes, ask: "When was the last time?" and "What happened that time?"      Yes hx COPD 6. CARDIAC HISTORY: "Do you have any history of heart disease?" (e.g., heart attack, angina, bypass surgery, angioplasty)      no 7. LUNG HISTORY: "Do you have  any history of lung disease?"  (e.g., pulmonary embolus, asthma, emphysema)     COPD, emphysema 8. CAUSE: "What do you think is causing the breathing problem?"      Had a cold last week and will not go away 9. OTHER SYMPTOMS: "Do you have any other symptoms? (e.g., dizziness, runny nose, cough, chest pain, fever)     Cough  10. PREGNANCY: "Is there any chance you are pregnant?" "When was your last menstrual period?"       na 11. TRAVEL: "Have you traveled out of the country in the last month?" (e.g., travel history, exposures)       na  Protocols used: BREATHING DIFFICULTY-A-AH

## 2019-12-01 NOTE — Telephone Encounter (Signed)
Tried calling patient twice, got a busy signal both times. Will try to call again first thing in the morning.

## 2019-12-02 ENCOUNTER — Encounter: Payer: Self-pay | Admitting: Radiation Oncology

## 2019-12-02 NOTE — Telephone Encounter (Signed)
Called pt advised her to go the the ER she is still having SOB this morning. Pt stated she would try to go the the ER sometimes today. Pt has her grandson and needs to take him home first.  [place]

## 2019-12-03 ENCOUNTER — Encounter: Payer: Self-pay | Admitting: Radiation Oncology

## 2019-12-03 ENCOUNTER — Ambulatory Visit
Admission: RE | Admit: 2019-12-03 | Discharge: 2019-12-03 | Disposition: A | Discharge status: Home / Self Care | Payer: Medicare Other | Source: Ambulatory Visit | Attending: Radiation Oncology | Admitting: Radiation Oncology

## 2019-12-03 ENCOUNTER — Other Ambulatory Visit: Payer: Self-pay

## 2019-12-03 VITALS — BP 143/64 | HR 82 | Temp 96.6°F | Wt 125.0 lb

## 2019-12-03 DIAGNOSIS — Z8541 Personal history of malignant neoplasm of cervix uteri: Secondary | ICD-10-CM | POA: Insufficient documentation

## 2019-12-03 DIAGNOSIS — J449 Chronic obstructive pulmonary disease, unspecified: Secondary | ICD-10-CM | POA: Diagnosis not present

## 2019-12-03 DIAGNOSIS — Z923 Personal history of irradiation: Secondary | ICD-10-CM | POA: Diagnosis not present

## 2019-12-03 DIAGNOSIS — R918 Other nonspecific abnormal finding of lung field: Secondary | ICD-10-CM | POA: Insufficient documentation

## 2019-12-03 DIAGNOSIS — R911 Solitary pulmonary nodule: Secondary | ICD-10-CM

## 2019-12-03 NOTE — Progress Notes (Signed)
Radiation Oncology Follow up Note  Name: Mary Hoover   Date:   12/03/2019 MRN:  [id number] DOB: 06/10/1945    This 74 y.o. female presents to the clinic today for review of her PET CT scan for right upper lobe presumed stage I lung cancer and patient previously treated for stage IIIc squamous cell carcinoma of the cervix.  REFERRING PROVIDER: [name], DO  HPI: Patient is a 74 year old female well-known to department having been treated proximally 9 months ago with radiation therapy for a stage IIIc (T B1 N1 MX) squamous cell carcinoma the cervix.  She has done well with no specific symptoms or side effect profile from her pelvic radiation.  We have been tracking a right upper lobe pulmonary nodule which has been progressing over time.  PET CT scan back in December 2020 was mildly hypermetabolic.  CT scan in September showed pulmonary nodule with further interval enlargement and I ordered a PET CT scan..  Recent PET CT scan showed interval increase again of the right upper lobe nodule with hypermetabolic activity both increasing over time highly suspicious for malignancy.  She has significant COPD changes of her lung.  She does have mild hypermetabolic T she does have some mild increased hypermetabolic activity in bilateral hilar lymph nodes and AP window is nodes which would be highly unlikely for stage I non-small cell lung cancer.  She she continues to have a mild whitish productive cough no dysphagia hemoptysis or chest tightness.  COMPLICATIONS OF TREATMENT: none  FOLLOW UP COMPLIANCE: keeps appointments   PHYSICAL EXAM:  BP (!) 143/64   Pulse 82   Temp (!) 96.6 F (35.9 C) (Tympanic)   Wt 125 lb (56.7 kg)   SpO2 97% Comment: on room air  BMI 19.58 kg/m  Well-developed well-nourished patient in NAD. HEENT reveals PERLA, EOMI, discs not visualized.  Oral cavity is clear. No oral mucosal lesions are identified. Neck is clear without evidence of cervical or supraclavicular  adenopathy. Lungs are clear to A&P. Cardiac examination is essentially unremarkable with regular rate and rhythm without murmur rub or thrill. Abdomen is benign with no organomegaly or masses noted. Motor sensory and DTR levels are equal and symmetric in the upper and lower extremities. Cranial nerves II through XII are grossly intact. Proprioception is intact. No peripheral adenopathy or edema is identified. No motor or sensory levels are noted. Crude visual fields are within normal range.  RADIOLOGY RESULTS: Serial CT scans in serial PET CT scans reviewed compatible with above-stated findings highly indicative of stage I non-small cell lung cancer of the right upper lobe  PLAN: At this time I have offered SBRT 60 Gray in 5 fractions to the right upper lobe lesion.  I will asked not be targeting any of her lymph node since it would be highly unlikely she would have bilateral and 3 lymph nodes from such a small non-small cell lung cancer.  Risks and benefits of treatment including possible fatigue possible development of cough all were described in detail to the patient.  She comprehends my treatment plan well.  I have ordered CT simulation for next week.  I would like to take this opportunity to thank you for allowing me to participate in the care of your patient.Noreene Filbert, MD

## 2019-12-11 ENCOUNTER — Ambulatory Visit: Payer: Medicare Other

## 2019-12-11 DIAGNOSIS — Z510 Encounter for antineoplastic radiation therapy: Secondary | ICD-10-CM | POA: Insufficient documentation

## 2019-12-11 DIAGNOSIS — C3411 Malignant neoplasm of upper lobe, right bronchus or lung: Secondary | ICD-10-CM | POA: Insufficient documentation

## 2019-12-11 DIAGNOSIS — R918 Other nonspecific abnormal finding of lung field: Secondary | ICD-10-CM | POA: Diagnosis not present

## 2019-12-22 DIAGNOSIS — C3411 Malignant neoplasm of upper lobe, right bronchus or lung: Secondary | ICD-10-CM | POA: Diagnosis not present

## 2019-12-22 DIAGNOSIS — Z510 Encounter for antineoplastic radiation therapy: Secondary | ICD-10-CM | POA: Diagnosis not present

## 2019-12-22 DIAGNOSIS — R918 Other nonspecific abnormal finding of lung field: Secondary | ICD-10-CM | POA: Diagnosis not present

## 2019-12-29 ENCOUNTER — Ambulatory Visit
Admission: RE | Admit: 2019-12-29 | Discharge: 2019-12-29 | Disposition: A | Discharge status: Home / Self Care | Payer: Medicare Other | Source: Ambulatory Visit | Attending: Radiation Oncology | Admitting: Radiation Oncology

## 2019-12-29 DIAGNOSIS — Z510 Encounter for antineoplastic radiation therapy: Secondary | ICD-10-CM | POA: Diagnosis not present

## 2019-12-29 DIAGNOSIS — C3411 Malignant neoplasm of upper lobe, right bronchus or lung: Secondary | ICD-10-CM | POA: Diagnosis not present

## 2019-12-31 ENCOUNTER — Ambulatory Visit
Admission: RE | Admit: 2019-12-31 | Discharge: 2019-12-31 | Disposition: A | Discharge status: Home / Self Care | Payer: Medicare Other | Source: Ambulatory Visit | Attending: Radiation Oncology | Admitting: Radiation Oncology

## 2019-12-31 DIAGNOSIS — C3411 Malignant neoplasm of upper lobe, right bronchus or lung: Secondary | ICD-10-CM | POA: Insufficient documentation

## 2019-12-31 DIAGNOSIS — Z510 Encounter for antineoplastic radiation therapy: Secondary | ICD-10-CM | POA: Insufficient documentation

## 2020-01-01 ENCOUNTER — Other Ambulatory Visit: Payer: Medicare Other

## 2020-01-05 ENCOUNTER — Ambulatory Visit
Admission: RE | Admit: 2020-01-05 | Discharge: 2020-01-05 | Disposition: A | Discharge status: Home / Self Care | Payer: Medicare Other | Source: Ambulatory Visit | Attending: Radiation Oncology | Admitting: Radiation Oncology

## 2020-01-05 DIAGNOSIS — Z510 Encounter for antineoplastic radiation therapy: Secondary | ICD-10-CM | POA: Diagnosis not present

## 2020-01-05 DIAGNOSIS — C3411 Malignant neoplasm of upper lobe, right bronchus or lung: Secondary | ICD-10-CM | POA: Diagnosis not present

## 2020-01-07 ENCOUNTER — Ambulatory Visit
Admission: RE | Admit: 2020-01-07 | Discharge: 2020-01-07 | Disposition: A | Discharge status: Home / Self Care | Payer: Medicare Other | Source: Ambulatory Visit | Attending: Radiation Oncology | Admitting: Radiation Oncology

## 2020-01-07 DIAGNOSIS — Z510 Encounter for antineoplastic radiation therapy: Secondary | ICD-10-CM | POA: Diagnosis not present

## 2020-01-07 DIAGNOSIS — C3411 Malignant neoplasm of upper lobe, right bronchus or lung: Secondary | ICD-10-CM | POA: Diagnosis not present

## 2020-01-08 ENCOUNTER — Other Ambulatory Visit: Payer: Self-pay | Admitting: Family Medicine

## 2020-01-12 ENCOUNTER — Ambulatory Visit
Admission: RE | Admit: 2020-01-12 | Discharge: 2020-01-12 | Disposition: A | Discharge status: Home / Self Care | Payer: Medicare Other | Source: Ambulatory Visit | Attending: Radiation Oncology | Admitting: Radiation Oncology

## 2020-01-12 DIAGNOSIS — R918 Other nonspecific abnormal finding of lung field: Secondary | ICD-10-CM | POA: Diagnosis not present

## 2020-01-12 DIAGNOSIS — C3411 Malignant neoplasm of upper lobe, right bronchus or lung: Secondary | ICD-10-CM | POA: Diagnosis not present

## 2020-01-12 DIAGNOSIS — Z510 Encounter for antineoplastic radiation therapy: Secondary | ICD-10-CM | POA: Diagnosis not present

## 2020-01-13 ENCOUNTER — Other Ambulatory Visit: Payer: Self-pay | Admitting: Family Medicine

## 2020-01-13 NOTE — Telephone Encounter (Signed)
Please advise 

## 2020-01-13 NOTE — Telephone Encounter (Signed)
Requested medication (s) are due for refill today: yes  Requested medication (s) are on the active medication list: yes  Last refill:  11/17/19  #30 1 refill  Future visit scheduled:yes  Notes to clinic: Lab note states she should have lab rechecked in 6 weeks TSH 5.510    Requested Prescriptions  Pending Prescriptions Disp Refills   levothyroxine (SYNTHROID) 75 MCG tablet [Pharmacy Med Name: Levothyroxine Sodium 75 MCG Oral Tablet] 60 tablet 5    Sig: TAKE 1 TABLET BY MOUTH  DAILY      Endocrinology:  Hypothyroid Agents Failed - 01/13/2020  3:30 PM      Failed - TSH needs to be rechecked within 3 months after an abnormal result. Refill until TSH is due.      Failed - TSH in normal range and within 360 days    TSH  Date Value Ref Range Status  11/14/2019 5.510 (H) 0.450 - 4.500 uIU/mL Final          Passed - Valid encounter within last 12 months    Recent Outpatient Visits           2 months ago Essential hypertension   [place], Megan P, DO   8 months ago Routine general medical examination at a health care facility   [facility], [place] P, DO   12 months ago Acquired hypothyroidism   [place], Megan P, DO   1 year ago BV (bacterial vaginosis)   [place], [place], DO   1 year ago Hypothyroidism due to acquired atrophy of thyroid   [facility] [name], DO       Future Appointments             In 4 months Wynetta Emery, Barb Merino, DO [facility]

## 2020-02-04 ENCOUNTER — Inpatient Hospital Stay: Payer: Medicare HMO

## 2020-02-04 ENCOUNTER — Telehealth: Payer: Self-pay | Admitting: Obstetrics and Gynecology

## 2020-02-04 NOTE — Telephone Encounter (Signed)
Call placed to patient in regards to missed appointment. Unable to reach pt.

## 2020-02-12 ENCOUNTER — Encounter: Payer: Self-pay | Admitting: Radiation Oncology

## 2020-02-12 ENCOUNTER — Other Ambulatory Visit: Payer: Self-pay | Admitting: *Deleted

## 2020-02-12 ENCOUNTER — Ambulatory Visit
Admission: RE | Admit: 2020-02-12 | Discharge: 2020-02-12 | Disposition: A | Discharge status: Home / Self Care | Payer: Medicare HMO | Source: Ambulatory Visit | Attending: Radiation Oncology | Admitting: Radiation Oncology

## 2020-02-12 VITALS — BP 120/63 | HR 77 | Temp 97.4°F | Resp 16 | Wt 134.2 lb

## 2020-02-12 DIAGNOSIS — C539 Malignant neoplasm of cervix uteri, unspecified: Secondary | ICD-10-CM | POA: Diagnosis not present

## 2020-02-12 DIAGNOSIS — Z923 Personal history of irradiation: Secondary | ICD-10-CM | POA: Diagnosis not present

## 2020-02-12 DIAGNOSIS — R918 Other nonspecific abnormal finding of lung field: Secondary | ICD-10-CM | POA: Diagnosis not present

## 2020-02-12 DIAGNOSIS — R911 Solitary pulmonary nodule: Secondary | ICD-10-CM

## 2020-02-12 NOTE — Progress Notes (Signed)
Radiation Oncology Follow up Note  Name: Mary Hoover   Date:   02/12/2020 MRN:  [id number] DOB: 21-Dec-1945    This 75 y.o. female presents to the clinic today for 1 month follow-up status post SBRT to right upper lobe for presumed stage I lung non-small cell lung cancer.  REFERRING PROVIDER: [name], DO  HPI: Patient is a 75 year old female previously treated for stage IIIc squamous cell carcinoma of the cervix now with a lesion in the right upper lobe 1 month out from SBRT for presumed stage I non-small cell lung cancer.  Seen today in routine follow-up she is doing well extremely low side effect profile she does have a cough which has been predated her treatment which is minor nonproductive.  She specifically denies any increased shortness of breath or dyspnea on exertion..  COMPLICATIONS OF TREATMENT: none  FOLLOW UP COMPLIANCE: keeps appointments   PHYSICAL EXAM:  BP 120/63 (BP Location: Left Arm, Patient Position: Sitting, Cuff Size: Normal)   Pulse 77   Temp (!) 97.4 F (36.3 C)   Resp 16   Wt 134 lb 3.2 oz (60.9 kg)   BMI 21.02 kg/m  Well-developed well-nourished patient in NAD. HEENT reveals PERLA, EOMI, discs not visualized.  Oral cavity is clear. No oral mucosal lesions are identified. Neck is clear without evidence of cervical or supraclavicular adenopathy. Lungs are clear to A&P. Cardiac examination is essentially unremarkable with regular rate and rhythm without murmur rub or thrill. Abdomen is benign with no organomegaly or masses noted. Motor sensory and DTR levels are equal and symmetric in the upper and lower extremities. Cranial nerves II through XII are grossly intact. Proprioception is intact. No peripheral adenopathy or edema is identified. No motor or sensory levels are noted. Crude visual fields are within normal range.  RADIOLOGY RESULTS: No current films for review  PLAN: Present time patient is doing well with extremely low side effect profile  from her SBRT.  I have asked to see her back in 3 months with a CT scan at that time.  Patient knows to call with any concerns.  I would like to take this opportunity to thank you for allowing me to participate in the care of your patient.Noreene Filbert, MD

## 2020-02-25 ENCOUNTER — Inpatient Hospital Stay: Payer: Medicare HMO

## 2020-03-17 ENCOUNTER — Encounter: Payer: Self-pay | Admitting: Obstetrics and Gynecology

## 2020-03-17 ENCOUNTER — Inpatient Hospital Stay: Payer: Medicare HMO | Attending: Obstetrics and Gynecology | Admitting: Obstetrics and Gynecology

## 2020-03-17 VITALS — BP 143/58 | HR 77 | Temp 97.5°F | Ht 68.0 in | Wt 125.7 lb

## 2020-03-17 DIAGNOSIS — Z08 Encounter for follow-up examination after completed treatment for malignant neoplasm: Secondary | ICD-10-CM | POA: Diagnosis not present

## 2020-03-17 DIAGNOSIS — I700 Atherosclerosis of aorta: Secondary | ICD-10-CM | POA: Diagnosis not present

## 2020-03-17 DIAGNOSIS — I2510 Atherosclerotic heart disease of native coronary artery without angina pectoris: Secondary | ICD-10-CM | POA: Diagnosis not present

## 2020-03-17 DIAGNOSIS — J449 Chronic obstructive pulmonary disease, unspecified: Secondary | ICD-10-CM | POA: Diagnosis not present

## 2020-03-17 DIAGNOSIS — Z79899 Other long term (current) drug therapy: Secondary | ICD-10-CM | POA: Diagnosis not present

## 2020-03-17 DIAGNOSIS — Z8541 Personal history of malignant neoplasm of cervix uteri: Secondary | ICD-10-CM | POA: Insufficient documentation

## 2020-03-17 DIAGNOSIS — E785 Hyperlipidemia, unspecified: Secondary | ICD-10-CM | POA: Diagnosis not present

## 2020-03-17 DIAGNOSIS — Z85118 Personal history of other malignant neoplasm of bronchus and lung: Secondary | ICD-10-CM | POA: Insufficient documentation

## 2020-03-17 DIAGNOSIS — Z923 Personal history of irradiation: Secondary | ICD-10-CM | POA: Insufficient documentation

## 2020-03-17 DIAGNOSIS — F17210 Nicotine dependence, cigarettes, uncomplicated: Secondary | ICD-10-CM | POA: Insufficient documentation

## 2020-03-17 DIAGNOSIS — M5431 Sciatica, right side: Secondary | ICD-10-CM | POA: Insufficient documentation

## 2020-03-17 DIAGNOSIS — M533 Sacrococcygeal disorders, not elsewhere classified: Secondary | ICD-10-CM | POA: Insufficient documentation

## 2020-03-17 DIAGNOSIS — Z9221 Personal history of antineoplastic chemotherapy: Secondary | ICD-10-CM | POA: Insufficient documentation

## 2020-03-17 DIAGNOSIS — Z7982 Long term (current) use of aspirin: Secondary | ICD-10-CM | POA: Insufficient documentation

## 2020-03-17 DIAGNOSIS — E039 Hypothyroidism, unspecified: Secondary | ICD-10-CM | POA: Diagnosis not present

## 2020-03-17 DIAGNOSIS — I10 Essential (primary) hypertension: Secondary | ICD-10-CM | POA: Insufficient documentation

## 2020-03-17 DIAGNOSIS — C539 Malignant neoplasm of cervix uteri, unspecified: Secondary | ICD-10-CM

## 2020-03-17 NOTE — Progress Notes (Signed)
[place]  Telephone:([phone] [phone]  Patient Care Team: [name], DO as PCP - General (Family Medicine) Clent Jacks, RN as Oncology Nurse Navigator Noreene Filbert, MD as Radiation Oncologist (Radiation Oncology)   Name of the patient: Mary Hoover  [id number]  [birth year]   Date of visit: 03/17/2020  Gynecologic Oncology Interval Visit   Referring Provider: Dr. Livingston Diones Ob-Gyn  Chief Complaint: Stage IIIC squamous Cell Cervical Cancer, surveillance  Subjective:  Mary Hoover is a 75 y.o. female initially diagnosed with stage IIIc cervical squamous cell carcinoma s/p concurrent carboplatin (02/20/19-03/20/19) & radiation (02/17/2019- 03/24/19) followed by vaginal brachytherapy at [facility] completed 04/11/2019 who returns to clinic for follow-up and continued surveillance.  Returns to clinic for surveillance.  Some intermittent pain in right SI joint and sciatica the last week or so.  Used heating pad and ice packs, but no pain meds.  No other complaints.   11/26/20 PET/CT for persistent right lung nodule.  Pelvis and abdomen were negative.   IMPRESSION: 1. Since the previous PET-CT there has been interval increase in size and degree of FDG uptake associated with the perifissural right upper lobe lung nodule which is suspicious for malignancy. Given the smoking related changes within the lungs findings may represent a primary bronchogenic carcinoma. Metastatic disease is less favored but not excluded. 2. Mild increased FDG uptake associated with bilateral hilar lymph nodes and left AP window lymph node. This is equivocal for nodal metastasis. 3. No signs of FDG avid solid organ or nodal metastasis within the abdomen or pelvis.  Status post SBRT 12/21 to right upper lobe for presumed stage I lung non-small cell lung cancer with Dr Baruch Gouty. She declined surgery.   06/23/202 CT Chest   IMPRESSION: 1. Spiculated right upper lobe nodule shows slight growth from 12/31/2018 and is new from 02/02/2015. Together with visualization on PET 01/08/2019, findings are worrisome for adenocarcinoma. 2. Aortic atherosclerosis (ICD10-I70.0). Coronary artery calcification. 3.  Emphysema (ICD10-J43.9).  Saw Dr Faith Rogue 4/21 and he discussed options including surveillance vs resection. She is reluctant to have surgery because half a lung would have to be removed and she is already having significant dyspnea due to COPD.  Has reduced smoking to half PPD. Dr Baruch Gouty suggests another CT in 3 months and if further growth he can treat this small area with radiation.  Gynecologic Oncology History  Mary Hoover is a pleasant y.o. female who is seen in consultation from Dr. Wynetta Emery for new diagnosis of probable cervical cancer. Patient initially presented to her PCP with postmenopausal vaginal bleeding.  She was referred to Dr. Glennon Mac at [facility].  Last menstrual period around age 77.  On exam, cervix was friable and firm to palpation extending anteriorly.  Urethral caruncle also noted.  Speculum exam caused some bleeding that required silver nitrate.  Pap and endometrial biopsy were performed.  Endometrial biopsy returned some exudative material.  Findings are concerning for neoplastic process.  Pap- 12/19/2018 - Squamous cell Carcinoma - High Risk HPV - negative  12/19/2018- Endometrial biopsy - Scattered atypical squamous cells suspicious for malignancy  - Predominantly necrosis with associated inflammation   CT Chest/Abdomen/Pelvis on 12/31/2018 for staging: Fluid distending the endometrial canal and/or endometrial thickening to the level of the cervix.  No adnexal mass or free fluid seen.  Haziness about the cervix of uncertain significance.  Chronic pleuroparenchymal scarring/fibrosis at lung apices, irregular pulmonary nodule within the Right upper lobe measuring 31m, moderate  emphysema.    01/08/2019 Cervical biopsy. DIAGNOSIS:  A. CERVIX; BIOPSY:  - AT LEAST HIGH-GRADE SQUAMOUS INTRAEPITHELIAL LESION (HSIL/CIN3).  Comment: Invasion is not definitively identified in this sample. Numerous step sections are examined. Given the exophytic growth pattern of the lesion, an underlying invasive component cannot be excluded. The diagnosis of squamous cell carcinoma on recent pap smear is noted. Immunohistochemical stain for p16 is negative.   01/08/2019 PET IMPRESSION:1. Marked hypermetabolism in the region of the cervix, compatible with the reported history of cervical cancer. 2. Small bilateral pelvic sidewall lymph nodes show discernible FDG accumulation, concerning for metastatic disease although neither lymph node is enlarged by CT size criteria. 3. 7 mm nodule in the right upper lobe shows discernible FDG accumulation. Given the discernible uptake in a tiny lung nodule of this size, neoplasm is a distinct consideration. Primary bronchogenic carcinoma or metastatic disease could have this appearance. 4.  Aortic Atherosclerois (ICD10-170.0) 5.  Emphysema. (GPQ98-Y64.9)  Stage IIIc cervical squamous cell carcinoma s/p concurrent carboplatin (02/20/19-03/20/19) & radiation (02/17/2019- 03/24/19) followed by vaginal brachytherapy at [facility] completed 04/11/2019 who returns to clinic for follow-up.   04/22/2019- CT Chest WO Contrast revealed stable right upper lobe lung nodule measuring 71m, previously given the discernible uptake on PET from 01/08/2019, in a tiny lung nodule of this size, neoplasm is a distinct consideration. Primary bronchogenic carcinoma or metastatic disease could have this appearance. Recommend continued serial follow up to ensure stability.   Colonoscopy: 2018 Mammogram:  Also s/p carotid end arterectomy for carotid stenosis.  Oncology History  [place]ical cancer ([facility]  02/03/2019 Initial Diagnosis   Malignant neoplasm of cervix ([facility]   02/20/2019 -  Chemotherapy   The  patient had palonosetron (ALOXI) injection 0.25 mg, 0.25 mg, Intravenous,  Once, 5 of 5 cycles Administration: 0.25 mg (02/20/2019), 0.25 mg (02/27/2019), 0.25 mg (03/06/2019), 0.25 mg (03/13/2019), 0.25 mg (03/20/2019) CARBOplatin (PARAPLATIN) 150 mg in sodium chloride 0.9 % 100 mL chemo infusion, 150 mg (100 % of original dose 152.8 mg), Intravenous,  Once, 5 of 5 cycles Dose modification:   (original dose 152.8 mg, Cycle 1) Administration: 150 mg (02/20/2019), 150 mg (02/27/2019), 150 mg (03/06/2019), 150 mg (03/13/2019), 150 mg (03/20/2019)  for chemotherapy treatment.     Problem List: Patient Active Problem List   Diagnosis Date Noted  . Encounter for tobacco use cessation counseling 03/13/2019  . Hyponatremia 02/27/2019  . Cervical cancer ([facility] 02/03/2019  . Goals of care, counseling/discussion 02/03/2019  . Carotid atherosclerosis 11/14/2016  . Myopia of both eyes 01/11/2016  . Lung nodules   . Essential hypertension 12/12/2014  . Hemoptysis 12/11/2014  . Medication monitoring encounter 11/25/2014  . Hyperlipidemia   . Status post carotid endarterectomy   . Tobacco use   . COPD (chronic obstructive pulmonary disease) ([facility] 10/13/2013  . Hypothyroidism 09/15/2013  . Peripheral vascular disease ([facility] 09/15/2013   Past Medical History: Past Medical History:  Diagnosis Date  . Cancer ([facility]   . Carotid atherosclerosis   . Carotid bruit   . COPD (chronic obstructive pulmonary disease) (H[facility])    emphysema  . Hyperlipidemia   . Hypertension   . Hyponatremia 02/27/2019  . Hypothyroidism   . Infected cat bite 1980s   was hospitalized  . Tobacco use    Past Surgical History: Past Surgical History:  Procedure Laterality Date  . CAROTID ENDARTERECTOMY Right Oct. 2015   Dr. DLucky Cowboy . CAROTID STENOSIS Left April 2016   carotid stenosis surgery  . COLONOSCOPY WITH PROPOFOL  N/A 11/08/2016   Procedure: COLONOSCOPY WITH PROPOFOL;  Surgeon: Jonathon Bellows, MD;  Location: [facility] ENDOSCOPY;  Service:  Gastroenterology;  Laterality: N/A;  . ESOPHAGOGASTRODUODENOSCOPY (EGD) WITH PROPOFOL N/A 11/08/2016   Procedure: ESOPHAGOGASTRODUODENOSCOPY (EGD) WITH PROPOFOL;  Surgeon: Jonathon Bellows, MD;  Location: [facility] ENDOSCOPY;  Service: Gastroenterology;  Laterality: N/A;  . FLEXIBLE BRONCHOSCOPY N/A 12/17/2014   Procedure: FLEXIBLE BRONCHOSCOPY;  Surgeon: Wilhelmina Mcardle, MD;  Location: [facility] ORS;  Service: Pulmonary;  Laterality: N/A;  . INCISION AND DRAINAGE / EXCISION THYROGLOSSAL CYST  April 2011   OB/GYN History:  She underwent menopause many years ago, no HRT.  Had 5 children. Not sexually active, husband had prostate cancer.    Family History: Family History  Problem Relation Age of Onset  . Congestive Heart Failure Mother   . Heart disease Mother   . Hypertension Mother   . Hypertension Sister   . Diabetes Sister   . Heart disease Sister   . Cancer Brother        liver, lung  . Heart disease Maternal Grandmother   . Heart disease Maternal Uncle   . Stroke Maternal Grandfather   . Heart disease Brother   . Hypertension Brother   . Heart attack Brother   . COPD Neg Hx     Social History: Social History   Socioeconomic History  . Marital status: Married    Spouse name: Not on file  . Number of children: Not on file  . Years of education: Not on file  . Highest education level: 9th grade  Occupational History  . Occupation: retired   Tobacco Use  . Smoking status: Current Every Day Smoker    Packs/day: 0.50    Years: 50.00    Pack years: 25.00    Types: Cigarettes  . Smokeless tobacco: Never Used  . Tobacco comment: cutting back, not ready to quit   Vaping Use  . Vaping Use: Former  Substance and Sexual Activity  . Alcohol use: No    Alcohol/week: 0.0 standard drinks  . Drug use: No  . Sexual activity: Yes  Other Topics Concern  . Not on file  Social History Narrative   Lives at home with husband   Social Determinants of Health   Financial Resource Strain: Not  on file  Food Insecurity: Not on file  Transportation Needs: Not on file  Physical Activity: Not on file  Stress: Not on file  Social Connections: Not on file  Intimate Partner Violence: Not on file   Allergies: Allergies  Allergen Reactions  . Atorvastatin Other (See Comments)    dizziness   Current Medications: Current Outpatient Medications  Medication Sig Dispense Refill  . ASPIRIN 81 PO Take by mouth daily. 81 mg tablet    . budesonide-formoterol (SYMBICORT) 160-4.5 MCG/ACT inhaler Inhale 2 puffs into the lungs 2 (two) times daily. 1 Inhaler 12  . clopidogrel (PLAVIX) 75 MG tablet Take 1 tablet (75 mg total) by mouth daily. 90 tablet 3  . diphenhydrAMINE (BENADRYL) 25 mg capsule Take 25 mg by mouth at bedtime as needed.    . ferrous sulfate 325 (65 FE) MG EC tablet Take 1 tablet (325 mg total) by mouth daily with breakfast. 90 tablet 4  . levothyroxine (SYNTHROID) 75 MCG tablet TAKE 1 TABLET BY MOUTH  DAILY 60 tablet 5  . lisinopril (ZESTRIL) 20 MG tablet TAKE 1 TABLET BY MOUTH  DAILY 90 tablet 0  . Multiple Vitamin (MULTIVITAMIN) tablet Take 1 tablet by mouth daily.    Marland Kitchen  prochlorperazine (COMPAZINE) 10 MG tablet Take 1 tablet (10 mg total) by mouth every 6 (six) hours as needed (Nausea or vomiting). 30 tablet 1  . vitamin B-12 (CYANOCOBALAMIN) 500 MCG tablet Take 500 mcg by mouth daily.     No current facility-administered medications for this visit.   Review of Systems  Objective:  Physical Examination:  Vitals:   03/17/20 1146  BP: (!) 143/58  Pulse: 77  Temp: (!) 97.5 F (36.4 C)   ECOG Performance Status: 1 - Symptomatic but completely ambulatory   EXAM 04/23/2019 GENERAL: Patient is a well appearing female in no acute distress HEENT:  PERRL, neck supple with midline trachea. Thyroid without masses.  NODES:  No cervical, supraclavicular, axillary, or inguinal lymphadenopathy palpated.  LUNGS:  Clear to auscultation bilaterally.  No wheezes or rhonchi. HEART:   Regular rate and rhythm. No murmur appreciated. ABDOMEN:  Soft, nontender.  Positive, normoactive bowel sounds.  EXTREMITIES:  No peripheral edema.   SKIN:  Clear with no obvious rashes or skin changes. No nail dyscrasia. NEURO:  Nonfocal. Well oriented.  Appropriate affect.  PELVIC: exam chaperoned by nurse;   Vulva: normal appearing vulva with no masses, tenderness or lesions;  Vagina: atrophic, no discharge.  Cervix: The cervix is atrophic and small with no lesions seen Bimanual/RV: no masses nodularity  Lab Review n/a  Assessment:  Mary Hoover is a 75 y.o. female diagnosed with probably stage IIIc (T1b N1 Mx) squamous cell cancer of the cervix. On PET/CT large primary cervical cancer and small bilateral pelvic sidewall lymph nodes show discernible FDG accumulation.  Treated with external radiation and carboplatin at [facility] and brachytherapy at [facility] and completed 03/2019. NED on exam today.    Indeterminate 7 mm FDG active right upper lobe lung nodule, concerning for primary bronchogenic carcinoma.  11/27/19 PET/CT for persistent right lung nodule shows continued growth of PET positive lesion.  Pelvis and abdomen were negative. Status post SBRT 12/21 to right upper lobe for presumed stage I lung non-small cell lung cancer with Dr Baruch Gouty. She declined lung resection surgery.   Returns to clinic for surveillance.  Some intermittent pain in right SI joint and sciatica the last week or so.  No leg swelling.  Used heating pad and ice packs, but no pain meds.  No other complaints.   Medical co-morbidities complicating care: heavy smoker/emphysema, vascular disease s/p carotid endarterectomy.  Plan:   Problem List Items Addressed This Visit      Genitourinary   Cervical cancer ([place]) - Primary     PAP done. She will use NSAIDs for right SI joint pain and follow up with primary care. Doubt that she has recurrence of cervical cancer in view of normal exam and negative PET in  10/21.  If pain does not improve with treatment for DJD will order CT scan to rule out recurrence of cervical cancer.   RTC 4 months or sooner if SI joint pain continues.      Follow up with Dr Baruch Gouty for lung cancer.   Mellody Drown, MD

## 2020-03-22 LAB — IGP, APTIMA HPV: HPV Aptima: NEGATIVE

## 2020-03-23 ENCOUNTER — Telehealth: Payer: Self-pay

## 2020-03-23 NOTE — Telephone Encounter (Signed)
Attempted to call Mary Hoover with negative pap smear results. She is currently sleeping per her spouse.

## 2020-04-19 ENCOUNTER — Telehealth: Payer: Self-pay | Admitting: Family Medicine

## 2020-04-19 MED ORDER — CLOPIDOGREL BISULFATE 75 MG PO TABS: 75.0000 mg | ORAL_TABLET | Freq: Every day | ORAL | 1 refills | Status: DC

## 2020-04-19 NOTE — Telephone Encounter (Signed)
Medication: clopidogrel (PLAVIX) 75 MG tablet [[id number]] ,   Has the patient contacted their pharmacy? YES  (Agent: If no, request that the patient contact the pharmacy for the refill.) (Agent: If yes, when and what did the pharmacy advise?)  Preferred Pharmacy (with phone number or street name): [facility] [place], [place] [phone]  Agent: Please be advised that RX refills may take up to 3 business days. We ask that you follow-up with your pharmacy.

## 2020-04-20 NOTE — Telephone Encounter (Signed)
Called and left a message letting patient know that the medication was sent to [facility]

## 2020-04-20 NOTE — Telephone Encounter (Signed)
Pt wanted a follow up call regarding why her meds have not been filled. Pt stated she is running out and wants a call back.

## 2020-04-27 ENCOUNTER — Other Ambulatory Visit: Payer: Self-pay

## 2020-04-27 NOTE — Telephone Encounter (Signed)
Switch in pharmacy

## 2020-04-29 ENCOUNTER — Other Ambulatory Visit: Payer: Self-pay

## 2020-05-02 MED ORDER — LEVOTHYROXINE SODIUM 75 MCG PO TABS: 75.0000 ug | ORAL_TABLET | Freq: Every day | ORAL | 5 refills | Status: DC

## 2020-05-02 MED ORDER — LISINOPRIL 20 MG PO TABS: 20.0000 mg | ORAL_TABLET | Freq: Every day | ORAL | 1 refills | Status: DC

## 2020-05-02 MED ORDER — BUDESONIDE-FORMOTEROL FUMARATE 160-4.5 MCG/ACT IN AERO: 2.0000 | INHALATION_SPRAY | Freq: Two times a day (BID) | RESPIRATORY_TRACT | 12 refills | Status: DC

## 2020-05-14 ENCOUNTER — Ambulatory Visit: Payer: Medicare Other | Admitting: Family Medicine

## 2020-05-17 ENCOUNTER — Other Ambulatory Visit: Payer: Self-pay

## 2020-05-17 ENCOUNTER — Ambulatory Visit
Admission: RE | Admit: 2020-05-17 | Discharge: 2020-05-17 | Disposition: A | Discharge status: Home / Self Care | Payer: Medicare HMO | Source: Ambulatory Visit | Attending: Radiation Oncology | Admitting: Radiation Oncology

## 2020-05-17 DIAGNOSIS — Z8541 Personal history of malignant neoplasm of cervix uteri: Secondary | ICD-10-CM | POA: Diagnosis not present

## 2020-05-17 DIAGNOSIS — J432 Centrilobular emphysema: Secondary | ICD-10-CM | POA: Diagnosis not present

## 2020-05-17 DIAGNOSIS — R911 Solitary pulmonary nodule: Secondary | ICD-10-CM | POA: Insufficient documentation

## 2020-05-17 DIAGNOSIS — C3411 Malignant neoplasm of upper lobe, right bronchus or lung: Secondary | ICD-10-CM | POA: Diagnosis not present

## 2020-05-17 DIAGNOSIS — I2510 Atherosclerotic heart disease of native coronary artery without angina pectoris: Secondary | ICD-10-CM | POA: Diagnosis not present

## 2020-05-17 LAB — POCT I-STAT CREATININE: Creatinine, Ser: 0.9 mg/dL (ref 0.44–1.00)

## 2020-05-17 MED ORDER — IOHEXOL 300 MG/ML  SOLN
75.0000 mL | Freq: Once | INTRAMUSCULAR | Status: AC | PRN
  Administered 2020-05-17: 75 mL via INTRAVENOUS

## 2020-05-20 ENCOUNTER — Encounter: Payer: Self-pay | Admitting: Family Medicine

## 2020-05-20 ENCOUNTER — Ambulatory Visit
Admission: RE | Admit: 2020-05-20 | Discharge: 2020-05-20 | Disposition: A | Discharge status: Home / Self Care | Payer: Medicare HMO | Attending: Family Medicine | Admitting: Family Medicine

## 2020-05-20 ENCOUNTER — Ambulatory Visit
Admission: RE | Admit: 2020-05-20 | Discharge: 2020-05-20 | Disposition: A | Discharge status: Home / Self Care | Payer: Medicare HMO | Source: Ambulatory Visit | Attending: Family Medicine | Admitting: Family Medicine

## 2020-05-20 ENCOUNTER — Ambulatory Visit (INDEPENDENT_AMBULATORY_CARE_PROVIDER_SITE_OTHER): Payer: Medicare HMO | Admitting: Family Medicine

## 2020-05-20 ENCOUNTER — Other Ambulatory Visit: Payer: Self-pay

## 2020-05-20 VITALS — BP 159/64 | HR 69 | Temp 98.0°F | Wt 127.4 lb

## 2020-05-20 DIAGNOSIS — Z720 Tobacco use: Secondary | ICD-10-CM

## 2020-05-20 DIAGNOSIS — M5442 Lumbago with sciatica, left side: Secondary | ICD-10-CM | POA: Diagnosis not present

## 2020-05-20 DIAGNOSIS — J449 Chronic obstructive pulmonary disease, unspecified: Secondary | ICD-10-CM | POA: Diagnosis not present

## 2020-05-20 DIAGNOSIS — E782 Mixed hyperlipidemia: Secondary | ICD-10-CM | POA: Diagnosis not present

## 2020-05-20 DIAGNOSIS — I6523 Occlusion and stenosis of bilateral carotid arteries: Secondary | ICD-10-CM

## 2020-05-20 DIAGNOSIS — I10 Essential (primary) hypertension: Secondary | ICD-10-CM

## 2020-05-20 DIAGNOSIS — Z1231 Encounter for screening mammogram for malignant neoplasm of breast: Secondary | ICD-10-CM

## 2020-05-20 DIAGNOSIS — Z0000 Encounter for general adult medical examination without abnormal findings: Secondary | ICD-10-CM

## 2020-05-20 DIAGNOSIS — M545 Low back pain, unspecified: Secondary | ICD-10-CM | POA: Diagnosis not present

## 2020-05-20 DIAGNOSIS — E034 Atrophy of thyroid (acquired): Secondary | ICD-10-CM | POA: Diagnosis not present

## 2020-05-20 DIAGNOSIS — I739 Peripheral vascular disease, unspecified: Secondary | ICD-10-CM | POA: Diagnosis not present

## 2020-05-20 DIAGNOSIS — C539 Malignant neoplasm of cervix uteri, unspecified: Secondary | ICD-10-CM | POA: Diagnosis not present

## 2020-05-20 DIAGNOSIS — Z13820 Encounter for screening for osteoporosis: Secondary | ICD-10-CM

## 2020-05-20 MED ORDER — CYCLOBENZAPRINE HCL 10 MG PO TABS: 10.0000 mg | ORAL_TABLET | Freq: Every day | ORAL | 1 refills | Status: DC

## 2020-05-20 MED ORDER — TRELEGY ELLIPTA 100-62.5-25 MCG/INH IN AEPB: 1.0000 | INHALATION_SPRAY | Freq: Every day | RESPIRATORY_TRACT | 11 refills | Status: DC

## 2020-05-20 MED ORDER — KETOROLAC TROMETHAMINE 60 MG/2ML IM SOLN
60.0000 mg | Freq: Once | INTRAMUSCULAR | Status: AC
  Administered 2020-05-20: 60 mg via INTRAMUSCULAR

## 2020-05-20 MED ORDER — NAPROXEN 500 MG PO TABS
500.0000 mg | ORAL_TABLET | Freq: Two times a day (BID) | ORAL | 1 refills | Status: DC
Start: 2020-05-20 — End: 2020-07-08

## 2020-05-20 NOTE — Patient Instructions (Signed)
Health Maintenance After Age 75 After age 7, you are at a higher risk for certain long-term diseases and infections as well as injuries from falls. Falls are a major cause of broken bones and head injuries in people who are older than age 53. Getting regular preventive care can help to keep you healthy and well. Preventive care includes getting regular testing and making lifestyle changes as recommended by your health care provider. Talk with your health care provider about:  Which screenings and tests you should have. A screening is a test that checks for a disease when you have no symptoms.  A diet and exercise plan that is right for you. What should I know about screenings and tests to prevent falls? Screening and testing are the best ways to find a health problem early. Early diagnosis and treatment give you the best chance of managing medical conditions that are common after age 94. Certain conditions and lifestyle choices may make you more likely to have a fall. Your health care provider may recommend:  Regular vision checks. Poor vision and conditions such as cataracts can make you more likely to have a fall. If you wear glasses, make sure to get your prescription updated if your vision changes.  Medicine review. Work with your health care provider to regularly review all of the medicines you are taking, including over-the-counter medicines. Ask your health care provider about any side effects that may make you more likely to have a fall. Tell your health care provider if any medicines that you take make you feel dizzy or sleepy.  Osteoporosis screening. Osteoporosis is a condition that causes the bones to get weaker. This can make the bones weak and cause them to break more easily.  Blood pressure screening. Blood pressure changes and medicines to control blood pressure can make you feel dizzy.  Strength and balance checks. Your health care provider may recommend certain tests to check your  strength and balance while standing, walking, or changing positions.  Foot health exam. Foot pain and numbness, as well as not wearing proper footwear, can make you more likely to have a fall.  Depression screening. You may be more likely to have a fall if you have a fear of falling, feel emotionally low, or feel unable to do activities that you used to do.  Alcohol use screening. Using too much alcohol can affect your balance and may make you more likely to have a fall. What actions can I take to lower my risk of falls? General instructions  Talk with your health care provider about your risks for falling. Tell your health care provider if: ? You fall. Be sure to tell your health care provider about all falls, even ones that seem minor. ? You feel dizzy, sleepy, or off-balance.  Take over-the-counter and prescription medicines only as told by your health care provider. These include any supplements.  Eat a healthy diet and maintain a healthy weight. A healthy diet includes low-fat dairy products, low-fat (lean) meats, and fiber from whole grains, beans, and lots of fruits and vegetables. Home safety  Remove any tripping hazards, such as rugs, cords, and clutter.  Install safety equipment such as grab bars in bathrooms and safety rails on stairs.  Keep rooms and walkways well-lit. Activity  Follow a regular exercise program to stay fit. This will help you maintain your balance. Ask your health care provider what types of exercise are appropriate for you.  If you need a cane or walker,  use it as recommended by your health care provider.  Wear supportive shoes that have nonskid soles.   Lifestyle  Do not drink alcohol if your health care provider tells you not to drink.  If you drink alcohol, limit how much you have: ? 0-1 drink a day for women. ? 0-2 drinks a day for men.  Be aware of how much alcohol is in your drink. In the [place] one drink equals one typical bottle of beer (12  oz), one-half glass of wine (5 oz), or one shot of hard liquor (1 oz).  Do not use any products that contain nicotine or tobacco, such as cigarettes and e-cigarettes. If you need help quitting, ask your health care provider. Summary  Having a healthy lifestyle and getting preventive care can help to protect your health and wellness after age 62.  Screening and testing are the best way to find a health problem early and help you avoid having a fall. Early diagnosis and treatment give you the best chance for managing medical conditions that are more common for people who are older than age 41.  Falls are a major cause of broken bones and head injuries in people who are older than age 21. Take precautions to prevent a fall at home.  Work with your health care provider to learn what changes you can make to improve your health and wellness and to prevent falls. This information is not intended to replace advice given to you by your health care provider. Make sure you discuss any questions you have with your health care provider. Document Revised: 05/09/2018 Document Reviewed: 11/29/2016 Elsevier Patient Education  2021 [place] [facility] Ask your health care provider which exercises are safe for you. Do exercises exactly as told by your health care provider and adjust them as directed. It is normal to feel mild stretching, pulling, tightness, or discomfort as you do these exercises. Stop right away if you feel sudden pain or your pain gets worse. Do not begin these exercises until told by your health care provider. Stretching and range-of-motion exercises These exercises warm up your muscles and joints and improve the movement and flexibility of your back. These exercises also help to relieve pain, numbness, and tingling. Lumbar rotation 1. Lie on your back on a firm surface and bend your knees. 2. Straighten your arms out to your sides so each arm forms a 90-degree  angle (right angle) with a side of your body. 3. Slowly move (rotate) both of your knees to one side of your body until you feel a stretch in your lower back (lumbar). Try not to let your shoulders lift off the floor. 4. Hold this position for __________ seconds. 5. Tense your abdominal muscles and slowly move your knees back to the starting position. 6. Repeat this exercise on the other side of your body. Repeat __________ times. Complete this exercise __________ times a day.   Single knee to chest 1. Lie on your back on a firm surface with both legs straight. 2. Bend one of your knees. Use your hands to move your knee up toward your chest until you feel a gentle stretch in your lower back and buttock. ? Hold your leg in this position by holding on to the front of your knee. ? Keep your other leg as straight as possible. 3. Hold this position for __________ seconds. 4. Slowly return to the starting position. 5. Repeat with your other leg. Repeat  __________ times. Complete this exercise __________ times a day.   Prone extension on elbows 1. Lie on your abdomen on a firm surface (prone position). 2. Prop yourself up on your elbows. 3. Use your arms to help lift your chest up until you feel a gentle stretch in your abdomen and your lower back. ? This will place some of your body weight on your elbows. If this is uncomfortable, try stacking pillows under your chest. ? Your hips should stay down, against the surface that you are lying on. Keep your hip and back muscles relaxed. 4. Hold this position for __________ seconds. 5. Slowly relax your upper body and return to the starting position. Repeat __________ times. Complete this exercise __________ times a day.   Strengthening exercises These exercises build strength and endurance in your back. Endurance is the ability to use your muscles for a long time, even after they get tired. Pelvic tilt This exercise strengthens the muscles that lie  deep in the abdomen. 1. Lie on your back on a firm surface. Bend your knees and keep your feet flat on the floor. 2. Tense your abdominal muscles. Tip your pelvis up toward the ceiling and flatten your lower back into the floor. ? To help with this exercise, you may place a small towel under your lower back and try to push your back into the towel. 3. Hold this position for __________ seconds. 4. Let your muscles relax completely before you repeat this exercise. Repeat __________ times. Complete this exercise __________ times a day. Alternating arm and leg raises 1. Get on your hands and knees on a firm surface. If you are on a hard floor, you may want to use padding, such as an exercise mat, to cushion your knees. 2. Line up your arms and legs. Your hands should be directly below your shoulders, and your knees should be directly below your hips. 3. Lift your left leg behind you. At the same time, raise your right arm and straighten it in front of you. ? Do not lift your leg higher than your hip. ? Do not lift your arm higher than your shoulder. ? Keep your abdominal and back muscles tight. ? Keep your hips facing the ground. ? Do not arch your back. ? Keep your balance carefully, and do not hold your breath. 4. Hold this position for __________ seconds. 5. Slowly return to the starting position. 6. Repeat with your right leg and your left arm. Repeat __________ times. Complete this exercise __________ times a day.   Abdominal set with straight leg raise 1. Lie on your back on a firm surface. 2. Bend one of your knees and keep your other leg straight. 3. Tense your abdominal muscles and lift your straight leg up, 4-6 inches (10-15 cm) off the ground. 4. Keep your abdominal muscles tight and hold this position for __________ seconds. ? Do not hold your breath. ? Do not arch your back. Keep it flat against the ground. 5. Keep your abdominal muscles tense as you slowly lower your leg back to  the starting position. 6. Repeat with your other leg. Repeat __________ times. Complete this exercise __________ times a day.   Single leg lower with bent knees 1. Lie on your back on a firm surface. 2. Tense your abdominal muscles and lift your feet off the floor, one foot at a time, so your knees and hips are bent in 90-degree angles (right angles). ? Your knees should be over your  hips and your lower legs should be parallel to the floor. 3. Keeping your abdominal muscles tense and your knee bent, slowly lower one of your legs so your toe touches the ground. 4. Lift your leg back up to return to the starting position. ? Do not hold your breath. ? Do not let your back arch. Keep your back flat against the ground. 5. Repeat with your other leg. Repeat __________ times. Complete this exercise __________ times a day. Posture and body mechanics Good posture and healthy body mechanics can help to relieve stress in your body's tissues and joints. Body mechanics refers to the movements and positions of your body while you do your daily activities. Posture is part of body mechanics. Good posture means:  Your spine is in its natural S-curve position (neutral).  Your shoulders are pulled back slightly.  Your head is not tipped forward. Follow these guidelines to improve your posture and body mechanics in your everyday activities. Standing  When standing, keep your spine neutral and your feet about hip width apart. Keep a slight bend in your knees. Your ears, shoulders, and hips should line up.  When you do a task in which you stand in one place for a long time, place one foot up on a stable object that is 2-4 inches (5-10 cm) high, such as a footstool. This helps keep your spine neutral.   Sitting  When sitting, keep your spine neutral and keep your feet flat on the floor. Use a footrest, if necessary, and keep your thighs parallel to the floor. Avoid rounding your shoulders, and avoid tilting  your head forward.  When working at a desk or a computer, keep your desk at a height where your hands are slightly lower than your elbows. Slide your chair under your desk so you are close enough to maintain good posture.  When working at a computer, place your monitor at a height where you are looking straight ahead and you do not have to tilt your head forward or downward to look at the screen.   Resting  When lying down and resting, avoid positions that are most painful for you.  If you have pain with activities such as sitting, bending, stooping, or squatting, lie in a position in which your body does not bend very much. For example, avoid curling up on your side with your arms and knees near your chest (fetal position).  If you have pain with activities such as standing for a long time or reaching with your arms, lie with your spine in a neutral position and bend your knees slightly. Try the following positions: ? Lying on your side with a pillow between your knees. ? Lying on your back with a pillow under your knees. Lifting  When lifting objects, keep your feet at least shoulder width apart and tighten your abdominal muscles.  Bend your knees and hips and keep your spine neutral. It is important to lift using the strength of your legs, not your back. Do not lock your knees straight out.  Always ask for help to lift heavy or awkward objects.   This information is not intended to replace advice given to you by your health care provider. Make sure you discuss any questions you have with your health care provider. Document Revised: 05/10/2018 Document Reviewed: 02/07/2018 Else[employer] Patient Education  [place].

## 2020-05-20 NOTE — Progress Notes (Signed)
BP (!) 159/64   Pulse 69   Temp 98 F (36.7 C)   Wt 127 lb 6.4 oz (57.8 kg)   SpO2 99%   BMI 19.37 kg/m    Subjective:    Patient ID: Mary Hoover, female    DOB: November 20, 1945, 76 y.o.   MRN: [id number]  HPI: Mary Hoover is a 75 y.o. female presenting on 05/20/2020 for comprehensive medical examination. Current medical complaints include:  BACK PAIN Duration: about month and a half Mechanism of injury: unknown Location: bilateral and low back Onset: gradual Severity: severe Quality: aching and sharp Frequency: constant Radiation: L leg above the knee Aggravating factors: getting up, moving Alleviating factors: nothing Status: worse Treatments attempted: rest, ice, heat and ibuprofen  Relief with NSAIDs?: mild Nighttime pain:  no Paresthesias / decreased sensation:  no Bowel / bladder incontinence:  no Fevers:  no Dysuria / urinary frequency:  no  HYPERTENSION / HYPERLIPIDEMIA Satisfied with current treatment? yes Duration of hypertension: chronic BP monitoring frequency: not checking BP range:  BP medication side effects: no Past BP meds: lisinopril Duration of hyperlipidemia: chronic Cholesterol medication side effects: not on anything Cholesterol supplements: none Past cholesterol medications: not on anything Medication compliance: excellent compliance Aspirin: no Recent stressors: no Recurrent headaches: no Visual changes: no Palpitations: no Dyspnea: no Chest pain: no Lower extremity edema: no Dizzy/lightheaded: no  COPD COPD status: stable Satisfied with current treatment?: yes Oxygen use: no Dyspnea frequency: with activity Cough frequency: in the morning Rescue inhaler frequency:  occasionally Limitation of activity: yes Productive cough: no  Pneumovax: Up to Date Influenza: Up to Date  HYPOTHYROIDISM Thyroid control status:unsure Satisfied with current treatment? yes Medication side effects: no Medication compliance:  excellent compliance Recent dose adjustment:no Fatigue: yes Cold intolerance: no Heat intolerance: no Weight gain: no Weight loss: no Constipation: no Diarrhea/loose stools: no Palpitations: no Lower extremity edema: no Anxiety/depressed mood: no  She currently lives with: husband Menopausal Symptoms: no  Depression Screen done today and results listed below:  Depression screen [facility] 2/9 05/20/2020 04/30/2019 07/11/2018 04/25/2018 04/09/2017  Decreased Interest 1 0 0 0 0  Down, Depressed, Hopeless 0 1 0 0 0  PHQ - 2 Score 1 1 0 0 0  Altered sleeping - - 0 - -  Tired, decreased energy - - 0 - -  Change in appetite - - 0 - -  Feeling bad or failure about yourself  - - 0 - -  Trouble concentrating - - 0 - -  Moving slowly or fidgety/restless - - 0 - -  Suicidal thoughts - - 0 - -  PHQ-9 Score - - 0 - -  Difficult doing work/chores - - Not difficult at all - -    Past Medical History:  Past Medical History:  Diagnosis Date  . Cancer ([place])   . Carotid atherosclerosis   . Carotid bruit   . COPD (chronic obstructive pulmonary disease) (HCC)    emphysema  . Hyperlipidemia   . Hypertension   . Hyponatremia 02/27/2019  . Hypothyroidism   . Infected cat bite 1980s   was hospitalized  . Tobacco use     Surgical History:  Past Surgical History:  Procedure Laterality Date  . CAROTID ENDARTERECTOMY Right Oct. 2015   Dr. Lucky Cowboy  . CAROTID STENOSIS Left April 2016   carotid stenosis surgery  . COLONOSCOPY WITH PROPOFOL N/A 11/08/2016   Procedure: COLONOSCOPY WITH PROPOFOL;  Surgeon: Jonathon Bellows, MD;  Location: [place];  Service: Gastroenterology;  Laterality: N/A;  . ESOPHAGOGASTRODUODENOSCOPY (EGD) WITH PROPOFOL N/A 11/08/2016   Procedure: ESOPHAGOGASTRODUODENOSCOPY (EGD) WITH PROPOFOL;  Surgeon: Anna, Kiran, MD;  Location: [facility];  Service: Gastroenterology;  Laterality: N/A;  . FLEXIBLE BRONCHOSCOPY N/A 12/17/2014   Procedure: FLEXIBLE BRONCHOSCOPY;  Surgeon: David  B Simonds, MD;  Location: [facility];  Service: Pulmonary;  Laterality: N/A;  . INCISION AND DRAINAGE / EXCISION THYROGLOSSAL CYST  April 2011    Medications:  Current Outpatient Medications on File Prior to Visit  Medication Sig  . clopidogrel (PLAVIX) 75 MG tablet Take 1 tablet (75 mg total) by mouth daily.  . diphenhydrAMINE (BENADRYL) 25 mg capsule Take 25 mg by mouth at bedtime as needed.  . ferrous sulfate 325 (65 FE) MG EC tablet Take 1 tablet (325 mg total) by mouth daily with breakfast.  . levothyroxine (SYNTHROID) 75 MCG tablet Take 1 tablet (75 mcg total) by mouth daily.  . lisinopril (ZESTRIL) 20 MG tablet Take 1 tablet (20 mg total) by mouth daily.  . Multiple Vitamin (MULTIVITAMIN) tablet Take 1 tablet by mouth daily.  . vitamin B-12 (CYANOCOBALAMIN) 500 MCG tablet Take 500 mcg by mouth daily.  . ASPIRIN 81 PO Take by mouth daily. 81 mg tablet (Patient not taking: Reported on 05/20/2020)  . prochlorperazine (COMPAZINE) 10 MG tablet Take 1 tablet (10 mg total) by mouth every 6 (six) hours as needed (Nausea or vomiting). (Patient not taking: Reported on 05/20/2020)   No current facility-administered medications on file prior to visit.    Allergies:  Allergies  Allergen Reactions  . Atorvastatin Other (See Comments)    dizziness    Social History:  Social History   Socioeconomic History  . Marital status: Married    Spouse name: Not on file  . Number of children: Not on file  . Years of education: Not on file  . Highest education level: 9th grade  Occupational History  . Occupation: retired   Tobacco Use  . Smoking status: Current Every Day Smoker    Packs/day: 0.50    Years: 50.00    Pack years: 25.00    Types: Cigarettes  . Smokeless tobacco: Never Used  . Tobacco comment: cutting back, not ready to quit   Vaping Use  . Vaping Use: Former  Substance and Sexual Activity  . Alcohol use: No    Alcohol/week: 0.0 standard drinks  . Drug use: No  . Sexual  activity: Yes  Other Topics Concern  . Not on file  Social History Narrative   Lives at home with husband   Social Determinants of Health   Financial Resource Strain: Not on file  Food Insecurity: Not on file  Transportation Needs: Not on file  Physical Activity: Not on file  Stress: Not on file  Social Connections: Not on file  Intimate Partner Violence: Not on file   Social History   Tobacco Use  Smoking Status Current Every Day Smoker  . Packs/day: 0.50  . Years: 50.00  . Pack years: 25.00  . Types: Cigarettes  Smokeless Tobacco Never Used  Tobacco Comment   cutting back, not ready to quit    Social History   Substance and Sexual Activity  Alcohol Use No  . Alcohol/week: 0.0 standard drinks    Family History:  Family History  Problem Relation Age of Onset  . Congestive Heart Failure Mother   . Heart disease Mother   . Hypertension Mother   . Hypertension Sister   . Diabetes   Sister   . Heart disease Sister   . Cancer Brother        liver, lung  . Heart disease Maternal Grandmother   . Heart disease Maternal Uncle   . Stroke Maternal Grandfather   . Heart disease Brother   . Hypertension Brother   . Heart attack Brother   . COPD Neg Hx     Past medical history, surgical history, medications, allergies, family history and social history reviewed with patient today and changes made to appropriate areas of the chart.   Review of Systems  Constitutional: Negative.   HENT: Negative.   Eyes: Negative.   Respiratory: Positive for cough and shortness of breath. Negative for hemoptysis, sputum production and wheezing.   Cardiovascular: Negative.   Gastrointestinal: Positive for diarrhea. Negative for abdominal pain, blood in stool, constipation, heartburn, melena, nausea and vomiting.  Genitourinary: Negative.   Musculoskeletal: Positive for back pain. Negative for falls, joint pain, myalgias and neck pain.  Skin: Negative.   Neurological: Positive for  dizziness. Negative for tingling, tremors, sensory change, speech change, focal weakness, seizures, loss of consciousness, weakness and headaches.  Endo/Heme/Allergies: Negative for environmental allergies and polydipsia. Bruises/bleeds easily.  Psychiatric/Behavioral: Negative.    All other ROS negative except what is listed above and in the HPI.      Objective:    BP (!) 159/64   Pulse 69   Temp 98 F (36.7 C)   Wt 127 lb 6.4 oz (57.8 kg)   SpO2 99%   BMI 19.37 kg/m   Wt Readings from Last 3 Encounters:  05/20/20 127 lb 6.4 oz (57.8 kg)  03/17/20 125 lb 11.2 oz (57 kg)  02/12/20 134 lb 3.2 oz (60.9 kg)    Physical Exam Vitals and nursing note reviewed.  Constitutional:      General: She is not in acute distress.    Appearance: Normal appearance. She is not ill-appearing, toxic-appearing or diaphoretic.  HENT:     Head: Normocephalic and atraumatic.     Right Ear: Tympanic membrane, ear canal and external ear normal. There is no impacted cerumen.     Left Ear: Tympanic membrane, ear canal and external ear normal. There is no impacted cerumen.     Nose: Nose normal. No congestion or rhinorrhea.     Mouth/Throat:     Mouth: Mucous membranes are moist.     Pharynx: Oropharynx is clear. No oropharyngeal exudate or posterior oropharyngeal erythema.  Eyes:     General: No scleral icterus.       Right eye: No discharge.        Left eye: No discharge.     Extraocular Movements: Extraocular movements intact.     Conjunctiva/sclera: Conjunctivae normal.     Pupils: Pupils are equal, round, and reactive to light.  Neck:     Vascular: No carotid bruit.  Cardiovascular:     Rate and Rhythm: Normal rate and regular rhythm.     Pulses: Normal pulses.     Heart sounds: No murmur heard. No friction rub. No gallop.   Pulmonary:     Effort: Pulmonary effort is normal. No respiratory distress.     Breath sounds: Normal breath sounds. No stridor. No wheezing, rhonchi or rales.  Chest:      Chest wall: No tenderness.  Abdominal:     General: Abdomen is flat. Bowel sounds are normal. There is no distension.     Palpations: Abdomen is soft. There is no mass.  Tenderness: There is no abdominal tenderness. There is no right CVA tenderness, left CVA tenderness, guarding or rebound.     Hernia: No hernia is present.  Genitourinary:    Comments: Breast and pelvic exams deferred with shared decision making Musculoskeletal:        General: No swelling, tenderness, deformity or signs of injury.     Cervical back: Normal range of motion and neck supple. No rigidity. No muscular tenderness.     Right lower leg: No edema.     Left lower leg: No edema.     Comments: Hypertonic paraspinal muscles in her lumbar  Lymphadenopathy:     Cervical: No cervical adenopathy.  Skin:    General: Skin is warm and dry.     Capillary Refill: Capillary refill takes less than 2 seconds.     Coloration: Skin is not jaundiced or pale.     Findings: No bruising, erythema, lesion or rash.  Neurological:     General: No focal deficit present.     Mental Status: She is alert and oriented to person, place, and time. Mental status is at baseline.     Cranial Nerves: No cranial nerve deficit.     Sensory: No sensory deficit.     Motor: No weakness.     Coordination: Coordination normal.     Gait: Gait normal.     Deep Tendon Reflexes: Reflexes normal.  Psychiatric:        Mood and Affect: Mood normal.        Behavior: Behavior normal.        Thought Content: Thought content normal.        Judgment: Judgment normal.     Results for orders placed or performed in visit on 05/20/20  CBC with Differential/Platelet  Result Value Ref Range   WBC 5.3 3.4 - 10.8 x10E3/uL   RBC 3.56 (L) 3.77 - 5.28 x10E6/uL   Hemoglobin 11.5 11.1 - 15.9 g/dL   Hematocrit 33.5 (L) 34.0 - 46.6 %   MCV 94 79 - 97 fL   MCH 32.3 26.6 - 33.0 pg   MCHC 34.3 31.5 - 35.7 g/dL   RDW 12.6 11.7 - 15.4 %   Platelets 286 150 -  450 x10E3/uL   Neutrophils 78 Not Estab. %   Lymphs 11 Not Estab. %   Monocytes 9 Not Estab. %   Eos 2 Not Estab. %   Basos 0 Not Estab. %   Neutrophils Absolute 4.1 1.4 - 7.0 x10E3/uL   Lymphocytes Absolute 0.6 (L) 0.7 - 3.1 x10E3/uL   Monocytes Absolute 0.5 0.1 - 0.9 x10E3/uL   EOS (ABSOLUTE) 0.1 0.0 - 0.4 x10E3/uL   Basophils Absolute 0.0 0.0 - 0.2 x10E3/uL   Immature Granulocytes 0 Not Estab. %   Immature Grans (Abs) 0.0 0.0 - 0.1 x10E3/uL  Comprehensive metabolic panel  Result Value Ref Range   Glucose 83 65 - 99 mg/dL   BUN 14 8 - 27 mg/dL   Creatinine, Ser 1.05 (H) 0.57 - 1.00 mg/dL   eGFR 56 (L) >59 mL/min/1.73   BUN/Creatinine Ratio 13 12 - 28   Sodium 130 (L) 134 - 144 mmol/L   Potassium 4.9 3.5 - 5.2 mmol/L   Chloride 94 (L) 96 - 106 mmol/L   CO2 21 20 - 29 mmol/L   Calcium 9.2 8.7 - 10.3 mg/dL   Total Protein 7.0 6.0 - 8.5 g/dL   Albumin 4.3 3.7 - 4.7 g/dL   Globulin, Total 2.7 1.5 -   4.5 g/dL   Albumin/Globulin Ratio 1.6 1.2 - 2.2   Bilirubin Total 0.3 0.0 - 1.2 mg/dL   Alkaline Phosphatase 108 44 - 121 IU/L   AST 13 0 - 40 IU/L   ALT 7 0 - 32 IU/L  Lipid Panel w/o Chol/HDL Ratio  Result Value Ref Range   Cholesterol, Total 188 100 - 199 mg/dL   Triglycerides 86 0 - 149 mg/dL   HDL 67 >39 mg/dL   VLDL Cholesterol Cal 16 5 - 40 mg/dL   LDL Chol Calc (NIH) 105 (H) 0 - 99 mg/dL  TSH  Result Value Ref Range   TSH 2.480 0.450 - 4.500 uIU/mL      Assessment & Plan:   Problem List Items Addressed This Visit      Cardiovascular and Mediastinum   Essential hypertension    Not under good control right now, possibly due to pain. Will get pain under better control, then recheck. Call with any concerns.        Relevant Orders   CBC with Differential/Platelet (Completed)   Comprehensive metabolic panel (Completed)   Microalbumin, Urine Waived   Peripheral vascular disease (HCC)    Will keep her BP and cholesterol under good control. Continue to monitor. Call  with any concerns.       Relevant Orders   CBC with Differential/Platelet (Completed)   Comprehensive metabolic panel (Completed)   Carotid atherosclerosis    Will keep her BP and cholesterol under good control. Continue to monitor. Call with any concerns.       Relevant Orders   CBC with Differential/Platelet (Completed)   Comprehensive metabolic panel (Completed)     Respiratory   COPD (chronic obstructive pulmonary disease) (HCC)    Under good control on current regimen. Continue current regimen. Continue to monitor. Call with any concerns. Refills given.        Relevant Medications   Fluticasone-Umeclidin-Vilant (TRELEGY ELLIPTA) 100-62.5-25 MCG/INH AEPB   Other Relevant Orders   CBC with Differential/Platelet (Completed)   Comprehensive metabolic panel (Completed)     Endocrine   Hypothyroidism    Rechecking labs today. Await results. Treat as needed.       Relevant Orders   CBC with Differential/Platelet (Completed)   Comprehensive metabolic panel (Completed)   TSH (Completed)     Genitourinary   Cervical cancer (HCC)    Continue to follow with oncology. Call with any concerns. Continue to monitor.         Other   Hyperlipidemia    Under good control on current regimen. Continue current regimen. Continue to monitor. Call with any concerns. Refills given. Labs drawn today.        Relevant Orders   CBC with Differential/Platelet (Completed)   Comprehensive metabolic panel (Completed)   Lipid Panel w/o Chol/HDL Ratio (Completed)   Tobacco use    Not interested in quitting right now. Continue to monitor.       Relevant Orders   Urinalysis, Routine w reflex microscopic    Other Visit Diagnoses    Routine general medical examination at a health care facility    -  Primary   Vaccines up to date. Screening labs checked today. Pap and colonoscopy up to date. Mammogram and DEXA ordered. Continue diet and exercise. Call with any concern   Acute bilateral low  back pain with left-sided sciatica       Will treat with toradol shot today. Obtain x-rays and treat with naproxen and flexeril. Await results.   Call with any concerns. Continue to monitor.   Relevant Medications   ketorolac (TORADOL) injection 60 mg (Completed)   naproxen (NAPROSYN) 500 MG tablet   cyclobenzaprine (FLEXERIL) 10 MG tablet   Other Relevant Orders   DG Lumbar Spine Complete   Screening for osteoporosis       DEXA ordered today.   Relevant Orders   DG Bone Density   Encounter for screening mammogram for malignant neoplasm of breast       Mammogram ordered today.   Relevant Orders   MM 3D SCREEN BREAST BILATERAL       Follow up plan: Return in about 2 weeks (around 06/03/2020).   LABORATORY TESTING:  - Pap smear: not applicable  IMMUNIZATIONS:   - Tdap: Tetanus vaccination status reviewed: last tetanus booster within 10 years. - Influenza: Up to date - Pneumovax: Up to date - Prevnar: Up to date - COVID: Up to date  SCREENING: -Mammogram: Ordered today  - Colonoscopy: Up to date  - Bone Density: Ordered today    PATIENT COUNSELING:   Advised to take 1 mg of folate supplement per day if capable of pregnancy.   Sexuality: Discussed sexually transmitted diseases, partner selection, use of condoms, avoidance of unintended pregnancy  and contraceptive alternatives.   Advised to avoid cigarette smoking.  I discussed with the patient that most people either abstain from alcohol or drink within safe limits (<=14/week and <=4 drinks/occasion for males, <=7/weeks and <= 3 drinks/occasion for females) and that the risk for alcohol disorders and other health effects rises proportionally with the number of drinks per week and how often a drinker exceeds daily limits.  Discussed cessation/primary prevention of drug use and availability of treatment for abuse.   Diet: Encouraged to adjust caloric intake to maintain  or achieve ideal body weight, to reduce intake of  dietary saturated fat and total fat, to limit sodium intake by avoiding high sodium foods and not adding table salt, and to maintain adequate dietary potassium and calcium preferably from fresh fruits, vegetables, and low-fat dairy products.    stressed the importance of regular exercise  Injury prevention: Discussed safety belts, safety helmets, smoke detector, smoking near bedding or upholstery.   Dental health: Discussed importance of regular tooth brushing, flossing, and dental visits.    NEXT PREVENTATIVE PHYSICAL DUE IN 1 YEAR. Return in about 2 weeks (around 06/03/2020).           

## 2020-05-21 ENCOUNTER — Other Ambulatory Visit: Payer: Medicare HMO

## 2020-05-21 ENCOUNTER — Encounter: Payer: Self-pay | Admitting: Family Medicine

## 2020-05-21 LAB — COMPREHENSIVE METABOLIC PANEL
ALT: 7 IU/L (ref 0–32)
AST: 13 IU/L (ref 0–40)
Albumin/Globulin Ratio: 1.6 (ref 1.2–2.2)
Albumin: 4.3 g/dL (ref 3.7–4.7)
Alkaline Phosphatase: 108 IU/L (ref 44–121)
BUN/Creatinine Ratio: 13 (ref 12–28)
BUN: 14 mg/dL (ref 8–27)
Bilirubin Total: 0.3 mg/dL (ref 0.0–1.2)
CO2: 21 mmol/L (ref 20–29)
Calcium: 9.2 mg/dL (ref 8.7–10.3)
Chloride: 94 mmol/L — ABNORMAL LOW (ref 96–106)
Creatinine, Ser: 1.05 mg/dL — ABNORMAL HIGH (ref 0.57–1.00)
Globulin, Total: 2.7 g/dL (ref 1.5–4.5)
Glucose: 83 mg/dL (ref 65–99)
Potassium: 4.9 mmol/L (ref 3.5–5.2)
Sodium: 130 mmol/L — ABNORMAL LOW (ref 134–144)
Total Protein: 7 g/dL (ref 6.0–8.5)
eGFR: 56 mL/min/{1.73_m2} — ABNORMAL LOW (ref >59)

## 2020-05-21 LAB — CBC WITH DIFFERENTIAL/PLATELET
Basophils Absolute: 0 10*3/uL (ref 0.0–0.2)
Basos: 0 %
EOS (ABSOLUTE): 0.1 10*3/uL (ref 0.0–0.4)
Eos: 2 %
Hematocrit: 33.5 % — ABNORMAL LOW (ref 34.0–46.6)
Hemoglobin: 11.5 g/dL (ref 11.1–15.9)
Immature Grans (Abs): 0 10*3/uL (ref 0.0–0.1)
Immature Granulocytes: 0 %
Lymphocytes Absolute: 0.6 10*3/uL — ABNORMAL LOW (ref 0.7–3.1)
Lymphs: 11 %
MCH: 32.3 pg (ref 26.6–33.0)
MCHC: 34.3 g/dL (ref 31.5–35.7)
MCV: 94 fL (ref 79–97)
Monocytes Absolute: 0.5 10*3/uL (ref 0.1–0.9)
Monocytes: 9 %
Neutrophils Absolute: 4.1 10*3/uL (ref 1.4–7.0)
Neutrophils: 78 %
Platelets: 286 10*3/uL (ref 150–450)
RBC: 3.56 x10E6/uL — ABNORMAL LOW (ref 3.77–5.28)
RDW: 12.6 % (ref 11.7–15.4)
WBC: 5.3 10*3/uL (ref 3.4–10.8)

## 2020-05-21 LAB — URINALYSIS, ROUTINE W REFLEX MICROSCOPIC
Bilirubin, UA: NEGATIVE
Glucose, UA: NEGATIVE
Ketones, UA: NEGATIVE
Nitrite, UA: NEGATIVE
Protein,UA: NEGATIVE
RBC, UA: NEGATIVE
Specific Gravity, UA: 1.025 (ref 1.005–1.030)
Urobilinogen, Ur: 0.2 mg/dL (ref 0.2–1.0)
pH, UA: 5.5 (ref 5.0–7.5)

## 2020-05-21 LAB — LIPID PANEL W/O CHOL/HDL RATIO
Cholesterol, Total: 188 mg/dL (ref 100–199)
HDL: 67 mg/dL (ref >39)
LDL Chol Calc (NIH): 105 mg/dL — ABNORMAL HIGH (ref 0–99)
Triglycerides: 86 mg/dL (ref 0–149)
VLDL Cholesterol Cal: 16 mg/dL (ref 5–40)

## 2020-05-21 LAB — MICROSCOPIC EXAMINATION
Bacteria, UA: NONE SEEN
RBC, Urine: NONE SEEN /hpf (ref 0–2)

## 2020-05-21 LAB — MICROALBUMIN, URINE WAIVED
Creatinine, Urine Waived: 200 mg/dL (ref 10–300)
Microalb, Ur Waived: 10 mg/L (ref 0–19)
Microalb/Creat Ratio: <30 mg/g (ref <30)

## 2020-05-21 LAB — TSH: TSH: 2.48 u[IU]/mL (ref 0.450–4.500)

## 2020-05-21 NOTE — Assessment & Plan Note (Signed)
Not interested in quitting right now. Continue to monitor.

## 2020-05-21 NOTE — Assessment & Plan Note (Signed)
Not under good control right now, possibly due to pain. Will get pain under better control, then recheck. Call with any concerns.

## 2020-05-21 NOTE — Assessment & Plan Note (Signed)
Under good control on current regimen. Continue current regimen. Continue to monitor. Call with any concerns. Refills given.   

## 2020-05-21 NOTE — Assessment & Plan Note (Signed)
Continue to follow with oncology. Call with any concerns. Continue to monitor.

## 2020-05-21 NOTE — Assessment & Plan Note (Signed)
Will keep her BP and cholesterol under good control. Continue to monitor. Call with any concerns.

## 2020-05-21 NOTE — Assessment & Plan Note (Signed)
Under good control on current regimen. Continue current regimen. Continue to monitor. Call with any concerns. Refills given. Labs drawn today.   

## 2020-05-21 NOTE — Assessment & Plan Note (Signed)
Rechecking labs today. Await results. Treat as needed.  °

## 2020-05-24 ENCOUNTER — Ambulatory Visit
Admission: RE | Admit: 2020-05-24 | Discharge: 2020-05-24 | Disposition: A | Discharge status: Home / Self Care | Payer: Medicare HMO | Source: Ambulatory Visit | Attending: Radiation Oncology | Admitting: Radiation Oncology

## 2020-05-24 ENCOUNTER — Encounter: Payer: Self-pay | Admitting: Radiation Oncology

## 2020-05-24 VITALS — BP 157/63 | HR 84 | Wt 128.0 lb

## 2020-05-24 DIAGNOSIS — R918 Other nonspecific abnormal finding of lung field: Secondary | ICD-10-CM | POA: Diagnosis not present

## 2020-05-24 DIAGNOSIS — Z923 Personal history of irradiation: Secondary | ICD-10-CM | POA: Diagnosis not present

## 2020-05-24 DIAGNOSIS — Z08 Encounter for follow-up examination after completed treatment for malignant neoplasm: Secondary | ICD-10-CM | POA: Diagnosis not present

## 2020-05-24 DIAGNOSIS — R911 Solitary pulmonary nodule: Secondary | ICD-10-CM

## 2020-05-24 NOTE — Progress Notes (Signed)
Radiation Oncology Follow up Note  Name: Mary Hoover   Date:   05/24/2020 MRN:  [id number] DOB: [birth year]    This 75 y.o. female presents to the clinic today for 96-month follow-up status post SBRT to a right upper lobe for presumed stage I non-small cell lung cancer.  REFERRING PROVIDER: [name], DO  HPI: Patient is a 75 year old female now out 4 months having completed SBRT to her right upper lobe for stage I non-small cell lung cancer seen today in routine follow-up she is doing well.  She specifically [place] cough hemoptysis chest tightness.  She had a recent CT scan of the chest showing interval reduction in the size of irregular solid superior right pulmonary nodule compatible with treatment response.  COMPLICATIONS OF TREATMENT: none  FOLLOW UP COMPLIANCE: keeps appointments   PHYSICAL EXAM:  BP (!) 157/63   Pulse 84   Wt 128 lb (58.1 kg)   SpO2 100% Comment: room air  BMI 19.46 kg/m  Well-developed well-nourished patient in NAD. HEENT reveals PERLA, EOMI, discs not visualized.  Oral cavity is clear. No oral mucosal lesions are identified. Neck is clear without evidence of cervical or supraclavicular adenopathy. Lungs are clear to A&P. Cardiac examination is essentially unremarkable with regular rate and rhythm without murmur rub or thrill. Abdomen is benign with no organomegaly or masses noted. Motor sensory and DTR levels are equal and symmetric in the upper and lower extremities. Cranial nerves II through XII are grossly intact. Proprioception is intact. No peripheral adenopathy or edema is identified. No motor or sensory levels are noted. Crude visual fields are within normal range.  RADIOLOGY RESULTS: CT scans reviewed compatible with above-stated findings  PLAN: Present time patient is doing well with excellent response to SBRT.  Of asked to see her back in 6 months for follow-up with a CT scan prior to that visit.  Patient knows to call with any concerns at any  time.  I would like to take this opportunity to thank you for allowing me to participate in the care of your patient.Noreene Filbert, MD

## 2020-06-02 ENCOUNTER — Telehealth: Payer: Self-pay

## 2020-06-02 NOTE — Telephone Encounter (Signed)
Copied from [place] [id number]. Topic: Referral - Request for Referral >> Jun 02, 2020  1:37 PM Celene Kras wrote: Has patient seen PCP for this complaint? Yes.   *If NO, is insurance requiring patient see PCP for this issue before PCP can refer them? Referral for which specialty: Bone Doctor  Preferred provider/office: N/A Reason for referral: Pt states that she was needing to be seen due to the pain in her bones. Please advise.

## 2020-06-03 NOTE — Telephone Encounter (Signed)
Called pt advised of Dr Durenda Age message pt is scheduled 5/12 already

## 2020-06-10 ENCOUNTER — Encounter: Payer: Self-pay | Admitting: Family Medicine

## 2020-06-10 ENCOUNTER — Ambulatory Visit (INDEPENDENT_AMBULATORY_CARE_PROVIDER_SITE_OTHER): Payer: Medicare HMO | Admitting: Family Medicine

## 2020-06-10 ENCOUNTER — Other Ambulatory Visit: Payer: Self-pay

## 2020-06-10 VITALS — BP 150/77 | HR 85 | Temp 98.2°F | Wt 128.0 lb

## 2020-06-10 DIAGNOSIS — M4726 Other spondylosis with radiculopathy, lumbar region: Secondary | ICD-10-CM | POA: Diagnosis not present

## 2020-06-10 MED ORDER — TRIAMCINOLONE ACETONIDE 40 MG/ML IJ SUSP
40.0000 mg | Freq: Once | INTRAMUSCULAR | Status: AC
Start: 2020-06-10 — End: 2020-06-10
  Administered 2020-06-10: 40 mg via INTRAMUSCULAR

## 2020-06-10 MED ORDER — PREDNISONE 10 MG PO TABS: ORAL_TABLET | ORAL | 0 refills | Status: DC

## 2020-06-10 MED ORDER — GABAPENTIN 100 MG PO CAPS
100.0000 mg | ORAL_CAPSULE | Freq: Three times a day (TID) | ORAL | 3 refills | Status: DC
Start: 2020-06-10 — End: 2020-07-08

## 2020-06-10 NOTE — Assessment & Plan Note (Signed)
Triamcinalone shot today, will treat with prednisone and gabapentin. Referral to ortho and physical therapy in place. May need MRI. Call with any concerns.

## 2020-06-10 NOTE — Progress Notes (Signed)
BP (!) 150/77   Pulse 85   Temp 98.2 F (36.8 C)   Wt 128 lb (58.1 kg)   SpO2 99%   BMI 19.46 kg/m    Subjective:    Patient ID: Mary Hoover, female    DOB: 1945/07/19, 75 y.o.   MRN: [id number]  HPI: Mary Hoover is a 75 y.o. female  Chief Complaint  Patient presents with  . Tailbone Pain    Patient states pain persists in her left side of her bottom    BACK PAIN Duration: over 2 months Mechanism of injury: unknown Location: bilateral and low back Onset: gradual Severity: severe Quality: aching and sharp Frequency: constant Radiation: L leg above the knee Aggravating factors: getting up, moving Alleviating factors: nothing Status: worse Treatments attempted: rest, ice, heat, APAP, ibuprofen and aleve  Relief with NSAIDs?: mild Nighttime pain:  yes Paresthesias / decreased sensation:  no Bowel / bladder incontinence:  no Fevers:  no Dysuria / urinary frequency:  no  Relevant past medical, surgical, family and social history reviewed and updated as indicated. Interim medical history since our last visit reviewed. Allergies and medications reviewed and updated.  Review of Systems  Constitutional: Negative.   Respiratory: Negative.   Cardiovascular: Negative.   Gastrointestinal: Negative.   Musculoskeletal: Positive for back pain and myalgias. Negative for arthralgias, gait problem, joint swelling, neck pain and neck stiffness.  Skin: Negative.   Neurological: Negative.   Psychiatric/Behavioral: Negative.     Per HPI unless specifically indicated above     Objective:    BP (!) 150/77   Pulse 85   Temp 98.2 F (36.8 C)   Wt 128 lb (58.1 kg)   SpO2 99%   BMI 19.46 kg/m   Wt Readings from Last 3 Encounters:  06/10/20 128 lb (58.1 kg)  05/24/20 128 lb (58.1 kg)  05/20/20 127 lb 6.4 oz (57.8 kg)    Physical Exam Vitals and nursing note reviewed.  Constitutional:      General: She is not in acute distress.    Appearance: Normal  appearance. She is not ill-appearing, toxic-appearing or diaphoretic.  HENT:     Head: Normocephalic and atraumatic.     Right Ear: External ear normal.     Left Ear: External ear normal.     Nose: Nose normal.     Mouth/Throat:     Mouth: Mucous membranes are moist.     Pharynx: Oropharynx is clear.  Eyes:     General: No scleral icterus.       Right eye: No discharge.        Left eye: No discharge.     Extraocular Movements: Extraocular movements intact.     Conjunctiva/sclera: Conjunctivae normal.     Pupils: Pupils are equal, round, and reactive to light.  Cardiovascular:     Rate and Rhythm: Normal rate and regular rhythm.     Pulses: Normal pulses.     Heart sounds: Normal heart sounds. No murmur heard. No friction rub. No gallop.   Pulmonary:     Effort: Pulmonary effort is normal. No respiratory distress.     Breath sounds: Normal breath sounds. No stridor. No wheezing, rhonchi or rales.  Chest:     Chest wall: No tenderness.  Musculoskeletal:     Cervical back: Normal range of motion and neck supple.  Skin:    General: Skin is warm and dry.     Capillary Refill: Capillary refill takes less than 2  seconds.     Coloration: Skin is not jaundiced or pale.     Findings: No bruising, erythema, lesion or rash.  Neurological:     General: No focal deficit present.     Mental Status: She is alert and oriented to person, place, and time. Mental status is at baseline.     Gait: Gait abnormal.  Psychiatric:        Mood and Affect: Mood normal.        Behavior: Behavior normal.        Thought Content: Thought content normal.        Judgment: Judgment normal.     Results for orders placed or performed in visit on 05/20/20  Microscopic Examination   Urine  Result Value Ref Range   WBC, UA 0-5 0 - 5 /hpf   RBC None seen 0 - 2 /hpf   Epithelial Cells (non renal) 0-10 0 - 10 /hpf   Mucus, UA Present (A) Not Estab.   Bacteria, UA None seen None seen/Few  CBC with  Differential/Platelet  Result Value Ref Range   WBC 5.3 3.4 - 10.8 x10E3/uL   RBC 3.56 (L) 3.77 - 5.28 x10E6/uL   Hemoglobin 11.5 11.1 - 15.9 g/dL   Hematocrit 33.5 (L) 34.0 - 46.6 %   MCV 94 79 - 97 fL   MCH 32.3 26.6 - 33.0 pg   MCHC 34.3 31.5 - 35.7 g/dL   RDW 12.6 11.7 - 15.4 %   Platelets 286 150 - 450 x10E3/uL   Neutrophils 78 Not Estab. %   Lymphs 11 Not Estab. %   Monocytes 9 Not Estab. %   Eos 2 Not Estab. %   Basos 0 Not Estab. %   Neutrophils Absolute 4.1 1.4 - 7.0 x10E3/uL   Lymphocytes Absolute 0.6 (L) 0.7 - 3.1 x10E3/uL   Monocytes Absolute 0.5 0.1 - 0.9 x10E3/uL   EOS (ABSOLUTE) 0.1 0.0 - 0.4 x10E3/uL   Basophils Absolute 0.0 0.0 - 0.2 x10E3/uL   Immature Granulocytes 0 Not Estab. %   Immature Grans (Abs) 0.0 0.0 - 0.1 x10E3/uL  Comprehensive metabolic panel  Result Value Ref Range   Glucose 83 65 - 99 mg/dL   BUN 14 8 - 27 mg/dL   Creatinine, Ser 1.05 (H) 0.57 - 1.00 mg/dL   eGFR 56 (L) >59 mL/min/1.73   BUN/Creatinine Ratio 13 12 - 28   Sodium 130 (L) 134 - 144 mmol/L   Potassium 4.9 3.5 - 5.2 mmol/L   Chloride 94 (L) 96 - 106 mmol/L   CO2 21 20 - 29 mmol/L   Calcium 9.2 8.7 - 10.3 mg/dL   Total Protein 7.0 6.0 - 8.5 g/dL   Albumin 4.3 3.7 - 4.7 g/dL   Globulin, Total 2.7 1.5 - 4.5 g/dL   Albumin/Globulin Ratio 1.6 1.2 - 2.2   Bilirubin Total 0.3 0.0 - 1.2 mg/dL   Alkaline Phosphatase 108 44 - 121 IU/L   AST 13 0 - 40 IU/L   ALT 7 0 - 32 IU/L  Lipid Panel w/o Chol/HDL Ratio  Result Value Ref Range   Cholesterol, Total 188 100 - 199 mg/dL   Triglycerides 86 0 - 149 mg/dL   HDL 67 >39 mg/dL   VLDL Cholesterol Cal 16 5 - 40 mg/dL   LDL Chol Calc (NIH) 105 (H) 0 - 99 mg/dL  Urinalysis, Routine w reflex microscopic  Result Value Ref Range   Specific Gravity, UA 1.025 1.005 - 1.030   pH,  UA 5.5 5.0 - 7.5   Color, UA Yellow Yellow   Appearance Ur Cloudy (A) Clear   Leukocytes,UA 1+ (A) Negative   Protein,UA Negative Negative/Trace   Glucose, UA  Negative Negative   Ketones, UA Negative Negative   RBC, UA Negative Negative   Bilirubin, UA Negative Negative   Urobilinogen, Ur 0.2 0.2 - 1.0 mg/dL   Nitrite, UA Negative Negative   Microscopic Examination See below:   TSH  Result Value Ref Range   TSH 2.480 0.450 - 4.500 uIU/mL  Microalbumin, Urine Waived  Result Value Ref Range   Microalb, Ur Waived 10 0 - 19 mg/L   Creatinine, Urine Waived 200 10 - 300 mg/dL   Microalb/Creat Ratio <30 <30 mg/g      Assessment & Plan:   Problem List Items Addressed This Visit      Nervous and Auditory   Osteoarthritis of spine with radiculopathy, lumbar region - Primary    Triamcinalone shot today, will treat with prednisone and gabapentin. Referral to ortho and physical therapy in place. May need MRI. Call with any concerns.       Relevant Medications   triamcinolone acetonide (KENALOG-40) injection 40 mg   predniSONE (DELTASONE) 10 MG tablet   gabapentin (NEURONTIN) 100 MG capsule   Other Relevant Orders   Ambulatory referral to Orthopedic Surgery   Ambulatory referral to Physical Therapy       Follow up plan: Return in about 4 weeks (around 07/08/2020).

## 2020-06-16 DIAGNOSIS — M545 Low back pain, unspecified: Secondary | ICD-10-CM | POA: Diagnosis not present

## 2020-06-18 ENCOUNTER — Encounter: Payer: Self-pay | Admitting: Family Medicine

## 2020-06-18 ENCOUNTER — Ambulatory Visit (INDEPENDENT_AMBULATORY_CARE_PROVIDER_SITE_OTHER): Payer: Medicare HMO | Admitting: Family Medicine

## 2020-06-18 DIAGNOSIS — F4321 Adjustment disorder with depressed mood: Secondary | ICD-10-CM

## 2020-06-18 DIAGNOSIS — R5383 Other fatigue: Secondary | ICD-10-CM

## 2020-06-18 DIAGNOSIS — M4726 Other spondylosis with radiculopathy, lumbar region: Secondary | ICD-10-CM | POA: Diagnosis not present

## 2020-06-18 DIAGNOSIS — R1013 Epigastric pain: Secondary | ICD-10-CM

## 2020-06-18 MED ORDER — OMEPRAZOLE 20 MG PO CPDR: 20.0000 mg | DELAYED_RELEASE_CAPSULE | Freq: Every day | ORAL | 0 refills | Status: DC

## 2020-06-18 NOTE — Progress Notes (Signed)
There were no vitals taken for this visit.   Subjective:    Patient ID: Mary Hoover, female    DOB: [birth year], 75 y.o.   MRN: [id number]  HPI: Mary Hoover is a 75 y.o. female  Chief Complaint  Patient presents with  . Fatigue    Pt states she thinks her gabapentin has been making her feel more fatigued and tired here lately.    Mary Hoover has not been feeling well. She has been having trouble sleeping and has been having abdominal pain and been more shakey and anxious. She has not been taking her prednisone in the AM but spacing them out throughout the day. She has 5 pills left. She thought the belly pain was coming from her gabapentin- so she didn't take it last night, but has otherwise been taking it. She saw the orthopedist a couple of days ago and he is going to see her again next week. Her back pain is better. She is otherwise doing OK. She just lost a grandchild and another one is in the hospital, so she has been under a lot of stress.   Relevant past medical, surgical, family and social history reviewed and updated as indicated. Interim medical history since our last visit reviewed. Allergies and medications reviewed and updated.  Review of Systems  Constitutional: Negative.   HENT: Negative.   Respiratory: Negative.   Cardiovascular: Negative.   Gastrointestinal: Positive for abdominal pain. Negative for abdominal distention.    Per HPI unless specifically indicated above     Objective:    There were no vitals taken for this visit.  Wt Readings from Last 3 Encounters:  06/10/20 128 lb (58.1 kg)  05/24/20 128 lb (58.1 kg)  05/20/20 127 lb 6.4 oz (57.8 kg)    Physical Exam Vitals and nursing note reviewed.  Pulmonary:     Effort: Pulmonary effort is normal. No respiratory distress.     Comments: Speaking in full sentences Neurological:     Mental Status: She is alert.  Psychiatric:        Mood and Affect: Mood normal.        Behavior: Behavior normal.         Thought Content: Thought content normal.        Judgment: Judgment normal.     Results for orders placed or performed in visit on 05/20/20  Microscopic Examination   Urine  Result Value Ref Range   WBC, UA 0-5 0 - 5 /hpf   RBC None seen 0 - 2 /hpf   Epithelial Cells (non renal) 0-10 0 - 10 /hpf   Mucus, UA Present (A) Not Estab.   Bacteria, UA None seen None seen/Few  CBC with Differential/Platelet  Result Value Ref Range   WBC 5.3 3.4 - 10.8 x10E3/uL   RBC 3.56 (L) 3.77 - 5.28 x10E6/uL   Hemoglobin 11.5 11.1 - 15.9 g/dL   Hematocrit 33.5 (L) 34.0 - 46.6 %   MCV 94 79 - 97 fL   MCH 32.3 26.6 - 33.0 pg   MCHC 34.3 31.5 - 35.7 g/dL   RDW 12.6 11.7 - 15.4 %   Platelets 286 150 - 450 x10E3/uL   Neutrophils 78 Not Estab. %   Lymphs 11 Not Estab. %   Monocytes 9 Not Estab. %   Eos 2 Not Estab. %   Basos 0 Not Estab. %   Neutrophils Absolute 4.1 1.4 - 7.0 x10E3/uL   Lymphocytes Absolute 0.6 (L) 0.7 -  3.1 x10E3/uL   Monocytes Absolute 0.5 0.1 - 0.9 x10E3/uL   EOS (ABSOLUTE) 0.1 0.0 - 0.4 x10E3/uL   Basophils Absolute 0.0 0.0 - 0.2 x10E3/uL   Immature Granulocytes 0 Not Estab. %   Immature Grans (Abs) 0.0 0.0 - 0.1 x10E3/uL  Comprehensive metabolic panel  Result Value Ref Range   Glucose 83 65 - 99 mg/dL   BUN 14 8 - 27 mg/dL   Creatinine, Ser 1.05 (H) 0.57 - 1.00 mg/dL   eGFR 56 (L) >59 mL/min/1.73   BUN/Creatinine Ratio 13 12 - 28   Sodium 130 (L) 134 - 144 mmol/L   Potassium 4.9 3.5 - 5.2 mmol/L   Chloride 94 (L) 96 - 106 mmol/L   CO2 21 20 - 29 mmol/L   Calcium 9.2 8.7 - 10.3 mg/dL   Total Protein 7.0 6.0 - 8.5 g/dL   Albumin 4.3 3.7 - 4.7 g/dL   Globulin, Total 2.7 1.5 - 4.5 g/dL   Albumin/Globulin Ratio 1.6 1.2 - 2.2   Bilirubin Total 0.3 0.0 - 1.2 mg/dL   Alkaline Phosphatase 108 44 - 121 IU/L   AST 13 0 - 40 IU/L   ALT 7 0 - 32 IU/L  Lipid Panel w/o Chol/HDL Ratio  Result Value Ref Range   Cholesterol, Total 188 100 - 199 mg/dL   Triglycerides 86 0 -  149 mg/dL   HDL 67 >39 mg/dL   VLDL Cholesterol Cal 16 5 - 40 mg/dL   LDL Chol Calc (NIH) 105 (H) 0 - 99 mg/dL  Urinalysis, Routine w reflex microscopic  Result Value Ref Range   Specific Gravity, UA 1.025 1.005 - 1.030   pH, UA 5.5 5.0 - 7.5   Color, UA Yellow Yellow   Appearance Ur Cloudy (A) Clear   Leukocytes,UA 1+ (A) Negative   Protein,UA Negative Negative/Trace   Glucose, UA Negative Negative   Ketones, UA Negative Negative   RBC, UA Negative Negative   Bilirubin, UA Negative Negative   Urobilinogen, Ur 0.2 0.2 - 1.0 mg/dL   Nitrite, UA Negative Negative   Microscopic Examination See below:   TSH  Result Value Ref Range   TSH 2.480 0.450 - 4.500 uIU/mL  Microalbumin, Urine Waived  Result Value Ref Range   Microalb, Ur Waived 10 0 - 19 mg/L   Creatinine, Urine Waived 200 10 - 300 mg/dL   Microalb/Creat Ratio <30 <30 mg/g      Assessment & Plan:   Problem List Items Addressed This Visit      Nervous and Auditory   Osteoarthritis of spine with radiculopathy, lumbar region    Finish prednisone. Continue gabapentin. Continue to follow with ortho. Call with any concerns. Recheck as scheduled.        Other Visit Diagnoses    Epigastric pain    -  Primary   Due to her prednisone. Will start omeprazole- finishing prednisone Monday. Call with any concerns.    Fatigue, unspecified type       Likely side effect from taking prednisone at night. Will take it just in the AM. Recheck as scheduled in 2 weeks.    Grief       Likely exacerbated by prednisone symptoms. Continue to monitor. Call if getting worse.        Follow up plan: Return as scheduled.   . This visit was completed via telephone due to the restrictions of the COVID-19 pandemic. All issues as above were discussed and addressed but no physical exam was  performed. If it was felt that the patient should be evaluated in the office, they were directed there. The patient verbally consented to this visit. Patient  was unable to complete an audio/visual visit due to Lack of equipment. Due to the catastrophic nature of the COVID-19 pandemic, this visit was done through audio contact only. . Location of the patient: home . Location of the provider: work . Those involved with this call:  . Provider: Park Liter, DO . CMA: Yvonna Alanis, [place] . Front Desk/Registration: Levert Feinstein  . Time spent on call: 25 minutes on the phone discussing health concerns. 40 minutes total spent in review of patient's record and preparation of their chart.

## 2020-06-18 NOTE — Assessment & Plan Note (Signed)
Finish prednisone. Continue gabapentin. Continue to follow with ortho. Call with any concerns. Recheck as scheduled.

## 2020-06-22 ENCOUNTER — Other Ambulatory Visit: Payer: Self-pay | Admitting: Family Medicine

## 2020-06-22 NOTE — Telephone Encounter (Signed)
Medication Refill - Medication: predniSONE (DELTASONE) 10 MG tablet   Has the patient contacted their pharmacy? Yes.   (Agent: If no, request that the patient contact the pharmacy for the refill.) (Agent: If yes, when and what did the pharmacy advise?)  Preferred Pharmacy (with phone number or street name):  [facility], [place]  [place] [address]  Phone: [phone] Fax: [phone]     Agent: Please be advised that RX refills may take up to 3 business days. We ask that you follow-up with your pharmacy.

## 2020-06-23 ENCOUNTER — Ambulatory Visit: Payer: Self-pay | Admitting: *Deleted

## 2020-06-23 NOTE — Telephone Encounter (Signed)
Call to patient- left message to call office regarding Rx request. If she is having increased symptoms were she feels she needs this medication she may need appointment.

## 2020-06-23 NOTE — Telephone Encounter (Signed)
Pt reports 2 episode of "Being off balance." States brief, "Not really dizzy, just felt like I was leaning back." One episode yesterday, one this AM." States very brief, "Like I'm going backwards and had to hold onto something." Denies spinning, not positional, denies headache, no congestion or cough.Is staying hydrated.  Reports BP "Better than usual." Does not recall value. States similar symptoms when taking prednisone before. States finished course of prednisone yesterday. Also reports "Shaky" States anxious, depressed. Grandson died, one in hospital. States medication discussed at last OV, 06/18/20, would like med prescribed. Pt states "I think the prednisone might be causin some of this." Assured pt NT would route to practice forr PCPs review and final disposition. CAre advise given, advised ED for worsening symptoms. Pt verbalizes understanding.  CB# [phone]  Reason for Disposition . Taking a medicine that could cause dizziness (e.g., blood pressure medications, diuretics)  Answer Assessment - Initial Assessment Questions 1. DESCRIPTION: "Describe your dizziness."     "Off balance 2 episodes." 2. LIGHTHEADED: "Do you feel lightheaded?" (e.g., somewhat faint, woozy, weak upon standing)     "Not really" 3. VERTIGO: "Do you feel like either you or the room is spinning or tilting?" (i.e. vertigo)     No 4. SEVERITY: "How bad is it?"  "Do you feel like you are going to faint?" "Can you stand and walk?"   - MILD: Feels slightly dizzy, but walking normally.   - MODERATE: Feels unsteady when walking, but not falling; interferes with normal activities (e.g., school, work).   - SEVERE: Unable to walk without falling, or requires assistance to walk without falling; feels like passing out now.      2 episodes of having to hold onto something. Very brief. 5. ONSET:  "When did the dizziness begin?"     Yesterday 6. AGGRAVATING FACTORS: "Does anything make it worse?" (e.g., standing, change in head  position)     No 7. HEART RATE: "Can you tell me your heart rate?" "How many beats in 15 seconds?"  (Note: not all patients can do this)       8. CAUSE: "What do you think is causing the dizziness?"     Prednisone 9. RECURRENT SYMPTOM: "Have you had dizziness before?" If Yes, ask: "When was the last time?" "What happened that time?"     Yes, with prednisone.  10. OTHER SYMPTOMS: "Do you have any other symptoms?" (e.g., fever, chest pain, vomiting, diarrhea, bleeding)      Shaky, "Anxious, lots of things going on, grandson died, one in hospital." Little depressed, "But better."  Protocols used: DIZZINESS [facility]

## 2020-06-23 NOTE — Telephone Encounter (Signed)
Called pt advised Dr Wynetta Emery wants her to do virtual appt she is unavailable today and prefers to see only Dr Wynetta Emery. Pt scheduled 5/27 advised to contact us if need be sooner

## 2020-06-23 NOTE — Telephone Encounter (Signed)
OK to double book me at 11:20 tomorrow

## 2020-06-24 ENCOUNTER — Encounter: Payer: Self-pay | Admitting: Family Medicine

## 2020-06-24 ENCOUNTER — Ambulatory Visit: Payer: Medicare HMO | Admitting: Family Medicine

## 2020-06-24 ENCOUNTER — Other Ambulatory Visit: Payer: Self-pay

## 2020-06-24 VITALS — BP 141/61 | HR 87

## 2020-06-24 DIAGNOSIS — F4321 Adjustment disorder with depressed mood: Secondary | ICD-10-CM

## 2020-06-24 MED ORDER — LORAZEPAM 0.5 MG PO TABS: 0.2500 mg | ORAL_TABLET | Freq: Two times a day (BID) | ORAL | 0 refills | Status: DC | PRN

## 2020-06-24 NOTE — Progress Notes (Signed)
BP (!) 141/61   Pulse 87    Subjective:    Patient ID: Mary Hoover, female    DOB: 29-Aug-1945, 75 y.o.   MRN: [id number]  HPI: Mary Hoover is a 75 y.o. female  Chief Complaint  Patient presents with  . balance issues    Patient states she is feeling off balance for about 3 days, she feels like she is going backwards. Patient states she can not stand on one foot she becomes very off balance. Patient is not feeling dizzy.    [name] has been very sad and anxious. She recently lost a grandson who she raised as her own and another one has been in intensive care at the hospital. She has been shaking and crying and not feeling like herself. She notes that she got good news yesterday in that he woke up and seems to be doing a little better, but he is still very sick. She is feeling a little better, but is still really not feeling well. She would like something to help with her nerves. Otherwise doing OK with no other concerns or complaints at this time.   Relevant past medical, surgical, family and social history reviewed and updated as indicated. Interim medical history since our last visit reviewed. Allergies and medications reviewed and updated.  Review of Systems  Constitutional: Negative.   Respiratory: Negative.   Cardiovascular: Negative.   Gastrointestinal: Negative.   Musculoskeletal: Negative.   Neurological:       Shakes  Psychiatric/Behavioral: Negative.  Negative for agitation, behavioral problems, confusion, decreased concentration, dysphoric mood, hallucinations, self-injury, sleep disturbance and suicidal ideas. The patient is not nervous/anxious and is not hyperactive.        +grief    Per HPI unless specifically indicated above     Objective:    BP (!) 141/61   Pulse 87   Wt Readings from Last 3 Encounters:  06/10/20 128 lb (58.1 kg)  05/24/20 128 lb (58.1 kg)  05/20/20 127 lb 6.4 oz (57.8 kg)    Physical Exam Vitals and nursing note reviewed.   Pulmonary:     Effort: Pulmonary effort is normal. No respiratory distress.     Comments: Speaking in full sentences Neurological:     Mental Status: She is alert.  Psychiatric:        Mood and Affect: Mood normal. Affect is tearful.        Behavior: Behavior normal.        Thought Content: Thought content normal.        Judgment: Judgment normal.     Results for orders placed or performed in visit on 05/20/20  Microscopic Examination   Urine  Result Value Ref Range   WBC, UA 0-5 0 - 5 /hpf   RBC None seen 0 - 2 /hpf   Epithelial Cells (non renal) 0-10 0 - 10 /hpf   Mucus, UA Present (A) Not Estab.   Bacteria, UA None seen None seen/Few  CBC with Differential/Platelet  Result Value Ref Range   WBC 5.3 3.4 - 10.8 x10E3/uL   RBC 3.56 (L) 3.77 - 5.28 x10E6/uL   Hemoglobin 11.5 11.1 - 15.9 g/dL   Hematocrit 33.5 (L) 34.0 - 46.6 %   MCV 94 79 - 97 fL   MCH 32.3 26.6 - 33.0 pg   MCHC 34.3 31.5 - 35.7 g/dL   RDW 12.6 11.7 - 15.4 %   Platelets 286 150 - 450 x10E3/uL   Neutrophils 78 Not  Estab. %   Lymphs 11 Not Estab. %   Monocytes 9 Not Estab. %   Eos 2 Not Estab. %   Basos 0 Not Estab. %   Neutrophils Absolute 4.1 1.4 - 7.0 x10E3/uL   Lymphocytes Absolute 0.6 (L) 0.7 - 3.1 x10E3/uL   Monocytes Absolute 0.5 0.1 - 0.9 x10E3/uL   EOS (ABSOLUTE) 0.1 0.0 - 0.4 x10E3/uL   Basophils Absolute 0.0 0.0 - 0.2 x10E3/uL   Immature Granulocytes 0 Not Estab. %   Immature Grans (Abs) 0.0 0.0 - 0.1 x10E3/uL  Comprehensive metabolic panel  Result Value Ref Range   Glucose 83 65 - 99 mg/dL   BUN 14 8 - 27 mg/dL   Creatinine, Ser 1.05 (H) 0.57 - 1.00 mg/dL   eGFR 56 (L) >59 mL/min/1.73   BUN/Creatinine Ratio 13 12 - 28   Sodium 130 (L) 134 - 144 mmol/L   Potassium 4.9 3.5 - 5.2 mmol/L   Chloride 94 (L) 96 - 106 mmol/L   CO2 21 20 - 29 mmol/L   Calcium 9.2 8.7 - 10.3 mg/dL   Total Protein 7.0 6.0 - 8.5 g/dL   Albumin 4.3 3.7 - 4.7 g/dL   Globulin, Total 2.7 1.5 - 4.5 g/dL    Albumin/Globulin Ratio 1.6 1.2 - 2.2   Bilirubin Total 0.3 0.0 - 1.2 mg/dL   Alkaline Phosphatase 108 44 - 121 IU/L   AST 13 0 - 40 IU/L   ALT 7 0 - 32 IU/L  Lipid Panel w/o Chol/HDL Ratio  Result Value Ref Range   Cholesterol, Total 188 100 - 199 mg/dL   Triglycerides 86 0 - 149 mg/dL   HDL 67 >39 mg/dL   VLDL Cholesterol Cal 16 5 - 40 mg/dL   LDL Chol Calc (NIH) 105 (H) 0 - 99 mg/dL  Urinalysis, Routine w reflex microscopic  Result Value Ref Range   Specific Gravity, UA 1.025 1.005 - 1.030   pH, UA 5.5 5.0 - 7.5   Color, UA Yellow Yellow   Appearance Ur Cloudy (A) Clear   Leukocytes,UA 1+ (A) Negative   Protein,UA Negative Negative/Trace   Glucose, UA Negative Negative   Ketones, UA Negative Negative   RBC, UA Negative Negative   Bilirubin, UA Negative Negative   Urobilinogen, Ur 0.2 0.2 - 1.0 mg/dL   Nitrite, UA Negative Negative   Microscopic Examination See below:   TSH  Result Value Ref Range   TSH 2.480 0.450 - 4.500 uIU/mL  Microalbumin, Urine Waived  Result Value Ref Range   Microalb, Ur Waived 10 0 - 19 mg/L   Creatinine, Urine Waived 200 10 - 300 mg/dL   Microalb/Creat Ratio <30 <30 mg/g      Assessment & Plan:   Problem List Items Addressed This Visit   None   Visit Diagnoses    Grief    -  Primary   Will send short course of lorazepam to help with acute grief. Call with any concerns or if getting worse. Has a follow up in 2 weeks. Continue to monitor.       Follow up plan: Return if symptoms worsen or fail to improve.    . This visit was completed via telephone due to the restrictions of the COVID-19 pandemic. All issues as above were discussed and addressed but no physical exam was performed. If it was felt that the patient should be evaluated in the office, they were directed there. The patient verbally consented to this visit. Patient was unable  to complete an audio/visual visit due to Lack of equipment. Due to the catastrophic nature of the  COVID-19 pandemic, this visit was done through audio contact only. . Location of the patient: home . Location of the provider: work . Those involved with this call:  . Provider: Park Liter, DO . CMA: Louanna Raw, [place] . Front Desk/Registration: Levert Feinstein  . Time spent on call: 15 minutes on the phone discussing health concerns. 23 minutes total spent in review of patient's record and preparation of their chart.

## 2020-06-25 ENCOUNTER — Ambulatory Visit: Payer: Medicare HMO | Admitting: Family Medicine

## 2020-06-25 NOTE — Telephone Encounter (Signed)
Pt seen yesterday.

## 2020-07-08 ENCOUNTER — Encounter: Payer: Self-pay | Admitting: Family Medicine

## 2020-07-08 ENCOUNTER — Ambulatory Visit: Payer: Medicare HMO | Admitting: Family Medicine

## 2020-07-08 ENCOUNTER — Other Ambulatory Visit: Payer: Self-pay

## 2020-07-08 VITALS — BP 150/68 | HR 85 | Temp 98.2°F | Ht 68.0 in | Wt 125.6 lb

## 2020-07-08 DIAGNOSIS — F4321 Adjustment disorder with depressed mood: Secondary | ICD-10-CM

## 2020-07-08 DIAGNOSIS — M4726 Other spondylosis with radiculopathy, lumbar region: Secondary | ICD-10-CM | POA: Diagnosis not present

## 2020-07-08 MED ORDER — NAPROXEN 500 MG PO TABS: 500.0000 mg | ORAL_TABLET | Freq: Two times a day (BID) | ORAL | 1 refills | Status: DC

## 2020-07-08 MED ORDER — GABAPENTIN 100 MG PO CAPS: 100.0000 mg | ORAL_CAPSULE | Freq: Three times a day (TID) | ORAL | 1 refills | Status: DC

## 2020-07-08 NOTE — Assessment & Plan Note (Signed)
Improved with the gabapentin. Continue current regimen. Continue to monitor. Recheck in October.

## 2020-07-08 NOTE — Progress Notes (Signed)
BP (!) 150/68   Pulse 85   Temp 98.2 F (36.8 C)   Ht 5' 8"  (1.727 m)   Wt 125 lb 9.6 oz (57 kg)   SpO2 100%   BMI 19.10 kg/m    Subjective:    Patient ID: Mary Hoover, female    DOB: 1945/09/11, 75 y.o.   MRN: [id number]  HPI: Mary Hoover is a 75 y.o. female  Chief Complaint  Patient presents with   Osteoarthritis    Follow up back, patient states pain seems to be getting better   grief   Doing much better with her back. Has not gone to PT due to cost and has only seen ortho 1x without intervention. Pain has improved and she is feeling more like herself.   She is also doing better with her grief. Her grandson is recovering and she is feeling OK. The lorazepam has helped when she feels really overwhelmed. She is otherwise doing OK with no other concerns or complaints at this time.   Relevant past medical, surgical, family and social history reviewed and updated as indicated. Interim medical history since our last visit reviewed. Allergies and medications reviewed and updated.  Review of Systems  Constitutional: Negative.   Respiratory: Negative.    Cardiovascular: Negative.   Gastrointestinal: Negative.   Musculoskeletal: Negative.   Neurological: Negative.   Psychiatric/Behavioral: Negative.     Per HPI unless specifically indicated above     Objective:    BP (!) 150/68   Pulse 85   Temp 98.2 F (36.8 C)   Ht 5' 8"  (1.727 m)   Wt 125 lb 9.6 oz (57 kg)   SpO2 100%   BMI 19.10 kg/m   Wt Readings from Last 3 Encounters:  07/08/20 125 lb 9.6 oz (57 kg)  06/10/20 128 lb (58.1 kg)  05/24/20 128 lb (58.1 kg)    Physical Exam Vitals and nursing note reviewed.  Constitutional:      General: She is not in acute distress.    Appearance: Normal appearance. She is not ill-appearing, toxic-appearing or diaphoretic.  HENT:     Head: Normocephalic and atraumatic.     Right Ear: External ear normal.     Left Ear: External ear normal.     Nose: Nose  normal.     Mouth/Throat:     Mouth: Mucous membranes are moist.     Pharynx: Oropharynx is clear.  Eyes:     General: No scleral icterus.       Right eye: No discharge.        Left eye: No discharge.     Extraocular Movements: Extraocular movements intact.     Conjunctiva/sclera: Conjunctivae normal.     Pupils: Pupils are equal, round, and reactive to light.  Cardiovascular:     Rate and Rhythm: Normal rate and regular rhythm.     Pulses: Normal pulses.     Heart sounds: Normal heart sounds. No murmur heard.   No friction rub. No gallop.  Pulmonary:     Effort: Pulmonary effort is normal. No respiratory distress.     Breath sounds: Normal breath sounds. No stridor. No wheezing, rhonchi or rales.  Chest:     Chest wall: No tenderness.  Musculoskeletal:        General: Normal range of motion.     Cervical back: Normal range of motion and neck supple.  Skin:    General: Skin is warm and dry.  Capillary Refill: Capillary refill takes less than 2 seconds.     Coloration: Skin is not jaundiced or pale.     Findings: No bruising, erythema, lesion or rash.  Neurological:     General: No focal deficit present.     Mental Status: She is alert and oriented to person, place, and time. Mental status is at baseline.  Psychiatric:        Mood and Affect: Mood normal.        Behavior: Behavior normal.        Thought Content: Thought content normal.        Judgment: Judgment normal.    Results for orders placed or performed in visit on 05/20/20  Microscopic Examination   Urine  Result Value Ref Range   WBC, UA 0-5 0 - 5 /hpf   RBC None seen 0 - 2 /hpf   Epithelial Cells (non renal) 0-10 0 - 10 /hpf   Mucus, UA Present (A) Not Estab.   Bacteria, UA None seen None seen/Few  CBC with Differential/Platelet  Result Value Ref Range   WBC 5.3 3.4 - 10.8 x10E3/uL   RBC 3.56 (L) 3.77 - 5.28 x10E6/uL   Hemoglobin 11.5 11.1 - 15.9 g/dL   Hematocrit 33.5 (L) 34.0 - 46.6 %   MCV 94 79 -  97 fL   MCH 32.3 26.6 - 33.0 pg   MCHC 34.3 31.5 - 35.7 g/dL   RDW 12.6 11.7 - 15.4 %   Platelets 286 150 - 450 x10E3/uL   Neutrophils 78 Not Estab. %   Lymphs 11 Not Estab. %   Monocytes 9 Not Estab. %   Eos 2 Not Estab. %   Basos 0 Not Estab. %   Neutrophils Absolute 4.1 1.4 - 7.0 x10E3/uL   Lymphocytes Absolute 0.6 (L) 0.7 - 3.1 x10E3/uL   Monocytes Absolute 0.5 0.1 - 0.9 x10E3/uL   EOS (ABSOLUTE) 0.1 0.0 - 0.4 x10E3/uL   Basophils Absolute 0.0 0.0 - 0.2 x10E3/uL   Immature Granulocytes 0 Not Estab. %   Immature Grans (Abs) 0.0 0.0 - 0.1 x10E3/uL  Comprehensive metabolic panel  Result Value Ref Range   Glucose 83 65 - 99 mg/dL   BUN 14 8 - 27 mg/dL   Creatinine, Ser 1.05 (H) 0.57 - 1.00 mg/dL   eGFR 56 (L) >59 mL/min/1.73   BUN/Creatinine Ratio 13 12 - 28   Sodium 130 (L) 134 - 144 mmol/L   Potassium 4.9 3.5 - 5.2 mmol/L   Chloride 94 (L) 96 - 106 mmol/L   CO2 21 20 - 29 mmol/L   Calcium 9.2 8.7 - 10.3 mg/dL   Total Protein 7.0 6.0 - 8.5 g/dL   Albumin 4.3 3.7 - 4.7 g/dL   Globulin, Total 2.7 1.5 - 4.5 g/dL   Albumin/Globulin Ratio 1.6 1.2 - 2.2   Bilirubin Total 0.3 0.0 - 1.2 mg/dL   Alkaline Phosphatase 108 44 - 121 IU/L   AST 13 0 - 40 IU/L   ALT 7 0 - 32 IU/L  Lipid Panel w/o Chol/HDL Ratio  Result Value Ref Range   Cholesterol, Total 188 100 - 199 mg/dL   Triglycerides 86 0 - 149 mg/dL   HDL 67 >39 mg/dL   VLDL Cholesterol Cal 16 5 - 40 mg/dL   LDL Chol Calc (NIH) 105 (H) 0 - 99 mg/dL  Urinalysis, Routine w reflex microscopic  Result Value Ref Range   Specific Gravity, UA 1.025 1.005 - 1.030   pH,  UA 5.5 5.0 - 7.5   Color, UA Yellow Yellow   Appearance Ur Cloudy (A) Clear   Leukocytes,UA 1+ (A) Negative   Protein,UA Negative Negative/Trace   Glucose, UA Negative Negative   Ketones, UA Negative Negative   RBC, UA Negative Negative   Bilirubin, UA Negative Negative   Urobilinogen, Ur 0.2 0.2 - 1.0 mg/dL   Nitrite, UA Negative Negative   Microscopic  Examination See below:   TSH  Result Value Ref Range   TSH 2.480 0.450 - 4.500 uIU/mL  Microalbumin, Urine Waived  Result Value Ref Range   Microalb, Ur Waived 10 0 - 19 mg/L   Creatinine, Urine Waived 200 10 - 300 mg/dL   Microalb/Creat Ratio <30 <30 mg/g      Assessment & Plan:   Problem List Items Addressed This Visit       Nervous and Auditory   Osteoarthritis of spine with radiculopathy, lumbar region    Improved with the gabapentin. Continue current regimen. Continue to monitor. Recheck in October.        Relevant Medications   gabapentin (NEURONTIN) 100 MG capsule   naproxen (NAPROSYN) 500 MG tablet   Other Visit Diagnoses     Grief    -  Primary   Improved. Feeling better. Call with any concerns. Continue to monitor.         Follow up plan: No follow-ups on file.

## 2020-07-12 ENCOUNTER — Telehealth: Payer: Self-pay | Admitting: Obstetrics and Gynecology

## 2020-07-12 NOTE — Telephone Encounter (Signed)
Patient requested to cancel her 6/15 GynOnc appointment.  She does not want to reschedule at this time because of other financial responsibilities.

## 2020-07-14 ENCOUNTER — Inpatient Hospital Stay: Payer: Medicare HMO

## 2020-07-29 ENCOUNTER — Other Ambulatory Visit: Payer: Self-pay | Admitting: Family Medicine

## 2020-07-29 DIAGNOSIS — C539 Malignant neoplasm of cervix uteri, unspecified: Secondary | ICD-10-CM

## 2020-07-29 NOTE — Telephone Encounter (Signed)
  Notes to clinic:  patient states that her stomach is starting to hurt again  Review for refill   Requested Prescriptions  Pending Prescriptions Disp Refills   prochlorperazine (COMPAZINE) 10 MG tablet 30 tablet 1    Sig: Take 1 tablet (10 mg total) by mouth every 6 (six) hours as needed (Nausea or vomiting).      Not Delegated - Gastroenterology: Antiemetics Failed - 07/29/2020 10:49 AM      Failed - This refill cannot be delegated      Passed - Valid encounter within last 6 months    Recent Outpatient Visits           3 weeks ago [place], [place], DO   1 month ago [place], [place], DO   1 month ago Epigastric pain   [place], [place], DO   1 month ago Osteoarthritis of spine with radiculopathy, lumbar region   [place], DO   2 months ago Routine general medical examination at a health care facility   [place], Barb Merino, DO       Future Appointments             In 3 months Wynetta Emery, Barb Merino, DO [facility], [facility]

## 2020-07-29 NOTE — Telephone Encounter (Signed)
Medication Refill - Medication: prochlorperazine (COMPAZINE) 10 MG tablet   Has the patient contacted their pharmacy? No. Pt states her stomach is starting to hurt again  Preferred Pharmacy (with phone number or street name):  [address]), [place]  Phone:  [phone] Fax:  [phone]  Agent: Please be advised that RX refills may take up to 3 business days. We ask that you follow-up with your pharmacy.

## 2020-07-29 NOTE — Telephone Encounter (Signed)
Pt Is scheduled 10/11

## 2020-07-30 ENCOUNTER — Other Ambulatory Visit: Payer: Self-pay | Admitting: Family Medicine

## 2020-07-30 DIAGNOSIS — C539 Malignant neoplasm of cervix uteri, unspecified: Secondary | ICD-10-CM

## 2020-07-30 MED ORDER — PROCHLORPERAZINE MALEATE 10 MG PO TABS
10.0000 mg | ORAL_TABLET | Freq: Four times a day (QID) | ORAL | 1 refills | Status: DC | PRN
Start: 2020-07-30 — End: 2020-07-30

## 2020-07-30 NOTE — Telephone Encounter (Signed)
  Notes to clinic: medication was sent to the wrong pharmacy  Please resend to [facility]    Requested Prescriptions  Pending Prescriptions Disp Refills   prochlorperazine (COMPAZINE) 10 MG tablet 30 tablet 1    Sig: Take 1 tablet (10 mg total) by mouth every 6 (six) hours as needed (Nausea or vomiting).      Not Delegated - Gastroenterology: Antiemetics Failed - 07/30/2020  2:21 PM      Failed - This refill cannot be delegated      Passed - Valid encounter within last 6 months    Recent Outpatient Visits           3 weeks ago [place], [place], DO   1 month ago Grief   [place], [place], DO   1 month ago Epigastric pain   [place], [place], DO   1 month ago Osteoarthritis of spine with radiculopathy, lumbar region   [place], DO   2 months ago Routine general medical examination at a health care facility   [place], Barb Merino, DO       Future Appointments             In 3 months Wynetta Emery, Barb Merino, DO [facility], [facility]

## 2020-07-30 NOTE — Telephone Encounter (Signed)
Medication: prochlorperazine (COMPAZINE) 10 MG tablet [[id number]] - Medication was sent to the wrong pharmacy. Can it be sent to the below pharmacy.  Has the patient contacted their pharmacy? YES  (Agent: If no, request that the patient contact the pharmacy for the refill.) (Agent: If yes, when and what did the pharmacy advise?)  Preferred Pharmacy (with phone number or street name): [place] [facility] [place] - [address] [place] [address] Phone: [phone] Fax: [phone] Hours: Not open 24 hours  Pharmacy will not be open on Monday    Agent: Please be advised that RX refills may take up to 3 business days. We ask that you follow-up with your pharmacy.

## 2020-08-03 MED ORDER — PROCHLORPERAZINE MALEATE 10 MG PO TABS
10.0000 mg | ORAL_TABLET | Freq: Four times a day (QID) | ORAL | 1 refills | Status: DC | PRN
Start: 2020-08-03 — End: 2020-11-09

## 2020-08-03 NOTE — Telephone Encounter (Signed)
Pt is scheduled 10/11

## 2020-08-05 ENCOUNTER — Telehealth: Payer: Self-pay | Admitting: Family Medicine

## 2020-08-05 NOTE — Telephone Encounter (Signed)
Copied from [place] [id number]. Topic: Medicare AWV >> Aug 05, 2020 10:32 AM Cher Nakai R wrote: Reason for CRM:  Left message for patient to call back and schedule the Medicare Annual Wellness Visit (AWV) virtually or by telephone.  Last AWV 04/30/2019  Please schedule at anytime with [facility] Health Advisor.  45 minute appointment  Any questions, please call me at [phone]

## 2020-09-07 ENCOUNTER — Other Ambulatory Visit: Payer: Self-pay | Admitting: Family Medicine

## 2020-09-07 NOTE — Telephone Encounter (Signed)
Requested Prescriptions  Pending Prescriptions Disp Refills  . clopidogrel (PLAVIX) 75 MG tablet [Pharmacy Med Name: CLOPIDOGREL 75 MG Tablet] 90 tablet 0    Sig: TAKE 1 TABLET EVERY DAY     Hematology: Antiplatelets - clopidogrel Failed - 09/07/2020  7:15 PM      Failed - Evaluate AST, ALT within 2 months of therapy initiation.      Failed - HCT in normal range and within 180 days    Hematocrit  Date Value Ref Range Status  05/20/2020 33.5 (L) 34.0 - 46.6 % Final         Passed - ALT in normal range and within 360 days    ALT  Date Value Ref Range Status  05/20/2020 7 0 - 32 IU/L Final         Passed - AST in normal range and within 360 days    AST  Date Value Ref Range Status  05/20/2020 13 0 - 40 IU/L Final         Passed - HGB in normal range and within 180 days    Hemoglobin  Date Value Ref Range Status  05/20/2020 11.5 11.1 - 15.9 g/dL Final         Passed - PLT in normal range and within 180 days    Platelets  Date Value Ref Range Status  05/20/2020 286 150 - 450 x10E3/uL Final         Passed - Valid encounter within last 6 months    Recent Outpatient Visits          2 months ago [place], [place], DO   2 months ago [place], [place], DO   2 months ago Epigastric pain   [place], [place], DO   2 months ago Osteoarthritis of spine with radiculopathy, lumbar region   [place], [place], DO   3 months ago Routine general medical examination at a health care facility   [place], Barb Merino, DO      Future Appointments            In 2 months Wynetta Emery, Barb Merino, DO [facility], [facility]

## 2020-09-20 ENCOUNTER — Other Ambulatory Visit: Payer: Self-pay | Admitting: Family Medicine

## 2020-09-20 NOTE — Telephone Encounter (Signed)
Requested Prescriptions  Pending Prescriptions Disp Refills  . lisinopril (ZESTRIL) 20 MG tablet [Pharmacy Med Name: LISINOPRIL 20 MG Tablet] 90 tablet 0    Sig: TAKE 1 TABLET EVERY DAY     Cardiovascular:  ACE Inhibitors Failed - 09/20/2020  3:21 PM      Failed - Cr in normal range and within 180 days    Creatinine  Date Value Ref Range Status  05/14/2014 0.78 mg/dL Final    Comment:    0.44-1.00 NOTE: New Reference Range  04/07/14    Creatinine, Ser  Date Value Ref Range Status  05/20/2020 1.05 (H) 0.57 - 1.00 mg/dL Final         Failed - Last BP in normal range    BP Readings from Last 1 Encounters:  07/08/20 (!) 150/68         Passed - K in normal range and within 180 days    Potassium  Date Value Ref Range Status  05/20/2020 4.9 3.5 - 5.2 mmol/L Final  05/14/2014 4.3 mmol/L Final    Comment:    3.5-5.1 NOTE: New Reference Range  04/07/14          Passed - Patient is not pregnant      Passed - Valid encounter within last 6 months    Recent Outpatient Visits          2 months ago [place], [place], DO   2 months ago [place], [place], DO   3 months ago Epigastric pain   [place], [place], DO   3 months ago Osteoarthritis of spine with radiculopathy, lumbar region   [place], [place], DO   4 months ago Routine general medical examination at a health care facility   [place], [place], DO      Future Appointments            In 1 month Wynetta Emery, Barb Merino, DO [employer], [place]

## 2020-11-09 ENCOUNTER — Ambulatory Visit (INDEPENDENT_AMBULATORY_CARE_PROVIDER_SITE_OTHER): Payer: Medicare HMO | Admitting: Family Medicine

## 2020-11-09 ENCOUNTER — Other Ambulatory Visit: Payer: Self-pay

## 2020-11-09 ENCOUNTER — Encounter: Payer: Self-pay | Admitting: Family Medicine

## 2020-11-09 VITALS — BP 161/83 | HR 82 | Ht 68.0 in | Wt 125.0 lb

## 2020-11-09 DIAGNOSIS — E782 Mixed hyperlipidemia: Secondary | ICD-10-CM

## 2020-11-09 DIAGNOSIS — E034 Atrophy of thyroid (acquired): Secondary | ICD-10-CM

## 2020-11-09 DIAGNOSIS — I10 Essential (primary) hypertension: Secondary | ICD-10-CM | POA: Diagnosis not present

## 2020-11-09 MED ORDER — GABAPENTIN 100 MG PO CAPS: 100.0000 mg | ORAL_CAPSULE | Freq: Three times a day (TID) | ORAL | 1 refills | Status: DC

## 2020-11-09 MED ORDER — OMEPRAZOLE 20 MG PO CPDR: 20.0000 mg | DELAYED_RELEASE_CAPSULE | Freq: Every day | ORAL | 1 refills | Status: DC

## 2020-11-09 MED ORDER — CLOPIDOGREL BISULFATE 75 MG PO TABS: 75.0000 mg | ORAL_TABLET | Freq: Every day | ORAL | 3 refills | Status: DC

## 2020-11-09 MED ORDER — LISINOPRIL 20 MG PO TABS: 20.0000 mg | ORAL_TABLET | Freq: Every day | ORAL | 1 refills | Status: DC

## 2020-11-09 MED ORDER — CYCLOBENZAPRINE HCL 10 MG PO TABS: 10.0000 mg | ORAL_TABLET | Freq: Every day | ORAL | 1 refills | Status: DC

## 2020-11-09 NOTE — Progress Notes (Signed)
BP (!) 161/83   Pulse 82   Ht _0  (1.727 m)   Wt 125 lb (56.7 kg)   BMI 19.01 kg/m    Subjective:    Patient ID: Mary Hoover, female    DOB: 08-Jun-1945, 75 y.o.   MRN: [id number]  HPI: Mary Hoover is a 75 y.o. female  Chief Complaint  Patient presents with   Hypertension   Hypothyroidism   Hyperlipidemia   HYPERTENSION / [place] Satisfied with current treatment? yes Duration of hypertension: chronic BP monitoring frequency: not checking BP medication side effects: no Past BP meds: lisinopril Duration of hyperlipidemia: chronic Cholesterol medication side effects: no Cholesterol supplements: none Past cholesterol medications: none Medication compliance: excellent compliance Aspirin: no Recent stressors: no Recurrent headaches: no Visual changes: no Palpitations: no Dyspnea: no Chest pain: no Lower extremity edema: no Dizzy/lightheaded: no  HYPOTHYROIDISM Thyroid control status:stable Satisfied with current treatment? yes Medication side effects: no Medication compliance: excellent compliance Recent dose adjustment:no Fatigue: yes Cold intolerance: no Heat intolerance: no Weight gain: no Weight loss: no Constipation: no Diarrhea/loose stools: no Palpitations: no Lower extremity edema: no Anxiety/depressed mood: no  Relevant past medical, surgical, family and social history reviewed and updated as indicated. Interim medical history since our last visit reviewed. Allergies and medications reviewed and updated.  Review of Systems  Constitutional: Negative.   Respiratory: Negative.    Cardiovascular: Negative.   Gastrointestinal: Negative.   Musculoskeletal: Negative.   Psychiatric/Behavioral: Negative.     Per HPI unless specifically indicated above     Objective:    BP (!) 161/83   Pulse 82   Ht _1  (1.727 m)   Wt 125 lb (56.7 kg)   BMI 19.01 kg/m   Wt Readings from Last 3 Encounters:  11/09/20 125 lb (56.7 kg)   07/08/20 125 lb 9.6 oz (57 kg)  06/10/20 128 lb (58.1 kg)    Physical Exam Vitals and nursing note reviewed.  Constitutional:      General: She is not in acute distress.    Appearance: Normal appearance. She is not ill-appearing, toxic-appearing or diaphoretic.  HENT:     Head: Normocephalic and atraumatic.     Right Ear: External ear normal.     Left Ear: External ear normal.     Nose: Nose normal.     Mouth/Throat:     Mouth: Mucous membranes are moist.     Pharynx: Oropharynx is clear.  Eyes:     General: No scleral icterus.       Right eye: No discharge.        Left eye: No discharge.     Extraocular Movements: Extraocular movements intact.     Conjunctiva/sclera: Conjunctivae normal.     Pupils: Pupils are equal, round, and reactive to light.  Cardiovascular:     Rate and Rhythm: Normal rate and regular rhythm.     Pulses: Normal pulses.     Heart sounds: Normal heart sounds. No murmur heard.   No friction rub. No gallop.  Pulmonary:     Effort: Pulmonary effort is normal. No respiratory distress.     Breath sounds: Normal breath sounds. No stridor. No wheezing, rhonchi or rales.  Chest:     Chest wall: No tenderness.  Musculoskeletal:        General: Normal range of motion.     Cervical back: Normal range of motion and neck supple.  Skin:    General: Skin is warm and dry.  Capillary Refill: Capillary refill takes less than 2 seconds.     Coloration: Skin is not jaundiced or pale.     Findings: No bruising, erythema, lesion or rash.  Neurological:     General: No focal deficit present.     Mental Status: She is alert and oriented to person, place, and time. Mental status is at baseline.  Psychiatric:        Mood and Affect: Mood normal.        Behavior: Behavior normal.        Thought Content: Thought content normal.        Judgment: Judgment normal.    Results for orders placed or performed in visit on 05/20/20  Microscopic Examination   Urine  Result  Value Ref Range   WBC, UA 0-5 0 - 5 /hpf   RBC None seen 0 - 2 /hpf   Epithelial Cells (non renal) 0-10 0 - 10 /hpf   Mucus, UA Present (A) Not Estab.   Bacteria, UA None seen None seen/Few  CBC with Differential/Platelet  Result Value Ref Range   WBC 5.3 3.4 - 10.8 x10E3/uL   RBC 3.56 (L) 3.77 - 5.28 x10E6/uL   Hemoglobin 11.5 11.1 - 15.9 g/dL   Hematocrit 33.5 (L) 34.0 - 46.6 %   MCV 94 79 - 97 fL   MCH 32.3 26.6 - 33.0 pg   MCHC 34.3 31.5 - 35.7 g/dL   RDW 12.6 11.7 - 15.4 %   Platelets 286 150 - 450 x10E3/uL   Neutrophils 78 Not Estab. %   Lymphs 11 Not Estab. %   Monocytes 9 Not Estab. %   Eos 2 Not Estab. %   Basos 0 Not Estab. %   Neutrophils Absolute 4.1 1.4 - 7.0 x10E3/uL   Lymphocytes Absolute 0.6 (L) 0.7 - 3.1 x10E3/uL   Monocytes Absolute 0.5 0.1 - 0.9 x10E3/uL   EOS (ABSOLUTE) 0.1 0.0 - 0.4 x10E3/uL   Basophils Absolute 0.0 0.0 - 0.2 x10E3/uL   Immature Granulocytes 0 Not Estab. %   Immature Grans (Abs) 0.0 0.0 - 0.1 x10E3/uL  Comprehensive metabolic panel  Result Value Ref Range   Glucose 83 65 - 99 mg/dL   BUN 14 8 - 27 mg/dL   Creatinine, Ser 1.05 (H) 0.57 - 1.00 mg/dL   eGFR 56 (L) >59 mL/min/1.73   BUN/Creatinine Ratio 13 12 - 28   Sodium 130 (L) 134 - 144 mmol/L   Potassium 4.9 3.5 - 5.2 mmol/L   Chloride 94 (L) 96 - 106 mmol/L   CO2 21 20 - 29 mmol/L   Calcium 9.2 8.7 - 10.3 mg/dL   Total Protein 7.0 6.0 - 8.5 g/dL   Albumin 4.3 3.7 - 4.7 g/dL   Globulin, Total 2.7 1.5 - 4.5 g/dL   Albumin/Globulin Ratio 1.6 1.2 - 2.2   Bilirubin Total 0.3 0.0 - 1.2 mg/dL   Alkaline Phosphatase 108 44 - 121 IU/L   AST 13 0 - 40 IU/L   ALT 7 0 - 32 IU/L  Lipid Panel w/o Chol/HDL Ratio  Result Value Ref Range   Cholesterol, Total 188 100 - 199 mg/dL   Triglycerides 86 0 - 149 mg/dL   HDL 67 >39 mg/dL   VLDL Cholesterol Cal 16 5 - 40 mg/dL   LDL Chol Calc (NIH) 105 (H) 0 - 99 mg/dL  Urinalysis, Routine w reflex microscopic  Result Value Ref Range   Specific  Gravity, UA 1.025 1.005 - 1.030  pH, UA 5.5 5.0 - 7.5   Color, UA Yellow Yellow   Appearance Ur Cloudy (A) Clear   Leukocytes,UA 1+ (A) Negative   Protein,UA Negative Negative/Trace   Glucose, UA Negative Negative   Ketones, UA Negative Negative   RBC, UA Negative Negative   Bilirubin, UA Negative Negative   Urobilinogen, Ur 0.2 0.2 - 1.0 mg/dL   Nitrite, UA Negative Negative   Microscopic Examination See below:   TSH  Result Value Ref Range   TSH 2.480 0.450 - 4.500 uIU/mL  Microalbumin, Urine Waived  Result Value Ref Range   Microalb, Ur Waived 10 0 - 19 mg/L   Creatinine, Urine Waived 200 10 - 300 mg/dL   Microalb/Creat Ratio <30 <30 mg/g      Assessment & Plan:   Problem List Items Addressed This Visit       Cardiovascular and Mediastinum   Essential hypertension - Primary    BP running high here. Will work on [employer] and monitor at home. Will bring in BP readings in 1-2 months and recheck.       Relevant Medications   lisinopril (ZESTRIL) 20 MG tablet   Other Relevant Orders   Comprehensive metabolic panel     Endocrine   Hypothyroidism    Rechecking labs today. Await results. Treat as needed.       Relevant Orders   Comprehensive metabolic panel   TSH     Other   Hyperlipidemia    Rechecking labs today. Await results. Treat as needed.       Relevant Medications   lisinopril (ZESTRIL) 20 MG tablet   Other Relevant Orders   Comprehensive metabolic panel   Lipid Panel w/o Chol/HDL Ratio     Follow up plan: Return 4-8 weeks for BP recheck.

## 2020-11-09 NOTE — Assessment & Plan Note (Signed)
Rechecking labs today. Await results. Treat as needed.  °

## 2020-11-09 NOTE — Assessment & Plan Note (Signed)
BP running high here. Will work on [employer] and monitor at home. Will bring in BP readings in 1-2 months and recheck.

## 2020-11-10 ENCOUNTER — Other Ambulatory Visit: Payer: Self-pay | Admitting: Family Medicine

## 2020-11-10 ENCOUNTER — Encounter: Payer: Self-pay | Admitting: Oncology

## 2020-11-10 ENCOUNTER — Encounter: Payer: Self-pay | Admitting: Family Medicine

## 2020-11-10 LAB — TSH: TSH: 3.61 u[IU]/mL (ref 0.450–4.500)

## 2020-11-10 LAB — COMPREHENSIVE METABOLIC PANEL
ALT: 9 IU/L (ref 0–32)
AST: 17 IU/L (ref 0–40)
Albumin/Globulin Ratio: 2 (ref 1.2–2.2)
Albumin: 4.2 g/dL (ref 3.7–4.7)
Alkaline Phosphatase: 74 IU/L (ref 44–121)
BUN/Creatinine Ratio: 9 — ABNORMAL LOW (ref 12–28)
BUN: 9 mg/dL (ref 8–27)
Bilirubin Total: 0.2 mg/dL (ref 0.0–1.2)
CO2: 19 mmol/L — ABNORMAL LOW (ref 20–29)
Calcium: 9.1 mg/dL (ref 8.7–10.3)
Chloride: 95 mmol/L — ABNORMAL LOW (ref 96–106)
Creatinine, Ser: 1.01 mg/dL — ABNORMAL HIGH (ref 0.57–1.00)
Globulin, Total: 2.1 g/dL (ref 1.5–4.5)
Glucose: 83 mg/dL (ref 70–99)
Potassium: 4.3 mmol/L (ref 3.5–5.2)
Sodium: 130 mmol/L — ABNORMAL LOW (ref 134–144)
Total Protein: 6.3 g/dL (ref 6.0–8.5)
eGFR: 59 mL/min/{1.73_m2} — ABNORMAL LOW (ref >59)

## 2020-11-10 LAB — LIPID PANEL W/O CHOL/HDL RATIO
Cholesterol, Total: 189 mg/dL (ref 100–199)
HDL: 62 mg/dL (ref >39)
LDL Chol Calc (NIH): 109 mg/dL — ABNORMAL HIGH (ref 0–99)
Triglycerides: 100 mg/dL (ref 0–149)
VLDL Cholesterol Cal: 18 mg/dL (ref 5–40)

## 2020-11-10 MED ORDER — LEVOTHYROXINE SODIUM 75 MCG PO TABS: 75.0000 ug | ORAL_TABLET | Freq: Every day | ORAL | 3 refills | Status: DC

## 2020-11-18 ENCOUNTER — Other Ambulatory Visit: Payer: Self-pay

## 2020-11-18 ENCOUNTER — Ambulatory Visit: Admission: RE | Admit: 2020-11-18 | Payer: Medicare HMO | Source: Ambulatory Visit

## 2020-11-18 ENCOUNTER — Ambulatory Visit
Admission: RE | Admit: 2020-11-18 | Discharge: 2020-11-18 | Disposition: A | Discharge status: Home / Self Care | Payer: Medicare HMO | Source: Ambulatory Visit | Attending: Radiation Oncology | Admitting: Radiation Oncology

## 2020-11-18 DIAGNOSIS — R911 Solitary pulmonary nodule: Secondary | ICD-10-CM | POA: Diagnosis not present

## 2020-11-18 DIAGNOSIS — J439 Emphysema, unspecified: Secondary | ICD-10-CM | POA: Diagnosis not present

## 2020-11-18 DIAGNOSIS — I700 Atherosclerosis of aorta: Secondary | ICD-10-CM | POA: Diagnosis not present

## 2020-11-18 MED ORDER — IOHEXOL 300 MG/ML  SOLN
75.0000 mL | Freq: Once | INTRAMUSCULAR | Status: AC | PRN
  Administered 2020-11-18: 75 mL via INTRAVENOUS

## 2020-11-22 ENCOUNTER — Other Ambulatory Visit: Payer: Self-pay | Admitting: Family Medicine

## 2020-11-22 NOTE — Telephone Encounter (Signed)
Requested Prescriptions  Pending Prescriptions Disp Refills  . naproxen (NAPROSYN) 500 MG tablet [Pharmacy Med Name: NAPROXEN 500 MG Tablet] 180 tablet 1    Sig: TAKE 1 TABLET TWICE DAILY WITH MEALS     Analgesics:  NSAIDS Failed - 11/22/2020 10:18 AM      Failed - Cr in normal range and within 360 days    Creatinine  Date Value Ref Range Status  05/14/2014 0.78 mg/dL Final    Comment:    0.44-1.00 NOTE: New Reference Range  04/07/14    Creatinine, Ser  Date Value Ref Range Status  11/09/2020 1.01 (H) 0.57 - 1.00 mg/dL Final         Passed - HGB in normal range and within 360 days    Hemoglobin  Date Value Ref Range Status  05/20/2020 11.5 11.1 - 15.9 g/dL Final         Passed - Patient is not pregnant      Passed - Valid encounter within last 12 months    Recent Outpatient Visits          1 week ago Essential hypertension   [place], Megan P, DO   4 months ago [place], [place], DO   5 months ago [place], [place], DO   5 months ago Epigastric pain   [place], [place], DO   5 months ago Osteoarthritis of spine with radiculopathy, lumbar region   [place], [place], DO      Future Appointments            In 1 month [place], Barb Merino, DO [facility], [facility]

## 2020-11-24 ENCOUNTER — Ambulatory Visit
Admission: RE | Admit: 2020-11-24 | Discharge: 2020-11-24 | Disposition: A | Discharge status: Home / Self Care | Payer: Medicare HMO | Source: Ambulatory Visit | Attending: Radiation Oncology | Admitting: Radiation Oncology

## 2020-11-24 ENCOUNTER — Other Ambulatory Visit: Payer: Self-pay | Admitting: *Deleted

## 2020-11-24 ENCOUNTER — Other Ambulatory Visit: Payer: Self-pay

## 2020-11-24 ENCOUNTER — Encounter: Payer: Self-pay | Admitting: Radiation Oncology

## 2020-11-24 VITALS — BP 156/74 | HR 77 | Temp 97.2°F | Resp 16 | Wt 130.7 lb

## 2020-11-24 DIAGNOSIS — R590 Localized enlarged lymph nodes: Secondary | ICD-10-CM | POA: Insufficient documentation

## 2020-11-24 DIAGNOSIS — Z08 Encounter for follow-up examination after completed treatment for malignant neoplasm: Secondary | ICD-10-CM | POA: Diagnosis not present

## 2020-11-24 DIAGNOSIS — R911 Solitary pulmonary nodule: Secondary | ICD-10-CM

## 2020-11-24 DIAGNOSIS — Z923 Personal history of irradiation: Secondary | ICD-10-CM | POA: Insufficient documentation

## 2020-11-24 DIAGNOSIS — R918 Other nonspecific abnormal finding of lung field: Secondary | ICD-10-CM | POA: Insufficient documentation

## 2020-11-24 NOTE — Progress Notes (Signed)
Radiation Oncology Follow up Note  Name: Mary Hoover   Date:   11/24/2020 MRN:  [id number] DOB: [birth year]    This 75 y.o. female presents to the clinic today for 54-month follow-up status post SBRT to her right upper lobe for presumed stage I non-small cell lung cancer.  REFERRING PROVIDER: [name], DO  HPI: Patient is a 75 year old female now at 10 months having completed SBRT to right upper lobe for stage I non-small cell lung cancer she was seen in routine follow-up at 4 months out showing reduction in size of regular solid superior right pulmonary nodule compatible with treatment response.  I ordered another CT scan recently.  Still showing excellent response in previously seen right upper lobe nodule although is now newly enlarged pretracheal lymph node measuring 2.5 x 1.7 cm concerning for metastatic disease.  She also has prominent AP window and right hilar nodes which remain more nonspecific and previously were not FDG avid.  She continues to do well specifically has cough hemoptysis or chest tightness.  COMPLICATIONS OF TREATMENT: none  FOLLOW UP COMPLIANCE: keeps appointments   PHYSICAL EXAM:  BP (!) 156/74   Pulse 77   Temp (!) 97.2 F (36.2 C) (Tympanic)   Resp 16   Wt 130 lb 11.2 oz (59.3 kg)   BMI 19.87 kg/m  Well-developed well-nourished patient in NAD. HEENT reveals PERLA, EOMI, discs not visualized.  Oral cavity is clear. No oral mucosal lesions are identified. Neck is clear without evidence of cervical or supraclavicular adenopathy. Lungs are clear to A&P. Cardiac examination is essentially unremarkable with regular rate and rhythm without murmur rub or thrill. Abdomen is benign with no organomegaly or masses noted. Motor sensory and DTR levels are equal and symmetric in the upper and lower extremities. Cranial nerves II through XII are grossly intact. Proprioception is intact. No peripheral adenopathy or edema is identified. No motor or sensory levels are  noted. Crude visual fields are within normal range.  RADIOLOGY RESULTS: CT scan reviewed compatible with above-stated findings  PLAN: Present time I ordered a PET CT scan to better delineate significance of increasing nodes in her mediastinum.  Should that be positive I will refer her to pulmonology for possible bronchoscopy and biopsy of 1 of these lymph nodes most likely the enlarged pretracheal lymph node.  All this was explained to the patient.  We will see her in follow-up shortly after her PET scan.  Should this be positive recurrence would plan on medical oncology consultation.  I would like to take this opportunity to thank you for allowing me to participate in the care of your patient.Noreene Filbert, MD

## 2020-12-03 ENCOUNTER — Ambulatory Visit: Payer: Medicare HMO

## 2020-12-13 ENCOUNTER — Other Ambulatory Visit: Payer: Self-pay

## 2020-12-13 ENCOUNTER — Ambulatory Visit
Admission: RE | Admit: 2020-12-13 | Discharge: 2020-12-13 | Disposition: A | Discharge status: Home / Self Care | Payer: Medicare HMO | Source: Ambulatory Visit | Attending: Radiation Oncology | Admitting: Radiation Oncology

## 2020-12-13 DIAGNOSIS — I700 Atherosclerosis of aorta: Secondary | ICD-10-CM | POA: Diagnosis not present

## 2020-12-13 DIAGNOSIS — C3490 Malignant neoplasm of unspecified part of unspecified bronchus or lung: Secondary | ICD-10-CM | POA: Diagnosis not present

## 2020-12-13 DIAGNOSIS — J439 Emphysema, unspecified: Secondary | ICD-10-CM | POA: Diagnosis not present

## 2020-12-13 DIAGNOSIS — J432 Centrilobular emphysema: Secondary | ICD-10-CM | POA: Diagnosis not present

## 2020-12-13 DIAGNOSIS — R911 Solitary pulmonary nodule: Secondary | ICD-10-CM | POA: Insufficient documentation

## 2020-12-13 DIAGNOSIS — I2510 Atherosclerotic heart disease of native coronary artery without angina pectoris: Secondary | ICD-10-CM | POA: Diagnosis not present

## 2020-12-13 DIAGNOSIS — K7689 Other specified diseases of liver: Secondary | ICD-10-CM | POA: Diagnosis not present

## 2020-12-13 LAB — GLUCOSE, CAPILLARY: Glucose-Capillary: 79 mg/dL (ref 70–99)

## 2020-12-13 MED ORDER — FLUDEOXYGLUCOSE F - 18 (FDG) INJECTION
6.7000 | Freq: Once | INTRAVENOUS | Status: AC
  Administered 2020-12-13: 7 via INTRAVENOUS

## 2020-12-14 ENCOUNTER — Other Ambulatory Visit: Payer: Self-pay

## 2020-12-14 ENCOUNTER — Telehealth: Payer: Self-pay | Admitting: *Deleted

## 2020-12-14 DIAGNOSIS — R918 Other nonspecific abnormal finding of lung field: Secondary | ICD-10-CM

## 2020-12-14 NOTE — Telephone Encounter (Signed)
Will sent referral for pt to see Dr. Patsey Berthold for a bonchoscopy.   Abby please schedule MD only follow up, this week or next, to go over scan resutls. Please inform pt of appt

## 2020-12-14 NOTE — Telephone Encounter (Signed)
Attempted to call pt. Unable to leave VM. Will try again later.

## 2020-12-14 NOTE — Telephone Encounter (Signed)
Called report  IMPRESSION: 1. Enlarged and hypermetabolic right lower paratracheal lymph node, compatible with metastatic disease. 2. Mild FDG uptake of the bilateral sacral ala with associated lucency. Concerning for sacral insufficiency fractures. This could be further evaluated with MRI of the pelvis. 3. Stable posttreatment changes of the right upper lobe. 4. Nonspecific small solid 3 mm pulmonary nodule of the left lower lobe, too small to assess for FDG avidity. Nodules unchanged compared to most recent prior, but new when compared with more remote priors. Recommend attention on follow-up. 5. No evidence of FDG avid metastatic disease in the abdomen or pelvis. 6. Aortic Atherosclerosis (ICD10-I70.0) and Emphysema (ICD10-J43.9).   These results will be called to the ordering clinician or representative by the Radiologist Assistant, and communication documented in the PACS or [employer].     Electronically Signed   By: Yetta Glassman M.D.   On: 12/14/2020 10:58

## 2020-12-16 NOTE — Telephone Encounter (Signed)
Jenn, will you reach out to pt to schedule MD please.

## 2020-12-21 ENCOUNTER — Encounter: Payer: Self-pay | Admitting: Oncology

## 2020-12-21 ENCOUNTER — Telehealth: Payer: Self-pay | Admitting: Family Medicine

## 2020-12-21 NOTE — Telephone Encounter (Signed)
Copied from [place] [id number]. Topic: Medicare AWV >> Dec 21, 2020  5:08 PM Lavonia Drafts wrote: Reason for CRM: N/A unable to leave a message for patient to call back and schedule the Medicare Annual Wellness Visit (AWV) virtually or by telephone.  Last AWV 04/30/19  Please schedule at anytime with [facility] Health Advisor.  45 minute appointment  Any questions, please call me at [phone]

## 2020-12-27 ENCOUNTER — Inpatient Hospital Stay: Payer: Medicare HMO | Attending: Oncology | Admitting: Oncology

## 2020-12-27 ENCOUNTER — Encounter: Payer: Self-pay | Admitting: Oncology

## 2020-12-27 ENCOUNTER — Encounter: Payer: Self-pay | Admitting: Radiation Oncology

## 2020-12-27 ENCOUNTER — Other Ambulatory Visit: Payer: Self-pay

## 2020-12-27 ENCOUNTER — Ambulatory Visit
Admission: RE | Admit: 2020-12-27 | Discharge: 2020-12-27 | Disposition: A | Discharge status: Home / Self Care | Payer: Medicare HMO | Source: Ambulatory Visit | Attending: Radiation Oncology | Admitting: Radiation Oncology

## 2020-12-27 VITALS — BP 151/75 | HR 80 | Temp 98.4°F | Resp 16 | Wt 133.0 lb

## 2020-12-27 VITALS — BP 151/75 | HR 80 | Temp 98.4°F | Wt 133.9 lb

## 2020-12-27 DIAGNOSIS — R911 Solitary pulmonary nodule: Secondary | ICD-10-CM

## 2020-12-27 DIAGNOSIS — Z833 Family history of diabetes mellitus: Secondary | ICD-10-CM | POA: Diagnosis not present

## 2020-12-27 DIAGNOSIS — Z79899 Other long term (current) drug therapy: Secondary | ICD-10-CM | POA: Diagnosis not present

## 2020-12-27 DIAGNOSIS — C538 Malignant neoplasm of overlapping sites of cervix uteri: Secondary | ICD-10-CM | POA: Diagnosis not present

## 2020-12-27 DIAGNOSIS — Z8249 Family history of ischemic heart disease and other diseases of the circulatory system: Secondary | ICD-10-CM | POA: Insufficient documentation

## 2020-12-27 DIAGNOSIS — Z801 Family history of malignant neoplasm of trachea, bronchus and lung: Secondary | ICD-10-CM | POA: Insufficient documentation

## 2020-12-27 DIAGNOSIS — F17210 Nicotine dependence, cigarettes, uncomplicated: Secondary | ICD-10-CM | POA: Diagnosis not present

## 2020-12-27 DIAGNOSIS — Z7189 Other specified counseling: Secondary | ICD-10-CM | POA: Diagnosis not present

## 2020-12-27 DIAGNOSIS — Z823 Family history of stroke: Secondary | ICD-10-CM | POA: Diagnosis not present

## 2020-12-27 DIAGNOSIS — R590 Localized enlarged lymph nodes: Secondary | ICD-10-CM | POA: Insufficient documentation

## 2020-12-27 DIAGNOSIS — R918 Other nonspecific abnormal finding of lung field: Secondary | ICD-10-CM | POA: Diagnosis not present

## 2020-12-27 DIAGNOSIS — Z08 Encounter for follow-up examination after completed treatment for malignant neoplasm: Secondary | ICD-10-CM | POA: Diagnosis not present

## 2020-12-27 NOTE — Progress Notes (Signed)
Radiation Oncology Follow up Note  Name: Mary Hoover   Date:   12/27/2020 MRN:  [id number] DOB: September 21, 1945    This 75 y.o. female presents to the clinic today for follow-up of PET CT findings in patient now out 11 months status post SBRT to her right upper lobe for presumed stage I non-small cell lung cancer.  REFERRING PROVIDER: [name], DO  HPI: Patient is a 75 year old female now out 11 months having completed SBRT to her right upper lobe for presumed stage I non-small cell lung cancer.  She was seen in follow-up several weeks ago.  Clinically doing fine although CT scan demonstrated excellent response in the right upper lobe nodule although there was a newly enlarged pretracheal lymph node measuring 2.5 cm.  We performed a PET CT scan showing hypermetabolic activity in this node.  She is seeing medical oncology today with the presumed assumption she will be referred to Dr. Patsey Berthold for possible biopsy.  She remains clinically asymptomatic.  COMPLICATIONS OF TREATMENT: none  FOLLOW UP COMPLIANCE: keeps appointments   PHYSICAL EXAM:  BP (!) 151/75   Pulse 80   Temp 98.4 F (36.9 C) (Tympanic)   Wt 133 lb 14.4 oz (60.7 kg)   BMI 20.36 kg/m  Well-developed well-nourished patient in NAD. HEENT reveals PERLA, EOMI, discs not visualized.  Oral cavity is clear. No oral mucosal lesions are identified. Neck is clear without evidence of cervical or supraclavicular adenopathy. Lungs are clear to A&P. Cardiac examination is essentially unremarkable with regular rate and rhythm without murmur rub or thrill. Abdomen is benign with no organomegaly or masses noted. Motor sensory and DTR levels are equal and symmetric in the upper and lower extremities. Cranial nerves II through XII are grossly intact. Proprioception is intact. No peripheral adenopathy or edema is identified. No motor or sensory levels are noted. Crude visual fields are within normal range.  RADIOLOGY RESULTS: PET CT scan  and CT scans reviewed compatible with above-stated findings  PLAN: At this time like to attempt biopsy to determine histology of this almost certain malignant lymph node in her mediastinum.  Should this be positive histology would plan concurrent chemoradiation therapy with Dr. Tasia Catchings.  I have set her up for 2-week follow-up which she gives a sufficient time should she undergo bronchoscopy.  We will coordinate my treatment planning with Dr. Tasia Catchings at that time.  Patient comprehends my recommendations and plan well.  I would like to take this opportunity to thank you for allowing me to participate in the care of your patient.Noreene Filbert, MD

## 2020-12-27 NOTE — Progress Notes (Signed)
Patient here for oncology follow-up appointment, oncerns of arthritis pain

## 2020-12-27 NOTE — Progress Notes (Signed)
Hematology/Oncology note [phone] [phone]   Patient Care Team: [name], DO as PCP - General (Family Medicine) Clent Jacks, RN as Oncology Nurse Navigator Noreene Filbert, MD as Radiation Oncologist (Radiation Oncology)  REFERRING PROVIDER: [name], DO  CHIEF COMPLAINTS/REASON FOR VISIT:  Follow up cervical cancer  HISTORY OF PRESENTING ILLNESS:  Mary Hoover is a  75 y.o.  female with PMH listed below was seen in consultation at the request of  [name], DO  for evaluation of cervical cancer Patient has developed postmenopausal vaginal bleeding and discharge.  She was noted to have a friable cervix/2 cm mass of the cervix, concerning for malignancy 12/19/2018 endometrial biopsy showed scattered atypical squamous cells suspicious for malignancy.  Predominantly necrosis with associated inflammation. 01/01/2019 initial cervix biopsy showed at least high-grade squamous intraepithelial lesion.-HPV negative 01/22/2019, repeat vaginal wall biopsy and cervix 9:00 biopsy showed squamous cell carcinoma.    Staging images 12/31/2018 showed fluid distending the endometrial canal and or endometrial thickening to the level of cervix.  No evidence of pathological lymphadenopathy.  Haziness about the lower cervix.  7 mm irregular pulmonary nodule within the right upper lobe, suspicious for possible primary or metastatic malignancy.  Chronic pleural-parenchymal scarring/fibrosis at the lung apices.  Large amount of stool.  No bowel obstruction. 01/08/2019 PET scan showed marked hypermetabolism in the region of the cervix, compatible with the reported history of cervical cancer. Small bilateral pelvic sidewall lymph nodes discernible FDG accumulation, concerning for metastatic disease although neither lymph node is enlarged by CT size criteria. 7 mm nodule in the right upper lobe shows discernible FDG accumulation.  Questionable neoplasm, primary  versus metastatic. Emphysema.   #Chronic hearing loss # Lung nodule, FDG avid, questionable primary bronchogenic carcinoma versus metastatic cancer. Discussed with radiation oncology.  Her case was also discussed on tumor board.  Consensus reached on finishing concurrent chemoradiation treatments for cervix cancer first. Possible SBRT to lung nodule for presumed primary lung cancer.  # Feb 2021 finished chemotherapy and radiation for treatment of this cervix cancer.  #December 2021 lung nodule,was evaluated by Dr.Oaks. Options of biopsy via transthoracic or endobronchial approach vs surgical resection.  Patient opted to empiric SBRT  INTERVAL HISTORY Mary Hoover is a 75 y.o. female who has above history reviewed by me today presents for follow up visit for management of possible recurrent lung cancer.  History of cervix cancer.   Patient was last seen by me on 06/25/2019.  Lost follow-up. She continues to follow with radiation oncology. 11/20/2020, CT chest with contrast showed minimal residual of previously seen right upper lobe nodule.  0.6 x 0.4 cm.  Internal development of mild bandlike radiation fibrosis.  Newly enlarged pretracheal lymph node, measuring 2.5 x 1.7 cm.  Highly concerning for metastatic disease.  Unchanged prominent AP window and the right hilar lymph nodes.  Emphysema and diffuse bilateral bronchial wall thickening.  Coronary artery disease. 12/13/2020, PET scan showed enlarging hypermetabolic right lower paratracheal lymph node, compatible with metastatic disease.  SUV 10.3.  Mild FDG uptake of the bilateral sacral alla with associated lucency.  Concerning for sacral insufficiency fracture.  Stable posttreatment changes of the right upper lobe nodule.  Nonspecific small solid 3 mm pulmonary nodule of the left lower lobe.  2 small to be characterized.  No evidence of FDG avid metastatic disease in the abdomen or pelvis.  Aortic atherosclerosis and emphysema. Denies shortness  of breath more than her baseline.  No  unintentional weight loss, cough, hemoptysis.    Review of Systems  Constitutional:  Negative for appetite change, chills, fatigue and fever.  HENT:   Positive for hearing loss. Negative for voice change.   Eyes:  Negative for eye problems.  Respiratory:  Negative for chest tightness and cough.   Cardiovascular:  Negative for chest pain.  Gastrointestinal:  Negative for abdominal distention, abdominal pain and blood in stool.  Endocrine: Negative for hot flashes.  Genitourinary:  Negative for difficulty urinating and frequency.   Musculoskeletal:  Negative for arthralgias.  Skin:  Negative for itching and rash.  Neurological:  Negative for extremity weakness.  Hematological:  Negative for adenopathy.  Psychiatric/Behavioral:  Negative for confusion.    MEDICAL HISTORY:  Past Medical History:  Diagnosis Date   Cancer [facility])    Carotid atherosclerosis    Carotid bruit    COPD (chronic obstructive pulmonary disease) ([place])    emphysema   Hyperlipidemia    Hypertension    Hyponatremia 02/27/2019   Hypothyroidism    Infected cat bite 1980s   was hospitalized   Tobacco use     SURGICAL HISTORY: Past Surgical History:  Procedure Laterality Date   CAROTID ENDARTERECTOMY Right Oct. 2015   Dr. Lucky Cowboy   CAROTID STENOSIS Left April 2016   carotid stenosis surgery   COLONOSCOPY WITH PROPOFOL N/A 11/08/2016   Procedure: COLONOSCOPY WITH PROPOFOL;  Surgeon: Jonathon Bellows, MD;  Location: [facility] ENDOSCOPY;  Service: Gastroenterology;  Laterality: N/A;   ESOPHAGOGASTRODUODENOSCOPY (EGD) WITH PROPOFOL N/A 11/08/2016   Procedure: ESOPHAGOGASTRODUODENOSCOPY (EGD) WITH PROPOFOL;  Surgeon: Jonathon Bellows, MD;  Location: [facility] ENDOSCOPY;  Service: Gastroenterology;  Laterality: N/A;   FLEXIBLE BRONCHOSCOPY N/A 12/17/2014   Procedure: FLEXIBLE BRONCHOSCOPY;  Surgeon: Wilhelmina Mcardle, MD;  Location: [facility] ORS;  Service: Pulmonary;  Laterality: N/A;   INCISION AND  DRAINAGE / EXCISION THYROGLOSSAL CYST  April 2011    SOCIAL HISTORY: Social History   Socioeconomic History   Marital status: Married    Spouse name: Not on file   Number of children: Not on file   Years of education: Not on file   Highest education level: 9th grade  Occupational History   Occupation: retired   Tobacco Use   Smoking status: Every Day    Packs/day: 0.50    Years: 50.00    Pack years: 25.00    Types: Cigarettes   Smokeless tobacco: Never   Tobacco comments:    cutting back, not ready to quit   Vaping Use   Vaping Use: Former  Substance and Sexual Activity   Alcohol use: No    Alcohol/week: 0.0 standard drinks   Drug use: No   Sexual activity: Yes  Other Topics Concern   Not on file  Social History Narrative   Lives at home with husband   Social Determinants of Health   Financial Resource Strain: Not on file  Food Insecurity: Not on file  Transportation Needs: Not on file  Physical Activity: Not on file  Stress: Not on file  Social Connections: Not on file  Intimate Partner Violence: Not on file    FAMILY HISTORY: Family History  Problem Relation Age of Onset   Congestive Heart Failure Mother    Heart disease Mother    Hypertension Mother    Hypertension Sister    Diabetes Sister    Heart disease Sister    Cancer Brother        liver, lung   Heart disease Maternal Grandmother  Heart disease Maternal Uncle    Stroke Maternal Grandfather    Heart disease Brother    Hypertension Brother    Heart attack Brother    COPD Neg Hx     ALLERGIES:  is allergic to atorvastatin.  MEDICATIONS:  Current Outpatient Medications  Medication Sig Dispense Refill   diphenhydrAMINE (BENADRYL) 25 mg capsule Take 25 mg by mouth at bedtime as needed.     ferrous sulfate 325 (65 FE) MG EC tablet Take 1 tablet (325 mg total) by mouth daily with breakfast. 90 tablet 4   Fluticasone-Umeclidin-Vilant (TRELEGY ELLIPTA) 100-62.5-25 MCG/INH AEPB Inhale 1 puff  into the lungs daily. 1 each 11   gabapentin (NEURONTIN) 100 MG capsule Take 1 capsule (100 mg total) by mouth 3 (three) times daily. 270 capsule 1   levothyroxine (SYNTHROID) 75 MCG tablet Take 1 tablet (75 mcg total) by mouth daily. 90 tablet 3   lisinopril (ZESTRIL) 20 MG tablet Take 1 tablet (20 mg total) by mouth daily. 90 tablet 1   Multiple Vitamin (MULTIVITAMIN) tablet Take 1 tablet by mouth daily.     naproxen (NAPROSYN) 500 MG tablet TAKE 1 TABLET TWICE DAILY WITH MEALS 180 tablet 1   omeprazole (PRILOSEC) 20 MG capsule Take 1 capsule (20 mg total) by mouth daily. 90 capsule 1   vitamin B-12 (CYANOCOBALAMIN) 500 MCG tablet Take 500 mcg by mouth daily.     clopidogrel (PLAVIX) 75 MG tablet Take 1 tablet (75 mg total) by mouth daily. (Patient not taking: Reported on 12/27/2020) 90 tablet 3   cyclobenzaprine (FLEXERIL) 10 MG tablet Take 1 tablet (10 mg total) by mouth at bedtime. (Patient not taking: Reported on 12/27/2020) 30 tablet 1   No current facility-administered medications for this visit.     PHYSICAL EXAMINATION: ECOG PERFORMANCE STATUS: 1 - Symptomatic but completely ambulatory Vitals:   12/27/20 1041  BP: (!) 151/75  Pulse: 80  Resp: 16  Temp: 98.4 F (36.9 C)   Filed Weights   12/27/20 1041  Weight: 133 lb (60.3 kg)    Physical Exam Constitutional:      General: She is not in acute distress. HENT:     Head: Normocephalic and atraumatic.  Eyes:     General: No scleral icterus.    Pupils: Pupils are equal, round, and reactive to light.  Cardiovascular:     Rate and Rhythm: Normal rate and regular rhythm.     Heart sounds: Normal heart sounds.  Pulmonary:     Effort: Pulmonary effort is normal. No respiratory distress.     Breath sounds: No wheezing.     Comments: Decreased breath sound bilaterally  Abdominal:     General: Bowel sounds are normal. There is no distension.     Palpations: Abdomen is soft. There is no mass.     Tenderness: There is no  abdominal tenderness.  Musculoskeletal:        General: No deformity. Normal range of motion.     Cervical back: Normal range of motion and neck supple.  Skin:    General: Skin is warm and dry.     Findings: No erythema or rash.  Neurological:     Mental Status: She is alert and oriented to person, place, and time. Mental status is at baseline.     Cranial Nerves: No cranial nerve deficit.     Coordination: Coordination normal.  Psychiatric:        Mood and Affect: Mood normal.    LABORATORY DATA:  I have reviewed the data as listed Lab Results  Component Value Date   WBC 5.3 05/20/2020   HGB 11.5 05/20/2020   HCT 33.5 (L) 05/20/2020   MCV 94 05/20/2020   PLT 286 05/20/2020   Recent Labs    05/17/20 1122 05/20/20 1331 11/09/20 1442  NA  --  130* 130*  K  --  4.9 4.3  CL  --  94* 95*  CO2  --  21 19*  GLUCOSE  --  83 83  BUN  --  14 9  CREATININE 0.90 1.05* 1.01*  CALCIUM  --  9.2 9.1  PROT  --  7.0 6.3  ALBUMIN  --  4.3 4.2  AST  --  13 17  ALT  --  7 9  ALKPHOS  --  108 74  BILITOT  --  0.3 0.2    Iron/TIBC/Ferritin/ %Sat    Component Value Date/Time   IRON 55 10/31/2016 1546   TIBC 339 10/31/2016 1546   FERRITIN 18 10/31/2016 1546   IRONPCTSAT 16 10/31/2016 1546      RADIOGRAPHIC STUDIES: I have personally reviewed the radiological images as listed and agreed with the findings in the report. CT Chest W Contrast  Result Date: 11/20/2020 CLINICAL DATA:  Follow-up non-small cell lung cancer, status post radiation therapy to a presumed malignancy of the right upper lobe EXAM: CT CHEST WITH CONTRAST TECHNIQUE: Multidetector CT imaging of the chest was performed during intravenous contrast administration. CONTRAST:  31m OMNIPAQUE IOHEXOL 300 MG/ML  SOLN COMPARISON:  05/17/2020, PET-CT, 11/27/2019 FINDINGS: Cardiovascular: Aortic atherosclerosis. Normal heart size. Three-vessel coronary artery calcifications. No pericardial effusion. Mediastinum/Nodes: Newly  enlarged pretracheal lymph node, measuring 2.5 x 1.7 cm (series 2, image 49). Unchanged prominent AP window node measuring up to 1.5 x 1.0 cm (series 2, image 55) and right hilar node measuring 1.4 x 1.0 cm (series 2, image 62). Thyroid gland, trachea, and esophagus demonstrate no significant findings. Lungs/Pleura: Moderate centrilobular emphysema. Diffuse bilateral bronchial wall thickening. There is a minimal residual of a previously seen right upper lobe nodule, measuring no greater than 0.6 x 0.4 cm, previously 0.8 x 0.6 cm (series 3, image 46). Interval development of mild, bandlike radiation fibrosis in this vicinity. No pleural effusion or pneumothorax. Upper Abdomen: No acute abnormality. Musculoskeletal: No chest wall mass or suspicious bone lesions identified. IMPRESSION: 1. There is a minimal residual of a previously seen right upper lobe nodule, measuring no greater than 0.6 x 0.4 cm, previously 0.8 x 0.6 cm. Interval development of mild, bandlike radiation fibrosis in this vicinity. Findings are consistent with treatment response to radiation therapy. 2. Newly enlarged pretracheal lymph node, measuring 2.5 x 1.7 cm, highly concerning for metastatic disease. 3. Unchanged prominent AP window and right hilar lymph nodes, which remain more nonspecific, previously with equivocal FDG avidity by PET. 4. Emphysema and diffuse bilateral bronchial wall thickening. 5. Coronary artery disease. Aortic Atherosclerosis (ICD10-I70.0). Electronically Signed   By: ADelanna AhmadiM.D.   On: 11/20/2020 10:07   NM PET Image Restag (PS) Skull Base To Thigh  Result Date: 12/14/2020 CLINICAL DATA:  Follow-up treatment strategy for non-small cell lung cancer. EXAM: NUCLEAR MEDICINE PET SKULL BASE TO THIGH TECHNIQUE: 7.0 mCi F-18 FDG was injected intravenously. Full-ring PET imaging was performed from the skull base to thigh after the radiotracer. CT data was obtained and used for attenuation correction and anatomic  localization. Fasting blood glucose: 79 mg/dl COMPARISON:  Chest CT dated November 18, 2020  FINDINGS: Mediastinal blood pool activity: SUV max 2.1 Liver activity: SUV max 2.8 NECK: No hypermetabolic lymph nodes in the neck. Incidental CT findings: none CHEST: Enlarged and hypermetabolic right lower paratracheal lymph node measuring 1.4 cm in short axis on series 3, image 85 with an SUV max of 10.3, unchanged in size when compared with prior in remeasured in similar plane. Unchanged prominent AP window and right hilar lymph nodes which demonstrate no hypermetabolic activity. No additional hypermetabolic lymph nodes seen in the chest. Right upper lobe ground-glass opacities with mild FDG uptake below mediastinal blood pool, likely post radiation change. Tiny solid right upper lobe pulmonary nodule measuring approximately 5 mm on image 81. Pulmonary nodule of the left lower lobe measuring 3 mm on series 3, image 117, unchanged compared to prior, but new when compared with more remote prior exams. Incidental CT findings: Centrilobular emphysema. Three-vessel coronary artery calcifications. Atherosclerotic disease of the thoracic aorta. ABDOMEN/PELVIS: No abnormal hypermetabolic activity within the liver, pancreas, adrenal glands, or spleen. No hypermetabolic lymph nodes in the abdomen or pelvis. Incidental CT findings: Atherosclerotic disease of the thoracic aorta. Calcifications of the liver, likely sequela prior granulomatous infection. SKELETON: Mild diffuse FDG uptake of the bilateral sacral ala with associated lucency, SUV max of 2.6. Otherwise no focal FDG uptake. Incidental CT findings: Unchanged small sclerotic lesions of the left iliac bone, likely bone islands. IMPRESSION: 1. Enlarged and hypermetabolic right lower paratracheal lymph node, compatible with metastatic disease. 2. Mild FDG uptake of the bilateral sacral ala with associated lucency. Concerning for sacral insufficiency fractures. This could be  further evaluated with MRI of the pelvis. 3. Stable posttreatment changes of the right upper lobe. 4. Nonspecific small solid 3 mm pulmonary nodule of the left lower lobe, too small to assess for FDG avidity. Nodules unchanged compared to most recent prior, but new when compared with more remote priors. Recommend attention on follow-up. 5. No evidence of FDG avid metastatic disease in the abdomen or pelvis. 6. Aortic Atherosclerosis (ICD10-I70.0) and Emphysema (ICD10-J43.9). These results will be called to the ordering clinician or representative by the Radiologist Assistant, and communication documented in the PACS or [employer]. Electronically Signed   By: Yetta Glassman M.D.   On: 12/14/2020 10:58       ASSESSMENT & PLAN:  1. Malignant neoplasm of overlapping sites of cervix ([place])   2. Paratracheal lymphadenopathy   3. Goals of care, counseling/discussion    #Locally advanced cervical cancer stage IIIC1 with pelvic nodes involvement. S/p concurrent chemotherapy and radiation followed by brachytherapy.  Recommend patient to re -establish care with gynecology oncology for pelvic examination..   # Persistent right lung nodule 68m, status post empiric SBRT. Enlarging and hypermetabolic peritracheal lymphadenopathy CT CT and PET scan was independently reviewed by me and discussed with patient. Suspected metastatic disease. I recommend patient to establish care with pulmonology for evaluation of biopsy via bronchoscopy.  She agrees.  Will refer to Dr. GPatsey Berthold If confirmed to be primary lung cancer, patient has stage III disease and plan concurrent chemoradiation.  All questions were answered. The patient knows to call the clinic with any problems questions or concerns.   Return of visit: to be determined.    ZEarlie Server MD, PhD 12/27/2020

## 2021-01-04 ENCOUNTER — Other Ambulatory Visit: Payer: Self-pay

## 2021-01-04 ENCOUNTER — Ambulatory Visit (INDEPENDENT_AMBULATORY_CARE_PROVIDER_SITE_OTHER): Payer: Medicare HMO | Admitting: Family Medicine

## 2021-01-04 ENCOUNTER — Encounter: Payer: Self-pay | Admitting: Family Medicine

## 2021-01-04 VITALS — BP 130/71 | HR 75 | Temp 98.2°F | Wt 133.6 lb

## 2021-01-04 DIAGNOSIS — M8448XA Pathological fracture, other site, initial encounter for fracture: Secondary | ICD-10-CM | POA: Diagnosis not present

## 2021-01-04 DIAGNOSIS — I700 Atherosclerosis of aorta: Secondary | ICD-10-CM

## 2021-01-04 DIAGNOSIS — Z13820 Encounter for screening for osteoporosis: Secondary | ICD-10-CM | POA: Diagnosis not present

## 2021-01-04 DIAGNOSIS — Z23 Encounter for immunization: Secondary | ICD-10-CM | POA: Diagnosis not present

## 2021-01-04 DIAGNOSIS — I10 Essential (primary) hypertension: Secondary | ICD-10-CM | POA: Diagnosis not present

## 2021-01-04 NOTE — Progress Notes (Signed)
BP 130/71   Pulse 75   Temp 98.2 F (36.8 C)   Wt 133 lb 9.6 oz (60.6 kg)   SpO2 97%   BMI 20.31 kg/m    Subjective:    Patient ID: Mary Hoover, female    DOB: 07-25-1945, 75 y.o.   MRN: [id number]  HPI: Mary Hoover is a 75 y.o. female  Chief Complaint  Patient presents with   Hypertension   HYPERTENSION Hypertension status: controlled  Satisfied with current treatment? yes Duration of hypertension: chronic BP monitoring frequency:  not checking BP medication side effects:  no Medication compliance: excellent compliance Aspirin: no Recurrent headaches: no Visual changes: no Palpitations: no Dyspnea: no Chest pain: no Lower extremity edema: no Dizzy/lightheaded: no  Relevant past medical, surgical, family and social history reviewed and updated as indicated. Interim medical history since our last visit reviewed. Allergies and medications reviewed and updated.  Review of Systems  Constitutional: Negative.   Respiratory: Negative.    Cardiovascular: Negative.   Musculoskeletal:  Positive for back pain and myalgias. Negative for arthralgias, gait problem, joint swelling, neck pain and neck stiffness.  Skin: Negative.   Neurological: Negative.   Psychiatric/Behavioral: Negative.     Per HPI unless specifically indicated above     Objective:    BP 130/71   Pulse 75   Temp 98.2 F (36.8 C)   Wt 133 lb 9.6 oz (60.6 kg)   SpO2 97%   BMI 20.31 kg/m   Wt Readings from Last 3 Encounters:  01/04/21 133 lb 9.6 oz (60.6 kg)  12/27/20 133 lb 14.4 oz (60.7 kg)  12/27/20 133 lb (60.3 kg)    Physical Exam Vitals and nursing note reviewed.  Constitutional:      General: She is not in acute distress.    Appearance: Normal appearance. She is normal weight. She is not ill-appearing, toxic-appearing or diaphoretic.  HENT:     Head: Normocephalic and atraumatic.     Right Ear: External ear normal.     Left Ear: External ear normal.     Nose: Nose  normal.     Mouth/Throat:     Mouth: Mucous membranes are moist.     Pharynx: Oropharynx is clear.  Eyes:     General: No scleral icterus.       Right eye: No discharge.        Left eye: No discharge.     Extraocular Movements: Extraocular movements intact.     Conjunctiva/sclera: Conjunctivae normal.     Pupils: Pupils are equal, round, and reactive to light.  Cardiovascular:     Rate and Rhythm: Normal rate and regular rhythm.     Pulses: Normal pulses.     Heart sounds: Normal heart sounds. No murmur heard.   No friction rub. No gallop.  Pulmonary:     Effort: Pulmonary effort is normal. No respiratory distress.     Breath sounds: Normal breath sounds. No stridor. No wheezing, rhonchi or rales.  Chest:     Chest wall: No tenderness.  Musculoskeletal:        General: Normal range of motion.     Cervical back: Normal range of motion and neck supple.  Skin:    General: Skin is warm and dry.     Capillary Refill: Capillary refill takes less than 2 seconds.     Coloration: Skin is not jaundiced or pale.     Findings: No bruising, erythema, lesion or rash.  Neurological:  General: No focal deficit present.     Mental Status: She is alert and oriented to person, place, and time. Mental status is at baseline.  Psychiatric:        Mood and Affect: Mood normal.        Behavior: Behavior normal.        Thought Content: Thought content normal.        Judgment: Judgment normal.    Results for orders placed or performed during the hospital encounter of 12/13/20  Glucose, capillary  Result Value Ref Range   Glucose-Capillary 79 70 - 99 mg/dL      Assessment & Plan:   Problem List Items Addressed This Visit       Cardiovascular and Mediastinum   Essential hypertension - Primary    Under good control on current regimen. Continue current regimen. Continue to monitor. Call with any concerns. Refills given. Labs drawn today.       Relevant Orders   Basic metabolic panel    Aortic atherosclerosis ([place])    Will keep BP and cholesterol under good control. Continue to monitor. Call with any concerns.       Other Visit Diagnoses     Sacral insufficiency fracture, initial encounter       Will check MRI and get DEXA. Await results.    Relevant Orders   MR Lumbar Spine Wo Contrast   DG Bone Density   Screening for osteoporosis       Concern for sacral insuffiency fracture. Will get DEXA. Await results.    Relevant Orders   DG Bone Density   Needs flu shot       Relevant Orders   Flu Vaccine QUAD High Dose(Fluad)        Follow up plan: Return April physical.

## 2021-01-04 NOTE — Patient Instructions (Addendum)
You are scheduled for your mammogram and bone density scans on 01/25/21 at 1:00 pm at [facility]. Their phone number is [phone].

## 2021-01-04 NOTE — Assessment & Plan Note (Signed)
Under good control on current regimen. Continue current regimen. Continue to monitor. Call with any concerns. Refills given. Labs drawn today.   

## 2021-01-04 NOTE — Assessment & Plan Note (Signed)
Will keep BP and cholesterol under good control. Continue to monitor. Call with any concerns.

## 2021-01-05 ENCOUNTER — Encounter: Payer: Self-pay | Admitting: Family Medicine

## 2021-01-05 ENCOUNTER — Encounter: Payer: Self-pay | Admitting: Pulmonary Disease

## 2021-01-05 ENCOUNTER — Encounter: Payer: Self-pay | Admitting: Oncology

## 2021-01-05 ENCOUNTER — Telehealth: Payer: Self-pay | Admitting: Pulmonary Disease

## 2021-01-05 ENCOUNTER — Ambulatory Visit (INDEPENDENT_AMBULATORY_CARE_PROVIDER_SITE_OTHER): Payer: Medicare HMO | Admitting: Pulmonary Disease

## 2021-01-05 VITALS — BP 134/78 | HR 74 | Temp 97.9°F | Ht 68.0 in | Wt 132.4 lb

## 2021-01-05 DIAGNOSIS — J449 Chronic obstructive pulmonary disease, unspecified: Secondary | ICD-10-CM

## 2021-01-05 DIAGNOSIS — F17210 Nicotine dependence, cigarettes, uncomplicated: Secondary | ICD-10-CM

## 2021-01-05 DIAGNOSIS — C538 Malignant neoplasm of overlapping sites of cervix uteri: Secondary | ICD-10-CM

## 2021-01-05 DIAGNOSIS — R590 Localized enlarged lymph nodes: Secondary | ICD-10-CM

## 2021-01-05 LAB — BASIC METABOLIC PANEL
BUN/Creatinine Ratio: 13 (ref 12–28)
BUN: 13 mg/dL (ref 8–27)
CO2: 22 mmol/L (ref 20–29)
Calcium: 9.3 mg/dL (ref 8.7–10.3)
Chloride: 96 mmol/L (ref 96–106)
Creatinine, Ser: 0.97 mg/dL (ref 0.57–1.00)
Glucose: 74 mg/dL (ref 70–99)
Potassium: 4.5 mmol/L (ref 3.5–5.2)
Sodium: 132 mmol/L — ABNORMAL LOW (ref 134–144)
eGFR: 62 mL/min/{1.73_m2} (ref >59)

## 2021-01-05 NOTE — Patient Instructions (Signed)
We have scheduled you for your biopsy on Wednesday, December 14 at 12:30 PM.  You will be given instructions for more details on what to expect.  Make sure you have someone who can drive you to the procedure and back.   The procedure itself takes approximately 20 to 30 minutes however you will be in the preoperative area and postoperative area for some time so allow for the afternoon to be taken with this process.  You did well with your walking oxygen level today.  Sure you use your Trelegy daily.  Make sure you rinse your mouth well after you use it.  We will see you in follow-up in 4 to 6 weeks time we will however keep you posted with the results of the biopsy.  I will also discuss with Dr. Tasia Catchings and Dr. Baruch Gouty.

## 2021-01-05 NOTE — Telephone Encounter (Signed)
Lm for reminder to stop Plavix 5 days prior to bx. Last dose will be 01/07/2021

## 2021-01-05 NOTE — Telephone Encounter (Signed)
EBUS scheduled 01/12/2021 at 12:30. [id number], [id number] DX: Lung nodule  Rodena Piety, please see bronch info.

## 2021-01-05 NOTE — H&P (View-Only) (Signed)
Subjective:    Patient ID: Mary Hoover, female    DOB: May 08, 1945, 75 y.o.   MRN: [id number] Chief Complaint  Patient presents with   pulmonary consult    Per Mary Hoover-- recent PET 12/14/2020. Prod cough with clear to white sputum.     HPI Patient is a 75 year old current smoker (0.5 PPD, 87.5 PY) with a history as noted below, referred for right paratracheal adenopathy, FDG avid, suspicious for metastatic disease.  She is kindly referred by Mary Hoover and Mary [facility].  Patient has been previously treated for a right upper lobe spiculated nodule that had grown in size and shown FDG activity.  SBRT was performed to this area without prior biopsy due to the location of the lesion.  At the time the lesion was noted advanced bronchoscopic (robotic) navigational techniques were not available and patient was noted to be high risk for VATS.  Patient had opted observation.  The patient also has had a history of colon cancer and received concurrent chemotherapy and radiation for treatment of her cervical cancer.  Follow-up imaging showed in October 2022 new mediastinal adenopathy as noted above.  This has been FDG avid in the dominant paratracheal node.  Patient showed good response from prior SBRT to the right upper lobe.  Patient has been asymptomatic with regards to her mediastinal adenopathy.  She has not noted any weight loss or anorexia.  No fevers, chills or sweats.  No cough of note.  No hemoptysis.  She does have issues with mild to moderate dyspnea on heavy exertion only.  She can perform activities of daily living without difficulty.  She does not require oxygen.  She unfortunately continues to smoke but has actually been cutting back.  She is on Trelegy Ellipta for management of her COPD.  On Plavix but has been able to hold off on this previously for procedures.  He does not have any orthopnea, paroxysmal nocturnal dyspnea, lower extremity edema or calf tenderness.  No chest pain.  She  does not endorse any other symptomatology.  PRIOR PULMONARY HISTORY: 12/11/14 - 12/12/14 Hospitalized for hemoptysis. CT chest revealed LUL nodular infiltrate 12/12/14 Pulmonary consultation. CT chest findings noted. Pt was on ASA, clopidogrel which were held. Pt discharged on course of abx and pred with plan for outpt FOB 12/17/14 FOB: no active bleeding. Washings, brushings, TBBx in LUL were negative for cytology. Pt instructed to resume ASA and remain off clopidogrel until seen in office 12/28/14 Office follow up: counseled re: smoking cessation. Repeat CT chest ordered 02/02/15 CT chest: complete resolution of confluent airspace opacities/ground-glass opacities of the left upper lobe. Extensive centrilobular and paraseptal emphysema, most pronounced at the lung apices. Evidence of prior granulomatous disease with calcified granulomas of the right lung and the liver. 02/25/15 Office follow up: Ct findings reviewed with patient. Smoking cessation counseling again provided 04/02/15 PFTs: mild obstruction, moderately to severely reduced DLCO.  FEV1 1.81 L or 66% predicted, FVC 2.51 L or 70% predicted, FEV1/FVC 72% no significant bronchodilator response 04/05/15 ROV: No new complaints. Unable to quit smoking without medical assistance. Chantix initiated 05/05/2019: Evaluation by Mary Hoover for consideration of resection of a 7 mm nodule in the right upper lobe 05/12/2019 PFTs: FEV1 1.68 L or 64% predicted, FVC 2.38 L or 69% predicted, FEV1/FVC 71%.  No bronchodilator response no hyperinflation or air trapping.  Moderate reduction in DLCO overall stable from prior but improvement on DLCO 05/16/2019: Follow-up with Mary Hoover, patient opted  for continued observation with SBRT if lesion continued to grow 12/03/2019: Follow-up with Mary Hoover, SBRT to the right upper lobe lesion recommended due to increased growth and persistent FDG activity 05/17/2020 chest CT with contrast: Decrease in the right  upper lobe spiculated lesion indicating treatment response, specifically no pathology, mediastinal adenopathy noted 11/18/2020 CT chest with contrast: Newly enlarged paratracheal lymph node measuring 2.5 x 1.7 cm, prominent AP window node 1.5 x 1.0 cm and right hilar node measuring 1.4 x 1.0 cm, radiation fibrosis to the area of prior SBRT in the right upper lobe.  Adenopathy concerning for metastatic disease 12/13/2020 PET/CT: Increased FDG activity of the right paratracheal lymph node, post treatment changes in the right upper lobe, right hilar node and AP window node without FDG activity   Review of Systems A 10 point review of systems was performed and it is as noted above otherwise negative.  Past Medical History:  Diagnosis Date   Cancer [facility])    Carotid atherosclerosis    Carotid bruit    COPD (chronic obstructive pulmonary disease) (HCC)    emphysema   Hyperlipidemia    Hypertension    Hyponatremia 02/27/2019   Hypothyroidism    Infected cat bite 1980s   was hospitalized   Tobacco use    Past Surgical History:  Procedure Laterality Date   CAROTID ENDARTERECTOMY Right Oct. 2015   Mary [facility]   CAROTID STENOSIS Left April 2016   carotid stenosis surgery   COLONOSCOPY WITH PROPOFOL N/A 11/08/2016   Procedure: COLONOSCOPY WITH PROPOFOL;  Surgeon: Mary Bellows, MD;  Location: [facility] ENDOSCOPY;  Service: Gastroenterology;  Laterality: N/A;   ESOPHAGOGASTRODUODENOSCOPY (EGD) WITH PROPOFOL N/A 11/08/2016   Procedure: ESOPHAGOGASTRODUODENOSCOPY (EGD) WITH PROPOFOL;  Surgeon: Mary Bellows, MD;  Location: [facility] ENDOSCOPY;  Service: Gastroenterology;  Laterality: N/A;   FLEXIBLE BRONCHOSCOPY N/A 12/17/2014   Procedure: FLEXIBLE BRONCHOSCOPY;  Surgeon: Mary Mcardle, MD;  Location: [facility];  Service: Pulmonary;  Laterality: N/A;   INCISION AND DRAINAGE / EXCISION THYROGLOSSAL CYST  April 2011   Family History  Problem Relation Age of Onset   Congestive Heart Failure Mother    Heart  disease Mother    Hypertension Mother    Hypertension Sister    Diabetes Sister    Heart disease Sister    Cancer Brother        liver, lung   Heart disease Maternal Grandmother    Heart disease Maternal Uncle    Stroke Maternal Grandfather    Heart disease Brother    Hypertension Brother    Heart attack Brother    COPD Neg Hx    Social History   Tobacco Use   Smoking status: Every Day    Packs/day: 1.75    Years: 50.00    Pack years: 87.50    Types: Cigarettes   Smokeless tobacco: Never   Tobacco comments:    0.5 PPD 01/05/2021  Substance Use Topics   Alcohol use: No    Alcohol/week: 0.0 standard drinks   Allergies  Allergen Reactions   Atorvastatin Other (See Comments)    dizziness   Current Meds  Medication Sig   clopidogrel (PLAVIX) 75 MG tablet Take 1 tablet (75 mg total) by mouth daily.   cyclobenzaprine (FLEXERIL) 10 MG tablet Take 1 tablet (10 mg total) by mouth at bedtime. (Patient not taking: Reported on 01/05/2021)   ferrous sulfate 325 (65 FE) MG EC tablet Take 1 tablet (325 mg total) by mouth daily with breakfast.  Fluticasone-Umeclidin-Vilant (TRELEGY ELLIPTA) 100-62.5-25 MCG/INH AEPB Inhale 1 puff into the lungs daily.   gabapentin (NEURONTIN) 100 MG capsule Take 1 capsule (100 mg total) by mouth 3 (three) times daily.   levothyroxine (SYNTHROID) 75 MCG tablet Take 1 tablet (75 mcg total) by mouth daily.   lisinopril (ZESTRIL) 20 MG tablet Take 1 tablet (20 mg total) by mouth daily.   Multiple Vitamin (MULTIVITAMIN) tablet Take 1 tablet by mouth daily.   naproxen (NAPROSYN) 500 MG tablet TAKE 1 TABLET TWICE DAILY WITH MEALS   omeprazole (PRILOSEC) 20 MG capsule Take 1 capsule (20 mg total) by mouth daily.   vitamin B-12 (CYANOCOBALAMIN) 500 MCG tablet Take 500 mcg by mouth daily.   [DISCONTINUED] diphenhydrAMINE (BENADRYL) 25 mg capsule Take 25 mg by mouth at bedtime as needed. (Patient not taking: Reported on 01/05/2021)   Immunization History   Administered Date(s) Administered   Fluad Quad(high Dose 65+) 11/26/2018, 11/14/2019, 01/04/2021   Influenza, High Dose Seasonal PF 01/11/2016, 11/16/2016, 10/16/2017   Influenza,inj,Quad PF,6+ Mos 11/25/2014   Janssen (J&J) SARS-COV-2 Vaccination 08/16/2019   Pneumococcal Conjugate-13 07/13/2015   Pneumococcal Polysaccharide-23 07/10/2012   Tdap 07/13/2015       Objective:   Physical Exam BP 134/78 (BP Location: Left Arm, Cuff Size: Normal)    Pulse 74    Temp 97.9 F (36.6 C) (Temporal)    Ht _0  (1.727 m)    Wt 132 lb 6.4 oz (60.1 kg)    SpO2 97%    BMI 20.13 kg/m  GENERAL: Thin well-developed woman, no acute distress.  No conversational dyspnea.  Fully ambulatory. HEAD: Normocephalic, atraumatic.  EYES: Pupils equal, round, reactive to light.  No scleral icterus.  MOUTH: Nose/mouth/throat not examined due to masking requirements for COVID 19. NECK: Supple. No thyromegaly. Trachea midline. No JVD.  No adenopathy. PULMONARY: Good air entry bilaterally.  Coarse with no other adventitious sounds. CARDIOVASCULAR: S1 and S2. Regular rate and rhythm.  No rubs, murmurs or gallops heard. ABDOMEN: Benign. MUSCULOSKELETAL: No joint deformity, no clubbing, no edema.  NEUROLOGIC: No focal deficit, no gait disturbance, speech is fluent. SKIN: Intact,warm,dry. PSYCH: Mood and behavior normal  Ambulatory oximetry was performed today: Patient maintained oxygen saturations at 100 to 97%.  She ambulated 500 feet mostly stopped due to back pain.  Representative image of CT chest with contrast performed 18 November 2020, reviewed independently and with the patient, enlarged paratracheal adenopathy as noted:   Representative image of PET/CT performed 13 December 2020 showing intense FDG activity on the right paratracheal adenopathy:      Assessment & Plan:     ICD-10-CM   1. Paratracheal lymphadenopathy  R59.0    FDG avid Malignancy until proven otherwise Will need diagnosis by  EBUS/TBNA Hold Plavix 5 days prior to procedure    2. Stage 2 moderate COPD by GOLD classification ([place])  J44.9    Continue Trelegy Continue as needed albuterol Appears well compensated Recommend smoking cessation    3. Malignant neoplasm of overlapping sites of cervix [facility])  C53.8    This issue adds complexity to her management Being managed by oncology    4. Tobacco dependence due to cigarettes  F17.210    Patient counseled regards to discontinuation of smoking     Benefits, limitations and potential complications of the procedure were discussed with the patient/family.  Complications from bronchoscopy are rare and most often minor, but if they occur they may include breathing difficulty, vocal cord spasm, hoarseness, slight fever, vomiting, dizziness,  bronchospasm, infection, low blood oxygen, bleeding from biopsy site, or an allergic reaction to medications.  It is uncommon for patients to experience other more serious complications for example: Collapsed lung requiring chest tube placement, respiratory failure, heart attack and/or cardiac arrhythmia.  Patient is aware that the procedure will be done under general anesthesia.  Patient agrees to proceed.   The patient will need endobronchial ultrasound with TBNA for diagnosis of right paratracheal adenopathy that is malignancy until proven otherwise.  She has prior history of SBRT to the right upper lobe and a prior history of cervical cancer.  We will proceed with EBUS with TBNA on 14 December at 1245 hrs.  The patient is aware that she will need to hold Plavix for 5 days prior to the procedure.  We will see her in follow-up in 4 to 6 weeks time but will communicate with her via phone with results of the biopsy.  Renold Don, MD Advanced Bronchoscopy PCCM [facility]    *This note was dictated using voice recognition software/Dragon.  Despite best efforts to proofread, errors can occur which can change the  meaning.  Any change was purely unintentional.

## 2021-01-05 NOTE — Telephone Encounter (Signed)
For the codes [phone], [id number] Prior Auth Not Required  Refer # [id number]

## 2021-01-05 NOTE — Progress Notes (Signed)
Subjective:    Patient ID: Mary [facility], female    DOB: 1945-11-06, 75 y.o.   MRN: [id number] Chief Complaint  Patient presents with   pulmonary consult    Per Dr. Tasia Catchings-- recent PET 12/14/2020. Prod cough with clear to white sputum.     HPI Patient is a 75 year old current smoker (0.5 PPD, 87.5 PY) with a history as noted below, referred for right paratracheal adenopathy, FDG avid, suspicious for metastatic disease.  She is kindly referred by Dr. [name] and Dr. Noreene Filbert.  Patient has been previously treated for a right upper lobe spiculated nodule that had grown in size and shown FDG activity.  SBRT was performed to this area without prior biopsy due to the location of the lesion.  At the time the lesion was noted advanced bronchoscopic (robotic) navigational techniques were not available and patient was noted to be high risk for VATS.  Patient had opted observation.  The patient also has had a history of colon cancer and received concurrent chemotherapy and radiation for treatment of her cervical cancer.  Follow-up imaging showed in October 2022 new mediastinal adenopathy as noted above.  This has been FDG avid in the dominant paratracheal node.  Patient showed good response from prior SBRT to the right upper lobe.  Patient has been asymptomatic with regards to her mediastinal adenopathy.  She has not noted any weight loss or anorexia.  No fevers, chills or sweats.  No cough of note.  No hemoptysis.  She does have issues with mild to moderate dyspnea on heavy exertion only.  She can perform activities of daily living without difficulty.  She does not require oxygen.  She unfortunately continues to smoke but has actually been cutting back.  She is on Trelegy Ellipta for management of her COPD.  On Plavix but has been able to hold off on this previously for procedures.  He does not have any orthopnea, paroxysmal nocturnal dyspnea, lower extremity edema or calf tenderness.  No chest pain.  She  does not endorse any other symptomatology.  PRIOR PULMONARY HISTORY: 12/11/14 - 12/12/14 Hospitalized for hemoptysis. CT chest revealed LUL nodular infiltrate 12/12/14 Pulmonary consultation. CT chest findings noted. Pt was on ASA, clopidogrel which were held. Pt discharged on course of abx and pred with plan for outpt FOB 12/17/14 FOB: no active bleeding. Washings, brushings, TBBx in LUL were negative for cytology. Pt instructed to resume ASA and remain off clopidogrel until seen in office 12/28/14 Office follow up: counseled re: smoking cessation. Repeat CT chest ordered 02/02/15 CT chest: complete resolution of confluent airspace opacities/ground-glass opacities of the left upper lobe. Extensive centrilobular and paraseptal emphysema, most pronounced at the lung apices. Evidence of prior granulomatous disease with calcified granulomas of the right lung and the liver. 02/25/15 Office follow up: Ct findings reviewed with patient. Smoking cessation counseling again provided 04/02/15 PFTs: mild obstruction, moderately to severely reduced DLCO.  FEV1 1.81 L or 66% predicted, FVC 2.51 L or 70% predicted, FEV1/FVC 72% no significant bronchodilator response 04/05/15 ROV: No new complaints. Unable to quit smoking without medical assistance. Chantix initiated 05/05/2019: Evaluation by Dr. Nestor Lewandowsky for consideration of resection of a 7 mm nodule in the right upper lobe 05/12/2019 PFTs: FEV1 1.68 L or 64% predicted, FVC 2.38 L or 69% predicted, FEV1/FVC 71%.  No bronchodilator response no hyperinflation or air trapping.  Moderate reduction in DLCO overall stable from prior but improvement on DLCO 05/16/2019: Follow-up with Dr. Genevive Bi, patient opted  for continued observation with SBRT if lesion continued to grow 12/03/2019: Follow-up with Dr. Baruch Gouty, SBRT to the right upper lobe lesion recommended due to increased growth and persistent FDG activity 05/17/2020 chest CT with contrast: Decrease in the right  upper lobe spiculated lesion indicating treatment response, specifically no pathology, mediastinal adenopathy noted 11/18/2020 CT chest with contrast: Newly enlarged paratracheal lymph node measuring 2.5 x 1.7 cm, prominent AP window node 1.5 x 1.0 cm and right hilar node measuring 1.4 x 1.0 cm, radiation fibrosis to the area of prior SBRT in the right upper lobe.  Adenopathy concerning for metastatic disease 12/13/2020 PET/CT: Increased FDG activity of the right paratracheal lymph node, post treatment changes in the right upper lobe, right hilar node and AP window node without FDG activity   Review of Systems A 10 point review of systems was performed and it is as noted above otherwise negative.  Past Medical History:  Diagnosis Date   Cancer [facility])    Carotid atherosclerosis    Carotid bruit    COPD (chronic obstructive pulmonary disease) (HCC)    emphysema   Hyperlipidemia    Hypertension    Hyponatremia 02/27/2019   Hypothyroidism    Infected cat bite 1980s   was hospitalized   Tobacco use    Past Surgical History:  Procedure Laterality Date   CAROTID ENDARTERECTOMY Right Oct. 2015   Dr. Lucky Cowboy   CAROTID STENOSIS Left April 2016   carotid stenosis surgery   COLONOSCOPY WITH PROPOFOL N/A 11/08/2016   Procedure: COLONOSCOPY WITH PROPOFOL;  Surgeon: Jonathon Bellows, MD;  Location: [facility] ENDOSCOPY;  Service: Gastroenterology;  Laterality: N/A;   ESOPHAGOGASTRODUODENOSCOPY (EGD) WITH PROPOFOL N/A 11/08/2016   Procedure: ESOPHAGOGASTRODUODENOSCOPY (EGD) WITH PROPOFOL;  Surgeon: Jonathon Bellows, MD;  Location: [facility] ENDOSCOPY;  Service: Gastroenterology;  Laterality: N/A;   FLEXIBLE BRONCHOSCOPY N/A 12/17/2014   Procedure: FLEXIBLE BRONCHOSCOPY;  Surgeon: Wilhelmina Mcardle, MD;  Location: [facility] ORS;  Service: Pulmonary;  Laterality: N/A;   INCISION AND DRAINAGE / EXCISION THYROGLOSSAL CYST  April 2011   Family History  Problem Relation Age of Onset   Congestive Heart Failure Mother    Heart  disease Mother    Hypertension Mother    Hypertension Sister    Diabetes Sister    Heart disease Sister    Cancer Brother        liver, lung   Heart disease Maternal Grandmother    Heart disease Maternal Uncle    Stroke Maternal Grandfather    Heart disease Brother    Hypertension Brother    Heart attack Brother    COPD Neg Hx    Social History   Tobacco Use   Smoking status: Every Day    Packs/day: 1.75    Years: 50.00    Pack years: 87.50    Types: Cigarettes   Smokeless tobacco: Never   Tobacco comments:    0.5 PPD 01/05/2021  Substance Use Topics   Alcohol use: No    Alcohol/week: 0.0 standard drinks   Allergies  Allergen Reactions   Atorvastatin Other (See Comments)    dizziness   Current Meds  Medication Sig   clopidogrel (PLAVIX) 75 MG tablet Take 1 tablet (75 mg total) by mouth daily.   cyclobenzaprine (FLEXERIL) 10 MG tablet Take 1 tablet (10 mg total) by mouth at bedtime. (Patient not taking: Reported on 01/05/2021)   ferrous sulfate 325 (65 FE) MG EC tablet Take 1 tablet (325 mg total) by mouth daily with breakfast.  Fluticasone-Umeclidin-Vilant (TRELEGY ELLIPTA) 100-62.5-25 MCG/INH AEPB Inhale 1 puff into the lungs daily.   gabapentin (NEURONTIN) 100 MG capsule Take 1 capsule (100 mg total) by mouth 3 (three) times daily.   levothyroxine (SYNTHROID) 75 MCG tablet Take 1 tablet (75 mcg total) by mouth daily.   lisinopril (ZESTRIL) 20 MG tablet Take 1 tablet (20 mg total) by mouth daily.   Multiple Vitamin (MULTIVITAMIN) tablet Take 1 tablet by mouth daily.   naproxen (NAPROSYN) 500 MG tablet TAKE 1 TABLET TWICE DAILY WITH MEALS   omeprazole (PRILOSEC) 20 MG capsule Take 1 capsule (20 mg total) by mouth daily.   vitamin B-12 (CYANOCOBALAMIN) 500 MCG tablet Take 500 mcg by mouth daily.   [DISCONTINUED] diphenhydrAMINE (BENADRYL) 25 mg capsule Take 25 mg by mouth at bedtime as needed. (Patient not taking: Reported on 01/05/2021)   Immunization History   Administered Date(s) Administered   Fluad Quad(high Dose 65+) 11/26/2018, 11/14/2019, 01/04/2021   Influenza, High Dose Seasonal PF 01/11/2016, 11/16/2016, 10/16/2017   Influenza,inj,Quad PF,6+ Mos 11/25/2014   Janssen (J&J) SARS-COV-2 Vaccination 08/16/2019   Pneumococcal Conjugate-13 07/13/2015   Pneumococcal Polysaccharide-23 07/10/2012   Tdap 07/13/2015       Objective:   Physical Exam BP 134/78 (BP Location: Left Arm, Cuff Size: Normal)   Pulse 74   Temp 97.9 F (36.6 C) (Temporal)   Ht _0  (1.727 m)   Wt 132 lb 6.4 oz (60.1 kg)   SpO2 97%   BMI 20.13 kg/m  GENERAL: Thin well-developed woman, no acute distress.  No conversational dyspnea.  Fully ambulatory. HEAD: Normocephalic, atraumatic.  EYES: Pupils equal, round, reactive to light.  No scleral icterus.  MOUTH: Nose/mouth/throat not examined due to masking requirements for COVID 19. NECK: Supple. No thyromegaly. Trachea midline. No JVD.  No adenopathy. PULMONARY: Good air entry bilaterally.  Coarse with no other adventitious sounds. CARDIOVASCULAR: S1 and S2. Regular rate and rhythm.  No rubs, murmurs or gallops heard. ABDOMEN: Benign. MUSCULOSKELETAL: No joint deformity, no clubbing, no edema.  NEUROLOGIC: No focal deficit, no gait disturbance, speech is fluent. SKIN: Intact,warm,dry. PSYCH: Mood and behavior normal  Ambulatory oximetry was performed today: Patient maintained oxygen saturations at 100 to 97%.  She ambulated 500 feet mostly stopped due to back pain.  Representative image of CT chest with contrast performed 18 November 2020, reviewed independently and with the patient, enlarged paratracheal adenopathy as noted:   Representative image of PET/CT performed 13 December 2020 showing intense FDG activity on the right paratracheal adenopathy:      Assessment & Plan:     ICD-10-CM   1. Paratracheal lymphadenopathy  R59.0    FDG avid Malignancy until proven otherwise Will need diagnosis by  EBUS/TBNA Hold Plavix 5 days prior to procedure    2. Stage 2 moderate COPD by GOLD classification ([place])  J44.9    Continue Trelegy Continue as needed albuterol Appears well compensated Recommend smoking cessation    3. Malignant neoplasm of overlapping sites of cervix [facility])  C53.8    This issue adds complexity to her management Being managed by oncology    4. Tobacco dependence due to cigarettes  F17.210    Patient counseled regards to discontinuation of smoking     Benefits, limitations and potential complications of the procedure were discussed with the patient/family.  Complications from bronchoscopy are rare and most often minor, but if they occur they may include breathing difficulty, vocal cord spasm, hoarseness, slight fever, vomiting, dizziness, bronchospasm, infection, low blood oxygen, bleeding  from biopsy site, or an allergic reaction to medications.  It is uncommon for patients to experience other more serious complications for example: Collapsed lung requiring chest tube placement, respiratory failure, heart attack and/or cardiac arrhythmia.  Patient is aware that the procedure will be done under general anesthesia.  Patient agrees to proceed.   The patient will need endobronchial ultrasound with TBNA for diagnosis of right paratracheal adenopathy that is malignancy until proven otherwise.  She has prior history of SBRT to the right upper lobe and a prior history of cervical cancer.  We will proceed with EBUS with TBNA on 14 December at 1245 hrs.  The patient is aware that she will need to hold Plavix for 5 days prior to the procedure.  We will see her in follow-up in 4 to 6 weeks time but will communicate with her via phone with results of the biopsy.  Renold Don, MD Advanced Bronchoscopy PCCM [facility]    *This note was dictated using voice recognition software/Dragon.  Despite best efforts to proofread, errors can occur which can change the  meaning.  Any change was purely unintentional.

## 2021-01-05 NOTE — Telephone Encounter (Signed)
Covid test 01/10/2021 between 8-12 and phone pre admit visit 01/07/2021 between 1-5.   Patient is aware of above dates/times and voiced her understanding.

## 2021-01-06 ENCOUNTER — Encounter: Payer: Self-pay | Admitting: *Deleted

## 2021-01-06 NOTE — Progress Notes (Signed)
Spoke with patient regarding upcoming appts for follow up with Dr. Baruch Gouty and Dr. Tasia Catchings after bronchoscopy. All questions answered during phone call. Instructed pt to call back with any further questions or needs. Pt verbalized understanding.

## 2021-01-06 NOTE — Telephone Encounter (Signed)
Patient is aware of below message and voiced her understanding.  Nothing further needed at this time.   

## 2021-01-07 ENCOUNTER — Other Ambulatory Visit: Payer: Self-pay

## 2021-01-07 ENCOUNTER — Encounter
Admission: RE | Admit: 2021-01-07 | Discharge: 2021-01-07 | Disposition: A | Discharge status: Home / Self Care | Payer: Medicare HMO | Source: Ambulatory Visit | Attending: Pulmonary Disease | Admitting: Pulmonary Disease

## 2021-01-07 DIAGNOSIS — J439 Emphysema, unspecified: Secondary | ICD-10-CM

## 2021-01-07 DIAGNOSIS — I10 Essential (primary) hypertension: Secondary | ICD-10-CM

## 2021-01-07 DIAGNOSIS — I6529 Occlusion and stenosis of unspecified carotid artery: Secondary | ICD-10-CM

## 2021-01-07 DIAGNOSIS — I700 Atherosclerosis of aorta: Secondary | ICD-10-CM

## 2021-01-07 HISTORY — DX: Atherosclerotic heart disease of native coronary artery without angina pectoris: I25.10

## 2021-01-07 HISTORY — DX: Cardiac arrhythmia, unspecified: I49.9

## 2021-01-07 HISTORY — DX: Gastro-esophageal reflux disease without esophagitis: K21.9

## 2021-01-07 HISTORY — DX: Unspecified osteoarthritis, unspecified site: M19.90

## 2021-01-07 HISTORY — DX: Malignant neoplasm of cervix uteri, unspecified: C53.9

## 2021-01-07 HISTORY — DX: Peripheral vascular disease, unspecified: I73.9

## 2021-01-07 HISTORY — DX: Family history of other specified conditions: Z84.89

## 2021-01-07 NOTE — Patient Instructions (Addendum)
Your procedure is scheduled on:01-12-21 Wednesday Report to the Registration Desk on the 1st floor of the [facility].Then proceed to the 2nd floor Surgery Desk in the [facility] To find out your arrival time, please call [phone] between 1PM - 3PM on:01-11-21 Tuesday  REMEMBER: Instructions that are not followed completely may result in serious medical risk, up to and including death; or upon the discretion of your surgeon and anesthesiologist your surgery may need to be rescheduled.  Do not eat food after midnight the night before surgery.  No gum chewing, lozengers or hard candies.  You may however, drink CLEAR liquids up to 2 hours before you are scheduled to arrive for your surgery. Do not drink anything within 2 hours of your scheduled arrival time.  Clear liquids include: - water  - apple juice without pulp - gatorade (not RED, PURPLE, OR BLUE) - black coffee or tea (Do NOT add milk or creamers to the coffee or tea) Do NOT drink anything that is not on this list  TAKE THESE MEDICATIONS THE MORNING OF SURGERY WITH A SIP OF WATER: -gabapentin (NEURONTIN) 100 MG capsule -omeprazole (PRILOSEC) 20 MG capsule-take one the night before and one on the morning of surgery - helps to prevent nausea after surgery.)  Use your Fluticasone-Umeclidin-Vilant (TRELEGY ELLIPTA) 100-62.5-25 MCG/INH AEPB the day of surgery  Stop your clopidogrel (PLAVIX) 75 MG tablet 5 days prior to surgery-Last dose on 01-06-21 Thursday  One week prior to surgery: Stop Anti-inflammatories (NSAIDS) NOW (01-07-21) such as Advil, Aleve, Ibuprofen, Motrin, Naproxen, Naprosyn and Aspirin based products such as Excedrin, Goodys Powder, BC Powder.You may however, continue to take Tylenol if needed for pain up until the day of surgery.  Stop ANY OVER THE COUNTER supplements/vitamins NOW (01-07-21) until after surgery (Multiple Vitamin (MULTIVITAMIN) tablet and vitamin B-12 (CYANOCOBALAMIN) 500 MCG tablet and ferrous  sulfate 325 (65 FE) MG EC tablet)  No Alcohol for 24 hours before or after surgery.  No Smoking including e-cigarettes for 24 hours prior to surgery.  No chewable tobacco products for at least 6 hours prior to surgery.  No nicotine patches on the day of surgery.  Do not use any "recreational" drugs for at least a week prior to your surgery.  Please be advised that the combination of cocaine and anesthesia may have negative outcomes, up to and including death. If you test positive for cocaine, your surgery will be cancelled.  On the morning of surgery brush your teeth with toothpaste and water, you may rinse your mouth with mouthwash if you wish. Do not swallow any toothpaste or mouthwash.  Do not wear jewelry, make-up, hairpins, clips or nail polish.  Do not wear lotions, powders, or perfumes.   Do not shave body from the neck down 48 hours prior to surgery just in case you cut yourself which could leave a site for infection.  Also, freshly shaved skin may become irritated if using the CHG soap.  Contact lenses, hearing aids and dentures may not be worn into surgery.  Do not bring valuables to the hospital. [facility] is not responsible for any missing/lost belongings or valuables.   Notify your doctor if there is any change in your medical condition (cold, fever, infection).  Wear comfortable clothing (specific to your surgery type) to the hospital.  After surgery, you can help prevent lung complications by doing breathing exercises.  Take deep breaths and cough every 1-2 hours. Your doctor may order a device called an Chiropodist  to help you take deep breaths. When coughing or sneezing, hold a pillow firmly against your incision with both hands. This is called "splinting." Doing this helps protect your incision. It also decreases belly discomfort.  If you are being admitted to the hospital overnight, leave your suitcase in the car. After surgery it may be brought to  your room.  If you are being discharged the day of surgery, you will not be allowed to drive home. You will need a responsible adult (18 years or older) to drive you home and stay with you that night.   If you are taking public transportation, you will need to have a responsible adult (18 years or older) with you. Please confirm with your physician that it is acceptable to use public transportation.   Please call the [facility]. at [phone] if you have any questions about these instructions.  Surgery Visitation Policy:  Patients undergoing a surgery or procedure may have one family member or support person with them as long as that person is not COVID-19 positive or experiencing its symptoms.  That person may remain in the waiting area during the procedure and may rotate out with other people.  Inpatient Visitation:    Visiting hours are 7 a.m. to 8 p.m. Up to two visitors ages 16+ are allowed at one time in a patient room. The visitors may rotate out with other people during the day. Visitors must check out when they leave, or other visitors will not be allowed. One designated support person may remain overnight. The visitor must pass COVID-19 screenings, use hand sanitizer when entering and exiting the patient's room and wear a mask at all times, including in the patient's room. Patients must also wear a mask when staff or their visitor are in the room. Masking is required regardless of vaccination status.

## 2021-01-10 ENCOUNTER — Other Ambulatory Visit: Admission: RE | Admit: 2021-01-10 | Payer: Medicare HMO | Source: Ambulatory Visit

## 2021-01-10 ENCOUNTER — Encounter
Admission: RE | Admit: 2021-01-10 | Discharge: 2021-01-10 | Disposition: A | Discharge status: Home / Self Care | Payer: Medicare HMO | Source: Ambulatory Visit | Attending: Pulmonary Disease | Admitting: Pulmonary Disease

## 2021-01-10 ENCOUNTER — Other Ambulatory Visit: Payer: Self-pay

## 2021-01-10 ENCOUNTER — Encounter: Payer: Self-pay | Admitting: Urgent Care

## 2021-01-10 DIAGNOSIS — I10 Essential (primary) hypertension: Secondary | ICD-10-CM

## 2021-01-10 DIAGNOSIS — I6529 Occlusion and stenosis of unspecified carotid artery: Secondary | ICD-10-CM | POA: Diagnosis not present

## 2021-01-10 DIAGNOSIS — Z1152 Encounter for screening for COVID-19: Secondary | ICD-10-CM

## 2021-01-10 DIAGNOSIS — I700 Atherosclerosis of aorta: Secondary | ICD-10-CM

## 2021-01-10 DIAGNOSIS — J439 Emphysema, unspecified: Secondary | ICD-10-CM

## 2021-01-10 DIAGNOSIS — Z01818 Encounter for other preprocedural examination: Secondary | ICD-10-CM | POA: Insufficient documentation

## 2021-01-10 DIAGNOSIS — Z20822 Contact with and (suspected) exposure to covid-19: Secondary | ICD-10-CM | POA: Diagnosis not present

## 2021-01-10 LAB — CBC
HCT: 32.7 % — ABNORMAL LOW (ref 36.0–46.0)
Hemoglobin: 11 g/dL — ABNORMAL LOW (ref 12.0–15.0)
MCH: 32.5 pg (ref 26.0–34.0)
MCHC: 33.6 g/dL (ref 30.0–36.0)
MCV: 96.7 fL (ref 80.0–100.0)
Platelets: 303 10*3/uL (ref 150–400)
RBC: 3.38 MIL/uL — ABNORMAL LOW (ref 3.87–5.11)
RDW: 12.8 % (ref 11.5–15.5)
WBC: 6.1 10*3/uL (ref 4.0–10.5)
nRBC: 0 % (ref 0.0–0.2)

## 2021-01-11 LAB — SARS CORONAVIRUS 2 (TAT 6-24 HRS): SARS Coronavirus 2: NEGATIVE

## 2021-01-12 ENCOUNTER — Other Ambulatory Visit: Payer: Self-pay

## 2021-01-12 ENCOUNTER — Encounter: Payer: Self-pay | Admitting: Pulmonary Disease

## 2021-01-12 ENCOUNTER — Ambulatory Visit: Payer: Medicare HMO | Admitting: Anesthesiology

## 2021-01-12 ENCOUNTER — Ambulatory Visit
Admission: RE | Admit: 2021-01-12 | Discharge: 2021-01-12 | Disposition: A | Discharge status: Home / Self Care | Payer: Medicare HMO | Attending: Pulmonary Disease | Admitting: Pulmonary Disease

## 2021-01-12 ENCOUNTER — Encounter
Admission: RE | Disposition: A | Discharge status: Home / Self Care | Payer: Self-pay | Source: Home / Self Care | Attending: Pulmonary Disease

## 2021-01-12 ENCOUNTER — Other Ambulatory Visit: Payer: Self-pay | Admitting: Family Medicine

## 2021-01-12 DIAGNOSIS — F17210 Nicotine dependence, cigarettes, uncomplicated: Secondary | ICD-10-CM | POA: Insufficient documentation

## 2021-01-12 DIAGNOSIS — Z9221 Personal history of antineoplastic chemotherapy: Secondary | ICD-10-CM | POA: Diagnosis not present

## 2021-01-12 DIAGNOSIS — Z7951 Long term (current) use of inhaled steroids: Secondary | ICD-10-CM | POA: Diagnosis not present

## 2021-01-12 DIAGNOSIS — R590 Localized enlarged lymph nodes: Secondary | ICD-10-CM | POA: Diagnosis not present

## 2021-01-12 DIAGNOSIS — Z9889 Other specified postprocedural states: Secondary | ICD-10-CM

## 2021-01-12 DIAGNOSIS — E039 Hypothyroidism, unspecified: Secondary | ICD-10-CM | POA: Insufficient documentation

## 2021-01-12 DIAGNOSIS — J449 Chronic obstructive pulmonary disease, unspecified: Secondary | ICD-10-CM | POA: Insufficient documentation

## 2021-01-12 DIAGNOSIS — C771 Secondary and unspecified malignant neoplasm of intrathoracic lymph nodes: Secondary | ICD-10-CM | POA: Insufficient documentation

## 2021-01-12 DIAGNOSIS — K219 Gastro-esophageal reflux disease without esophagitis: Secondary | ICD-10-CM | POA: Diagnosis not present

## 2021-01-12 DIAGNOSIS — Z85038 Personal history of other malignant neoplasm of large intestine: Secondary | ICD-10-CM | POA: Diagnosis not present

## 2021-01-12 DIAGNOSIS — R911 Solitary pulmonary nodule: Secondary | ICD-10-CM | POA: Diagnosis not present

## 2021-01-12 DIAGNOSIS — I739 Peripheral vascular disease, unspecified: Secondary | ICD-10-CM | POA: Insufficient documentation

## 2021-01-12 DIAGNOSIS — M1990 Unspecified osteoarthritis, unspecified site: Secondary | ICD-10-CM | POA: Insufficient documentation

## 2021-01-12 DIAGNOSIS — I10 Essential (primary) hypertension: Secondary | ICD-10-CM | POA: Insufficient documentation

## 2021-01-12 DIAGNOSIS — I4519 Other right bundle-branch block: Secondary | ICD-10-CM | POA: Insufficient documentation

## 2021-01-12 DIAGNOSIS — I2510 Atherosclerotic heart disease of native coronary artery without angina pectoris: Secondary | ICD-10-CM | POA: Diagnosis not present

## 2021-01-12 DIAGNOSIS — Z923 Personal history of irradiation: Secondary | ICD-10-CM | POA: Insufficient documentation

## 2021-01-12 DIAGNOSIS — Z7902 Long term (current) use of antithrombotics/antiplatelets: Secondary | ICD-10-CM | POA: Diagnosis not present

## 2021-01-12 DIAGNOSIS — Z8541 Personal history of malignant neoplasm of cervix uteri: Secondary | ICD-10-CM | POA: Insufficient documentation

## 2021-01-12 HISTORY — PX: VIDEO BRONCHOSCOPY WITH ENDOBRONCHIAL ULTRASOUND: SHX6177

## 2021-01-12 HISTORY — DX: Other specified health status: Z78.9

## 2021-01-12 SURGERY — BRONCHOSCOPY, WITH EBUS
Anesthesia: General

## 2021-01-12 MED ORDER — LACTATED RINGERS IV SOLN: INTRAVENOUS | Status: DC

## 2021-01-12 MED ORDER — ONDANSETRON HCL 4 MG/2ML IJ SOLN
INTRAMUSCULAR | Status: AC
  Filled 2021-01-12: qty 2

## 2021-01-12 MED ORDER — ONDANSETRON HCL 4 MG/2ML IJ SOLN
INTRAMUSCULAR | Status: DC | PRN
  Administered 2021-01-12: 4 mg via INTRAVENOUS

## 2021-01-12 MED ORDER — EPHEDRINE SULFATE 50 MG/ML IJ SOLN
INTRAMUSCULAR | Status: DC | PRN
  Administered 2021-01-12: 10 mg via INTRAVENOUS

## 2021-01-12 MED ORDER — ORAL CARE MOUTH RINSE: 15.0000 mL | Freq: Once | OROMUCOSAL | Status: AC

## 2021-01-12 MED ORDER — IPRATROPIUM-ALBUTEROL 0.5-2.5 (3) MG/3ML IN SOLN: 3.0000 mL | Freq: Once | RESPIRATORY_TRACT | Status: AC

## 2021-01-12 MED ORDER — FENTANYL CITRATE (PF) 100 MCG/2ML IJ SOLN
INTRAMUSCULAR | Status: DC | PRN
  Administered 2021-01-12 (×2): 50 ug via INTRAVENOUS

## 2021-01-12 MED ORDER — IPRATROPIUM-ALBUTEROL 0.5-2.5 (3) MG/3ML IN SOLN
RESPIRATORY_TRACT | Status: AC
  Administered 2021-01-12: 12:00:00 3 mL via RESPIRATORY_TRACT
  Filled 2021-01-12: qty 3

## 2021-01-12 MED ORDER — CHLORHEXIDINE GLUCONATE 0.12 % MT SOLN
OROMUCOSAL | Status: AC
  Administered 2021-01-12: 12:00:00 15 mL via OROMUCOSAL
  Filled 2021-01-12: qty 15

## 2021-01-12 MED ORDER — CHLORHEXIDINE GLUCONATE 0.12 % MT SOLN: 15.0000 mL | Freq: Once | OROMUCOSAL | Status: AC

## 2021-01-12 MED ORDER — EPHEDRINE 5 MG/ML INJ
INTRAVENOUS | Status: AC
  Filled 2021-01-12: qty 5

## 2021-01-12 MED ORDER — ROCURONIUM BROMIDE 100 MG/10ML IV SOLN
INTRAVENOUS | Status: DC | PRN
  Administered 2021-01-12: 40 mg via INTRAVENOUS

## 2021-01-12 MED ORDER — DEXAMETHASONE SODIUM PHOSPHATE 10 MG/ML IJ SOLN
INTRAMUSCULAR | Status: AC
  Filled 2021-01-12: qty 1

## 2021-01-12 MED ORDER — DEXAMETHASONE SODIUM PHOSPHATE 10 MG/ML IJ SOLN
INTRAMUSCULAR | Status: DC | PRN
  Administered 2021-01-12: 8 mg via INTRAVENOUS

## 2021-01-12 MED ORDER — FENTANYL CITRATE (PF) 100 MCG/2ML IJ SOLN
INTRAMUSCULAR | Status: AC
  Filled 2021-01-12: qty 2

## 2021-01-12 MED ORDER — LIDOCAINE HCL (PF) 2 % IJ SOLN
INTRAMUSCULAR | Status: AC
  Filled 2021-01-12: qty 5

## 2021-01-12 MED ORDER — PROPOFOL 10 MG/ML IV BOLUS
INTRAVENOUS | Status: DC | PRN
  Administered 2021-01-12: 20 mg via INTRAVENOUS
  Administered 2021-01-12: 100 mg via INTRAVENOUS

## 2021-01-12 MED ORDER — GLYCOPYRROLATE 0.2 MG/ML IJ SOLN
INTRAMUSCULAR | Status: AC
  Filled 2021-01-12: qty 1

## 2021-01-12 MED ORDER — SUGAMMADEX SODIUM 200 MG/2ML IV SOLN
INTRAVENOUS | Status: DC | PRN
  Administered 2021-01-12: 200 mg via INTRAVENOUS

## 2021-01-12 MED ORDER — LIDOCAINE HCL (CARDIAC) PF 100 MG/5ML IV SOSY
PREFILLED_SYRINGE | INTRAVENOUS | Status: DC | PRN
  Administered 2021-01-12: 50 mg via INTRAVENOUS

## 2021-01-12 MED ORDER — SODIUM CHLORIDE 0.9 % IV SOLN: Freq: Once | INTRAVENOUS | Status: DC

## 2021-01-12 MED ORDER — ROCURONIUM BROMIDE 10 MG/ML (PF) SYRINGE
PREFILLED_SYRINGE | INTRAVENOUS | Status: AC
  Filled 2021-01-12: qty 10

## 2021-01-12 MED ORDER — PHENYLEPHRINE 40 MCG/ML (10ML) SYRINGE FOR IV PUSH (FOR BLOOD PRESSURE SUPPORT)
PREFILLED_SYRINGE | INTRAVENOUS | Status: DC | PRN
  Administered 2021-01-12: 50 ug via INTRAVENOUS

## 2021-01-12 NOTE — Anesthesia Procedure Notes (Signed)
Procedure Name: Intubation Date/Time: 01/12/2021 1:00 PM Performed by: Lerry Liner, CRNA Pre-anesthesia Checklist: Patient identified, Emergency Drugs available, Suction available and Patient being monitored Patient Re-evaluated:Patient Re-evaluated prior to induction Oxygen Delivery Method: Circle system utilized Preoxygenation: Pre-oxygenation with 100% oxygen Induction Type: IV induction Ventilation: Mask ventilation without difficulty Laryngoscope Size: McGraph and 3 Grade View: Grade I Tube type: Oral Tube size: 8.5 mm Number of attempts: 1 Airway Equipment and Method: Stylet and Oral airway Placement Confirmation: ETT inserted through vocal cords under direct vision, positive ETCO2 and breath sounds checked- equal and bilateral Secured at: 22 cm Tube secured with: Tape Dental Injury: Teeth and Oropharynx as per pre-operative assessment

## 2021-01-12 NOTE — Anesthesia Postprocedure Evaluation (Signed)
Anesthesia Post Note  Patient: Mary Hoover  Procedure(s) Performed: VIDEO BRONCHOSCOPY WITH ENDOBRONCHIAL ULTRASOUND  Patient location during evaluation: PACU Anesthesia Type: General Level of consciousness: awake and alert Pain management: pain level controlled Vital Signs Assessment: post-procedure vital signs reviewed and stable Respiratory status: spontaneous breathing, nonlabored ventilation and respiratory function stable Cardiovascular status: blood pressure returned to baseline and stable Postop Assessment: no apparent nausea or vomiting Anesthetic complications: no   No notable events documented.   Last Vitals:  Vitals:   01/12/21 1400 01/12/21 1408  BP: (!) 136/56 131/75  Pulse: 81 80  Resp: 16 20  Temp:  (!) 36.4 C  SpO2: 100% 99%    Last Pain:  Vitals:   01/12/21 1408  TempSrc:   PainSc: 0-No pain                 Iran Ouch

## 2021-01-12 NOTE — Transfer of Care (Signed)
Immediate Anesthesia Transfer of Care Note  Patient: Mary Hoover  Procedure(s) Performed: VIDEO BRONCHOSCOPY WITH ENDOBRONCHIAL ULTRASOUND  Patient Location: PACU  Anesthesia Type:General  Level of Consciousness: awake and alert   Airway & Oxygen Therapy: Patient Spontanous Breathing and Patient connected to face mask oxygen  Post-op Assessment: Report given to RN and Post -op Vital signs reviewed and stable  Post vital signs: Reviewed and stable  Last Vitals:  Vitals Value Taken Time  BP 148/60 01/12/21 1346  Temp 36.2 C 01/12/21 1346  Pulse 84 01/12/21 1352  Resp 14 01/12/21 1352  SpO2 100 % 01/12/21 1352  Vitals shown include unvalidated device data.  Last Pain:  Vitals:   01/12/21 1346  TempSrc:   PainSc: 0-No pain         Complications: No notable events documented.

## 2021-01-12 NOTE — Interval H&P Note (Signed)
History and Physical Interval Note:  01/12/2021 11:06 AM  Mary Hoover  has presented today for surgery, with the diagnosis of LUNG NODULE.  The various methods of treatment have been discussed with the patient and family. After consideration of risks, benefits and other options for treatment, the patient has consented to  Procedure(s): [place] (N/A) as a surgical intervention.  The patient's history has been reviewed, patient examined, no change in status, stable for surgery.  I have reviewed the patient's chart and labs.  Questions were answered to the patient's satisfaction.     Vernard Gambles

## 2021-01-12 NOTE — Discharge Instructions (Addendum)
You may resume Plavix tomorrow 12/15.   AMBULATORY SURGERY  DISCHARGE INSTRUCTIONS   The drugs that you were given will stay in your system until tomorrow so for the next 24 hours you should not:  Drive an automobile Make any legal decisions Drink any alcoholic beverage   You may resume regular meals tomorrow.  Today it is better to start with liquids and gradually work up to solid foods.  You may eat anything you prefer, but it is better to start with liquids, then soup and crackers, and gradually work up to solid foods.   Please notify your doctor immediately if you have any unusual bleeding, trouble breathing, redness and pain at the surgery site, drainage, fever, or pain not relieved by medication.    Additional Instructions:        Please contact your physician with any problems or Same Day Surgery at [phone], Monday through Friday 6 am to 4 pm, or [place] at [facility] number at [phone].

## 2021-01-12 NOTE — Op Note (Signed)
PROCEDURES:  DIAGNOSTIC BRONCHOSCOPY ENDOBRONCHIAL ULTRASOUND WITH TBNA   PROCEDURE DATE: 01/12/2021  TIME:  NAME:  Mary Hoover  DOB:08/22/1945  MRN: [id number] LOC:  ARPO/None    HOSP DAY: N/A    Indications/Preliminary Diagnosis: RIGHT Paratracheal adenopathy  Consent: (Place X beside choice/s below)  The benefits, risks and possible complications of the procedure were        explained to:  _X__ patient  ___ patient's family  ___ other:___________  who verbalized understanding and gave:  ___ verbal  ___ written  _X__ verbal and written  ___ telephone  ___ other:________ consent.      Unable to obtain consent; procedure performed on emergent basis.     Other:     Benefits, limitations and potential complications of the procedure were discussed with the patient/family.  Complications from bronchoscopy are rare and most often minor, but if they occur they may include breathing difficulty, vocal cord spasm, hoarseness, slight fever, vomiting, dizziness, bronchospasm, infection, low blood oxygen, bleeding from biopsy site, or an allergic reaction to medications.  It is uncommon for patients to experience other more serious complications for example: Collapsed lung requiring chest tube placement, respiratory failure, heart attack and/or cardiac arrhythmia.  Patient understood and agreed to proceed.  Surgeon: Renold Don, MD Assistant/Scrub: Annia Belt, RRT Anesthesiologist/CRNA: Iran Ouch, MD/Rhonda Arlan Organ, CRNA, Johnna Acosta, CRNA Cytotechnology: Maryan Puls  ANESTHESIA TYPE: General endotracheal  PROCEDURE DETAILS:  The patient was taken to Procedure Room 2 (Bronchoscopy Suite) appropriate timeout was taken with the procedure room staff.  Patient was then inducted under general anesthesia by the anesthesia team.  He was intubated with an 8.5 ETT without difficulty.  A Portex adapter was placed on the ETT flange.  At this point the Olympus video  bronchoscope was advanced through the Portex adapter and anatomic tour of the airway was performed.   The visible trachea was normal, carina was sharp, there was an area of irregularity on the portal to the right mainstem bronchus but no distinct mass just some mucosal abnormality.  Inspection of the right lung showed no abnormalities or endobronchial lesions on the right upper lobe, right middle lobe and right lower lobe subsegments.  Inspection of the left lung showed no endobronchial lesions,on left upper lobe, lingula and lower lobe subsegments.  Patient had scant secretions noted only on the right and left mainstem these were lavaged and suctioned until cleared.  No mucous plugs nor inspissated secretions noted.  Once inspection was completed the airway was examined for hemostasis and the bronchoscope was retrieved and exchanged for an Olympus 90 endobronchial ultrasound (EBUS) scope.  At this point the mediastinum was examined the only abnormality was noted on the RIGHT paratracheal space corresponding to the patient's imaging studies, this measured 3 x 2 cm approximately.  Utilizing a Cook 22-gauge Pro Core EBUS needle transbronchial needle aspirates were performed x 5 through the right paratracheal (precarinal) space.  ROSE identified lesional cells.  Material was placed in CytoLyt as preservative.  Having completed this portion of the procedure there was excellent hemostasis.  The bronchoscope was then retrieved.  The patient then received 10 mL of 1% lidocaine via the ET tube.  The procedure was at this point terminated.  Patient was allowed to emerge from general anesthesia and was transferred to the PACU in satisfactory condition.  Patient did not have any respiratory distress nor pain postprocedure.  Tolerated the procedure well.   SPECIMENS (Sites): (Place X beside choice below)  Specimens Description   No Specimens Obtained     Washings    Lavage    Biopsies   X Fine Needle Aspirates X 5  Right Paratracheal (TBNA)   Brushings    Sputum    FINDINGS:  Mucosal irregularity at the threshold of the RIGHT mainstem bronchus but no definitive mass Enlarged paratracheal lymph node measuring approximately 3 x 2 cm (EBUS dimensions)  ESTIMATED BLOOD LOSS: Nil  COMPLICATIONS/RESOLUTION: No complications noted   IMPRESSION:POST-PROCEDURE DX: RIGHT paratracheal adenopathy, highly suspicious for cancer   RECOMMENDATION/PLAN:  Patient to keep follow-up appointments with oncology and pulmonary Await pathology report     C. Derrill Kay, MD Advanced Bronchoscopy PCCM [facility]-   *This note was dictated using voice recognition software/Dragon.  Despite best efforts to proofread, errors can occur which can change the meaning. Any transcriptional errors that result from this process are unintentional and may not be fully corrected at the time of dictation.

## 2021-01-12 NOTE — Telephone Encounter (Signed)
Requested medication (s) are due for refill today - yes  Requested medication (s) are on the active medication list -yes  Future visit scheduled -no  Last refill: 11/09/20 #30 1 RF  Notes to clinic: Request RF: non delegated Rx  Requested Prescriptions  Pending Prescriptions Disp Refills   cyclobenzaprine (FLEXERIL) 10 MG tablet [Pharmacy Med Name: CYCLOBENZAPRINE HYDROCHLORIDE 10 MG Tablet] 60 tablet     Sig: TAKE 1 TABLET AT BEDTIME     Not Delegated - Analgesics:  Muscle Relaxants Failed - 01/12/2021 10:26 AM      Failed - This refill cannot be delegated      Passed - Valid encounter within last 6 months    Recent Outpatient Visits           1 week ago Essential hypertension   [place], Megan P, DO   2 months ago Essential hypertension   [place], [place], DO   6 months ago [place], [place], DO   6 months ago [place], [place], DO   6 months ago Epigastric pain   [facility] [place], [place], DO                 Requested Prescriptions  Pending Prescriptions Disp Refills   cyclobenzaprine (FLEXERIL) 10 MG tablet [Pharmacy Med Name: CYCLOBENZAPRINE HYDROCHLORIDE 10 MG Tablet] 60 tablet     Sig: TAKE 1 TABLET AT BEDTIME     Not Delegated - Analgesics:  Muscle Relaxants Failed - 01/12/2021 10:26 AM      Failed - This refill cannot be delegated      Passed - Valid encounter within last 6 months    Recent Outpatient Visits           1 week ago Essential hypertension   [place], Megan P, DO   2 months ago Essential hypertension   [place], Megan P, DO   6 months ago [place], [place], DO   6 months ago [place] P, DO   6 months ago Epigastric pain   [place], [place], DO

## 2021-01-12 NOTE — Anesthesia Preprocedure Evaluation (Addendum)
Anesthesia Evaluation  Patient identified by MRN, date of birth, ID band Patient awake    Reviewed: Allergy & Precautions, NPO status , Patient's Chart, lab work & pertinent test results  History of Anesthesia Complications Negative for: history of anesthetic complications  Airway Mallampati: III       Dental  (+) Edentulous Upper, Edentulous Lower   Pulmonary shortness of breath and with exertion, COPD,  COPD inhaler, Current SmokerPatient did not abstain from smoking.,    Pulmonary exam normal        Cardiovascular Exercise Tolerance: Poor METS: 3 - Mets hypertension, Pt. on medications + CAD and + Peripheral Vascular Disease  (-) Past MI and (-) CHF + dysrhythmias (Incomplete right bundle branch block) (-) Valvular Problems/Murmurs Rhythm:Regular Rate:Normal  Carotid atherosclerosis s/p CEA 2015  EKG 01/10/2021: Normal sinus rhythm Rightward axis Incomplete right bundle branch block Borderline ECG When compared with   Neuro/Psych neg Seizures    GI/Hepatic Neg liver ROS, GERD  Medicated and Controlled,  Endo/Other  neg diabetesHypothyroidism   Renal/GU negative Renal ROS     Musculoskeletal  (+) Arthritis , Osteoarthritis,    Abdominal Normal abdominal exam  (+)   Peds  Hematology   Anesthesia Other Findings LUNG NODULE  Reproductive/Obstetrics                          Anesthesia Physical  Anesthesia Plan  ASA: III  Anesthesia Plan: General   Post-op Pain Management:    Induction: Intravenous  PONV Risk Score and Plan: 2 and Ondansetron and Dexamethasone  Airway Management Planned: Oral ETT  Additional Equipment:   Intra-op Plan:   Post-operative Plan: Extubation in OR  Informed Consent: I have reviewed the patients History and Physical, chart, labs and discussed the procedure including the risks, benefits and alternatives for the proposed anesthesia with the patient  or authorized representative who has indicated his/her understanding and acceptance.     Dental advisory given  Plan Discussed with: CRNA and Anesthesiologist  Anesthesia Plan Comments:        Anesthesia Quick Evaluation

## 2021-01-13 ENCOUNTER — Ambulatory Visit: Payer: Medicare HMO | Admitting: Radiation Oncology

## 2021-01-13 ENCOUNTER — Encounter: Payer: Self-pay | Admitting: Pulmonary Disease

## 2021-01-14 LAB — CYTOLOGY - NON PAP

## 2021-01-17 ENCOUNTER — Other Ambulatory Visit: Payer: Self-pay | Admitting: Pulmonary Disease

## 2021-01-20 ENCOUNTER — Inpatient Hospital Stay: Payer: Medicare HMO | Attending: Oncology

## 2021-01-20 ENCOUNTER — Other Ambulatory Visit: Payer: Self-pay

## 2021-01-20 ENCOUNTER — Encounter: Payer: Self-pay | Admitting: *Deleted

## 2021-01-20 ENCOUNTER — Other Ambulatory Visit: Payer: Medicare HMO

## 2021-01-20 ENCOUNTER — Ambulatory Visit
Admission: RE | Admit: 2021-01-20 | Discharge: 2021-01-20 | Disposition: A | Discharge status: Home / Self Care | Payer: Medicare HMO | Source: Ambulatory Visit | Attending: Family Medicine | Admitting: Family Medicine

## 2021-01-20 ENCOUNTER — Ambulatory Visit
Admission: RE | Admit: 2021-01-20 | Discharge: 2021-01-20 | Disposition: A | Discharge status: Home / Self Care | Payer: Medicare HMO | Source: Ambulatory Visit | Attending: Radiation Oncology | Admitting: Radiation Oncology

## 2021-01-20 ENCOUNTER — Encounter: Payer: Self-pay | Admitting: Radiation Oncology

## 2021-01-20 VITALS — BP 144/61 | HR 83 | Temp 97.6°F | Wt 132.6 lb

## 2021-01-20 DIAGNOSIS — I700 Atherosclerosis of aorta: Secondary | ICD-10-CM | POA: Insufficient documentation

## 2021-01-20 DIAGNOSIS — Z923 Personal history of irradiation: Secondary | ICD-10-CM | POA: Diagnosis not present

## 2021-01-20 DIAGNOSIS — M8448XA Pathological fracture, other site, initial encounter for fracture: Secondary | ICD-10-CM | POA: Diagnosis not present

## 2021-01-20 DIAGNOSIS — E039 Hypothyroidism, unspecified: Secondary | ICD-10-CM | POA: Insufficient documentation

## 2021-01-20 DIAGNOSIS — C3411 Malignant neoplasm of upper lobe, right bronchus or lung: Secondary | ICD-10-CM | POA: Insufficient documentation

## 2021-01-20 DIAGNOSIS — Z8541 Personal history of malignant neoplasm of cervix uteri: Secondary | ICD-10-CM | POA: Insufficient documentation

## 2021-01-20 DIAGNOSIS — E785 Hyperlipidemia, unspecified: Secondary | ICD-10-CM | POA: Insufficient documentation

## 2021-01-20 DIAGNOSIS — R911 Solitary pulmonary nodule: Secondary | ICD-10-CM

## 2021-01-20 DIAGNOSIS — I10 Essential (primary) hypertension: Secondary | ICD-10-CM | POA: Insufficient documentation

## 2021-01-20 DIAGNOSIS — Z9221 Personal history of antineoplastic chemotherapy: Secondary | ICD-10-CM | POA: Insufficient documentation

## 2021-01-20 DIAGNOSIS — F17210 Nicotine dependence, cigarettes, uncomplicated: Secondary | ICD-10-CM | POA: Insufficient documentation

## 2021-01-20 DIAGNOSIS — I2510 Atherosclerotic heart disease of native coronary artery without angina pectoris: Secondary | ICD-10-CM | POA: Insufficient documentation

## 2021-01-20 DIAGNOSIS — J432 Centrilobular emphysema: Secondary | ICD-10-CM | POA: Insufficient documentation

## 2021-01-20 DIAGNOSIS — Z79899 Other long term (current) drug therapy: Secondary | ICD-10-CM | POA: Insufficient documentation

## 2021-01-20 DIAGNOSIS — K219 Gastro-esophageal reflux disease without esophagitis: Secondary | ICD-10-CM | POA: Insufficient documentation

## 2021-01-20 DIAGNOSIS — Z803 Family history of malignant neoplasm of breast: Secondary | ICD-10-CM | POA: Insufficient documentation

## 2021-01-20 DIAGNOSIS — C770 Secondary and unspecified malignant neoplasm of lymph nodes of head, face and neck: Secondary | ICD-10-CM | POA: Insufficient documentation

## 2021-01-20 DIAGNOSIS — M545 Low back pain, unspecified: Secondary | ICD-10-CM | POA: Diagnosis not present

## 2021-01-20 DIAGNOSIS — C3491 Malignant neoplasm of unspecified part of right bronchus or lung: Secondary | ICD-10-CM | POA: Insufficient documentation

## 2021-01-20 DIAGNOSIS — R609 Edema, unspecified: Secondary | ICD-10-CM | POA: Insufficient documentation

## 2021-01-20 DIAGNOSIS — R921 Mammographic calcification found on diagnostic imaging of breast: Secondary | ICD-10-CM | POA: Insufficient documentation

## 2021-01-20 DIAGNOSIS — I739 Peripheral vascular disease, unspecified: Secondary | ICD-10-CM | POA: Insufficient documentation

## 2021-01-20 DIAGNOSIS — M48061 Spinal stenosis, lumbar region without neurogenic claudication: Secondary | ICD-10-CM | POA: Insufficient documentation

## 2021-01-20 DIAGNOSIS — C3490 Malignant neoplasm of unspecified part of unspecified bronchus or lung: Secondary | ICD-10-CM | POA: Diagnosis not present

## 2021-01-20 NOTE — Progress Notes (Signed)
Tumor Board Documentation  Mary Hoover was presented by Dr Tasia Catchings at our Tumor Board on 01/20/2021, which included representatives from medical oncology, radiation oncology, internal medicine, navigation, pathology, radiology, surgical, pharmacy, genetics, research, palliative care.  Mary Hoover currently presents as a current patient, for Mary Hoover, for new positive pathology with history of the following treatments: active survellience, neoadjuvant chemoradiation, surgical intervention(s).  Additionally, we reviewed previous medical and familial history, history of present illness, and recent lab results along with all available histopathologic and imaging studies. The tumor board considered available treatment options and made the following recommendations: Concurrent chemo-radiation therapy    The following procedures/referrals were also placed: No orders of the defined types were placed in this encounter.   Clinical Trial Status: not discussed   Staging used: AJCC Stage Group AJCC Staging:       Group: Stage III Non small cell lung cancer   National site-specific guidelines NCCN were discussed with respect to the case.  Tumor board is a meeting of clinicians from various specialty areas who evaluate and discuss patients for whom a multidisciplinary approach is being considered. Final determinations in the plan of care are those of the provider(s). The responsibility for follow up of recommendations given during tumor board is that of the provider.   Todays extended care, comprehensive team conference, Mary Hoover was not present for the discussion and was not examined.   Multidisciplinary Tumor Board is a multidisciplinary case peer review process.  Decisions discussed in the Multidisciplinary Tumor Board reflect the opinions of the specialists present at the conference without having examined the patient.  Ultimately, treatment and diagnostic decisions rest with the primary provider(s) and the  patient.

## 2021-01-20 NOTE — Progress Notes (Signed)
Met with patient during follow up visit with Dr. Baruch Gouty to review recent pathology results and discuss treatment options. All questions answered during visit. Reviewed upcoming appt with Dr. Tasia Catchings on 12/30 at 11:45am to discuss chemotherapy. Reviewed with patient rationale for drawing liquid biopsy sample today to check for specific markers that will help plan systemic treatment. Contact info given and instructed to call with any questions or needs. Pt verbalized understanding.

## 2021-01-20 NOTE — Progress Notes (Signed)
Radiation Oncology Follow up Note  Name: Mary Hoover   Date:   01/20/2021 MRN:  [id number] DOB: 27-Jan-1946    This 75 y.o. female presents to the clinic today for results of her recent bronchoscopy biopsy showing a new area of hypermetabolic activity in the right paratracheal region and patient previously treated with SBRT for a presumed stage I non-small cell lung cancer.  REFERRING PROVIDER: [name], DO  HPI: Patient is a 75 year old female.  Now out close to year having completed SBRT to right upper lobe for presumed stage I non-small cell lung cancer.  On repeat scans there was a newly enlarged pretracheal lymph node measuring 2.5 x 1.7 cm concerning for metastatic disease.  The area of right upper lobe SBRT showed minimal residual findings.  PET/CT confirmed hypermetabolic activity in the right lower paratracheal lymph node compatible with metastatic disease.  She underwent bronchoscopy and cytology was positive for non-small cell lung cancer favoring adenocarcinoma.  She is seen today for consideration of concurrent treatment.  She is doing well has a slight nonproductive cough no hemoptysis or chest tightness.  COMPLICATIONS OF TREATMENT: none  FOLLOW UP COMPLIANCE: keeps appointments   PHYSICAL EXAM:  BP (!) 144/61    Pulse 83    Temp 97.6 F (36.4 C) (Tympanic)    Wt 132 lb 9.6 oz (60.1 kg)    BMI 20.16 kg/m  Well-developed well-nourished patient in NAD. HEENT reveals PERLA, EOMI, discs not visualized.  Oral cavity is clear. No oral mucosal lesions are identified. Neck is clear without evidence of cervical or supraclavicular adenopathy. Lungs are clear to A&P. Cardiac examination is essentially unremarkable with regular rate and rhythm without murmur rub or thrill. Abdomen is benign with no organomegaly or masses noted. Motor sensory and DTR levels are equal and symmetric in the upper and lower extremities. Cranial nerves II through XII are grossly intact. Proprioception  is intact. No peripheral adenopathy or edema is identified. No motor or sensory levels are noted. Crude visual fields are within normal range.  RADIOLOGY RESULTS: CT and PET CT scan reviewed  PLAN: This time would recommend going ahead with concurrent chemoradiation therapy.  Would use IMRT treatment planning delivery based on his close approximation to the trachea spinal cord esophagus and previously radiated lung.  Would plan on delivering 70 Gray over 7 weeks.  Would use motion restriction for dimensional treatment planning.  Also use PET CT fusion for treatment planning purposes.  Also we will coordinate with medical oncology concurrent chemotherapy.  There will be extra effort by both professional staff as well as technical staff to coordinate and manage concurrent chemoradiation and ensuing side effects during her treatments.  Patient comprehends my recommendations well.  I would like to take this opportunity to thank you for allowing me to participate in the care of your patient.Noreene Filbert, MD

## 2021-01-21 ENCOUNTER — Other Ambulatory Visit: Payer: Self-pay | Admitting: Family Medicine

## 2021-01-21 DIAGNOSIS — M8448XA Pathological fracture, other site, initial encounter for fracture: Secondary | ICD-10-CM

## 2021-01-25 ENCOUNTER — Ambulatory Visit
Admission: RE | Admit: 2021-01-25 | Discharge: 2021-01-25 | Disposition: A | Discharge status: Home / Self Care | Payer: Medicare HMO | Source: Ambulatory Visit | Attending: Family Medicine | Admitting: Family Medicine

## 2021-01-25 ENCOUNTER — Other Ambulatory Visit: Payer: Self-pay

## 2021-01-25 DIAGNOSIS — R921 Mammographic calcification found on diagnostic imaging of breast: Secondary | ICD-10-CM | POA: Insufficient documentation

## 2021-01-25 DIAGNOSIS — J439 Emphysema, unspecified: Secondary | ICD-10-CM | POA: Diagnosis not present

## 2021-01-25 DIAGNOSIS — E039 Hypothyroidism, unspecified: Secondary | ICD-10-CM | POA: Diagnosis not present

## 2021-01-25 DIAGNOSIS — M810 Age-related osteoporosis without current pathological fracture: Secondary | ICD-10-CM | POA: Diagnosis not present

## 2021-01-25 DIAGNOSIS — Z1231 Encounter for screening mammogram for malignant neoplasm of breast: Secondary | ICD-10-CM

## 2021-01-25 DIAGNOSIS — Z780 Asymptomatic menopausal state: Secondary | ICD-10-CM | POA: Insufficient documentation

## 2021-01-25 DIAGNOSIS — Z8541 Personal history of malignant neoplasm of cervix uteri: Secondary | ICD-10-CM | POA: Diagnosis not present

## 2021-01-25 DIAGNOSIS — Z13820 Encounter for screening for osteoporosis: Secondary | ICD-10-CM | POA: Diagnosis not present

## 2021-01-25 DIAGNOSIS — M8448XA Pathological fracture, other site, initial encounter for fracture: Secondary | ICD-10-CM

## 2021-01-26 ENCOUNTER — Other Ambulatory Visit: Payer: Self-pay | Admitting: Family Medicine

## 2021-01-26 DIAGNOSIS — R928 Other abnormal and inconclusive findings on diagnostic imaging of breast: Secondary | ICD-10-CM

## 2021-01-26 DIAGNOSIS — R921 Mammographic calcification found on diagnostic imaging of breast: Secondary | ICD-10-CM

## 2021-01-28 ENCOUNTER — Other Ambulatory Visit: Payer: Self-pay

## 2021-01-28 ENCOUNTER — Inpatient Hospital Stay (HOSPITAL_BASED_OUTPATIENT_CLINIC_OR_DEPARTMENT_OTHER): Payer: Medicare HMO | Admitting: Oncology

## 2021-01-28 ENCOUNTER — Encounter: Payer: Self-pay | Admitting: Oncology

## 2021-01-28 VITALS — BP 133/70 | HR 94 | Temp 97.9°F | Resp 18 | Wt 134.4 lb

## 2021-01-28 DIAGNOSIS — M48061 Spinal stenosis, lumbar region without neurogenic claudication: Secondary | ICD-10-CM | POA: Diagnosis not present

## 2021-01-28 DIAGNOSIS — Z7189 Other specified counseling: Secondary | ICD-10-CM

## 2021-01-28 DIAGNOSIS — C538 Malignant neoplasm of overlapping sites of cervix uteri: Secondary | ICD-10-CM

## 2021-01-28 DIAGNOSIS — Z79899 Other long term (current) drug therapy: Secondary | ICD-10-CM | POA: Diagnosis not present

## 2021-01-28 DIAGNOSIS — I10 Essential (primary) hypertension: Secondary | ICD-10-CM | POA: Diagnosis not present

## 2021-01-28 DIAGNOSIS — C3491 Malignant neoplasm of unspecified part of right bronchus or lung: Secondary | ICD-10-CM | POA: Diagnosis not present

## 2021-01-28 DIAGNOSIS — Z923 Personal history of irradiation: Secondary | ICD-10-CM | POA: Diagnosis not present

## 2021-01-28 DIAGNOSIS — R609 Edema, unspecified: Secondary | ICD-10-CM | POA: Diagnosis not present

## 2021-01-28 DIAGNOSIS — Z803 Family history of malignant neoplasm of breast: Secondary | ICD-10-CM | POA: Diagnosis not present

## 2021-01-28 DIAGNOSIS — F17210 Nicotine dependence, cigarettes, uncomplicated: Secondary | ICD-10-CM | POA: Diagnosis not present

## 2021-01-28 DIAGNOSIS — K219 Gastro-esophageal reflux disease without esophagitis: Secondary | ICD-10-CM | POA: Diagnosis not present

## 2021-01-28 DIAGNOSIS — I2510 Atherosclerotic heart disease of native coronary artery without angina pectoris: Secondary | ICD-10-CM | POA: Diagnosis not present

## 2021-01-28 DIAGNOSIS — I700 Atherosclerosis of aorta: Secondary | ICD-10-CM | POA: Diagnosis not present

## 2021-01-28 DIAGNOSIS — I739 Peripheral vascular disease, unspecified: Secondary | ICD-10-CM | POA: Diagnosis not present

## 2021-01-28 DIAGNOSIS — Z9221 Personal history of antineoplastic chemotherapy: Secondary | ICD-10-CM | POA: Diagnosis not present

## 2021-01-28 DIAGNOSIS — J432 Centrilobular emphysema: Secondary | ICD-10-CM | POA: Diagnosis not present

## 2021-01-28 DIAGNOSIS — R921 Mammographic calcification found on diagnostic imaging of breast: Secondary | ICD-10-CM | POA: Diagnosis not present

## 2021-01-28 DIAGNOSIS — E785 Hyperlipidemia, unspecified: Secondary | ICD-10-CM | POA: Diagnosis not present

## 2021-01-28 DIAGNOSIS — E039 Hypothyroidism, unspecified: Secondary | ICD-10-CM | POA: Diagnosis not present

## 2021-01-28 DIAGNOSIS — Z8541 Personal history of malignant neoplasm of cervix uteri: Secondary | ICD-10-CM | POA: Diagnosis not present

## 2021-01-28 NOTE — Progress Notes (Signed)
1 

## 2021-01-29 ENCOUNTER — Encounter: Payer: Self-pay | Admitting: Oncology

## 2021-01-29 MED ORDER — MONTELUKAST SODIUM 10 MG PO TABS: 10.0000 mg | ORAL_TABLET | ORAL | 0 refills | Status: DC

## 2021-01-29 MED ORDER — DEXAMETHASONE 4 MG PO TABS: 8.0000 mg | ORAL_TABLET | Freq: Every day | ORAL | 1 refills | Status: DC

## 2021-01-29 MED ORDER — ONDANSETRON HCL 8 MG PO TABS: 8.0000 mg | ORAL_TABLET | Freq: Two times a day (BID) | ORAL | 1 refills | Status: DC | PRN

## 2021-01-29 NOTE — Progress Notes (Signed)
Hematology/Oncology note [phone] [phone]   Patient Care Team: [name], DO as PCP - General (Family Medicine) Clent Jacks, RN as Oncology Nurse Navigator Noreene Filbert, MD as Radiation Oncologist (Radiation Oncology) Telford Nab, RN as Oncology Nurse Navigator  REFERRING PROVIDER: [name], DO  CHIEF COMPLAINTS/REASON FOR VISIT:  Follow up cervical cancer  HISTORY OF PRESENTING ILLNESS:  Mary Hoover is a  75 y.o.  female with PMH listed below was seen in consultation at the request of  [name], DO  for evaluation of cervical cancer Patient has developed postmenopausal vaginal bleeding and discharge.  She was noted to have a friable cervix/2 cm mass of the cervix, concerning for malignancy 12/19/2018 endometrial biopsy showed scattered atypical squamous cells suspicious for malignancy.  Predominantly necrosis with associated inflammation. 01/01/2019 initial cervix biopsy showed at least high-grade squamous intraepithelial lesion.-HPV negative 01/22/2019, repeat vaginal wall biopsy and cervix 9:00 biopsy showed squamous cell carcinoma.    Staging images 12/31/2018 showed fluid distending the endometrial canal and or endometrial thickening to the level of cervix.  No evidence of pathological lymphadenopathy.  Haziness about the lower cervix.  7 mm irregular pulmonary nodule within the right upper lobe, suspicious for possible primary or metastatic malignancy.  Chronic pleural-parenchymal scarring/fibrosis at the lung apices.  Large amount of stool.  No bowel obstruction. 01/08/2019 PET scan showed marked hypermetabolism in the region of the cervix, compatible with the reported history of cervical cancer. Small bilateral pelvic sidewall lymph nodes discernible FDG accumulation, concerning for metastatic disease although neither lymph node is enlarged by CT size criteria. 7 mm nodule in the right upper lobe shows discernible FDG  accumulation.  Questionable neoplasm, primary versus metastatic. Emphysema.   #Chronic hearing loss # Lung nodule, FDG avid, questionable primary bronchogenic carcinoma versus metastatic cancer. Discussed with radiation oncology.  Her case was also discussed on tumor board.  Consensus reached on finishing concurrent chemoradiation treatments for cervix cancer first. Possible SBRT to lung nodule for presumed primary lung cancer.  # 02/20/2020 - 03/19/2020 concurrent chemotherapy weekly carboplatin and taxol and radiation for treatment of this cervix cancer.  #December 2021 lung nodule,was evaluated by Dr.Oaks. Options of biopsy via transthoracic or endobronchial approach vs surgical resection.  Patient opted to empiric SBRT  Patient was last seen by me on 06/25/2019.  Lost follow-up. She continues to follow with radiation oncology. 11/20/2020, CT chest with contrast showed minimal residual of previously seen right upper lobe nodule.  0.6 x 0.4 cm.  Internal development of mild bandlike radiation fibrosis.  Newly enlarged pretracheal lymph node, measuring 2.5 x 1.7 cm.  Highly concerning for metastatic disease.  Unchanged prominent AP window and the right hilar lymph nodes.  Emphysema and diffuse bilateral bronchial wall thickening.  Coronary artery disease. 12/13/2020, PET scan showed enlarging hypermetabolic right lower paratracheal lymph node, compatible with metastatic disease.  SUV 10.3.  Mild FDG uptake of the bilateral sacral alla with associated lucency.  Concerning for sacral insufficiency fracture.  Stable posttreatment changes of the right upper lobe nodule.  Nonspecific small solid 3 mm pulmonary nodule of the left lower lobe.  2 small to be characterized.  No evidence of FDG avid metastatic disease in the abdomen or pelvis.  Aortic atherosclerosis and emphysema.  Patient reestablish care on 12/27/2020.  INTERVAL HISTORY Mary Hoover is a 75 y.o. female who has above history reviewed by  me today presents for follow up visit for management of possible recurrent  lung cancer.  History of cervix cancer.    #01/12/2021 patient underwent bronchoscopy biopsy by Dr. Patsey Berthold Right paratracheal lymph node fine-needle aspiration is positive for metastatic non-small cell carcinoma, favor adenocarcinoma of lung origin.  Patient present to discuss about results and treatment plan.   Review of Systems  Constitutional:  Negative for appetite change, chills, fatigue and fever.  HENT:   Positive for hearing loss. Negative for voice change.   Eyes:  Negative for eye problems.  Respiratory:  Negative for chest tightness and cough.   Cardiovascular:  Negative for chest pain.  Gastrointestinal:  Negative for abdominal distention, abdominal pain and blood in stool.  Endocrine: Negative for hot flashes.  Genitourinary:  Negative for difficulty urinating and frequency.   Musculoskeletal:  Negative for arthralgias.  Skin:  Negative for itching and rash.  Neurological:  Negative for extremity weakness.  Hematological:  Negative for adenopathy.  Psychiatric/Behavioral:  Negative for confusion.    MEDICAL HISTORY:  Past Medical History:  Diagnosis Date   Arthritis    Cancer ([place])    Carotid atherosclerosis    Carotid bruit    Cervical cancer (HCC)    COPD (chronic obstructive pulmonary disease) (HCC)    emphysema   Coronary artery disease    Family history of adverse reaction to anesthesia    paternal grandfather died during surgery-pt unaware what happened   GERD (gastroesophageal reflux disease)    Hyperlipidemia    Hypertension    Hyponatremia 02/27/2019   Hypothyroidism    Infected cat bite 1980s   was hospitalized   Irregular heartbeat    Medical history non-contributory    Peripheral vascular disease ([place])    Tobacco use     SURGICAL HISTORY: Past Surgical History:  Procedure Laterality Date   CAROTID ENDARTERECTOMY Right Oct. 2015   Dr. Lucky Cowboy   CAROTID STENOSIS Left  April 2016   carotid stenosis surgery   COLONOSCOPY WITH PROPOFOL N/A 11/08/2016   Procedure: COLONOSCOPY WITH PROPOFOL;  Surgeon: Jonathon Bellows, MD;  Location: [facility] ENDOSCOPY;  Service: Gastroenterology;  Laterality: N/A;   ESOPHAGOGASTRODUODENOSCOPY (EGD) WITH PROPOFOL N/A 11/08/2016   Procedure: ESOPHAGOGASTRODUODENOSCOPY (EGD) WITH PROPOFOL;  Surgeon: Jonathon Bellows, MD;  Location: [facility] ENDOSCOPY;  Service: Gastroenterology;  Laterality: N/A;   FLEXIBLE BRONCHOSCOPY N/A 12/17/2014   Procedure: FLEXIBLE BRONCHOSCOPY;  Surgeon: Wilhelmina Mcardle, MD;  Location: [facility] ORS;  Service: Pulmonary;  Laterality: N/A;   INCISION AND DRAINAGE / EXCISION THYROGLOSSAL CYST  April 2011   VIDEO BRONCHOSCOPY WITH ENDOBRONCHIAL ULTRASOUND N/A 01/12/2021   Procedure: VIDEO BRONCHOSCOPY WITH ENDOBRONCHIAL ULTRASOUND;  Surgeon: Tyler Pita, MD;  Location: [facility] ORS;  Service: Cardiopulmonary;  Laterality: N/A;    SOCIAL HISTORY: Social History   Socioeconomic History   Marital status: Married    Spouse name: Not on file   Number of children: Not on file   Years of education: Not on file   Highest education level: 9th grade  Occupational History   Occupation: retired   Tobacco Use   Smoking status: Every Day    Packs/day: 1.75    Years: 50.00    Pack years: 87.50    Types: Cigarettes   Smokeless tobacco: Never   Tobacco comments:    0.5 PPD 01/05/2021  Vaping Use   Vaping Use: Former  Substance and Sexual Activity   Alcohol use: No    Alcohol/week: 0.0 standard drinks   Drug use: No   Sexual activity: Yes  Other Topics Concern   Not  on file  Social History Narrative   Lives at home with husband   Social Determinants of Health   Financial Resource Strain: Not on file  Food Insecurity: Not on file  Transportation Needs: Not on file  Physical Activity: Not on file  Stress: Not on file  Social Connections: Not on file  Intimate Partner Violence: Not on file    FAMILY HISTORY: Family  History  Problem Relation Age of Onset   Congestive Heart Failure Mother    Heart disease Mother    Hypertension Mother    Hypertension Sister    Diabetes Sister    Heart disease Sister    Heart disease Maternal Uncle    Heart disease Maternal Grandmother    Stroke Maternal Grandfather    Cancer Brother        liver, lung   Heart disease Brother    Hypertension Brother    Heart attack Brother    COPD Neg Hx    Breast cancer Neg Hx     ALLERGIES:  is allergic to atorvastatin.  MEDICATIONS:  Current Outpatient Medications  Medication Sig Dispense Refill   acetaminophen (TYLENOL) 500 MG tablet Take 500 mg by mouth every 6 (six) hours as needed for moderate pain.     clopidogrel (PLAVIX) 75 MG tablet Take 1 tablet (75 mg total) by mouth daily. 90 tablet 3   cyclobenzaprine (FLEXERIL) 10 MG tablet TAKE 1 TABLET AT BEDTIME 60 tablet 3   ferrous sulfate 325 (65 FE) MG EC tablet Take 1 tablet (325 mg total) by mouth daily with breakfast. (Patient not taking: Reported on 01/20/2021) 90 tablet 4   Fluticasone-Umeclidin-Vilant (TRELEGY ELLIPTA) 100-62.5-25 MCG/INH AEPB Inhale 1 puff into the lungs daily. (Patient not taking: Reported on 01/20/2021) 1 each 11   gabapentin (NEURONTIN) 100 MG capsule Take 1 capsule (100 mg total) by mouth 3 (three) times daily. 270 capsule 1   levothyroxine (SYNTHROID) 75 MCG tablet Take 1 tablet (75 mcg total) by mouth daily. (Patient not taking: Reported on 01/20/2021) 90 tablet 3   lisinopril (ZESTRIL) 20 MG tablet Take 1 tablet (20 mg total) by mouth daily. (Patient taking differently: Take 20 mg by mouth every evening.) 90 tablet 1   Multiple Vitamin (MULTIVITAMIN) tablet Take 1 tablet by mouth daily.     naproxen (NAPROSYN) 500 MG tablet TAKE 1 TABLET TWICE DAILY WITH MEALS 180 tablet 1   omeprazole (PRILOSEC) 20 MG capsule Take 1 capsule (20 mg total) by mouth daily. (Patient taking differently: Take 20 mg by mouth daily before lunch.) 90 capsule 1    vitamin B-12 (CYANOCOBALAMIN) 500 MCG tablet Take 500 mcg by mouth daily.     No current facility-administered medications for this visit.     PHYSICAL EXAMINATION: ECOG PERFORMANCE STATUS: 1 - Symptomatic but completely ambulatory Vitals:   01/28/21 1147  BP: 133/70  Pulse: 94  Resp: 18  Temp: 97.9 F (36.6 C)   Filed Weights   01/28/21 1147  Weight: 134 lb 6.4 oz (61 kg)    Physical Exam Constitutional:      General: She is not in acute distress. HENT:     Head: Normocephalic and atraumatic.  Eyes:     General: No scleral icterus.    Pupils: Pupils are equal, round, and reactive to light.  Cardiovascular:     Rate and Rhythm: Normal rate and regular rhythm.     Heart sounds: Normal heart sounds.  Pulmonary:     Effort: Pulmonary effort  is normal. No respiratory distress.     Breath sounds: No wheezing.     Comments: Decreased breath sound bilaterally  Abdominal:     General: Bowel sounds are normal. There is no distension.     Palpations: Abdomen is soft. There is no mass.     Tenderness: There is no abdominal tenderness.  Musculoskeletal:        General: No deformity. Normal range of motion.     Cervical back: Normal range of motion and neck supple.  Skin:    General: Skin is warm and dry.     Findings: No erythema or rash.  Neurological:     Mental Status: She is alert and oriented to person, place, and time. Mental status is at baseline.     Cranial Nerves: No cranial nerve deficit.     Coordination: Coordination normal.  Psychiatric:        Mood and Affect: Mood normal.    LABORATORY DATA:  I have reviewed the data as listed Lab Results  Component Value Date   WBC 6.1 01/10/2021   HGB 11.0 (L) 01/10/2021   HCT 32.7 (L) 01/10/2021   MCV 96.7 01/10/2021   PLT 303 01/10/2021   Recent Labs    05/20/20 1331 11/09/20 1442 01/04/21 1452  NA 130* 130* 132*  K 4.9 4.3 4.5  CL 94* 95* 96  CO2 21 19* 22  GLUCOSE 83 83 74  BUN _0 CREATININE  1.05* 1.01* 0.97  CALCIUM 9.2 9.1 9.3  PROT 7.0 6.3  --   ALBUMIN 4.3 4.2  --   AST 13 17  --   ALT 7 9  --   ALKPHOS 108 74  --   BILITOT 0.3 0.2  --     Iron/TIBC/Ferritin/ %Sat    Component Value Date/Time   IRON 55 10/31/2016 1546   TIBC 339 10/31/2016 1546   FERRITIN 18 10/31/2016 1546   IRONPCTSAT 16 10/31/2016 1546      RADIOGRAPHIC STUDIES: I have personally reviewed the radiological images as listed and agreed with the findings in the report. CT Chest W Contrast  Result Date: 11/20/2020 CLINICAL DATA:  Follow-up non-small cell lung cancer, status post radiation therapy to a presumed malignancy of the right upper lobe EXAM: CT CHEST WITH CONTRAST TECHNIQUE: Multidetector CT imaging of the chest was performed during intravenous contrast administration. CONTRAST:  26m OMNIPAQUE IOHEXOL 300 MG/ML  SOLN COMPARISON:  05/17/2020, PET-CT, 11/27/2019 FINDINGS: Cardiovascular: Aortic atherosclerosis. Normal heart size. Three-vessel coronary artery calcifications. No pericardial effusion. Mediastinum/Nodes: Newly enlarged pretracheal lymph node, measuring 2.5 x 1.7 cm (series 2, image 49). Unchanged prominent AP window node measuring up to 1.5 x 1.0 cm (series 2, image 55) and right hilar node measuring 1.4 x 1.0 cm (series 2, image 62). Thyroid gland, trachea, and esophagus demonstrate no significant findings. Lungs/Pleura: Moderate centrilobular emphysema. Diffuse bilateral bronchial wall thickening. There is a minimal residual of a previously seen right upper lobe nodule, measuring no greater than 0.6 x 0.4 cm, previously 0.8 x 0.6 cm (series 3, image 46). Interval development of mild, bandlike radiation fibrosis in this vicinity. No pleural effusion or pneumothorax. Upper Abdomen: No acute abnormality. Musculoskeletal: No chest wall mass or suspicious bone lesions identified. IMPRESSION: 1. There is a minimal residual of a previously seen right upper lobe nodule, measuring no greater than  0.6 x 0.4 cm, previously 0.8 x 0.6 cm. Interval development of mild, bandlike radiation fibrosis in this vicinity. Findings  are consistent with treatment response to radiation therapy. 2. Newly enlarged pretracheal lymph node, measuring 2.5 x 1.7 cm, highly concerning for metastatic disease. 3. Unchanged prominent AP window and right hilar lymph nodes, which remain more nonspecific, previously with equivocal FDG avidity by PET. 4. Emphysema and diffuse bilateral bronchial wall thickening. 5. Coronary artery disease. Aortic Atherosclerosis (ICD10-I70.0). Electronically Signed   By: Delanna Ahmadi M.D.   On: 11/20/2020 10:07   MR Lumbar Spine Wo Contrast  Result Date: 01/21/2021 CLINICAL DATA:  Low back pain, increased fracture risk ?sacral insuffiency fracture on PET scan EXAM: MRI LUMBAR SPINE WITHOUT CONTRAST TECHNIQUE: Multiplanar, multisequence MR imaging of the lumbar spine was performed. No intravenous contrast was administered. COMPARISON:  PET-CT 12/13/2020, x-ray 05/20/2020 FINDINGS: Segmentation: Transitional lumbosacral anatomy with partial sacralization of the L5 segment. Lowest rib-bearing segment is labeled as T12 (axial series 8, image 6). Alignment:  Trace retrolisthesis of L4 on L5. Vertebrae: Mild superior endplate compression fracture of L5 with approximately 15% vertebral body height loss. Mild bone marrow edema within the L5 vertebral body. There is extensive bone marrow edema within the bilateral sacral ala associated with bilateral sacral fractures, likely subacute given findings on previous PET-CT. Remaining vertebral bodies of the lumbar spine are intact with preserved height. No additional fracture. No evidence of discitis. No suspicious marrow replacing bone lesion. Conus medullaris and cauda equina: Conus extends to the L1 level. Conus and cauda equina appear normal. Paraspinal and other soft tissues: Negative. Disc levels: T12-L1: Minimal disc bulge. Mild bilateral facet arthropathy.  No foraminal or canal stenosis. L1-L2: Minimal disc bulge with small left foraminal protrusion. Mild bilateral facet hypertrophy. No foraminal or canal stenosis. L2-L3: Mild annular disc bulge. Mild bilateral facet arthropathy with ligamentum flavum buckling. Findings result in mild canal stenosis. No significant foraminal stenosis. L3-L4: Mild annular disc bulge. Mild bilateral facet arthropathy with ligamentum flavum buckling. Findings result in mild canal stenosis. No significant foraminal stenosis. L4-L5: Retrolisthesis with disc bulge and endplate ridging. Mild-moderate bilateral facet arthropathy with ligamentum flavum buckling. Mild bilateral subarticular recess stenosis without canal stenosis. No significant foraminal stenosis. L5-S1: Transitional level.  No evidence of impingement. IMPRESSION: 1. Transitional lumbosacral anatomy, as above. 2. Subacute-appearing mild superior endplate compression fracture of L5 with approximately 15% vertebral body height loss. 3. Extensive bone marrow edema within the bilateral sacral ala associated with bilateral sacral fractures, likely subacute given findings on previous PET-CT. 4. Mild multilevel degenerative changes of the lumbar spine as described. 5. Mild canal stenosis at L2-3 and L3-4. 6. Mild bilateral subarticular recess stenosis at L4-5. Electronically Signed   By: Davina Poke D.O.   On: 01/21/2021 09:10   NM PET Image Restag (PS) Skull Base To Thigh  Result Date: 12/14/2020 CLINICAL DATA:  Follow-up treatment strategy for non-small cell lung cancer. EXAM: NUCLEAR MEDICINE PET SKULL BASE TO THIGH TECHNIQUE: 7.0 mCi F-18 FDG was injected intravenously. Full-ring PET imaging was performed from the skull base to thigh after the radiotracer. CT data was obtained and used for attenuation correction and anatomic localization. Fasting blood glucose: 79 mg/dl COMPARISON:  Chest CT dated November 18, 2020 FINDINGS: Mediastinal blood pool activity: SUV max 2.1  Liver activity: SUV max 2.8 NECK: No hypermetabolic lymph nodes in the neck. Incidental CT findings: none CHEST: Enlarged and hypermetabolic right lower paratracheal lymph node measuring 1.4 cm in short axis on series 3, image 85 with an SUV max of 10.3, unchanged in size when compared with prior in remeasured in  similar plane. Unchanged prominent AP window and right hilar lymph nodes which demonstrate no hypermetabolic activity. No additional hypermetabolic lymph nodes seen in the chest. Right upper lobe ground-glass opacities with mild FDG uptake below mediastinal blood pool, likely post radiation change. Tiny solid right upper lobe pulmonary nodule measuring approximately 5 mm on image 81. Pulmonary nodule of the left lower lobe measuring 3 mm on series 3, image 117, unchanged compared to prior, but new when compared with more remote prior exams. Incidental CT findings: Centrilobular emphysema. Three-vessel coronary artery calcifications. Atherosclerotic disease of the thoracic aorta. ABDOMEN/PELVIS: No abnormal hypermetabolic activity within the liver, pancreas, adrenal glands, or spleen. No hypermetabolic lymph nodes in the abdomen or pelvis. Incidental CT findings: Atherosclerotic disease of the thoracic aorta. Calcifications of the liver, likely sequela prior granulomatous infection. SKELETON: Mild diffuse FDG uptake of the bilateral sacral ala with associated lucency, SUV max of 2.6. Otherwise no focal FDG uptake. Incidental CT findings: Unchanged small sclerotic lesions of the left iliac bone, likely bone islands. IMPRESSION: 1. Enlarged and hypermetabolic right lower paratracheal lymph node, compatible with metastatic disease. 2. Mild FDG uptake of the bilateral sacral ala with associated lucency. Concerning for sacral insufficiency fractures. This could be further evaluated with MRI of the pelvis. 3. Stable posttreatment changes of the right upper lobe. 4. Nonspecific small solid 3 mm pulmonary nodule of  the left lower lobe, too small to assess for FDG avidity. Nodules unchanged compared to most recent prior, but new when compared with more remote priors. Recommend attention on follow-up. 5. No evidence of FDG avid metastatic disease in the abdomen or pelvis. 6. Aortic Atherosclerosis (ICD10-I70.0) and Emphysema (ICD10-J43.9). These results will be called to the ordering clinician or representative by the Radiologist Assistant, and communication documented in the PACS or [employer]. Electronically Signed   By: Yetta Glassman M.D.   On: 12/14/2020 10:58   DG Bone Density  Result Date: 01/25/2021 EXAM: DUAL X-RAY ABSORPTIOMETRY (DXA) FOR BONE MINERAL DENSITY IMPRESSION: crr Your patient Joleigh Mineau completed a BMD test on 01/25/2021 using the [employer] (analysis version: 14.10) manufactured by [employer]. The following summarizes the results of our evaluation. PATIENT BIOGRAPHICAL: Name: Mary Hoover, Mary Hoover Patient ID: [id number] Birth Date: 08/09/1945 Height: 67.2 in. Gender: Female Exam Date: 01/25/2021 Weight: 132.2 lbs. Indications: Advanced Age, arthritis, Caucasian, copd, emphysema, Height Loss, History of Fracture (Adult), hx cervical ca, hypothyroid, Low Body Weight, lung ca, POSTmenopausal, Tobacco User Fractures: coccyx Treatments: GABAPENTIN, levothyroxine, multivitamin, plavix, trelegy ASSESSMENT: The BMD measured at Femur Total Right is 0.522 g/cm2 with a T-score of -3.9. This patient is considered osteoporotic according to [facility]) criteria. Lumbar spine was not utilized due to advanced degenerative changes.The scan quality is limited by exclusion of L-spine. Site Region Measured Measured WHO Young Adult BMD Date       Age      Classification T-score DualFemur Total Right 01/25/20[age] Osteoporosis -3.9 0.522 g/cm2 Left Forearm Radius 33% 01/25/2021 73.8 Osteoporosis -3.7 0.556 g/cm2 World Health Organization [facility]) criteria for  post-menopausal, Caucasian Women: Normal:       T-score at or above -1 SD Osteopenia:   T-score between -1 and -2.5 SD Osteoporosis: T-score at or below -2.5 SD RECOMMENDATIONS: 1. All patients should optimize calcium and vitamin D intake. 2. Consider FDA-approved medical therapies in postmenopausal women and men aged 62 years and older, based on the following: a. A hip or vertebral (clinical or morphometric) fracture b. T-score < -  2.5 at the femoral neck or spine after appropriate evaluation to exclude secondary causes c. Low bone mass (T-score between -1.0 and -2.5 at the femoral neck or spine) and a 10-year probability of a hip fracture > 3% or a 10-year probability of a major osteoporosis-related fracture > 20% based on the [place]-adapted WHO algorithm d. Clinician judgment and/or patient preferences may indicate treatment for people with 10-year fracture probabilities above or below these levels FOLLOW-UP: People with diagnosed cases of osteoporosis or at high risk for fracture should have regular bone mineral density tests. For patients eligible for Medicare, routine testing is allowed once every 2 years. The testing frequency can be increased to one year for patients who have rapidly progressing disease, those who are receiving or discontinuing medical therapy to restore bone mass, or have additional risk factors. I have reviewed this report, and agree with the above findings. [facility] Radiology Electronically Signed   By: Franki Cabot M.D.   On: 01/25/2021 15:11   MM 3D SCREEN BREAST BILATERAL  Result Date: 01/26/2021 CLINICAL DATA:  Screening. EXAM: DIGITAL SCREENING BILATERAL MAMMOGRAM WITH TOMOSYNTHESIS AND CAD TECHNIQUE: Bilateral screening digital craniocaudal and mediolateral oblique mammograms were obtained. Bilateral screening digital breast tomosynthesis was performed. The images were evaluated with computer-aided detection. COMPARISON:  None. ACR Breast Density Category c: The breast tissue is  heterogeneously dense, which may obscure small masses. FINDINGS: In the right breast, calcifications warrant further evaluation with magnified views. In the left breast, no findings suspicious for malignancy. IMPRESSION: Further evaluation is suggested for calcifications in the right breast. RECOMMENDATION: Diagnostic mammogram of the right breast. (Code:[id number]) The patient will be contacted regarding the findings, and additional imaging will be scheduled. BI-RADS CATEGORY  0: Incomplete. Need additional imaging evaluation and/or prior mammograms for comparison. Electronically Signed   By: Nolon Nations M.D.   On: 01/26/2021 07:35      ASSESSMENT & PLAN:  1. Primary adenocarcinoma of right lung ([place])   2. Malignant neoplasm of overlapping sites of cervix ([place])   3. Goals of care, counseling/discussion    Cancer Staging  Cervical cancer ([place]) Staging form: Cervix Uteri, AJCC Version 9 - Clinical: FIGO Stage IIICr (cTX, cN1) - Unsigned  Primary lung adenocarcinoma ([place]) Staging form: Lung, AJCC 7th Edition - Clinical stage from 01/12/2021: rT1, N1, M0 - Signed by [name], MD on 01/29/2021   #Stage III lung adenocarcinoma, recurrent  Image findings and pathology was reviewed and discussed with patient. Recommend MRI brain to complete staging Recommend concurrent chemotherapy radiation with weekly carboplatin/Taxol followed by immunotherapy maintenance. Rationale and potential side effects of chemotherapy were reviewed and discussed with patient. Antiemetics were sent to patient's pharmacy.  # #Locally advanced cervical cancer stage IIIC1 with pelvic nodes involvement. S/p concurrent chemotherapy and radiation followed by brachytherapy.  She is not actively following up with gynecology oncology.  I recommend patient to reestablish care.  All questions were answered. The patient knows to call the clinic with any problems questions or concerns.   Return of visit: to be determined.     [name], MD, PhD 01/29/2021

## 2021-01-29 NOTE — Progress Notes (Signed)
START ON PATHWAY REGIMEN - Non-Small Cell Lung     Administer weekly:     Paclitaxel      Carboplatin   **Always confirm dose/schedule in your pharmacy ordering system**  Patient Characteristics: Preoperative or Nonsurgical Candidate (Clinical Staging), Stage III - Nonsurgical Candidate (Nonsquamous and Squamous), PS = 0, 1 Therapeutic Status: Preoperative or Nonsurgical Candidate (Clinical Staging) AJCC T Category: cTX AJCC N Category: cNX AJCC M Category: cM0 AJCC 8 Stage Grouping: IIIA ECOG Performance Status: 1 Intent of Therapy: Curative Intent, Discussed with Patient

## 2021-01-30 ENCOUNTER — Encounter: Payer: Self-pay | Admitting: Oncology

## 2021-02-01 ENCOUNTER — Telehealth: Payer: Self-pay

## 2021-02-01 ENCOUNTER — Ambulatory Visit
Admission: RE | Admit: 2021-02-01 | Discharge: 2021-02-01 | Disposition: A | Discharge status: Home / Self Care | Payer: Medicare PPO | Source: Ambulatory Visit | Attending: Radiation Oncology | Admitting: Radiation Oncology

## 2021-02-01 ENCOUNTER — Ambulatory Visit
Admission: RE | Admit: 2021-02-01 | Discharge: 2021-02-01 | Disposition: A | Discharge status: Home / Self Care | Payer: Medicare PPO | Source: Ambulatory Visit | Attending: Oncology | Admitting: Oncology

## 2021-02-01 DIAGNOSIS — Z79899 Other long term (current) drug therapy: Secondary | ICD-10-CM | POA: Insufficient documentation

## 2021-02-01 DIAGNOSIS — L720 Epidermal cyst: Secondary | ICD-10-CM | POA: Diagnosis not present

## 2021-02-01 DIAGNOSIS — C3491 Malignant neoplasm of unspecified part of right bronchus or lung: Secondary | ICD-10-CM | POA: Diagnosis not present

## 2021-02-01 DIAGNOSIS — C538 Malignant neoplasm of overlapping sites of cervix uteri: Secondary | ICD-10-CM | POA: Diagnosis not present

## 2021-02-01 DIAGNOSIS — Z510 Encounter for antineoplastic radiation therapy: Secondary | ICD-10-CM | POA: Diagnosis not present

## 2021-02-01 DIAGNOSIS — I6782 Cerebral ischemia: Secondary | ICD-10-CM | POA: Diagnosis not present

## 2021-02-01 DIAGNOSIS — Z923 Personal history of irradiation: Secondary | ICD-10-CM | POA: Diagnosis not present

## 2021-02-01 DIAGNOSIS — C3490 Malignant neoplasm of unspecified part of unspecified bronchus or lung: Secondary | ICD-10-CM | POA: Diagnosis not present

## 2021-02-01 DIAGNOSIS — I10 Essential (primary) hypertension: Secondary | ICD-10-CM | POA: Diagnosis not present

## 2021-02-01 DIAGNOSIS — Z5111 Encounter for antineoplastic chemotherapy: Secondary | ICD-10-CM | POA: Diagnosis not present

## 2021-02-01 DIAGNOSIS — C770 Secondary and unspecified malignant neoplasm of lymph nodes of head, face and neck: Secondary | ICD-10-CM | POA: Diagnosis not present

## 2021-02-01 MED ORDER — GADOBUTROL 1 MMOL/ML IV SOLN
6.0000 mL | Freq: Once | INTRAVENOUS | Status: AC | PRN
  Administered 2021-02-01: 6 mL via INTRAVENOUS

## 2021-02-01 NOTE — Progress Notes (Signed)
Pharmacist Chemotherapy Monitoring - Initial Assessment    Anticipated start date: 02/09/21   The following has been reviewed per standard work regarding the patient's treatment regimen: The patient's diagnosis, treatment plan and drug doses, and organ/hematologic function Lab orders and baseline tests specific to treatment regimen  The treatment plan start date, drug sequencing, and pre-medications Prior authorization status  Patient's documented medication list, including drug-drug interaction screen and prescriptions for anti-emetics and supportive care specific to the treatment regimen The drug concentrations, fluid compatibility, administration routes, and timing of the medications to be used The patient's access for treatment and lifetime cumulative dose history, if applicable  The patient's medication allergies and previous infusion related reactions, if applicable   Changes made to treatment plan:  treatment plan date  Follow up needed:  dose adjustment of carbo due to no dose entered and signing treatment plan   Adelina Mings, [place], 02/01/2021  1:34 PM

## 2021-02-01 NOTE — Telephone Encounter (Signed)
-----   Message from Daiva Huge, RN sent at 02/01/2021  7:52 AM EST ----- Regarding: RE: chemo/rad She has simulation today.  V-sim on 1/10 and 1st tx on 1/11.   Ana ----- Message ----- From: Evelina Dun, RN Sent: 01/28/2021  12:51 PM EST To: Daiva Huge, RN, Caren Erlene Senters, CMA Subject: chemo/rad                                      Hello Ladies,   Will you guys let me know when pt will be starting radiation so we can schedule her first chemo tx.   Thanks  Benjamine Mola

## 2021-02-01 NOTE — Telephone Encounter (Signed)
Please schedule Lab/MD/ carboplatin/ taxol *new*  (weekly tx) on 1/11/ or 1/12 and notify pt of appt.

## 2021-02-03 ENCOUNTER — Inpatient Hospital Stay: Payer: Medicare PPO

## 2021-02-06 DIAGNOSIS — Z79899 Other long term (current) drug therapy: Secondary | ICD-10-CM | POA: Diagnosis not present

## 2021-02-06 DIAGNOSIS — C770 Secondary and unspecified malignant neoplasm of lymph nodes of head, face and neck: Secondary | ICD-10-CM | POA: Diagnosis not present

## 2021-02-06 DIAGNOSIS — Z510 Encounter for antineoplastic radiation therapy: Secondary | ICD-10-CM | POA: Diagnosis not present

## 2021-02-06 DIAGNOSIS — I10 Essential (primary) hypertension: Secondary | ICD-10-CM | POA: Diagnosis not present

## 2021-02-06 DIAGNOSIS — Z923 Personal history of irradiation: Secondary | ICD-10-CM | POA: Diagnosis not present

## 2021-02-06 DIAGNOSIS — Z5111 Encounter for antineoplastic chemotherapy: Secondary | ICD-10-CM | POA: Diagnosis not present

## 2021-02-06 DIAGNOSIS — C3491 Malignant neoplasm of unspecified part of right bronchus or lung: Secondary | ICD-10-CM | POA: Diagnosis not present

## 2021-02-08 ENCOUNTER — Ambulatory Visit: Admission: RE | Admit: 2021-02-08 | Payer: Medicare PPO | Source: Ambulatory Visit

## 2021-02-09 ENCOUNTER — Other Ambulatory Visit: Payer: Self-pay

## 2021-02-09 ENCOUNTER — Inpatient Hospital Stay: Payer: Medicare PPO

## 2021-02-09 ENCOUNTER — Ambulatory Visit
Admission: RE | Admit: 2021-02-09 | Discharge: 2021-02-09 | Disposition: A | Discharge status: Home / Self Care | Payer: Medicare PPO | Source: Ambulatory Visit | Attending: Radiation Oncology | Admitting: Radiation Oncology

## 2021-02-09 ENCOUNTER — Inpatient Hospital Stay (HOSPITAL_BASED_OUTPATIENT_CLINIC_OR_DEPARTMENT_OTHER): Payer: Medicare PPO | Admitting: Oncology

## 2021-02-09 ENCOUNTER — Encounter: Payer: Self-pay | Admitting: *Deleted

## 2021-02-09 ENCOUNTER — Encounter: Payer: Self-pay | Admitting: Oncology

## 2021-02-09 VITALS — BP 183/74 | HR 82 | Temp 96.9°F | Wt 136.0 lb

## 2021-02-09 DIAGNOSIS — Z510 Encounter for antineoplastic radiation therapy: Secondary | ICD-10-CM | POA: Diagnosis not present

## 2021-02-09 DIAGNOSIS — Z7189 Other specified counseling: Secondary | ICD-10-CM

## 2021-02-09 DIAGNOSIS — I10 Essential (primary) hypertension: Secondary | ICD-10-CM | POA: Insufficient documentation

## 2021-02-09 DIAGNOSIS — Z79899 Other long term (current) drug therapy: Secondary | ICD-10-CM | POA: Insufficient documentation

## 2021-02-09 DIAGNOSIS — Z923 Personal history of irradiation: Secondary | ICD-10-CM | POA: Insufficient documentation

## 2021-02-09 DIAGNOSIS — Z5111 Encounter for antineoplastic chemotherapy: Secondary | ICD-10-CM | POA: Diagnosis not present

## 2021-02-09 DIAGNOSIS — C3491 Malignant neoplasm of unspecified part of right bronchus or lung: Secondary | ICD-10-CM

## 2021-02-09 DIAGNOSIS — C770 Secondary and unspecified malignant neoplasm of lymph nodes of head, face and neck: Secondary | ICD-10-CM | POA: Diagnosis not present

## 2021-02-09 LAB — CBC WITH DIFFERENTIAL/PLATELET
Abs Immature Granulocytes: 0.03 10*3/uL (ref 0.00–0.07)
Basophils Absolute: 0 10*3/uL (ref 0.0–0.1)
Basophils Relative: 1 %
Eosinophils Absolute: 0.3 10*3/uL (ref 0.0–0.5)
Eosinophils Relative: 4 %
HCT: 33.2 % — ABNORMAL LOW (ref 36.0–46.0)
Hemoglobin: 11.3 g/dL — ABNORMAL LOW (ref 12.0–15.0)
Immature Granulocytes: 1 %
Lymphocytes Relative: 10 %
Lymphs Abs: 0.6 10*3/uL — ABNORMAL LOW (ref 0.7–4.0)
MCH: 32.8 pg (ref 26.0–34.0)
MCHC: 34 g/dL (ref 30.0–36.0)
MCV: 96.2 fL (ref 80.0–100.0)
Monocytes Absolute: 0.5 10*3/uL (ref 0.1–1.0)
Monocytes Relative: 8 %
Neutro Abs: 5.1 10*3/uL (ref 1.7–7.7)
Neutrophils Relative %: 76 %
Platelets: 270 10*3/uL (ref 150–400)
RBC: 3.45 MIL/uL — ABNORMAL LOW (ref 3.87–5.11)
RDW: 12.8 % (ref 11.5–15.5)
WBC: 6.6 10*3/uL (ref 4.0–10.5)
nRBC: 0 % (ref 0.0–0.2)

## 2021-02-09 LAB — COMPREHENSIVE METABOLIC PANEL
ALT: 13 U/L (ref 0–44)
AST: 17 U/L (ref 15–41)
Albumin: 3.8 g/dL (ref 3.5–5.0)
Alkaline Phosphatase: 64 U/L (ref 38–126)
Anion gap: 6 (ref 5–15)
BUN: 20 mg/dL (ref 8–23)
CO2: 25 mmol/L (ref 22–32)
Calcium: 8.9 mg/dL (ref 8.9–10.3)
Chloride: 101 mmol/L (ref 98–111)
Creatinine, Ser: 1.09 mg/dL — ABNORMAL HIGH (ref 0.44–1.00)
GFR, Estimated: 54 mL/min — ABNORMAL LOW (ref >60)
Glucose, Bld: 78 mg/dL (ref 70–99)
Potassium: 4.4 mmol/L (ref 3.5–5.1)
Sodium: 132 mmol/L — ABNORMAL LOW (ref 135–145)
Total Bilirubin: 0.6 mg/dL (ref 0.3–1.2)
Total Protein: 6.7 g/dL (ref 6.5–8.1)

## 2021-02-09 NOTE — Progress Notes (Signed)
Met with patient during follow up visit with Dr. Tasia Catchings to start chemotherapy. Due to elevated blood pressure, chemo treatment has been rescheduled to [facility] 1/16. New appts given to patient and reviewed. All questions answered during visit. Reviewed with patient to anticipate chemo treatments every Monday until radiation is completed in order to make plans for grandson's transportation to and from school those days. Pt verbalized understanding. Instructed pt to call with any further questions or needs.

## 2021-02-09 NOTE — Progress Notes (Signed)
Hematology/Oncology note [phone] [phone]   Patient Care Team: [name], DO as PCP - General (Family Medicine) Clent Jacks, RN as Oncology Nurse Navigator Noreene Filbert, MD as Radiation Oncologist (Radiation Oncology) Telford Nab, RN as Oncology Nurse Navigator  REFERRING PROVIDER: [name], DO  CHIEF COMPLAINTS/REASON FOR VISIT:  Follow up cervical cancer  HISTORY OF PRESENTING ILLNESS:  Mary Hoover is a  76 y.o.  female with PMH listed below was seen in consultation at the request of  [name], DO  for evaluation of cervical cancer Patient has developed postmenopausal vaginal bleeding and discharge.  She was noted to have a friable cervix/2 cm mass of the cervix, concerning for malignancy 12/19/2018 endometrial biopsy showed scattered atypical squamous cells suspicious for malignancy.  Predominantly necrosis with associated inflammation. 01/01/2019 initial cervix biopsy showed at least high-grade squamous intraepithelial lesion.-HPV negative 01/22/2019, repeat vaginal wall biopsy and cervix 9:00 biopsy showed squamous cell carcinoma.    Staging images 12/31/2018 showed fluid distending the endometrial canal and or endometrial thickening to the level of cervix.  No evidence of pathological lymphadenopathy.  Haziness about the lower cervix.  7 mm irregular pulmonary nodule within the right upper lobe, suspicious for possible primary or metastatic malignancy.  Chronic pleural-parenchymal scarring/fibrosis at the lung apices.  Large amount of stool.  No bowel obstruction. 01/08/2019 PET scan showed marked hypermetabolism in the region of the cervix, compatible with the reported history of cervical cancer. Small bilateral pelvic sidewall lymph nodes discernible FDG accumulation, concerning for metastatic disease although neither lymph node is enlarged by CT size criteria. 7 mm nodule in the right upper lobe shows discernible FDG  accumulation.  Questionable neoplasm, primary versus metastatic. Emphysema.   #Chronic hearing loss # Lung nodule, FDG avid, questionable primary bronchogenic carcinoma versus metastatic cancer. Discussed with radiation oncology.  Her case was also discussed on tumor board.  Consensus reached on finishing concurrent chemoradiation treatments for cervix cancer first. Possible SBRT to lung nodule for presumed primary lung cancer.  # 02/20/2020 - 03/19/2020 concurrent chemotherapy weekly carboplatin and taxol and radiation for treatment of this cervix cancer.  #December 2021 lung nodule,was evaluated by Dr.Oaks. Options of biopsy via transthoracic or endobronchial approach vs surgical resection.  Patient opted to empiric SBRT  Patient was last seen by me on 06/25/2019.  Lost follow-up. She continues to follow with radiation oncology. 11/20/2020, CT chest with contrast showed minimal residual of previously seen right upper lobe nodule.  0.6 x 0.4 cm.  Internal development of mild bandlike radiation fibrosis.  Newly enlarged pretracheal lymph node, measuring 2.5 x 1.7 cm.  Highly concerning for metastatic disease.  Unchanged prominent AP window and the right hilar lymph nodes.  Emphysema and diffuse bilateral bronchial wall thickening.  Coronary artery disease. 12/13/2020, PET scan showed enlarging hypermetabolic right lower paratracheal lymph node, compatible with metastatic disease.  SUV 10.3.  Mild FDG uptake of the bilateral sacral alla with associated lucency.  Concerning for sacral insufficiency fracture.  Stable posttreatment changes of the right upper lobe nodule.  Nonspecific small solid 3 mm pulmonary nodule of the left lower lobe.  2 small to be characterized.  No evidence of FDG avid metastatic disease in the abdomen or pelvis.  Aortic atherosclerosis and emphysema.  Patient reestablish care on 12/27/2020.  #01/12/2021 patient underwent bronchoscopy biopsy by Dr. Patsey Berthold Right paratracheal  lymph node fine-needle aspiration is positive for metastatic non-small cell carcinoma, favor adenocarcinoma of lung origin.  INTERVAL  HISTORY Mary Hoover is a 76 y.o. female who has above history reviewed by me today presents for follow up visit for management of possible recurrent lung cancer.  History of cervix cancer.   Patient's blood pressure was elevated at the clinic.  183/74. Patient says she needs to pick up her grandson and she is concerned about not able to finish her chemotherapy in time. Patient has received radiation this morning.   Review of Systems  Constitutional:  Negative for appetite change, chills, fatigue and fever.  HENT:   Positive for hearing loss. Negative for voice change.   Eyes:  Negative for eye problems.  Respiratory:  Negative for chest tightness and cough.   Cardiovascular:  Negative for chest Hoover.  Gastrointestinal:  Negative for abdominal distention, abdominal Hoover and blood in stool.  Endocrine: Negative for hot flashes.  Genitourinary:  Negative for difficulty urinating and frequency.   Musculoskeletal:  Negative for arthralgias.  Skin:  Negative for itching and rash.  Neurological:  Negative for extremity weakness.  Hematological:  Negative for adenopathy.  Psychiatric/Behavioral:  Negative for confusion.    MEDICAL HISTORY:  Past Medical History:  Diagnosis Date   Arthritis    Cancer ([place])    Carotid atherosclerosis    Carotid bruit    Cervical cancer (HCC)    COPD (chronic obstructive pulmonary disease) (HCC)    emphysema   Coronary artery disease    Family history of adverse reaction to anesthesia    paternal grandfather died during surgery-pt unaware what happened   GERD (gastroesophageal reflux disease)    Hyperlipidemia    Hypertension    Hyponatremia 02/27/2019   Hypothyroidism    Infected cat bite 1980s   was hospitalized   Irregular heartbeat    Medical history non-contributory    Peripheral vascular disease ([place])     Tobacco use     SURGICAL HISTORY: Past Surgical History:  Procedure Laterality Date   CAROTID ENDARTERECTOMY Right Oct. 2015   Dr. Lucky Cowboy   CAROTID STENOSIS Left April 2016   carotid stenosis surgery   COLONOSCOPY WITH PROPOFOL N/A 11/08/2016   Procedure: COLONOSCOPY WITH PROPOFOL;  Surgeon: Jonathon Bellows, MD;  Location: [facility] ENDOSCOPY;  Service: Gastroenterology;  Laterality: N/A;   ESOPHAGOGASTRODUODENOSCOPY (EGD) WITH PROPOFOL N/A 11/08/2016   Procedure: ESOPHAGOGASTRODUODENOSCOPY (EGD) WITH PROPOFOL;  Surgeon: Jonathon Bellows, MD;  Location: [facility] ENDOSCOPY;  Service: Gastroenterology;  Laterality: N/A;   FLEXIBLE BRONCHOSCOPY N/A 12/17/2014   Procedure: FLEXIBLE BRONCHOSCOPY;  Surgeon: Wilhelmina Mcardle, MD;  Location: [facility] ORS;  Service: Pulmonary;  Laterality: N/A;   INCISION AND DRAINAGE / EXCISION THYROGLOSSAL CYST  April 2011   VIDEO BRONCHOSCOPY WITH ENDOBRONCHIAL ULTRASOUND N/A 01/12/2021   Procedure: VIDEO BRONCHOSCOPY WITH ENDOBRONCHIAL ULTRASOUND;  Surgeon: Tyler Pita, MD;  Location: [facility] ORS;  Service: Cardiopulmonary;  Laterality: N/A;    SOCIAL HISTORY: Social History   Socioeconomic History   Marital status: Married    Spouse name: Not on file   Number of children: Not on file   Years of education: Not on file   Highest education level: 9th grade  Occupational History   Occupation: retired   Tobacco Use   Smoking status: Every Day    Packs/day: 1.75    Years: 50.00    Pack years: 87.50    Types: Cigarettes   Smokeless tobacco: Never   Tobacco comments:    0.5 PPD 01/05/2021  Vaping Use   Vaping Use: Former  Substance and Sexual Activity  Alcohol use: No    Alcohol/week: 0.0 standard drinks   Drug use: No   Sexual activity: Yes  Other Topics Concern   Not on file  Social History Narrative   Lives at home with husband   Social Determinants of Health   Financial Resource Strain: Not on file  Food Insecurity: Not on file  Transportation Needs: Not  on file  Physical Activity: Not on file  Stress: Not on file  Social Connections: Not on file  Intimate Partner Violence: Not on file    FAMILY HISTORY: Family History  Problem Relation Age of Onset   Congestive Heart Failure Mother    Heart disease Mother    Hypertension Mother    Hypertension Sister    Diabetes Sister    Heart disease Sister    Heart disease Maternal Uncle    Heart disease Maternal Grandmother    Stroke Maternal Grandfather    Cancer Brother        liver, lung   Heart disease Brother    Hypertension Brother    Heart attack Brother    COPD Neg Hx    Breast cancer Neg Hx     ALLERGIES:  is allergic to atorvastatin.  MEDICATIONS:  Current Outpatient Medications  Medication Sig Dispense Refill   acetaminophen (TYLENOL) 500 MG tablet Take 500 mg by mouth every 6 (six) hours as needed for moderate Hoover.     clopidogrel (PLAVIX) 75 MG tablet Take 1 tablet (75 mg total) by mouth daily. 90 tablet 3   cyclobenzaprine (FLEXERIL) 10 MG tablet TAKE 1 TABLET AT BEDTIME 60 tablet 3   dexamethasone (DECADRON) 4 MG tablet Take 2 tablets (8 mg total) by mouth daily. Take for 2 days after chemotherapy. . 30 tablet 1   ferrous sulfate 325 (65 FE) MG EC tablet Take 1 tablet (325 mg total) by mouth daily with breakfast. 90 tablet 4   Fluticasone-Umeclidin-Vilant (TRELEGY ELLIPTA) 100-62.5-25 MCG/INH AEPB Inhale 1 puff into the lungs daily. 1 each 11   gabapentin (NEURONTIN) 100 MG capsule Take 1 capsule (100 mg total) by mouth 3 (three) times daily. 270 capsule 1   levothyroxine (SYNTHROID) 75 MCG tablet Take 1 tablet (75 mcg total) by mouth daily. 90 tablet 3   lisinopril (ZESTRIL) 20 MG tablet Take 1 tablet (20 mg total) by mouth daily. (Patient taking differently: Take 20 mg by mouth every evening.) 90 tablet 1   montelukast (SINGULAIR) 10 MG tablet Take 1 tablet (10 mg total) by mouth See admin instructions. Take 9m on the day before chemotherapy 30 tablet 0   Multiple  Vitamin (MULTIVITAMIN) tablet Take 1 tablet by mouth daily.     naproxen (NAPROSYN) 500 MG tablet TAKE 1 TABLET TWICE DAILY WITH MEALS 180 tablet 1   omeprazole (PRILOSEC) 20 MG capsule Take 1 capsule (20 mg total) by mouth daily. (Patient taking differently: Take 20 mg by mouth daily before lunch.) 90 capsule 1   ondansetron (ZOFRAN) 8 MG tablet Take 1 tablet (8 mg total) by mouth 2 (two) times daily as needed for refractory nausea / vomiting. Start on day 3 after chemo. 30 tablet 1   vitamin B-12 (CYANOCOBALAMIN) 500 MCG tablet Take 500 mcg by mouth daily.     No current facility-administered medications for this visit.     PHYSICAL EXAMINATION: ECOG PERFORMANCE STATUS: 1 - Symptomatic but completely ambulatory Vitals:   02/09/21 1009  BP: (!) 183/74  Pulse: 82  Temp: (!) 96.9 F (36.1 C)  Filed Weights   02/09/21 1009  Weight: 136 lb (61.7 kg)    Physical Exam Constitutional:      General: She is not in acute distress. HENT:     Head: Normocephalic and atraumatic.  Eyes:     General: No scleral icterus.    Pupils: Pupils are equal, round, and reactive to light.  Cardiovascular:     Rate and Rhythm: Normal rate and regular rhythm.     Heart sounds: Normal heart sounds.  Pulmonary:     Effort: Pulmonary effort is normal. No respiratory distress.     Breath sounds: No wheezing.     Comments: Decreased breath sound bilaterally  Abdominal:     General: Bowel sounds are normal. There is no distension.     Palpations: Abdomen is soft. There is no mass.     Tenderness: There is no abdominal tenderness.  Musculoskeletal:        General: No deformity. Normal range of motion.     Cervical back: Normal range of motion and neck supple.  Skin:    General: Skin is warm and dry.     Findings: No erythema or rash.  Neurological:     Mental Status: She is alert and oriented to person, place, and time. Mental status is at baseline.     Cranial Nerves: No cranial nerve deficit.      Coordination: Coordination normal.  Psychiatric:        Mood and Affect: Mood normal.    LABORATORY DATA:  I have reviewed the data as listed Lab Results  Component Value Date   WBC 6.6 02/09/2021   HGB 11.3 (L) 02/09/2021   HCT 33.2 (L) 02/09/2021   MCV 96.2 02/09/2021   PLT 270 02/09/2021   Recent Labs    05/20/20 1331 11/09/20 1442 01/04/21 1452 02/09/21 0900  NA 130* 130* 132* 132*  K 4.9 4.3 4.5 4.4  CL 94* 95* 96 101  CO2 21 19* 22 25  GLUCOSE 83 83 74 78  BUN _0 CREATININE 1.05* 1.01* 0.97 1.09*  CALCIUM 9.2 9.1 9.3 8.9  GFRNONAA  --   --   --  54*  PROT 7.0 6.3  --  6.7  ALBUMIN 4.3 4.2  --  3.8  AST 13 17  --  17  ALT 7 9  --  13  ALKPHOS 108 74  --  64  BILITOT 0.3 0.2  --  0.6    Iron/TIBC/Ferritin/ %Sat    Component Value Date/Time   IRON 55 10/31/2016 1546   TIBC 339 10/31/2016 1546   FERRITIN 18 10/31/2016 1546   IRONPCTSAT 16 10/31/2016 1546      RADIOGRAPHIC STUDIES: I have personally reviewed the radiological images as listed and agreed with the findings in the report. CT Chest W Contrast  Result Date: 11/20/2020 CLINICAL DATA:  Follow-up non-small cell lung cancer, status post radiation therapy to a presumed malignancy of the right upper lobe EXAM: CT CHEST WITH CONTRAST TECHNIQUE: Multidetector CT imaging of the chest was performed during intravenous contrast administration. CONTRAST:  62m OMNIPAQUE IOHEXOL 300 MG/ML  SOLN COMPARISON:  05/17/2020, PET-CT, 11/27/2019 FINDINGS: Cardiovascular: Aortic atherosclerosis. Normal heart size. Three-vessel coronary artery calcifications. No pericardial effusion. Mediastinum/Nodes: Newly enlarged pretracheal lymph node, measuring 2.5 x 1.7 cm (series 2, image 49). Unchanged prominent AP window node measuring up to 1.5 x 1.0 cm (series 2, image 55) and right hilar node measuring 1.4 x 1.0 cm (series  2, image 62). Thyroid gland, trachea, and esophagus demonstrate no significant findings.  Lungs/Pleura: Moderate centrilobular emphysema. Diffuse bilateral bronchial wall thickening. There is a minimal residual of a previously seen right upper lobe nodule, measuring no greater than 0.6 x 0.4 cm, previously 0.8 x 0.6 cm (series 3, image 46). Interval development of mild, bandlike radiation fibrosis in this vicinity. No pleural effusion or pneumothorax. Upper Abdomen: No acute abnormality. Musculoskeletal: No chest wall mass or suspicious bone lesions identified. IMPRESSION: 1. There is a minimal residual of a previously seen right upper lobe nodule, measuring no greater than 0.6 x 0.4 cm, previously 0.8 x 0.6 cm. Interval development of mild, bandlike radiation fibrosis in this vicinity. Findings are consistent with treatment response to radiation therapy. 2. Newly enlarged pretracheal lymph node, measuring 2.5 x 1.7 cm, highly concerning for metastatic disease. 3. Unchanged prominent AP window and right hilar lymph nodes, which remain more nonspecific, previously with equivocal FDG avidity by PET. 4. Emphysema and diffuse bilateral bronchial wall thickening. 5. Coronary artery disease. Aortic Atherosclerosis (ICD10-I70.0). Electronically Signed   By: Delanna Ahmadi M.D.   On: 11/20/2020 10:07   MR Brain W Wo Contrast  Result Date: 02/01/2021 CLINICAL DATA:  Malignant neoplasm of overlapping sites of cervix ([place]) C53.8 (ICD-10-CM). Lung cancer follow up. EXAM: MRI HEAD WITHOUT AND WITH CONTRAST TECHNIQUE: Multiplanar, multiecho pulse sequences of the brain and surrounding structures were obtained without and with intravenous contrast. CONTRAST:  69m GADAVIST GADOBUTROL 1 MMOL/ML IV SOLN COMPARISON:  MRI of the brain August 18, 2004. FINDINGS: Brain: No acute infarction, hemorrhage, hydrocephalus, extra-axial collection or mass lesion. Scattered foci of T2 hyperintensity are seen within the white matter of cerebral hemispheres, most likely small vessel ischemia, mildly progressed since prior MRI. A few  punctate foci of susceptibility artifact are seen scattered in the right cerebral hemisphere, may represent hemosiderin deposits. No focus of abnormal contrast enhancement to suggest intracranial metastatic disease. Vascular: Normal flow voids. Skull and upper cervical spine: Normal marrow signal. Sinuses/Orbits: Negative. Other: Trace mucosal thickening in the bilateral mastoid cells. Epidermal inclusion cyst measuring 1.4 cm posteriorly in the neck subcutaneous. IMPRESSION: 1. No evidence of intracranial metastatic disease. 2. Mild-to-moderate chronic microvascular ischemic changes of the white. Electronically Signed   By: KPedro EarlsM.D.   On: 02/01/2021 13:53   MR Lumbar Spine Wo Contrast  Result Date: 01/21/2021 CLINICAL DATA:  Low back Hoover, increased fracture risk ?sacral insuffiency fracture on PET scan EXAM: MRI LUMBAR SPINE WITHOUT CONTRAST TECHNIQUE: Multiplanar, multisequence MR imaging of the lumbar spine was performed. No intravenous contrast was administered. COMPARISON:  PET-CT 12/13/2020, x-ray 05/20/2020 FINDINGS: Segmentation: Transitional lumbosacral anatomy with partial sacralization of the L5 segment. Lowest rib-bearing segment is labeled as T12 (axial series 8, image 6). Alignment:  Trace retrolisthesis of L4 on L5. Vertebrae: Mild superior endplate compression fracture of L5 with approximately 15% vertebral body height loss. Mild bone marrow edema within the L5 vertebral body. There is extensive bone marrow edema within the bilateral sacral ala associated with bilateral sacral fractures, likely subacute given findings on previous PET-CT. Remaining vertebral bodies of the lumbar spine are intact with preserved height. No additional fracture. No evidence of discitis. No suspicious marrow replacing bone lesion. Conus medullaris and cauda equina: Conus extends to the L1 level. Conus and cauda equina appear normal. Paraspinal and other soft tissues: Negative. Disc levels:  T12-L1: Minimal disc bulge. Mild bilateral facet arthropathy. No foraminal or canal stenosis. L1-L2: Minimal disc  bulge with small left foraminal protrusion. Mild bilateral facet hypertrophy. No foraminal or canal stenosis. L2-L3: Mild annular disc bulge. Mild bilateral facet arthropathy with ligamentum flavum buckling. Findings result in mild canal stenosis. No significant foraminal stenosis. L3-L4: Mild annular disc bulge. Mild bilateral facet arthropathy with ligamentum flavum buckling. Findings result in mild canal stenosis. No significant foraminal stenosis. L4-L5: Retrolisthesis with disc bulge and endplate ridging. Mild-moderate bilateral facet arthropathy with ligamentum flavum buckling. Mild bilateral subarticular recess stenosis without canal stenosis. No significant foraminal stenosis. L5-S1: Transitional level.  No evidence of impingement. IMPRESSION: 1. Transitional lumbosacral anatomy, as above. 2. Subacute-appearing mild superior endplate compression fracture of L5 with approximately 15% vertebral body height loss. 3. Extensive bone marrow edema within the bilateral sacral ala associated with bilateral sacral fractures, likely subacute given findings on previous PET-CT. 4. Mild multilevel degenerative changes of the lumbar spine as described. 5. Mild canal stenosis at L2-3 and L3-4. 6. Mild bilateral subarticular recess stenosis at L4-5. Electronically Signed   By: Davina Poke D.O.   On: 01/21/2021 09:10   NM PET Image Restag (PS) Skull Base To Thigh  Result Date: 12/14/2020 CLINICAL DATA:  Follow-up treatment strategy for non-small cell lung cancer. EXAM: NUCLEAR MEDICINE PET SKULL BASE TO THIGH TECHNIQUE: 7.0 mCi F-18 FDG was injected intravenously. Full-ring PET imaging was performed from the skull base to thigh after the radiotracer. CT data was obtained and used for attenuation correction and anatomic localization. Fasting blood glucose: 79 mg/dl COMPARISON:  Chest CT dated November 18, 2020 FINDINGS: Mediastinal blood pool activity: SUV max 2.1 Liver activity: SUV max 2.8 NECK: No hypermetabolic lymph nodes in the neck. Incidental CT findings: none CHEST: Enlarged and hypermetabolic right lower paratracheal lymph node measuring 1.4 cm in short axis on series 3, image 85 with an SUV max of 10.3, unchanged in size when compared with prior in remeasured in similar plane. Unchanged prominent AP window and right hilar lymph nodes which demonstrate no hypermetabolic activity. No additional hypermetabolic lymph nodes seen in the chest. Right upper lobe ground-glass opacities with mild FDG uptake below mediastinal blood pool, likely post radiation change. Tiny solid right upper lobe pulmonary nodule measuring approximately 5 mm on image 81. Pulmonary nodule of the left lower lobe measuring 3 mm on series 3, image 117, unchanged compared to prior, but new when compared with more remote prior exams. Incidental CT findings: Centrilobular emphysema. Three-vessel coronary artery calcifications. Atherosclerotic disease of the thoracic aorta. ABDOMEN/PELVIS: No abnormal hypermetabolic activity within the liver, pancreas, adrenal glands, or spleen. No hypermetabolic lymph nodes in the abdomen or pelvis. Incidental CT findings: Atherosclerotic disease of the thoracic aorta. Calcifications of the liver, likely sequela prior granulomatous infection. SKELETON: Mild diffuse FDG uptake of the bilateral sacral ala with associated lucency, SUV max of 2.6. Otherwise no focal FDG uptake. Incidental CT findings: Unchanged small sclerotic lesions of the left iliac bone, likely bone islands. IMPRESSION: 1. Enlarged and hypermetabolic right lower paratracheal lymph node, compatible with metastatic disease. 2. Mild FDG uptake of the bilateral sacral ala with associated lucency. Concerning for sacral insufficiency fractures. This could be further evaluated with MRI of the pelvis. 3. Stable posttreatment changes of the right  upper lobe. 4. Nonspecific small solid 3 mm pulmonary nodule of the left lower lobe, too small to assess for FDG avidity. Nodules unchanged compared to most recent prior, but new when compared with more remote priors. Recommend attention on follow-up. 5. No evidence of FDG avid metastatic disease  in the abdomen or pelvis. 6. Aortic Atherosclerosis (ICD10-I70.0) and Emphysema (ICD10-J43.9). These results will be called to the ordering clinician or representative by the Radiologist Assistant, and communication documented in the PACS or [employer]. Electronically Signed   By: Yetta Glassman M.D.   On: 12/14/2020 10:58   DG Bone Density  Result Date: 01/25/2021 EXAM: DUAL X-RAY ABSORPTIOMETRY (DXA) FOR BONE MINERAL DENSITY IMPRESSION: crr Your patient Mary Hoover completed a BMD test on 01/25/2021 using the [employer] (analysis version: 14.10) manufactured by [employer]. The following summarizes the results of our evaluation. PATIENT BIOGRAPHICAL: Name: Duane, Trias Patient ID: [id number] Birth Date: 1945-12-24 Height: 67.2 in. Gender: Female Exam Date: 01/25/2021 Weight: 132.2 lbs. Indications: Advanced Age, arthritis, Caucasian, copd, emphysema, Height Loss, History of Fracture (Adult), hx cervical ca, hypothyroid, Low Body Weight, lung ca, POSTmenopausal, Tobacco User Fractures: coccyx Treatments: GABAPENTIN, levothyroxine, multivitamin, plavix, trelegy ASSESSMENT: The BMD measured at Femur Total Right is 0.522 g/cm2 with a T-score of -3.9. This patient is considered osteoporotic according to [facility]) criteria. Lumbar spine was not utilized due to advanced degenerative changes.The scan quality is limited by exclusion of L-spine. Site Region Measured Measured WHO Young Adult BMD Date       Age      Classification T-score DualFemur Total Right 01/25/2023 73.8 Osteoporosis -3.9 0.522 g/cm2 Left Forearm Radius 33% 01/25/2021 73.8 Osteoporosis -3.7  0.556 g/cm2 World Health Organization [facility]) criteria for post-menopausal, Caucasian Women: Normal:       T-score at or above -1 SD Osteopenia:   T-score between -1 and -2.5 SD Osteoporosis: T-score at or below -2.5 SD RECOMMENDATIONS: 1. All patients should optimize calcium and vitamin D intake. 2. Consider FDA-approved medical therapies in postmenopausal women and men aged 108 years and older, based on the following: a. A hip or vertebral (clinical or morphometric) fracture b. T-score < -2.5 at the femoral neck or spine after appropriate evaluation to exclude secondary causes c. Low bone mass (T-score between -1.0 and -2.5 at the femoral neck or spine) and a 10-year probability of a hip fracture > 3% or a 10-year probability of a major osteoporosis-related fracture > 20% based on the [place]-adapted WHO algorithm d. Clinician judgment and/or patient preferences may indicate treatment for people with 10-year fracture probabilities above or below these levels FOLLOW-UP: People with diagnosed cases of osteoporosis or at high risk for fracture should have regular bone mineral density tests. For patients eligible for Medicare, routine testing is allowed once every 2 years. The testing frequency can be increased to one year for patients who have rapidly progressing disease, those who are receiving or discontinuing medical therapy to restore bone mass, or have additional risk factors. I have reviewed this report, and agree with the above findings. [facility] Electronically Signed   By: Franki Cabot M.D.   On: 01/25/2021 15:11   MM 3D SCREEN BREAST BILATERAL  Result Date: 01/26/2021 CLINICAL DATA:  Screening. EXAM: DIGITAL SCREENING BILATERAL MAMMOGRAM WITH TOMOSYNTHESIS AND CAD TECHNIQUE: Bilateral screening digital craniocaudal and mediolateral oblique mammograms were obtained. Bilateral screening digital breast tomosynthesis was performed. The images were evaluated with computer-aided detection. COMPARISON:   None. ACR Breast Density Category c: The breast tissue is heterogeneously dense, which may obscure small masses. FINDINGS: In the right breast, calcifications warrant further evaluation with magnified views. In the left breast, no findings suspicious for malignancy. IMPRESSION: Further evaluation is suggested for calcifications in the right breast. RECOMMENDATION: Diagnostic mammogram  of the right breast. (Code:[id number]) The patient will be contacted regarding the findings, and additional imaging will be scheduled. BI-RADS CATEGORY  0: Incomplete. Need additional imaging evaluation and/or prior mammograms for comparison. Electronically Signed   By: Nolon Nations M.D.   On: 01/26/2021 07:35      ASSESSMENT & PLAN:  1. Primary adenocarcinoma of right lung ([place])   2. Goals of care, counseling/discussion   3. Encounter for antineoplastic chemotherapy    Cancer Staging  Cervical cancer ([place]) Staging form: Cervix Uteri, AJCC Version 9 - Clinical: FIGO Stage IIICr (cTX, cN1) - Unsigned  Primary lung adenocarcinoma ([place]) Staging form: Lung, AJCC 7th Edition - Clinical stage from 01/12/2021: T1, N1, M0 - Signed by [name], MD on 01/29/2021   #Stage III lung adenocarcinoma, recurrent  Plan is to proceed with concurrent chemotherapy and radiation. Hold chemotherapy due to elevated blood pressure.  #Hypertension, likely secondary to anxiety.  Patient denies any focal deficits, headache. Repeat blood pressure in the clinic shows no improvement.  Chemotherapy was canceled.  Patient will return to the clinic next week and will be evaluated by covering provider on 02/15/2020.  # #Locally advanced cervical cancer stage IIIC1 with pelvic nodes involvement. S/p concurrent chemotherapy and radiation followed by brachytherapy.  She is not actively following up with gynecology oncology.  I recommend patient to reestablish care.  All questions were answered. The patient knows to call the clinic with any  problems questions or concerns.   Return of visit:  02/15/2020 lab NP carboplatin/Taxol 02/22/2020, lab MD carboplatin Taxol.   [name], MD, PhD 02/09/2021

## 2021-02-10 ENCOUNTER — Ambulatory Visit: Payer: Medicare PPO | Admitting: Pulmonary Disease

## 2021-02-10 ENCOUNTER — Encounter: Payer: Self-pay | Admitting: Pulmonary Disease

## 2021-02-10 ENCOUNTER — Ambulatory Visit
Admission: RE | Admit: 2021-02-10 | Discharge: 2021-02-10 | Disposition: A | Discharge status: Home / Self Care | Payer: Medicare PPO | Source: Ambulatory Visit | Attending: Radiation Oncology | Admitting: Radiation Oncology

## 2021-02-10 VITALS — BP 120/70 | HR 91 | Temp 97.1°F | Ht 67.0 in | Wt 137.0 lb

## 2021-02-10 DIAGNOSIS — F17210 Nicotine dependence, cigarettes, uncomplicated: Secondary | ICD-10-CM | POA: Diagnosis not present

## 2021-02-10 DIAGNOSIS — J449 Chronic obstructive pulmonary disease, unspecified: Secondary | ICD-10-CM | POA: Diagnosis not present

## 2021-02-10 DIAGNOSIS — Z79899 Other long term (current) drug therapy: Secondary | ICD-10-CM | POA: Diagnosis not present

## 2021-02-10 DIAGNOSIS — Z510 Encounter for antineoplastic radiation therapy: Secondary | ICD-10-CM | POA: Diagnosis not present

## 2021-02-10 DIAGNOSIS — Z923 Personal history of irradiation: Secondary | ICD-10-CM | POA: Diagnosis not present

## 2021-02-10 DIAGNOSIS — C3491 Malignant neoplasm of unspecified part of right bronchus or lung: Secondary | ICD-10-CM

## 2021-02-10 DIAGNOSIS — Z5111 Encounter for antineoplastic chemotherapy: Secondary | ICD-10-CM | POA: Diagnosis not present

## 2021-02-10 DIAGNOSIS — I10 Essential (primary) hypertension: Secondary | ICD-10-CM | POA: Diagnosis not present

## 2021-02-10 DIAGNOSIS — C770 Secondary and unspecified malignant neoplasm of lymph nodes of head, face and neck: Secondary | ICD-10-CM | POA: Diagnosis not present

## 2021-02-10 NOTE — Patient Instructions (Addendum)
Continue using your Trelegy (inhaler).  We will see him in follow-up in 4 months time call sooner should any new problems arise.  Hayley will go over your chemo instructions again tomorrow during your radiation therapy.

## 2021-02-10 NOTE — Progress Notes (Signed)
Subjective:    Patient ID: Mary [facility], female    DOB: 1945-08-24, 76 y.o.   MRN: [id number] Chief Complaint  Patient presents with   Follow-up    HPI Patient is a 76 year old current smoker (0.5 PPD) with a history as noted below who presents for follow-up after bronchoscopy with endobronchial ultrasound and TBNA on 01/12/2021.  This is a scheduled visit.  She has stage II COPD and currently with good symptom control on Trelegy 100 and as needed albuterol.  Patient was diagnosed with adenocarcinoma of lung origin by EBUS performed 12/14.  She had subsequently a Circulogene genomic test which showed no significant mutations.  Patient is currently undergoing chemo/XRT managed by Dr. [name] and Dr. Noreene Filbert.  She presents today with no respiratory complaints.  She did not have any issues post EBUS.  We did discuss strategies to quit smoking as this will help her with her cancer therapy.  She does not endorse any other complaints or issues today.  Overall she feels well and looks well.  PRIOR PULMONARY HISTORY: 12/11/14 - 12/12/14 Hospitalized for hemoptysis. CT chest revealed LUL nodular infiltrate 12/12/14 Pulmonary consultation. CT chest findings noted. Pt was on ASA, clopidogrel which were held. Pt discharged on course of abx and pred with plan for outpt FOB 12/17/14 FOB: no active bleeding. Washings, brushings, TBBx in LUL were negative for cytology. Pt instructed to resume ASA and remain off clopidogrel until seen in office 12/28/14 Office follow up: counseled re: smoking cessation. Repeat CT chest ordered 02/02/15 CT chest: complete resolution of confluent airspace opacities/ground-glass opacities of the left upper lobe. Extensive centrilobular and paraseptal emphysema, most pronounced at the lung apices. Evidence of prior granulomatous disease with calcified granulomas of the right lung and the liver. 02/25/15 Office follow up: Ct findings reviewed with patient. Smoking  cessation counseling again provided 04/02/15 PFTs: mild obstruction, moderately to severely reduced DLCO.  FEV1 1.81 L or 66% predicted, FVC 2.51 L or 70% predicted, FEV1/FVC 72% no significant bronchodilator response 04/05/15 ROV: No new complaints. Unable to quit smoking without medical assistance. Chantix initiated 05/05/2019: Evaluation by Dr. Nestor Lewandowsky for consideration of resection of a 7 mm nodule in the right upper lobe 05/12/2019 PFTs: FEV1 1.68 L or 64% predicted, FVC 2.38 L or 69% predicted, FEV1/FVC 71%.  No bronchodilator response no hyperinflation or air trapping.  Moderate reduction in DLCO overall stable from prior but improvement on DLCO 05/16/2019: Follow-up with Dr. Genevive Bi, patient opted for continued observation with SBRT if lesion continued to grow 12/03/2019: Follow-up with Dr. Baruch Gouty, SBRT to the right upper lobe lesion recommended due to increased growth and persistent FDG activity 05/17/2020 chest CT with contrast: Decrease in the right upper lobe spiculated lesion indicating treatment response, specifically no pathology, mediastinal adenopathy noted 11/18/2020 CT chest with contrast: Newly enlarged paratracheal lymph node measuring 2.5 x 1.7 cm, prominent AP window node 1.5 x 1.0 cm and right hilar node measuring 1.4 x 1.0 cm, radiation fibrosis to the area of prior SBRT in the right upper lobe.  Adenopathy concerning for metastatic disease 12/13/2020 PET/CT: Increased FDG activity of the right paratracheal lymph node, post treatment changes in the right upper lobe, right hilar node and AP window node without FDG activity 01/12/2021 bronchoscopy with EBUS/TBNA: Metastatic adenocarcinoma of lung origin on right paratracheal lymphadenopathy   Review of Systems A 10 point review of systems was performed and it is as noted above otherwise negative.    Objective:  Physical Exam BP 120/70 (BP Location: Left Arm, Patient Position: Sitting, Cuff Size: Normal)    Pulse 91    Temp  (!) 97.1 F (36.2 C) (Oral)    Ht 5\' 7"  (1.702 m)    Wt 137 lb (62.1 kg)    SpO2 97%    BMI 21.46 kg/m  GENERAL: Thin well-developed woman, no acute distress.  No conversational dyspnea.  Fully ambulatory. HEAD: Normocephalic, atraumatic.  EYES: Pupils equal, round, reactive to light.  No scleral icterus.  MOUTH: Nose/mouth/throat not examined due to masking requirements for COVID 19. NECK: Supple. No thyromegaly. Trachea midline. No JVD.  No adenopathy. PULMONARY: Good air entry bilaterally.  Coarse with no other adventitious sounds. CARDIOVASCULAR: S1 and S2. Regular rate and rhythm.  No rubs, murmurs or gallops heard. ABDOMEN: Benign. MUSCULOSKELETAL: No joint deformity, no clubbing, no edema.  NEUROLOGIC: No focal deficit, no gait disturbance, speech is fluent. SKIN: Intact,warm,dry. PSYCH: Mood and behavior normal     Assessment & Plan:     ICD-10-CM   1. Stage 2 moderate COPD by GOLD classification ([place])  J44.9    Doing well on Trelegy Ellipta 100 Albuterol as needed Follow-up 4 months time    2. Adenocarcinoma of right lung ([facility])  C34.91    Stage III Undergoing concurrent chemoradiation This issue adds complexity to her management Following up with oncology, Dr. Tasia Catchings    3. Tobacco dependence due to cigarettes  F17.210    Patient counseled regards to discontinuation of smoking Total counseling time 3 to 5 minutes     The patient in follow-up in 4 months time she is to contact us prior to that time should any new difficulties arise.   Renold Don, MD Advanced Bronchoscopy PCCM [facility]    *This note was dictated using voice recognition software/Dragon.  Despite best efforts to proofread, errors can occur which can change the meaning. Any transcriptional errors that result from this process are unintentional and may not be fully corrected at the time of dictation.

## 2021-02-11 ENCOUNTER — Encounter: Payer: Self-pay | Admitting: *Deleted

## 2021-02-11 ENCOUNTER — Other Ambulatory Visit: Payer: Self-pay | Admitting: Family Medicine

## 2021-02-11 ENCOUNTER — Ambulatory Visit
Admission: RE | Admit: 2021-02-11 | Discharge: 2021-02-11 | Disposition: A | Discharge status: Home / Self Care | Payer: Medicare PPO | Source: Ambulatory Visit | Attending: Radiation Oncology | Admitting: Radiation Oncology

## 2021-02-11 DIAGNOSIS — C3491 Malignant neoplasm of unspecified part of right bronchus or lung: Secondary | ICD-10-CM | POA: Diagnosis not present

## 2021-02-11 DIAGNOSIS — Z79899 Other long term (current) drug therapy: Secondary | ICD-10-CM | POA: Diagnosis not present

## 2021-02-11 DIAGNOSIS — Z5111 Encounter for antineoplastic chemotherapy: Secondary | ICD-10-CM | POA: Diagnosis not present

## 2021-02-11 DIAGNOSIS — Z510 Encounter for antineoplastic radiation therapy: Secondary | ICD-10-CM | POA: Diagnosis not present

## 2021-02-11 DIAGNOSIS — I10 Essential (primary) hypertension: Secondary | ICD-10-CM | POA: Diagnosis not present

## 2021-02-11 DIAGNOSIS — Z923 Personal history of irradiation: Secondary | ICD-10-CM | POA: Diagnosis not present

## 2021-02-11 DIAGNOSIS — C770 Secondary and unspecified malignant neoplasm of lymph nodes of head, face and neck: Secondary | ICD-10-CM | POA: Diagnosis not present

## 2021-02-11 MED ORDER — ALENDRONATE SODIUM 70 MG PO TABS: 70.0000 mg | ORAL_TABLET | ORAL | 3 refills | Status: DC

## 2021-02-11 NOTE — Progress Notes (Signed)
Met with patient after radiation to review prescriptions sent in by Dr. Yu to take with chemotherapy treatments. Provided pt with med list and instructions on how to take decadron, singulair, and zofran. Reviewed upcoming appt on Monday to start chemotherapy. Pt stated that she has family support in place to help with her grandson while she is receiving chemo. All questions answered. Instructed pt to call with any questions or needs. Pt verbalized understanding. Nothing further needed at this time. °

## 2021-02-14 ENCOUNTER — Inpatient Hospital Stay: Payer: Medicare PPO

## 2021-02-14 ENCOUNTER — Other Ambulatory Visit: Payer: Self-pay

## 2021-02-14 ENCOUNTER — Inpatient Hospital Stay (HOSPITAL_BASED_OUTPATIENT_CLINIC_OR_DEPARTMENT_OTHER): Payer: Medicare PPO | Admitting: Nurse Practitioner

## 2021-02-14 ENCOUNTER — Ambulatory Visit
Admission: RE | Admit: 2021-02-14 | Discharge: 2021-02-14 | Disposition: A | Discharge status: Home / Self Care | Payer: Medicare PPO | Source: Ambulatory Visit | Attending: Radiation Oncology | Admitting: Radiation Oncology

## 2021-02-14 VITALS — BP 156/81 | HR 83 | Temp 96.9°F | Resp 16 | Wt 135.1 lb

## 2021-02-14 VITALS — BP 156/56 | HR 72 | Resp 18

## 2021-02-14 DIAGNOSIS — C3491 Malignant neoplasm of unspecified part of right bronchus or lung: Secondary | ICD-10-CM | POA: Diagnosis not present

## 2021-02-14 DIAGNOSIS — Z923 Personal history of irradiation: Secondary | ICD-10-CM | POA: Diagnosis not present

## 2021-02-14 DIAGNOSIS — Z510 Encounter for antineoplastic radiation therapy: Secondary | ICD-10-CM | POA: Diagnosis not present

## 2021-02-14 DIAGNOSIS — C770 Secondary and unspecified malignant neoplasm of lymph nodes of head, face and neck: Secondary | ICD-10-CM | POA: Diagnosis not present

## 2021-02-14 DIAGNOSIS — C538 Malignant neoplasm of overlapping sites of cervix uteri: Secondary | ICD-10-CM | POA: Diagnosis not present

## 2021-02-14 DIAGNOSIS — Z5111 Encounter for antineoplastic chemotherapy: Secondary | ICD-10-CM | POA: Diagnosis not present

## 2021-02-14 DIAGNOSIS — I10 Essential (primary) hypertension: Secondary | ICD-10-CM | POA: Diagnosis not present

## 2021-02-14 DIAGNOSIS — Z79899 Other long term (current) drug therapy: Secondary | ICD-10-CM | POA: Diagnosis not present

## 2021-02-14 LAB — CBC WITH DIFFERENTIAL/PLATELET
Abs Immature Granulocytes: 0.04 10*3/uL (ref 0.00–0.07)
Basophils Absolute: 0 10*3/uL (ref 0.0–0.1)
Basophils Relative: 1 %
Eosinophils Absolute: 0.3 10*3/uL (ref 0.0–0.5)
Eosinophils Relative: 4 %
HCT: 35.3 % — ABNORMAL LOW (ref 36.0–46.0)
Hemoglobin: 11.9 g/dL — ABNORMAL LOW (ref 12.0–15.0)
Immature Granulocytes: 1 %
Lymphocytes Relative: 10 %
Lymphs Abs: 0.7 10*3/uL (ref 0.7–4.0)
MCH: 32.4 pg (ref 26.0–34.0)
MCHC: 33.7 g/dL (ref 30.0–36.0)
MCV: 96.2 fL (ref 80.0–100.0)
Monocytes Absolute: 0.6 10*3/uL (ref 0.1–1.0)
Monocytes Relative: 8 %
Neutro Abs: 5.5 10*3/uL (ref 1.7–7.7)
Neutrophils Relative %: 76 %
Platelets: 286 10*3/uL (ref 150–400)
RBC: 3.67 MIL/uL — ABNORMAL LOW (ref 3.87–5.11)
RDW: 12.7 % (ref 11.5–15.5)
WBC: 7.1 10*3/uL (ref 4.0–10.5)
nRBC: 0 % (ref 0.0–0.2)

## 2021-02-14 LAB — COMPREHENSIVE METABOLIC PANEL
ALT: 12 U/L (ref 0–44)
AST: 15 U/L (ref 15–41)
Albumin: 3.9 g/dL (ref 3.5–5.0)
Alkaline Phosphatase: 70 U/L (ref 38–126)
Anion gap: 8 (ref 5–15)
BUN: 23 mg/dL (ref 8–23)
CO2: 24 mmol/L (ref 22–32)
Calcium: 9 mg/dL (ref 8.9–10.3)
Chloride: 99 mmol/L (ref 98–111)
Creatinine, Ser: 0.96 mg/dL (ref 0.44–1.00)
GFR, Estimated: >60 mL/min (ref >60)
Glucose, Bld: 90 mg/dL (ref 70–99)
Potassium: 4.1 mmol/L (ref 3.5–5.1)
Sodium: 131 mmol/L — ABNORMAL LOW (ref 135–145)
Total Bilirubin: 0.6 mg/dL (ref 0.3–1.2)
Total Protein: 6.8 g/dL (ref 6.5–8.1)

## 2021-02-14 MED ORDER — SODIUM CHLORIDE 0.9 % IV SOLN
45.0000 mg/m2 | Freq: Once | INTRAVENOUS | Status: AC
  Administered 2021-02-14: 78 mg via INTRAVENOUS
  Filled 2021-02-14: qty 13

## 2021-02-14 MED ORDER — SODIUM CHLORIDE 0.9 % IV SOLN
146.4000 mg | Freq: Once | INTRAVENOUS | Status: AC
  Administered 2021-02-14: 150 mg via INTRAVENOUS
  Filled 2021-02-14: qty 15

## 2021-02-14 MED ORDER — MONTELUKAST SODIUM 10 MG PO TABS
10.0000 mg | ORAL_TABLET | Freq: Once | ORAL | Status: AC
  Administered 2021-02-14: 10 mg via ORAL
  Filled 2021-02-14: qty 1

## 2021-02-14 MED ORDER — DIPHENHYDRAMINE HCL 50 MG/ML IJ SOLN
50.0000 mg | Freq: Once | INTRAMUSCULAR | Status: AC
  Administered 2021-02-14: 50 mg via INTRAVENOUS
  Filled 2021-02-14: qty 1

## 2021-02-14 MED ORDER — FAMOTIDINE IN NACL 20-0.9 MG/50ML-% IV SOLN
20.0000 mg | Freq: Once | INTRAVENOUS | Status: AC
  Administered 2021-02-14: 20 mg via INTRAVENOUS
  Filled 2021-02-14: qty 50

## 2021-02-14 MED ORDER — SODIUM CHLORIDE 0.9 % IV SOLN
10.0000 mg | Freq: Once | INTRAVENOUS | Status: AC
  Administered 2021-02-14: 10 mg via INTRAVENOUS
  Filled 2021-02-14: qty 10

## 2021-02-14 MED ORDER — PALONOSETRON HCL INJECTION 0.25 MG/5ML
0.2500 mg | Freq: Once | INTRAVENOUS | Status: AC
  Administered 2021-02-14: 0.25 mg via INTRAVENOUS
  Filled 2021-02-14: qty 5

## 2021-02-14 MED ORDER — SODIUM CHLORIDE 0.9 % IV SOLN
Freq: Once | INTRAVENOUS | Status: AC
  Filled 2021-02-14: qty 250

## 2021-02-14 NOTE — Patient Instructions (Signed)
[facility] [place]  Discharge Instructions: Thank you for choosing [facility] to provide your oncology and hematology care.  If you have a lab appointment with the [facility], please go directly to the [facility] and check in at the registration area.  Wear comfortable clothing and clothing appropriate for easy access to any Portacath or PICC line.   We strive to give you quality time with your provider. You may need to reschedule your appointment if you arrive late (15 or more minutes).  Arriving late affects you and other patients whose appointments are after yours.  Also, if you miss three or more appointments without notifying the office, you may be dismissed from the clinic at the providers discretion.      For prescription refill requests, have your pharmacy contact our office and allow 72 hours for refills to be completed.    Today you received the following chemotherapy and/or immunotherapy agents : Taxol / Carboplatin  Paclitaxel injection What is this medication? PACLITAXEL (PAK li TAX el) is a chemotherapy drug. It targets fast dividing cells, like cancer cells, and causes these cells to die. This medicine is used to treat ovarian cancer, breast cancer, lung cancer, Kaposi's sarcoma, and other cancers. This medicine may be used for other purposes; ask your health care provider or pharmacist if you have questions. COMMON BRAND NAME(S): Onxol, Taxol What should I tell my care team before I take this medication? They need to know if you have any of these conditions: history of irregular heartbeat liver disease low blood counts, like low white cell, platelet, or red cell counts lung or breathing disease, like asthma tingling of the fingers or toes, or other nerve disorder an unusual or allergic reaction to paclitaxel, alcohol, polyoxyethylated castor oil, other chemotherapy, other medicines, foods, dyes, or preservatives pregnant or  trying to get pregnant breast-feeding How should I use this medication? This drug is given as an infusion into a vein. It is administered in a hospital or clinic by a specially trained health care professional. Talk to your pediatrician regarding the use of this medicine in children. Special care may be needed. Overdosage: If you think you have taken too much of this medicine contact a poison control center or emergency room at once. NOTE: This medicine is only for you. Do not share this medicine with others. What if I miss a dose? It is important not to miss your dose. Call your doctor or health care professional if you are unable to keep an appointment. What may interact with this medication? Do not take this medicine with any of the following medications: live virus vaccines This medicine may also interact with the following medications: antiviral medicines for hepatitis, HIV or AIDS certain antibiotics like erythromycin and clarithromycin certain medicines for fungal infections like ketoconazole and itraconazole certain medicines for seizures like carbamazepine, phenobarbital, phenytoin gemfibrozil nefazodone rifampin St. John's wort This list may not describe all possible interactions. Give your health care provider a list of all the medicines, herbs, non-prescription drugs, or dietary supplements you use. Also tell them if you smoke, drink alcohol, or use illegal drugs. Some items may interact with your medicine. What should I watch for while using this medication? Your condition will be monitored carefully while you are receiving this medicine. You will need important blood work done while you are taking this medicine. This medicine can cause serious allergic reactions. To reduce your risk you will need to take other medicine(s)  before treatment with this medicine. If you experience allergic reactions like skin rash, itching or hives, swelling of the face, lips, or tongue, tell your  doctor or health care professional right away. In some cases, you may be given additional medicines to help with side effects. Follow all directions for their use. This drug may make you feel generally unwell. This is not uncommon, as chemotherapy can affect healthy cells as well as cancer cells. Report any side effects. Continue your course of treatment even though you feel ill unless your doctor tells you to stop. Call your doctor or health care professional for advice if you get a fever, chills or sore throat, or other symptoms of a cold or flu. Do not treat yourself. This drug decreases your body's ability to fight infections. Try to avoid being around people who are sick. This medicine may increase your risk to bruise or bleed. Call your doctor or health care professional if you notice any unusual bleeding. Be careful brushing and flossing your teeth or using a toothpick because you may get an infection or bleed more easily. If you have any dental work done, tell your dentist you are receiving this medicine. Avoid taking products that contain aspirin, acetaminophen, ibuprofen, naproxen, or ketoprofen unless instructed by your doctor. These medicines may hide a fever. Do not become pregnant while taking this medicine. Women should inform their doctor if they wish to become pregnant or think they might be pregnant. There is a potential for serious side effects to an unborn child. Talk to your health care professional or pharmacist for more information. Do not breast-feed an infant while taking this medicine. Men are advised not to father a child while receiving this medicine. This product may contain alcohol. Ask your pharmacist or healthcare provider if this medicine contains alcohol. Be sure to tell all healthcare providers you are taking this medicine. Certain medicines, like metronidazole and disulfiram, can cause an unpleasant reaction when taken with alcohol. The reaction includes flushing,  headache, nausea, vomiting, sweating, and increased thirst. The reaction can last from 30 minutes to several hours. What side effects may I notice from receiving this medication? Side effects that you should report to your doctor or health care professional as soon as possible: allergic reactions like skin rash, itching or hives, swelling of the face, lips, or tongue breathing problems changes in vision fast, irregular heartbeat high or low blood pressure mouth sores pain, tingling, numbness in the hands or feet signs of decreased platelets or bleeding - bruising, pinpoint red spots on the skin, black, tarry stools, blood in the urine signs of decreased red blood cells - unusually weak or tired, feeling faint or lightheaded, falls signs of infection - fever or chills, cough, sore throat, pain or difficulty passing urine signs and symptoms of liver injury like dark yellow or brown urine; general ill feeling or flu-like symptoms; light-colored stools; loss of appetite; nausea; right upper belly pain; unusually weak or tired; yellowing of the eyes or skin swelling of the ankles, feet, hands unusually slow heartbeat Side effects that usually do not require medical attention (report to your doctor or health care professional if they continue or are bothersome): diarrhea hair loss loss of appetite muscle or joint pain nausea, vomiting pain, redness, or irritation at site where injected tiredness This list may not describe all possible side effects. Call your doctor for medical advice about side effects. You may report side effects to FDA at 1-800-FDA-1088. Where should I keep my  medication? This drug is given in a hospital or clinic and will not be stored at home. NOTE: This sheet is a summary. It may not cover all possible information. If you have questions about this medicine, talk to your doctor, pharmacist, or health care provider.  2022 Elsevier/Gold Standard (2020-10-05  00:00:00)      Carboplatin injection What is this medication? CARBOPLATIN (KAR boe pla tin) is a chemotherapy drug. It targets fast dividing cells, like cancer cells, and causes these cells to die. This medicine is used to treat ovarian cancer and many other cancers. This medicine may be used for other purposes; ask your health care provider or pharmacist if you have questions. COMMON BRAND NAME(S): Paraplatin What should I tell my care team before I take this medication? They need to know if you have any of these conditions: blood disorders hearing problems kidney disease recent or ongoing radiation therapy an unusual or allergic reaction to carboplatin, cisplatin, other chemotherapy, other medicines, foods, dyes, or preservatives pregnant or trying to get pregnant breast-feeding How should I use this medication? This drug is usually given as an infusion into a vein. It is administered in a hospital or clinic by a specially trained health care professional. Talk to your pediatrician regarding the use of this medicine in children. Special care may be needed. Overdosage: If you think you have taken too much of this medicine contact a poison control center or emergency room at once. NOTE: This medicine is only for you. Do not share this medicine with others. What if I miss a dose? It is important not to miss a dose. Call your doctor or health care professional if you are unable to keep an appointment. What may interact with this medication? medicines for seizures medicines to increase blood counts like filgrastim, pegfilgrastim, sargramostim some antibiotics like amikacin, gentamicin, neomycin, streptomycin, tobramycin vaccines Talk to your doctor or health care professional before taking any of these medicines: acetaminophen aspirin ibuprofen ketoprofen naproxen This list may not describe all possible interactions. Give your health care provider a list of all the medicines,  herbs, non-prescription drugs, or dietary supplements you use. Also tell them if you smoke, drink alcohol, or use illegal drugs. Some items may interact with your medicine. What should I watch for while using this medication? Your condition will be monitored carefully while you are receiving this medicine. You will need important blood work done while you are taking this medicine. This drug may make you feel generally unwell. This is not uncommon, as chemotherapy can affect healthy cells as well as cancer cells. Report any side effects. Continue your course of treatment even though you feel ill unless your doctor tells you to stop. In some cases, you may be given additional medicines to help with side effects. Follow all directions for their use. Call your doctor or health care professional for advice if you get a fever, chills or sore throat, or other symptoms of a cold or flu. Do not treat yourself. This drug decreases your body's ability to fight infections. Try to avoid being around people who are sick. This medicine may increase your risk to bruise or bleed. Call your doctor or health care professional if you notice any unusual bleeding. Be careful brushing and flossing your teeth or using a toothpick because you may get an infection or bleed more easily. If you have any dental work done, tell your dentist you are receiving this medicine. Avoid taking products that contain aspirin, acetaminophen, ibuprofen, naproxen,  or ketoprofen unless instructed by your doctor. These medicines may hide a fever. Do not become pregnant while taking this medicine. Women should inform their doctor if they wish to become pregnant or think they might be pregnant. There is a potential for serious side effects to an unborn child. Talk to your health care professional or pharmacist for more information. Do not breast-feed an infant while taking this medicine. What side effects may I notice from receiving this  medication? Side effects that you should report to your doctor or health care professional as soon as possible: allergic reactions like skin rash, itching or hives, swelling of the face, lips, or tongue signs of infection - fever or chills, cough, sore throat, pain or difficulty passing urine signs of decreased platelets or bleeding - bruising, pinpoint red spots on the skin, black, tarry stools, nosebleeds signs of decreased red blood cells - unusually weak or tired, fainting spells, lightheadedness breathing problems changes in hearing changes in vision chest pain high blood pressure low blood counts - This drug may decrease the number of white blood cells, red blood cells and platelets. You may be at increased risk for infections and bleeding. nausea and vomiting pain, swelling, redness or irritation at the injection site pain, tingling, numbness in the hands or feet problems with balance, talking, walking trouble passing urine or change in the amount of urine Side effects that usually do not require medical attention (report to your doctor or health care professional if they continue or are bothersome): hair loss loss of appetite metallic taste in the mouth or changes in taste This list may not describe all possible side effects. Call your doctor for medical advice about side effects. You may report side effects to FDA at 1-800-FDA-1088. Where should I keep my medication? This drug is given in a hospital or clinic and will not be stored at home. NOTE: This sheet is a summary. It may not cover all possible information. If you have questions about this medicine, talk to your doctor, pharmacist, or health care provider.  2022 Elsevier/Gold Standard (2007-06-26 00:00:00)       To help prevent nausea and vomiting after your treatment, we encourage you to take your nausea medication as directed.  BELOW ARE SYMPTOMS THAT SHOULD BE REPORTED IMMEDIATELY: *FEVER GREATER THAN 100.4 F (38  C) OR HIGHER *CHILLS OR SWEATING *NAUSEA AND VOMITING THAT IS NOT CONTROLLED WITH YOUR NAUSEA MEDICATION *UNUSUAL SHORTNESS OF BREATH *UNUSUAL BRUISING OR BLEEDING *URINARY PROBLEMS (pain or burning when urinating, or frequent urination) *BOWEL PROBLEMS (unusual diarrhea, constipation, pain near the anus) TENDERNESS IN MOUTH AND THROAT WITH OR WITHOUT PRESENCE OF ULCERS (sore throat, sores in mouth, or a toothache) UNUSUAL RASH, SWELLING OR PAIN  UNUSUAL VAGINAL DISCHARGE OR ITCHING   Items with * indicate a potential emergency and should be followed up as soon as possible or go to the Emergency Department if any problems should occur.  Please show the CHEMOTHERAPY ALERT CARD or IMMUNOTHERAPY ALERT CARD at check-in to the Emergency Department and triage nurse.  Should you have questions after your visit or need to cancel or reschedule your appointment, please contact [facility] [place] [facility] [place]  [phone] and follow the prompts.  Office hours are 8:00 a.m. to 4:30 p.m. Monday - Friday. Please note that voicemails left after 4:00 p.m. may not be returned until the following business day.  We are closed weekends and major holidays. You have access to a nurse at all times for urgent  questions. Please call the main number to the clinic [phone] and follow the prompts.  For any non-urgent questions, you may also contact your provider using MyChart. We now offer e-Visits for anyone 66 and older to request care online for non-urgent symptoms. For details visit [email or web address].   Also download the MyChart app! Go to the app store, search "MyChart", open the app, select [facility], and log in with your MyChart username and password.  Due to Covid, a mask is required upon entering the hospital/clinic. If you do not have a mask, one will be given to you upon arrival. For doctor visits, patients may have 1 support person aged 57 or older with them. For treatment visits,  patients cannot have anyone with them due to current Covid guidelines and our immunocompromised population.

## 2021-02-14 NOTE — Progress Notes (Signed)
Pt states that she did not take any of her meds this am. 1128- BP elevated 180/72. 1140 BP was 187/81. Beckey Rutter NP aware. Per NP- hold Taxol for 15 mins and recheck BP. Pt is asymptomatic. Taxol stopped. BP at 1202 was 177/70. Per Beckey Rutter- dc Taxol for today. Ok to proceed with [facility]. Completed without incident . BP recheck improved.  Advised pt to take BP meds as directed and on day of treatment. Pt verbalized understanding.

## 2021-02-14 NOTE — Progress Notes (Signed)
Hematology/Oncology note [phone] [phone]   Patient Care Team: [name], DO as PCP - General (Family Medicine) Clent Jacks, RN as Oncology Nurse Navigator Noreene Filbert, MD as Radiation Oncologist (Radiation Oncology) Telford Nab, RN as Oncology Nurse Navigator  REFERRING PROVIDER: [name], DO   CHIEF COMPLAINTS/REASON FOR VISIT:  Evaluation and consideration of chemotherapy for recurrent lung cancer.  Continued surveillance of cervical cancer.  HISTORY OF PRESENTING ILLNESS:  Mary Hoover is a  76 y.o.  female with PMH listed below was seen in consultation at the request of  [name], DO  for evaluation of cervical cancer Patient has developed postmenopausal vaginal bleeding and discharge.  She was noted to have a friable cervix/2 cm mass of the cervix, concerning for malignancy 12/19/2018 endometrial biopsy showed scattered atypical squamous cells suspicious for malignancy.  Predominantly necrosis with associated inflammation. 01/01/2019 initial cervix biopsy showed at least high-grade squamous intraepithelial lesion.-HPV negative 01/22/2019, repeat vaginal wall biopsy and cervix 9:00 biopsy showed squamous cell carcinoma.    Staging images 12/31/2018 showed fluid distending the endometrial canal and or endometrial thickening to the level of cervix.  No evidence of pathological lymphadenopathy.  Haziness about the lower cervix.  7 mm irregular pulmonary nodule within the right upper lobe, suspicious for possible primary or metastatic malignancy.  Chronic pleural-parenchymal scarring/fibrosis at the lung apices.  Large amount of stool.  No bowel obstruction. 01/08/2019 PET scan showed marked hypermetabolism in the region of the cervix, compatible with the reported history of cervical cancer. Small bilateral pelvic sidewall lymph nodes discernible FDG accumulation, concerning for metastatic disease although neither lymph node  is enlarged by CT size criteria. 7 mm nodule in the right upper lobe shows discernible FDG accumulation.  Questionable neoplasm, primary versus metastatic. Emphysema.   # Chronic hearing loss # Lung nodule, FDG avid, questionable primary bronchogenic carcinoma versus metastatic cancer. Discussed with radiation oncology.  Her case was also discussed on tumor board.  Consensus reached on finishing concurrent chemoradiation treatments for cervix cancer first. Possible SBRT to lung nodule for presumed primary lung cancer.  # 02/20/2020 - 03/19/2020 concurrent chemotherapy weekly carboplatin and taxol and radiation for treatment of this cervix cancer.  #December 2021 lung nodule,was evaluated by Dr.Oaks. Options of biopsy via transthoracic or endobronchial approach vs surgical resection.  Patient opted to empiric SBRT  Patient was last seen by me on 06/25/2019.  Lost follow-up. She continues to follow with radiation oncology. 11/20/2020, CT chest with contrast showed minimal residual of previously seen right upper lobe nodule.  0.6 x 0.4 cm.  Internal development of mild bandlike radiation fibrosis.  Newly enlarged pretracheal lymph node, measuring 2.5 x 1.7 cm.  Highly concerning for metastatic disease.  Unchanged prominent AP window and the right hilar lymph nodes.  Emphysema and diffuse bilateral bronchial wall thickening.  Coronary artery disease. 12/13/2020, PET scan showed enlarging hypermetabolic right lower paratracheal lymph node, compatible with metastatic disease.  SUV 10.3.  Mild FDG uptake of the bilateral sacral alla with associated lucency.  Concerning for sacral insufficiency fracture.  Stable posttreatment changes of the right upper lobe nodule.  Nonspecific small solid 3 mm pulmonary nodule of the left lower lobe.  2 small to be characterized.  No evidence of FDG avid metastatic disease in the abdomen or pelvis.  Aortic atherosclerosis and emphysema.  Patient reestablish care on  12/27/2020.  #01/12/2021 patient underwent bronchoscopy biopsy by Dr. Patsey Berthold Right paratracheal lymph node fine-needle aspiration is  positive for metastatic non-small cell carcinoma, favor adenocarcinoma of lung origin.  INTERVAL HISTORY Mary Hoover is a 76 y.o. female with above history who returns to clinic for consideration of carbo-Taxol chemotherapy.  She continues radiation with Dr. Baruch Gouty.  Chemotherapy was previously held due to hypotension.  Tolerating radiation well.  She has not taken her blood pressure medicine this morning.  She says that mornings get very busy due to dropping off and picking up her grandson from school.   Review of Systems  Constitutional:  Negative for appetite change, chills, fatigue and fever.  HENT:   Positive for hearing loss. Negative for voice change.   Eyes:  Negative for eye problems.  Respiratory:  Negative for chest tightness, cough, hemoptysis, shortness of breath and wheezing.   Cardiovascular:  Negative for chest pain.  Gastrointestinal:  Negative for abdominal distention, abdominal pain and blood in stool.  Endocrine: Negative for hot flashes.  Genitourinary:  Negative for difficulty urinating and frequency.   Musculoskeletal:  Negative for arthralgias.  Skin:  Negative for itching and rash.  Neurological:  Negative for extremity weakness.  Hematological:  Negative for adenopathy.  Psychiatric/Behavioral:  Negative for confusion and decreased concentration. The patient is not nervous/anxious.    MEDICAL HISTORY:  Past Medical History:  Diagnosis Date   Arthritis    Cancer ([place])    Carotid atherosclerosis    Carotid bruit    Cervical cancer (HCC)    COPD (chronic obstructive pulmonary disease) (HCC)    emphysema   Coronary artery disease    Family history of adverse reaction to anesthesia    paternal grandfather died during surgery-pt unaware what happened   GERD (gastroesophageal reflux disease)    Hyperlipidemia     Hypertension    Hyponatremia 02/27/2019   Hypothyroidism    Infected cat bite 1980s   was hospitalized   Irregular heartbeat    Medical history non-contributory    Peripheral vascular disease ([place])    Tobacco use     SURGICAL HISTORY: Past Surgical History:  Procedure Laterality Date   CAROTID ENDARTERECTOMY Right Oct. 2015   Dr. Lucky Cowboy   CAROTID STENOSIS Left April 2016   carotid stenosis surgery   COLONOSCOPY WITH PROPOFOL N/A 11/08/2016   Procedure: COLONOSCOPY WITH PROPOFOL;  Surgeon: Jonathon Bellows, MD;  Location: [facility] ENDOSCOPY;  Service: Gastroenterology;  Laterality: N/A;   ESOPHAGOGASTRODUODENOSCOPY (EGD) WITH PROPOFOL N/A 11/08/2016   Procedure: ESOPHAGOGASTRODUODENOSCOPY (EGD) WITH PROPOFOL;  Surgeon: Jonathon Bellows, MD;  Location: [facility] ENDOSCOPY;  Service: Gastroenterology;  Laterality: N/A;   FLEXIBLE BRONCHOSCOPY N/A 12/17/2014   Procedure: FLEXIBLE BRONCHOSCOPY;  Surgeon: Wilhelmina Mcardle, MD;  Location: [facility] ORS;  Service: Pulmonary;  Laterality: N/A;   INCISION AND DRAINAGE / EXCISION THYROGLOSSAL CYST  April 2011   VIDEO BRONCHOSCOPY WITH ENDOBRONCHIAL ULTRASOUND N/A 01/12/2021   Procedure: VIDEO BRONCHOSCOPY WITH ENDOBRONCHIAL ULTRASOUND;  Surgeon: Tyler Pita, MD;  Location: [facility] ORS;  Service: Cardiopulmonary;  Laterality: N/A;    SOCIAL HISTORY: Social History   Socioeconomic History   Marital status: Married    Spouse name: Not on file   Number of children: Not on file   Years of education: Not on file   Highest education level: 9th grade  Occupational History   Occupation: retired   Tobacco Use   Smoking status: Every Day    Packs/day: 1.75    Years: 50.00    Pack years: 87.50    Types: Cigarettes   Smokeless tobacco: Never  Tobacco comments:    0.5 PPD 02/10/2021  Vaping Use   Vaping Use: Former  Substance and Sexual Activity   Alcohol use: No    Alcohol/week: 0.0 standard drinks   Drug use: No   Sexual activity: Yes  Other Topics Concern    Not on file  Social History Narrative   Lives at home with husband   Social Determinants of Health   Financial Resource Strain: Not on file  Food Insecurity: Not on file  Transportation Needs: Not on file  Physical Activity: Not on file  Stress: Not on file  Social Connections: Not on file  Intimate Partner Violence: Not on file    FAMILY HISTORY: Family History  Problem Relation Age of Onset   Congestive Heart Failure Mother    Heart disease Mother    Hypertension Mother    Hypertension Sister    Diabetes Sister    Heart disease Sister    Heart disease Maternal Uncle    Heart disease Maternal Grandmother    Stroke Maternal Grandfather    Cancer Brother        liver, lung   Heart disease Brother    Hypertension Brother    Heart attack Brother    COPD Neg Hx    Breast cancer Neg Hx     ALLERGIES:  is allergic to atorvastatin.  MEDICATIONS:  Current Outpatient Medications  Medication Sig Dispense Refill   acetaminophen (TYLENOL) 500 MG tablet Take 500 mg by mouth every 6 (six) hours as needed for moderate pain.     alendronate (FOSAMAX) 70 MG tablet Take 1 tablet (70 mg total) by mouth every 7 (seven) days. Take with a full glass of water on an empty stomach. 12 tablet 3   clopidogrel (PLAVIX) 75 MG tablet Take 1 tablet (75 mg total) by mouth daily. 90 tablet 3   cyclobenzaprine (FLEXERIL) 10 MG tablet TAKE 1 TABLET AT BEDTIME 60 tablet 3   dexamethasone (DECADRON) 4 MG tablet Take 2 tablets (8 mg total) by mouth daily. Take for 2 days after chemotherapy. . 30 tablet 1   ferrous sulfate 325 (65 FE) MG EC tablet Take 1 tablet (325 mg total) by mouth daily with breakfast. 90 tablet 4   Fluticasone-Umeclidin-Vilant (TRELEGY ELLIPTA) 100-62.5-25 MCG/INH AEPB Inhale 1 puff into the lungs daily. 1 each 11   gabapentin (NEURONTIN) 100 MG capsule Take 1 capsule (100 mg total) by mouth 3 (three) times daily. 270 capsule 1   levothyroxine (SYNTHROID) 75 MCG tablet Take 1  tablet (75 mcg total) by mouth daily. 90 tablet 3   lisinopril (ZESTRIL) 20 MG tablet Take 1 tablet (20 mg total) by mouth daily. (Patient taking differently: Take 20 mg by mouth every evening.) 90 tablet 1   montelukast (SINGULAIR) 10 MG tablet Take 1 tablet (10 mg total) by mouth See admin instructions. Take 64m on the day before chemotherapy 30 tablet 0   Multiple Vitamin (MULTIVITAMIN) tablet Take 1 tablet by mouth daily.     naproxen (NAPROSYN) 500 MG tablet TAKE 1 TABLET TWICE DAILY WITH MEALS 180 tablet 1   omeprazole (PRILOSEC) 20 MG capsule Take 1 capsule (20 mg total) by mouth daily. (Patient taking differently: Take 20 mg by mouth daily before lunch.) 90 capsule 1   ondansetron (ZOFRAN) 8 MG tablet Take 1 tablet (8 mg total) by mouth 2 (two) times daily as needed for refractory nausea / vomiting. Start on day 3 after chemo. 30 tablet 1   vitamin  B-12 (CYANOCOBALAMIN) 500 MCG tablet Take 500 mcg by mouth daily.     No current facility-administered medications for this visit.     PHYSICAL EXAMINATION: ECOG PERFORMANCE STATUS: 1 - Symptomatic but completely ambulatory Vitals:   02/14/21 0908  BP: (!) 156/81  Pulse: 83  Resp: 16  Temp: (!) 96.9 F (36.1 C)  SpO2: 98%   Filed Weights   02/14/21 0908  Weight: 135 lb 1.6 oz (61.3 kg)    Physical Exam Constitutional:      General: She is not in acute distress. HENT:     Head: Normocephalic and atraumatic.  Eyes:     General: No scleral icterus.    Pupils: Pupils are equal, round, and reactive to light.  Cardiovascular:     Rate and Rhythm: Normal rate and regular rhythm.     Heart sounds: Normal heart sounds.  Pulmonary:     Effort: Pulmonary effort is normal. No respiratory distress.     Breath sounds: No wheezing.     Comments: Decreased breath sound bilaterally  Abdominal:     General: Bowel sounds are normal. There is no distension.     Palpations: Abdomen is soft. There is no mass.     Tenderness: There is no  abdominal tenderness.  Musculoskeletal:        General: No deformity. Normal range of motion.     Cervical back: Normal range of motion and neck supple.  Skin:    General: Skin is warm and dry.     Findings: No erythema or rash.  Neurological:     Mental Status: She is alert and oriented to person, place, and time. Mental status is at baseline.     Cranial Nerves: No cranial nerve deficit.     Coordination: Coordination normal.  Psychiatric:        Mood and Affect: Mood normal.    LABORATORY DATA:  I have reviewed the data as listed Lab Results  Component Value Date   WBC 7.1 02/14/2021   HGB 11.9 (L) 02/14/2021   HCT 35.3 (L) 02/14/2021   MCV 96.2 02/14/2021   PLT 286 02/14/2021   Recent Labs    11/09/20 1442 01/04/21 1452 02/09/21 0900 02/14/21 0831  NA 130* 132* 132* 131*  K 4.3 4.5 4.4 4.1  CL 95* 96 101 99  CO2 19* _0 GLUCOSE 83 74 78 90  BUN _1 CREATININE 1.01* 0.97 1.09* 0.96  CALCIUM 9.1 9.3 8.9 9.0  GFRNONAA  --   --  54* >60  PROT 6.3  --  6.7 6.8  ALBUMIN 4.2  --  3.8 3.9  AST 17  --  17 15  ALT 9  --  13 12  ALKPHOS 74  --  64 70  BILITOT 0.2  --  0.6 0.6    Iron/TIBC/Ferritin/ %Sat    Component Value Date/Time   IRON 55 10/31/2016 1546   TIBC 339 10/31/2016 1546   FERRITIN 18 10/31/2016 1546   IRONPCTSAT 16 10/31/2016 1546      RADIOGRAPHIC STUDIES: I have personally reviewed the radiological images as listed and agreed with the findings in the report. CT Chest W Contrast  Result Date: 11/20/2020 CLINICAL DATA:  Follow-up non-small cell lung cancer, status post radiation therapy to a presumed malignancy of the right upper lobe EXAM: CT CHEST WITH CONTRAST TECHNIQUE: Multidetector CT imaging of the chest was performed during intravenous contrast administration. CONTRAST:  50m OMNIPAQUE IOHEXOL  300 MG/ML  SOLN COMPARISON:  05/17/2020, PET-CT, 11/27/2019 FINDINGS: Cardiovascular: Aortic atherosclerosis. Normal heart size.  Three-vessel coronary artery calcifications. No pericardial effusion. Mediastinum/Nodes: Newly enlarged pretracheal lymph node, measuring 2.5 x 1.7 cm (series 2, image 49). Unchanged prominent AP window node measuring up to 1.5 x 1.0 cm (series 2, image 55) and right hilar node measuring 1.4 x 1.0 cm (series 2, image 62). Thyroid gland, trachea, and esophagus demonstrate no significant findings. Lungs/Pleura: Moderate centrilobular emphysema. Diffuse bilateral bronchial wall thickening. There is a minimal residual of a previously seen right upper lobe nodule, measuring no greater than 0.6 x 0.4 cm, previously 0.8 x 0.6 cm (series 3, image 46). Interval development of mild, bandlike radiation fibrosis in this vicinity. No pleural effusion or pneumothorax. Upper Abdomen: No acute abnormality. Musculoskeletal: No chest wall mass or suspicious bone lesions identified. IMPRESSION: 1. There is a minimal residual of a previously seen right upper lobe nodule, measuring no greater than 0.6 x 0.4 cm, previously 0.8 x 0.6 cm. Interval development of mild, bandlike radiation fibrosis in this vicinity. Findings are consistent with treatment response to radiation therapy. 2. Newly enlarged pretracheal lymph node, measuring 2.5 x 1.7 cm, highly concerning for metastatic disease. 3. Unchanged prominent AP window and right hilar lymph nodes, which remain more nonspecific, previously with equivocal FDG avidity by PET. 4. Emphysema and diffuse bilateral bronchial wall thickening. 5. Coronary artery disease. Aortic Atherosclerosis (ICD10-I70.0). Electronically Signed   By: Delanna Ahmadi M.D.   On: 11/20/2020 10:07   MR Brain W Wo Contrast  Result Date: 02/01/2021 CLINICAL DATA:  Malignant neoplasm of overlapping sites of cervix ([place]) C53.8 (ICD-10-CM). Lung cancer follow up. EXAM: MRI HEAD WITHOUT AND WITH CONTRAST TECHNIQUE: Multiplanar, multiecho pulse sequences of the brain and surrounding structures were obtained without and  with intravenous contrast. CONTRAST:  61m GADAVIST GADOBUTROL 1 MMOL/ML IV SOLN COMPARISON:  MRI of the brain August 18, 2004. FINDINGS: Brain: No acute infarction, hemorrhage, hydrocephalus, extra-axial collection or mass lesion. Scattered foci of T2 hyperintensity are seen within the white matter of cerebral hemispheres, most likely small vessel ischemia, mildly progressed since prior MRI. A few punctate foci of susceptibility artifact are seen scattered in the right cerebral hemisphere, may represent hemosiderin deposits. No focus of abnormal contrast enhancement to suggest intracranial metastatic disease. Vascular: Normal flow voids. Skull and upper cervical spine: Normal marrow signal. Sinuses/Orbits: Negative. Other: Trace mucosal thickening in the bilateral mastoid cells. Epidermal inclusion cyst measuring 1.4 cm posteriorly in the neck subcutaneous. IMPRESSION: 1. No evidence of intracranial metastatic disease. 2. Mild-to-moderate chronic microvascular ischemic changes of the white. Electronically Signed   By: KPedro EarlsM.D.   On: 02/01/2021 13:53   MR Lumbar Spine Wo Contrast  Result Date: 01/21/2021 CLINICAL DATA:  Low back pain, increased fracture risk ?sacral insuffiency fracture on PET scan EXAM: MRI LUMBAR SPINE WITHOUT CONTRAST TECHNIQUE: Multiplanar, multisequence MR imaging of the lumbar spine was performed. No intravenous contrast was administered. COMPARISON:  PET-CT 12/13/2020, x-ray 05/20/2020 FINDINGS: Segmentation: Transitional lumbosacral anatomy with partial sacralization of the L5 segment. Lowest rib-bearing segment is labeled as T12 (axial series 8, image 6). Alignment:  Trace retrolisthesis of L4 on L5. Vertebrae: Mild superior endplate compression fracture of L5 with approximately 15% vertebral body height loss. Mild bone marrow edema within the L5 vertebral body. There is extensive bone marrow edema within the bilateral sacral ala associated with bilateral sacral  fractures, likely subacute given findings on previous PET-CT. Remaining vertebral  bodies of the lumbar spine are intact with preserved height. No additional fracture. No evidence of discitis. No suspicious marrow replacing bone lesion. Conus medullaris and cauda equina: Conus extends to the L1 level. Conus and cauda equina appear normal. Paraspinal and other soft tissues: Negative. Disc levels: T12-L1: Minimal disc bulge. Mild bilateral facet arthropathy. No foraminal or canal stenosis. L1-L2: Minimal disc bulge with small left foraminal protrusion. Mild bilateral facet hypertrophy. No foraminal or canal stenosis. L2-L3: Mild annular disc bulge. Mild bilateral facet arthropathy with ligamentum flavum buckling. Findings result in mild canal stenosis. No significant foraminal stenosis. L3-L4: Mild annular disc bulge. Mild bilateral facet arthropathy with ligamentum flavum buckling. Findings result in mild canal stenosis. No significant foraminal stenosis. L4-L5: Retrolisthesis with disc bulge and endplate ridging. Mild-moderate bilateral facet arthropathy with ligamentum flavum buckling. Mild bilateral subarticular recess stenosis without canal stenosis. No significant foraminal stenosis. L5-S1: Transitional level.  No evidence of impingement. IMPRESSION: 1. Transitional lumbosacral anatomy, as above. 2. Subacute-appearing mild superior endplate compression fracture of L5 with approximately 15% vertebral body height loss. 3. Extensive bone marrow edema within the bilateral sacral ala associated with bilateral sacral fractures, likely subacute given findings on previous PET-CT. 4. Mild multilevel degenerative changes of the lumbar spine as described. 5. Mild canal stenosis at L2-3 and L3-4. 6. Mild bilateral subarticular recess stenosis at L4-5. Electronically Signed   By: Davina Poke D.O.   On: 01/21/2021 09:10   NM PET Image Restag (PS) Skull Base To Thigh  Result Date: 12/14/2020 CLINICAL DATA:  Follow-up  treatment strategy for non-small cell lung cancer. EXAM: NUCLEAR MEDICINE PET SKULL BASE TO THIGH TECHNIQUE: 7.0 mCi F-18 FDG was injected intravenously. Full-ring PET imaging was performed from the skull base to thigh after the radiotracer. CT data was obtained and used for attenuation correction and anatomic localization. Fasting blood glucose: 79 mg/dl COMPARISON:  Chest CT dated November 18, 2020 FINDINGS: Mediastinal blood pool activity: SUV max 2.1 Liver activity: SUV max 2.8 NECK: No hypermetabolic lymph nodes in the neck. Incidental CT findings: none CHEST: Enlarged and hypermetabolic right lower paratracheal lymph node measuring 1.4 cm in short axis on series 3, image 85 with an SUV max of 10.3, unchanged in size when compared with prior in remeasured in similar plane. Unchanged prominent AP window and right hilar lymph nodes which demonstrate no hypermetabolic activity. No additional hypermetabolic lymph nodes seen in the chest. Right upper lobe ground-glass opacities with mild FDG uptake below mediastinal blood pool, likely post radiation change. Tiny solid right upper lobe pulmonary nodule measuring approximately 5 mm on image 81. Pulmonary nodule of the left lower lobe measuring 3 mm on series 3, image 117, unchanged compared to prior, but new when compared with more remote prior exams. Incidental CT findings: Centrilobular emphysema. Three-vessel coronary artery calcifications. Atherosclerotic disease of the thoracic aorta. ABDOMEN/PELVIS: No abnormal hypermetabolic activity within the liver, pancreas, adrenal glands, or spleen. No hypermetabolic lymph nodes in the abdomen or pelvis. Incidental CT findings: Atherosclerotic disease of the thoracic aorta. Calcifications of the liver, likely sequela prior granulomatous infection. SKELETON: Mild diffuse FDG uptake of the bilateral sacral ala with associated lucency, SUV max of 2.6. Otherwise no focal FDG uptake. Incidental CT findings: Unchanged small  sclerotic lesions of the left iliac bone, likely bone islands. IMPRESSION: 1. Enlarged and hypermetabolic right lower paratracheal lymph node, compatible with metastatic disease. 2. Mild FDG uptake of the bilateral sacral ala with associated lucency. Concerning for sacral insufficiency fractures. This could  be further evaluated with MRI of the pelvis. 3. Stable posttreatment changes of the right upper lobe. 4. Nonspecific small solid 3 mm pulmonary nodule of the left lower lobe, too small to assess for FDG avidity. Nodules unchanged compared to most recent prior, but new when compared with more remote priors. Recommend attention on follow-up. 5. No evidence of FDG avid metastatic disease in the abdomen or pelvis. 6. Aortic Atherosclerosis (ICD10-I70.0) and Emphysema (ICD10-J43.9). These results will be called to the ordering clinician or representative by the Radiologist Assistant, and communication documented in the PACS or [employer]. Electronically Signed   By: Yetta Glassman M.D.   On: 12/14/2020 10:58   DG Bone Density  Result Date: 01/25/2021 EXAM: DUAL X-RAY ABSORPTIOMETRY (DXA) FOR BONE MINERAL DENSITY IMPRESSION: crr Your patient Mary Hoover completed a BMD test on 01/25/2021 using the [place] (analysis version: 14.10) manufactured by [employer]. The following summarizes the results of our evaluation. PATIENT BIOGRAPHICAL: Name: Mary Hoover, Mary Hoover Patient ID: [id number] Birth Date: November 16, 1945 Height: 67.2 in. Gender: Female Exam Date: 01/25/2021 Weight: 132.2 lbs. Indications: Advanced Age, arthritis, Caucasian, copd, emphysema, Height Loss, History of Fracture (Adult), hx cervical ca, hypothyroid, Low Body Weight, lung ca, POSTmenopausal, Tobacco User Fractures: coccyx Treatments: GABAPENTIN, levothyroxine, multivitamin, plavix, trelegy ASSESSMENT: The BMD measured at Femur Total Right is 0.522 g/cm2 with a T-score of -3.9. This patient is considered osteoporotic  according to [facility]) criteria. Lumbar spine was not utilized due to advanced degenerative changes.The scan quality is limited by exclusion of L-spine. Site Region Measured Measured WHO Young Adult BMD Date       Age      Classification T-score DualFemur Total Right 01/25/[age] Osteoporosis -3.9 0.522 g/cm2 Left Forearm Radius 33% 01/25/2021 73.8 Osteoporosis -3.7 0.556 g/cm2 World Health Organization [facility]) criteria for post-menopausal, Caucasian Women: Normal:       T-score at or above -1 SD Osteopenia:   T-score between -1 and -2.5 SD Osteoporosis: T-score at or below -2.5 SD RECOMMENDATIONS: 1. All patients should optimize calcium and vitamin D intake. 2. Consider FDA-approved medical therapies in postmenopausal women and men aged 34 years and older, based on the following: a. A hip or vertebral (clinical or morphometric) fracture b. T-score < -2.5 at the femoral neck or spine after appropriate evaluation to exclude secondary causes c. Low bone mass (T-score between -1.0 and -2.5 at the femoral neck or spine) and a 10-year probability of a hip fracture > 3% or a 10-year probability of a major osteoporosis-related fracture > 20% based on the [place]-adapted WHO algorithm d. Clinician judgment and/or patient preferences may indicate treatment for people with 10-year fracture probabilities above or below these levels FOLLOW-UP: People with diagnosed cases of osteoporosis or at high risk for fracture should have regular bone mineral density tests. For patients eligible for Medicare, routine testing is allowed once every 2 years. The testing frequency can be increased to one year for patients who have rapidly progressing disease, those who are receiving or discontinuing medical therapy to restore bone mass, or have additional risk factors. I have reviewed this report, and agree with the above findings. [facility] Electronically Signed   By: Franki Cabot M.D.   On: 01/25/2021 15:11    MM 3D SCREEN BREAST BILATERAL  Result Date: 01/26/2021 CLINICAL DATA:  Screening. EXAM: DIGITAL SCREENING BILATERAL MAMMOGRAM WITH TOMOSYNTHESIS AND CAD TECHNIQUE: Bilateral screening digital craniocaudal and mediolateral oblique mammograms were obtained. Bilateral screening digital breast  tomosynthesis was performed. The images were evaluated with computer-aided detection. COMPARISON:  None. ACR Breast Density Category c: The breast tissue is heterogeneously dense, which may obscure small masses. FINDINGS: In the right breast, calcifications warrant further evaluation with magnified views. In the left breast, no findings suspicious for malignancy. IMPRESSION: Further evaluation is suggested for calcifications in the right breast. RECOMMENDATION: Diagnostic mammogram of the right breast. (Code:[id number]) The patient will be contacted regarding the findings, and additional imaging will be scheduled. BI-RADS CATEGORY  0: Incomplete. Need additional imaging evaluation and/or prior mammograms for comparison. Electronically Signed   By: Nolon Nations M.D.   On: 01/26/2021 07:35      ASSESSMENT & PLAN:  No diagnosis found.  Cancer Staging  Cervical cancer ([place]) Staging form: Cervix Uteri, AJCC Version 9 - Clinical: FIGO Stage IIICr (cTX, cN1) - Unsigned  Primary lung adenocarcinoma ([place]) Staging form: Lung, AJCC 7th Edition - Clinical stage from 01/12/2021: T1, N1, M0 - Signed by [name], MD on 01/29/2021   #Stage III lung adenocarcinoma, recurrent. Plan is for concurrent chemotherapy with carbo-taxol and radiation.  Labs today reviewed and acceptable for initiation of cycle 1 day 1 of carbo-Taxol chemotherapy.  Chemo was previously held due to hypotension.  Today pressures improved.    #Hypertension- anxiety contributing. Clinically, asymptomatic. , likely secondary to anxiety.  Patient denies any focal deficits, headache. On lisinopril which she takes at night. Requested she take in the  morning the day of chemotherapy. She may need to see PCP to start second agent or maximize lisinopril.   # Locally advanced cervical cancer stage IIIC1 with pelvic nodes involvemen- S/p concurrent chemotherapy and radiation followed by brachytherapy. She is agreeable to follow up appt and pelvic exam with gyn onc. Will get scheduled. No vaginal bleeding or pain today.  # Port-a-cath- patient would like port. Will get scheduled/referred.   All questions were answered. The patient knows to call the clinic with any problems questions or concerns.  Return of visit:  02/22/2020- lab (CBC, CMP), Dr Tasia Catchings, carbo-taxol   Beckey Rutter, [place], [place] at [facility] [phone] (clinic) 02/14/2021  Addendum: patient's blood pressure rose during taxol administration. Peaked at maximum infusion rate. Infusion held. Pressure normalized. Taxol was not completed. Ok to proceed with [place] as her pressure at normalized. Tolerated well without complication. I've asked her to take lisinopril in the morning the day of her treatment.   CC: Dr. Wynetta Emery

## 2021-02-15 ENCOUNTER — Ambulatory Visit
Admission: RE | Admit: 2021-02-15 | Discharge: 2021-02-15 | Disposition: A | Discharge status: Home / Self Care | Payer: Medicare PPO | Source: Ambulatory Visit | Attending: Radiation Oncology | Admitting: Radiation Oncology

## 2021-02-15 DIAGNOSIS — C3491 Malignant neoplasm of unspecified part of right bronchus or lung: Secondary | ICD-10-CM | POA: Diagnosis not present

## 2021-02-15 DIAGNOSIS — Z923 Personal history of irradiation: Secondary | ICD-10-CM | POA: Diagnosis not present

## 2021-02-15 DIAGNOSIS — C770 Secondary and unspecified malignant neoplasm of lymph nodes of head, face and neck: Secondary | ICD-10-CM | POA: Diagnosis not present

## 2021-02-15 DIAGNOSIS — Z5111 Encounter for antineoplastic chemotherapy: Secondary | ICD-10-CM | POA: Diagnosis not present

## 2021-02-15 DIAGNOSIS — I10 Essential (primary) hypertension: Secondary | ICD-10-CM | POA: Diagnosis not present

## 2021-02-15 DIAGNOSIS — Z79899 Other long term (current) drug therapy: Secondary | ICD-10-CM | POA: Diagnosis not present

## 2021-02-15 DIAGNOSIS — Z510 Encounter for antineoplastic radiation therapy: Secondary | ICD-10-CM | POA: Diagnosis not present

## 2021-02-16 ENCOUNTER — Ambulatory Visit
Admission: RE | Admit: 2021-02-16 | Discharge: 2021-02-16 | Disposition: A | Discharge status: Home / Self Care | Payer: Medicare PPO | Source: Ambulatory Visit | Attending: Radiation Oncology | Admitting: Radiation Oncology

## 2021-02-16 DIAGNOSIS — I10 Essential (primary) hypertension: Secondary | ICD-10-CM | POA: Diagnosis not present

## 2021-02-16 DIAGNOSIS — Z923 Personal history of irradiation: Secondary | ICD-10-CM | POA: Diagnosis not present

## 2021-02-16 DIAGNOSIS — Z510 Encounter for antineoplastic radiation therapy: Secondary | ICD-10-CM | POA: Diagnosis not present

## 2021-02-16 DIAGNOSIS — C3491 Malignant neoplasm of unspecified part of right bronchus or lung: Secondary | ICD-10-CM | POA: Diagnosis not present

## 2021-02-16 DIAGNOSIS — Z5111 Encounter for antineoplastic chemotherapy: Secondary | ICD-10-CM | POA: Diagnosis not present

## 2021-02-16 DIAGNOSIS — Z79899 Other long term (current) drug therapy: Secondary | ICD-10-CM | POA: Diagnosis not present

## 2021-02-16 DIAGNOSIS — C770 Secondary and unspecified malignant neoplasm of lymph nodes of head, face and neck: Secondary | ICD-10-CM | POA: Diagnosis not present

## 2021-02-17 ENCOUNTER — Ambulatory Visit
Admission: RE | Admit: 2021-02-17 | Discharge: 2021-02-17 | Disposition: A | Discharge status: Home / Self Care | Payer: Medicare PPO | Source: Ambulatory Visit | Attending: Radiation Oncology | Admitting: Radiation Oncology

## 2021-02-17 DIAGNOSIS — Z79899 Other long term (current) drug therapy: Secondary | ICD-10-CM | POA: Diagnosis not present

## 2021-02-17 DIAGNOSIS — Z923 Personal history of irradiation: Secondary | ICD-10-CM | POA: Diagnosis not present

## 2021-02-17 DIAGNOSIS — Z510 Encounter for antineoplastic radiation therapy: Secondary | ICD-10-CM | POA: Diagnosis not present

## 2021-02-17 DIAGNOSIS — C3491 Malignant neoplasm of unspecified part of right bronchus or lung: Secondary | ICD-10-CM | POA: Diagnosis not present

## 2021-02-17 DIAGNOSIS — Z5111 Encounter for antineoplastic chemotherapy: Secondary | ICD-10-CM | POA: Diagnosis not present

## 2021-02-17 DIAGNOSIS — I10 Essential (primary) hypertension: Secondary | ICD-10-CM | POA: Diagnosis not present

## 2021-02-17 DIAGNOSIS — C770 Secondary and unspecified malignant neoplasm of lymph nodes of head, face and neck: Secondary | ICD-10-CM | POA: Diagnosis not present

## 2021-02-18 ENCOUNTER — Ambulatory Visit
Admission: RE | Admit: 2021-02-18 | Discharge: 2021-02-18 | Disposition: A | Discharge status: Home / Self Care | Payer: Medicare PPO | Source: Ambulatory Visit | Attending: Radiation Oncology | Admitting: Radiation Oncology

## 2021-02-18 ENCOUNTER — Encounter: Payer: Self-pay | Admitting: Oncology

## 2021-02-18 DIAGNOSIS — Z5111 Encounter for antineoplastic chemotherapy: Secondary | ICD-10-CM | POA: Diagnosis not present

## 2021-02-18 DIAGNOSIS — Z510 Encounter for antineoplastic radiation therapy: Secondary | ICD-10-CM | POA: Diagnosis not present

## 2021-02-18 DIAGNOSIS — Z923 Personal history of irradiation: Secondary | ICD-10-CM | POA: Diagnosis not present

## 2021-02-18 DIAGNOSIS — I10 Essential (primary) hypertension: Secondary | ICD-10-CM | POA: Diagnosis not present

## 2021-02-18 DIAGNOSIS — Z79899 Other long term (current) drug therapy: Secondary | ICD-10-CM | POA: Diagnosis not present

## 2021-02-18 DIAGNOSIS — C3491 Malignant neoplasm of unspecified part of right bronchus or lung: Secondary | ICD-10-CM | POA: Diagnosis not present

## 2021-02-18 DIAGNOSIS — C770 Secondary and unspecified malignant neoplasm of lymph nodes of head, face and neck: Secondary | ICD-10-CM | POA: Diagnosis not present

## 2021-02-18 NOTE — Addendum Note (Signed)
Addended by: Wilford Corner on: 02/18/2021 10:08 AM   Modules accepted: Orders

## 2021-02-21 ENCOUNTER — Telehealth (INDEPENDENT_AMBULATORY_CARE_PROVIDER_SITE_OTHER): Payer: Self-pay

## 2021-02-21 ENCOUNTER — Encounter: Payer: Self-pay | Admitting: Oncology

## 2021-02-21 ENCOUNTER — Ambulatory Visit
Admission: RE | Admit: 2021-02-21 | Discharge: 2021-02-21 | Disposition: A | Discharge status: Home / Self Care | Payer: Medicare PPO | Source: Ambulatory Visit | Attending: Radiation Oncology | Admitting: Radiation Oncology

## 2021-02-21 ENCOUNTER — Other Ambulatory Visit: Payer: Self-pay

## 2021-02-21 ENCOUNTER — Inpatient Hospital Stay: Payer: Medicare PPO

## 2021-02-21 ENCOUNTER — Inpatient Hospital Stay (HOSPITAL_BASED_OUTPATIENT_CLINIC_OR_DEPARTMENT_OTHER): Payer: Medicare PPO | Admitting: Oncology

## 2021-02-21 VITALS — BP 122/55 | HR 93 | Temp 96.9°F | Resp 18 | Wt 135.3 lb

## 2021-02-21 VITALS — BP 164/63 | HR 82 | Resp 18

## 2021-02-21 DIAGNOSIS — Z5111 Encounter for antineoplastic chemotherapy: Secondary | ICD-10-CM | POA: Diagnosis not present

## 2021-02-21 DIAGNOSIS — C538 Malignant neoplasm of overlapping sites of cervix uteri: Secondary | ICD-10-CM | POA: Diagnosis not present

## 2021-02-21 DIAGNOSIS — R7989 Other specified abnormal findings of blood chemistry: Secondary | ICD-10-CM | POA: Diagnosis not present

## 2021-02-21 DIAGNOSIS — C3491 Malignant neoplasm of unspecified part of right bronchus or lung: Secondary | ICD-10-CM

## 2021-02-21 DIAGNOSIS — Z510 Encounter for antineoplastic radiation therapy: Secondary | ICD-10-CM | POA: Diagnosis not present

## 2021-02-21 DIAGNOSIS — I10 Essential (primary) hypertension: Secondary | ICD-10-CM | POA: Diagnosis not present

## 2021-02-21 DIAGNOSIS — C770 Secondary and unspecified malignant neoplasm of lymph nodes of head, face and neck: Secondary | ICD-10-CM | POA: Diagnosis not present

## 2021-02-21 DIAGNOSIS — Z923 Personal history of irradiation: Secondary | ICD-10-CM | POA: Diagnosis not present

## 2021-02-21 DIAGNOSIS — Z79899 Other long term (current) drug therapy: Secondary | ICD-10-CM | POA: Diagnosis not present

## 2021-02-21 LAB — COMPREHENSIVE METABOLIC PANEL
ALT: 17 U/L (ref 0–44)
AST: 22 U/L (ref 15–41)
Albumin: 4.1 g/dL (ref 3.5–5.0)
Alkaline Phosphatase: 72 U/L (ref 38–126)
Anion gap: 13 (ref 5–15)
BUN: 31 mg/dL — ABNORMAL HIGH (ref 8–23)
CO2: 21 mmol/L — ABNORMAL LOW (ref 22–32)
Calcium: 9.4 mg/dL (ref 8.9–10.3)
Chloride: 97 mmol/L — ABNORMAL LOW (ref 98–111)
Creatinine, Ser: 1.29 mg/dL — ABNORMAL HIGH (ref 0.44–1.00)
GFR, Estimated: 44 mL/min — ABNORMAL LOW (ref >60)
Glucose, Bld: 94 mg/dL (ref 70–99)
Potassium: 4.2 mmol/L (ref 3.5–5.1)
Sodium: 131 mmol/L — ABNORMAL LOW (ref 135–145)
Total Bilirubin: 0.7 mg/dL (ref 0.3–1.2)
Total Protein: 7.2 g/dL (ref 6.5–8.1)

## 2021-02-21 LAB — CBC WITH DIFFERENTIAL/PLATELET
Abs Immature Granulocytes: 0.09 10*3/uL — ABNORMAL HIGH (ref 0.00–0.07)
Basophils Absolute: 0 10*3/uL (ref 0.0–0.1)
Basophils Relative: 0 %
Eosinophils Absolute: 0 10*3/uL (ref 0.0–0.5)
Eosinophils Relative: 0 %
HCT: 35.1 % — ABNORMAL LOW (ref 36.0–46.0)
Hemoglobin: 11.9 g/dL — ABNORMAL LOW (ref 12.0–15.0)
Immature Granulocytes: 1 %
Lymphocytes Relative: 7 %
Lymphs Abs: 0.7 10*3/uL (ref 0.7–4.0)
MCH: 32.8 pg (ref 26.0–34.0)
MCHC: 33.9 g/dL (ref 30.0–36.0)
MCV: 96.7 fL (ref 80.0–100.0)
Monocytes Absolute: 0.7 10*3/uL (ref 0.1–1.0)
Monocytes Relative: 7 %
Neutro Abs: 9 10*3/uL — ABNORMAL HIGH (ref 1.7–7.7)
Neutrophils Relative %: 85 %
Platelets: 341 10*3/uL (ref 150–400)
RBC: 3.63 MIL/uL — ABNORMAL LOW (ref 3.87–5.11)
RDW: 12.5 % (ref 11.5–15.5)
WBC: 10.6 10*3/uL — ABNORMAL HIGH (ref 4.0–10.5)
nRBC: 0 % (ref 0.0–0.2)

## 2021-02-21 MED ORDER — SODIUM CHLORIDE 0.9 % IV SOLN
Freq: Once | INTRAVENOUS | Status: AC
  Filled 2021-02-21: qty 250

## 2021-02-21 MED ORDER — DIPHENHYDRAMINE HCL 50 MG/ML IJ SOLN
50.0000 mg | Freq: Once | INTRAMUSCULAR | Status: AC
  Administered 2021-02-21: 50 mg via INTRAVENOUS
  Filled 2021-02-21: qty 1

## 2021-02-21 MED ORDER — SODIUM CHLORIDE 0.9 % IV SOLN
124.8000 mg | Freq: Once | INTRAVENOUS | Status: AC
  Administered 2021-02-21: 120 mg via INTRAVENOUS
  Filled 2021-02-21: qty 12

## 2021-02-21 MED ORDER — SODIUM CHLORIDE 0.9 % IV SOLN
45.0000 mg/m2 | Freq: Once | INTRAVENOUS | Status: AC
  Administered 2021-02-21: 78 mg via INTRAVENOUS
  Filled 2021-02-21: qty 13

## 2021-02-21 MED ORDER — FAMOTIDINE IN NACL 20-0.9 MG/50ML-% IV SOLN
20.0000 mg | Freq: Once | INTRAVENOUS | Status: AC
  Administered 2021-02-21: 20 mg via INTRAVENOUS
  Filled 2021-02-21: qty 50

## 2021-02-21 MED ORDER — PALONOSETRON HCL INJECTION 0.25 MG/5ML
0.2500 mg | Freq: Once | INTRAVENOUS | Status: AC
  Administered 2021-02-21: 0.25 mg via INTRAVENOUS
  Filled 2021-02-21: qty 5

## 2021-02-21 MED ORDER — SODIUM CHLORIDE 0.9 % IV SOLN
10.0000 mg | Freq: Once | INTRAVENOUS | Status: AC
  Administered 2021-02-21: 10 mg via INTRAVENOUS
  Filled 2021-02-21: qty 10

## 2021-02-21 NOTE — Patient Instructions (Signed)
[facility] [place]  Discharge Instructions: Thank you for choosing [facility] to provide your oncology and hematology care.  If you have a lab appointment with the [facility], please go directly to the [facility] and check in at the registration area.  Wear comfortable clothing and clothing appropriate for easy access to any Portacath or PICC line.   We strive to give you quality time with your provider. You may need to reschedule your appointment if you arrive late (15 or more minutes).  Arriving late affects you and other patients whose appointments are after yours.  Also, if you miss three or more appointments without notifying the office, you may be dismissed from the clinic at the providers discretion.      For prescription refill requests, have your pharmacy contact our office and allow 72 hours for refills to be completed.    Today you received the following chemotherapy and/or immunotherapy agents TAXOL and CARBOPLATIN      To help prevent nausea and vomiting after your treatment, we encourage you to take your nausea medication as directed.  BELOW ARE SYMPTOMS THAT SHOULD BE REPORTED IMMEDIATELY: *FEVER GREATER THAN 100.4 F (38 C) OR HIGHER *CHILLS OR SWEATING *NAUSEA AND VOMITING THAT IS NOT CONTROLLED WITH YOUR NAUSEA MEDICATION *UNUSUAL SHORTNESS OF BREATH *UNUSUAL BRUISING OR BLEEDING *URINARY PROBLEMS (pain or burning when urinating, or frequent urination) *BOWEL PROBLEMS (unusual diarrhea, constipation, pain near the anus) TENDERNESS IN MOUTH AND THROAT WITH OR WITHOUT PRESENCE OF ULCERS (sore throat, sores in mouth, or a toothache) UNUSUAL RASH, SWELLING OR PAIN  UNUSUAL VAGINAL DISCHARGE OR ITCHING   Items with * indicate a potential emergency and should be followed up as soon as possible or go to the Emergency Department if any problems should occur.  Please show the CHEMOTHERAPY ALERT CARD or IMMUNOTHERAPY ALERT CARD at  check-in to the Emergency Department and triage nurse.  Should you have questions after your visit or need to cancel or reschedule your appointment, please contact [facility] [place] [facility] [place]  [phone] and follow the prompts.  Office hours are 8:00 a.m. to 4:30 p.m. Monday - Friday. Please note that voicemails left after 4:00 p.m. may not be returned until the following business day.  We are closed weekends and major holidays. You have access to a nurse at all times for urgent questions. Please call the main number to the clinic [phone] and follow the prompts.  For any non-urgent questions, you may also contact your provider using MyChart. We now offer e-Visits for anyone 57 and older to request care online for non-urgent symptoms. For details visit [email or web address].   Also download the MyChart app! Go to the app store, search "MyChart", open the app, select [facility], and log in with your MyChart username and password.  Due to Covid, a mask is required upon entering the hospital/clinic. If you do not have a mask, one will be given to you upon arrival. For doctor visits, patients may have 1 support person aged 69 or older with them. For treatment visits, patients cannot have anyone with them due to current Covid guidelines and our immunocompromised population.   Paclitaxel injection What is this medication? PACLITAXEL (PAK li TAX el) is a chemotherapy drug. It targets fast dividing cells, like cancer cells, and causes these cells to die. This medicine is used to treat ovarian cancer, breast cancer, lung cancer, Kaposi's sarcoma, and other cancers. This medicine may be used for other  purposes; ask your health care provider or pharmacist if you have questions. COMMON BRAND NAME(S): Onxol, Taxol What should I tell my care team before I take this medication? They need to know if you have any of these conditions: history of irregular heartbeat liver disease low blood  counts, like low white cell, platelet, or red cell counts lung or breathing disease, like asthma tingling of the fingers or toes, or other nerve disorder an unusual or allergic reaction to paclitaxel, alcohol, polyoxyethylated castor oil, other chemotherapy, other medicines, foods, dyes, or preservatives pregnant or trying to get pregnant breast-feeding How should I use this medication? This drug is given as an infusion into a vein. It is administered in a hospital or clinic by a specially trained health care professional. Talk to your pediatrician regarding the use of this medicine in children. Special care may be needed. Overdosage: If you think you have taken too much of this medicine contact a poison control center or emergency room at once. NOTE: This medicine is only for you. Do not share this medicine with others. What if I miss a dose? It is important not to miss your dose. Call your doctor or health care professional if you are unable to keep an appointment. What may interact with this medication? Do not take this medicine with any of the following medications: live virus vaccines This medicine may also interact with the following medications: antiviral medicines for hepatitis, HIV or AIDS certain antibiotics like erythromycin and clarithromycin certain medicines for fungal infections like ketoconazole and itraconazole certain medicines for seizures like carbamazepine, phenobarbital, phenytoin gemfibrozil nefazodone rifampin St. John's wort This list may not describe all possible interactions. Give your health care provider a list of all the medicines, herbs, non-prescription drugs, or dietary supplements you use. Also tell them if you smoke, drink alcohol, or use illegal drugs. Some items may interact with your medicine. What should I watch for while using this medication? Your condition will be monitored carefully while you are receiving this medicine. You will need important  blood work done while you are taking this medicine. This medicine can cause serious allergic reactions. To reduce your risk you will need to take other medicine(s) before treatment with this medicine. If you experience allergic reactions like skin rash, itching or hives, swelling of the face, lips, or tongue, tell your doctor or health care professional right away. In some cases, you may be given additional medicines to help with side effects. Follow all directions for their use. This drug may make you feel generally unwell. This is not uncommon, as chemotherapy can affect healthy cells as well as cancer cells. Report any side effects. Continue your course of treatment even though you feel ill unless your doctor tells you to stop. Call your doctor or health care professional for advice if you get a fever, chills or sore throat, or other symptoms of a cold or flu. Do not treat yourself. This drug decreases your body's ability to fight infections. Try to avoid being around people who are sick. This medicine may increase your risk to bruise or bleed. Call your doctor or health care professional if you notice any unusual bleeding. Be careful brushing and flossing your teeth or using a toothpick because you may get an infection or bleed more easily. If you have any dental work done, tell your dentist you are receiving this medicine. Avoid taking products that contain aspirin, acetaminophen, ibuprofen, naproxen, or ketoprofen unless instructed by your doctor. These medicines may  hide a fever. Do not become pregnant while taking this medicine. Women should inform their doctor if they wish to become pregnant or think they might be pregnant. There is a potential for serious side effects to an unborn child. Talk to your health care professional or pharmacist for more information. Do not breast-feed an infant while taking this medicine. Men are advised not to father a child while receiving this medicine. This product  may contain alcohol. Ask your pharmacist or healthcare provider if this medicine contains alcohol. Be sure to tell all healthcare providers you are taking this medicine. Certain medicines, like metronidazole and disulfiram, can cause an unpleasant reaction when taken with alcohol. The reaction includes flushing, headache, nausea, vomiting, sweating, and increased thirst. The reaction can last from 30 minutes to several hours. What side effects may I notice from receiving this medication? Side effects that you should report to your doctor or health care professional as soon as possible: allergic reactions like skin rash, itching or hives, swelling of the face, lips, or tongue breathing problems changes in vision fast, irregular heartbeat high or low blood pressure mouth sores pain, tingling, numbness in the hands or feet signs of decreased platelets or bleeding - bruising, pinpoint red spots on the skin, black, tarry stools, blood in the urine signs of decreased red blood cells - unusually weak or tired, feeling faint or lightheaded, falls signs of infection - fever or chills, cough, sore throat, pain or difficulty passing urine signs and symptoms of liver injury like dark yellow or brown urine; general ill feeling or flu-like symptoms; light-colored stools; loss of appetite; nausea; right upper belly pain; unusually weak or tired; yellowing of the eyes or skin swelling of the ankles, feet, hands unusually slow heartbeat Side effects that usually do not require medical attention (report to your doctor or health care professional if they continue or are bothersome): diarrhea hair loss loss of appetite muscle or joint pain nausea, vomiting pain, redness, or irritation at site where injected tiredness This list may not describe all possible side effects. Call your doctor for medical advice about side effects. You may report side effects to FDA at 1-800-FDA-1088. Where should I keep my  medication? This drug is given in a hospital or clinic and will not be stored at home. NOTE: This sheet is a summary. It may not cover all possible information. If you have questions about this medicine, talk to your doctor, pharmacist, or health care provider.  2022 Elsevier/Gold Standard (2020-10-05 00:00:00)  Carboplatin injection What is this medication? CARBOPLATIN (KAR boe pla tin) is a chemotherapy drug. It targets fast dividing cells, like cancer cells, and causes these cells to die. This medicine is used to treat ovarian cancer and many other cancers. This medicine may be used for other purposes; ask your health care provider or pharmacist if you have questions. COMMON BRAND NAME(S): Paraplatin What should I tell my care team before I take this medication? They need to know if you have any of these conditions: blood disorders hearing problems kidney disease recent or ongoing radiation therapy an unusual or allergic reaction to carboplatin, cisplatin, other chemotherapy, other medicines, foods, dyes, or preservatives pregnant or trying to get pregnant breast-feeding How should I use this medication? This drug is usually given as an infusion into a vein. It is administered in a hospital or clinic by a specially trained health care professional. Talk to your pediatrician regarding the use of this medicine in children. Special care may be  needed. Overdosage: If you think you have taken too much of this medicine contact a poison control center or emergency room at once. NOTE: This medicine is only for you. Do not share this medicine with others. What if I miss a dose? It is important not to miss a dose. Call your doctor or health care professional if you are unable to keep an appointment. What may interact with this medication? medicines for seizures medicines to increase blood counts like filgrastim, pegfilgrastim, sargramostim some antibiotics like amikacin, gentamicin, neomycin,  streptomycin, tobramycin vaccines Talk to your doctor or health care professional before taking any of these medicines: acetaminophen aspirin ibuprofen ketoprofen naproxen This list may not describe all possible interactions. Give your health care provider a list of all the medicines, herbs, non-prescription drugs, or dietary supplements you use. Also tell them if you smoke, drink alcohol, or use illegal drugs. Some items may interact with your medicine. What should I watch for while using this medication? Your condition will be monitored carefully while you are receiving this medicine. You will need important blood work done while you are taking this medicine. This drug may make you feel generally unwell. This is not uncommon, as chemotherapy can affect healthy cells as well as cancer cells. Report any side effects. Continue your course of treatment even though you feel ill unless your doctor tells you to stop. In some cases, you may be given additional medicines to help with side effects. Follow all directions for their use. Call your doctor or health care professional for advice if you get a fever, chills or sore throat, or other symptoms of a cold or flu. Do not treat yourself. This drug decreases your body's ability to fight infections. Try to avoid being around people who are sick. This medicine may increase your risk to bruise or bleed. Call your doctor or health care professional if you notice any unusual bleeding. Be careful brushing and flossing your teeth or using a toothpick because you may get an infection or bleed more easily. If you have any dental work done, tell your dentist you are receiving this medicine. Avoid taking products that contain aspirin, acetaminophen, ibuprofen, naproxen, or ketoprofen unless instructed by your doctor. These medicines may hide a fever. Do not become pregnant while taking this medicine. Women should inform their doctor if they wish to become pregnant or  think they might be pregnant. There is a potential for serious side effects to an unborn child. Talk to your health care professional or pharmacist for more information. Do not breast-feed an infant while taking this medicine. What side effects may I notice from receiving this medication? Side effects that you should report to your doctor or health care professional as soon as possible: allergic reactions like skin rash, itching or hives, swelling of the face, lips, or tongue signs of infection - fever or chills, cough, sore throat, pain or difficulty passing urine signs of decreased platelets or bleeding - bruising, pinpoint red spots on the skin, black, tarry stools, nosebleeds signs of decreased red blood cells - unusually weak or tired, fainting spells, lightheadedness breathing problems changes in hearing changes in vision chest pain high blood pressure low blood counts - This drug may decrease the number of white blood cells, red blood cells and platelets. You may be at increased risk for infections and bleeding. nausea and vomiting pain, swelling, redness or irritation at the injection site pain, tingling, numbness in the hands or feet problems with balance, talking, walking  trouble passing urine or change in the amount of urine Side effects that usually do not require medical attention (report to your doctor or health care professional if they continue or are bothersome): hair loss loss of appetite metallic taste in the mouth or changes in taste This list may not describe all possible side effects. Call your doctor for medical advice about side effects. You may report side effects to FDA at 1-800-FDA-1088. Where should I keep my medication? This drug is given in a hospital or clinic and will not be stored at home. NOTE: This sheet is a summary. It may not cover all possible information. If you have questions about this medicine, talk to your doctor, pharmacist, or health care  provider.  2022 Elsevier/Gold Standard (2007-06-26 00:00:00)

## 2021-02-21 NOTE — Progress Notes (Signed)
Pt here for follow up. No new concerns voiced.   

## 2021-02-21 NOTE — Progress Notes (Signed)
Pt states she took am BP meds  Starting taxol bp 125/55 1037- rate increase- BP 155/61 1053- rate increase - BP 170-69 Taxol paused- Dt Yu aware- ordered to keep paused and check BP  1115-BP 176/68- taxol remains paused 1130 BP 173/66- taxol remains paused 1155 BP 147/53.  Ordered to retsart taxol restarted at 125cc- VTBI 31  1215- BP 177/72- Taxol paused. Dr Tasia Catchings notified- awaiting orders 1237- med restarted at 125cc per Dr Tasia Catchings. Pt denies feeling anxious, nervous or worried.  Dr Tasia Catchings aware 1253- BP 172/63- Dr Tasia Catchings states to continue infusion at rate of 125cc 1310- BP 167/72- ordered to finish bag at rate of 125 1415- taxol completed at rate of 125.  Bp 150/69. Dr Tasia Catchings aware. Also asked how to handle future infusions as we never got to full rate.  Dr Tasia Catchings suggested to run future infusions at 125.  Harrell Gave from pharmacy, pt and Lora Paula Rn aware

## 2021-02-21 NOTE — Telephone Encounter (Signed)
I attempted to contact the patient to schedule a port placement and a message was left for a return call.

## 2021-02-21 NOTE — Progress Notes (Deleted)
  The note originally documented on this encounter has been moved the the encounter in which it belongs.  

## 2021-02-21 NOTE — H&P (Signed)
Hematology/Oncology progress note [phone] [phone]   Patient Care Team: [name], DO as PCP - General (Family Medicine) Clent Jacks, RN as Oncology Nurse Navigator Noreene Filbert, MD as Radiation Oncologist (Radiation Oncology) Telford Nab, RN as Oncology Nurse Navigator  REFERRING PROVIDER: [name], DO  CHIEF COMPLAINTS/REASON FOR VISIT:  Follow up lung cancer treatments  HISTORY OF PRESENTING ILLNESS:  # Cervix cancer Patient has developed postmenopausal vaginal bleeding and discharge.  She was noted to have a friable cervix/2 cm mass of the cervix, concerning for malignancy 12/19/2018 endometrial biopsy showed scattered atypical squamous cells suspicious for malignancy.  Predominantly necrosis with associated inflammation. 01/01/2019 initial cervix biopsy showed at least high-grade squamous intraepithelial lesion.-HPV negative 01/22/2019, repeat vaginal wall biopsy and cervix 9:00 biopsy showed squamous cell carcinoma.    Staging images 12/31/2018 showed fluid distending the endometrial canal and or endometrial thickening to the level of cervix.  No evidence of pathological lymphadenopathy.  Haziness about the lower cervix.  7 mm irregular pulmonary nodule within the right upper lobe, suspicious for possible primary or metastatic malignancy.  Chronic pleural-parenchymal scarring/fibrosis at the lung apices.  Large amount of stool.  No bowel obstruction. 01/08/2019 PET scan showed marked hypermetabolism in the region of the cervix, compatible with the reported history of cervical cancer. Small bilateral pelvic sidewall lymph nodes discernible FDG accumulation, concerning for metastatic disease although neither lymph node is enlarged by CT size criteria. 7 mm nodule in the right upper lobe shows discernible FDG accumulation.  Questionable neoplasm, primary versus metastatic. Emphysema.   #Chronic hearing loss  # Lung nodule, FDG  avid, questionable primary bronchogenic carcinoma versus metastatic cancer.Discussed with radiation oncology.  Her case was also discussed on tumor board.  Consensus reached on finishing concurrent chemoradiation treatments for cervix cancer first. Possible SBRT to lung nodule for presumed primary lung cancer.  # 02/20/2020 - 03/19/2020 concurrent chemotherapy weekly carboplatin and taxol and radiation for treatment of this cervix cancer.  #December 2021 lung nodule,was evaluated by Dr.Oaks. Options of biopsy via transthoracic or endobronchial approach vs surgical resection.  Patient opted to empiric SBRT  Patient was last seen by me on 06/25/2019.  Lost follow-up. She continues to follow with radiation oncology. 11/20/2020, CT chest with contrast showed minimal residual of previously seen right upper lobe nodule.  0.6 x 0.4 cm.  Internal development of mild bandlike radiation fibrosis.  Newly enlarged pretracheal lymph node, measuring 2.5 x 1.7 cm.  Highly concerning for metastatic disease.  Unchanged prominent AP window and the right hilar lymph nodes.  Emphysema and diffuse bilateral bronchial wall thickening.  Coronary artery disease. 12/13/2020, PET scan showed enlarging hypermetabolic right lower paratracheal lymph node, compatible with metastatic disease.  SUV 10.3.  Mild FDG uptake of the bilateral sacral alla with associated lucency.  Concerning for sacral insufficiency fracture.  Stable posttreatment changes of the right upper lobe nodule.  Nonspecific small solid 3 mm pulmonary nodule of the left lower lobe.  2 small to be characterized.  No evidence of FDG avid metastatic disease in the abdomen or pelvis.  Aortic atherosclerosis and emphysema.  Patient reestablish care on 12/27/2020 for lung cancer treatments   #01/12/2021 patient underwent bronchoscopy biopsy by Dr. Patsey Berthold Right paratracheal lymph node fine-needle aspiration is positive for metastatic non-small cell carcinoma, favor  adenocarcinoma of lung origin.  # 02/14/2021, concurrent carboplatin AUC 2 and taxol 33m/m2  INTERVAL HISTORY Mary CZERWINSKIis a 76y.o. female who has above history  reviewed by me today presents for follow up visit for management of possible recurrent lung cancer.  History of cervix cancer.   BP is much better controlled.  She tolerates last chemotherapy treatment. Denies nausea, vomiting, diarrhea.  No new complaint today.    Review of Systems  Constitutional:  Negative for appetite change, chills, fatigue and fever.  HENT:   Positive for hearing loss. Negative for voice change.   Eyes:  Negative for eye problems.  Respiratory:  Negative for chest tightness and cough.   Cardiovascular:  Negative for chest pain.  Gastrointestinal:  Negative for abdominal distention, abdominal pain and blood in stool.  Endocrine: Negative for hot flashes.  Genitourinary:  Negative for difficulty urinating and frequency.   Musculoskeletal:  Negative for arthralgias.  Skin:  Negative for itching and rash.  Neurological:  Negative for extremity weakness.  Hematological:  Negative for adenopathy.  Psychiatric/Behavioral:  Negative for confusion.    MEDICAL HISTORY:  Past Medical History:  Diagnosis Date   Arthritis    Cancer ([place])    Carotid atherosclerosis    Carotid bruit    Cervical cancer (HCC)    COPD (chronic obstructive pulmonary disease) (HCC)    emphysema   Coronary artery disease    Family history of adverse reaction to anesthesia    paternal grandfather died during surgery-pt unaware what happened   GERD (gastroesophageal reflux disease)    Hyperlipidemia    Hypertension    Hyponatremia 02/27/2019   Hypothyroidism    Infected cat bite 1980s   was hospitalized   Irregular heartbeat    Medical history non-contributory    Peripheral vascular disease ([place])    Tobacco use     SURGICAL HISTORY: Past Surgical History:  Procedure Laterality Date   CAROTID ENDARTERECTOMY Right  Oct. 2015   Dr. Lucky Cowboy   CAROTID STENOSIS Left April 2016   carotid stenosis surgery   COLONOSCOPY WITH PROPOFOL N/A 11/08/2016   Procedure: COLONOSCOPY WITH PROPOFOL;  Surgeon: Jonathon Bellows, MD;  Location: [facility] ENDOSCOPY;  Service: Gastroenterology;  Laterality: N/A;   ESOPHAGOGASTRODUODENOSCOPY (EGD) WITH PROPOFOL N/A 11/08/2016   Procedure: ESOPHAGOGASTRODUODENOSCOPY (EGD) WITH PROPOFOL;  Surgeon: Jonathon Bellows, MD;  Location[facility] END[facility]COPY;  Service: Gastroenterology;  Laterality: N/A;   FLEXIBLE BRONCHOSCOPY N/A 12/17/2014   Procedure: FLEXIBLE BRONCHOSCOPY;  Surgeon: Wilhelmina Mcardle, MD;  Location: [facility] ORS;  Service: Pulmonary;  Laterality: N/A;   INCISION AND DRAINAGE / EXCISION THYROGLOSSAL CYST  April 2011   VIDEO BRONCHOSCOPY WITH ENDOBRONCHIAL ULTRASOUND N/A 01/12/2021   Procedure: VIDEO BRONCHOSCOPY WITH ENDOBRONCHIAL ULTRASOUND;  Surgeon: Tyler Pita, MD;  Location: [facility] ORS;  Service: Cardiopulmonary;  Laterality: N/A;    SOCIAL HISTORY: Social History   Socioeconomic History   Marital status: Married    Spouse name: Not on file   Number of children: Not on file   Years of education: Not on file   Highest education level: 9th grade  Occupational History   Occupation: retired   Tobacco Use   Smoking status: Every Day    Packs/day: 1.75    Years: 50.00    Pack years: 87.50    Types: Cigarettes   Smokeless tobacco: Never   Tobacco comments:    0.5 PPD 02/10/2021  Vaping Use   Vaping Use: Former  Substance and Sexual Activity   Alcohol use: No    Alcohol/week: 0.0 standard drinks   Drug use: No   Sexual activity: Yes  Other Topics Concern   Not on file  Social History Narrative   Lives at home with husband   Social Determinants of Radio broadcast assistant Strain: Not on file  Food Insecurity: Not on file  Transportation Needs: Not on file  Physical Activity: Not on file  Stress: Not on file  Social Connections: Not on file  Intimate Partner  Violence: Not on file    FAMILY HISTORY: Family History  Problem Relation Age of Onset   Congestive Heart Failure Mother    Heart disease Mother    Hypertension Mother    Hypertension Sister    Diabetes Sister    Heart disease Sister    Heart disease Maternal Uncle    Heart disease Maternal Grandmother    Stroke Maternal Grandfather    Cancer Brother        liver, lung   Heart disease Brother    Hypertension Brother    Heart attack Brother    COPD Neg Hx    Breast cancer Neg Hx     ALLERGIES:  is allergic to atorvastatin.  MEDICATIONS:  Current Outpatient Medications  Medication Sig Dispense Refill   acetaminophen (TYLENOL) 500 MG tablet Take 500 mg by mouth every 6 (six) hours as needed for moderate pain.     alendronate (FOSAMAX) 70 MG tablet Take 1 tablet (70 mg total) by mouth every 7 (seven) days. Take with a full glass of water on an empty stomach. 12 tablet 3   clopidogrel (PLAVIX) 75 MG tablet Take 1 tablet (75 mg total) by mouth daily. 90 tablet 3   cyclobenzaprine (FLEXERIL) 10 MG tablet TAKE 1 TABLET AT BEDTIME 60 tablet 3   dexamethasone (DECADRON) 4 MG tablet Take 2 tablets (8 mg total) by mouth daily. Take for 2 days after chemotherapy. . 30 tablet 1   ferrous sulfate 325 (65 FE) MG EC tablet Take 1 tablet (325 mg total) by mouth daily with breakfast. 90 tablet 4   Fluticasone-Umeclidin-Vilant (TRELEGY ELLIPTA) 100-62.5-25 MCG/INH AEPB Inhale 1 puff into the lungs daily. 1 each 11   gabapentin (NEURONTIN) 100 MG capsule Take 1 capsule (100 mg total) by mouth 3 (three) times daily. 270 capsule 1   levothyroxine (SYNTHROID) 75 MCG tablet Take 1 tablet (75 mcg total) by mouth daily. 90 tablet 3   lisinopril (ZESTRIL) 20 MG tablet Take 1 tablet (20 mg total) by mouth daily. (Patient taking differently: Take 20 mg by mouth every evening.) 90 tablet 1   montelukast (SINGULAIR) 10 MG tablet Take 1 tablet (10 mg total) by mouth See admin instructions. Take 73m on the day  before chemotherapy 30 tablet 0   Multiple Vitamin (MULTIVITAMIN) tablet Take 1 tablet by mouth daily.     naproxen (NAPROSYN) 500 MG tablet TAKE 1 TABLET TWICE DAILY WITH MEALS 180 tablet 1   omeprazole (PRILOSEC) 20 MG capsule Take 1 capsule (20 mg total) by mouth daily. (Patient taking differently: Take 20 mg by mouth daily before lunch.) 90 capsule 1   ondansetron (ZOFRAN) 8 MG tablet Take 1 tablet (8 mg total) by mouth 2 (two) times daily as needed for refractory nausea / vomiting. Start on day 3 after chemo. 30 tablet 1   vitamin B-12 (CYANOCOBALAMIN) 500 MCG tablet Take 500 mcg by mouth daily.     No current facility-administered medications for this visit.     PHYSICAL EXAMINATION: ECOG PERFORMANCE STATUS: 1 - Symptomatic but completely ambulatory Vitals:   02/21/21 0835  BP: (!) 122/55  Pulse: 93  Resp: 18  Temp: (!) 96.9 F (36.1 C)  SpO2: 100%   Filed Weights   02/21/21 0835  Weight: 135 lb 4.8 oz (61.4 kg)    Physical Exam Constitutional:      General: She is not in acute distress. HENT:     Head: Normocephalic and atraumatic.  Eyes:     General: No scleral icterus.    Pupils: Pupils are equal, round, and reactive to light.  Cardiovascular:     Rate and Rhythm: Normal rate and regular rhythm.     Heart sounds: Normal heart sounds.  Pulmonary:     Effort: Pulmonary effort is normal. No respiratory distress.     Breath sounds: No wheezing.     Comments: Decreased breath sound bilaterally  Abdominal:     General: Bowel sounds are normal. There is no distension.     Palpations: Abdomen is soft. There is no mass.     Tenderness: There is no abdominal tenderness.  Musculoskeletal:        General: No deformity. Normal range of motion.     Cervical back: Normal range of motion and neck supple.  Skin:    General: Skin is warm and dry.     Findings: No erythema or rash.  Neurological:     Mental Status: She is alert and oriented to person, place, and time.  Mental status is at baseline.     Cranial Nerves: No cranial nerve deficit.     Coordination: Coordination normal.  Psychiatric:        Mood and Affect: Mood normal.    LABORATORY DATA:  I have reviewed the data as listed Lab Results  Component Value Date   WBC 10.6 (H) 02/21/2021   HGB 11.9 (L) 02/21/2021   HCT 35.1 (L) 02/21/2021   MCV 96.7 02/21/2021   PLT 341 02/21/2021   Recent Labs    11/09/20 1442 01/04/21 1452 02/09/21 0900 02/14/21 0831  NA 130* 132* 132* 131*  K 4.3 4.5 4.4 4.1  CL 95* 96 101 99  CO2 19* _0 GLUCOSE 83 74 78 90  BUN _1 CREATININE 1.01* 0.97 1.09* 0.96  CALCIUM 9.1 9.3 8.9 9.0  GFRNONAA  --   --  54* >60  PROT 6.3  --  6.7 6.8  ALBUMIN 4.2  --  3.8 3.9  AST 17  --  17 15  ALT 9  --  13 12  ALKPHOS 74  --  64 70  BILITOT 0.2  --  0.6 0.6    Iron/TIBC/Ferritin/ %Sat    Component Value Date/Time   IRON 55 10/31/2016 1546   TIBC 339 10/31/2016 1546   FERRITIN 18 10/31/2016 1546   IRONPCTSAT 16 10/31/2016 1546      RADIOGRAPHIC STUDIES: I have personally reviewed the radiological images as listed and agreed with the findings in the report. MR Brain W Wo Contrast  Result Date: 02/01/2021 CLINICAL DATA:  Malignant neoplasm of overlapping sites of cervix ([place]) C53.8 (ICD-10-CM). Lung cancer follow up. EXAM: MRI HEAD WITHOUT AND WITH CONTRAST TECHNIQUE: Multiplanar, multiecho pulse sequences of the brain and surrounding structures were obtained without and with intravenous contrast. CONTRAST:  53m GADAVIST GADOBUTROL 1 MMOL/ML IV SOLN COMPARISON:  MRI of the brain August 18, 2004. FINDINGS: Brain: No acute infarction, hemorrhage, hydrocephalus, extra-axial collection or mass lesion. Scattered foci of T2 hyperintensity are seen within the white matter of cerebral hemispheres, most likely small vessel ischemia, mildly progressed since prior  MRI. A few punctate foci of susceptibility artifact are seen scattered in the right cerebral  hemisphere, may represent hemosiderin deposits. No focus of abnormal contrast enhancement to suggest intracranial metastatic disease. Vascular: Normal flow voids. Skull and upper cervical spine: Normal marrow signal. Sinuses/Orbits: Negative. Other: Trace mucosal thickening in the bilateral mastoid cells. Epidermal inclusion cyst measuring 1.4 cm posteriorly in the neck subcutaneous. IMPRESSION: 1. No evidence of intracranial metastatic disease. 2. Mild-to-moderate chronic microvascular ischemic changes of the white. Electronically Signed   By: Pedro Earls M.D.   On: 02/01/2021 13:53   MR Lumbar Spine Wo Contrast  Result Date: 01/21/2021 CLINICAL DATA:  Low back pain, increased fracture risk ?sacral insuffiency fracture on PET scan EXAM: MRI LUMBAR SPINE WITHOUT CONTRAST TECHNIQUE: Multiplanar, multisequence MR imaging of the lumbar spine was performed. No intravenous contrast was administered. COMPARISON:  PET-CT 12/13/2020, x-ray 05/20/2020 FINDINGS: Segmentation: Transitional lumbosacral anatomy with partial sacralization of the L5 segment. Lowest rib-bearing segment is labeled as T12 (axial series 8, image 6). Alignment:  Trace retrolisthesis of L4 on L5. Vertebrae: Mild superior endplate compression fracture of L5 with approximately 15% vertebral body height loss. Mild bone marrow edema within the L5 vertebral body. There is extensive bone marrow edema within the bilateral sacral ala associated with bilateral sacral fractures, likely subacute given findings on previous PET-CT. Remaining vertebral bodies of the lumbar spine are intact with preserved height. No additional fracture. No evidence of discitis. No suspicious marrow replacing bone lesion. Conus medullaris and cauda equina: Conus extends to the L1 level. Conus and cauda equina appear normal. Paraspinal and other soft tissues: Negative. Disc levels: T12-L1: Minimal disc bulge. Mild bilateral facet arthropathy. No foraminal or canal  stenosis. L1-L2: Minimal disc bulge with small left foraminal protrusion. Mild bilateral facet hypertrophy. No foraminal or canal stenosis. L2-L3: Mild annular disc bulge. Mild bilateral facet arthropathy with ligamentum flavum buckling. Findings result in mild canal stenosis. No significant foraminal stenosis. L3-L4: Mild annular disc bulge. Mild bilateral facet arthropathy with ligamentum flavum buckling. Findings result in mild canal stenosis. No significant foraminal stenosis. L4-L5: Retrolisthesis with disc bulge and endplate ridging. Mild-moderate bilateral facet arthropathy with ligamentum flavum buckling. Mild bilateral subarticular recess stenosis without canal stenosis. No significant foraminal stenosis. L5-S1: Transitional level.  No evidence of impingement. IMPRESSION: 1. Transitional lumbosacral anatomy, as above. 2. Subacute-appearing mild superior endplate compression fracture of L5 with approximately 15% vertebral body height loss. 3. Extensive bone marrow edema within the bilateral sacral ala associated with bilateral sacral fractures, likely subacute given findings on previous PET-CT. 4. Mild multilevel degenerative changes of the lumbar spine as described. 5. Mild canal stenosis at L2-3 and L3-4. 6. Mild bilateral subarticular recess stenosis at L4-5. Electronically Signed   By: Davina Poke D.O.   On: 01/21/2021 09:10   NM PET Image Restag (PS) Skull Base To Thigh  Result Date: 12/14/2020 CLINICAL DATA:  Follow-up treatment strategy for non-small cell lung cancer. EXAM: NUCLEAR MEDICINE PET SKULL BASE TO THIGH TECHNIQUE: 7.0 mCi F-18 FDG was injected intravenously. Full-ring PET imaging was performed from the skull base to thigh after the radiotracer. CT data was obtained and used for attenuation correction and anatomic localization. Fasting blood glucose: 79 mg/dl COMPARISON:  Chest CT dated November 18, 2020 FINDINGS: Mediastinal blood pool activity: SUV max 2.1 Liver activity: SUV max  2.8 NECK: No hypermetabolic lymph nodes in the neck. Incidental CT findings: none CHEST: Enlarged and hypermetabolic right lower paratracheal lymph node measuring 1.4  cm in short axis on series 3, image 85 with an SUV max of 10.3, unchanged in size when compared with prior in remeasured in similar plane. Unchanged prominent AP window and right hilar lymph nodes which demonstrate no hypermetabolic activity. No additional hypermetabolic lymph nodes seen in the chest. Right upper lobe ground-glass opacities with mild FDG uptake below mediastinal blood pool, likely post radiation change. Tiny solid right upper lobe pulmonary nodule measuring approximately 5 mm on image 81. Pulmonary nodule of the left lower lobe measuring 3 mm on series 3, image 117, unchanged compared to prior, but new when compared with more remote prior exams. Incidental CT findings: Centrilobular emphysema. Three-vessel coronary artery calcifications. Atherosclerotic disease of the thoracic aorta. ABDOMEN/PELVIS: No abnormal hypermetabolic activity within the liver, pancreas, adrenal glands, or spleen. No hypermetabolic lymph nodes in the abdomen or pelvis. Incidental CT findings: Atherosclerotic disease of the thoracic aorta. Calcifications of the liver, likely sequela prior granulomatous infection. SKELETON: Mild diffuse FDG uptake of the bilateral sacral ala with associated lucency, SUV max of 2.6. Otherwise no focal FDG uptake. Incidental CT findings: Unchanged small sclerotic lesions of the left iliac bone, likely bone islands. IMPRESSION: 1. Enlarged and hypermetabolic right lower paratracheal lymph node, compatible with metastatic disease. 2. Mild FDG uptake of the bilateral sacral ala with associated lucency. Concerning for sacral insufficiency fractures. This could be further evaluated with MRI of the pelvis. 3. Stable posttreatment changes of the right upper lobe. 4. Nonspecific small solid 3 mm pulmonary nodule of the left lower lobe, too  small to assess for FDG avidity. Nodules unchanged compared to most recent prior, but new when compared with more remote priors. Recommend attention on follow-up. 5. No evidence of FDG avid metastatic disease in the abdomen or pelvis. 6. Aortic Atherosclerosis (ICD10-I70.0) and Emphysema (ICD10-J43.9). These results will be called to the ordering clinician or representative by the Radiologist Assistant, and communication documented in the PACS or [employer]. Electronically Signed   By: Yetta Glassman M.D.   On: 12/14/2020 10:58   DG Bone Density  Result Date: 01/25/2021 EXAM: DUAL X-RAY ABSORPTIOMETRY (DXA) FOR BONE MINERAL DENSITY IMPRESSION: crr Your patient [place] Sarinana completed a BMD test on 01/25/2021 using the [place] (analysis version: 14.10) manufactured by [employer]. The following summarizes the results of our evaluation. PATIENT BIOGRAPHICAL: Name: Arlyce, Circle Patient ID: [id number] Birth Date: [birth year] Height: 67.2 in. Gender: Female Exam Date: 01/25/2021 Weight: 132.2 lbs. Indications: Advanced Age, arthritis, Caucasian, copd, emphysema, Height Loss, History of Fracture (Adult), hx cervical ca, hypothyroid, Low Body Weight, lung ca, POSTmenopausal, Tobacco User Fractures: coccyx Treatments: GABAPENTIN, levothyroxine, multivitamin, plavix, trelegy ASSESSMENT: The BMD measured at Femur Total Right is 0.522 g/cm2 with a T-score of -3.9. This patient is considered osteoporotic according to [facility]) criteria. Lumbar spine was not utilized due to advanced degenerative changes.The scan quality is limited by exclusion of L-spine. Site Region Measured Measured WHO Young Adult BMD Date       Age      Classification T-score DualFemur Total Right 01/25/2021 73.8 Osteoporosis -3.9 0.522 g/cm2 Left Forearm Radius 33% 01/25/2021 73.8 Osteoporosis -3.7 0.556 g/cm2 World Health Organization [facility]) criteria for post-menopausal, Caucasian Women:  Normal:       T-score at or above -1 SD Osteopenia:   T-score between -1 and -2.5 SD Osteoporosis: T-score at or below -2.5 SD RECOMMENDATIONS: 1. All patients should optimize calcium and vitamin D intake. 2. Consider FDA-approved medical therapies  in postmenopausal women and men aged 62 years and older, based on the following: a. A hip or vertebral (clinical or morphometric) fracture b. T-score < -2.5 at the femoral neck or spine after appropriate evaluation to exclude secondary causes c. Low bone mass (T-score between -1.0 and -2.5 at the femoral neck or spine) and a 10-year probability of a hip fracture > 3% or a 10-year probability of a major osteoporosis-related fracture > 20% based on the [place]-adapted WHO algorithm d. Clinician judgment and/or patient preferences may indicate treatment for people with 10-year fracture probabilities above or below these levels FOLLOW-UP: People with diagnosed cases of osteoporosis or at high risk for fracture should have regular bone mineral density tests. For patients eligible for Medicare, routine testing is allowed once every 2 years. The testing frequency can be increased to one year for patients who have rapidly progressing disease, those who are receiving or discontinuing medical therapy to restore bone mass, or have additional risk factors. I have reviewed this report, and agree with the above findings. [facility] Electronically Signed   By: Franki Cabot M.D.   On: 01/25/2021 15:11   MM 3D SCREEN BREAST BILATERAL  Result Date: 01/26/2021 CLINICAL DATA:  Screening. EXAM: DIGITAL SCREENING BILATERAL MAMMOGRAM WITH TOMOSYNTHESIS AND CAD TECHNIQUE: Bilateral screening digital craniocaudal and mediolateral oblique mammograms were obtained. Bilateral screening digital breast tomosynthesis was performed. The images were evaluated with computer-aided detection. COMPARISON:  None. ACR Breast Density Category c: The breast tissue is heterogeneously dense, which may  obscure small masses. FINDINGS: In the right breast, calcifications warrant further evaluation with magnified views. In the left breast, no findings suspicious for malignancy. IMPRESSION: Further evaluation is suggested for calcifications in the right breast. RECOMMENDATION: Diagnostic mammogram of the right breast. (Code:[id number]) The patient will be contacted regarding the findings, and additional imaging will be scheduled. BI-RADS CATEGORY  0: Incomplete. Need additional imaging evaluation and/or prior mammograms for comparison. Electronically Signed   By: Nolon Nations M.D.   On: 01/26/2021 07:35      ASSESSMENT & PLAN:  1. Primary adenocarcinoma of right lung ([place])   2. Encounter for antineoplastic chemotherapy   3. Malignant neoplasm of overlapping sites of cervix [facility])    Cancer Staging  Cervical cancer ([place]) Staging form: Cervix Uteri, AJCC Version 9 - Clinical: FIGO Stage IIICr (cTX, cN1) - Unsigned  Primary lung adenocarcinoma ([place]) Staging form: Lung, AJCC 7th Edition - Clinical stage from 01/12/2021: T1, N1, M0 - Signed by [name], MD on 01/29/2021   #Stage III lung adenocarcinoma, recurrent  On  concurrent chemotherapy and radiation. Labs are reviewed and discussed with patient. Proceed with cycle 2 weekly carboplatin and taxol.   #Hypertension, better controlled. Continue current BP regimen.   # Locally advanced cervical cancer stage IIIC1 with pelvic nodes involvement. S/p concurrent chemotherapy and radiation followed by brachytherapy.  She is not actively following up with gynecology oncology.  I recommend patient to reestablish care.  # Elevated creatinine. Likely due to decrease hydration. IVF 1L NS over 1 hour today.   All questions were answered. The patient knows to call the clinic with any problems questions or concerns.   Return of visit:  1 week lab MD carboplatin Taxol.   [name], MD, PhD 02/21/2021

## 2021-02-22 ENCOUNTER — Ambulatory Visit
Admission: RE | Admit: 2021-02-22 | Discharge: 2021-02-22 | Disposition: A | Discharge status: Home / Self Care | Payer: Medicare PPO | Source: Ambulatory Visit | Attending: Radiation Oncology | Admitting: Radiation Oncology

## 2021-02-22 DIAGNOSIS — C3491 Malignant neoplasm of unspecified part of right bronchus or lung: Secondary | ICD-10-CM | POA: Diagnosis not present

## 2021-02-22 DIAGNOSIS — Z923 Personal history of irradiation: Secondary | ICD-10-CM | POA: Diagnosis not present

## 2021-02-22 DIAGNOSIS — Z510 Encounter for antineoplastic radiation therapy: Secondary | ICD-10-CM | POA: Diagnosis not present

## 2021-02-22 DIAGNOSIS — I10 Essential (primary) hypertension: Secondary | ICD-10-CM | POA: Diagnosis not present

## 2021-02-22 DIAGNOSIS — C770 Secondary and unspecified malignant neoplasm of lymph nodes of head, face and neck: Secondary | ICD-10-CM | POA: Diagnosis not present

## 2021-02-22 DIAGNOSIS — Z79899 Other long term (current) drug therapy: Secondary | ICD-10-CM | POA: Diagnosis not present

## 2021-02-22 DIAGNOSIS — Z5111 Encounter for antineoplastic chemotherapy: Secondary | ICD-10-CM | POA: Diagnosis not present

## 2021-02-22 NOTE — Telephone Encounter (Signed)
Spoke with the patient ans she is scheduled for a port placement on 03/03/21 with Dr. Lucky Cowboy with the arrival time of 6:45 am. Pre-procedure instructions were discussed and will be mailed.

## 2021-02-23 ENCOUNTER — Ambulatory Visit
Admission: RE | Admit: 2021-02-23 | Discharge: 2021-02-23 | Disposition: A | Discharge status: Home / Self Care | Payer: Medicare PPO | Source: Ambulatory Visit | Attending: Radiation Oncology | Admitting: Radiation Oncology

## 2021-02-23 DIAGNOSIS — Z923 Personal history of irradiation: Secondary | ICD-10-CM | POA: Diagnosis not present

## 2021-02-23 DIAGNOSIS — Z5111 Encounter for antineoplastic chemotherapy: Secondary | ICD-10-CM | POA: Diagnosis not present

## 2021-02-23 DIAGNOSIS — Z510 Encounter for antineoplastic radiation therapy: Secondary | ICD-10-CM | POA: Diagnosis not present

## 2021-02-23 DIAGNOSIS — Z79899 Other long term (current) drug therapy: Secondary | ICD-10-CM | POA: Diagnosis not present

## 2021-02-23 DIAGNOSIS — I10 Essential (primary) hypertension: Secondary | ICD-10-CM | POA: Diagnosis not present

## 2021-02-23 DIAGNOSIS — C3491 Malignant neoplasm of unspecified part of right bronchus or lung: Secondary | ICD-10-CM | POA: Diagnosis not present

## 2021-02-23 DIAGNOSIS — C770 Secondary and unspecified malignant neoplasm of lymph nodes of head, face and neck: Secondary | ICD-10-CM | POA: Diagnosis not present

## 2021-02-24 ENCOUNTER — Ambulatory Visit
Admission: RE | Admit: 2021-02-24 | Discharge: 2021-02-24 | Disposition: A | Discharge status: Home / Self Care | Payer: Medicare PPO | Source: Ambulatory Visit | Attending: Radiation Oncology | Admitting: Radiation Oncology

## 2021-02-24 DIAGNOSIS — C770 Secondary and unspecified malignant neoplasm of lymph nodes of head, face and neck: Secondary | ICD-10-CM | POA: Diagnosis not present

## 2021-02-24 DIAGNOSIS — C3491 Malignant neoplasm of unspecified part of right bronchus or lung: Secondary | ICD-10-CM | POA: Diagnosis not present

## 2021-02-24 DIAGNOSIS — Z510 Encounter for antineoplastic radiation therapy: Secondary | ICD-10-CM | POA: Diagnosis not present

## 2021-02-24 DIAGNOSIS — Z5111 Encounter for antineoplastic chemotherapy: Secondary | ICD-10-CM | POA: Diagnosis not present

## 2021-02-24 DIAGNOSIS — Z79899 Other long term (current) drug therapy: Secondary | ICD-10-CM | POA: Diagnosis not present

## 2021-02-24 DIAGNOSIS — I10 Essential (primary) hypertension: Secondary | ICD-10-CM | POA: Diagnosis not present

## 2021-02-24 DIAGNOSIS — Z923 Personal history of irradiation: Secondary | ICD-10-CM | POA: Diagnosis not present

## 2021-02-25 ENCOUNTER — Ambulatory Visit
Admission: RE | Admit: 2021-02-25 | Discharge: 2021-02-25 | Disposition: A | Discharge status: Home / Self Care | Payer: Medicare PPO | Source: Ambulatory Visit | Attending: Radiation Oncology | Admitting: Radiation Oncology

## 2021-02-25 DIAGNOSIS — Z79899 Other long term (current) drug therapy: Secondary | ICD-10-CM | POA: Diagnosis not present

## 2021-02-25 DIAGNOSIS — Z923 Personal history of irradiation: Secondary | ICD-10-CM | POA: Diagnosis not present

## 2021-02-25 DIAGNOSIS — I10 Essential (primary) hypertension: Secondary | ICD-10-CM | POA: Diagnosis not present

## 2021-02-25 DIAGNOSIS — C3491 Malignant neoplasm of unspecified part of right bronchus or lung: Secondary | ICD-10-CM | POA: Diagnosis not present

## 2021-02-25 DIAGNOSIS — C770 Secondary and unspecified malignant neoplasm of lymph nodes of head, face and neck: Secondary | ICD-10-CM | POA: Diagnosis not present

## 2021-02-25 DIAGNOSIS — Z5111 Encounter for antineoplastic chemotherapy: Secondary | ICD-10-CM | POA: Diagnosis not present

## 2021-02-25 DIAGNOSIS — Z510 Encounter for antineoplastic radiation therapy: Secondary | ICD-10-CM | POA: Diagnosis not present

## 2021-02-28 ENCOUNTER — Inpatient Hospital Stay (HOSPITAL_BASED_OUTPATIENT_CLINIC_OR_DEPARTMENT_OTHER): Payer: Medicare PPO | Admitting: Oncology

## 2021-02-28 ENCOUNTER — Ambulatory Visit
Admission: RE | Admit: 2021-02-28 | Discharge: 2021-02-28 | Disposition: A | Discharge status: Home / Self Care | Payer: Medicare PPO | Source: Ambulatory Visit | Attending: Radiation Oncology | Admitting: Radiation Oncology

## 2021-02-28 ENCOUNTER — Other Ambulatory Visit: Payer: Self-pay

## 2021-02-28 ENCOUNTER — Inpatient Hospital Stay: Payer: Medicare PPO

## 2021-02-28 ENCOUNTER — Encounter: Payer: Self-pay | Admitting: Oncology

## 2021-02-28 VITALS — BP 149/61 | HR 89 | Temp 97.5°F | Resp 18 | Wt 135.9 lb

## 2021-02-28 VITALS — BP 156/64 | HR 92 | Resp 16

## 2021-02-28 DIAGNOSIS — C3491 Malignant neoplasm of unspecified part of right bronchus or lung: Secondary | ICD-10-CM | POA: Diagnosis not present

## 2021-02-28 DIAGNOSIS — Z923 Personal history of irradiation: Secondary | ICD-10-CM | POA: Diagnosis not present

## 2021-02-28 DIAGNOSIS — Z510 Encounter for antineoplastic radiation therapy: Secondary | ICD-10-CM | POA: Diagnosis not present

## 2021-02-28 DIAGNOSIS — C538 Malignant neoplasm of overlapping sites of cervix uteri: Secondary | ICD-10-CM

## 2021-02-28 DIAGNOSIS — C770 Secondary and unspecified malignant neoplasm of lymph nodes of head, face and neck: Secondary | ICD-10-CM | POA: Diagnosis not present

## 2021-02-28 DIAGNOSIS — Z5111 Encounter for antineoplastic chemotherapy: Secondary | ICD-10-CM

## 2021-02-28 DIAGNOSIS — C539 Malignant neoplasm of cervix uteri, unspecified: Secondary | ICD-10-CM

## 2021-02-28 DIAGNOSIS — I10 Essential (primary) hypertension: Secondary | ICD-10-CM | POA: Diagnosis not present

## 2021-02-28 DIAGNOSIS — Z79899 Other long term (current) drug therapy: Secondary | ICD-10-CM | POA: Diagnosis not present

## 2021-02-28 LAB — COMPREHENSIVE METABOLIC PANEL
ALT: 15 U/L (ref 0–44)
AST: 22 U/L (ref 15–41)
Albumin: 3.8 g/dL (ref 3.5–5.0)
Alkaline Phosphatase: 66 U/L (ref 38–126)
Anion gap: 8 (ref 5–15)
BUN: 22 mg/dL (ref 8–23)
CO2: 22 mmol/L (ref 22–32)
Calcium: 9.3 mg/dL (ref 8.9–10.3)
Chloride: 100 mmol/L (ref 98–111)
Creatinine, Ser: 1.07 mg/dL — ABNORMAL HIGH (ref 0.44–1.00)
GFR, Estimated: 55 mL/min — ABNORMAL LOW (ref >60)
Glucose, Bld: 109 mg/dL — ABNORMAL HIGH (ref 70–99)
Potassium: 4.3 mmol/L (ref 3.5–5.1)
Sodium: 130 mmol/L — ABNORMAL LOW (ref 135–145)
Total Bilirubin: 0.6 mg/dL (ref 0.3–1.2)
Total Protein: 7 g/dL (ref 6.5–8.1)

## 2021-02-28 LAB — CBC WITH DIFFERENTIAL/PLATELET
Abs Immature Granulocytes: 0.05 10*3/uL (ref 0.00–0.07)
Basophils Absolute: 0 10*3/uL (ref 0.0–0.1)
Basophils Relative: 0 %
Eosinophils Absolute: 0 10*3/uL (ref 0.0–0.5)
Eosinophils Relative: 1 %
HCT: 31.1 % — ABNORMAL LOW (ref 36.0–46.0)
Hemoglobin: 10.7 g/dL — ABNORMAL LOW (ref 12.0–15.0)
Immature Granulocytes: 1 %
Lymphocytes Relative: 6 %
Lymphs Abs: 0.4 10*3/uL — ABNORMAL LOW (ref 0.7–4.0)
MCH: 33 pg (ref 26.0–34.0)
MCHC: 34.4 g/dL (ref 30.0–36.0)
MCV: 96 fL (ref 80.0–100.0)
Monocytes Absolute: 0.3 10*3/uL (ref 0.1–1.0)
Monocytes Relative: 5 %
Neutro Abs: 5.7 10*3/uL (ref 1.7–7.7)
Neutrophils Relative %: 87 %
Platelets: 289 10*3/uL (ref 150–400)
RBC: 3.24 MIL/uL — ABNORMAL LOW (ref 3.87–5.11)
RDW: 12.6 % (ref 11.5–15.5)
WBC: 6.5 10*3/uL (ref 4.0–10.5)
nRBC: 0 % (ref 0.0–0.2)

## 2021-02-28 MED ORDER — SODIUM CHLORIDE 0.9 % IV SOLN
Freq: Once | INTRAVENOUS | Status: AC
  Filled 2021-02-28: qty 250

## 2021-02-28 MED ORDER — SODIUM CHLORIDE 0.9 % IV SOLN
45.0000 mg/m2 | Freq: Once | INTRAVENOUS | Status: AC
  Administered 2021-02-28: 78 mg via INTRAVENOUS
  Filled 2021-02-28: qty 13

## 2021-02-28 MED ORDER — SODIUM CHLORIDE 0.9 % IV SOLN
140.2000 mg | Freq: Once | INTRAVENOUS | Status: AC
  Administered 2021-02-28: 140 mg via INTRAVENOUS
  Filled 2021-02-28: qty 14

## 2021-02-28 MED ORDER — PALONOSETRON HCL INJECTION 0.25 MG/5ML
0.2500 mg | Freq: Once | INTRAVENOUS | Status: AC
  Administered 2021-02-28: 0.25 mg via INTRAVENOUS
  Filled 2021-02-28: qty 5

## 2021-02-28 MED ORDER — DIPHENHYDRAMINE HCL 50 MG/ML IJ SOLN
50.0000 mg | Freq: Once | INTRAMUSCULAR | Status: AC
  Administered 2021-02-28: 50 mg via INTRAVENOUS
  Filled 2021-02-28: qty 1

## 2021-02-28 MED ORDER — FAMOTIDINE IN NACL 20-0.9 MG/50ML-% IV SOLN
20.0000 mg | Freq: Once | INTRAVENOUS | Status: AC
  Administered 2021-02-28: 20 mg via INTRAVENOUS
  Filled 2021-02-28: qty 50

## 2021-02-28 MED ORDER — SODIUM CHLORIDE 0.9 % IV SOLN
10.0000 mg | Freq: Once | INTRAVENOUS | Status: AC
  Administered 2021-02-28: 10 mg via INTRAVENOUS
  Filled 2021-02-28: qty 10

## 2021-02-28 NOTE — Patient Instructions (Signed)
[facility] [place]   Discharge Instructions: Thank you for choosing [facility] to provide your oncology and hematology care.  If you have a lab appointment with the [facility], please go directly to the [facility] and check in at the registration area.   Wear comfortable clothing and clothing appropriate for easy access to any Portacath or PICC line.   We strive to give you quality time with your provider. You may need to reschedule your appointment if you arrive late (15 or more minutes).  Arriving late affects you and other patients whose appointments are after yours.  Also, if you miss three or more appointments without notifying the office, you may be dismissed from the clinic at the providers discretion.      For prescription refill requests, have your pharmacy contact our office and allow 72 hours for refills to be completed.    Today you received the following chemotherapy and/or immunotherapy agents: Taxol and Carboplatin.      To help prevent nausea and vomiting after your treatment, we encourage you to take your nausea medication as directed.  BELOW ARE SYMPTOMS THAT SHOULD BE REPORTED IMMEDIATELY: *FEVER GREATER THAN 100.4 F (38 C) OR HIGHER *CHILLS OR SWEATING *NAUSEA AND VOMITING THAT IS NOT CONTROLLED WITH YOUR NAUSEA MEDICATION *UNUSUAL SHORTNESS OF BREATH *UNUSUAL BRUISING OR BLEEDING *URINARY PROBLEMS (pain or burning when urinating, or frequent urination) *BOWEL PROBLEMS (unusual diarrhea, constipation, pain near the anus) TENDERNESS IN MOUTH AND THROAT WITH OR WITHOUT PRESENCE OF ULCERS (sore throat, sores in mouth, or a toothache) UNUSUAL RASH, SWELLING OR PAIN  UNUSUAL VAGINAL DISCHARGE OR ITCHING   Items with * indicate a potential emergency and should be followed up as soon as possible or go to the Emergency Department if any problems should occur.  Please show the CHEMOTHERAPY ALERT CARD or IMMUNOTHERAPY ALERT CARD  at check-in to the Emergency Department and triage nurse.  Should you have questions after your visit or need to cancel or reschedule your appointment, please contact [facility]  Dept: [phone]  and follow the prompts.  Office hours are 8:00 a.m. to 4:30 p.m. Monday - Friday. Please note that voicemails left after 4:00 p.m. may not be returned until the following business day.  We are closed weekends and major holidays. You have access to a nurse at all times for urgent questions. Please call the main number to the clinic Dept: [phone] and follow the prompts.  For any non-urgent questions, you may also contact your provider using MyChart. We now offer e-Visits for anyone 74 and older to request care online for non-urgent symptoms. For details visit [email or web address].   Also download the MyChart app! Go to the app store, search "MyChart", open the app, select [place] [facility] [place], and log in with your MyChart username and password.  Due to Covid, a mask is required upon entering the hospital/clinic. If you do not have a mask, one will be given to you upon arrival. For doctor visits, patients may have 1 support person aged 74 or older with them. For treatment visits, patients cannot have anyone with them due to current Covid guidelines and our immunocompromised population.

## 2021-02-28 NOTE — Progress Notes (Signed)
Pt here for follow up. No new concerns voiced.   

## 2021-02-28 NOTE — Progress Notes (Signed)
Hematology/Oncology progress note [phone] [phone]   Patient Care Team: [name], DO as PCP - General (Family Medicine) Clent Jacks, RN as Oncology Nurse Navigator Noreene Filbert, MD as Radiation Oncologist (Radiation Oncology) Telford Nab, RN as Oncology Nurse Navigator  REFERRING PROVIDER: [name], DO  CHIEF COMPLAINTS/REASON FOR VISIT:  Follow up lung cancer treatments  HISTORY OF PRESENTING ILLNESS:  # Cervix cancer Patient has developed postmenopausal vaginal bleeding and discharge.  She was noted to have a friable cervix/2 cm mass of the cervix, concerning for malignancy 12/19/2018 endometrial biopsy showed scattered atypical squamous cells suspicious for malignancy.  Predominantly necrosis with associated inflammation. 01/01/2019 initial cervix biopsy showed at least high-grade squamous intraepithelial lesion.-HPV negative 01/22/2019, repeat vaginal wall biopsy and cervix 9:00 biopsy showed squamous cell carcinoma.    Staging images 12/31/2018 showed fluid distending the endometrial canal and or endometrial thickening to the level of cervix.  No evidence of pathological lymphadenopathy.  Haziness about the lower cervix.  7 mm irregular pulmonary nodule within the right upper lobe, suspicious for possible primary or metastatic malignancy.  Chronic pleural-parenchymal scarring/fibrosis at the lung apices.  Large amount of stool.  No bowel obstruction. 01/08/2019 PET scan showed marked hypermetabolism in the region of the cervix, compatible with the reported history of cervical cancer. Small bilateral pelvic sidewall lymph nodes discernible FDG accumulation, concerning for metastatic disease although neither lymph node is enlarged by CT size criteria. 7 mm nodule in the right upper lobe shows discernible FDG accumulation.  Questionable neoplasm, primary versus metastatic. Emphysema.   #Chronic hearing loss  # Lung nodule, FDG  avid, questionable primary bronchogenic carcinoma versus metastatic cancer.Discussed with radiation oncology.  Her case was also discussed on tumor board.  Consensus reached on finishing concurrent chemoradiation treatments for cervix cancer first. Possible SBRT to lung nodule for presumed primary lung cancer.  # 02/20/2020 - 03/19/2020 concurrent chemotherapy weekly carboplatin and taxol and radiation for treatment of this cervix cancer.  #December 2021 lung nodule,was evaluated by Dr.Oaks. Options of biopsy via transthoracic or endobronchial approach vs surgical resection.  Patient opted to empiric SBRT  Patient was last seen by me on 06/25/2019.  Lost follow-up. She continues to follow with radiation oncology. 11/20/2020, CT chest with contrast showed minimal residual of previously seen right upper lobe nodule.  0.6 x 0.4 cm.  Internal development of mild bandlike radiation fibrosis.  Newly enlarged pretracheal lymph node, measuring 2.5 x 1.7 cm.  Highly concerning for metastatic disease.  Unchanged prominent AP window and the right hilar lymph nodes.  Emphysema and diffuse bilateral bronchial wall thickening.  Coronary artery disease. 12/13/2020, PET scan showed enlarging hypermetabolic right lower paratracheal lymph node, compatible with metastatic disease.  SUV 10.3.  Mild FDG uptake of the bilateral sacral alla with associated lucency.  Concerning for sacral insufficiency fracture.  Stable posttreatment changes of the right upper lobe nodule.  Nonspecific small solid 3 mm pulmonary nodule of the left lower lobe.  2 small to be characterized.  No evidence of FDG avid metastatic disease in the abdomen or pelvis.  Aortic atherosclerosis and emphysema.  Patient reestablish care on 12/27/2020 for lung cancer treatments   #01/12/2021 patient underwent bronchoscopy biopsy by Dr. Patsey Berthold Right paratracheal lymph node fine-needle aspiration is positive for metastatic non-small cell carcinoma, favor  adenocarcinoma of lung origin.  # 02/14/2021, concurrent carboplatin AUC 2 and taxol 33m/m2  INTERVAL HISTORY Mary CZERWINSKIis a 76y.o. female who has above history  reviewed by me today presents for follow up visit for management of possible recurrent lung cancer.  History of cervix cancer.   She tolerates chemotherapy. No nausea vomiting, diarrhea.  + fatigue  Review of Systems  Constitutional:  Positive for fatigue. Negative for appetite change, chills and fever.  HENT:   Positive for hearing loss. Negative for voice change.   Eyes:  Negative for eye problems.  Respiratory:  Negative for chest tightness and cough.   Cardiovascular:  Negative for chest pain.  Gastrointestinal:  Negative for abdominal distention, abdominal pain and blood in stool.  Endocrine: Negative for hot flashes.  Genitourinary:  Negative for difficulty urinating and frequency.   Musculoskeletal:  Negative for arthralgias.  Skin:  Negative for itching and rash.  Neurological:  Negative for extremity weakness.  Hematological:  Negative for adenopathy.  Psychiatric/Behavioral:  Negative for confusion.    MEDICAL HISTORY:  Past Medical History:  Diagnosis Date   Arthritis    Cancer ([place])    Carotid atherosclerosis    Carotid bruit    Cervical cancer (H[facility])    COPD (chronic obstructive pulmonary disease) (HCC)    emphysema   Coronary artery disease    Family history of adverse reaction to anesthesia    paternal grandfather died during surgery-pt unaware what happened   GERD (gastroesophageal reflux disease)    Hyperlipidemia    Hypertension    Hyponatremia 02/27/2019   Hypothyroidism    Infected cat bite 1980s   was hospitalized   Irregular heartbeat    Medical history non-contributory    Peripheral vascular disease ([place])    Tobacco use     SURGICAL HISTORY: Past Surgical History:  Procedure Laterality Date   CAROTID ENDARTERECTOMY Right Oct. 2015   Dr. Lucky Cowboy   CAROTID STENOSIS Left April  2016   carotid stenosis surgery   COLONOSCOPY WITH PROPOFOL N/A 11/08/2016   Procedure: COLONOSCOPY WITH PROPOFOL;  Surgeon: Jonathon Bellows, MD;  Location: [facility] ENDOSCOPY;  Service: Gastroenterology;  Laterality: N/A;   ESOPHAGOGASTRODUODENOSCOPY (EGD) WITH PROPOFOL N/A 11/08/2016   Procedure: ESOPHAGOGASTRODUODENOSCOPY (EGD) WITH PROPOFOL;  Surgeon: Jonathon Bellows, MD;  Location: [facility] ENDOSCOPY;  Service: Gastroenterology;  Laterality: N/A;   FLEXIBLE BRONCHOSCOPY N/A 12/17/2014   Procedure: FLEXIBLE BRONCHOSCOPY;  Surgeon: Wilhelmina Mcardle, MD;  Location: [facility] ORS;  Service: Pulmonary;  Laterality: N/A;   INCISION AND DRAINAGE / EXCISION THYROGLOSSAL CYST  April 2011   VIDEO BRONCHOSCOPY WITH ENDOBRONCHIAL ULTRASOUND N/A 01/12/2021   Procedure: VIDEO BRONCHOSCOPY WITH ENDOBRONCHIAL ULTRASOUND;  Surgeon: Tyler Pita, MD;  Location: [facility] ORS;  Service: Cardiopulmonary;  Laterality: N/A;    SOCIAL HISTORY: Social History   Socioeconomic History   Marital status: Married    Spouse name: Not on file   Number of children: Not on file   Years of education: Not on file   Highest education level: 9th grade  Occupational History   Occupation: retired   Tobacco Use   Smoking status: Every Day    Packs/day: 1.75    Years: 50.00    Pack years: 87.50    Types: Cigarettes   Smokeless tobacco: Never   Tobacco comments:    0.5 PPD 02/10/2021  Vaping Use   Vaping Use: Former  Substance and Sexual Activity   Alcohol use: No    Alcohol/week: 0.0 standard drinks   Drug use: No   Sexual activity: Yes  Other Topics Concern   Not on file  Social History Narrative   Lives at home with  husband   Social Determinants of Radio broadcast assistant Strain: Not on file  Food Insecurity: Not on file  Transportation Needs: Not on file  Physical Activity: Not on file  Stress: Not on file  Social Connections: Not on file  Intimate Partner Violence: Not on file    FAMILY HISTORY: Family  History  Problem Relation Age of Onset   Congestive Heart Failure Mother    Heart disease Mother    Hypertension Mother    Hypertension Sister    Diabetes Sister    Heart disease Sister    Heart disease Maternal Uncle    Heart disease Maternal Grandmother    Stroke Maternal Grandfather    Cancer Brother        liver, lung   Heart disease Brother    Hypertension Brother    Heart attack Brother    COPD Neg Hx    Breast cancer Neg Hx     ALLERGIES:  is allergic to atorvastatin.  MEDICATIONS:  Current Outpatient Medications  Medication Sig Dispense Refill   acetaminophen (TYLENOL) 500 MG tablet Take 500 mg by mouth every 6 (six) hours as needed for moderate pain.     alendronate (FOSAMAX) 70 MG tablet Take 1 tablet (70 mg total) by mouth every 7 (seven) days. Take with a full glass of water on an empty stomach. 12 tablet 3   clopidogrel (PLAVIX) 75 MG tablet Take 1 tablet (75 mg total) by mouth daily. 90 tablet 3   cyclobenzaprine (FLEXERIL) 10 MG tablet TAKE 1 TABLET AT BEDTIME 60 tablet 3   dexamethasone (DECADRON) 4 MG tablet Take 2 tablets (8 mg total) by mouth daily. Take for 2 days after chemotherapy. . 30 tablet 1   ferrous sulfate 325 (65 FE) MG EC tablet Take 1 tablet (325 mg total) by mouth daily with breakfast. 90 tablet 4   Fluticasone-Umeclidin-Vilant (TRELEGY ELLIPTA) 100-62.5-25 MCG/INH AEPB Inhale 1 puff into the lungs daily. 1 each 11   gabapentin (NEURONTIN) 100 MG capsule Take 1 capsule (100 mg total) by mouth 3 (three) times daily. 270 capsule 1   levothyroxine (SYNTHROID) 75 MCG tablet Take 1 tablet (75 mcg total) by mouth daily. 90 tablet 3   lisinopril (ZESTRIL) 20 MG tablet Take 1 tablet (20 mg total) by mouth daily. (Patient taking differently: Take 20 mg by mouth every evening.) 90 tablet 1   montelukast (SINGULAIR) 10 MG tablet Take 1 tablet (10 mg total) by mouth See admin instructions. Take 79m on the day before chemotherapy 30 tablet 0   Multiple Vitamin  (MULTIVITAMIN) tablet Take 1 tablet by mouth daily.     naproxen (NAPROSYN) 500 MG tablet TAKE 1 TABLET TWICE DAILY WITH MEALS 180 tablet 1   omeprazole (PRILOSEC) 20 MG capsule Take 1 capsule (20 mg total) by mouth daily. (Patient taking differently: Take 20 mg by mouth daily before lunch.) 90 capsule 1   ondansetron (ZOFRAN) 8 MG tablet Take 1 tablet (8 mg total) by mouth 2 (two) times daily as needed for refractory nausea / vomiting. Start on day 3 after chemo. 30 tablet 1   vitamin B-12 (CYANOCOBALAMIN) 500 MCG tablet Take 500 mcg by mouth daily.     No current facility-administered medications for this visit.   Facility-Administered Medications Ordered in Other Visits  Medication Dose Route Frequency Provider Last Rate Last Admin   CARBOplatin (PARAPLATIN) 140 mg in sodium chloride 0.9 % 100 mL chemo infusion  140 mg Intravenous Once YEarlie Server  MD       PACLitaxel (TAXOL) 78 mg in sodium chloride 0.9 % 250 mL chemo infusion (</= 49m/m2)  45 mg/m2 (Order-Specific) Intravenous Once YEarlie Server MD         PHYSICAL EXAMINATION: ECOG PERFORMANCE STATUS: 1 - Symptomatic but completely ambulatory Vitals:   02/28/21 0845  BP: (!) 149/61  Pulse: 89  Resp: 18  Temp: (!) 97.5 F (36.4 C)  SpO2: 100%   Filed Weights   02/28/21 0845  Weight: 135 lb 14.4 oz (61.6 kg)    Physical Exam Constitutional:      General: She is not in acute distress. HENT:     Head: Normocephalic and atraumatic.  Eyes:     General: No scleral icterus.    Pupils: Pupils are equal, round, and reactive to light.  Cardiovascular:     Rate and Rhythm: Normal rate and regular rhythm.     Heart sounds: Normal heart sounds.  Pulmonary:     Effort: Pulmonary effort is normal. No respiratory distress.     Breath sounds: No wheezing.     Comments: Decreased breath sound bilaterally  Abdominal:     General: Bowel sounds are normal. There is no distension.     Palpations: Abdomen is soft. There is no mass.      Tenderness: There is no abdominal tenderness.  Musculoskeletal:        General: No deformity. Normal range of motion.     Cervical back: Normal range of motion and neck supple.  Skin:    General: Skin is warm and dry.     Findings: No erythema or rash.  Neurological:     Mental Status: She is alert and oriented to person, place, and time. Mental status is at baseline.     Cranial Nerves: No cranial nerve deficit.     Coordination: Coordination normal.  Psychiatric:        Mood and Affect: Mood normal.    LABORATORY DATA:  I have reviewed the data as listed Lab Results  Component Value Date   WBC 6.5 02/28/2021   HGB 10.7 (L) 02/28/2021   HCT 31.1 (L) 02/28/2021   MCV 96.0 02/28/2021   PLT 289 02/28/2021   Recent Labs    02/14/21 0831 02/21/21 0824 02/28/21 0821  NA 131* 131* 130*  K 4.1 4.2 4.3  CL 99 97* 100  CO2 24 21* 22  GLUCOSE 90 94 109*  BUN 23 31* 22  CREATININE 0.96 1.29* 1.07*  CALCIUM 9.0 9.4 9.3  GFRNONAA >60 44* 55*  PROT 6.8 7.2 7.0  ALBUMIN 3.9 4.1 3.8  AST _0 ALT _1 ALKPHOS 70 72 66  BILITOT 0.6 0.7 0.6    Iron/TIBC/Ferritin/ %Sat    Component Value Date/Time   IRON 55 10/31/2016 1546   TIBC 339 10/31/2016 1546   FERRITIN 18 10/31/2016 1546   IRONPCTSAT 16 10/31/2016 1546      RADIOGRAPHIC STUDIES: I have personally reviewed the radiological images as listed and agreed with the findings in the report. MR Brain W Wo Contrast  Result Date: 02/01/2021 CLINICAL DATA:  Malignant neoplasm of overlapping sites of cervix (HHowe C53.8 (ICD-10-CM). Lung cancer follow up. EXAM: MRI HEAD WITHOUT AND WITH CONTRAST TECHNIQUE: Multiplanar, multiecho pulse sequences of the brain and surrounding structures were obtained without and with intravenous contrast. CONTRAST:  615mGADAVIST GADOBUTROL 1 MMOL/ML IV SOLN COMPARISON:  MRI of the brain August 18, 2004. FINDINGS: Brain: No  acute infarction, hemorrhage, hydrocephalus, extra-axial collection or  mass lesion. Scattered foci of T2 hyperintensity are seen within the white matter of cerebral hemispheres, most likely small vessel ischemia, mildly progressed since prior MRI. A few punctate foci of susceptibility artifact are seen scattered in the right cerebral hemisphere, may represent hemosiderin deposits. No focus of abnormal contrast enhancement to suggest intracranial metastatic disease. Vascular: Normal flow voids. Skull and upper cervical spine: Normal marrow signal. Sinuses/Orbits: Negative. Other: Trace mucosal thickening in the bilateral mastoid cells. Epidermal inclusion cyst measuring 1.4 cm posteriorly in the neck subcutaneous. IMPRESSION: 1. No evidence of intracranial metastatic disease. 2. Mild-to-moderate chronic microvascular ischemic changes of the white. Electronically Signed   By: Pedro Earls M.D.   On: 02/01/2021 13:53   MR Lumbar Spine Wo Contrast  Result Date: 01/21/2021 CLINICAL DATA:  Low back pain, increased fracture risk ?sacral insuffiency fracture on PET scan EXAM: MRI LUMBAR SPINE WITHOUT CONTRAST TECHNIQUE: Multiplanar, multisequence MR imaging of the lumbar spine was performed. No intravenous contrast was administered. COMPARISON:  PET-CT 12/13/2020, x-ray 05/20/2020 FINDINGS: Segmentation: Transitional lumbosacral anatomy with partial sacralization of the L5 segment. Lowest rib-bearing segment is labeled as T12 (axial series 8, image 6). Alignment:  Trace retrolisthesis of L4 on L5. Vertebrae: Mild superior endplate compression fracture of L5 with approximately 15% vertebral body height loss. Mild bone marrow edema within the L5 vertebral body. There is extensive bone marrow edema within the bilateral sacral ala associated with bilateral sacral fractures, likely subacute given findings on previous PET-CT. Remaining vertebral bodies of the lumbar spine are intact with preserved height. No additional fracture. No evidence of discitis. No suspicious marrow  replacing bone lesion. Conus medullaris and cauda equina: Conus extends to the L1 level. Conus and cauda equina appear normal. Paraspinal and other soft tissues: Negative. Disc levels: T12-L1: Minimal disc bulge. Mild bilateral facet arthropathy. No foraminal or canal stenosis. L1-L2: Minimal disc bulge with small left foraminal protrusion. Mild bilateral facet hypertrophy. No foraminal or canal stenosis. L2-L3: Mild annular disc bulge. Mild bilateral facet arthropathy with ligamentum flavum buckling. Findings result in mild canal stenosis. No significant foraminal stenosis. L3-L4: Mild annular disc bulge. Mild bilateral facet arthropathy with ligamentum flavum buckling. Findings result in mild canal stenosis. No significant foraminal stenosis. L4-L5: Retrolisthesis with disc bulge and endplate ridging. Mild-moderate bilateral facet arthropathy with ligamentum flavum buckling. Mild bilateral subarticular recess stenosis without canal stenosis. No significant foraminal stenosis. L5-S1: Transitional level.  No evidence of impingement. IMPRESSION: 1. Transitional lumbosacral anatomy, as above. 2. Subacute-appearing mild superior endplate compression fracture of L5 with approximately 15% vertebral body height loss. 3. Extensive bone marrow edema within the bilateral sacral ala associated with bilateral sacral fractures, likely subacute given findings on previous PET-CT. 4. Mild multilevel degenerative changes of the lumbar spine as described. 5. Mild canal stenosis at L2-3 and L3-4. 6. Mild bilateral subarticular recess stenosis at L4-5. Electronically Signed   By: Davina Poke D.O.   On: 01/21/2021 09:10   NM PET Image Restag (PS) Skull Base To Thigh  Result Date: 12/14/2020 CLINICAL DATA:  Follow-up treatment strategy for non-small cell lung cancer. EXAM: NUCLEAR MEDICINE PET SKULL BASE TO THIGH TECHNIQUE: 7.0 mCi F-18 FDG was injected intravenously. Full-ring PET imaging was performed from the skull base to  thigh after the radiotracer. CT data was obtained and used for attenuation correction and anatomic localization. Fasting blood glucose: 79 mg/dl COMPARISON:  Chest CT dated November 18, 2020 FINDINGS: Mediastinal blood pool  activity: SUV max 2.1 Liver activity: SUV max 2.8 NECK: No hypermetabolic lymph nodes in the neck. Incidental CT findings: none CHEST: Enlarged and hypermetabolic right lower paratracheal lymph node measuring 1.4 cm in short axis on series 3, image 85 with an SUV max of 10.3, unchanged in size when compared with prior in remeasured in similar plane. Unchanged prominent AP window and right hilar lymph nodes which demonstrate no hypermetabolic activity. No additional hypermetabolic lymph nodes seen in the chest. Right upper lobe ground-glass opacities with mild FDG uptake below mediastinal blood pool, likely post radiation change. Tiny solid right upper lobe pulmonary nodule measuring approximately 5 mm on image 81. Pulmonary nodule of the left lower lobe measuring 3 mm on series 3, image 117, unchanged compared to prior, but new when compared with more remote prior exams. Incidental CT findings: Centrilobular emphysema. Three-vessel coronary artery calcifications. Atherosclerotic disease of the thoracic aorta. ABDOMEN/PELVIS: No abnormal hypermetabolic activity within the liver, pancreas, adrenal glands, or spleen. No hypermetabolic lymph nodes in the abdomen or pelvis. Incidental CT findings: Atherosclerotic disease of the thoracic aorta. Calcifications of the liver, likely sequela prior granulomatous infection. SKELETON: Mild diffuse FDG uptake of the bilateral sacral ala with associated lucency, SUV max of 2.6. Otherwise no focal FDG uptake. Incidental CT findings: Unchanged small sclerotic lesions of the left iliac bone, likely bone islands. IMPRESSION: 1. Enlarged and hypermetabolic right lower paratracheal lymph node, compatible with metastatic disease. 2. Mild FDG uptake of the bilateral  sacral ala with associated lucency. Concerning for sacral insufficiency fractures. This could be further evaluated with MRI of the pelvis. 3. Stable posttreatment changes of the right upper lobe. 4. Nonspecific small solid 3 mm pulmonary nodule of the left lower lobe, too small to assess for FDG avidity. Nodules unchanged compared to most recent prior, but new when compared with more remote priors. Recommend attention on follow-up. 5. No evidence of FDG avid metastatic disease in the abdomen or pelvis. 6. Aortic Atherosclerosis (ICD10-I70.0) and Emphysema (ICD10-J43.9). These results will be called to the ordering clinician or representative by the Radiologist Assistant, and communication documented in the PACS or [employer]. Electronically Signed   By: Yetta Glassman M.D.   On: 12/14/2020 10:58   DG Bone Density  Result Date: 01/25/2021 EXAM: DUAL X-RAY ABSORPTIOMETRY (DXA) FOR BONE MINERAL DENSITY IMPRESSION: crr Your patient Mary Hoover completed a BMD test on 01/25/2021 using the [employer] (analysis version: 14.10) manufactured by [employer]. The following summarizes the results of our evaluation. PATIENT BIOGRAPHICAL: Name: Mary Hoover, Mary Hoover Patient ID: [id number] Birth Date: [birth year] Height: 67.2 in. Gender: Female Exam Date: 01/25/2021 Weight: 132.2 lbs. Indications: Advanced Age, arthritis, Caucasian, copd, emphysema, Height Loss, History of Fracture (Adult), hx cervical ca, hypothyroid, Low Body Weight, lung ca, POSTmenopausal, Tobacco User Fractures: coccyx Treatments: GABAPENTIN, levothyroxine, multivitamin, plavix, trelegy ASSESSMENT: The BMD measured at Femur Total Right is 0.522 g/cm2 with a T-score of -3.9. This patient is considered osteoporotic according to [facility]) criteria. Lumbar spine was not utilized due to advanced degenerative changes.The scan quality is limited by exclusion of L-spine. Site Region Measured Measured WHO Young  Adult BMD Date       Age      Classification T-score DualFemur Total Right 01/25/2023 73.8 Osteoporosis -3.9 0.522 g/cm2 Left Forearm Radius 33% 01/25/2021 73.8 Osteoporosis -3.7 0.556 g/cm2 World Health Organization [facility]) criteria for post-menopausal, Caucasian Women: Normal:       T-score at or above -1 SD  Osteopenia:   T-score between -1 and -2.5 SD Osteoporosis: T-score at or below -2.5 SD RECOMMENDATIONS: 1. All patients should optimize calcium and vitamin D intake. 2. Consider FDA-approved medical therapies in postmenopausal women and men aged 75 years and older, based on the following: a. A hip or vertebral (clinical or morphometric) fracture b. T-score < -2.5 at the femoral neck or spine after appropriate evaluation to exclude secondary causes c. Low bone mass (T-score between -1.0 and -2.5 at the femoral neck or spine) and a 10-year probability of a hip fracture > 3% or a 10-year probability of a major osteoporosis-related fracture > 20% based on the [place]-adapted WHO algorithm d. Clinician judgment and/or patient preferences may indicate treatment for people with 10-year fracture probabilities above or below these levels FOLLOW-UP: People with diagnosed cases of osteoporosis or at high risk for fracture should have regular bone mineral density tests. For patients eligible for Medicare, routine testing is allowed once every 2 years. The testing frequency can be increased to one year for patients who have rapidly progressing disease, those who are receiving or discontinuing medical therapy to restore bone mass, or have additional risk factors. I have reviewed this report, and agree with the above findings. [facility] Radiology Electronically Signed   By: Franki Cabot M.D.   On: 01/25/2021 15:11   MM 3D SCREEN BREAST BILATERAL  Result Date: 01/26/2021 CLINICAL DATA:  Screening. EXAM: DIGITAL SCREENING BILATERAL MAMMOGRAM WITH TOMOSYNTHESIS AND CAD TECHNIQUE: Bilateral screening digital craniocaudal and  mediolateral oblique mammograms were obtained. Bilateral screening digital breast tomosynthesis was performed. The images were evaluated with computer-aided detection. COMPARISON:  None. ACR Breast Density Category c: The breast tissue is heterogeneously dense, which may obscure small masses. FINDINGS: In the right breast, calcifications warrant further evaluation with magnified views. In the left breast, no findings suspicious for malignancy. IMPRESSION: Further evaluation is suggested for calcifications in the right breast. RECOMMENDATION: Diagnostic mammogram of the right breast. (Code:[id number]) The patient will be contacted regarding the findings, and additional imaging will be scheduled. BI-RADS CATEGORY  0: Incomplete. Need additional imaging evaluation and/or prior mammograms for comparison. Electronically Signed   By: Nolon Nations M.D.   On: 01/26/2021 07:35      ASSESSMENT & PLAN:  1. Primary adenocarcinoma of right lung ([place])   2. Encounter for antineoplastic chemotherapy   3. Malignant neoplasm of overlapping sites of cervix [facility])    Cancer Staging  Cervical cancer ([place]) Staging form: Cervix Uteri, AJCC Version 9 - Clinical: FIGO Stage IIICr (cTX, cN1) - Unsigned  Primary lung adenocarcinoma ([place]) Staging form: Lung, AJCC 7th Edition - Clinical stage from 01/12/2021: T1, N1, M0 - Signed by [name], MD on 01/29/2021   #Stage III lung adenocarcinoma, recurrent  On concurrent chemotherapy and radiation. Hold for SBP>175 or DBP>100 Proceed with cycle 3 weekly carboplatin and Taxol.   #Hypertension, better controlled. Continue current BP regimen.   # Locally advanced cervical cancer stage IIIC1 with pelvic nodes involvement. S/p concurrent chemotherapy and radiation followed by brachytherapy.  She is not actively following up with gynecology oncology.  I recommend patient to reestablish care.   All questions were answered. The patient knows to call the clinic with any  problems questions or concerns.   Return of visit:  1 week lab MD carboplatin Taxol.   [name], MD, PhD 02/28/2021

## 2021-03-01 ENCOUNTER — Ambulatory Visit
Admission: RE | Admit: 2021-03-01 | Discharge: 2021-03-01 | Disposition: A | Discharge status: Home / Self Care | Payer: Medicare PPO | Source: Ambulatory Visit | Attending: Radiation Oncology | Admitting: Radiation Oncology

## 2021-03-01 DIAGNOSIS — Z923 Personal history of irradiation: Secondary | ICD-10-CM | POA: Diagnosis not present

## 2021-03-01 DIAGNOSIS — Z79899 Other long term (current) drug therapy: Secondary | ICD-10-CM | POA: Diagnosis not present

## 2021-03-01 DIAGNOSIS — C3491 Malignant neoplasm of unspecified part of right bronchus or lung: Secondary | ICD-10-CM | POA: Diagnosis not present

## 2021-03-01 DIAGNOSIS — C770 Secondary and unspecified malignant neoplasm of lymph nodes of head, face and neck: Secondary | ICD-10-CM | POA: Diagnosis not present

## 2021-03-01 DIAGNOSIS — Z510 Encounter for antineoplastic radiation therapy: Secondary | ICD-10-CM | POA: Diagnosis not present

## 2021-03-01 DIAGNOSIS — I10 Essential (primary) hypertension: Secondary | ICD-10-CM | POA: Diagnosis not present

## 2021-03-01 DIAGNOSIS — Z5111 Encounter for antineoplastic chemotherapy: Secondary | ICD-10-CM | POA: Diagnosis not present

## 2021-03-02 ENCOUNTER — Ambulatory Visit
Admission: RE | Admit: 2021-03-02 | Discharge: 2021-03-02 | Disposition: A | Discharge status: Home / Self Care | Payer: Medicare PPO | Source: Ambulatory Visit | Attending: Radiation Oncology | Admitting: Radiation Oncology

## 2021-03-02 ENCOUNTER — Other Ambulatory Visit (INDEPENDENT_AMBULATORY_CARE_PROVIDER_SITE_OTHER): Payer: Self-pay | Admitting: Nurse Practitioner

## 2021-03-02 DIAGNOSIS — Z5111 Encounter for antineoplastic chemotherapy: Secondary | ICD-10-CM | POA: Insufficient documentation

## 2021-03-02 DIAGNOSIS — I10 Essential (primary) hypertension: Secondary | ICD-10-CM | POA: Insufficient documentation

## 2021-03-02 DIAGNOSIS — C539 Malignant neoplasm of cervix uteri, unspecified: Secondary | ICD-10-CM | POA: Insufficient documentation

## 2021-03-02 DIAGNOSIS — Z923 Personal history of irradiation: Secondary | ICD-10-CM | POA: Insufficient documentation

## 2021-03-02 DIAGNOSIS — Z510 Encounter for antineoplastic radiation therapy: Secondary | ICD-10-CM | POA: Insufficient documentation

## 2021-03-02 DIAGNOSIS — Z79899 Other long term (current) drug therapy: Secondary | ICD-10-CM | POA: Diagnosis not present

## 2021-03-02 DIAGNOSIS — D6481 Anemia due to antineoplastic chemotherapy: Secondary | ICD-10-CM | POA: Diagnosis not present

## 2021-03-02 DIAGNOSIS — C770 Secondary and unspecified malignant neoplasm of lymph nodes of head, face and neck: Secondary | ICD-10-CM | POA: Diagnosis not present

## 2021-03-02 DIAGNOSIS — T451X5A Adverse effect of antineoplastic and immunosuppressive drugs, initial encounter: Secondary | ICD-10-CM | POA: Diagnosis not present

## 2021-03-02 DIAGNOSIS — C3491 Malignant neoplasm of unspecified part of right bronchus or lung: Secondary | ICD-10-CM | POA: Insufficient documentation

## 2021-03-02 DIAGNOSIS — C3432 Malignant neoplasm of lower lobe, left bronchus or lung: Secondary | ICD-10-CM | POA: Insufficient documentation

## 2021-03-03 ENCOUNTER — Encounter: Payer: Self-pay | Admitting: Vascular Surgery

## 2021-03-03 ENCOUNTER — Other Ambulatory Visit (INDEPENDENT_AMBULATORY_CARE_PROVIDER_SITE_OTHER): Payer: Self-pay | Admitting: Nurse Practitioner

## 2021-03-03 ENCOUNTER — Other Ambulatory Visit: Payer: Self-pay

## 2021-03-03 ENCOUNTER — Encounter
Admission: RE | Disposition: A | Discharge status: Home / Self Care | Payer: Self-pay | Source: Home / Self Care | Attending: Vascular Surgery

## 2021-03-03 ENCOUNTER — Ambulatory Visit
Admission: RE | Admit: 2021-03-03 | Discharge: 2021-03-03 | Disposition: A | Discharge status: Home / Self Care | Payer: Medicare PPO | Attending: Vascular Surgery | Admitting: Vascular Surgery

## 2021-03-03 ENCOUNTER — Ambulatory Visit: Payer: Medicare PPO

## 2021-03-03 DIAGNOSIS — I10 Essential (primary) hypertension: Secondary | ICD-10-CM | POA: Diagnosis not present

## 2021-03-03 DIAGNOSIS — C3491 Malignant neoplasm of unspecified part of right bronchus or lung: Secondary | ICD-10-CM | POA: Insufficient documentation

## 2021-03-03 DIAGNOSIS — N950 Postmenopausal bleeding: Secondary | ICD-10-CM | POA: Insufficient documentation

## 2021-03-03 DIAGNOSIS — F17210 Nicotine dependence, cigarettes, uncomplicated: Secondary | ICD-10-CM | POA: Diagnosis not present

## 2021-03-03 DIAGNOSIS — C539 Malignant neoplasm of cervix uteri, unspecified: Secondary | ICD-10-CM | POA: Diagnosis not present

## 2021-03-03 DIAGNOSIS — R7989 Other specified abnormal findings of blood chemistry: Secondary | ICD-10-CM | POA: Diagnosis not present

## 2021-03-03 DIAGNOSIS — C3490 Malignant neoplasm of unspecified part of unspecified bronchus or lung: Secondary | ICD-10-CM

## 2021-03-03 HISTORY — PX: PORTA CATH INSERTION: CATH118285

## 2021-03-03 SURGERY — PORTA CATH INSERTION
Anesthesia: Moderate Sedation

## 2021-03-03 MED ORDER — CHLORHEXIDINE GLUCONATE CLOTH 2 % EX PADS
6.0000 | MEDICATED_PAD | Freq: Every day | CUTANEOUS | Status: DC
  Administered 2021-03-03: 6 via TOPICAL

## 2021-03-03 MED ORDER — MIDAZOLAM HCL 2 MG/2ML IJ SOLN
INTRAMUSCULAR | Status: AC
  Filled 2021-03-03: qty 2

## 2021-03-03 MED ORDER — HYDROMORPHONE HCL 1 MG/ML IJ SOLN: 1.0000 mg | Freq: Once | INTRAMUSCULAR | Status: DC | PRN

## 2021-03-03 MED ORDER — FENTANYL CITRATE (PF) 100 MCG/2ML IJ SOLN
INTRAMUSCULAR | Status: AC
  Filled 2021-03-03: qty 2

## 2021-03-03 MED ORDER — FENTANYL CITRATE (PF) 100 MCG/2ML IJ SOLN
INTRAMUSCULAR | Status: DC | PRN
  Administered 2021-03-03: 50 ug via INTRAVENOUS

## 2021-03-03 MED ORDER — DIPHENHYDRAMINE HCL 50 MG/ML IJ SOLN: 50.0000 mg | Freq: Once | INTRAMUSCULAR | Status: DC | PRN

## 2021-03-03 MED ORDER — HEPARIN (PORCINE) IN NACL 1000-0.9 UT/500ML-% IV SOLN
INTRAVENOUS | Status: DC | PRN
  Administered 2021-03-03: 500 mL

## 2021-03-03 MED ORDER — ONDANSETRON HCL 4 MG/2ML IJ SOLN: 4.0000 mg | Freq: Four times a day (QID) | INTRAMUSCULAR | Status: DC | PRN

## 2021-03-03 MED ORDER — MIDAZOLAM HCL 2 MG/ML PO SYRP: 8.0000 mg | ORAL_SOLUTION | Freq: Once | ORAL | Status: DC | PRN

## 2021-03-03 MED ORDER — SODIUM CHLORIDE 0.9 % IV SOLN: INTRAVENOUS | Status: DC

## 2021-03-03 MED ORDER — MIDAZOLAM HCL 2 MG/2ML IJ SOLN
INTRAMUSCULAR | Status: DC | PRN
  Administered 2021-03-03: 2 mg via INTRAVENOUS

## 2021-03-03 MED ORDER — CEFAZOLIN SODIUM-DEXTROSE 2-4 GM/100ML-% IV SOLN
2.0000 g | Freq: Once | INTRAVENOUS | Status: AC
  Administered 2021-03-03: 2 g via INTRAVENOUS

## 2021-03-03 MED ORDER — METHYLPREDNISOLONE SODIUM SUCC 125 MG IJ SOLR: 125.0000 mg | Freq: Once | INTRAMUSCULAR | Status: DC | PRN

## 2021-03-03 MED ORDER — SODIUM CHLORIDE 0.9 % IV SOLN
80.0000 mg | Freq: Once | INTRAVENOUS | Status: AC
  Administered 2021-03-03: 80 mg
  Filled 2021-03-03: qty 80

## 2021-03-03 MED ORDER — FAMOTIDINE 20 MG PO TABS: 40.0000 mg | ORAL_TABLET | Freq: Once | ORAL | Status: DC | PRN

## 2021-03-03 MED ORDER — HEPARIN (PORCINE) IN NACL 1000-0.9 UT/500ML-% IV SOLN
INTRAVENOUS | Status: AC
  Filled 2021-03-03: qty 1000

## 2021-03-03 MED ORDER — LIDOCAINE HCL 2 % IJ SOLN
INTRAMUSCULAR | Status: DC | PRN
  Administered 2021-03-03: 15 mL

## 2021-03-03 SURGICAL SUPPLY — 13 items
COVER PROBE U/S 5X48 (MISCELLANEOUS) ×1 IMPLANT
COVER SURGICAL LIGHT HANDLE (MISCELLANEOUS) ×1 IMPLANT
DERMABOND ADVANCED (GAUZE/BANDAGES/DRESSINGS) ×1
DERMABOND ADVANCED .7 DNX12 (GAUZE/BANDAGES/DRESSINGS) IMPLANT
HANDLE YANKAUER SUCT BULB TIP (MISCELLANEOUS) ×1 IMPLANT
KIT PORT POWER 8FR ISP CVUE (Port) ×1 IMPLANT
PACK ANGIOGRAPHY (CUSTOM PROCEDURE TRAY) ×2 IMPLANT
PENCIL ELECTRO HAND CTR (MISCELLANEOUS) ×1 IMPLANT
SPONGE XRAY 4X4 16PLY STRL (MISCELLANEOUS) ×1 IMPLANT
SUT MNCRL AB 4-0 PS2 18 (SUTURE) ×1 IMPLANT
SUT VIC AB 3-0 SH 27 (SUTURE) ×1
SUT VIC AB 3-0 SH 27X BRD (SUTURE) IMPLANT
TUBING CONNECTING 10 (TUBING) ×2 IMPLANT

## 2021-03-03 NOTE — Op Note (Signed)
°      [facility]       Operative Note  Date: 03/03/2021  Preoperative diagnosis:  1. Lung cancer  Postoperative diagnosis:  Same as above  Procedures: #1. Ultrasound guidance for vascular access to the left internal jugular vein. #2. Fluoroscopic guidance for placement of catheter. #3. Placement of CT compatible Port-A-Cath, left internal jugular vein.  Surgeon: Leotis Pain, MD.   Anesthesia: Local with moderate conscious sedation for approximately 15  minutes using 2 mg of Versed and 50 mcg of Fentanyl  Fluoroscopy time: less than 1 minute  Contrast used: 0  Estimated blood loss: 3 cc  Indication for the procedure:  The patient is a 76 y.o.female with lung cancer.  The patient needs a Port-A-Cath for durable venous access, chemotherapy, lab draws, and CT scans. We are asked to place this. Risks and benefits were discussed and informed consent was obtained.  Description of procedure: The patient was brought to the vascular and interventional radiology suite.  Moderate conscious sedation was administered throughout the procedure during a face to face encounter with the patient with my supervision of the RN administering medicines and monitoring the patient's vital signs, pulse oximetry, telemetry and mental status throughout from the start of the procedure until the patient was taken to the recovery room. The left neck chest and shoulder were sterilely prepped and draped, and a sterile surgical field was created. Ultrasound was used to help visualize a patent left internal jugular vein. This was then accessed under direct ultrasound guidance without difficulty with the Seldinger needle and a permanent image was recorded. A J-wire was placed. After skin nick and dilatation, the peel-away sheath was then placed over the wire. I then anesthetized an area under the clavicle approximately 1-2 fingerbreadths. A transverse incision was created and an inferior pocket was created  with electrocautery and blunt dissection. The port was then brought onto the field, placed into the pocket and secured to the chest wall with 2 Prolene sutures. The catheter was connected to the port and tunneled from the subclavicular incision to the access site. Fluoroscopic guidance was then used to cut the catheter to an appropriate length. The catheter was then placed through the peel-away sheath and the peel-away sheath was removed. The catheter tip was parked in excellent location under fluorocoscopic guidance in the cavoatrial junction. The pocket was then irrigated with antibiotic impregnated saline and the wound was closed with a running 3-0 Vicryl and a 4-0 Monocryl. The access incision was closed with a single 4-0 Monocryl. The Huber needle was used to withdraw blood and flush the port with heparinized saline. Dermabond was then placed as a dressing. The patient tolerated the procedure well and was taken to the recovery room in stable condition.   Leotis Pain 03/03/2021 8:23 AM   This note was created with Dragon Medical transcription system. Any errors in dictation are purely unintentional.

## 2021-03-04 ENCOUNTER — Ambulatory Visit
Admission: RE | Admit: 2021-03-04 | Discharge: 2021-03-04 | Disposition: A | Discharge status: Home / Self Care | Payer: Medicare PPO | Source: Ambulatory Visit | Attending: Radiation Oncology | Admitting: Radiation Oncology

## 2021-03-04 ENCOUNTER — Encounter: Payer: Self-pay | Admitting: *Deleted

## 2021-03-04 DIAGNOSIS — Z5111 Encounter for antineoplastic chemotherapy: Secondary | ICD-10-CM | POA: Diagnosis not present

## 2021-03-04 DIAGNOSIS — C539 Malignant neoplasm of cervix uteri, unspecified: Secondary | ICD-10-CM | POA: Diagnosis not present

## 2021-03-04 DIAGNOSIS — C770 Secondary and unspecified malignant neoplasm of lymph nodes of head, face and neck: Secondary | ICD-10-CM | POA: Diagnosis not present

## 2021-03-04 DIAGNOSIS — T451X5A Adverse effect of antineoplastic and immunosuppressive drugs, initial encounter: Secondary | ICD-10-CM | POA: Diagnosis not present

## 2021-03-04 DIAGNOSIS — I10 Essential (primary) hypertension: Secondary | ICD-10-CM | POA: Diagnosis not present

## 2021-03-04 DIAGNOSIS — C3491 Malignant neoplasm of unspecified part of right bronchus or lung: Secondary | ICD-10-CM | POA: Diagnosis not present

## 2021-03-04 DIAGNOSIS — Z79899 Other long term (current) drug therapy: Secondary | ICD-10-CM | POA: Diagnosis not present

## 2021-03-04 DIAGNOSIS — Z510 Encounter for antineoplastic radiation therapy: Secondary | ICD-10-CM | POA: Diagnosis not present

## 2021-03-04 DIAGNOSIS — C3432 Malignant neoplasm of lower lobe, left bronchus or lung: Secondary | ICD-10-CM | POA: Diagnosis not present

## 2021-03-04 DIAGNOSIS — D6481 Anemia due to antineoplastic chemotherapy: Secondary | ICD-10-CM | POA: Diagnosis not present

## 2021-03-07 ENCOUNTER — Inpatient Hospital Stay: Payer: Medicare PPO | Attending: Oncology

## 2021-03-07 ENCOUNTER — Ambulatory Visit
Admission: RE | Admit: 2021-03-07 | Discharge: 2021-03-07 | Disposition: A | Discharge status: Home / Self Care | Payer: Medicare PPO | Source: Ambulatory Visit | Attending: Radiation Oncology | Admitting: Radiation Oncology

## 2021-03-07 ENCOUNTER — Encounter: Payer: Self-pay | Admitting: Oncology

## 2021-03-07 ENCOUNTER — Other Ambulatory Visit: Payer: Self-pay

## 2021-03-07 ENCOUNTER — Inpatient Hospital Stay (HOSPITAL_BASED_OUTPATIENT_CLINIC_OR_DEPARTMENT_OTHER): Payer: Medicare PPO | Admitting: Oncology

## 2021-03-07 ENCOUNTER — Inpatient Hospital Stay: Payer: Medicare PPO

## 2021-03-07 VITALS — BP 166/60 | HR 94 | Resp 18

## 2021-03-07 VITALS — BP 164/73 | HR 90 | Temp 96.6°F | Resp 18 | Wt 135.2 lb

## 2021-03-07 DIAGNOSIS — C539 Malignant neoplasm of cervix uteri, unspecified: Secondary | ICD-10-CM | POA: Diagnosis not present

## 2021-03-07 DIAGNOSIS — D6481 Anemia due to antineoplastic chemotherapy: Secondary | ICD-10-CM

## 2021-03-07 DIAGNOSIS — Z5111 Encounter for antineoplastic chemotherapy: Secondary | ICD-10-CM | POA: Insufficient documentation

## 2021-03-07 DIAGNOSIS — C538 Malignant neoplasm of overlapping sites of cervix uteri: Secondary | ICD-10-CM

## 2021-03-07 DIAGNOSIS — I10 Essential (primary) hypertension: Secondary | ICD-10-CM | POA: Diagnosis not present

## 2021-03-07 DIAGNOSIS — C3491 Malignant neoplasm of unspecified part of right bronchus or lung: Secondary | ICD-10-CM

## 2021-03-07 DIAGNOSIS — T451X5A Adverse effect of antineoplastic and immunosuppressive drugs, initial encounter: Secondary | ICD-10-CM

## 2021-03-07 DIAGNOSIS — Z510 Encounter for antineoplastic radiation therapy: Secondary | ICD-10-CM | POA: Diagnosis not present

## 2021-03-07 DIAGNOSIS — Z79899 Other long term (current) drug therapy: Secondary | ICD-10-CM | POA: Insufficient documentation

## 2021-03-07 DIAGNOSIS — C3432 Malignant neoplasm of lower lobe, left bronchus or lung: Secondary | ICD-10-CM | POA: Insufficient documentation

## 2021-03-07 DIAGNOSIS — C770 Secondary and unspecified malignant neoplasm of lymph nodes of head, face and neck: Secondary | ICD-10-CM | POA: Diagnosis not present

## 2021-03-07 LAB — COMPREHENSIVE METABOLIC PANEL WITH GFR
ALT: 14 U/L (ref 0–44)
AST: 15 U/L (ref 15–41)
Albumin: 3.5 g/dL (ref 3.5–5.0)
Alkaline Phosphatase: 56 U/L (ref 38–126)
Anion gap: 6 (ref 5–15)
BUN: 25 mg/dL — ABNORMAL HIGH (ref 8–23)
CO2: 24 mmol/L (ref 22–32)
Calcium: 8.5 mg/dL — ABNORMAL LOW (ref 8.9–10.3)
Chloride: 102 mmol/L (ref 98–111)
Creatinine, Ser: 1.03 mg/dL — ABNORMAL HIGH (ref 0.44–1.00)
GFR, Estimated: 57 mL/min — ABNORMAL LOW
Glucose, Bld: 95 mg/dL (ref 70–99)
Potassium: 4 mmol/L (ref 3.5–5.1)
Sodium: 132 mmol/L — ABNORMAL LOW (ref 135–145)
Total Bilirubin: 0.3 mg/dL (ref 0.3–1.2)
Total Protein: 6.6 g/dL (ref 6.5–8.1)

## 2021-03-07 LAB — CBC WITH DIFFERENTIAL/PLATELET
Abs Immature Granulocytes: 0.03 K/uL (ref 0.00–0.07)
Basophils Absolute: 0 K/uL (ref 0.0–0.1)
Basophils Relative: 0 %
Eosinophils Absolute: 0.1 K/uL (ref 0.0–0.5)
Eosinophils Relative: 2 %
HCT: 28 % — ABNORMAL LOW (ref 36.0–46.0)
Hemoglobin: 9.6 g/dL — ABNORMAL LOW (ref 12.0–15.0)
Immature Granulocytes: 1 %
Lymphocytes Relative: 19 %
Lymphs Abs: 0.6 K/uL — ABNORMAL LOW (ref 0.7–4.0)
MCH: 32.7 pg (ref 26.0–34.0)
MCHC: 34.3 g/dL (ref 30.0–36.0)
MCV: 95.2 fL (ref 80.0–100.0)
Monocytes Absolute: 0.4 K/uL (ref 0.1–1.0)
Monocytes Relative: 13 %
Neutro Abs: 2.1 K/uL (ref 1.7–7.7)
Neutrophils Relative %: 65 %
Platelets: 187 K/uL (ref 150–400)
RBC: 2.94 MIL/uL — ABNORMAL LOW (ref 3.87–5.11)
RDW: 13.2 % (ref 11.5–15.5)
WBC: 3.3 K/uL — ABNORMAL LOW (ref 4.0–10.5)
nRBC: 0 % (ref 0.0–0.2)

## 2021-03-07 MED ORDER — PALONOSETRON HCL INJECTION 0.25 MG/5ML
0.2500 mg | Freq: Once | INTRAVENOUS | Status: AC
  Administered 2021-03-07: 0.25 mg via INTRAVENOUS
  Filled 2021-03-07: qty 5

## 2021-03-07 MED ORDER — SODIUM CHLORIDE 0.9 % IV SOLN
143.6000 mg | Freq: Once | INTRAVENOUS | Status: AC
  Administered 2021-03-07: 140 mg via INTRAVENOUS
  Filled 2021-03-07: qty 14

## 2021-03-07 MED ORDER — LORAZEPAM 2 MG/ML IJ SOLN: 0.5000 mg | INTRAMUSCULAR | Status: DC | PRN

## 2021-03-07 MED ORDER — SODIUM CHLORIDE 0.9 % IV SOLN
45.0000 mg/m2 | Freq: Once | INTRAVENOUS | Status: AC
  Administered 2021-03-07: 78 mg via INTRAVENOUS
  Filled 2021-03-07: qty 13

## 2021-03-07 MED ORDER — HEPARIN SOD (PORK) LOCK FLUSH 100 UNIT/ML IV SOLN
500.0000 [IU] | Freq: Once | INTRAVENOUS | Status: AC | PRN
  Administered 2021-03-07: 500 [IU]
  Filled 2021-03-07: qty 5

## 2021-03-07 MED ORDER — SODIUM CHLORIDE 0.9 % IV SOLN
Freq: Once | INTRAVENOUS | Status: AC
  Filled 2021-03-07: qty 250

## 2021-03-07 MED ORDER — CHLORHEXIDINE GLUCONATE 0.12 % MT SOLN: 5.0000 mL | Freq: Two times a day (BID) | OROMUCOSAL | 0 refills | Status: DC

## 2021-03-07 MED ORDER — FAMOTIDINE IN NACL 20-0.9 MG/50ML-% IV SOLN
20.0000 mg | Freq: Once | INTRAVENOUS | Status: AC
  Administered 2021-03-07: 20 mg via INTRAVENOUS
  Filled 2021-03-07: qty 50

## 2021-03-07 MED ORDER — DIPHENHYDRAMINE HCL 50 MG/ML IJ SOLN
50.0000 mg | Freq: Once | INTRAMUSCULAR | Status: AC
  Administered 2021-03-07: 50 mg via INTRAVENOUS
  Filled 2021-03-07: qty 1

## 2021-03-07 MED ORDER — SODIUM CHLORIDE 0.9 % IV SOLN
10.0000 mg | Freq: Once | INTRAVENOUS | Status: AC
  Administered 2021-03-07: 10 mg via INTRAVENOUS
  Filled 2021-03-07: qty 10

## 2021-03-07 NOTE — Progress Notes (Signed)
Pt here for follow up. No new concerns voiced.   

## 2021-03-07 NOTE — Progress Notes (Signed)
Hematology/Oncology progress note [phone] [phone]   Patient Care Team: [name], DO as PCP - General (Family Medicine) Clent Jacks, RN as Oncology Nurse Navigator Noreene Filbert, MD as Radiation Oncologist (Radiation Oncology) Telford Nab, RN as Oncology Nurse Navigator [name], MD as Consulting Physician (Oncology)  REFERRING PROVIDER: [name], DO  CHIEF COMPLAINTS/REASON FOR VISIT:  Follow up lung cancer treatments  HISTORY OF PRESENTING ILLNESS:  # Cervix cancer Patient has developed postmenopausal vaginal bleeding and discharge.  She was noted to have a friable cervix/2 cm mass of the cervix, concerning for malignancy 12/19/2018 endometrial biopsy showed scattered atypical squamous cells suspicious for malignancy.  Predominantly necrosis with associated inflammation. 01/01/2019 initial cervix biopsy showed at least high-grade squamous intraepithelial lesion.-HPV negative 01/22/2019, repeat vaginal wall biopsy and cervix 9:00 biopsy showed squamous cell carcinoma.    Staging images 12/31/2018 showed fluid distending the endometrial canal and or endometrial thickening to the level of cervix.  No evidence of pathological lymphadenopathy.  Haziness about the lower cervix.  7 mm irregular pulmonary nodule within the right upper lobe, suspicious for possible primary or metastatic malignancy.  Chronic pleural-parenchymal scarring/fibrosis at the lung apices.  Large amount of stool.  No bowel obstruction. 01/08/2019 PET scan showed marked hypermetabolism in the region of the cervix, compatible with the reported history of cervical cancer. Small bilateral pelvic sidewall lymph nodes discernible FDG accumulation, concerning for metastatic disease although neither lymph node is enlarged by CT size criteria. 7 mm nodule in the right upper lobe shows discernible FDG accumulation.  Questionable neoplasm, primary versus metastatic. Emphysema.    #Chronic hearing loss  # Lung nodule, FDG avid, questionable primary bronchogenic carcinoma versus metastatic cancer.Discussed with radiation oncology.  Her case was also discussed on tumor board.  Consensus reached on finishing concurrent chemoradiation treatments for cervix cancer first. Possible SBRT to lung nodule for presumed primary lung cancer.  # 02/20/2020 - 03/19/2020 concurrent chemotherapy weekly carboplatin and taxol and radiation for treatment of this cervix cancer.  #December 2021 lung nodule,was evaluated by Dr.Oaks. Options of biopsy via transthoracic or endobronchial approach vs surgical resection.  Patient opted to empiric SBRT  Patient was last seen by me on 06/25/2019.  Lost follow-up. She continues to follow with radiation oncology. 11/20/2020, CT chest with contrast showed minimal residual of previously seen right upper lobe nodule.  0.6 x 0.4 cm.  Internal development of mild bandlike radiation fibrosis.  Newly enlarged pretracheal lymph node, measuring 2.5 x 1.7 cm.  Highly concerning for metastatic disease.  Unchanged prominent AP window and the right hilar lymph nodes.  Emphysema and diffuse bilateral bronchial wall thickening.  Coronary artery disease. 12/13/2020, PET scan showed enlarging hypermetabolic right lower paratracheal lymph node, compatible with metastatic disease.  SUV 10.3.  Mild FDG uptake of the bilateral sacral alla with associated lucency.  Concerning for sacral insufficiency fracture.  Stable posttreatment changes of the right upper lobe nodule.  Nonspecific small solid 3 mm pulmonary nodule of the left lower lobe.  2 small to be characterized.  No evidence of FDG avid metastatic disease in the abdomen or pelvis.  Aortic atherosclerosis and emphysema.  Patient reestablish care on 12/27/2020 for lung cancer treatments   #01/12/2021 patient underwent bronchoscopy biopsy by Dr. Patsey Berthold Right paratracheal lymph node fine-needle aspiration is positive for  metastatic non-small cell carcinoma, favor adenocarcinoma of lung origin.  # 02/14/2021, concurrent carboplatin AUC 2 and taxol 36m/m2  INTERVAL HISTORY Mary LINEBACKis a  76 y.o. female who has above history reviewed by me today presents for follow up visit for management of possible recurrent lung cancer.  History of cervix cancer.   Patient tolerates chemotherapy treatments.  No nausea vomiting diarrhea.  She will go to take her blood pressure medication this morning.  BP is elevated at 164/73. + fatigue  Review of Systems  Constitutional:  Positive for fatigue. Negative for appetite change, chills and fever.  HENT:   Positive for hearing loss. Negative for voice change.   Eyes:  Negative for eye problems.  Respiratory:  Negative for chest tightness and cough.   Cardiovascular:  Negative for chest pain.  Gastrointestinal:  Negative for abdominal distention, abdominal pain and blood in stool.  Endocrine: Negative for hot flashes.  Genitourinary:  Negative for difficulty urinating and frequency.   Musculoskeletal:  Negative for arthralgias.  Skin:  Negative for itching and rash.  Neurological:  Negative for extremity weakness.  Hematological:  Negative for adenopathy.  Psychiatric/Behavioral:  Negative for confusion.    MEDICAL HISTORY:  Past Medical History:  Diagnosis Date   Arthritis    Cancer ([place])    Carotid atherosclerosis    Carotid bruit    Cervical cancer (H[facility])    COPD (chronic obstructive pulmonary disease) (HCC)    emphysema   Coronary artery disease    Family history of adverse reaction to anesthesia    paternal grandfather died during surgery-pt unaware what happened   GERD (gastroesophageal reflux disease)    Hyperlipidemia    Hypertension    Hyponatremia 02/27/2019   Hypothyroidism    Infected cat bite 1980s   was hospitalized   Irregular heartbeat    Medical history non-contributory    Peripheral vascular disease ([place])    Tobacco use     SURGICAL  HISTORY: Past Surgical History:  Procedure Laterality Date   CAROTID ENDARTERECTOMY Right Oct. 2015   Dr. Lucky Cowboy   CAROTID STENOSIS Left April 2016   carotid stenosis surgery   COLONOSCOPY WITH PROPOFOL N/A 11/08/2016   Procedure: COLONOSCOPY WITH PROPOFOL;  Surgeon: Jonathon Bellows, MD;  Location: [facility] ENDOSCOPY;  Service: Gastroenterology;  Laterality: N/A;   ESOPHAGOGASTRODUODENOSCOPY (EGD) WITH PROPOFOL N/A 11/08/2016   Procedure: ESOPHAGOGASTRODUODENOSCOPY (EGD) WITH PROPOFOL;  Surgeon: Jonathon Bellows, MD;  Location: [facility] ENDOSCOPY;  Service: Gastroenterology;  Laterality: N/A;   FLEXIBLE BRONCHOSCOPY N/A 12/17/2014   Procedure: FLEXIBLE BRONCHOSCOPY;  Surgeon: Wilhelmina Mcardle, MD;  Location: [facility] ORS;  Service: Pulmonary;  Laterality: N/A;   INCISION AND DRAINAGE / EXCISION THYROGLOSSAL CYST  April 2011   PORTA CATH INSERTION N/A 03/03/2021   Procedure: PORTA CATH INSERTION;  Surgeon: Algernon Huxley, MD;  Location: [place] [facility];  Service: Cardiovascular;  Laterality: N/A;   VIDEO BRONCHOSCOPY WITH ENDOBRONCHIAL ULTRASOUND N/A 01/12/2021   Procedure: VIDEO BRONCHOSCOPY WITH ENDOBRONCHIAL ULTRASOUND;  Surgeon: Tyler Pita, MD;  Location: [facility] ORS;  Service: Cardiopulmonary;  Laterality: N/A;    SOCIAL HISTORY: Social History   Socioeconomic History   Marital status: Married    Spouse name: Deidre Ala   Number of children: Not on file   Years of education: Not on file   Highest education level: 9th grade  Occupational History   Occupation: retired   Tobacco Use   Smoking status: Every Day    Packs/day: 0.50    Years: 50.00    Pack years: 25.00    Types: Cigarettes   Smokeless tobacco: Never   Tobacco comments:    0.5 PPD  02/10/2021  Vaping Use   Vaping Use: Former  Substance and Sexual Activity   Alcohol use: No    Alcohol/week: 0.0 standard drinks   Drug use: No   Sexual activity: Yes  Other Topics Concern   Not on file  Social History Narrative   Lives at home with  husband   Social Determinants of Health   Financial Resource Strain: Not on file  Food Insecurity: Not on file  Transportation Needs: Not on file  Physical Activity: Not on file  Stress: Not on file  Social Connections: Not on file  Intimate Partner Violence: Not on file    FAMILY HISTORY: Family History  Problem Relation Age of Onset   Congestive Heart Failure Mother    Heart disease Mother    Hypertension Mother    Hypertension Sister    Diabetes Sister    Heart disease Sister    Heart disease Maternal Uncle    Heart disease Maternal Grandmother    Stroke Maternal Grandfather    Cancer Brother        liver, lung   Heart disease Brother    Hypertension Brother    Heart attack Brother    COPD Neg Hx    Breast cancer Neg Hx     ALLERGIES:  is allergic to atorvastatin.  MEDICATIONS:  Current Outpatient Medications  Medication Sig Dispense Refill   acetaminophen (TYLENOL) 500 MG tablet Take 500 mg by mouth every 6 (six) hours as needed for moderate pain.     alendronate (FOSAMAX) 70 MG tablet Take 1 tablet (70 mg total) by mouth every 7 (seven) days. Take with a full glass of water on an empty stomach. 12 tablet 3   chlorhexidine (PERIDEX) 0.12 % solution Use as directed 5 mLs in the mouth or throat 2 (two) times daily. 120 mL 0   clopidogrel (PLAVIX) 75 MG tablet Take 1 tablet (75 mg total) by mouth daily. 90 tablet 3   cyclobenzaprine (FLEXERIL) 10 MG tablet TAKE 1 TABLET AT BEDTIME 60 tablet 3   dexamethasone (DECADRON) 4 MG tablet Take 2 tablets (8 mg total) by mouth daily. Take for 2 days after chemotherapy. . 30 tablet 1   ferrous sulfate 325 (65 FE) MG EC tablet Take 1 tablet (325 mg total) by mouth daily with breakfast. 90 tablet 4   Fluticasone-Umeclidin-Vilant (TRELEGY ELLIPTA) 100-62.5-25 MCG/INH AEPB Inhale 1 puff into the lungs daily. 1 each 11   gabapentin (NEURONTIN) 100 MG capsule Take 1 capsule (100 mg total) by mouth 3 (three) times daily. 270 capsule 1    levothyroxine (SYNTHROID) 75 MCG tablet Take 1 tablet (75 mcg total) by mouth daily. 90 tablet 3   lisinopril (ZESTRIL) 20 MG tablet Take 1 tablet (20 mg total) by mouth daily. (Patient taking differently: Take 20 mg by mouth every evening.) 90 tablet 1   montelukast (SINGULAIR) 10 MG tablet Take 1 tablet (10 mg total) by mouth See admin instructions. Take 58m on the day before chemotherapy 30 tablet 0   Multiple Vitamin (MULTIVITAMIN) tablet Take 1 tablet by mouth daily.     naproxen (NAPROSYN) 500 MG tablet TAKE 1 TABLET TWICE DAILY WITH MEALS 180 tablet 1   omeprazole (PRILOSEC) 20 MG capsule Take 1 capsule (20 mg total) by mouth daily. (Patient taking differently: Take 20 mg by mouth daily before lunch.) 90 capsule 1   ondansetron (ZOFRAN) 8 MG tablet Take 1 tablet (8 mg total) by mouth 2 (two) times daily as needed  for refractory nausea / vomiting. Start on day 3 after chemo. 30 tablet 1   vitamin B-12 (CYANOCOBALAMIN) 500 MCG tablet Take 500 mcg by mouth daily.     No current facility-administered medications for this visit.   Facility-Administered Medications Ordered in Other Visits  Medication Dose Route Frequency Provider Last Rate Last Admin   CARBOplatin (PARAPLATIN) 140 mg in sodium chloride 0.9 % 100 mL chemo infusion  140 mg Intravenous Once [name], MD       LORazepam (ATIVAN) injection 0.5 mg  0.5 mg Intravenous PRN [name], MD         PHYSICAL EXAMINATION: ECOG PERFORMANCE STATUS: 1 - Symptomatic but completely ambulatory Vitals:   03/07/21 0849  BP: (!) 164/73  Pulse: 90  Resp: (!) 100  Temp: (!) 96.6 F (35.9 C)   Filed Weights   03/07/21 0849  Weight: 135 lb 3.2 oz (61.3 kg)    Physical Exam Constitutional:      General: She is not in acute distress. HENT:     Head: Normocephalic and atraumatic.  Eyes:     General: No scleral icterus.    Pupils: Pupils are equal, round, and reactive to light.  Cardiovascular:     Rate and Rhythm: Normal rate and  regular rhythm.     Heart sounds: Normal heart sounds.  Pulmonary:     Effort: Pulmonary effort is normal. No respiratory distress.     Breath sounds: No wheezing.     Comments: Decreased breath sound bilaterally  Abdominal:     General: Bowel sounds are normal. There is no distension.     Palpations: Abdomen is soft. There is no mass.     Tenderness: There is no abdominal tenderness.  Musculoskeletal:        General: No deformity. Normal range of motion.     Cervical back: Normal range of motion and neck supple.  Skin:    General: Skin is warm and dry.     Findings: No erythema or rash.  Neurological:     Mental Status: She is alert and oriented to person, place, and time. Mental status is at baseline.     Cranial Nerves: No cranial nerve deficit.     Coordination: Coordination normal.  Psychiatric:        Mood and Affect: Mood normal.    LABORATORY DATA:  I have reviewed the data as listed Lab Results  Component Value Date   WBC 3.3 (L) 03/07/2021   HGB 9.6 (L) 03/07/2021   HCT 28.0 (L) 03/07/2021   MCV 95.2 03/07/2021   PLT 187 03/07/2021   Recent Labs    02/21/21 0824 02/28/21 0821 03/07/21 0832  NA 131* 130* 132*  K 4.2 4.3 4.0  CL 97* 100 102  CO2 21* 22 24  GLUCOSE 94 109* 95  BUN 31* 22 25*  CREATININE 1.29* 1.07* 1.03*  CALCIUM 9.4 9.3 8.5*  GFRNONAA 44* 55* 57*  PROT 7.2 7.0 6.6  ALBUMIN 4.1 3.8 3.5  AST _0 ALT _1 ALKPHOS 72 66 56  BILITOT 0.7 0.6 0.3    Iron/TIBC/Ferritin/ %Sat    Component Value Date/Time   IRON 55 10/31/2016 1546   TIBC 339 10/31/2016 1546   FERRITIN 18 10/31/2016 1546   IRONPCTSAT 16 10/31/2016 1546      RADIOGRAPHIC STUDIES: I have personally reviewed the radiological images as listed and agreed with the findings in the report. MR Brain W Wo Contrast  Result Date: 02/01/2021 CLINICAL DATA:  Malignant neoplasm of overlapping sites of cervix ([place]) C53.8 (ICD-10-CM). Lung cancer follow up. EXAM: MRI HEAD  WITHOUT AND WITH CONTRAST TECHNIQUE: Multiplanar, multiecho pulse sequences of the brain and surrounding structures were obtained without and with intravenous contrast. CONTRAST:  26m GADAVIST GADOBUTROL 1 MMOL/ML IV SOLN COMPARISON:  MRI of the brain August 18, 2004. FINDINGS: Brain: No acute infarction, hemorrhage, hydrocephalus, extra-axial collection or mass lesion. Scattered foci of T2 hyperintensity are seen within the white matter of cerebral hemispheres, most likely small vessel ischemia, mildly progressed since prior MRI. A few punctate foci of susceptibility artifact are seen scattered in the right cerebral hemisphere, may represent hemosiderin deposits. No focus of abnormal contrast enhancement to suggest intracranial metastatic disease. Vascular: Normal flow voids. Skull and upper cervical spine: Normal marrow signal. Sinuses/Orbits: Negative. Other: Trace mucosal thickening in the bilateral mastoid cells. Epidermal inclusion cyst measuring 1.4 cm posteriorly in the neck subcutaneous. IMPRESSION: 1. No evidence of intracranial metastatic disease. 2. Mild-to-moderate chronic microvascular ischemic changes of the white. Electronically Signed   By: KPedro EarlsM.D.   On: 02/01/2021 13:53   MR Lumbar Spine Wo Contrast  Result Date: 01/21/2021 CLINICAL DATA:  Low back pain, increased fracture risk ?sacral insuffiency fracture on PET scan EXAM: MRI LUMBAR SPINE WITHOUT CONTRAST TECHNIQUE: Multiplanar, multisequence MR imaging of the lumbar spine was performed. No intravenous contrast was administered. COMPARISON:  PET-CT 12/13/2020, x-ray 05/20/2020 FINDINGS: Segmentation: Transitional lumbosacral anatomy with partial sacralization of the L5 segment. Lowest rib-bearing segment is labeled as T12 (axial series 8, image 6). Alignment:  Trace retrolisthesis of L4 on L5. Vertebrae: Mild superior endplate compression fracture of L5 with approximately 15% vertebral body height loss. Mild bone  marrow edema within the L5 vertebral body. There is extensive bone marrow edema within the bilateral sacral ala associated with bilateral sacral fractures, likely subacute given findings on previous PET-CT. Remaining vertebral bodies of the lumbar spine are intact with preserved height. No additional fracture. No evidence of discitis. No suspicious marrow replacing bone lesion. Conus medullaris and cauda equina: Conus extends to the L1 level. Conus and cauda equina appear normal. Paraspinal and other soft tissues: Negative. Disc levels: T12-L1: Minimal disc bulge. Mild bilateral facet arthropathy. No foraminal or canal stenosis. L1-L2: Minimal disc bulge with small left foraminal protrusion. Mild bilateral facet hypertrophy. No foraminal or canal stenosis. L2-L3: Mild annular disc bulge. Mild bilateral facet arthropathy with ligamentum flavum buckling. Findings result in mild canal stenosis. No significant foraminal stenosis. L3-L4: Mild annular disc bulge. Mild bilateral facet arthropathy with ligamentum flavum buckling. Findings result in mild canal stenosis. No significant foraminal stenosis. L4-L5: Retrolisthesis with disc bulge and endplate ridging. Mild-moderate bilateral facet arthropathy with ligamentum flavum buckling. Mild bilateral subarticular recess stenosis without canal stenosis. No significant foraminal stenosis. L5-S1: Transitional level.  No evidence of impingement. IMPRESSION: 1. Transitional lumbosacral anatomy, as above. 2. Subacute-appearing mild superior endplate compression fracture of L5 with approximately 15% vertebral body height loss. 3. Extensive bone marrow edema within the bilateral sacral ala associated with bilateral sacral fractures, likely subacute given findings on previous PET-CT. 4. Mild multilevel degenerative changes of the lumbar spine as described. 5. Mild canal stenosis at L2-3 and L3-4. 6. Mild bilateral subarticular recess stenosis at L4-5. Electronically Signed   By:  NDavina PokeD.O.   On: 01/21/2021 09:10   PERIPHERAL VASCULAR CATHETERIZATION  Result Date: 03/03/2021 See surgical note for result.  NM PET Image Restag (  PS) Skull Base To Thigh  Result Date: 12/14/2020 CLINICAL DATA:  Follow-up treatment strategy for non-small cell lung cancer. EXAM: NUCLEAR MEDICINE PET SKULL BASE TO THIGH TECHNIQUE: 7.0 mCi F-18 FDG was injected intravenously. Full-ring PET imaging was performed from the skull base to thigh after the radiotracer. CT data was obtained and used for attenuation correction and anatomic localization. Fasting blood glucose: 79 mg/dl COMPARISON:  Chest CT dated November 18, 2020 FINDINGS: Mediastinal blood pool activity: SUV max 2.1 Liver activity: SUV max 2.8 NECK: No hypermetabolic lymph nodes in the neck. Incidental CT findings: none CHEST: Enlarged and hypermetabolic right lower paratracheal lymph node measuring 1.4 cm in short axis on series 3, image 85 with an SUV max of 10.3, unchanged in size when compared with prior in remeasured in similar plane. Unchanged prominent AP window and right hilar lymph nodes which demonstrate no hypermetabolic activity. No additional hypermetabolic lymph nodes seen in the chest. Right upper lobe ground-glass opacities with mild FDG uptake below mediastinal blood pool, likely post radiation change. Tiny solid right upper lobe pulmonary nodule measuring approximately 5 mm on image 81. Pulmonary nodule of the left lower lobe measuring 3 mm on series 3, image 117, unchanged compared to prior, but new when compared with more remote prior exams. Incidental CT findings: Centrilobular emphysema. Three-vessel coronary artery calcifications. Atherosclerotic disease of the thoracic aorta. ABDOMEN/PELVIS: No abnormal hypermetabolic activity within the liver, pancreas, adrenal glands, or spleen. No hypermetabolic lymph nodes in the abdomen or pelvis. Incidental CT findings: Atherosclerotic disease of the thoracic aorta.  Calcifications of the liver, likely sequela prior granulomatous infection. SKELETON: Mild diffuse FDG uptake of the bilateral sacral ala with associated lucency, SUV max of 2.6. Otherwise no focal FDG uptake. Incidental CT findings: Unchanged small sclerotic lesions of the left iliac bone, likely bone islands. IMPRESSION: 1. Enlarged and hypermetabolic right lower paratracheal lymph node, compatible with metastatic disease. 2. Mild FDG uptake of the bilateral sacral ala with associated lucency. Concerning for sacral insufficiency fractures. This could be further evaluated with MRI of the pelvis. 3. Stable posttreatment changes of the right upper lobe. 4. Nonspecific small solid 3 mm pulmonary nodule of the left lower lobe, too small to assess for FDG avidity. Nodules unchanged compared to most recent prior, but new when compared with more remote priors. Recommend attention on follow-up. 5. No evidence of FDG avid metastatic disease in the abdomen or pelvis. 6. Aortic Atherosclerosis (ICD10-I70.0) and Emphysema (ICD10-J43.9). These results will be called to the ordering clinician or representative by the Radiologist Assistant, and communication documented in the PACS or [employer]. Electronically Signed   By: Yetta Glassman M.D.   On: 12/14/2020 10:58   DG Bone Density  Result Date: 01/25/2021 EXAM: DUAL X-RAY ABSORPTIOMETRY (DXA) FOR BONE MINERAL DENSITY IMPRESSION: crr Your patient Nikala Walsworth completed a BMD test on 01/25/2021 using the [place] (analysis version: 14.10) manufactured by [employer]. The following summarizes the results of our evaluation. PATIENT BIOGRAPHICAL: Name: Denia, Mcvicar Patient ID: [id number] Birth Date: 1945-02-07 Height: 67.2 in. Gender: Female Exam Date: 01/25/2021 Weight: 132.2 lbs. Indications: Advanced Age, arthritis, Caucasian, copd, emphysema, Height Loss, History of Fracture (Adult), hx cervical ca, hypothyroid, Low Body Weight, lung  ca, POSTmenopausal, Tobacco User Fractures: coccyx Treatments: GABAPENTIN, levothyroxine, multivitamin, plavix, trelegy ASSESSMENT: The BMD measured at Femur Total Right is 0.522 g/cm2 with a T-score of -3.9. This patient is considered osteoporotic according to [facility]) criteria. Lumbar spine was  not utilized due to advanced degenerative changes.The scan quality is limited by exclusion of L-spine. Site Region Measured Measured WHO Young Adult BMD Date       Age      Classification T-score DualFemur Total Right 01/25/[age] 73.8 Osteoporosis -3.9 0.522 g/cm2 Left Forearm Radius 33% 01/25/2021 73.8 Osteoporosis -3.7 0.556 g/cm2 World Health Organization [facility]) criteria for post-menopausal, Caucasian Women: Normal:       T-score at or above -1 SD Osteopenia:   T-score between -1 and -2.5 SD Osteoporosis: T-score at or below -2.5 SD RECOMMENDATIONS: 1. All patients should optimize calcium and vitamin D intake. 2. Consider FDA-approved medical therapies in postmenopausal women and men aged 28 years and older, based on the following: a. A hip or vertebral (clinical or morphometric) fracture b. T-score < -2.5 at the femoral neck or spine after appropriate evaluation to exclude secondary causes c. Low bone mass (T-score between -1.0 and -2.5 at the femoral neck or spine) and a 10-year probability of a hip fracture > 3% or a 10-year probability of a major osteoporosis-related fracture > 20% based on the [place]-adapted WHO algorithm d. Clinician judgment and/or patient preferences may indicate treatment for people with 10-year fracture probabilities above or below these levels FOLLOW-UP: People with diagnosed cases of osteoporosis or at high risk for fracture should have regular bone mineral density tests. For patients eligible for Medicare, routine testing is allowed once every 2 years. The testing frequency can be increased to one year for patients who have rapidly progressing disease, those who are  receiving or discontinuing medical therapy to restore bone mass, or have additional risk factors. I have reviewed this report, and agree with the above findings. [facility] Electronically Signed   By: Franki Cabot M.D.   On: 01/25/2021 15:11   MM 3D SCREEN BREAST BILATERAL  Result Date: 01/26/2021 CLINICAL DATA:  Screening. EXAM: DIGITAL SCREENING BILATERAL MAMMOGRAM WITH TOMOSYNTHESIS AND CAD TECHNIQUE: Bilateral screening digital craniocaudal and mediolateral oblique mammograms were obtained. Bilateral screening digital breast tomosynthesis was performed. The images were evaluated with computer-aided detection. COMPARISON:  None. ACR Breast Density Category c: The breast tissue is heterogeneously dense, which may obscure small masses. FINDINGS: In the right breast, calcifications warrant further evaluation with magnified views. In the left breast, no findings suspicious for malignancy. IMPRESSION: Further evaluation is suggested for calcifications in the right breast. RECOMMENDATION: Diagnostic mammogram of the right breast. (Code:[id number]) The patient will be contacted regarding the findings, and additional imaging will be scheduled. BI-RADS CATEGORY  0: Incomplete. Need additional imaging evaluation and/or prior mammograms for comparison. Electronically Signed   By: Nolon Nations M.D.   On: 01/26/2021 07:35      ASSESSMENT & PLAN:  1. Primary adenocarcinoma of right lung ([place])   2. Encounter for antineoplastic chemotherapy   3. Malignant neoplasm of overlapping sites of cervix ([place])   4. Anemia due to antineoplastic chemotherapy    Cancer Staging  Cervical cancer ([place]) Staging form: Cervix Uteri, AJCC Version 9 - Clinical: FIGO Stage IIICr (cTX, cN1) - Unsigned  Primary lung adenocarcinoma ([place]) Staging form: Lung, AJCC 7th Edition - Clinical stage from 01/12/2021: T1, N1, M0 - Signed by [name], MD on 01/29/2021   #Stage III lung adenocarcinoma, recurrent  On concurrent  chemotherapy and radiation. Labs are reviewed and discussed with patient.  Proceed with weekly carboplatin and Taxol  #Hypertension, Continue current BP regimen.  #Chemotherapy-induced, hemoglobin has decreased which is anticipated.  Close monitor.  # Locally advanced  cervical cancer stage IIIC1 with pelvic nodes involvement. S/p concurrent chemotherapy and radiation followed by brachytherapy.  She is not actively following up with gynecology oncology.  I recommend patient to reestablish care.   All questions were answered. The patient knows to call the clinic with any problems questions or concerns.   Return of visit:  1 week lab MD carboplatin Taxol.   [name], MD, PhD 03/07/2021

## 2021-03-07 NOTE — Progress Notes (Signed)
Labs and vitals reviewed by MD. OK to proceed with treatment today.  Per Dr. Tasia Catchings, run Taxol at 125 ml/hr because of past history with elevated BP during Taxol administration.

## 2021-03-07 NOTE — Patient Instructions (Signed)
[facility] [place]  Discharge Instructions: ?Thank you for choosing [facility] to provide your oncology and hematology care.  ?If you have a lab appointment with the Cancer Center, please go directly to the Cancer Center and check in at the registration area. ? ?Wear comfortable clothing and clothing appropriate for easy access to any Portacath or PICC line.  ? ?We strive to give you quality time with your provider. You may need to reschedule your appointment if you arrive late (15 or more minutes).  Arriving late affects you and other patients whose appointments are after yours.  Also, if you miss three or more appointments without notifying the office, you may be dismissed from the clinic at the provider?s discretion.    ?  ?For prescription refill requests, have your pharmacy contact our office and allow 72 hours for refills to be completed.   ? ?  ?To help prevent nausea and vomiting after your treatment, we encourage you to take your nausea medication as directed. ? ?BELOW ARE SYMPTOMS THAT SHOULD BE REPORTED IMMEDIATELY: ?*FEVER GREATER THAN 100.4 F (38 ?C) OR HIGHER ?*CHILLS OR SWEATING ?*NAUSEA AND VOMITING THAT IS NOT CONTROLLED WITH YOUR NAUSEA MEDICATION ?*UNUSUAL SHORTNESS OF BREATH ?*UNUSUAL BRUISING OR BLEEDING ?*URINARY PROBLEMS (pain or burning when urinating, or frequent urination) ?*BOWEL PROBLEMS (unusual diarrhea, constipation, pain near the anus) ?TENDERNESS IN MOUTH AND THROAT WITH OR WITHOUT PRESENCE OF ULCERS (sore throat, sores in mouth, or a toothache) ?UNUSUAL RASH, SWELLING OR PAIN  ?UNUSUAL VAGINAL DISCHARGE OR ITCHING  ? ?Items with * indicate a potential emergency and should be followed up as soon as possible or go to the Emergency Department if any problems should occur. ? ?Please show the CHEMOTHERAPY ALERT CARD or IMMUNOTHERAPY ALERT CARD at check-in to the Emergency Department and triage nurse. ? ?Should you have questions after your visit  or need to cancel or reschedule your appointment, please contact [facility] [place]  [phone] and follow the prompts.  Office hours are 8:00 a.m. to 4:30 p.m. Monday - Friday. Please note that voicemails left after 4:00 p.m. may not be returned until the following business day.  We are closed weekends and major holidays. You have access to a nurse at all times for urgent questions. Please call the main number to the clinic [phone] and follow the prompts. ? ?For any non-urgent questions, you may also contact your provider using MyChart. We now offer e-Visits for anyone 18 and older to request care online for non-urgent symptoms. For details visit [email or web address]. ?  ?Also download the MyChart app! Go to the app store, search "MyChart", open the app, select [facility], and log in with your MyChart username and password. ? ?Due to Covid, a mask is required upon entering the hospital/clinic. If you do not have a mask, one will be given to you upon arrival. For doctor visits, patients may have 1 support person aged 18 or older with them. For treatment visits, patients cannot have anyone with them due to current Covid guidelines and our immunocompromised population.  ?

## 2021-03-08 ENCOUNTER — Ambulatory Visit
Admission: RE | Admit: 2021-03-08 | Discharge: 2021-03-08 | Disposition: A | Discharge status: Home / Self Care | Payer: Medicare PPO | Source: Ambulatory Visit | Attending: Radiation Oncology | Admitting: Radiation Oncology

## 2021-03-08 DIAGNOSIS — C3432 Malignant neoplasm of lower lobe, left bronchus or lung: Secondary | ICD-10-CM | POA: Diagnosis not present

## 2021-03-08 DIAGNOSIS — C3491 Malignant neoplasm of unspecified part of right bronchus or lung: Secondary | ICD-10-CM | POA: Diagnosis not present

## 2021-03-08 DIAGNOSIS — C770 Secondary and unspecified malignant neoplasm of lymph nodes of head, face and neck: Secondary | ICD-10-CM | POA: Diagnosis not present

## 2021-03-08 DIAGNOSIS — C539 Malignant neoplasm of cervix uteri, unspecified: Secondary | ICD-10-CM | POA: Diagnosis not present

## 2021-03-08 DIAGNOSIS — Z510 Encounter for antineoplastic radiation therapy: Secondary | ICD-10-CM | POA: Diagnosis not present

## 2021-03-08 DIAGNOSIS — T451X5A Adverse effect of antineoplastic and immunosuppressive drugs, initial encounter: Secondary | ICD-10-CM | POA: Diagnosis not present

## 2021-03-08 DIAGNOSIS — Z79899 Other long term (current) drug therapy: Secondary | ICD-10-CM | POA: Diagnosis not present

## 2021-03-08 DIAGNOSIS — D6481 Anemia due to antineoplastic chemotherapy: Secondary | ICD-10-CM | POA: Diagnosis not present

## 2021-03-08 DIAGNOSIS — I10 Essential (primary) hypertension: Secondary | ICD-10-CM | POA: Diagnosis not present

## 2021-03-08 DIAGNOSIS — Z5111 Encounter for antineoplastic chemotherapy: Secondary | ICD-10-CM | POA: Diagnosis not present

## 2021-03-09 ENCOUNTER — Ambulatory Visit
Admission: RE | Admit: 2021-03-09 | Discharge: 2021-03-09 | Disposition: A | Discharge status: Home / Self Care | Payer: Medicare PPO | Source: Ambulatory Visit | Attending: Radiation Oncology | Admitting: Radiation Oncology

## 2021-03-09 DIAGNOSIS — Z5111 Encounter for antineoplastic chemotherapy: Secondary | ICD-10-CM | POA: Diagnosis not present

## 2021-03-09 DIAGNOSIS — Z79899 Other long term (current) drug therapy: Secondary | ICD-10-CM | POA: Diagnosis not present

## 2021-03-09 DIAGNOSIS — C3432 Malignant neoplasm of lower lobe, left bronchus or lung: Secondary | ICD-10-CM | POA: Diagnosis not present

## 2021-03-09 DIAGNOSIS — C770 Secondary and unspecified malignant neoplasm of lymph nodes of head, face and neck: Secondary | ICD-10-CM | POA: Diagnosis not present

## 2021-03-09 DIAGNOSIS — C3491 Malignant neoplasm of unspecified part of right bronchus or lung: Secondary | ICD-10-CM | POA: Diagnosis not present

## 2021-03-09 DIAGNOSIS — D6481 Anemia due to antineoplastic chemotherapy: Secondary | ICD-10-CM | POA: Diagnosis not present

## 2021-03-09 DIAGNOSIS — Z510 Encounter for antineoplastic radiation therapy: Secondary | ICD-10-CM | POA: Diagnosis not present

## 2021-03-09 DIAGNOSIS — C539 Malignant neoplasm of cervix uteri, unspecified: Secondary | ICD-10-CM | POA: Diagnosis not present

## 2021-03-09 DIAGNOSIS — I10 Essential (primary) hypertension: Secondary | ICD-10-CM | POA: Diagnosis not present

## 2021-03-09 DIAGNOSIS — T451X5A Adverse effect of antineoplastic and immunosuppressive drugs, initial encounter: Secondary | ICD-10-CM | POA: Diagnosis not present

## 2021-03-10 ENCOUNTER — Ambulatory Visit: Payer: Medicare PPO

## 2021-03-11 ENCOUNTER — Encounter: Payer: Self-pay | Admitting: Oncology

## 2021-03-11 ENCOUNTER — Ambulatory Visit
Admission: RE | Admit: 2021-03-11 | Discharge: 2021-03-11 | Disposition: A | Discharge status: Home / Self Care | Payer: Medicare PPO | Source: Ambulatory Visit | Attending: Radiation Oncology | Admitting: Radiation Oncology

## 2021-03-11 DIAGNOSIS — T451X5A Adverse effect of antineoplastic and immunosuppressive drugs, initial encounter: Secondary | ICD-10-CM | POA: Diagnosis not present

## 2021-03-11 DIAGNOSIS — C3491 Malignant neoplasm of unspecified part of right bronchus or lung: Secondary | ICD-10-CM | POA: Diagnosis not present

## 2021-03-11 DIAGNOSIS — Z510 Encounter for antineoplastic radiation therapy: Secondary | ICD-10-CM | POA: Diagnosis not present

## 2021-03-11 DIAGNOSIS — I10 Essential (primary) hypertension: Secondary | ICD-10-CM | POA: Diagnosis not present

## 2021-03-11 DIAGNOSIS — C770 Secondary and unspecified malignant neoplasm of lymph nodes of head, face and neck: Secondary | ICD-10-CM | POA: Diagnosis not present

## 2021-03-11 DIAGNOSIS — Z5111 Encounter for antineoplastic chemotherapy: Secondary | ICD-10-CM | POA: Diagnosis not present

## 2021-03-11 DIAGNOSIS — D6481 Anemia due to antineoplastic chemotherapy: Secondary | ICD-10-CM | POA: Diagnosis not present

## 2021-03-11 DIAGNOSIS — C539 Malignant neoplasm of cervix uteri, unspecified: Secondary | ICD-10-CM | POA: Diagnosis not present

## 2021-03-11 DIAGNOSIS — C3432 Malignant neoplasm of lower lobe, left bronchus or lung: Secondary | ICD-10-CM | POA: Diagnosis not present

## 2021-03-11 DIAGNOSIS — Z79899 Other long term (current) drug therapy: Secondary | ICD-10-CM | POA: Diagnosis not present

## 2021-03-14 ENCOUNTER — Ambulatory Visit
Admission: RE | Admit: 2021-03-14 | Discharge: 2021-03-14 | Disposition: A | Discharge status: Home / Self Care | Payer: Medicare PPO | Source: Ambulatory Visit | Attending: Radiation Oncology | Admitting: Radiation Oncology

## 2021-03-14 ENCOUNTER — Inpatient Hospital Stay: Payer: Medicare PPO

## 2021-03-14 ENCOUNTER — Encounter: Payer: Self-pay | Admitting: Oncology

## 2021-03-14 ENCOUNTER — Inpatient Hospital Stay (HOSPITAL_BASED_OUTPATIENT_CLINIC_OR_DEPARTMENT_OTHER): Payer: Medicare PPO | Admitting: Oncology

## 2021-03-14 ENCOUNTER — Other Ambulatory Visit: Payer: Self-pay

## 2021-03-14 VITALS — BP 155/56 | HR 92 | Temp 97.8°F | Resp 18 | Wt 138.9 lb

## 2021-03-14 DIAGNOSIS — Z510 Encounter for antineoplastic radiation therapy: Secondary | ICD-10-CM | POA: Diagnosis not present

## 2021-03-14 DIAGNOSIS — I10 Essential (primary) hypertension: Secondary | ICD-10-CM | POA: Diagnosis not present

## 2021-03-14 DIAGNOSIS — C3432 Malignant neoplasm of lower lobe, left bronchus or lung: Secondary | ICD-10-CM | POA: Diagnosis not present

## 2021-03-14 DIAGNOSIS — Z5111 Encounter for antineoplastic chemotherapy: Secondary | ICD-10-CM

## 2021-03-14 DIAGNOSIS — T451X5A Adverse effect of antineoplastic and immunosuppressive drugs, initial encounter: Secondary | ICD-10-CM | POA: Diagnosis not present

## 2021-03-14 DIAGNOSIS — C539 Malignant neoplasm of cervix uteri, unspecified: Secondary | ICD-10-CM

## 2021-03-14 DIAGNOSIS — C3491 Malignant neoplasm of unspecified part of right bronchus or lung: Secondary | ICD-10-CM

## 2021-03-14 DIAGNOSIS — D6481 Anemia due to antineoplastic chemotherapy: Secondary | ICD-10-CM | POA: Diagnosis not present

## 2021-03-14 DIAGNOSIS — Z79899 Other long term (current) drug therapy: Secondary | ICD-10-CM | POA: Diagnosis not present

## 2021-03-14 DIAGNOSIS — C770 Secondary and unspecified malignant neoplasm of lymph nodes of head, face and neck: Secondary | ICD-10-CM | POA: Diagnosis not present

## 2021-03-14 LAB — CBC WITH DIFFERENTIAL/PLATELET
Abs Immature Granulocytes: 0.1 10*3/uL — ABNORMAL HIGH (ref 0.00–0.07)
Basophils Absolute: 0 10*3/uL (ref 0.0–0.1)
Basophils Relative: 0 %
Eosinophils Absolute: 0 10*3/uL (ref 0.0–0.5)
Eosinophils Relative: 0 %
HCT: 24.5 % — ABNORMAL LOW (ref 36.0–46.0)
Hemoglobin: 8.4 g/dL — ABNORMAL LOW (ref 12.0–15.0)
Immature Granulocytes: 2 %
Lymphocytes Relative: 5 %
Lymphs Abs: 0.2 10*3/uL — ABNORMAL LOW (ref 0.7–4.0)
MCH: 33.1 pg (ref 26.0–34.0)
MCHC: 34.3 g/dL (ref 30.0–36.0)
MCV: 96.5 fL (ref 80.0–100.0)
Monocytes Absolute: 0.2 10*3/uL (ref 0.1–1.0)
Monocytes Relative: 5 %
Neutro Abs: 3.8 10*3/uL (ref 1.7–7.7)
Neutrophils Relative %: 88 %
Platelets: 197 10*3/uL (ref 150–400)
RBC: 2.54 MIL/uL — ABNORMAL LOW (ref 3.87–5.11)
RDW: 13.7 % (ref 11.5–15.5)
WBC: 4.4 10*3/uL (ref 4.0–10.5)
nRBC: 0 % (ref 0.0–0.2)

## 2021-03-14 LAB — COMPREHENSIVE METABOLIC PANEL
ALT: 15 U/L (ref 0–44)
AST: 18 U/L (ref 15–41)
Albumin: 3.2 g/dL — ABNORMAL LOW (ref 3.5–5.0)
Alkaline Phosphatase: 66 U/L (ref 38–126)
Anion gap: 9 (ref 5–15)
BUN: 21 mg/dL (ref 8–23)
CO2: 20 mmol/L — ABNORMAL LOW (ref 22–32)
Calcium: 8.7 mg/dL — ABNORMAL LOW (ref 8.9–10.3)
Chloride: 100 mmol/L (ref 98–111)
Creatinine, Ser: 1.04 mg/dL — ABNORMAL HIGH (ref 0.44–1.00)
GFR, Estimated: 57 mL/min — ABNORMAL LOW (ref >60)
Glucose, Bld: 123 mg/dL — ABNORMAL HIGH (ref 70–99)
Potassium: 4.7 mmol/L (ref 3.5–5.1)
Sodium: 129 mmol/L — ABNORMAL LOW (ref 135–145)
Total Bilirubin: 0.2 mg/dL — ABNORMAL LOW (ref 0.3–1.2)
Total Protein: 6.4 g/dL — ABNORMAL LOW (ref 6.5–8.1)

## 2021-03-14 MED ORDER — SODIUM CHLORIDE 0.9 % IV SOLN
Freq: Once | INTRAVENOUS | Status: AC
  Filled 2021-03-14: qty 250

## 2021-03-14 MED ORDER — DIPHENHYDRAMINE HCL 50 MG/ML IJ SOLN
50.0000 mg | Freq: Once | INTRAMUSCULAR | Status: AC
  Administered 2021-03-14: 50 mg via INTRAVENOUS
  Filled 2021-03-14: qty 1

## 2021-03-14 MED ORDER — SODIUM CHLORIDE 0.9 % IV SOLN
45.0000 mg/m2 | Freq: Once | INTRAVENOUS | Status: AC
  Administered 2021-03-14: 78 mg via INTRAVENOUS
  Filled 2021-03-14: qty 13

## 2021-03-14 MED ORDER — FAMOTIDINE IN NACL 20-0.9 MG/50ML-% IV SOLN
20.0000 mg | Freq: Once | INTRAVENOUS | Status: AC
  Administered 2021-03-14: 20 mg via INTRAVENOUS
  Filled 2021-03-14: qty 50

## 2021-03-14 MED ORDER — PALONOSETRON HCL INJECTION 0.25 MG/5ML
0.2500 mg | Freq: Once | INTRAVENOUS | Status: AC
  Administered 2021-03-14: 0.25 mg via INTRAVENOUS
  Filled 2021-03-14: qty 5

## 2021-03-14 MED ORDER — HEPARIN SOD (PORK) LOCK FLUSH 100 UNIT/ML IV SOLN
500.0000 [IU] | Freq: Once | INTRAVENOUS | Status: AC | PRN
  Administered 2021-03-14: 500 [IU]
  Filled 2021-03-14: qty 5

## 2021-03-14 MED ORDER — SODIUM CHLORIDE 0.9 % IV SOLN
142.8000 mg | Freq: Once | INTRAVENOUS | Status: AC
  Administered 2021-03-14: 140 mg via INTRAVENOUS
  Filled 2021-03-14: qty 14

## 2021-03-14 MED ORDER — SODIUM CHLORIDE 0.9 % IV SOLN
10.0000 mg | Freq: Once | INTRAVENOUS | Status: AC
  Administered 2021-03-14: 10 mg via INTRAVENOUS
  Filled 2021-03-14: qty 10

## 2021-03-14 MED ORDER — LIDOCAINE-PRILOCAINE 2.5-2.5 % EX CREA: 1.0000 "application " | TOPICAL_CREAM | CUTANEOUS | 3 refills | Status: DC | PRN

## 2021-03-14 MED ORDER — SODIUM CHLORIDE 0.9% FLUSH
10.0000 mL | Freq: Once | INTRAVENOUS | Status: AC
  Administered 2021-03-14: 10 mL via INTRAVENOUS
  Filled 2021-03-14: qty 10

## 2021-03-14 NOTE — Patient Instructions (Signed)
[facility] AT [place]  Discharge Instructions: Thank you for choosing [facility] to provide your oncology and hematology care.  If you have a lab appointment with the [facility], please go directly to the [facility] and check in at the registration area.  Wear comfortable clothing and clothing appropriate for easy access to any Portacath or PICC line.   We strive to give you quality time with your provider. You may need to reschedule your appointment if you arrive late (15 or more minutes).  Arriving late affects you and other patients whose appointments are after yours.  Also, if you miss three or more appointments without notifying the office, you may be dismissed from the clinic at the providers discretion.      For prescription refill requests, have your pharmacy contact our office and allow 72 hours for refills to be completed.    Today you received the following chemotherapy and/or immunotherapy agents : Taxol / Carboplatin     To help prevent nausea and vomiting after your treatment, we encourage you to take your nausea medication as directed.  BELOW ARE SYMPTOMS THAT SHOULD BE REPORTED IMMEDIATELY: *FEVER GREATER THAN 100.4 F (38 C) OR HIGHER *CHILLS OR SWEATING *NAUSEA AND VOMITING THAT IS NOT CONTROLLED WITH YOUR NAUSEA MEDICATION *UNUSUAL SHORTNESS OF BREATH *UNUSUAL BRUISING OR BLEEDING *URINARY PROBLEMS (pain or burning when urinating, or frequent urination) *BOWEL PROBLEMS (unusual diarrhea, constipation, pain near the anus) TENDERNESS IN MOUTH AND THROAT WITH OR WITHOUT PRESENCE OF ULCERS (sore throat, sores in mouth, or a toothache) UNUSUAL RASH, SWELLING OR PAIN  UNUSUAL VAGINAL DISCHARGE OR ITCHING   Items with * indicate a potential emergency and should be followed up as soon as possible or go to the Emergency Department if any problems should occur.  Please show the CHEMOTHERAPY ALERT CARD or IMMUNOTHERAPY ALERT CARD at  check-in to the Emergency Department and triage nurse.  Should you have questions after your visit or need to cancel or reschedule your appointment, please contact [facility] [place] AT [place]  [phone] and follow the prompts.  Office hours are 8:00 a.m. to 4:30 p.m. Monday - Friday. Please note that voicemails left after 4:00 p.m. may not be returned until the following business day.  We are closed weekends and major holidays. You have access to a nurse at all times for urgent questions. Please call the main number to the clinic [phone] and follow the prompts.  For any non-urgent questions, you may also contact your provider using MyChart. We now offer e-Visits for anyone 41 and older to request care online for non-urgent symptoms. For details visit [email or web address].   Also download the MyChart app! Go to the app store, search "MyChart", open the app, select [facility], and log in with your MyChart username and password.  Due to Covid, a mask is required upon entering the hospital/clinic. If you do not have a mask, one will be given to you upon arrival. For doctor visits, patients may have 1 support person aged 31 or older with them. For treatment visits, patients cannot have anyone with them due to current Covid guidelines and our immunocompromised population.

## 2021-03-14 NOTE — Progress Notes (Signed)
Pt here for follow up. No new concerns voiced.   

## 2021-03-14 NOTE — Progress Notes (Signed)
Hematology/Oncology progress note [phone] [phone]   Patient Care Team: [name], DO as PCP - General (Family Medicine) Clent Jacks, RN as Oncology Nurse Navigator Noreene Filbert, MD as Radiation Oncologist (Radiation Oncology) Telford Nab, RN as Oncology Nurse Navigator [name], MD as Consulting Physician (Oncology)  REFERRING PROVIDER: [name], DO  CHIEF COMPLAINTS/REASON FOR VISIT:  Follow up lung cancer treatments  HISTORY OF PRESENTING ILLNESS:  # Cervix cancer Patient has developed postmenopausal vaginal bleeding and discharge.  She was noted to have a friable cervix/2 cm mass of the cervix, concerning for malignancy 12/19/2018 endometrial biopsy showed scattered atypical squamous cells suspicious for malignancy.  Predominantly necrosis with associated inflammation. 01/01/2019 initial cervix biopsy showed at least high-grade squamous intraepithelial lesion.-HPV negative 01/22/2019, repeat vaginal wall biopsy and cervix 9:00 biopsy showed squamous cell carcinoma.    Staging images 12/31/2018 showed fluid distending the endometrial canal and or endometrial thickening to the level of cervix.  No evidence of pathological lymphadenopathy.  Haziness about the lower cervix.  7 mm irregular pulmonary nodule within the right upper lobe, suspicious for possible primary or metastatic malignancy.  Chronic pleural-parenchymal scarring/fibrosis at the lung apices.  Large amount of stool.  No bowel obstruction. 01/08/2019 PET scan showed marked hypermetabolism in the region of the cervix, compatible with the reported history of cervical cancer. Small bilateral pelvic sidewall lymph nodes discernible FDG accumulation, concerning for metastatic disease although neither lymph node is enlarged by CT size criteria. 7 mm nodule in the right upper lobe shows discernible FDG accumulation.  Questionable neoplasm, primary versus metastatic. Emphysema.    #Chronic hearing loss  # Lung nodule, FDG avid, questionable primary bronchogenic carcinoma versus metastatic cancer.Discussed with radiation oncology.  Her case was also discussed on tumor board.  Consensus reached on finishing concurrent chemoradiation treatments for cervix cancer first. Possible SBRT to lung nodule for presumed primary lung cancer.  # 02/20/2020 - 03/19/2020 concurrent chemotherapy weekly carboplatin and taxol and radiation for treatment of this cervix cancer.  #December 2021 lung nodule,was evaluated by Dr.Oaks. Options of biopsy via transthoracic or endobronchial approach vs surgical resection.  Patient opted to empiric SBRT  Patient was last seen by me on 06/25/2019.  Lost follow-up. She continues to follow with radiation oncology. 11/20/2020, CT chest with contrast showed minimal residual of previously seen right upper lobe nodule.  0.6 x 0.4 cm.  Internal development of mild bandlike radiation fibrosis.  Newly enlarged pretracheal lymph node, measuring 2.5 x 1.7 cm.  Highly concerning for metastatic disease.  Unchanged prominent AP window and the right hilar lymph nodes.  Emphysema and diffuse bilateral bronchial wall thickening.  Coronary artery disease. 12/13/2020, PET scan showed enlarging hypermetabolic right lower paratracheal lymph node, compatible with metastatic disease.  SUV 10.3.  Mild FDG uptake of the bilateral sacral alla with associated lucency.  Concerning for sacral insufficiency fracture.  Stable posttreatment changes of the right upper lobe nodule.  Nonspecific small solid 3 mm pulmonary nodule of the left lower lobe.  2 small to be characterized.  No evidence of FDG avid metastatic disease in the abdomen or pelvis.  Aortic atherosclerosis and emphysema.  Patient reestablish care on 12/27/2020 for lung cancer treatments   #01/12/2021 patient underwent bronchoscopy biopsy by Dr. Patsey Berthold Right paratracheal lymph node fine-needle aspiration is positive for  metastatic non-small cell carcinoma, favor adenocarcinoma of lung origin.  # 02/14/2021, concurrent carboplatin AUC 2 and taxol 75m/m2  INTERVAL HISTORY JDELESHA POHLMANis a  76 y.o. female who has above history reviewed by me today presents for follow up visit for management of possible recurrent lung cancer.  History of cervix cancer.   Patient tolerates chemotherapy treatments.  No nausea vomiting diarrhea. + fatigue, patient reports not significantly worse than her baseline.  Review of Systems  Constitutional:  Positive for fatigue. Negative for appetite change, chills and fever.  HENT:   Positive for hearing loss. Negative for voice change.   Eyes:  Negative for eye problems.  Respiratory:  Negative for chest tightness and cough.   Cardiovascular:  Negative for chest pain.  Gastrointestinal:  Negative for abdominal distention, abdominal pain and blood in stool.  Endocrine: Negative for hot flashes.  Genitourinary:  Negative for difficulty urinating and frequency.   Musculoskeletal:  Negative for arthralgias.  Skin:  Negative for itching and rash.  Neurological:  Negative for extremity weakness.  Hematological:  Negative for adenopathy.  Psychiatric/Behavioral:  Negative for confusion.    MEDICAL HISTORY:  Past Medical History:  Diagnosis Date   Arthritis    Cancer ([place])    Carotid atherosclerosis    Carotid bruit    Cervical cancer (HCC)    COPD (chronic obstructive pulmonary disease) (HCC)    emphysema   Coronary artery disease    Family history of adverse reaction to anesthesia    paternal grandfather died during surgery-pt unaware what happened   GERD (gastroesophageal reflux disease)    Hyperlipidemia    Hypertension    Hyponatremia 02/27/2019   Hypothyroidism    Infected cat bite 1980s   was hospitalized   Irregular heartbeat    Medical history non-contributory    Peripheral vascular disease ([place])    Tobacco use     SURGICAL HISTORY: Past Surgical History:   Procedure Laterality Date   CAROTID ENDARTERECTOMY Right Oct. 2015   Dr. Lucky Cowboy   CAROTID STENOSIS Left April 2016   carotid stenosis surgery   COLONOSCOPY WITH PROPOFOL N/A 11/08/2016   Procedure: COLONOSCOPY WITH PROPOFOL;  Surgeon: Jonathon Bellows, MD;  Location[facility] ENDOSCOPY;  Service: Gastroenterology;  Laterality: N/A;   ESOPHAGOGASTRODUODENOSCOPY (EGD) WITH PROPOFOL N/A 11/08/2016   Procedure: ESOPHAGOGASTRODUODENOSCOPY (EGD) WITH PROPOFOL;  Surgeon: Jonathon Bellows, MD;  Location: [facility] ENDOSCOPY;  Service: Gastroenterology;  Laterality: N/A;   FLEXIBLE BRONCHOSCOPY N/A 12/17/2014   Procedure: FLEXIBLE BRONCHOSCOPY;  Surgeon: Wilhelmina Mcardle, MD;  Location: [facility] ORS;  Service: Pulmonary;  Laterality: N/A;   INCISION AND DRAINAGE / EXCISION THYROGLOSSAL CYST  April 2011   PORTA CATH INSERTION N/A 03/03/2021   Procedure: PORTA CATH INSERTION;  Surgeon: Algernon Huxley, MD;  Location: [facility];  Service: Cardiovascular;  Laterality: N/A;   VIDEO BRONCHOSCOPY WITH ENDOBRONCHIAL ULTRASOUND N/A 01/12/2021   Procedure: VIDEO BRONCHOSCOPY WITH ENDOBRONCHIAL ULTRASOUND;  Surgeon: Tyler Pita, MD;  Location: [facility] ORS;  Service: Cardiopulmonary;  Laterality: N/A;    SOCIAL HISTORY: Social History   Socioeconomic History   Marital status: Married    Spouse name: Deidre Ala   Number of children: Not on file   Years of education: Not on file   Highest education level: 9th grade  Occupational History   Occupation: retired   Tobacco Use   Smoking status: Every Day    Packs/day: 0.50    Years: 50.00    Pack years: 25.00    Types: Cigarettes   Smokeless tobacco: Never   Tobacco comments:    0.5 PPD 02/10/2021  Vaping Use   Vaping Use: Former  Substance and Sexual Activity   Alcohol use: No    Alcohol/week: 0.0 standard drinks   Drug use: No   Sexual activity: Yes  Other Topics Concern   Not on file  Social History Narrative   Lives at home with husband   Social Determinants of  Health   Financial Resource Strain: Not on file  Food Insecurity: Not on file  Transportation Needs: Not on file  Physical Activity: Not on file  Stress: Not on file  Social Connections: Not on file  Intimate Partner Violence: Not on file    FAMILY HISTORY: Family History  Problem Relation Age of Onset   Congestive Heart Failure Mother    Heart disease Mother    Hypertension Mother    Hypertension Sister    Diabetes Sister    Heart disease Sister    Heart disease Maternal Uncle    Heart disease Maternal Grandmother    Stroke Maternal Grandfather    Cancer Brother        liver, lung   Heart disease Brother    Hypertension Brother    Heart attack Brother    COPD Neg Hx    Breast cancer Neg Hx     ALLERGIES:  is allergic to atorvastatin.  MEDICATIONS:  Current Outpatient Medications  Medication Sig Dispense Refill   acetaminophen (TYLENOL) 500 MG tablet Take 500 mg by mouth every 6 (six) hours as needed for moderate pain.     alendronate (FOSAMAX) 70 MG tablet Take 1 tablet (70 mg total) by mouth every 7 (seven) days. Take with a full glass of water on an empty stomach. 12 tablet 3   chlorhexidine (PERIDEX) 0.12 % solution Use as directed 5 mLs in the mouth or throat 2 (two) times daily. 120 mL 0   clopidogrel (PLAVIX) 75 MG tablet Take 1 tablet (75 mg total) by mouth daily. 90 tablet 3   cyclobenzaprine (FLEXERIL) 10 MG tablet TAKE 1 TABLET AT BEDTIME 60 tablet 3   dexamethasone (DECADRON) 4 MG tablet Take 2 tablets (8 mg total) by mouth daily. Take for 2 days after chemotherapy. . 30 tablet 1   ferrous sulfate 325 (65 FE) MG EC tablet Take 1 tablet (325 mg total) by mouth daily with breakfast. 90 tablet 4   Fluticasone-Umeclidin-Vilant (TRELEGY ELLIPTA) 100-62.5-25 MCG/INH AEPB Inhale 1 puff into the lungs daily. 1 each 11   gabapentin (NEURONTIN) 100 MG capsule Take 1 capsule (100 mg total) by mouth 3 (three) times daily. 270 capsule 1   levothyroxine (SYNTHROID) 75 MCG  tablet Take 1 tablet (75 mcg total) by mouth daily. 90 tablet 3   lidocaine-prilocaine (EMLA) cream Apply 1 application topically as needed. 60 g 3   lisinopril (ZESTRIL) 20 MG tablet Take 1 tablet (20 mg total) by mouth daily. (Patient taking differently: Take 20 mg by mouth every evening.) 90 tablet 1   montelukast (SINGULAIR) 10 MG tablet Take 1 tablet (10 mg total) by mouth See admin instructions. Take 14m on the day before chemotherapy 30 tablet 0   Multiple Vitamin (MULTIVITAMIN) tablet Take 1 tablet by mouth daily.     naproxen (NAPROSYN) 500 MG tablet TAKE 1 TABLET TWICE DAILY WITH MEALS 180 tablet 1   omeprazole (PRILOSEC) 20 MG capsule Take 1 capsule (20 mg total) by mouth daily. (Patient taking differently: Take 20 mg by mouth daily before lunch.) 90 capsule 1   ondansetron (ZOFRAN) 8 MG tablet Take 1 tablet (8 mg total) by mouth 2 (two)  times daily as needed for refractory nausea / vomiting. Start on day 3 after chemo. 30 tablet 1   vitamin B-12 (CYANOCOBALAMIN) 500 MCG tablet Take 500 mcg by mouth daily.     No current facility-administered medications for this visit.     PHYSICAL EXAMINATION: ECOG PERFORMANCE STATUS: 1 - Symptomatic but completely ambulatory Vitals:   03/14/21 0905  BP: (!) 155/56  Pulse: 92  Resp: 18  Temp: 97.8 F (36.6 C)  SpO2: 100%   Filed Weights   03/14/21 0905  Weight: 138 lb 14.4 oz (63 kg)    Physical Exam Constitutional:      General: She is not in acute distress. HENT:     Head: Normocephalic and atraumatic.  Eyes:     General: No scleral icterus.    Pupils: Pupils are equal, round, and reactive to light.  Cardiovascular:     Rate and Rhythm: Normal rate and regular rhythm.     Heart sounds: Normal heart sounds.  Pulmonary:     Effort: Pulmonary effort is normal. No respiratory distress.     Breath sounds: No wheezing.     Comments: Decreased breath sound bilaterally  Abdominal:     General: Bowel sounds are normal. There is no  distension.     Palpations: Abdomen is soft. There is no mass.     Tenderness: There is no abdominal tenderness.  Musculoskeletal:        General: No deformity. Normal range of motion.     Cervical back: Normal range of motion and neck supple.  Skin:    General: Skin is warm and dry.     Findings: No erythema or rash.  Neurological:     Mental Status: She is alert and oriented to person, place, and time. Mental status is at baseline.     Cranial Nerves: No cranial nerve deficit.     Coordination: Coordination normal.  Psychiatric:        Mood and Affect: Mood normal.    LABORATORY DATA:  I have reviewed the data as listed Lab Results  Component Value Date   WBC 4.4 03/14/2021   HGB 8.4 (L) 03/14/2021   HCT 24.5 (L) 03/14/2021   MCV 96.5 03/14/2021   PLT 197 03/14/2021   Recent Labs    02/28/21 0821 03/07/21 0832 03/14/21 0849  NA 130* 132* 129*  K 4.3 4.0 4.7  CL 100 102 100  CO2 22 24 20*  GLUCOSE 109* 95 123*  BUN 22 25* 21  CREATININE 1.07* 1.03* 1.04*  CALCIUM 9.3 8.5* 8.7*  GFRNONAA 55* 57* 57*  PROT 7.0 6.6 6.4*  ALBUMIN 3.8 3.5 3.2*  AST _0 ALT _1 ALKPHOS 66 56 66  BILITOT 0.6 0.3 0.2*    Iron/TIBC/Ferritin/ %Sat    Component Value Date/Time   IRON 55 10/31/2016 1546   TIBC 339 10/31/2016 1546   FERRITIN 18 10/31/2016 1546   IRONPCTSAT 16 10/31/2016 1546      RADIOGRAPHIC STUDIES: I have personally reviewed the radiological images as listed and agreed with the findings in the report. MR Brain W Wo Contrast  Result Date: 02/01/2021 CLINICAL DATA:  Malignant neoplasm of overlapping sites of cervix ([place]) C53.8 (ICD-10-CM). Lung cancer follow up. EXAM: MRI HEAD WITHOUT AND WITH CONTRAST TECHNIQUE: Multiplanar, multiecho pulse sequences of the brain and surrounding structures were obtained without and with intravenous contrast. CONTRAST:  60m GADAVIST GADOBUTROL 1 MMOL/ML IV SOLN COMPARISON:  MRI of the  brain August 18, 2004. FINDINGS:  Brain: No acute infarction, hemorrhage, hydrocephalus, extra-axial collection or mass lesion. Scattered foci of T2 hyperintensity are seen within the white matter of cerebral hemispheres, most likely small vessel ischemia, mildly progressed since prior MRI. A few punctate foci of susceptibility artifact are seen scattered in the right cerebral hemisphere, may represent hemosiderin deposits. No focus of abnormal contrast enhancement to suggest intracranial metastatic disease. Vascular: Normal flow voids. Skull and upper cervical spine: Normal marrow signal. Sinuses/Orbits: Negative. Other: Trace mucosal thickening in the bilateral mastoid cells. Epidermal inclusion cyst measuring 1.4 cm posteriorly in the neck subcutaneous. IMPRESSION: 1. No evidence of intracranial metastatic disease. 2. Mild-to-moderate chronic microvascular ischemic changes of the white. Electronically Signed   By: Pedro Earls M.D.   On: 02/01/2021 13:53   MR Lumbar Spine Wo Contrast  Result Date: 01/21/2021 CLINICAL DATA:  Low back pain, increased fracture risk ?sacral insuffiency fracture on PET scan EXAM: MRI LUMBAR SPINE WITHOUT CONTRAST TECHNIQUE: Multiplanar, multisequence MR imaging of the lumbar spine was performed. No intravenous contrast was administered. COMPARISON:  PET-CT 12/13/2020, x-ray 05/20/2020 FINDINGS: Segmentation: Transitional lumbosacral anatomy with partial sacralization of the L5 segment. Lowest rib-bearing segment is labeled as T12 (axial series 8, image 6). Alignment:  Trace retrolisthesis of L4 on L5. Vertebrae: Mild superior endplate compression fracture of L5 with approximately 15% vertebral body height loss. Mild bone marrow edema within the L5 vertebral body. There is extensive bone marrow edema within the bilateral sacral ala associated with bilateral sacral fractures, likely subacute given findings on previous PET-CT. Remaining vertebral bodies of the lumbar spine are intact with preserved  height. No additional fracture. No evidence of discitis. No suspicious marrow replacing bone lesion. Conus medullaris and cauda equina: Conus extends to the L1 level. Conus and cauda equina appear normal. Paraspinal and other soft tissues: Negative. Disc levels: T12-L1: Minimal disc bulge. Mild bilateral facet arthropathy. No foraminal or canal stenosis. L1-L2: Minimal disc bulge with small left foraminal protrusion. Mild bilateral facet hypertrophy. No foraminal or canal stenosis. L2-L3: Mild annular disc bulge. Mild bilateral facet arthropathy with ligamentum flavum buckling. Findings result in mild canal stenosis. No significant foraminal stenosis. L3-L4: Mild annular disc bulge. Mild bilateral facet arthropathy with ligamentum flavum buckling. Findings result in mild canal stenosis. No significant foraminal stenosis. L4-L5: Retrolisthesis with disc bulge and endplate ridging. Mild-moderate bilateral facet arthropathy with ligamentum flavum buckling. Mild bilateral subarticular recess stenosis without canal stenosis. No significant foraminal stenosis. L5-S1: Transitional level.  No evidence of impingement. IMPRESSION: 1. Transitional lumbosacral anatomy, as above. 2. Subacute-appearing mild superior endplate compression fracture of L5 with approximately 15% vertebral body height loss. 3. Extensive bone marrow edema within the bilateral sacral ala associated with bilateral sacral fractures, likely subacute given findings on previous PET-CT. 4. Mild multilevel degenerative changes of the lumbar spine as described. 5. Mild canal stenosis at L2-3 and L3-4. 6. Mild bilateral subarticular recess stenosis at L4-5. Electronically Signed   By: Davina Poke D.O.   On: 01/21/2021 09:10   PERIPHERAL VASCULAR CATHETERIZATION  Result Date: 03/03/2021 See surgical note for result.  DG Bone Density  Result Date: 01/25/2021 EXAM: DUAL X-RAY ABSORPTIOMETRY (DXA) FOR BONE MINERAL DENSITY IMPRESSION: crr Your patient  Aisia Correira completed a BMD test on 01/25/2021 using the [place] (analysis version: 14.10) manufactured by [employer]. The following summarizes the results of our evaluation. PATIENT BIOGRAPHICAL: Name: Angell, Pincock Patient ID: [id number] Birth Date: [birth year] Height:  67.2 in. Gender: Female Exam Date: 01/25/2021 Weight: 132.2 lbs. Indications: Advanced Age, arthritis, Caucasian, copd, emphysema, Height Loss, History of Fracture (Adult), hx cervical ca, hypothyroid, Low Body Weight, lung ca, POSTmenopausal, Tobacco User Fractures: coccyx Treatments: GABAPENTIN, levothyroxine, multivitamin, plavix, trelegy ASSESSMENT: The BMD measured at Femur Total Right is 0.522 g/cm2 with a T-score of -3.9. This patient is considered osteoporotic according to [facility]) criteria. Lumbar spine was not utilized due to advanced degenerative changes.The scan quality is limited by exclusion of L-spine. Site Region Measured Measured WHO Young Adult BMD Date       Age      Classification T-score DualFemur Total Right 01/25/2023 73.8 Osteoporosis -3.9 0.522 g/cm2 Left Forearm Radius 33% 01/25/2021 73.8 Osteoporosis -3.7 0.556 g/cm2 World Health Organization [facility]) criteria for post-menopausal, Caucasian Women: Normal:       T-score at or above -1 SD Osteopenia:   T-score between -1 and -2.5 SD Osteoporosis: T-score at or below -2.5 SD RECOMMENDATIONS: 1. All patients should optimize calcium and vitamin D intake. 2. Consider FDA-approved medical therapies in postmenopausal women and men aged 69 years and older, based on the following: a. A hip or vertebral (clinical or morphometric) fracture b. T-score < -2.5 at the femoral neck or spine after appropriate evaluation to exclude secondary causes c. Low bone mass (T-score between -1.0 and -2.5 at the femoral neck or spine) and a 10-year probability of a hip fracture > 3% or a 10-year probability of a major osteoporosis-related fracture  > 20% based on the [place]-adapted WHO algorithm d. Clinician judgment and/or patient preferences may indicate treatment for people with 10-year fracture probabilities above or below these levels FOLLOW-UP: People with diagnosed cases of osteoporosis or at high risk for fracture should have regular bone mineral density tests. For patients eligible for Medicare, routine testing is allowed once every 2 years. The testing frequency can be increased to one year for patients who have rapidly progressing disease, those who are receiving or discontinuing medical therapy to restore bone mass, or have additional risk factors. I have reviewed this report, and agree with the above findings. [facility] Radiology Electronically Signed   By: Franki Cabot M.D.   On: 01/25/2021 15:11   MM 3D SCREEN BREAST BILATERAL  Result Date: 01/26/2021 CLINICAL DATA:  Screening. EXAM: DIGITAL SCREENING BILATERAL MAMMOGRAM WITH TOMOSYNTHESIS AND CAD TECHNIQUE: Bilateral screening digital craniocaudal and mediolateral oblique mammograms were obtained. Bilateral screening digital breast tomosynthesis was performed. The images were evaluated with computer-aided detection. COMPARISON:  None. ACR Breast Density Category c: The breast tissue is heterogeneously dense, which may obscure small masses. FINDINGS: In the right breast, calcifications warrant further evaluation with magnified views. In the left breast, no findings suspicious for malignancy. IMPRESSION: Further evaluation is suggested for calcifications in the right breast. RECOMMENDATION: Diagnostic mammogram of the right breast. (Code:[id number]) The patient will be contacted regarding the findings, and additional imaging will be scheduled. BI-RADS CATEGORY  0: Incomplete. Need additional imaging evaluation and/or prior mammograms for comparison. Electronically Signed   By: Nolon Nations M.D.   On: 01/26/2021 07:35      ASSESSMENT & PLAN:  1. Primary adenocarcinoma of right lung  ([place])   2. Encounter for antineoplastic chemotherapy   3. Anemia due to antineoplastic chemotherapy    Cancer Staging  Cervical cancer ([place]) Staging form: Cervix Uteri, AJCC Version 9 - Clinical: FIGO Stage IIICr (cTX, cN1) - Unsigned  Primary lung adenocarcinoma ([place]) Staging form: Lung, AJCC 7th Edition -  Clinical stage from 01/12/2021: T1, N1, M0 - Signed by [name], MD on 01/29/2021   #Stage III lung adenocarcinoma, recurrent  On concurrent chemotherapy and radiation. Labs reviewed and discussed with patient Proceed with weekly carboplatin/Taxol today .  #Hypertension, Continue current BP regimen.  #Chemotherapy-induced, hemoglobin continues to rise.  Close monitor.  Discussed with patient about possible need of blood transfusion next week.  She agrees.  # Locally advanced cervical cancer stage IIIC1 with pelvic nodes involvement. S/p concurrent chemotherapy and radiation followed by brachytherapy.  She is not actively following up with gynecology oncology.  I recommend patient to reestablish care.   All questions were answered. The patient knows to call the clinic with any problems questions or concerns.   Return of visit:  1 week lab MD carboplatin Taxol.   [name], MD, PhD 03/14/2021

## 2021-03-15 ENCOUNTER — Ambulatory Visit (INDEPENDENT_AMBULATORY_CARE_PROVIDER_SITE_OTHER): Payer: Medicare PPO | Admitting: *Deleted

## 2021-03-15 ENCOUNTER — Ambulatory Visit
Admission: RE | Admit: 2021-03-15 | Discharge: 2021-03-15 | Disposition: A | Discharge status: Home / Self Care | Payer: Medicare PPO | Source: Ambulatory Visit | Attending: Radiation Oncology | Admitting: Radiation Oncology

## 2021-03-15 DIAGNOSIS — I10 Essential (primary) hypertension: Secondary | ICD-10-CM | POA: Diagnosis not present

## 2021-03-15 DIAGNOSIS — D6481 Anemia due to antineoplastic chemotherapy: Secondary | ICD-10-CM | POA: Diagnosis not present

## 2021-03-15 DIAGNOSIS — C3491 Malignant neoplasm of unspecified part of right bronchus or lung: Secondary | ICD-10-CM | POA: Diagnosis not present

## 2021-03-15 DIAGNOSIS — Z0000 Encounter for general adult medical examination without abnormal findings: Secondary | ICD-10-CM | POA: Diagnosis not present

## 2021-03-15 DIAGNOSIS — Z79899 Other long term (current) drug therapy: Secondary | ICD-10-CM | POA: Diagnosis not present

## 2021-03-15 DIAGNOSIS — C770 Secondary and unspecified malignant neoplasm of lymph nodes of head, face and neck: Secondary | ICD-10-CM | POA: Diagnosis not present

## 2021-03-15 DIAGNOSIS — Z510 Encounter for antineoplastic radiation therapy: Secondary | ICD-10-CM | POA: Diagnosis not present

## 2021-03-15 DIAGNOSIS — C539 Malignant neoplasm of cervix uteri, unspecified: Secondary | ICD-10-CM | POA: Diagnosis not present

## 2021-03-15 DIAGNOSIS — Z5111 Encounter for antineoplastic chemotherapy: Secondary | ICD-10-CM | POA: Diagnosis not present

## 2021-03-15 DIAGNOSIS — T451X5A Adverse effect of antineoplastic and immunosuppressive drugs, initial encounter: Secondary | ICD-10-CM | POA: Diagnosis not present

## 2021-03-15 DIAGNOSIS — C3432 Malignant neoplasm of lower lobe, left bronchus or lung: Secondary | ICD-10-CM | POA: Diagnosis not present

## 2021-03-15 NOTE — Progress Notes (Signed)
Subjective:   LATASHA BUCZKOWSKI is a 76 y.o. female who presents for Medicare Annual (Subsequent) preventive examination.  I connected with  ASHLYNE OLENICK on 03/15/21 by a telephone enabled telemedicine application and verified that I am speaking with the correct person using two identifiers.   I discussed the limitations of evaluation and management by telemedicine. The patient expressed understanding and agreed to proceed.  Patient location: home  Provider location:  Tele-Health not in clinic    Review of Systems     Cardiac Risk Factors include: advanced age (>47mn, >>57women);smoking/ tobacco exposure;sedentary lifestyle     Objective:    Today's Vitals   03/15/21 0903  PainSc: 7    There is no height or weight on file to calculate BMI.  Advanced Directives 03/15/2021 03/14/2021 03/07/2021 02/21/2021 02/14/2021 01/28/2021 01/20/2021  Does Patient Have a Medical Advance Directive? _0  Yes No  Type of Advance Directive Living will Living will;Healthcare Power of Attorney - - - - -  Does patient want to make changes to medical advance directive? - - - No - Patient declined No - Patient declined - -  Copy of HBernicein Chart? - - - - - - -  Would patient like information on creating a medical advance directive? - - - - - - No - Patient declined    Current Medications (verified) Outpatient Encounter Medications as of 03/15/2021  Medication Sig   acetaminophen (TYLENOL) 500 MG tablet Take 500 mg by mouth every 6 (six) hours as needed for moderate pain.   alendronate (FOSAMAX) 70 MG tablet Take 1 tablet (70 mg total) by mouth every 7 (seven) days. Take with a full glass of water on an empty stomach.   chlorhexidine (PERIDEX) 0.12 % solution Use as directed 5 mLs in the mouth or throat 2 (two) times daily.   clopidogrel (PLAVIX) 75 MG tablet Take 1 tablet (75 mg total) by mouth daily.   cyclobenzaprine (FLEXERIL) 10 MG tablet TAKE 1 TABLET  AT BEDTIME   dexamethasone (DECADRON) 4 MG tablet Take 2 tablets (8 mg total) by mouth daily. Take for 2 days after chemotherapy. .   ferrous sulfate 325 (65 FE) MG EC tablet Take 1 tablet (325 mg total) by mouth daily with breakfast.   Fluticasone-Umeclidin-Vilant (TRELEGY ELLIPTA) 100-62.5-25 MCG/INH AEPB Inhale 1 puff into the lungs daily.   gabapentin (NEURONTIN) 100 MG capsule Take 1 capsule (100 mg total) by mouth 3 (three) times daily.   levothyroxine (SYNTHROID) 75 MCG tablet Take 1 tablet (75 mcg total) by mouth daily.   lidocaine-prilocaine (EMLA) cream Apply 1 application topically as needed.   lisinopril (ZESTRIL) 20 MG tablet Take 1 tablet (20 mg total) by mouth daily. (Patient taking differently: Take 20 mg by mouth every evening.)   montelukast (SINGULAIR) 10 MG tablet Take 1 tablet (10 mg total) by mouth See admin instructions. Take 126mon the day before chemotherapy   Multiple Vitamin (MULTIVITAMIN) tablet Take 1 tablet by mouth daily.   naproxen (NAPROSYN) 500 MG tablet TAKE 1 TABLET TWICE DAILY WITH MEALS   omeprazole (PRILOSEC) 20 MG capsule Take 1 capsule (20 mg total) by mouth daily. (Patient taking differently: Take 20 mg by mouth daily before lunch.)   ondansetron (ZOFRAN) 8 MG tablet Take 1 tablet (8 mg total) by mouth 2 (two) times daily as needed for refractory nausea / vomiting. Start on day 3 after chemo.   vitamin B-12 (CYANOCOBALAMIN) 500  MCG tablet Take 500 mcg by mouth daily.   No facility-administered encounter medications on file as of 03/15/2021.    Allergies (verified) Atorvastatin   History: Past Medical History:  Diagnosis Date   Arthritis    Cancer ([place])    Carotid atherosclerosis    Carotid bruit    Cervical cancer (HCC)    COPD (chronic obstructive pulmonary disease) (HCC)    emphysema   Coronary artery disease    Family history of adverse reaction to anesthesia    paternal grandfather died during surgery-pt unaware what happened   GERD  (gastroesophageal reflux disease)    Hyperlipidemia    Hypertension    Hyponatremia 02/27/2019   Hypothyroidism    Infected cat bite 1980s   was hospitalized   Irregular heartbeat    Medical history non-contributory    Peripheral vascular disease ([place])    Tobacco use    Past Surgical History:  Procedure Laterality Date   CAROTID ENDARTERECTOMY Right Oct. 2015   Dr. Lucky Cowboy   CAROTID STENOSIS Left April 2016   carotid stenosis surgery   COLONOSCOPY WITH PROPOFOL N/A 11/08/2016   Procedure: COLONOSCOPY WITH PROPOFOL;  Surgeon: Jonathon Bellows, MD;  Location[facility] ENDOSCOPY;  Service: Gastroenterology;  Laterality: N/A;   ESOPHAGOGASTRODUODENOSCOPY (EGD) WITH PROPOFOL N/A 11/08/2016   Procedure: ESOPHAGOGASTRODUODENOSCOPY (EGD) WITH PROPOFOL;  Surgeon: Jonathon Bellows, MD;  Location: [facility] ENDOSCOPY;  Service: Gastroenterology;  Laterality: N/A;   FLEXIBLE BRONCHOSCOPY N/A 12/17/2014   Procedure: FLEXIBLE BRONCHOSCOPY;  Surgeon: Wilhelmina Mcardle, MD;  Location: [facility] ORS;  Service: Pulmonary;  Laterality: N/A;   INCISION AND DRAINAGE / EXCISION THYROGLOSSAL CYST  April 2011   PORTA CATH INSERTION N/A 03/03/2021   Procedure: PORTA CATH INSERTION;  Surgeon: Algernon Huxley, MD;  Location: [place] [facility] LAB;  Service: Cardiovascular;  Laterality: N/A;   VIDEO BRONCHOSCOPY WITH ENDOBRONCHIAL ULTRASOUND N/A 01/12/2021   Procedure: VIDEO BRONCHOSCOPY WITH ENDOBRONCHIAL ULTRASOUND;  Surgeon: Tyler Pita, MD;  Location: [facility] ORS;  Service: Cardiopulmonary;  Laterality: N/A;   Family History  Problem Relation Age of Onset   Congestive Heart Failure Mother    Heart disease Mother    Hypertension Mother    Hypertension Sister    Diabetes Sister    Heart disease Sister    Heart disease Maternal Uncle    Heart disease Maternal Grandmother    Stroke Maternal Grandfather    Cancer Brother        liver, lung   Heart disease Brother    Hypertension Brother    Heart attack Brother    COPD Neg Hx     Breast cancer Neg Hx    Social History   Socioeconomic History   Marital status: Married    Spouse name: Deidre Ala   Number of children: Not on file   Years of education: Not on file   Highest education level: 9th grade  Occupational History   Occupation: retired   Tobacco Use   Smoking status: Every Day    Packs/day: 0.50    Years: 50.00    Pack years: 25.00    Types: Cigarettes   Smokeless tobacco: Never   Tobacco comments:    0.5 PPD 02/10/2021  Vaping Use   Vaping Use: Former  Substance and Sexual Activity   Alcohol use: No    Alcohol/week: 0.0 standard drinks   Drug use: No   Sexual activity: Yes  Other Topics Concern   Not on file  Social History Narrative  Lives at home with husband   Social Determinants of Health   Financial Resource Strain: Low Risk    Difficulty of Paying Living Expenses: Not hard at all  Food Insecurity: No Food Insecurity   Worried About Charity fundraiser in the Last Year: Never true   Arboriculturist in the Last Year: Never true  Transportation Needs: No Transportation Needs   Lack of Transportation (Medical): No   Lack of Transportation (Non-Medical): No  Physical Activity: Inactive   Days of Exercise per Week: 0 days   Minutes of Exercise per Session: 0 min  Stress: No Stress Concern Present   Feeling of Stress : Not at all  Social Connections: Moderately Isolated   Frequency of Communication with Friends and Family: More than three times a week   Frequency of Social Gatherings with Friends and Family: More than three times a week   Attends Religious Services: Never   Marine scientist or Organizations: No   Attends Archivist Meetings: Never   Marital Status: Married    Tobacco Counseling Ready to quit: Not Answered Counseling given: Not Answered Tobacco comments: 0.5 PPD 02/10/2021   Clinical Intake:  Pre-visit preparation completed: Yes  Pain : 0-10 Pain Score: 7  Pain Type: Chronic pain Pain  Location:  (under ribs.  no injury  has spoken to MD) Pain Orientation: Left Pain Descriptors / Indicators: Burning, Aching, Discomfort, Nagging Pain Onset: More than a month ago Pain Frequency: Intermittent     Nutritional Risks: None Diabetes: No     Diabetic?  no  Interpreter Needed?: No  Information entered by :: Leroy Kennedy LPN   Activities of Daily Living In your present state of health, do you have any difficulty performing the following activities: 03/15/2021 03/03/2021  Hearing? Y Y  Comment - Right ear hearing aide  Vision? N N  Difficulty concentrating or making decisions? N N  Walking or climbing stairs? Y -  Dressing or bathing? N N  Doing errands, shopping? N -  Preparing Food and eating ? N -  Using the Toilet? N -  In the past six months, have you accidently leaked urine? N -  Do you have problems with loss of bowel control? N -  Managing your Medications? N -  Managing your Finances? N -  Housekeeping or managing your Housekeeping? Y -  Some recent data might be hidden    Patient Care Team: [name], DO as PCP - General (Family Medicine) Clent Jacks, RN as Oncology Nurse Navigator Noreene Filbert, MD as Radiation Oncologist (Radiation Oncology) Telford Nab, RN as Oncology Nurse Navigator [name], MD as Consulting Physician (Oncology)  Indicate any recent Medical Services you may have received from other than [facility] providers in the past year (date may be approximate).     Assessment:   This is a routine wellness examination for Brylan.  Hearing/Vision screen Hearing Screening - Comments:: Has hearing aid in right ear it was bought over the counter Vision Screening - Comments:: Not up to date  Dietary issues and exercise activities discussed: Exercise limited by: None identified (patient is going through radiation at this time)   Goals Addressed             This Visit's Progress    Patient Stated       No goals          Depression Screen [facility] 2/9 Scores 03/15/2021 01/04/2021 05/20/2020 04/30/2019 07/11/2018 04/25/2018  04/09/2017  PHQ - 2 Score 2 0 1 1 0 0 0  PHQ- 9 Score 2 1 - - 0 - -    Fall Risk Fall Risk  03/15/2021 05/20/2020 05/16/2019 05/05/2019 04/30/2019  Falls in the past year? 0 0 0 0 0  Number falls in past yr: 0 0 - - 0  Injury with Fall? 0 0 - - 0  Risk for fall due to : - Impaired balance/gait - - -  Follow up Falls evaluation completed;Education provided;Falls prevention discussed Falls evaluation completed - - -    FALL RISK PREVENTION PERTAINING TO THE HOME:  Any stairs in or around the home? Yes  If so, are there any without handrails? No  Home free of loose throw rugs in walkways, pet beds, electrical cords, etc? Yes  Adequate lighting in your home to reduce risk of falls? Yes   ASSISTIVE DEVICES UTILIZED TO PREVENT FALLS:  Life alert? No  Use of a cane, walker or w/c? Yes  Grab bars in the bathroom? No  Shower chair or bench in shower? Yes  Elevated toilet seat or a handicapped toilet? Yes   TIMED UP AND GO:  Was the test performed? No .    Cognitive Function:  Normal cognitive status assessed by direct observation by this Nurse Health Advisor. No abnormalities found.       6CIT Screen 04/25/2018 04/09/2017 01/11/2016  What Year? 0 points 0 points 0 points  What month? 0 points 0 points 0 points  What time? 0 points 0 points 0 points  Count back from 20 0 points 0 points 0 points  Months in reverse 0 points 0 points 2 points  Repeat phrase 0 points 0 points 2 points  Total Score 0 0 4    Immunizations Immunization History  Administered Date(s) Administered   Fluad Quad(high Dose 65+) 11/26/2018, 11/14/2019, 01/04/2021   Influenza, High Dose Seasonal PF 01/11/2016, 11/16/2016, 10/16/2017   Influenza,inj,Quad PF,6+ Mos 11/25/2014   Janssen (J&J) SARS-COV-2 Vaccination 08/16/2019   Pneumococcal Conjugate-13 07/13/2015   Pneumococcal Polysaccharide-23 07/10/2012   Tdap  07/13/2015    TDAP status: Up to date  Flu Vaccine status: Up to date  Pneumococcal vaccine status: Up to date  Covid-19 vaccine status: Information provided on how to obtain vaccines.   Qualifies for Shingles Vaccine? Yes   Zostavax completed No   Shingrix Completed?: No.    Education has been provided regarding the importance of this vaccine. Patient has been advised to call insurance company to determine out of pocket expense if they have not yet received this vaccine. Advised may also receive vaccine at local pharmacy or Health Dept. Verbalized acceptance and understanding.  Screening Tests Health Maintenance  Topic Date Due   COVID-19 Vaccine (2 - Janssen risk series) 09/13/2019   Zoster Vaccines- Shingrix (1 of 2) 06/12/2021 (Originally 03/27/1964)   COLONOSCOPY (Pts 45-49yr Insurance coverage will need to be confirmed)  11/08/2021   MAMMOGRAM  01/26/2023   TETANUS/TDAP  07/12/2025   Pneumonia Vaccine 74 Years old  Completed   INFLUENZA VACCINE  Completed   DEXA SCAN  Completed   Hepatitis C Screening  Completed   HPV VACCINES  Aged Out    Health Maintenance  Health Maintenance Due  Topic Date Due   COVID-19 Vaccine (2 - Janssen risk series) 09/13/2019    Colorectal cancer screening: Type of screening: Colonoscopy. Completed 2018. Repeat every 5 years  patient does not want to schedule until finishes radiation  Mammogram  patient was called back for a recheck does not want to have at this time  Bone Density status: Completed  . Results reflect: Bone density results: OSTEOPOROSIS. Repeat every 2 years.  Lung Cancer Screening: (Low Dose CT Chest recommended if Age 30-80 years, 30 pack-year currently smoking OR have quit w/in 15years.) does qualify.   Lung Cancer Screening Referral:  patient is going through radiation right now  Additional Screening:  Hepatitis C Screening: does not qualify; Completed 2019  Vision Screening: Recommended annual ophthalmology  exams for early detection of glaucoma and other disorders of the eye. Is the patient up to date with their annual eye exam?  No  Who is the provider or what is the name of the office in which the patient attends annual eye exams?  If pt is not established with a provider, would they like to be referred to a provider to establish care? No .   Dental Screening: Recommended annual dental exams for proper oral hygiene  Community Resource Referral / Chronic Care Management: CRR required this visit?  No   CCM required this visit?  No      Plan:     I have personally reviewed and noted the following in the patients chart:   Medical and social history Use of alcohol, tobacco or illicit drugs  Current medications and supplements including opioid prescriptions.  Functional ability and status Nutritional status Physical activity Advanced directives List of other physicians Hospitalizations, surgeries, and ER visits in previous 12 months Vitals Screenings to include cognitive, depression, and falls Referrals and appointments  In addition, I have reviewed and discussed with patient certain preventive protocols, quality metrics, and best practice recommendations. A written personalized care plan for preventive services as well as general preventive health recommendations were provided to patient.     Leroy Kennedy, LPN   [id number]   Nurse Notes:

## 2021-03-15 NOTE — Patient Instructions (Signed)
Mary Hoover , Thank you for taking time to come for your Medicare Wellness Visit. I appreciate your ongoing commitment to your health goals. Please review the following plan we discussed and let me know if I can assist you in the future.   Screening recommendations/referrals: Colonoscopy: up to date Mammogram: Education provided Bone Density: up to date Recommended yearly ophthalmology/optometry visit for glaucoma screening and checkup Recommended yearly dental visit for hygiene and checkup  Vaccinations: Influenza vaccine: up to date Pneumococcal vaccine: up to date Tdap vaccine: up to date Shingles vaccine: Education provided    Advanced directives: yes  Conditions/risks identified:      Preventive Care 74 Years and Older, Female Preventive care refers to lifestyle choices and visits with your health care provider that can promote health and wellness. What does preventive care include? A yearly physical exam. This is also called an annual well check. Dental exams once or twice a year. Routine eye exams. Ask your health care provider how often you should have your eyes checked. Personal lifestyle choices, including: Daily care of your teeth and gums. Regular physical activity. Eating a healthy diet. Avoiding tobacco and drug use. Limiting alcohol use. Practicing safe sex. Taking low-dose aspirin every day. Taking vitamin and mineral supplements as recommended by your health care provider. What happens during an annual well check? The services and screenings done by your health care provider during your annual well check will depend on your age, overall health, lifestyle risk factors, and family history of disease. Counseling  Your health care provider may ask you questions about your: Alcohol use. Tobacco use. Drug use. Emotional well-being. Home and relationship well-being. Sexual activity. Eating habits. History of falls. Memory and ability to understand  (cognition). Work and work Statistician. Reproductive health. Screening  You may have the following tests or measurements: Height, weight, and BMI. Blood pressure. Lipid and cholesterol levels. These may be checked every 5 years, or more frequently if you are over 67 years old. Skin check. Lung cancer screening. You may have this screening every year starting at age 74 if you have a 30-pack-year history of smoking and currently smoke or have quit within the past 15 years. Fecal occult blood test (FOBT) of the stool. You may have this test every year starting at age 74. Flexible sigmoidoscopy or colonoscopy. You may have a sigmoidoscopy every 5 years or a colonoscopy every 10 years starting at age 74. Hepatitis C blood test. Hepatitis B blood test. Sexually transmitted disease (STD) testing. Diabetes screening. This is done by checking your blood sugar (glucose) after you have not eaten for a while (fasting). You may have this done every 1-3 years. Bone density scan. This is done to screen for osteoporosis. You may have this done starting at age 74. Mammogram. This may be done every 1-2 years. Talk to your health care provider about how often you should have regular mammograms. Talk with your health care provider about your test results, treatment options, and if necessary, the need for more tests. Vaccines  Your health care provider may recommend certain vaccines, such as: Influenza vaccine. This is recommended every year. Tetanus, diphtheria, and acellular pertussis (Tdap, Td) vaccine. You may need a Td booster every 10 years. Zoster vaccine. You may need this after age 74. Pneumococcal 13-valent conjugate (PCV13) vaccine. One dose is recommended after age 74. Pneumococcal polysaccharide (PPSV23) vaccine. One dose is recommended after age 75. Talk to your health care provider about which screenings and vaccines you need  and how often you need them. This information is not intended to  replace advice given to you by your health care provider. Make sure you discuss any questions you have with your health care provider. Document Released: 02/12/2015 Document Revised: 10/06/2015 Document Reviewed: 11/17/2014 Elsevier Interactive Patient Education  2017 [place] Prevention in the Home Falls can cause injuries. They can happen to people of all ages. There are many things you can do to make your home safe and to help prevent falls. What can I do on the outside of my home? Regularly fix the edges of walkways and driveways and fix any cracks. Remove anything that might make you trip as you walk through a door, such as a raised step or threshold. Trim any bushes or trees on the path to your home. Use bright outdoor lighting. Clear any walking paths of anything that might make someone trip, such as rocks or tools. Regularly check to see if handrails are loose or broken. Make sure that both sides of any steps have handrails. Any raised decks and porches should have guardrails on the edges. Have any leaves, snow, or ice cleared regularly. Use sand or salt on walking paths during winter. Clean up any spills in your garage right away. This includes oil or grease spills. What can I do in the bathroom? Use night lights. Install grab bars by the toilet and in the tub and shower. Do not use towel bars as grab bars. Use non-skid mats or decals in the tub or shower. If you need to sit down in the shower, use a plastic, non-slip stool. Keep the floor dry. Clean up any water that spills on the floor as soon as it happens. Remove soap buildup in the tub or shower regularly. Attach bath mats securely with double-sided non-slip rug tape. Do not have throw rugs and other things on the floor that can make you trip. What can I do in the bedroom? Use night lights. Make sure that you have a light by your bed that is easy to reach. Do not use any sheets or blankets that are too big for  your bed. They should not hang down onto the floor. Have a firm chair that has side arms. You can use this for support while you get dressed. Do not have throw rugs and other things on the floor that can make you trip. What can I do in the kitchen? Clean up any spills right away. Avoid walking on wet floors. Keep items that you use a lot in easy-to-reach places. If you need to reach something above you, use a strong step stool that has a grab bar. Keep electrical cords out of the way. Do not use floor polish or wax that makes floors slippery. If you must use wax, use non-skid floor wax. Do not have throw rugs and other things on the floor that can make you trip. What can I do with my stairs? Do not leave any items on the stairs. Make sure that there are handrails on both sides of the stairs and use them. Fix handrails that are broken or loose. Make sure that handrails are as long as the stairways. Check any carpeting to make sure that it is firmly attached to the stairs. Fix any carpet that is loose or worn. Avoid having throw rugs at the top or bottom of the stairs. If you do have throw rugs, attach them to the floor with carpet tape. Make sure that you  have a light switch at the top of the stairs and the bottom of the stairs. If you do not have them, ask someone to add them for you. What else can I do to help prevent falls? Wear shoes that: Do not have high heels. Have rubber bottoms. Are comfortable and fit you well. Are closed at the toe. Do not wear sandals. If you use a stepladder: Make sure that it is fully opened. Do not climb a closed stepladder. Make sure that both sides of the stepladder are locked into place. Ask someone to hold it for you, if possible. Clearly mark and make sure that you can see: Any grab bars or handrails. First and last steps. Where the edge of each step is. Use tools that help you move around (mobility aids) if they are needed. These  include: Canes. Walkers. Scooters. Crutches. Turn on the lights when you go into a dark area. Replace any light bulbs as soon as they burn out. Set up your furniture so you have a clear path. Avoid moving your furniture around. If any of your floors are uneven, fix them. If there are any pets around you, be aware of where they are. Review your medicines with your doctor. Some medicines can make you feel dizzy. This can increase your chance of falling. Ask your doctor what other things that you can do to help prevent falls. This information is not intended to replace advice given to you by your health care provider. Make sure you discuss any questions you have with your health care provider. Document Released: 11/12/2008 Document Revised: 06/24/2015 Document Reviewed: 02/20/2014 Elsevier Interactive Patient Education  2017 [employer].

## 2021-03-16 ENCOUNTER — Ambulatory Visit
Admission: RE | Admit: 2021-03-16 | Discharge: 2021-03-16 | Disposition: A | Discharge status: Home / Self Care | Payer: Medicare PPO | Source: Ambulatory Visit | Attending: Radiation Oncology | Admitting: Radiation Oncology

## 2021-03-16 DIAGNOSIS — Z5111 Encounter for antineoplastic chemotherapy: Secondary | ICD-10-CM | POA: Diagnosis not present

## 2021-03-16 DIAGNOSIS — D6481 Anemia due to antineoplastic chemotherapy: Secondary | ICD-10-CM | POA: Diagnosis not present

## 2021-03-16 DIAGNOSIS — C539 Malignant neoplasm of cervix uteri, unspecified: Secondary | ICD-10-CM | POA: Diagnosis not present

## 2021-03-16 DIAGNOSIS — Z79899 Other long term (current) drug therapy: Secondary | ICD-10-CM | POA: Diagnosis not present

## 2021-03-16 DIAGNOSIS — T451X5A Adverse effect of antineoplastic and immunosuppressive drugs, initial encounter: Secondary | ICD-10-CM | POA: Diagnosis not present

## 2021-03-16 DIAGNOSIS — Z510 Encounter for antineoplastic radiation therapy: Secondary | ICD-10-CM | POA: Diagnosis not present

## 2021-03-16 DIAGNOSIS — C3432 Malignant neoplasm of lower lobe, left bronchus or lung: Secondary | ICD-10-CM | POA: Diagnosis not present

## 2021-03-16 DIAGNOSIS — I10 Essential (primary) hypertension: Secondary | ICD-10-CM | POA: Diagnosis not present

## 2021-03-16 DIAGNOSIS — C3491 Malignant neoplasm of unspecified part of right bronchus or lung: Secondary | ICD-10-CM | POA: Diagnosis not present

## 2021-03-16 DIAGNOSIS — C770 Secondary and unspecified malignant neoplasm of lymph nodes of head, face and neck: Secondary | ICD-10-CM | POA: Diagnosis not present

## 2021-03-17 ENCOUNTER — Ambulatory Visit
Admission: RE | Admit: 2021-03-17 | Discharge: 2021-03-17 | Disposition: A | Discharge status: Home / Self Care | Payer: Medicare PPO | Source: Ambulatory Visit | Attending: Radiation Oncology | Admitting: Radiation Oncology

## 2021-03-17 DIAGNOSIS — T451X5A Adverse effect of antineoplastic and immunosuppressive drugs, initial encounter: Secondary | ICD-10-CM | POA: Diagnosis not present

## 2021-03-17 DIAGNOSIS — C3491 Malignant neoplasm of unspecified part of right bronchus or lung: Secondary | ICD-10-CM | POA: Diagnosis not present

## 2021-03-17 DIAGNOSIS — Z79899 Other long term (current) drug therapy: Secondary | ICD-10-CM | POA: Diagnosis not present

## 2021-03-17 DIAGNOSIS — C3432 Malignant neoplasm of lower lobe, left bronchus or lung: Secondary | ICD-10-CM | POA: Diagnosis not present

## 2021-03-17 DIAGNOSIS — I10 Essential (primary) hypertension: Secondary | ICD-10-CM | POA: Diagnosis not present

## 2021-03-17 DIAGNOSIS — Z5111 Encounter for antineoplastic chemotherapy: Secondary | ICD-10-CM | POA: Diagnosis not present

## 2021-03-17 DIAGNOSIS — D6481 Anemia due to antineoplastic chemotherapy: Secondary | ICD-10-CM | POA: Diagnosis not present

## 2021-03-17 DIAGNOSIS — C770 Secondary and unspecified malignant neoplasm of lymph nodes of head, face and neck: Secondary | ICD-10-CM | POA: Diagnosis not present

## 2021-03-17 DIAGNOSIS — C539 Malignant neoplasm of cervix uteri, unspecified: Secondary | ICD-10-CM | POA: Diagnosis not present

## 2021-03-17 DIAGNOSIS — Z510 Encounter for antineoplastic radiation therapy: Secondary | ICD-10-CM | POA: Diagnosis not present

## 2021-03-18 ENCOUNTER — Ambulatory Visit
Admission: RE | Admit: 2021-03-18 | Discharge: 2021-03-18 | Disposition: A | Discharge status: Home / Self Care | Payer: Medicare PPO | Source: Ambulatory Visit | Attending: Radiation Oncology | Admitting: Radiation Oncology

## 2021-03-18 DIAGNOSIS — T451X5A Adverse effect of antineoplastic and immunosuppressive drugs, initial encounter: Secondary | ICD-10-CM | POA: Diagnosis not present

## 2021-03-18 DIAGNOSIS — Z79899 Other long term (current) drug therapy: Secondary | ICD-10-CM | POA: Diagnosis not present

## 2021-03-18 DIAGNOSIS — C770 Secondary and unspecified malignant neoplasm of lymph nodes of head, face and neck: Secondary | ICD-10-CM | POA: Diagnosis not present

## 2021-03-18 DIAGNOSIS — C3491 Malignant neoplasm of unspecified part of right bronchus or lung: Secondary | ICD-10-CM | POA: Diagnosis not present

## 2021-03-18 DIAGNOSIS — D6481 Anemia due to antineoplastic chemotherapy: Secondary | ICD-10-CM | POA: Diagnosis not present

## 2021-03-18 DIAGNOSIS — I10 Essential (primary) hypertension: Secondary | ICD-10-CM | POA: Diagnosis not present

## 2021-03-18 DIAGNOSIS — C3432 Malignant neoplasm of lower lobe, left bronchus or lung: Secondary | ICD-10-CM | POA: Diagnosis not present

## 2021-03-18 DIAGNOSIS — Z5111 Encounter for antineoplastic chemotherapy: Secondary | ICD-10-CM | POA: Diagnosis not present

## 2021-03-18 DIAGNOSIS — C539 Malignant neoplasm of cervix uteri, unspecified: Secondary | ICD-10-CM | POA: Diagnosis not present

## 2021-03-18 DIAGNOSIS — Z510 Encounter for antineoplastic radiation therapy: Secondary | ICD-10-CM | POA: Diagnosis not present

## 2021-03-21 ENCOUNTER — Inpatient Hospital Stay (HOSPITAL_BASED_OUTPATIENT_CLINIC_OR_DEPARTMENT_OTHER): Payer: Medicare PPO | Admitting: Oncology

## 2021-03-21 ENCOUNTER — Encounter: Payer: Self-pay | Admitting: Oncology

## 2021-03-21 ENCOUNTER — Other Ambulatory Visit: Payer: Self-pay

## 2021-03-21 ENCOUNTER — Inpatient Hospital Stay: Payer: Medicare PPO

## 2021-03-21 ENCOUNTER — Ambulatory Visit
Admission: RE | Admit: 2021-03-21 | Discharge: 2021-03-21 | Disposition: A | Discharge status: Home / Self Care | Payer: Medicare PPO | Source: Ambulatory Visit | Attending: Radiation Oncology | Admitting: Radiation Oncology

## 2021-03-21 VITALS — BP 143/71 | HR 95 | Temp 97.2°F | Resp 18 | Wt 139.4 lb

## 2021-03-21 DIAGNOSIS — D6481 Anemia due to antineoplastic chemotherapy: Secondary | ICD-10-CM

## 2021-03-21 DIAGNOSIS — C3491 Malignant neoplasm of unspecified part of right bronchus or lung: Secondary | ICD-10-CM

## 2021-03-21 DIAGNOSIS — Z5111 Encounter for antineoplastic chemotherapy: Secondary | ICD-10-CM | POA: Diagnosis not present

## 2021-03-21 DIAGNOSIS — T451X5A Adverse effect of antineoplastic and immunosuppressive drugs, initial encounter: Secondary | ICD-10-CM | POA: Diagnosis not present

## 2021-03-21 DIAGNOSIS — C539 Malignant neoplasm of cervix uteri, unspecified: Secondary | ICD-10-CM

## 2021-03-21 DIAGNOSIS — I10 Essential (primary) hypertension: Secondary | ICD-10-CM | POA: Diagnosis not present

## 2021-03-21 DIAGNOSIS — Z510 Encounter for antineoplastic radiation therapy: Secondary | ICD-10-CM | POA: Diagnosis not present

## 2021-03-21 DIAGNOSIS — Z79899 Other long term (current) drug therapy: Secondary | ICD-10-CM | POA: Diagnosis not present

## 2021-03-21 DIAGNOSIS — C3432 Malignant neoplasm of lower lobe, left bronchus or lung: Secondary | ICD-10-CM | POA: Diagnosis not present

## 2021-03-21 DIAGNOSIS — C538 Malignant neoplasm of overlapping sites of cervix uteri: Secondary | ICD-10-CM

## 2021-03-21 DIAGNOSIS — C770 Secondary and unspecified malignant neoplasm of lymph nodes of head, face and neck: Secondary | ICD-10-CM | POA: Diagnosis not present

## 2021-03-21 LAB — CBC WITH DIFFERENTIAL/PLATELET
Abs Immature Granulocytes: 0.1 10*3/uL — ABNORMAL HIGH (ref 0.00–0.07)
Basophils Absolute: 0 10*3/uL (ref 0.0–0.1)
Basophils Relative: 1 %
Eosinophils Absolute: 0 10*3/uL (ref 0.0–0.5)
Eosinophils Relative: 1 %
HCT: 25.3 % — ABNORMAL LOW (ref 36.0–46.0)
Hemoglobin: 8.5 g/dL — ABNORMAL LOW (ref 12.0–15.0)
Immature Granulocytes: 2 %
Lymphocytes Relative: 9 %
Lymphs Abs: 0.4 10*3/uL — ABNORMAL LOW (ref 0.7–4.0)
MCH: 33.3 pg (ref 26.0–34.0)
MCHC: 33.6 g/dL (ref 30.0–36.0)
MCV: 99.2 fL (ref 80.0–100.0)
Monocytes Absolute: 0.2 10*3/uL (ref 0.1–1.0)
Monocytes Relative: 5 %
Neutro Abs: 3.4 10*3/uL (ref 1.7–7.7)
Neutrophils Relative %: 82 %
Platelets: 273 10*3/uL (ref 150–400)
RBC: 2.55 MIL/uL — ABNORMAL LOW (ref 3.87–5.11)
RDW: 15 % (ref 11.5–15.5)
WBC: 4.2 10*3/uL (ref 4.0–10.5)
nRBC: 0 % (ref 0.0–0.2)

## 2021-03-21 LAB — COMPREHENSIVE METABOLIC PANEL
ALT: 16 U/L (ref 0–44)
AST: 19 U/L (ref 15–41)
Albumin: 3.3 g/dL — ABNORMAL LOW (ref 3.5–5.0)
Alkaline Phosphatase: 68 U/L (ref 38–126)
Anion gap: 4 — ABNORMAL LOW (ref 5–15)
BUN: 22 mg/dL (ref 8–23)
CO2: 20 mmol/L — ABNORMAL LOW (ref 22–32)
Calcium: 7.9 mg/dL — ABNORMAL LOW (ref 8.9–10.3)
Chloride: 106 mmol/L (ref 98–111)
Creatinine, Ser: 0.93 mg/dL (ref 0.44–1.00)
GFR, Estimated: >60 mL/min (ref >60)
Glucose, Bld: 101 mg/dL — ABNORMAL HIGH (ref 70–99)
Potassium: 4.5 mmol/L (ref 3.5–5.1)
Sodium: 130 mmol/L — ABNORMAL LOW (ref 135–145)
Total Bilirubin: 0.1 mg/dL — ABNORMAL LOW (ref 0.3–1.2)
Total Protein: 6.2 g/dL — ABNORMAL LOW (ref 6.5–8.1)

## 2021-03-21 MED ORDER — SODIUM CHLORIDE 0.9 % IV SOLN
10.0000 mg | Freq: Once | INTRAVENOUS | Status: AC
  Administered 2021-03-21: 10 mg via INTRAVENOUS
  Filled 2021-03-21: qty 10

## 2021-03-21 MED ORDER — DIPHENHYDRAMINE HCL 50 MG/ML IJ SOLN
50.0000 mg | Freq: Once | INTRAMUSCULAR | Status: AC
  Administered 2021-03-21: 50 mg via INTRAVENOUS
  Filled 2021-03-21: qty 1

## 2021-03-21 MED ORDER — SODIUM CHLORIDE 0.9 % IV SOLN
Freq: Once | INTRAVENOUS | Status: AC
  Filled 2021-03-21: qty 250

## 2021-03-21 MED ORDER — PALONOSETRON HCL INJECTION 0.25 MG/5ML
0.2500 mg | Freq: Once | INTRAVENOUS | Status: AC
  Administered 2021-03-21: 0.25 mg via INTRAVENOUS
  Filled 2021-03-21: qty 5

## 2021-03-21 MED ORDER — HEPARIN SOD (PORK) LOCK FLUSH 100 UNIT/ML IV SOLN
500.0000 [IU] | Freq: Once | INTRAVENOUS | Status: AC
  Administered 2021-03-21: 500 [IU] via INTRAVENOUS
  Filled 2021-03-21: qty 5

## 2021-03-21 MED ORDER — SODIUM CHLORIDE 0.9% FLUSH
10.0000 mL | INTRAVENOUS | Status: DC | PRN
  Filled 2021-03-21: qty 10

## 2021-03-21 MED ORDER — FAMOTIDINE IN NACL 20-0.9 MG/50ML-% IV SOLN
20.0000 mg | Freq: Once | INTRAVENOUS | Status: AC
  Administered 2021-03-21: 20 mg via INTRAVENOUS
  Filled 2021-03-21: qty 50

## 2021-03-21 MED ORDER — SODIUM CHLORIDE 0.9 % IV SOLN
45.0000 mg/m2 | Freq: Once | INTRAVENOUS | Status: AC
  Administered 2021-03-21: 78 mg via INTRAVENOUS
  Filled 2021-03-21: qty 13

## 2021-03-21 MED ORDER — SODIUM CHLORIDE 0.9 % IV SOLN
146.4000 mg | Freq: Once | INTRAVENOUS | Status: AC
  Administered 2021-03-21: 150 mg via INTRAVENOUS
  Filled 2021-03-21: qty 15

## 2021-03-21 MED ORDER — HEPARIN SOD (PORK) LOCK FLUSH 100 UNIT/ML IV SOLN
500.0000 [IU] | Freq: Once | INTRAVENOUS | Status: DC | PRN
  Filled 2021-03-21: qty 5

## 2021-03-21 MED ORDER — SODIUM CHLORIDE 0.9% FLUSH
10.0000 mL | Freq: Once | INTRAVENOUS | Status: AC
  Administered 2021-03-21: 10 mL via INTRAVENOUS
  Filled 2021-03-21: qty 10

## 2021-03-21 NOTE — Progress Notes (Signed)
Hematology/Oncology progress note [phone] [phone]   Patient Care Team: [name], DO as PCP - General (Family Medicine) Clent Jacks, RN as Oncology Nurse Navigator Noreene Filbert, MD as Radiation Oncologist (Radiation Oncology) Telford Nab, RN as Oncology Nurse Navigator [name], MD as Consulting Physician (Oncology)  REFERRING PROVIDER: [name], DO  CHIEF COMPLAINTS/REASON FOR VISIT:  Follow up lung cancer treatments  HISTORY OF PRESENTING ILLNESS:  # Cervix cancer Patient has developed postmenopausal vaginal bleeding and discharge.  She was noted to have a friable cervix/2 cm mass of the cervix, concerning for malignancy 12/19/2018 endometrial biopsy showed scattered atypical squamous cells suspicious for malignancy.  Predominantly necrosis with associated inflammation. 01/01/2019 initial cervix biopsy showed at least high-grade squamous intraepithelial lesion.-HPV negative 01/22/2019, repeat vaginal wall biopsy and cervix 9:00 biopsy showed squamous cell carcinoma.    Staging images 12/31/2018 showed fluid distending the endometrial canal and or endometrial thickening to the level of cervix.  No evidence of pathological lymphadenopathy.  Haziness about the lower cervix.  7 mm irregular pulmonary nodule within the right upper lobe, suspicious for possible primary or metastatic malignancy.  Chronic pleural-parenchymal scarring/fibrosis at the lung apices.  Large amount of stool.  No bowel obstruction. 01/08/2019 PET scan showed marked hypermetabolism in the region of the cervix, compatible with the reported history of cervical cancer. Small bilateral pelvic sidewall lymph nodes discernible FDG accumulation, concerning for metastatic disease although neither lymph node is enlarged by CT size criteria. 7 mm nodule in the right upper lobe shows discernible FDG accumulation.  Questionable neoplasm, primary versus metastatic. Emphysema.    #Chronic hearing loss  # Lung nodule, FDG avid, questionable primary bronchogenic carcinoma versus metastatic cancer.Discussed with radiation oncology.  Her case was also discussed on tumor board.  Consensus reached on finishing concurrent chemoradiation treatments for cervix cancer first. Possible SBRT to lung nodule for presumed primary lung cancer.  # 02/20/2020 - 03/19/2020 concurrent chemotherapy weekly carboplatin and taxol and radiation for treatment of this cervix cancer.  #December 2021 lung nodule,was evaluated by Dr.Oaks. Options of biopsy via transthoracic or endobronchial approach vs surgical resection.  Patient opted to empiric SBRT  Patient was last seen by me on 06/25/2019.  Lost follow-up. She continues to follow with radiation oncology. 11/20/2020, CT chest with contrast showed minimal residual of previously seen right upper lobe nodule.  0.6 x 0.4 cm.  Internal development of mild bandlike radiation fibrosis.  Newly enlarged pretracheal lymph node, measuring 2.5 x 1.7 cm.  Highly concerning for metastatic disease.  Unchanged prominent AP window and the right hilar lymph nodes.  Emphysema and diffuse bilateral bronchial wall thickening.  Coronary artery disease. 12/13/2020, PET scan showed enlarging hypermetabolic right lower paratracheal lymph node, compatible with metastatic disease.  SUV 10.3.  Mild FDG uptake of the bilateral sacral alla with associated lucency.  Concerning for sacral insufficiency fracture.  Stable posttreatment changes of the right upper lobe nodule.  Nonspecific small solid 3 mm pulmonary nodule of the left lower lobe.  2 small to be characterized.  No evidence of FDG avid metastatic disease in the abdomen or pelvis.  Aortic atherosclerosis and emphysema.  Patient reestablish care on 12/27/2020 for lung cancer treatments   #01/12/2021 patient underwent bronchoscopy biopsy by Dr. Patsey Berthold Right paratracheal lymph node fine-needle aspiration is positive for  metastatic non-small cell carcinoma, favor adenocarcinoma of lung origin.  # 02/14/2021, concurrent carboplatin AUC 2 and taxol 61m/m2  INTERVAL HISTORY Mary SIDENERis a  76 y.o. female who has above history reviewed by me today presents for follow up visit for management of possible recurrent lung cancer.  History of cervix cancer.   Patient tolerates chemotherapy treatments.  No nausea, vomiting diarrhea.  + chronic fatigue, she feels a bit " slow", fatigue level is not significantly worse than her baseline. Not lightheaded.    Review of Systems  Constitutional:  Positive for fatigue. Negative for appetite change, chills and fever.  HENT:   Positive for hearing loss. Negative for voice change.   Eyes:  Negative for eye problems.  Respiratory:  Negative for chest tightness and cough.   Cardiovascular:  Negative for chest pain.  Gastrointestinal:  Negative for abdominal distention, abdominal pain and blood in stool.  Endocrine: Negative for hot flashes.  Genitourinary:  Negative for difficulty urinating and frequency.   Musculoskeletal:  Negative for arthralgias.  Skin:  Negative for itching and rash.  Neurological:  Negative for extremity weakness.  Hematological:  Negative for adenopathy.  Psychiatric/Behavioral:  Negative for confusion.    MEDICAL HISTORY:  Past Medical History:  Diagnosis Date   Arthritis    Cancer ([place])    Carotid atherosclerosis    Carotid bruit    Cervical cancer (HCC)    COPD (chronic obstructive pulmonary disease) (HCC)    emphysema   Coronary artery disease    Family history of adverse reaction to anesthesia    paternal grandfather died during surgery-pt unaware what happened   GERD (gastroesophageal reflux disease)    Hyperlipidemia    Hypertension    Hyponatremia 02/27/2019   Hypothyroidism    Infected cat bite 1980s   was hospitalized   Irregular heartbeat    Medical history non-contributory    Peripheral vascular disease ([place])     Tobacco use     SURGICAL HISTORY: Past Surgical History:  Procedure Laterality Date   CAROTID ENDARTERECTOMY Right Oct. 2015   Dr. Lucky Cowboy   CAROTID STENOSIS Left April 2016   carotid stenosis surgery   COLONOSCOPY WITH PROPOFOL N/A 11/08/2016   Procedure: COLONOSCOPY WITH PROPOFOL;  Surgeon: Jonathon Bellows, MD;  Location: [facility] ENDOSCOPY;  Service: Gastroenterology;  Laterality: N/A;   ESOPHAGOGASTRODUODENOSCOPY (EGD) WITH PROPOFOL N/A 11/08/2016   Procedure: ESOPHAGOGASTRODUODENOSCOPY (EGD) WITH PROPOFOL;  Surgeon: Jonathon Bellows, MD;  Location: [facility] ENDOSCOPY;  Service: Gastroenterology;  Laterality: N/A;   FLEXIBLE BRONCHOSCOPY N/A 12/17/2014   Procedure: FLEXIBLE BRONCHOSCOPY;  Surgeon: Wilhelmina Mcardle, MD;  Location: [facility] ORS;  Service: Pulmonary;  Laterality: N/A;   INCISION AND DRAINAGE / EXCISION THYROGLOSSAL CYST  April 2011   PORTA CATH INSERTION N/A 03/03/2021   Procedure: PORTA CATH INSERTION;  Surgeon: Algernon Huxley, MD;  Location: [place] [facility] LAB;  Service: Cardiovascular;  Laterality: N/A;   VIDEO BRONCHOSCOPY WITH ENDOBRONCHIAL ULTRASOUND N/A 01/12/2021   Procedure: VIDEO BRONCHOSCOPY WITH ENDOBRONCHIAL ULTRASOUND;  Surgeon: Tyler Pita, MD;  Location: [facility] ORS;  Service: Cardiopulmonary;  Laterality: N/A;    SOCIAL HISTORY: Social History   Socioeconomic History   Marital status: Married    Spouse name: Deidre Ala   Number of children: Not on file   Years of education: Not on file   Highest education level: 9th grade  Occupational History   Occupation: retired   Tobacco Use   Smoking status: Every Day    Packs/day: 0.50    Years: 50.00    Pack years: 25.00    Types: Cigarettes   Smokeless tobacco: Never   Tobacco comments:  0.5 PPD 02/10/2021  Vaping Use   Vaping Use: Former  Substance and Sexual Activity   Alcohol use: No    Alcohol/week: 0.0 standard drinks   Drug use: No   Sexual activity: Yes  Other Topics Concern   Not on file  Social History  Narrative   Lives at home with husband   Social Determinants of Health   Financial Resource Strain: Low Risk    Difficulty of Paying Living Expenses: Not hard at all  Food Insecurity: No Food Insecurity   Worried About Charity fundraiser in the Last Year: Never true   [place] in the Last Year: Never true  Transportation Needs: No Transportation Needs   Lack of Transportation (Medical): No   Lack of Transportation (Non-Medical): No  Physical Activity: Inactive   Days of Exercise per Week: 0 days   Minutes of Exercise per Session: 0 min  Stress: No Stress Concern Present   Feeling of Stress : Not at all  Social Connections: Moderately Isolated   Frequency of Communication with Friends and Family: More than three times a week   Frequency of Social Gatherings with Friends and Family: More than three times a week   Attends Religious Services: Never   Marine scientist or Organizations: No   Attends Music therapist: Never   Marital Status: Married  Human resources officer Violence: Not At Risk   Fear of Current or Ex-Partner: No   Emotionally Abused: No   Physically Abused: No   Sexually Abused: No    FAMILY HISTORY: Family History  Problem Relation Age of Onset   Congestive Heart Failure Mother    Heart disease Mother    Hypertension Mother    Hypertension Sister    Diabetes Sister    Heart disease Sister    Heart disease Maternal Uncle    Heart disease Maternal Grandmother    Stroke Maternal Grandfather    Cancer Brother        liver, lung   Heart disease Brother    Hypertension Brother    Heart attack Brother    COPD Neg Hx    Breast cancer Neg Hx     ALLERGIES:  is allergic to atorvastatin.  MEDICATIONS:  Current Outpatient Medications  Medication Sig Dispense Refill   acetaminophen (TYLENOL) 500 MG tablet Take 500 mg by mouth every 6 (six) hours as needed for moderate pain.     alendronate (FOSAMAX) 70 MG tablet Take 1 tablet (70 mg  total) by mouth every 7 (seven) days. Take with a full glass of water on an empty stomach. 12 tablet 3   chlorhexidine (PERIDEX) 0.12 % solution Use as directed 5 mLs in the mouth or throat 2 (two) times daily. 120 mL 0   clopidogrel (PLAVIX) 75 MG tablet Take 1 tablet (75 mg total) by mouth daily. 90 tablet 3   cyclobenzaprine (FLEXERIL) 10 MG tablet TAKE 1 TABLET AT BEDTIME 60 tablet 3   dexamethasone (DECADRON) 4 MG tablet Take 2 tablets (8 mg total) by mouth daily. Take for 2 days after chemotherapy. . 30 tablet 1   ferrous sulfate 325 (65 FE) MG EC tablet Take 1 tablet (325 mg total) by mouth daily with breakfast. 90 tablet 4   Fluticasone-Umeclidin-Vilant (TRELEGY ELLIPTA) 100-62.5-25 MCG/INH AEPB Inhale 1 puff into the lungs daily. 1 each 11   gabapentin (NEURONTIN) 100 MG capsule Take 1 capsule (100 mg total) by mouth 3 (three) times daily. [place]  capsule 1   levothyroxine (SYNTHROID) 75 MCG tablet Take 1 tablet (75 mcg total) by mouth daily. 90 tablet 3   lidocaine-prilocaine (EMLA) cream Apply 1 application topically as needed. 60 g 3   lisinopril (ZESTRIL) 20 MG tablet Take 1 tablet (20 mg total) by mouth daily. (Patient taking differently: Take 20 mg by mouth every evening.) 90 tablet 1   montelukast (SINGULAIR) 10 MG tablet Take 1 tablet (10 mg total) by mouth See admin instructions. Take 49m on the day before chemotherapy 30 tablet 0   Multiple Vitamin (MULTIVITAMIN) tablet Take 1 tablet by mouth daily.     naproxen (NAPROSYN) 500 MG tablet TAKE 1 TABLET TWICE DAILY WITH MEALS 180 tablet 1   omeprazole (PRILOSEC) 20 MG capsule Take 1 capsule (20 mg total) by mouth daily. (Patient taking differently: Take 20 mg by mouth daily before lunch.) 90 capsule 1   ondansetron (ZOFRAN) 8 MG tablet Take 1 tablet (8 mg total) by mouth 2 (two) times daily as needed for refractory nausea / vomiting. Start on day 3 after chemo. 30 tablet 1   vitamin B-12 (CYANOCOBALAMIN) 500 MCG tablet Take 500 mcg by  mouth daily.     No current facility-administered medications for this visit.   Facility-Administered Medications Ordered in Other Visits  Medication Dose Route Frequency Provider Last Rate Last Admin   CARBOplatin (PARAPLATIN) 150 mg in sodium chloride 0.9 % 100 mL chemo infusion  150 mg Intravenous Once YEarlie Server MD       dexamethasone (DECADRON) 10 mg in sodium chloride 0.9 % 50 mL IVPB  10 mg Intravenous Once YEarlie Server MD       famotidine (PEPCID) IVPB 20 mg premix  20 mg Intravenous Once YEarlie Server MD 200 mL/hr at 03/21/21 0925 20 mg at 03/21/21 0925   heparin lock flush 100 unit/mL  500 Units Intravenous Once YEarlie Server MD       heparin lock flush 100 unit/mL  500 Units Intracatheter Once PRN YEarlie Server MD       PACLitaxel (TAXOL) 78 mg in sodium chloride 0.9 % 250 mL chemo infusion (</= 856mm2)  45 mg/m2 (Order-Specific) Intravenous Once YuEarlie ServerMD       sodium chloride flush (NS) 0.9 % injection 10 mL  10 mL Intracatheter PRN YuEarlie ServerMD         PHYSICAL EXAMINATION: ECOG PERFORMANCE STATUS: 1 - Symptomatic but completely ambulatory Vitals:   03/21/21 0842  BP: (!) 143/71  Pulse: 95  Resp: 18  Temp: (!) 97.2 F (36.2 C)  SpO2: 100%   Filed Weights   03/21/21 0842  Weight: 139 lb 6.4 oz (63.2 kg)    Physical Exam Constitutional:      General: She is not in acute distress. HENT:     Head: Normocephalic and atraumatic.  Eyes:     General: No scleral icterus.    Pupils: Pupils are equal, round, and reactive to light.  Cardiovascular:     Rate and Rhythm: Normal rate and regular rhythm.     Heart sounds: Normal heart sounds.  Pulmonary:     Effort: Pulmonary effort is normal. No respiratory distress.     Breath sounds: No wheezing.     Comments: Decreased breath sound bilaterally  Abdominal:     General: Bowel sounds are normal. There is no distension.     Palpations: Abdomen is soft. There is no mass.     Tenderness: There is no abdominal tenderness.  Musculoskeletal:        General: No deformity. Normal range of motion.     Cervical back: Normal range of motion and neck supple.  Skin:    General: Skin is warm and dry.     Findings: No erythema or rash.  Neurological:     Mental Status: She is alert and oriented to person, place, and time. Mental status is at baseline.     Cranial Nerves: No cranial nerve deficit.     Coordination: Coordination normal.  Psychiatric:        Mood and Affect: Mood normal.    LABORATORY DATA:  I have reviewed the data as listed Lab Results  Component Value Date   WBC 4.2 03/21/2021   HGB 8.5 (L) 03/21/2021   HCT 25.3 (L) 03/21/2021   MCV 99.2 03/21/2021   PLT 273 03/21/2021   Recent Labs    03/07/21 0832 03/14/21 0849 03/21/21 0826  NA 132* 129* 130*  K 4.0 4.7 4.5  CL 102 100 106  CO2 24 20* 20*  GLUCOSE 95 123* 101*  BUN 25* 21 22  CREATININE 1.03* 1.04* 0.93  CALCIUM 8.5* 8.7* 7.9*  GFRNONAA 57* 57* >60  PROT 6.6 6.4* 6.2*  ALBUMIN 3.5 3.2* 3.3*  AST _0 ALT _1 ALKPHOS 56 66 68  BILITOT 0.3 0.2* 0.1*    Iron/TIBC/Ferritin/ %Sat    Component Value Date/Time   IRON 55 10/31/2016 1546   TIBC 339 10/31/2016 1546   FERRITIN 18 10/31/2016 1546   IRONPCTSAT 16 10/31/2016 1546      RADIOGRAPHIC STUDIES: I have personally reviewed the radiological images as listed and agreed with the findings in the report. MR Brain W Wo Contrast  Result Date: 02/01/2021 CLINICAL DATA:  Malignant neoplasm of overlapping sites of cervix ([place]) C53.8 (ICD-10-CM). Lung cancer follow up. EXAM: MRI HEAD WITHOUT AND WITH CONTRAST TECHNIQUE: Multiplanar, multiecho pulse sequences of the brain and surrounding structures were obtained without and with intravenous contrast. CONTRAST:  9m GADAVIST GADOBUTROL 1 MMOL/ML IV SOLN COMPARISON:  MRI of the brain August 18, 2004. FINDINGS: Brain: No acute infarction, hemorrhage, hydrocephalus, extra-axial collection or mass lesion. Scattered foci of T2  hyperintensity are seen within the white matter of cerebral hemispheres, most likely small vessel ischemia, mildly progressed since prior MRI. A few punctate foci of susceptibility artifact are seen scattered in the right cerebral hemisphere, may represent hemosiderin deposits. No focus of abnormal contrast enhancement to suggest intracranial metastatic disease. Vascular: Normal flow voids. Skull and upper cervical spine: Normal marrow signal. Sinuses/Orbits: Negative. Other: Trace mucosal thickening in the bilateral mastoid cells. Epidermal inclusion cyst measuring 1.4 cm posteriorly in the neck subcutaneous. IMPRESSION: 1. No evidence of intracranial metastatic disease. 2. Mild-to-moderate chronic microvascular ischemic changes of the white. Electronically Signed   By: KPedro EarlsM.D.   On: 02/01/2021 13:53   MR Lumbar Spine Wo Contrast  Result Date: 01/21/2021 CLINICAL DATA:  Low back pain, increased fracture risk ?sacral insuffiency fracture on PET scan EXAM: MRI LUMBAR SPINE WITHOUT CONTRAST TECHNIQUE: Multiplanar, multisequence MR imaging of the lumbar spine was performed. No intravenous contrast was administered. COMPARISON:  PET-CT 12/13/2020, x-ray 05/20/2020 FINDINGS: Segmentation: Transitional lumbosacral anatomy with partial sacralization of the L5 segment. Lowest rib-bearing segment is labeled as T12 (axial series 8, image 6). Alignment:  Trace retrolisthesis of L4 on L5. Vertebrae: Mild superior endplate compression fracture of L5 with approximately 15% vertebral body height loss. Mild  bone marrow edema within the L5 vertebral body. There is extensive bone marrow edema within the bilateral sacral ala associated with bilateral sacral fractures, likely subacute given findings on previous PET-CT. Remaining vertebral bodies of the lumbar spine are intact with preserved height. No additional fracture. No evidence of discitis. No suspicious marrow replacing bone lesion. Conus  medullaris and cauda equina: Conus extends to the L1 level. Conus and cauda equina appear normal. Paraspinal and other soft tissues: Negative. Disc levels: T12-L1: Minimal disc bulge. Mild bilateral facet arthropathy. No foraminal or canal stenosis. L1-L2: Minimal disc bulge with small left foraminal protrusion. Mild bilateral facet hypertrophy. No foraminal or canal stenosis. L2-L3: Mild annular disc bulge. Mild bilateral facet arthropathy with ligamentum flavum buckling. Findings result in mild canal stenosis. No significant foraminal stenosis. L3-L4: Mild annular disc bulge. Mild bilateral facet arthropathy with ligamentum flavum buckling. Findings result in mild canal stenosis. No significant foraminal stenosis. L4-L5: Retrolisthesis with disc bulge and endplate ridging. Mild-moderate bilateral facet arthropathy with ligamentum flavum buckling. Mild bilateral subarticular recess stenosis without canal stenosis. No significant foraminal stenosis. L5-S1: Transitional level.  No evidence of impingement. IMPRESSION: 1. Transitional lumbosacral anatomy, as above. 2. Subacute-appearing mild superior endplate compression fracture of L5 with approximately 15% vertebral body height loss. 3. Extensive bone marrow edema within the bilateral sacral ala associated with bilateral sacral fractures, likely subacute given findings on previous PET-CT. 4. Mild multilevel degenerative changes of the lumbar spine as described. 5. Mild canal stenosis at L2-3 and L3-4. 6. Mild bilateral subarticular recess stenosis at L4-5. Electronically Signed   By: Davina Poke D.O.   On: 01/21/2021 09:10   PERIPHERAL VASCULAR CATHETERIZATION  Result Date: 03/03/2021 See surgical note for result.  DG Bone Density  Result Date: 01/25/2021 EXAM: DUAL X-RAY ABSORPTIOMETRY (DXA) FOR BONE MINERAL DENSITY IMPRESSION: crr Your patient Iana Buzan completed a BMD test on 01/25/2021 using the [place] (analysis  version: 14.10) manufactured by [employer]. The following summarizes the results of our evaluation. PATIENT BIOGRAPHICAL: Name: Brendi, Mccarroll Patient ID: [id number] Birth Date: November 15, 1945 Height: 67.2 in. Gender: Female Exam Date: 01/25/2021 Weight: 132.2 lbs. Indications: Advanced Age, arthritis, Caucasian, copd, emphysema, Height Loss, History of Fracture (Adult), hx cervical ca, hypothyroid, Low Body Weight, lung ca, POSTmenopausal, Tobacco User Fractures: coccyx Treatments: GABAPENTIN, levothyroxine, multivitamin, plavix, trelegy ASSESSMENT: The BMD measured at Femur Total Right is 0.522 g/cm2 with a T-score of -3.9. This patient is considered osteoporotic according to [facility]) criteria. Lumbar spine was not utilized due to advanced degenerative changes.The scan quality is limited by exclusion of L-spine. Site Region Measured Measured WHO Young Adult BMD Date       Age      Classification T-score DualFemur Total Right 01/25/2023 73.8 Osteoporosis -3.9 0.522 g/cm2 Left Forearm Radius 33% 01/25/2021 73.8 Osteoporosis -3.7 0.556 g/cm2 World Health Organization [facility]) criteria for post-menopausal, Caucasian Women: Normal:       T-score at or above -1 SD Osteopenia:   T-score between -1 and -2.5 SD Osteoporosis: T-score at or below -2.5 SD RECOMMENDATIONS: 1. All patients should optimize calcium and vitamin D intake. 2. Consider FDA-approved medical therapies in postmenopausal women and men aged 90 years and older, based on the following: a. A hip or vertebral (clinical or morphometric) fracture b. T-score < -2.5 at the femoral neck or spine after appropriate evaluation to exclude secondary causes c. Low bone mass (T-score between -1.0 and -2.5 at the femoral neck or spine)  and a 10-year probability of a hip fracture > 3% or a 10-year probability of a major osteoporosis-related fracture > 20% based on the [place]-adapted WHO algorithm d. Clinician judgment and/or patient preferences may  indicate treatment for people with 10-year fracture probabilities above or below these levels FOLLOW-UP: People with diagnosed cases of osteoporosis or at high risk for fracture should have regular bone mineral density tests. For patients eligible for Medicare, routine testing is allowed once every 2 years. The testing frequency can be increased to one year for patients who have rapidly progressing disease, those who are receiving or discontinuing medical therapy to restore bone mass, or have additional risk factors. I have reviewed this report, and agree with the above findings. [facility] Electronically Signed   By: Franki Cabot M.D.   On: 01/25/2021 15:11   MM 3D SCREEN BREAST BILATERAL  Result Date: 01/26/2021 CLINICAL DATA:  Screening. EXAM: DIGITAL SCREENING BILATERAL MAMMOGRAM WITH TOMOSYNTHESIS AND CAD TECHNIQUE: Bilateral screening digital craniocaudal and mediolateral oblique mammograms were obtained. Bilateral screening digital breast tomosynthesis was performed. The images were evaluated with computer-aided detection. COMPARISON:  None. ACR Breast Density Category c: The breast tissue is heterogeneously dense, which may obscure small masses. FINDINGS: In the right breast, calcifications warrant further evaluation with magnified views. In the left breast, no findings suspicious for malignancy. IMPRESSION: Further evaluation is suggested for calcifications in the right breast. RECOMMENDATION: Diagnostic mammogram of the right breast. (Code:[id number]) The patient will be contacted regarding the findings, and additional imaging will be scheduled. BI-RADS CATEGORY  0: Incomplete. Need additional imaging evaluation and/or prior mammograms for comparison. Electronically Signed   By: Nolon Nations M.D.   On: 01/26/2021 07:35      ASSESSMENT & PLAN:  1. Primary adenocarcinoma of right lung ([place])   2. Encounter for antineoplastic chemotherapy   3. Anemia due to antineoplastic chemotherapy    4. Malignant neoplasm of overlapping sites of cervix ([place])   5. Hypocalcemia    Cancer Staging  Cervical cancer ([place]) Staging form: Cervix Uteri, AJCC Version 9 - Clinical: FIGO Stage IIICr (cTX, cN1) - Unsigned  Primary lung adenocarcinoma ([place]) Staging form: Lung, AJCC 7th Edition - Clinical stage from 01/12/2021: T1, N1, M0 - Signed by [name], MD on 01/29/2021   #Stage III lung adenocarcinoma, recurrent  On concurrent chemotherapy and radiation. Labs are reviewed and discussed with patient. Proceed with weekly carboplatin/ Taxol today .  #Hypertension, Continue current BP regimen.  #Chemotherapy-induced, hemoglobin has decreased, and is stable comparing last week.  Close monitor.  Discussed with patient about possible need of blood transfusion next week.  She agrees.  # Locally advanced cervical cancer stage IIIC1 with pelvic nodes involvement. S/p concurrent chemotherapy and radiation followed by brachytherapy.  She is not actively following up with gynecology oncology.  I recommend patient to reestablish care. # Hypocalcemia, recently started on Fosamax. Recommend calcium supplementation 1266m daily.- otc supply.   All questions were answered. The patient knows to call the clinic with any problems questions or concerns.   Return of visit:  1 week lab MD carboplatin Taxol.   ZEarlie Server MD, PhD 03/21/2021

## 2021-03-21 NOTE — Progress Notes (Signed)
Patient here for follow up and tx. Pt reports occasional pain to mid upper abdomen. She also reports that she had some swelling to feet and legs yesterday.

## 2021-03-21 NOTE — Patient Instructions (Signed)
[facility] AT [place]  Discharge Instructions: Thank you for choosing [facility] [facility] to provide your oncology and hematology care.  If you have a lab appointment with the [facility], please go directly to the [facility] and check in at the registration area.  Wear comfortable clothing and clothing appropriate for easy access to any Portacath or PICC line.   We strive to give you quality time with your provider. You may need to reschedule your appointment if you arrive late (15 or more minutes).  Arriving late affects you and other patients whose appointments are after yours.  Also, if you miss three or more appointments without notifying the office, you may be dismissed from the clinic at the providers discretion.      For prescription refill requests, have your pharmacy contact our office and allow 72 hours for refills to be completed.    Today you received the following chemotherapy and/or immunotherapy agents Carboplatin &Taxol      To help prevent nausea and vomiting after your treatment, we encourage you to take your nausea medication as directed.  BELOW ARE SYMPTOMS THAT SHOULD BE REPORTED IMMEDIATELY: *FEVER GREATER THAN 100.4 F (38 C) OR HIGHER *CHILLS OR SWEATING *NAUSEA AND VOMITING THAT IS NOT CONTROLLED WITH YOUR NAUSEA MEDICATION *UNUSUAL SHORTNESS OF BREATH *UNUSUAL BRUISING OR BLEEDING *URINARY PROBLEMS (pain or burning when urinating, or frequent urination) *BOWEL PROBLEMS (unusual diarrhea, constipation, pain near the anus) TENDERNESS IN MOUTH AND THROAT WITH OR WITHOUT PRESENCE OF ULCERS (sore throat, sores in mouth, or a toothache) UNUSUAL RASH, SWELLING OR PAIN  UNUSUAL VAGINAL DISCHARGE OR ITCHING   Items with * indicate a potential emergency and should be followed up as soon as possible or go to the Emergency Department if any problems should occur.  Please show the CHEMOTHERAPY ALERT CARD or IMMUNOTHERAPY ALERT CARD at  check-in to the Emergency Department and triage nurse.  Should you have questions after your visit or need to cancel or reschedule your appointment, please contact [facility] [place] [facility] [place]  [phone] and follow the prompts.  Office hours are 8:00 a.m. to 4:30 p.m. Monday - Friday. Please note that voicemails left after 4:00 p.m. may not be returned until the following business day.  We are closed weekends and major holidays. You have access to a nurse at all times for urgent questions. Please call the main number to the clinic [phone] and follow the prompts.  For any non-urgent questions, you may also contact your provider using MyChart. We now offer e-Visits for anyone 66 and older to request care online for non-urgent symptoms. For details visit [email or web address].   Also download the MyChart app! Go to the app store, search "MyChart", open the app, select [facility], and log in with your MyChart username and password.  Due to Covid, a mask is required upon entering the hospital/clinic. If you do not have a mask, one will be given to you upon arrival. For doctor visits, patients may have 1 support person aged 65 or older with them. For treatment visits, patients cannot have anyone with them due to current Covid guidelines and our immunocompromised population.

## 2021-03-21 NOTE — Progress Notes (Signed)
Calcium 7.9. Per Darci Current., RN, Dr. Tasia Catchings has informed patient to take oral supplement.

## 2021-03-22 ENCOUNTER — Ambulatory Visit
Admission: RE | Admit: 2021-03-22 | Discharge: 2021-03-22 | Disposition: A | Discharge status: Home / Self Care | Payer: Medicare PPO | Source: Ambulatory Visit | Attending: Radiation Oncology | Admitting: Radiation Oncology

## 2021-03-22 DIAGNOSIS — T451X5A Adverse effect of antineoplastic and immunosuppressive drugs, initial encounter: Secondary | ICD-10-CM | POA: Diagnosis not present

## 2021-03-22 DIAGNOSIS — C539 Malignant neoplasm of cervix uteri, unspecified: Secondary | ICD-10-CM | POA: Diagnosis not present

## 2021-03-22 DIAGNOSIS — I10 Essential (primary) hypertension: Secondary | ICD-10-CM | POA: Diagnosis not present

## 2021-03-22 DIAGNOSIS — Z510 Encounter for antineoplastic radiation therapy: Secondary | ICD-10-CM | POA: Diagnosis not present

## 2021-03-22 DIAGNOSIS — C3491 Malignant neoplasm of unspecified part of right bronchus or lung: Secondary | ICD-10-CM | POA: Diagnosis not present

## 2021-03-22 DIAGNOSIS — Z79899 Other long term (current) drug therapy: Secondary | ICD-10-CM | POA: Diagnosis not present

## 2021-03-22 DIAGNOSIS — D6481 Anemia due to antineoplastic chemotherapy: Secondary | ICD-10-CM | POA: Diagnosis not present

## 2021-03-22 DIAGNOSIS — Z5111 Encounter for antineoplastic chemotherapy: Secondary | ICD-10-CM | POA: Diagnosis not present

## 2021-03-22 DIAGNOSIS — C3432 Malignant neoplasm of lower lobe, left bronchus or lung: Secondary | ICD-10-CM | POA: Diagnosis not present

## 2021-03-22 DIAGNOSIS — C770 Secondary and unspecified malignant neoplasm of lymph nodes of head, face and neck: Secondary | ICD-10-CM | POA: Diagnosis not present

## 2021-03-23 ENCOUNTER — Ambulatory Visit
Admission: RE | Admit: 2021-03-23 | Discharge: 2021-03-23 | Disposition: A | Discharge status: Home / Self Care | Payer: Medicare PPO | Source: Ambulatory Visit | Attending: Radiation Oncology | Admitting: Radiation Oncology

## 2021-03-23 DIAGNOSIS — C3491 Malignant neoplasm of unspecified part of right bronchus or lung: Secondary | ICD-10-CM | POA: Diagnosis not present

## 2021-03-23 DIAGNOSIS — Z79899 Other long term (current) drug therapy: Secondary | ICD-10-CM | POA: Diagnosis not present

## 2021-03-23 DIAGNOSIS — Z5111 Encounter for antineoplastic chemotherapy: Secondary | ICD-10-CM | POA: Diagnosis not present

## 2021-03-23 DIAGNOSIS — T451X5A Adverse effect of antineoplastic and immunosuppressive drugs, initial encounter: Secondary | ICD-10-CM | POA: Diagnosis not present

## 2021-03-23 DIAGNOSIS — D6481 Anemia due to antineoplastic chemotherapy: Secondary | ICD-10-CM | POA: Diagnosis not present

## 2021-03-23 DIAGNOSIS — C3432 Malignant neoplasm of lower lobe, left bronchus or lung: Secondary | ICD-10-CM | POA: Diagnosis not present

## 2021-03-23 DIAGNOSIS — Z510 Encounter for antineoplastic radiation therapy: Secondary | ICD-10-CM | POA: Diagnosis not present

## 2021-03-23 DIAGNOSIS — C770 Secondary and unspecified malignant neoplasm of lymph nodes of head, face and neck: Secondary | ICD-10-CM | POA: Diagnosis not present

## 2021-03-23 DIAGNOSIS — I10 Essential (primary) hypertension: Secondary | ICD-10-CM | POA: Diagnosis not present

## 2021-03-23 DIAGNOSIS — C539 Malignant neoplasm of cervix uteri, unspecified: Secondary | ICD-10-CM | POA: Diagnosis not present

## 2021-03-24 ENCOUNTER — Ambulatory Visit
Admission: RE | Admit: 2021-03-24 | Discharge: 2021-03-24 | Disposition: A | Discharge status: Home / Self Care | Payer: Medicare PPO | Source: Ambulatory Visit | Attending: Radiation Oncology | Admitting: Radiation Oncology

## 2021-03-24 DIAGNOSIS — D6481 Anemia due to antineoplastic chemotherapy: Secondary | ICD-10-CM | POA: Diagnosis not present

## 2021-03-24 DIAGNOSIS — C3432 Malignant neoplasm of lower lobe, left bronchus or lung: Secondary | ICD-10-CM | POA: Diagnosis not present

## 2021-03-24 DIAGNOSIS — C3491 Malignant neoplasm of unspecified part of right bronchus or lung: Secondary | ICD-10-CM | POA: Diagnosis not present

## 2021-03-24 DIAGNOSIS — Z510 Encounter for antineoplastic radiation therapy: Secondary | ICD-10-CM | POA: Diagnosis not present

## 2021-03-24 DIAGNOSIS — Z79899 Other long term (current) drug therapy: Secondary | ICD-10-CM | POA: Diagnosis not present

## 2021-03-24 DIAGNOSIS — C770 Secondary and unspecified malignant neoplasm of lymph nodes of head, face and neck: Secondary | ICD-10-CM | POA: Diagnosis not present

## 2021-03-24 DIAGNOSIS — C539 Malignant neoplasm of cervix uteri, unspecified: Secondary | ICD-10-CM | POA: Diagnosis not present

## 2021-03-24 DIAGNOSIS — Z5111 Encounter for antineoplastic chemotherapy: Secondary | ICD-10-CM | POA: Diagnosis not present

## 2021-03-24 DIAGNOSIS — I10 Essential (primary) hypertension: Secondary | ICD-10-CM | POA: Diagnosis not present

## 2021-03-24 DIAGNOSIS — T451X5A Adverse effect of antineoplastic and immunosuppressive drugs, initial encounter: Secondary | ICD-10-CM | POA: Diagnosis not present

## 2021-03-24 LAB — SAMPLE TO BLOOD BANK

## 2021-03-25 ENCOUNTER — Ambulatory Visit
Admission: RE | Admit: 2021-03-25 | Discharge: 2021-03-25 | Disposition: A | Discharge status: Home / Self Care | Payer: Medicare PPO | Source: Ambulatory Visit | Attending: Radiation Oncology | Admitting: Radiation Oncology

## 2021-03-25 ENCOUNTER — Other Ambulatory Visit: Payer: Self-pay

## 2021-03-25 ENCOUNTER — Encounter: Payer: Self-pay | Admitting: Family Medicine

## 2021-03-25 ENCOUNTER — Ambulatory Visit (INDEPENDENT_AMBULATORY_CARE_PROVIDER_SITE_OTHER): Payer: Medicare PPO | Admitting: Family Medicine

## 2021-03-25 VITALS — BP 132/75 | HR 96 | Temp 98.2°F | Wt 141.0 lb

## 2021-03-25 DIAGNOSIS — C3491 Malignant neoplasm of unspecified part of right bronchus or lung: Secondary | ICD-10-CM | POA: Diagnosis not present

## 2021-03-25 DIAGNOSIS — I10 Essential (primary) hypertension: Secondary | ICD-10-CM | POA: Diagnosis not present

## 2021-03-25 DIAGNOSIS — M549 Dorsalgia, unspecified: Secondary | ICD-10-CM | POA: Diagnosis not present

## 2021-03-25 DIAGNOSIS — R1012 Left upper quadrant pain: Secondary | ICD-10-CM

## 2021-03-25 DIAGNOSIS — C770 Secondary and unspecified malignant neoplasm of lymph nodes of head, face and neck: Secondary | ICD-10-CM | POA: Diagnosis not present

## 2021-03-25 DIAGNOSIS — C3432 Malignant neoplasm of lower lobe, left bronchus or lung: Secondary | ICD-10-CM | POA: Diagnosis not present

## 2021-03-25 DIAGNOSIS — E034 Atrophy of thyroid (acquired): Secondary | ICD-10-CM | POA: Diagnosis not present

## 2021-03-25 DIAGNOSIS — Z510 Encounter for antineoplastic radiation therapy: Secondary | ICD-10-CM | POA: Diagnosis not present

## 2021-03-25 DIAGNOSIS — Z5111 Encounter for antineoplastic chemotherapy: Secondary | ICD-10-CM | POA: Diagnosis not present

## 2021-03-25 DIAGNOSIS — C539 Malignant neoplasm of cervix uteri, unspecified: Secondary | ICD-10-CM | POA: Diagnosis not present

## 2021-03-25 DIAGNOSIS — Z79899 Other long term (current) drug therapy: Secondary | ICD-10-CM | POA: Diagnosis not present

## 2021-03-25 DIAGNOSIS — T451X5A Adverse effect of antineoplastic and immunosuppressive drugs, initial encounter: Secondary | ICD-10-CM | POA: Diagnosis not present

## 2021-03-25 DIAGNOSIS — D6481 Anemia due to antineoplastic chemotherapy: Secondary | ICD-10-CM | POA: Diagnosis not present

## 2021-03-25 MED ORDER — GABAPENTIN 100 MG PO CAPS: ORAL_CAPSULE | ORAL | 1 refills | Status: DC

## 2021-03-25 MED ORDER — LIDOCAINE 5 % EX PTCH
1.0000 | MEDICATED_PATCH | CUTANEOUS | 1 refills | Status: DC
Start: 2021-03-25 — End: 2023-02-20

## 2021-03-25 NOTE — Assessment & Plan Note (Signed)
Rechecking labs today. Await results.  

## 2021-03-25 NOTE — Progress Notes (Signed)
BP 132/75    Pulse 96    Temp 98.2 F (36.8 C)    Wt 141 lb (64 kg)    SpO2 95%    BMI 22.08 kg/m    Subjective:    Patient ID: Mary Hoover, female    DOB: July 04, 1945, 76 y.o.   MRN: [id number]  HPI: Mary Hoover is a 76 y.o. female  Chief Complaint  Patient presents with   Abdominal Pain    Patient states she has pain under her left breast when she coughs and bends over. Patient states sometimes it hurts even with just sitting and feels like a burning sensation. Pain started at least a year ago off and on. Pain has become more constant. Patient states she has gained weight and it all goes to her stomach.    ABDOMINAL PAIN  Duration: at least a year Onset: gradual Severity: severe Quality: sharp and burning Location:  LUQ and under L breast  Episode duration: intermittent, last a couple of hours  Radiation: no Frequency: with bending over Alleviating factors: rest Aggravating factors: coughing, moving, bending over, laying down Status: worse Treatments attempted: none Fever: no Nausea: no Vomiting: no Weight loss: no Decreased appetite: no Diarrhea: no Constipation: no Blood in stool: no Heartburn: no Jaundice: no Rash: no Dysuria/urinary frequency: no Hematuria: no History of sexually transmitted disease: no Recurrent NSAID use: no  Relevant past medical, surgical, family and social history reviewed and updated as indicated. Interim medical history since our last visit reviewed. Allergies and medications reviewed and updated.  Review of Systems  Constitutional: Negative.   Respiratory: Negative.    Cardiovascular: Negative.   Gastrointestinal: Negative.   Musculoskeletal:  Positive for back pain and myalgias. Negative for arthralgias, gait problem, joint swelling, neck pain and neck stiffness.  Skin: Negative.   Psychiatric/Behavioral: Negative.     Per HPI unless specifically indicated above     Objective:    BP 132/75    Pulse 96    Temp  98.2 F (36.8 C)    Wt 141 lb (64 kg)    SpO2 95%    BMI 22.08 kg/m   Wt Readings from Last 3 Encounters:  03/25/21 141 lb (64 kg)  03/21/21 139 lb 6.4 oz (63.2 kg)  03/14/21 138 lb 14.4 oz (63 kg)    Physical Exam Vitals and nursing note reviewed.  Constitutional:      General: She is not in acute distress.    Appearance: Normal appearance. She is not ill-appearing, toxic-appearing or diaphoretic.  HENT:     Head: Normocephalic and atraumatic.     Right Ear: External ear normal.     Left Ear: External ear normal.     Nose: Nose normal.     Mouth/Throat:     Mouth: Mucous membranes are moist.     Pharynx: Oropharynx is clear.  Eyes:     General: No scleral icterus.       Right eye: No discharge.        Left eye: No discharge.     Extraocular Movements: Extraocular movements intact.     Conjunctiva/sclera: Conjunctivae normal.     Pupils: Pupils are equal, round, and reactive to light.  Cardiovascular:     Rate and Rhythm: Normal rate and regular rhythm.     Pulses: Normal pulses.     Heart sounds: Normal heart sounds. No murmur heard.   No friction rub. No gallop.  Pulmonary:  Effort: Pulmonary effort is normal. No respiratory distress.     Breath sounds: Normal breath sounds. No stridor. No wheezing, rhonchi or rales.  Chest:     Chest wall: No tenderness.  Musculoskeletal:        General: Normal range of motion.     Cervical back: Normal range of motion and neck supple.  Skin:    General: Skin is warm and dry.     Capillary Refill: Capillary refill takes less than 2 seconds.     Coloration: Skin is not jaundiced or pale.     Findings: No bruising, erythema, lesion or rash.  Neurological:     General: No focal deficit present.     Mental Status: She is alert and oriented to person, place, and time. Mental status is at baseline.  Psychiatric:        Mood and Affect: Mood normal.        Behavior: Behavior normal.        Thought Content: Thought content normal.         Judgment: Judgment normal.    Results for orders placed or performed in visit on 03/21/21  Comprehensive metabolic panel  Result Value Ref Range   Sodium 130 (L) 135 - 145 mmol/L   Potassium 4.5 3.5 - 5.1 mmol/L   Chloride 106 98 - 111 mmol/L   CO2 20 (L) 22 - 32 mmol/L   Glucose, Bld 101 (H) 70 - 99 mg/dL   BUN 22 8 - 23 mg/dL   Creatinine, Ser 0.93 0.44 - 1.00 mg/dL   Calcium 7.9 (L) 8.9 - 10.3 mg/dL   Total Protein 6.2 (L) 6.5 - 8.1 g/dL   Albumin 3.3 (L) 3.5 - 5.0 g/dL   AST 19 15 - 41 U/L   ALT 16 0 - 44 U/L   Alkaline Phosphatase 68 38 - 126 U/L   Total Bilirubin 0.1 (L) 0.3 - 1.2 mg/dL   GFR, Estimated >60 >60 mL/min   Anion gap 4 (L) 5 - 15  CBC with Differential  Result Value Ref Range   WBC 4.2 4.0 - 10.5 K/uL   RBC 2.55 (L) 3.87 - 5.11 MIL/uL   Hemoglobin 8.5 (L) 12.0 - 15.0 g/dL   HCT 25.3 (L) 36.0 - 46.0 %   MCV 99.2 80.0 - 100.0 fL   MCH 33.3 26.0 - 34.0 pg   MCHC 33.6 30.0 - 36.0 g/dL   RDW 15.0 11.5 - 15.5 %   Platelets 273 150 - 400 K/uL   nRBC 0.0 0.0 - 0.2 %   Neutrophils Relative % 82 %   Neutro Abs 3.4 1.7 - 7.7 K/uL   Lymphocytes Relative 9 %   Lymphs Abs 0.4 (L) 0.7 - 4.0 K/uL   Monocytes Relative 5 %   Monocytes Absolute 0.2 0.1 - 1.0 K/uL   Eosinophils Relative 1 %   Eosinophils Absolute 0.0 0.0 - 0.5 K/uL   Basophils Relative 1 %   Basophils Absolute 0.0 0.0 - 0.1 K/uL   Immature Granulocytes 2 %   Abs Immature Granulocytes 0.10 (H) 0.00 - 0.07 K/uL  Sample to Blood Bank  Result Value Ref Range   Blood Bank Specimen SAMPLE AVAILABLE FOR TESTING    Sample Expiration      03/24/2021,2359 Performed at [facility], [address], [place], [place] [address]       Assessment & Plan:   Problem List Items Addressed This Visit       Endocrine  Hypothyroidism    Rechecking labs today. Await results.       Relevant Orders   TSH   Other Visit Diagnoses     LUQ pain    -  Primary   Concern for irritation of nerve  around rib. Will check x-rays and treat with lidocaine and increased gabapentin. Reheck 1 month. Call with any concerns.    Relevant Orders   CBC with Differential/Platelet   Comprehensive metabolic panel   DG Chest 2 View   DG Thoracic Spine W/Swimmers   Upper back pain       See discussion under LUQ pain.   Relevant Orders   DG Chest 2 View   DG Thoracic Spine W/Swimmers        Follow up plan: Return in about 4 weeks (around 04/22/2021).

## 2021-03-26 ENCOUNTER — Encounter: Payer: Self-pay | Admitting: Oncology

## 2021-03-26 LAB — CBC WITH DIFFERENTIAL/PLATELET
Basophils Absolute: 0 10*3/uL (ref 0.0–0.2)
Basos: 1 %
EOS (ABSOLUTE): 0 10*3/uL (ref 0.0–0.4)
Eos: 2 %
Hematocrit: 26.8 % — ABNORMAL LOW (ref 34.0–46.6)
Hemoglobin: 9.3 g/dL — ABNORMAL LOW (ref 11.1–15.9)
Immature Grans (Abs): 0 10*3/uL (ref 0.0–0.1)
Immature Granulocytes: 1 %
Lymphocytes Absolute: 0.3 10*3/uL — ABNORMAL LOW (ref 0.7–3.1)
Lymphs: 15 %
MCH: 33.5 pg — ABNORMAL HIGH (ref 26.6–33.0)
MCHC: 34.7 g/dL (ref 31.5–35.7)
MCV: 96 fL (ref 79–97)
Monocytes Absolute: 0.1 10*3/uL (ref 0.1–0.9)
Monocytes: 5 %
Neutrophils Absolute: 1.5 10*3/uL (ref 1.4–7.0)
Neutrophils: 76 %
Platelets: 326 10*3/uL (ref 150–450)
RBC: 2.78 x10E6/uL — ABNORMAL LOW (ref 3.77–5.28)
RDW: 15.3 % (ref 11.7–15.4)
WBC: 2 10*3/uL — CL (ref 3.4–10.8)

## 2021-03-26 LAB — COMPREHENSIVE METABOLIC PANEL
ALT: 19 IU/L (ref 0–32)
AST: 16 IU/L (ref 0–40)
Albumin/Globulin Ratio: 1.5 (ref 1.2–2.2)
Albumin: 3.9 g/dL (ref 3.7–4.7)
Alkaline Phosphatase: 93 IU/L (ref 44–121)
BUN/Creatinine Ratio: 16 (ref 12–28)
BUN: 16 mg/dL (ref 8–27)
Bilirubin Total: 0.3 mg/dL (ref 0.0–1.2)
CO2: 20 mmol/L (ref 20–29)
Calcium: 8.7 mg/dL (ref 8.7–10.3)
Chloride: 102 mmol/L (ref 96–106)
Creatinine, Ser: 0.98 mg/dL (ref 0.57–1.00)
Globulin, Total: 2.6 g/dL (ref 1.5–4.5)
Glucose: 86 mg/dL (ref 70–99)
Potassium: 5 mmol/L (ref 3.5–5.2)
Sodium: 134 mmol/L (ref 134–144)
Total Protein: 6.5 g/dL (ref 6.0–8.5)
eGFR: 61 mL/min/{1.73_m2} (ref >59)

## 2021-03-26 LAB — TSH: TSH: 4.65 u[IU]/mL — ABNORMAL HIGH (ref 0.450–4.500)

## 2021-03-28 ENCOUNTER — Other Ambulatory Visit: Payer: Self-pay | Admitting: Family Medicine

## 2021-03-28 ENCOUNTER — Ambulatory Visit: Payer: Medicare PPO

## 2021-03-28 ENCOUNTER — Inpatient Hospital Stay (HOSPITAL_BASED_OUTPATIENT_CLINIC_OR_DEPARTMENT_OTHER): Payer: Medicare PPO | Admitting: Oncology

## 2021-03-28 ENCOUNTER — Ambulatory Visit: Payer: Medicare PPO | Admitting: Oncology

## 2021-03-28 ENCOUNTER — Inpatient Hospital Stay: Payer: Medicare PPO

## 2021-03-28 ENCOUNTER — Ambulatory Visit
Admission: RE | Admit: 2021-03-28 | Discharge: 2021-03-28 | Disposition: A | Discharge status: Home / Self Care | Payer: Medicare PPO | Source: Ambulatory Visit | Attending: Radiation Oncology | Admitting: Radiation Oncology

## 2021-03-28 ENCOUNTER — Encounter: Payer: Self-pay | Admitting: Oncology

## 2021-03-28 ENCOUNTER — Other Ambulatory Visit: Payer: Medicare PPO

## 2021-03-28 ENCOUNTER — Other Ambulatory Visit: Payer: Self-pay

## 2021-03-28 VITALS — BP 149/73 | HR 91 | Temp 97.0°F | Wt 143.0 lb

## 2021-03-28 DIAGNOSIS — C539 Malignant neoplasm of cervix uteri, unspecified: Secondary | ICD-10-CM

## 2021-03-28 DIAGNOSIS — T451X5A Adverse effect of antineoplastic and immunosuppressive drugs, initial encounter: Secondary | ICD-10-CM | POA: Diagnosis not present

## 2021-03-28 DIAGNOSIS — D6481 Anemia due to antineoplastic chemotherapy: Secondary | ICD-10-CM | POA: Diagnosis not present

## 2021-03-28 DIAGNOSIS — C770 Secondary and unspecified malignant neoplasm of lymph nodes of head, face and neck: Secondary | ICD-10-CM | POA: Diagnosis not present

## 2021-03-28 DIAGNOSIS — E034 Atrophy of thyroid (acquired): Secondary | ICD-10-CM

## 2021-03-28 DIAGNOSIS — Z5111 Encounter for antineoplastic chemotherapy: Secondary | ICD-10-CM

## 2021-03-28 DIAGNOSIS — I10 Essential (primary) hypertension: Secondary | ICD-10-CM | POA: Diagnosis not present

## 2021-03-28 DIAGNOSIS — C3432 Malignant neoplasm of lower lobe, left bronchus or lung: Secondary | ICD-10-CM | POA: Diagnosis not present

## 2021-03-28 DIAGNOSIS — C3491 Malignant neoplasm of unspecified part of right bronchus or lung: Secondary | ICD-10-CM

## 2021-03-28 DIAGNOSIS — Z79899 Other long term (current) drug therapy: Secondary | ICD-10-CM | POA: Diagnosis not present

## 2021-03-28 DIAGNOSIS — Z510 Encounter for antineoplastic radiation therapy: Secondary | ICD-10-CM | POA: Diagnosis not present

## 2021-03-28 LAB — COMPREHENSIVE METABOLIC PANEL
ALT: 16 U/L (ref 0–44)
AST: 21 U/L (ref 15–41)
Albumin: 3.2 g/dL — ABNORMAL LOW (ref 3.5–5.0)
Alkaline Phosphatase: 65 U/L (ref 38–126)
Anion gap: 8 (ref 5–15)
BUN: 25 mg/dL — ABNORMAL HIGH (ref 8–23)
CO2: 25 mmol/L (ref 22–32)
Calcium: 9.3 mg/dL (ref 8.9–10.3)
Chloride: 98 mmol/L (ref 98–111)
Creatinine, Ser: 1.03 mg/dL — ABNORMAL HIGH (ref 0.44–1.00)
GFR, Estimated: 57 mL/min — ABNORMAL LOW (ref >60)
Glucose, Bld: 105 mg/dL — ABNORMAL HIGH (ref 70–99)
Potassium: 4.5 mmol/L (ref 3.5–5.1)
Sodium: 131 mmol/L — ABNORMAL LOW (ref 135–145)
Total Bilirubin: 0.1 mg/dL — ABNORMAL LOW (ref 0.3–1.2)
Total Protein: 6.2 g/dL — ABNORMAL LOW (ref 6.5–8.1)

## 2021-03-28 LAB — CBC WITH DIFFERENTIAL/PLATELET
Abs Immature Granulocytes: 0.23 10*3/uL — ABNORMAL HIGH (ref 0.00–0.07)
Basophils Absolute: 0.1 10*3/uL (ref 0.0–0.1)
Basophils Relative: 1 %
Eosinophils Absolute: 0 10*3/uL (ref 0.0–0.5)
Eosinophils Relative: 1 %
HCT: 24 % — ABNORMAL LOW (ref 36.0–46.0)
Hemoglobin: 8.2 g/dL — ABNORMAL LOW (ref 12.0–15.0)
Immature Granulocytes: 7 %
Lymphocytes Relative: 13 %
Lymphs Abs: 0.5 10*3/uL — ABNORMAL LOW (ref 0.7–4.0)
MCH: 33.9 pg (ref 26.0–34.0)
MCHC: 34.2 g/dL (ref 30.0–36.0)
MCV: 99.2 fL (ref 80.0–100.0)
Monocytes Absolute: 0.4 10*3/uL (ref 0.1–1.0)
Monocytes Relative: 13 %
Neutro Abs: 2.3 10*3/uL (ref 1.7–7.7)
Neutrophils Relative %: 65 %
Platelets: 346 10*3/uL (ref 150–400)
RBC: 2.42 MIL/uL — ABNORMAL LOW (ref 3.87–5.11)
RDW: 16.5 % — ABNORMAL HIGH (ref 11.5–15.5)
Smear Review: NORMAL
WBC: 3.5 10*3/uL — ABNORMAL LOW (ref 4.0–10.5)
nRBC: 0 % (ref 0.0–0.2)

## 2021-03-28 LAB — SAMPLE TO BLOOD BANK

## 2021-03-28 MED ORDER — SODIUM CHLORIDE 0.9 % IV SOLN
150.0000 mg | Freq: Once | INTRAVENOUS | Status: AC
  Administered 2021-03-28: 150 mg via INTRAVENOUS
  Filled 2021-03-28: qty 15

## 2021-03-28 MED ORDER — SODIUM CHLORIDE 0.9 % IV SOLN
10.0000 mg | Freq: Once | INTRAVENOUS | Status: AC
  Administered 2021-03-28: 10 mg via INTRAVENOUS
  Filled 2021-03-28: qty 10

## 2021-03-28 MED ORDER — SODIUM CHLORIDE 0.9% FLUSH
10.0000 mL | Freq: Once | INTRAVENOUS | Status: AC
  Administered 2021-03-28: 10 mL via INTRAVENOUS
  Filled 2021-03-28: qty 10

## 2021-03-28 MED ORDER — SODIUM CHLORIDE 0.9 % IV SOLN
Freq: Once | INTRAVENOUS | Status: AC
  Filled 2021-03-28: qty 250

## 2021-03-28 MED ORDER — SODIUM CHLORIDE 0.9 % IV SOLN
45.0000 mg/m2 | Freq: Once | INTRAVENOUS | Status: AC
  Administered 2021-03-28: 78 mg via INTRAVENOUS
  Filled 2021-03-28: qty 13

## 2021-03-28 MED ORDER — LEVOTHYROXINE SODIUM 88 MCG PO TABS: 88.0000 ug | ORAL_TABLET | Freq: Every day | ORAL | 0 refills | Status: DC

## 2021-03-28 MED ORDER — LORAZEPAM 2 MG/ML IJ SOLN: 0.5000 mg | INTRAMUSCULAR | Status: DC | PRN

## 2021-03-28 MED ORDER — HEPARIN SOD (PORK) LOCK FLUSH 100 UNIT/ML IV SOLN
500.0000 [IU] | Freq: Once | INTRAVENOUS | Status: DC | PRN
  Filled 2021-03-28: qty 5

## 2021-03-28 MED ORDER — PALONOSETRON HCL INJECTION 0.25 MG/5ML
0.2500 mg | Freq: Once | INTRAVENOUS | Status: AC
  Administered 2021-03-28: 0.25 mg via INTRAVENOUS
  Filled 2021-03-28: qty 5

## 2021-03-28 MED ORDER — HEPARIN SOD (PORK) LOCK FLUSH 100 UNIT/ML IV SOLN
500.0000 [IU] | Freq: Once | INTRAVENOUS | Status: AC
  Administered 2021-03-28: 500 [IU] via INTRAVENOUS
  Filled 2021-03-28: qty 5

## 2021-03-28 MED ORDER — DIPHENHYDRAMINE HCL 50 MG/ML IJ SOLN
50.0000 mg | Freq: Once | INTRAMUSCULAR | Status: AC
  Administered 2021-03-28: 50 mg via INTRAVENOUS
  Filled 2021-03-28: qty 1

## 2021-03-28 MED ORDER — FAMOTIDINE IN NACL 20-0.9 MG/50ML-% IV SOLN
20.0000 mg | Freq: Once | INTRAVENOUS | Status: AC
  Administered 2021-03-28: 20 mg via INTRAVENOUS
  Filled 2021-03-28: qty 50

## 2021-03-28 NOTE — Progress Notes (Signed)
DISCONTINUE ON PATHWAY REGIMEN - Non-Small Cell Lung     Administer weekly:     Paclitaxel      Carboplatin   **Always confirm dose/schedule in your pharmacy ordering system**  REASON: Continuation Of Treatment PRIOR TREATMENT: [id number]: Carboplatin AUC=2 + Paclitaxel 45 mg/m2 Weekly During Radiation TREATMENT RESPONSE: Stable Disease (SD)  START ON PATHWAY REGIMEN - Non-Small Cell Lung     A cycle is every 14 days:     Durvalumab   **Always confirm dose/schedule in your pharmacy ordering system**  Patient Characteristics: Preoperative or Nonsurgical Candidate (Clinical Staging), Stage III - Nonsurgical Candidate (Nonsquamous and Squamous), PS = 0, 1 Therapeutic Status: Preoperative or Nonsurgical Candidate (Clinical Staging) AJCC T Category: cTX AJCC N Category: cNX AJCC M Category: cM0 AJCC 8 Stage Grouping: IIIA ECOG Performance Status: 1 Intent of Therapy: Curative Intent, Discussed with Patient

## 2021-03-28 NOTE — Patient Instructions (Signed)
[facility] [place]  Discharge Instructions: Thank you for choosing [facility] to provide your oncology and hematology care.  If you have a lab appointment with the [facility], please go directly to the [facility] and check in at the registration area.  Wear comfortable clothing and clothing appropriate for easy access to any Portacath or PICC line.   We strive to give you quality time with your provider. You may need to reschedule your appointment if you arrive late (15 or more minutes).  Arriving late affects you and other patients whose appointments are after yours.  Also, if you miss three or more appointments without notifying the office, you may be dismissed from the clinic at the providers discretion.      For prescription refill requests, have your pharmacy contact our office and allow 72 hours for refills to be completed.    Today you received the following chemotherapy and/or immunotherapy agents TAXOL and CARBOPLATIN      To help prevent nausea and vomiting after your treatment, we encourage you to take your nausea medication as directed.  BELOW ARE SYMPTOMS THAT SHOULD BE REPORTED IMMEDIATELY: *FEVER GREATER THAN 100.4 F (38 C) OR HIGHER *CHILLS OR SWEATING *NAUSEA AND VOMITING THAT IS NOT CONTROLLED WITH YOUR NAUSEA MEDICATION *UNUSUAL SHORTNESS OF BREATH *UNUSUAL BRUISING OR BLEEDING *URINARY PROBLEMS (pain or burning when urinating, or frequent urination) *BOWEL PROBLEMS (unusual diarrhea, constipation, pain near the anus) TENDERNESS IN MOUTH AND THROAT WITH OR WITHOUT PRESENCE OF ULCERS (sore throat, sores in mouth, or a toothache) UNUSUAL RASH, SWELLING OR PAIN  UNUSUAL VAGINAL DISCHARGE OR ITCHING   Items with * indicate a potential emergency and should be followed up as soon as possible or go to the Emergency Department if any problems should occur.  Please show the CHEMOTHERAPY ALERT CARD or IMMUNOTHERAPY ALERT CARD at  check-in to the Emergency Department and triage nurse.  Should you have questions after your visit or need to cancel or reschedule your appointment, please contact [facility] [place] [facility] [place]  [phone] and follow the prompts.  Office hours are 8:00 a.m. to 4:30 p.m. Monday - Friday. Please note that voicemails left after 4:00 p.m. may not be returned until the following business day.  We are closed weekends and major holidays. You have access to a nurse at all times for urgent questions. Please call the main number to the clinic [phone] and follow the prompts.  For any non-urgent questions, you may also contact your provider using MyChart. We now offer e-Visits for anyone 5 and older to request care online for non-urgent symptoms. For details visit [email or web address].   Also download the MyChart app! Go to the app store, search "MyChart", open the app, select [facility], and log in with your MyChart username and password.  Due to Covid, a mask is required upon entering the hospital/clinic. If you do not have a mask, one will be given to you upon arrival. For doctor visits, patients may have 1 support person aged 88 or older with them. For treatment visits, patients cannot have anyone with them due to current Covid guidelines and our immunocompromised population.   Paclitaxel injection What is this medication? PACLITAXEL (PAK li TAX el) is a chemotherapy drug. It targets fast dividing cells, like cancer cells, and causes these cells to die. This medicine is used to treat ovarian cancer, breast cancer, lung cancer, Kaposi's sarcoma, and other cancers. This medicine may be used for other  purposes; ask your health care provider or pharmacist if you have questions. COMMON BRAND NAME(S): Onxol, Taxol What should I tell my care team before I take this medication? They need to know if you have any of these conditions: history of irregular heartbeat liver disease low blood  counts, like low white cell, platelet, or red cell counts lung or breathing disease, like asthma tingling of the fingers or toes, or other nerve disorder an unusual or allergic reaction to paclitaxel, alcohol, polyoxyethylated castor oil, other chemotherapy, other medicines, foods, dyes, or preservatives pregnant or trying to get pregnant breast-feeding How should I use this medication? This drug is given as an infusion into a vein. It is administered in a hospital or clinic by a specially trained health care professional. Talk to your pediatrician regarding the use of this medicine in children. Special care may be needed. Overdosage: If you think you have taken too much of this medicine contact a poison control center or emergency room at once. NOTE: This medicine is only for you. Do not share this medicine with others. What if I miss a dose? It is important not to miss your dose. Call your doctor or health care professional if you are unable to keep an appointment. What may interact with this medication? Do not take this medicine with any of the following medications: live virus vaccines This medicine may also interact with the following medications: antiviral medicines for hepatitis, HIV or AIDS certain antibiotics like erythromycin and clarithromycin certain medicines for fungal infections like ketoconazole and itraconazole certain medicines for seizures like carbamazepine, phenobarbital, phenytoin gemfibrozil nefazodone rifampin St. John's wort This list may not describe all possible interactions. Give your health care provider a list of all the medicines, herbs, non-prescription drugs, or dietary supplements you use. Also tell them if you smoke, drink alcohol, or use illegal drugs. Some items may interact with your medicine. What should I watch for while using this medication? Your condition will be monitored carefully while you are receiving this medicine. You will need important  blood work done while you are taking this medicine. This medicine can cause serious allergic reactions. To reduce your risk you will need to take other medicine(s) before treatment with this medicine. If you experience allergic reactions like skin rash, itching or hives, swelling of the face, lips, or tongue, tell your doctor or health care professional right away. In some cases, you may be given additional medicines to help with side effects. Follow all directions for their use. This drug may make you feel generally unwell. This is not uncommon, as chemotherapy can affect healthy cells as well as cancer cells. Report any side effects. Continue your course of treatment even though you feel ill unless your doctor tells you to stop. Call your doctor or health care professional for advice if you get a fever, chills or sore throat, or other symptoms of a cold or flu. Do not treat yourself. This drug decreases your body's ability to fight infections. Try to avoid being around people who are sick. This medicine may increase your risk to bruise or bleed. Call your doctor or health care professional if you notice any unusual bleeding. Be careful brushing and flossing your teeth or using a toothpick because you may get an infection or bleed more easily. If you have any dental work done, tell your dentist you are receiving this medicine. Avoid taking products that contain aspirin, acetaminophen, ibuprofen, naproxen, or ketoprofen unless instructed by your doctor. These medicines may  hide a fever. Do not become pregnant while taking this medicine. Women should inform their doctor if they wish to become pregnant or think they might be pregnant. There is a potential for serious side effects to an unborn child. Talk to your health care professional or pharmacist for more information. Do not breast-feed an infant while taking this medicine. Men are advised not to father a child while receiving this medicine. This product  may contain alcohol. Ask your pharmacist or healthcare provider if this medicine contains alcohol. Be sure to tell all healthcare providers you are taking this medicine. Certain medicines, like metronidazole and disulfiram, can cause an unpleasant reaction when taken with alcohol. The reaction includes flushing, headache, nausea, vomiting, sweating, and increased thirst. The reaction can last from 30 minutes to several hours. What side effects may I notice from receiving this medication? Side effects that you should report to your doctor or health care professional as soon as possible: allergic reactions like skin rash, itching or hives, swelling of the face, lips, or tongue breathing problems changes in vision fast, irregular heartbeat high or low blood pressure mouth sores pain, tingling, numbness in the hands or feet signs of decreased platelets or bleeding - bruising, pinpoint red spots on the skin, black, tarry stools, blood in the urine signs of decreased red blood cells - unusually weak or tired, feeling faint or lightheaded, falls signs of infection - fever or chills, cough, sore throat, pain or difficulty passing urine signs and symptoms of liver injury like dark yellow or brown urine; general ill feeling or flu-like symptoms; light-colored stools; loss of appetite; nausea; right upper belly pain; unusually weak or tired; yellowing of the eyes or skin swelling of the ankles, feet, hands unusually slow heartbeat Side effects that usually do not require medical attention (report to your doctor or health care professional if they continue or are bothersome): diarrhea hair loss loss of appetite muscle or joint pain nausea, vomiting pain, redness, or irritation at site where injected tiredness This list may not describe all possible side effects. Call your doctor for medical advice about side effects. You may report side effects to FDA at 1-800-FDA-1088. Where should I keep my  medication? This drug is given in a hospital or clinic and will not be stored at home. NOTE: This sheet is a summary. It may not cover all possible information. If you have questions about this medicine, talk to your doctor, pharmacist, or health care provider.  2022 Elsevier/Gold Standard (2020-10-05 00:00:00)  Carboplatin injection What is this medication? CARBOPLATIN (KAR boe pla tin) is a chemotherapy drug. It targets fast dividing cells, like cancer cells, and causes these cells to die. This medicine is used to treat ovarian cancer and many other cancers. This medicine may be used for other purposes; ask your health care provider or pharmacist if you have questions. COMMON BRAND NAME(S): Paraplatin What should I tell my care team before I take this medication? They need to know if you have any of these conditions: blood disorders hearing problems kidney disease recent or ongoing radiation therapy an unusual or allergic reaction to carboplatin, cisplatin, other chemotherapy, other medicines, foods, dyes, or preservatives pregnant or trying to get pregnant breast-feeding How should I use this medication? This drug is usually given as an infusion into a vein. It is administered in a hospital or clinic by a specially trained health care professional. Talk to your pediatrician regarding the use of this medicine in children. Special care may be  needed. Overdosage: If you think you have taken too much of this medicine contact a poison control center or emergency room at once. NOTE: This medicine is only for you. Do not share this medicine with others. What if I miss a dose? It is important not to miss a dose. Call your doctor or health care professional if you are unable to keep an appointment. What may interact with this medication? medicines for seizures medicines to increase blood counts like filgrastim, pegfilgrastim, sargramostim some antibiotics like amikacin, gentamicin, neomycin,  streptomycin, tobramycin vaccines Talk to your doctor or health care professional before taking any of these medicines: acetaminophen aspirin ibuprofen ketoprofen naproxen This list may not describe all possible interactions. Give your health care provider a list of all the medicines, herbs, non-prescription drugs, or dietary supplements you use. Also tell them if you smoke, drink alcohol, or use illegal drugs. Some items may interact with your medicine. What should I watch for while using this medication? Your condition will be monitored carefully while you are receiving this medicine. You will need important blood work done while you are taking this medicine. This drug may make you feel generally unwell. This is not uncommon, as chemotherapy can affect healthy cells as well as cancer cells. Report any side effects. Continue your course of treatment even though you feel ill unless your doctor tells you to stop. In some cases, you may be given additional medicines to help with side effects. Follow all directions for their use. Call your doctor or health care professional for advice if you get a fever, chills or sore throat, or other symptoms of a cold or flu. Do not treat yourself. This drug decreases your body's ability to fight infections. Try to avoid being around people who are sick. This medicine may increase your risk to bruise or bleed. Call your doctor or health care professional if you notice any unusual bleeding. Be careful brushing and flossing your teeth or using a toothpick because you may get an infection or bleed more easily. If you have any dental work done, tell your dentist you are receiving this medicine. Avoid taking products that contain aspirin, acetaminophen, ibuprofen, naproxen, or ketoprofen unless instructed by your doctor. These medicines may hide a fever. Do not become pregnant while taking this medicine. Women should inform their doctor if they wish to become pregnant or  think they might be pregnant. There is a potential for serious side effects to an unborn child. Talk to your health care professional or pharmacist for more information. Do not breast-feed an infant while taking this medicine. What side effects may I notice from receiving this medication? Side effects that you should report to your doctor or health care professional as soon as possible: allergic reactions like skin rash, itching or hives, swelling of the face, lips, or tongue signs of infection - fever or chills, cough, sore throat, pain or difficulty passing urine signs of decreased platelets or bleeding - bruising, pinpoint red spots on the skin, black, tarry stools, nosebleeds signs of decreased red blood cells - unusually weak or tired, fainting spells, lightheadedness breathing problems changes in hearing changes in vision chest pain high blood pressure low blood counts - This drug may decrease the number of white blood cells, red blood cells and platelets. You may be at increased risk for infections and bleeding. nausea and vomiting pain, swelling, redness or irritation at the injection site pain, tingling, numbness in the hands or feet problems with balance, talking, walking  trouble passing urine or change in the amount of urine Side effects that usually do not require medical attention (report to your doctor or health care professional if they continue or are bothersome): hair loss loss of appetite metallic taste in the mouth or changes in taste This list may not describe all possible side effects. Call your doctor for medical advice about side effects. You may report side effects to FDA at 1-800-FDA-1088. Where should I keep my medication? This drug is given in a hospital or clinic and will not be stored at home. NOTE: This sheet is a summary. It may not cover all possible information. If you have questions about this medicine, talk to your doctor, pharmacist, or health care  provider.  2022 Elsevier/Gold Standard (2007-06-26 00:00:00)

## 2021-03-28 NOTE — Progress Notes (Signed)
Hematology/Oncology progress note [phone] [phone]   Patient Care Team: [name], DO as PCP - General (Family Medicine) Clent Jacks, RN as Oncology Nurse Navigator Noreene Filbert, MD as Radiation Oncologist (Radiation Oncology) Telford Nab, RN as Oncology Nurse Navigator [name], MD as Consulting Physician (Oncology)  REFERRING PROVIDER: [name], DO  CHIEF COMPLAINTS/REASON FOR VISIT:  Follow up lung cancer treatments  HISTORY OF PRESENTING ILLNESS:  # Cervix cancer Patient has developed postmenopausal vaginal bleeding and discharge.  She was noted to have a friable cervix/2 cm mass of the cervix, concerning for malignancy 12/19/2018 endometrial biopsy showed scattered atypical squamous cells suspicious for malignancy.  Predominantly necrosis with associated inflammation. 01/01/2019 initial cervix biopsy showed at least high-grade squamous intraepithelial lesion.-HPV negative 01/22/2019, repeat vaginal wall biopsy and cervix 9:00 biopsy showed squamous cell carcinoma.    Staging images 12/31/2018 showed fluid distending the endometrial canal and or endometrial thickening to the level of cervix.  No evidence of pathological lymphadenopathy.  Haziness about the lower cervix.  7 mm irregular pulmonary nodule within the right upper lobe, suspicious for possible primary or metastatic malignancy.  Chronic pleural-parenchymal scarring/fibrosis at the lung apices.  Large amount of stool.  No bowel obstruction. 01/08/2019 PET scan showed marked hypermetabolism in the region of the cervix, compatible with the reported history of cervical cancer. Small bilateral pelvic sidewall lymph nodes discernible FDG accumulation, concerning for metastatic disease although neither lymph node is enlarged by CT size criteria. 7 mm nodule in the right upper lobe shows discernible FDG accumulation.  Questionable neoplasm, primary versus metastatic. Emphysema.    #Chronic hearing loss  # Lung nodule, FDG avid, questionable primary bronchogenic carcinoma versus metastatic cancer.Discussed with radiation oncology.  Her case was also discussed on tumor board.  Consensus reached on finishing concurrent chemoradiation treatments for cervix cancer first. Possible SBRT to lung nodule for presumed primary lung cancer.  # 02/20/2020 - 03/19/2020 concurrent chemotherapy weekly carboplatin and taxol and radiation for treatment of this cervix cancer.  #December 2021 lung nodule,was evaluated by Dr.Oaks. Options of biopsy via transthoracic or endobronchial approach vs surgical resection.  Patient opted to empiric SBRT  Patient was last seen by me on 06/25/2019.  Lost follow-up. She continues to follow with radiation oncology. 11/20/2020, CT chest with contrast showed minimal residual of previously seen right upper lobe nodule.  0.6 x 0.4 cm.  Internal development of mild bandlike radiation fibrosis.  Newly enlarged pretracheal lymph node, measuring 2.5 x 1.7 cm.  Highly concerning for metastatic disease.  Unchanged prominent AP window and the right hilar lymph nodes.  Emphysema and diffuse bilateral bronchial wall thickening.  Coronary artery disease. 12/13/2020, PET scan showed enlarging hypermetabolic right lower paratracheal lymph node, compatible with metastatic disease.  SUV 10.3.  Mild FDG uptake of the bilateral sacral alla with associated lucency.  Concerning for sacral insufficiency fracture.  Stable posttreatment changes of the right upper lobe nodule.  Nonspecific small solid 3 mm pulmonary nodule of the left lower lobe.  2 small to be characterized.  No evidence of FDG avid metastatic disease in the abdomen or pelvis.  Aortic atherosclerosis and emphysema.  Patient reestablish care on 12/27/2020 for lung cancer treatments   #01/12/2021 patient underwent bronchoscopy biopsy by Dr. Patsey Berthold Right paratracheal lymph node fine-needle aspiration is positive for  metastatic non-small cell carcinoma, favor adenocarcinoma of lung origin.  # 02/14/2021, concurrent carboplatin AUC 2 and taxol 36m/m2  INTERVAL HISTORY Mary LINEBACKis a  76 y.o. female who has above history reviewed by me today presents for follow up visit for management of possible recurrent lung cancer.  History of cervix cancer.   She tolerates chemotherapy well.  No nausea, vomiting, diarrhea.  + chronic fatigue, worse.  Not lightheaded.  Appetite is fair. Weight is stable.    Review of Systems  Constitutional:  Positive for fatigue. Negative for appetite change, chills and fever.  HENT:   Positive for hearing loss. Negative for voice change.   Eyes:  Negative for eye problems.  Respiratory:  Negative for chest tightness and cough.   Cardiovascular:  Negative for chest pain.  Gastrointestinal:  Negative for abdominal distention, abdominal pain and blood in stool.  Endocrine: Negative for hot flashes.  Genitourinary:  Negative for difficulty urinating and frequency.   Musculoskeletal:  Negative for arthralgias.  Skin:  Negative for itching and rash.  Neurological:  Negative for extremity weakness.  Hematological:  Negative for adenopathy.  Psychiatric/Behavioral:  Negative for confusion.    MEDICAL HISTORY:  Past Medical History:  Diagnosis Date   Arthritis    Cancer ([place])    Carotid atherosclerosis    Carotid bruit    Cervical cancer (HCC)    COPD (chronic obstructive pulmonary disease) (HCC)    emphysema   Coronary artery disease    Family history of adverse reaction to anesthesia    paternal grandfather died during surgery-pt unaware what happened   GERD (gastroesophageal reflux disease)    Hyperlipidemia    Hypertension    Hyponatremia 02/27/2019   Hypothyroidism    Infected cat bite 1980s   was hospitalized   Irregular heartbeat    Medical history non-contributory    Peripheral vascular disease ([place])    Tobacco use     SURGICAL HISTORY: Past  Surgical History:  Procedure Laterality Date   CAROTID ENDARTERECTOMY Right Oct. 2015   Dr. Lucky Cowboy   CAROTID STENOSIS Left April 2016   carotid stenosis surgery   COLONOSCOPY WITH PROPOFOL N/A 11/08/2016   Procedure: COLONOSCOPY WITH PROPOFOL;  Surgeon: Jonathon Bellows, MD;  Location[facility] ENDOSCOPY;  Service: Gastroenterology;  Laterality: N/A;   ESOPHAGOGASTRODUODENOSCOPY (EGD) WITH PROPOFOL N/A 11/08/2016   Procedure: ESOPHAGOGASTRODUODENOSCOPY (EGD) WITH PROPOFOL;  Surgeon: Jonathon Bellows, MD;  Location: [facility] ENDOSCOPY;  Service: Gastroenterology;  Laterality: N/A;   FLEXIBLE BRONCHOSCOPY N/A 12/17/2014   Procedure: FLEXIBLE BRONCHOSCOPY;  Surgeon: Wilhelmina Mcardle, MD;  Location: [facility] ORS;  Service: Pulmonary;  Laterality: N/A;   INCISION AND DRAINAGE / EXCISION THYROGLOSSAL CYST  April 2011   PORTA CATH INSERTION N/A 03/03/2021   Procedure: PORTA CATH INSERTION;  Surgeon: Algernon Huxley, MD;  Location: [facility];  Service: Cardiovascular;  Laterality: N/A;   VIDEO BRONCHOSCOPY WITH ENDOBRONCHIAL ULTRASOUND N/A 01/12/2021   Procedure: VIDEO BRONCHOSCOPY WITH ENDOBRONCHIAL ULTRASOUND;  Surgeon: Tyler Pita, MD;  Location: [facility] ORS;  Service: Cardiopulmonary;  Laterality: N/A;    SOCIAL HISTORY: Social History   Socioeconomic History   Marital status: Married    Spouse name: Deidre Ala   Number of children: Not on file   Years of education: Not on file   Highest education level: 9th grade  Occupational History   Occupation: retired   Tobacco Use   Smoking status: Every Day    Packs/day: 0.50    Years: 50.00    Pack years: 25.00    Types: Cigarettes   Smokeless tobacco: Never   Tobacco comments:    0.5 PPD 02/10/2021  Vaping  Use   Vaping Use: Former  Substance and Sexual Activity   Alcohol use: No    Alcohol/week: 0.0 standard drinks   Drug use: No   Sexual activity: Yes  Other Topics Concern   Not on file  Social History Narrative   Lives at home with husband    Social Determinants of Health   Financial Resource Strain: Low Risk    Difficulty of Paying Living Expenses: Not hard at all  Food Insecurity: No Food Insecurity   Worried About Charity fundraiser in the Last Year: Never true   Arboriculturist in the Last Year: Never true  Transportation Needs: No Transportation Needs   Lack of Transportation (Medical): No   Lack of Transportation (Non-Medical): No  Physical Activity: Inactive   Days of Exercise per Week: 0 days   Minutes of Exercise per Session: 0 min  Stress: No Stress Concern Present   Feeling of Stress : Not at all  Social Connections: Moderately Isolated   Frequency of Communication with Friends and Family: More than three times a week   Frequency of Social Gatherings with Friends and Family: More than three times a week   Attends Religious Services: Never   Marine scientist or Organizations: No   Attends Music therapist: Never   Marital Status: Married  Human resources officer Violence: Not At Risk   Fear of Current or Ex-Partner: No   Emotionally Abused: No   Physically Abused: No   Sexually Abused: No    FAMILY HISTORY: Family History  Problem Relation Age of Onset   Congestive Heart Failure Mother    Heart disease Mother    Hypertension Mother    Hypertension Sister    Diabetes Sister    Heart disease Sister    Heart disease Maternal Uncle    Heart disease Maternal Grandmother    Stroke Maternal Grandfather    Cancer Brother        liver, lung   Heart disease Brother    Hypertension Brother    Heart attack Brother    COPD Neg Hx    Breast cancer Neg Hx     ALLERGIES:  is allergic to atorvastatin.  MEDICATIONS:  Current Outpatient Medications  Medication Sig Dispense Refill   acetaminophen (TYLENOL) 500 MG tablet Take 500 mg by mouth every 6 (six) hours as needed for moderate pain.     alendronate (FOSAMAX) 70 MG tablet Take 1 tablet (70 mg total) by mouth every 7 (seven) days. Take  with a full glass of water on an empty stomach. 12 tablet 3   chlorhexidine (PERIDEX) 0.12 % solution Use as directed 5 mLs in the mouth or throat 2 (two) times daily. 120 mL 0   clopidogrel (PLAVIX) 75 MG tablet Take 1 tablet (75 mg total) by mouth daily. 90 tablet 3   cyclobenzaprine (FLEXERIL) 10 MG tablet TAKE 1 TABLET AT BEDTIME 60 tablet 3   dexamethasone (DECADRON) 4 MG tablet Take 2 tablets (8 mg total) by mouth daily. Take for 2 days after chemotherapy. . 30 tablet 1   ferrous sulfate 325 (65 FE) MG EC tablet Take 1 tablet (325 mg total) by mouth daily with breakfast. 90 tablet 4   Fluticasone-Umeclidin-Vilant (TRELEGY ELLIPTA) 100-62.5-25 MCG/INH AEPB Inhale 1 puff into the lungs daily. 1 each 11   gabapentin (NEURONTIN) 100 MG capsule Take 1 capsule in the morning and afternoon, and 3 capsules at bedtime 450 capsule 1  levothyroxine (SYNTHROID) 75 MCG tablet Take 1 tablet (75 mcg total) by mouth daily. 90 tablet 3   lidocaine (LIDODERM) 5 % Place 1 patch onto the skin daily. Remove & Discard patch within 12 hours or as directed by MD 90 patch 1   lidocaine-prilocaine (EMLA) cream Apply 1 application topically as needed. 60 g 3   lisinopril (ZESTRIL) 20 MG tablet Take 1 tablet (20 mg total) by mouth daily. (Patient taking differently: Take 20 mg by mouth.) 90 tablet 1   montelukast (SINGULAIR) 10 MG tablet Take 1 tablet (10 mg total) by mouth See admin instructions. Take 61m on the day before chemotherapy 30 tablet 0   Multiple Vitamin (MULTIVITAMIN) tablet Take 1 tablet by mouth daily.     naproxen (NAPROSYN) 500 MG tablet TAKE 1 TABLET TWICE DAILY WITH MEALS 180 tablet 1   omeprazole (PRILOSEC) 20 MG capsule Take 1 capsule (20 mg total) by mouth daily. 90 capsule 1   ondansetron (ZOFRAN) 8 MG tablet Take 1 tablet (8 mg total) by mouth 2 (two) times daily as needed for refractory nausea / vomiting. Start on day 3 after chemo. 30 tablet 1   vitamin B-12 (CYANOCOBALAMIN) 500 MCG tablet  Take 500 mcg by mouth daily.     No current facility-administered medications for this visit.   Facility-Administered Medications Ordered in Other Visits  Medication Dose Route Frequency Provider Last Rate Last Admin   CARBOplatin (PARAPLATIN) 150 mg in sodium chloride 0.9 % 100 mL chemo infusion  150 mg Intravenous Once YEarlie Server MD       heparin lock flush 100 unit/mL  500 Units Intravenous Once YEarlie Server MD       heparin lock flush 100 unit/mL  500 Units Intracatheter Once PRN YEarlie Server MD       LORazepam (ATIVAN) injection 0.5 mg  0.5 mg Intravenous PRN YEarlie Server MD       PACLitaxel (TAXOL) 78 mg in sodium chloride 0.9 % 250 mL chemo infusion (</= 845mm2)  45 mg/m2 (Order-Specific) Intravenous Once YuEarlie ServerMD         PHYSICAL EXAMINATION: ECOG PERFORMANCE STATUS: 1 - Symptomatic but completely ambulatory Vitals:   03/28/21 0853  BP: (!) 149/73  Pulse: 91  Temp: (!) 97 F (36.1 C)   Filed Weights   03/28/21 0853  Weight: 143 lb (64.9 kg)    Physical Exam Constitutional:      General: She is not in acute distress. HENT:     Head: Normocephalic and atraumatic.  Eyes:     General: No scleral icterus.    Pupils: Pupils are equal, round, and reactive to light.  Cardiovascular:     Rate and Rhythm: Normal rate and regular rhythm.     Heart sounds: Normal heart sounds.  Pulmonary:     Effort: Pulmonary effort is normal. No respiratory distress.     Breath sounds: No wheezing.     Comments: Decreased breath sound bilaterally  Abdominal:     General: Bowel sounds are normal. There is no distension.     Palpations: Abdomen is soft. There is no mass.     Tenderness: There is no abdominal tenderness.  Musculoskeletal:        General: No deformity. Normal range of motion.     Cervical back: Normal range of motion and neck supple.  Skin:    General: Skin is warm and dry.     Findings: No erythema or rash.  Neurological:  Mental Status: She is alert and oriented to  person, place, and time. Mental status is at baseline.     Cranial Nerves: No cranial nerve deficit.     Coordination: Coordination normal.  Psychiatric:        Mood and Affect: Mood normal.    LABORATORY DATA:  I have reviewed the data as listed Lab Results  Component Value Date   WBC 3.5 (L) 03/28/2021   HGB 8.2 (L) 03/28/2021   HCT 24.0 (L) 03/28/2021   MCV 99.2 03/28/2021   PLT 346 03/28/2021   Recent Labs    03/14/21 0849 03/21/21 0826 03/25/21 1134 03/28/21 0831  NA 129* 130* 134 131*  K 4.7 4.5 5.0 4.5  CL 100 106 102 98  CO2 20* 20* 20 25  GLUCOSE 123* 101* 86 105*  BUN _0 25*  CREATININE 1.04* 0.93 0.98 1.03*  CALCIUM 8.7* 7.9* 8.7 9.3  GFRNONAA 57* >60  --  57*  PROT 6.4* 6.2* 6.5 6.2*  ALBUMIN 3.2* 3.3* 3.9 3.2*  AST _1 ALT _2 ALKPHOS 66 68 93 65  BILITOT 0.2* 0.1* 0.3 0.1*    Iron/TIBC/Ferritin/ %Sat    Component Value Date/Time   IRON 55 10/31/2016 1546   TIBC 339 10/31/2016 1546   FERRITIN 18 10/31/2016 1546   IRONPCTSAT 16 10/31/2016 1546      RADIOGRAPHIC STUDIES: I have personally reviewed the radiological images as listed and agreed with the findings in the report. MR Brain W Wo Contrast  Result Date: 02/01/2021 CLINICAL DATA:  Malignant neoplasm of overlapping sites of cervix ([place]) C53.8 (ICD-10-CM). Lung cancer follow up. EXAM: MRI HEAD WITHOUT AND WITH CONTRAST TECHNIQUE: Multiplanar, multiecho pulse sequences of the brain and surrounding structures were obtained without and with intravenous contrast. CONTRAST:  56m GADAVIST GADOBUTROL 1 MMOL/ML IV SOLN COMPARISON:  MRI of the brain August 18, 2004. FINDINGS: Brain: No acute infarction, hemorrhage, hydrocephalus, extra-axial collection or mass lesion. Scattered foci of T2 hyperintensity are seen within the white matter of cerebral hemispheres, most likely small vessel ischemia, mildly progressed since prior MRI. A few punctate foci of susceptibility artifact are seen  scattered in the right cerebral hemisphere, may represent hemosiderin deposits. No focus of abnormal contrast enhancement to suggest intracranial metastatic disease. Vascular: Normal flow voids. Skull and upper cervical spine: Normal marrow signal. Sinuses/Orbits: Negative. Other: Trace mucosal thickening in the bilateral mastoid cells. Epidermal inclusion cyst measuring 1.4 cm posteriorly in the neck subcutaneous. IMPRESSION: 1. No evidence of intracranial metastatic disease. 2. Mild-to-moderate chronic microvascular ischemic changes of the white. Electronically Signed   By: KPedro EarlsM.D.   On: 02/01/2021 13:53   MR Lumbar Spine Wo Contrast  Result Date: 01/21/2021 CLINICAL DATA:  Low back pain, increased fracture risk ?sacral insuffiency fracture on PET scan EXAM: MRI LUMBAR SPINE WITHOUT CONTRAST TECHNIQUE: Multiplanar, multisequence MR imaging of the lumbar spine was performed. No intravenous contrast was administered. COMPARISON:  PET-CT 12/13/2020, x-ray 05/20/2020 FINDINGS: Segmentation: Transitional lumbosacral anatomy with partial sacralization of the L5 segment. Lowest rib-bearing segment is labeled as T12 (axial series 8, image 6). Alignment:  Trace retrolisthesis of L4 on L5. Vertebrae: Mild superior endplate compression fracture of L5 with approximately 15% vertebral body height loss. Mild bone marrow edema within the L5 vertebral body. There is extensive bone marrow edema within the bilateral sacral ala associated with bilateral sacral fractures, likely subacute given findings on previous PET-CT. Remaining vertebral  bodies of the lumbar spine are intact with preserved height. No additional fracture. No evidence of discitis. No suspicious marrow replacing bone lesion. Conus medullaris and cauda equina: Conus extends to the L1 level. Conus and cauda equina appear normal. Paraspinal and other soft tissues: Negative. Disc levels: T12-L1: Minimal disc bulge. Mild bilateral facet  arthropathy. No foraminal or canal stenosis. L1-L2: Minimal disc bulge with small left foraminal protrusion. Mild bilateral facet hypertrophy. No foraminal or canal stenosis. L2-L3: Mild annular disc bulge. Mild bilateral facet arthropathy with ligamentum flavum buckling. Findings result in mild canal stenosis. No significant foraminal stenosis. L3-L4: Mild annular disc bulge. Mild bilateral facet arthropathy with ligamentum flavum buckling. Findings result in mild canal stenosis. No significant foraminal stenosis. L4-L5: Retrolisthesis with disc bulge and endplate ridging. Mild-moderate bilateral facet arthropathy with ligamentum flavum buckling. Mild bilateral subarticular recess stenosis without canal stenosis. No significant foraminal stenosis. L5-S1: Transitional level.  No evidence of impingement. IMPRESSION: 1. Transitional lumbosacral anatomy, as above. 2. Subacute-appearing mild superior endplate compression fracture of L5 with approximately 15% vertebral body height loss. 3. Extensive bone marrow edema within the bilateral sacral ala associated with bilateral sacral fractures, likely subacute given findings on previous PET-CT. 4. Mild multilevel degenerative changes of the lumbar spine as described. 5. Mild canal stenosis at L2-3 and L3-4. 6. Mild bilateral subarticular recess stenosis at L4-5. Electronically Signed   By: Davina Poke D.O.   On: 01/21/2021 09:10   PERIPHERAL VASCULAR CATHETERIZATION  Result Date: 03/03/2021 See surgical note for result.  DG Bone Density  Result Date: 01/25/2021 EXAM: DUAL X-RAY ABSORPTIOMETRY (DXA) FOR BONE MINERAL DENSITY IMPRESSION: crr Your patient Zunairah Devers completed a BMD test on 01/25/2021 using the Smiley (analysis version: 14.10) manufactured by [employer]. The following summarizes the results of our evaluation. PATIENT BIOGRAPHICAL: Name: Noelene, Gang Patient ID: [id number] Birth Date: Apr 22, 1945 Height: 67.2 in.  Gender: Female Exam Date: 01/25/2021 Weight: 132.2 lbs. Indications: Advanced Age, arthritis, Caucasian, copd, emphysema, Height Loss, History of Fracture (Adult), hx cervical ca, hypothyroid, Low Body Weight, lung ca, POSTmenopausal, Tobacco User Fractures: coccyx Treatments: GABAPENTIN, levothyroxine, multivitamin, plavix, trelegy ASSESSMENT: The BMD measured at Femur Total Right is 0.522 g/cm2 with a T-score of -3.9. This patient is considered osteoporotic according to [facility]) criteria. Lumbar spine was not utilized due to advanced degenerative changes.The scan quality is limited by exclusion of L-spine. Site Region Measured Measured WHO Young Adult BMD Date       Age      Classification T-score DualFemur Total Right 01/25/2023 73.10 Osteoporosis -3.9 0.522 g/cm2 Left Forearm Radius 33% 01/25/2021 73.8 Osteoporosis -3.7 0.556 g/cm2 World Health Organization [facility]) criteria for post-menopausal, Caucasian Women: Normal:       T-score at or above -1 SD Osteopenia:   T-score between -1 and -2.5 SD Osteoporosis: T-score at or below -2.5 SD RECOMMENDATIONS: 1. All patients should optimize calcium and vitamin D intake. 2. Consider FDA-approved medical therapies in postmenopausal women and men aged 53 years and older, based on the following: a. A hip or vertebral (clinical or morphometric) fracture b. T-score < -2.5 at the femoral neck or spine after appropriate evaluation to exclude secondary causes c. Low bone mass (T-score between -1.0 and -2.5 at the femoral neck or spine) and a 10-year probability of a hip fracture > 3% or a 10-year probability of a major osteoporosis-related fracture > 20% based on the [place]-adapted WHO algorithm d. Clinician judgment and/or patient preferences  may indicate treatment for people with 10-year fracture probabilities above or below these levels FOLLOW-UP: People with diagnosed cases of osteoporosis or at high risk for fracture should have regular bone mineral density  tests. For patients eligible for Medicare, routine testing is allowed once every 2 years. The testing frequency can be increased to one year for patients who have rapidly progressing disease, those who are receiving or discontinuing medical therapy to restore bone mass, or have additional risk factors. I have reviewed this report, and agree with the above findings. [facility] Electronically Signed   By: Franki Cabot M.D.   On: 01/25/2021 15:11   MM 3D SCREEN BREAST BILATERAL  Result Date: 01/26/2021 CLINICAL DATA:  Screening. EXAM: DIGITAL SCREENING BILATERAL MAMMOGRAM WITH TOMOSYNTHESIS AND CAD TECHNIQUE: Bilateral screening digital craniocaudal and mediolateral oblique mammograms were obtained. Bilateral screening digital breast tomosynthesis was performed. The images were evaluated with computer-aided detection. COMPARISON:  None. ACR Breast Density Category c: The breast tissue is heterogeneously dense, which may obscure small masses. FINDINGS: In the right breast, calcifications warrant further evaluation with magnified views. In the left breast, no findings suspicious for malignancy. IMPRESSION: Further evaluation is suggested for calcifications in the right breast. RECOMMENDATION: Diagnostic mammogram of the right breast. (Code:[id number]) The patient will be contacted regarding the findings, and additional imaging will be scheduled. BI-RADS CATEGORY  0: Incomplete. Need additional imaging evaluation and/or prior mammograms for comparison. Electronically Signed   By: Nolon Nations M.D.   On: 01/26/2021 07:35      ASSESSMENT & PLAN:  1. Primary adenocarcinoma of right lung ([place])   2. Encounter for antineoplastic chemotherapy   3. Malignant neoplasm of cervix, unspecified site ([place])   4. Anemia due to antineoplastic chemotherapy   5. Hypocalcemia    Cancer Staging  Cervical cancer ([place]) Staging form: Cervix Uteri, AJCC Version 9 - Clinical: FIGO Stage IIICr (cTX, cN1) -  Unsigned  Primary lung adenocarcinoma ([place]) Staging form: Lung, AJCC 7th Edition - Clinical stage from 01/12/2021: T1, N1, M0 - Signed by [name], MD on 01/29/2021   #Stage III lung adenocarcinoma, recurrent  On concurrent chemotherapy and radiation. Labs are reviewed and discussed with patient. Proceed with carboplatin/Taxol today.  Last RT on 03/31/21  # Chemo/RT induced anemia. Hb 8.2, ok to proceed treatment, anemia may get worse after this treatment. She will repeat CBC next week, see NP for possible blood transfusion.  Marland Kitchen  #Hypertension, Continue current BP regimen.   # Locally advanced cervical cancer stage IIIC1 with pelvic nodes involvement. S/p concurrent chemotherapy and radiation followed by brachytherapy.  She is not actively following up with gynecology oncology.  I recommend patient to reestablish care.  # Hypocalcemia, recently started on Fosamax. Recommend calcium supplementation 1214m daily.- otc supply.  Potassium has improved.   All questions were answered. The patient knows to call the clinic with any problems questions or concerns.   Return of visit:  1 week lab NP +/- blood transfusion.  4 weeks CT chest w contrast.  4-5 weeks lab MD Durvalumab.    ZEarlie Server MD, PhD 03/28/2021

## 2021-03-29 ENCOUNTER — Ambulatory Visit
Admission: RE | Admit: 2021-03-29 | Discharge: 2021-03-29 | Disposition: A | Discharge status: Home / Self Care | Payer: Medicare PPO | Attending: Family Medicine | Admitting: Family Medicine

## 2021-03-29 ENCOUNTER — Ambulatory Visit: Payer: Medicare PPO

## 2021-03-29 ENCOUNTER — Telehealth: Payer: Self-pay | Admitting: Family Medicine

## 2021-03-29 ENCOUNTER — Ambulatory Visit
Admission: RE | Admit: 2021-03-29 | Discharge: 2021-03-29 | Disposition: A | Discharge status: Home / Self Care | Payer: Medicare PPO | Source: Ambulatory Visit | Attending: Family Medicine | Admitting: Family Medicine

## 2021-03-29 ENCOUNTER — Ambulatory Visit
Admission: RE | Admit: 2021-03-29 | Discharge: 2021-03-29 | Disposition: A | Discharge status: Home / Self Care | Payer: Medicare PPO | Source: Ambulatory Visit | Attending: Radiation Oncology | Admitting: Radiation Oncology

## 2021-03-29 DIAGNOSIS — T451X5A Adverse effect of antineoplastic and immunosuppressive drugs, initial encounter: Secondary | ICD-10-CM | POA: Diagnosis not present

## 2021-03-29 DIAGNOSIS — Z510 Encounter for antineoplastic radiation therapy: Secondary | ICD-10-CM | POA: Diagnosis not present

## 2021-03-29 DIAGNOSIS — Z79899 Other long term (current) drug therapy: Secondary | ICD-10-CM | POA: Diagnosis not present

## 2021-03-29 DIAGNOSIS — R1012 Left upper quadrant pain: Secondary | ICD-10-CM

## 2021-03-29 DIAGNOSIS — M546 Pain in thoracic spine: Secondary | ICD-10-CM | POA: Diagnosis not present

## 2021-03-29 DIAGNOSIS — C770 Secondary and unspecified malignant neoplasm of lymph nodes of head, face and neck: Secondary | ICD-10-CM | POA: Diagnosis not present

## 2021-03-29 DIAGNOSIS — D6481 Anemia due to antineoplastic chemotherapy: Secondary | ICD-10-CM | POA: Diagnosis not present

## 2021-03-29 DIAGNOSIS — M549 Dorsalgia, unspecified: Secondary | ICD-10-CM | POA: Diagnosis not present

## 2021-03-29 DIAGNOSIS — C3432 Malignant neoplasm of lower lobe, left bronchus or lung: Secondary | ICD-10-CM | POA: Diagnosis not present

## 2021-03-29 DIAGNOSIS — C3491 Malignant neoplasm of unspecified part of right bronchus or lung: Secondary | ICD-10-CM | POA: Diagnosis not present

## 2021-03-29 DIAGNOSIS — R079 Chest pain, unspecified: Secondary | ICD-10-CM | POA: Diagnosis not present

## 2021-03-29 DIAGNOSIS — Z5111 Encounter for antineoplastic chemotherapy: Secondary | ICD-10-CM | POA: Diagnosis not present

## 2021-03-29 DIAGNOSIS — C539 Malignant neoplasm of cervix uteri, unspecified: Secondary | ICD-10-CM | POA: Diagnosis not present

## 2021-03-29 DIAGNOSIS — I10 Essential (primary) hypertension: Secondary | ICD-10-CM | POA: Diagnosis not present

## 2021-03-29 NOTE — Telephone Encounter (Addendum)
As per pharmacist PA is needed for the 5% lidocaine or PCP can recommend the 4% which is OTC.   The patient was inquiring about her gabapentin (NEURONTIN) 100 MG capsule and as per the pharmacist its to early for patient to pick up mail order confirms it was delivered on 2/14 therefore the next delivery will not be until 3/14. Patient states PCP increase the 100 MG therefore pharmacy needs new script. Patient seeking clarity and pharmacy seeking clarity.    [facility] [place] - [address] Phone:  [phone]  Fax:  [phone]

## 2021-03-30 ENCOUNTER — Ambulatory Visit: Payer: Medicare PPO

## 2021-03-30 ENCOUNTER — Ambulatory Visit
Admission: RE | Admit: 2021-03-30 | Discharge: 2021-03-30 | Disposition: A | Discharge status: Home / Self Care | Payer: Medicare PPO | Source: Ambulatory Visit | Attending: Radiation Oncology | Admitting: Radiation Oncology

## 2021-03-30 DIAGNOSIS — Z923 Personal history of irradiation: Secondary | ICD-10-CM | POA: Diagnosis not present

## 2021-03-30 DIAGNOSIS — Z510 Encounter for antineoplastic radiation therapy: Secondary | ICD-10-CM | POA: Diagnosis not present

## 2021-03-30 DIAGNOSIS — I10 Essential (primary) hypertension: Secondary | ICD-10-CM | POA: Insufficient documentation

## 2021-03-30 DIAGNOSIS — C3491 Malignant neoplasm of unspecified part of right bronchus or lung: Secondary | ICD-10-CM | POA: Insufficient documentation

## 2021-03-30 DIAGNOSIS — Z79899 Other long term (current) drug therapy: Secondary | ICD-10-CM | POA: Diagnosis not present

## 2021-03-30 DIAGNOSIS — Z5111 Encounter for antineoplastic chemotherapy: Secondary | ICD-10-CM | POA: Insufficient documentation

## 2021-03-30 DIAGNOSIS — C770 Secondary and unspecified malignant neoplasm of lymph nodes of head, face and neck: Secondary | ICD-10-CM | POA: Diagnosis not present

## 2021-03-30 NOTE — Telephone Encounter (Signed)
Spoke with patient and let her know that she should reach out to her mail order pharmacy and let them know that she has a new prescription for her gabapentin and that she will now be taking 5 caps a day instead of 3 caps daily.  Patient verbalized understanding.   ?Will start a PA for lidocaine patches.  ?

## 2021-03-31 ENCOUNTER — Ambulatory Visit
Admission: RE | Admit: 2021-03-31 | Discharge: 2021-03-31 | Disposition: A | Discharge status: Home / Self Care | Payer: Medicare PPO | Source: Ambulatory Visit | Attending: Radiation Oncology | Admitting: Radiation Oncology

## 2021-03-31 DIAGNOSIS — Z79899 Other long term (current) drug therapy: Secondary | ICD-10-CM | POA: Diagnosis not present

## 2021-03-31 DIAGNOSIS — Z510 Encounter for antineoplastic radiation therapy: Secondary | ICD-10-CM | POA: Diagnosis not present

## 2021-03-31 DIAGNOSIS — Z923 Personal history of irradiation: Secondary | ICD-10-CM | POA: Diagnosis not present

## 2021-03-31 DIAGNOSIS — I10 Essential (primary) hypertension: Secondary | ICD-10-CM | POA: Diagnosis not present

## 2021-03-31 DIAGNOSIS — C770 Secondary and unspecified malignant neoplasm of lymph nodes of head, face and neck: Secondary | ICD-10-CM | POA: Diagnosis not present

## 2021-03-31 DIAGNOSIS — C3491 Malignant neoplasm of unspecified part of right bronchus or lung: Secondary | ICD-10-CM | POA: Diagnosis not present

## 2021-03-31 DIAGNOSIS — Z5111 Encounter for antineoplastic chemotherapy: Secondary | ICD-10-CM | POA: Diagnosis not present

## 2021-04-04 ENCOUNTER — Inpatient Hospital Stay (HOSPITAL_BASED_OUTPATIENT_CLINIC_OR_DEPARTMENT_OTHER): Payer: Medicare PPO | Admitting: Nurse Practitioner

## 2021-04-04 ENCOUNTER — Other Ambulatory Visit: Payer: Self-pay

## 2021-04-04 ENCOUNTER — Inpatient Hospital Stay: Payer: Medicare PPO

## 2021-04-04 ENCOUNTER — Inpatient Hospital Stay: Payer: Medicare PPO | Attending: Oncology

## 2021-04-04 ENCOUNTER — Encounter: Payer: Self-pay | Admitting: Nurse Practitioner

## 2021-04-04 VITALS — BP 150/56 | Temp 94.0°F | Wt 138.6 lb

## 2021-04-04 DIAGNOSIS — F17210 Nicotine dependence, cigarettes, uncomplicated: Secondary | ICD-10-CM | POA: Insufficient documentation

## 2021-04-04 DIAGNOSIS — R5383 Other fatigue: Secondary | ICD-10-CM | POA: Diagnosis not present

## 2021-04-04 DIAGNOSIS — D649 Anemia, unspecified: Secondary | ICD-10-CM | POA: Diagnosis not present

## 2021-04-04 DIAGNOSIS — H9190 Unspecified hearing loss, unspecified ear: Secondary | ICD-10-CM | POA: Insufficient documentation

## 2021-04-04 DIAGNOSIS — C3491 Malignant neoplasm of unspecified part of right bronchus or lung: Secondary | ICD-10-CM | POA: Diagnosis not present

## 2021-04-04 DIAGNOSIS — I739 Peripheral vascular disease, unspecified: Secondary | ICD-10-CM | POA: Diagnosis not present

## 2021-04-04 DIAGNOSIS — J439 Emphysema, unspecified: Secondary | ICD-10-CM | POA: Diagnosis not present

## 2021-04-04 DIAGNOSIS — M47816 Spondylosis without myelopathy or radiculopathy, lumbar region: Secondary | ICD-10-CM | POA: Diagnosis not present

## 2021-04-04 DIAGNOSIS — M546 Pain in thoracic spine: Secondary | ICD-10-CM | POA: Insufficient documentation

## 2021-04-04 DIAGNOSIS — M4856XA Collapsed vertebra, not elsewhere classified, lumbar region, initial encounter for fracture: Secondary | ICD-10-CM | POA: Diagnosis not present

## 2021-04-04 DIAGNOSIS — Z923 Personal history of irradiation: Secondary | ICD-10-CM | POA: Insufficient documentation

## 2021-04-04 DIAGNOSIS — C539 Malignant neoplasm of cervix uteri, unspecified: Secondary | ICD-10-CM | POA: Diagnosis not present

## 2021-04-04 DIAGNOSIS — N950 Postmenopausal bleeding: Secondary | ICD-10-CM | POA: Diagnosis not present

## 2021-04-04 DIAGNOSIS — E039 Hypothyroidism, unspecified: Secondary | ICD-10-CM | POA: Insufficient documentation

## 2021-04-04 DIAGNOSIS — I700 Atherosclerosis of aorta: Secondary | ICD-10-CM | POA: Diagnosis not present

## 2021-04-04 DIAGNOSIS — Z833 Family history of diabetes mellitus: Secondary | ICD-10-CM | POA: Insufficient documentation

## 2021-04-04 DIAGNOSIS — E785 Hyperlipidemia, unspecified: Secondary | ICD-10-CM | POA: Diagnosis not present

## 2021-04-04 DIAGNOSIS — T451X5A Adverse effect of antineoplastic and immunosuppressive drugs, initial encounter: Secondary | ICD-10-CM

## 2021-04-04 DIAGNOSIS — L720 Epidermal cyst: Secondary | ICD-10-CM | POA: Diagnosis not present

## 2021-04-04 DIAGNOSIS — M5136 Other intervertebral disc degeneration, lumbar region: Secondary | ICD-10-CM | POA: Diagnosis not present

## 2021-04-04 DIAGNOSIS — M48061 Spinal stenosis, lumbar region without neurogenic claudication: Secondary | ICD-10-CM | POA: Insufficient documentation

## 2021-04-04 DIAGNOSIS — M47814 Spondylosis without myelopathy or radiculopathy, thoracic region: Secondary | ICD-10-CM | POA: Diagnosis not present

## 2021-04-04 DIAGNOSIS — Z800 Family history of malignant neoplasm of digestive organs: Secondary | ICD-10-CM | POA: Insufficient documentation

## 2021-04-04 DIAGNOSIS — Z8249 Family history of ischemic heart disease and other diseases of the circulatory system: Secondary | ICD-10-CM | POA: Insufficient documentation

## 2021-04-04 DIAGNOSIS — R609 Edema, unspecified: Secondary | ICD-10-CM | POA: Diagnosis not present

## 2021-04-04 DIAGNOSIS — Z823 Family history of stroke: Secondary | ICD-10-CM | POA: Insufficient documentation

## 2021-04-04 DIAGNOSIS — I10 Essential (primary) hypertension: Secondary | ICD-10-CM | POA: Insufficient documentation

## 2021-04-04 DIAGNOSIS — M4316 Spondylolisthesis, lumbar region: Secondary | ICD-10-CM | POA: Insufficient documentation

## 2021-04-04 DIAGNOSIS — I6782 Cerebral ischemia: Secondary | ICD-10-CM | POA: Diagnosis not present

## 2021-04-04 DIAGNOSIS — Z801 Family history of malignant neoplasm of trachea, bronchus and lung: Secondary | ICD-10-CM | POA: Insufficient documentation

## 2021-04-04 DIAGNOSIS — Z79899 Other long term (current) drug therapy: Secondary | ICD-10-CM | POA: Insufficient documentation

## 2021-04-04 DIAGNOSIS — Z9221 Personal history of antineoplastic chemotherapy: Secondary | ICD-10-CM | POA: Insufficient documentation

## 2021-04-04 DIAGNOSIS — D6481 Anemia due to antineoplastic chemotherapy: Secondary | ICD-10-CM

## 2021-04-04 LAB — CBC WITH DIFFERENTIAL/PLATELET
Abs Immature Granulocytes: 0.28 10*3/uL — ABNORMAL HIGH (ref 0.00–0.07)
Basophils Absolute: 0 10*3/uL (ref 0.0–0.1)
Basophils Relative: 1 %
Eosinophils Absolute: 0.1 10*3/uL (ref 0.0–0.5)
Eosinophils Relative: 2 %
HCT: 24.5 % — ABNORMAL LOW (ref 36.0–46.0)
Hemoglobin: 8.2 g/dL — ABNORMAL LOW (ref 12.0–15.0)
Immature Granulocytes: 6 %
Lymphocytes Relative: 9 %
Lymphs Abs: 0.4 10*3/uL — ABNORMAL LOW (ref 0.7–4.0)
MCH: 33.9 pg (ref 26.0–34.0)
MCHC: 33.5 g/dL (ref 30.0–36.0)
MCV: 101.2 fL — ABNORMAL HIGH (ref 80.0–100.0)
Monocytes Absolute: 0.3 10*3/uL (ref 0.1–1.0)
Monocytes Relative: 6 %
Neutro Abs: 3.5 10*3/uL (ref 1.7–7.7)
Neutrophils Relative %: 76 %
Platelets: 270 10*3/uL (ref 150–400)
RBC: 2.42 MIL/uL — ABNORMAL LOW (ref 3.87–5.11)
RDW: 17.5 % — ABNORMAL HIGH (ref 11.5–15.5)
Smear Review: NORMAL
WBC: 4.6 10*3/uL (ref 4.0–10.5)
nRBC: 0 % (ref 0.0–0.2)

## 2021-04-04 LAB — COMPREHENSIVE METABOLIC PANEL
ALT: 13 U/L (ref 0–44)
AST: 19 U/L (ref 15–41)
Albumin: 3.2 g/dL — ABNORMAL LOW (ref 3.5–5.0)
Alkaline Phosphatase: 59 U/L (ref 38–126)
Anion gap: 8 (ref 5–15)
BUN: 19 mg/dL (ref 8–23)
CO2: 19 mmol/L — ABNORMAL LOW (ref 22–32)
Calcium: 8.1 mg/dL — ABNORMAL LOW (ref 8.9–10.3)
Chloride: 102 mmol/L (ref 98–111)
Creatinine, Ser: 0.96 mg/dL (ref 0.44–1.00)
GFR, Estimated: >60 mL/min (ref >60)
Glucose, Bld: 113 mg/dL — ABNORMAL HIGH (ref 70–99)
Potassium: 4.3 mmol/L (ref 3.5–5.1)
Sodium: 129 mmol/L — ABNORMAL LOW (ref 135–145)
Total Bilirubin: 0.3 mg/dL (ref 0.3–1.2)
Total Protein: 5.9 g/dL — ABNORMAL LOW (ref 6.5–8.1)

## 2021-04-04 LAB — SAMPLE TO BLOOD BANK

## 2021-04-04 MED ORDER — HEPARIN SOD (PORK) LOCK FLUSH 100 UNIT/ML IV SOLN
500.0000 [IU] | Freq: Once | INTRAVENOUS | Status: AC
  Administered 2021-04-04: 500 [IU] via INTRAVENOUS
  Filled 2021-04-04: qty 5

## 2021-04-04 NOTE — Progress Notes (Signed)
Hematology/Oncology progress note [phone] [phone]   Patient Care Team: [name], DO as PCP - General (Family Medicine) Clent Jacks, RN as Oncology Nurse Navigator Noreene Filbert, MD as Radiation Oncologist (Radiation Oncology) Telford Nab, RN as Oncology Nurse Navigator [name], MD as Consulting Physician (Oncology)  REFERRING PROVIDER: [name], DO   CHIEF COMPLAINTS/REASON FOR VISIT:  Follow up lung cancer treatments  HISTORY OF PRESENTING ILLNESS:  # Cervix cancer Patient has developed postmenopausal vaginal bleeding and discharge.  She was noted to have a friable cervix/2 cm mass of the cervix, concerning for malignancy 12/19/2018 endometrial biopsy showed scattered atypical squamous cells suspicious for malignancy.  Predominantly necrosis with associated inflammation. 01/01/2019 initial cervix biopsy showed at least high-grade squamous intraepithelial lesion.-HPV negative 01/22/2019, repeat vaginal wall biopsy and cervix 9:00 biopsy showed squamous cell carcinoma.    Staging images 12/31/2018 showed fluid distending the endometrial canal and or endometrial thickening to the level of cervix.  No evidence of pathological lymphadenopathy.  Haziness about the lower cervix.  7 mm irregular pulmonary nodule within the right upper lobe, suspicious for possible primary or metastatic malignancy.  Chronic pleural-parenchymal scarring/fibrosis at the lung apices.  Large amount of stool.  No bowel obstruction. 01/08/2019 PET scan showed marked hypermetabolism in the region of the cervix, compatible with the reported history of cervical cancer. Small bilateral pelvic sidewall lymph nodes discernible FDG accumulation, concerning for metastatic disease although neither lymph node is enlarged by CT size criteria. 7 mm nodule in the right upper lobe shows discernible FDG accumulation.  Questionable neoplasm, primary versus metastatic. Emphysema.    #Chronic hearing loss  # Lung nodule, FDG avid, questionable primary bronchogenic carcinoma versus metastatic cancer.Discussed with radiation oncology.  Her case was also discussed on tumor board.  Consensus reached on finishing concurrent chemoradiation treatments for cervix cancer first. Possible SBRT to lung nodule for presumed primary lung cancer.  # 02/20/2020 - 03/19/2020 concurrent chemotherapy weekly carboplatin and taxol and radiation for treatment of this cervix cancer.  #December 2021 lung nodule,was evaluated by Dr.Oaks. Options of biopsy via transthoracic or endobronchial approach vs surgical resection.  Patient opted to empiric SBRT  Patient was last seen by me on 06/25/2019.  Lost follow-up. She continues to follow with radiation oncology. 11/20/2020, CT chest with contrast showed minimal residual of previously seen right upper lobe nodule.  0.6 x 0.4 cm.  Internal development of mild bandlike radiation fibrosis.  Newly enlarged pretracheal lymph node, measuring 2.5 x 1.7 cm.  Highly concerning for metastatic disease.  Unchanged prominent AP window and the right hilar lymph nodes.  Emphysema and diffuse bilateral bronchial wall thickening.  Coronary artery disease. 12/13/2020, PET scan showed enlarging hypermetabolic right lower paratracheal lymph node, compatible with metastatic disease.  SUV 10.3.  Mild FDG uptake of the bilateral sacral alla with associated lucency.  Concerning for sacral insufficiency fracture.  Stable posttreatment changes of the right upper lobe nodule.  Nonspecific small solid 3 mm pulmonary nodule of the left lower lobe.  2 small to be characterized.  No evidence of FDG avid metastatic disease in the abdomen or pelvis.  Aortic atherosclerosis and emphysema.  Patient reestablish care on 12/27/2020 for lung cancer treatments   #01/12/2021 patient underwent bronchoscopy biopsy by Dr. Patsey Berthold Right paratracheal lymph node fine-needle aspiration is positive for  metastatic non-small cell carcinoma, favor adenocarcinoma of lung origin.  # 02/14/2021, concurrent carboplatin AUC 2 and taxol 20m/m2  INTERVAL HISTORY Mary WOODHAMis  a 76 y.o. female who returns to clinic for follow up after receiving cycle 7 of carbo-taxol chemotherapy. Continues to tolerate treatment well. She reports ongoing fatigue which waxes and wanes. Eating and drinking well. Denies shortness of breath, chest pain. Weight is stable. Would be interested in coming in for gyn-onc/pelvic exam once she gets a little stronger.   Review of Systems  Constitutional:  Positive for fatigue. Negative for appetite change, chills and fever.  HENT:   Positive for hearing loss. Negative for voice change.   Eyes:  Negative for eye problems.  Respiratory:  Negative for chest tightness and cough.   Cardiovascular:  Negative for chest pain.  Gastrointestinal:  Negative for abdominal distention, abdominal pain and blood in stool.  Endocrine: Negative for hot flashes.  Genitourinary:  Negative for difficulty urinating and frequency.   Musculoskeletal:  Negative for arthralgias.  Skin:  Negative for itching and rash.  Neurological:  Negative for extremity weakness.  Hematological:  Negative for adenopathy.  Psychiatric/Behavioral:  Negative for confusion.    MEDICAL HISTORY:  Past Medical History:  Diagnosis Date   Arthritis    Cancer ([place])    Carotid atherosclerosis    Carotid bruit    Cervical cancer (HCC)    COPD (chronic obstructive pulmonary disease) (HCC)    emphysema   Coronary artery disease    Family history of adverse reaction to anesthesia    paternal grandfather died during surgery-pt unaware what happened   GERD (gastroesophageal reflux disease)    Hyperlipidemia    Hypertension    Hyponatremia 02/27/2019   Hypothyroidism    Infected cat bite 1980s   was hospitalized   Irregular heartbeat    Medical history non-contributory    Peripheral vascular disease ([place])     Tobacco use     SURGICAL HISTORY: Past Surgical History:  Procedure Laterality Date   CAROTID ENDARTERECTOMY Right Oct. 2015   Dr. Lucky Cowboy   CAROTID STENOSIS Left April 2016   carotid stenosis surgery   COLONOSCOPY WITH PROPOFOL N/A 11/08/2016   Procedure: COLONOSCOPY WITH PROPOFOL;  Surgeon: Jonathon Bellows, MD;  Location: [facility] ENDOSCOPY;  Service: Gastroenterology;  Laterality: N/A;   ESOPHAGOGASTRODUODENOSCOPY (EGD) WITH PROPOFOL N/A 11/08/2016   Procedure: ESOPHAGOGASTRODUODENOSCOPY (EGD) WITH PROPOFOL;  Surgeon: Jonathon Bellows, MD;  Location: [facility] ENDOSCOPY;  Service: Gastroenterology;  Laterality: N/A;   FLEXIBLE BRONCHOSCOPY N/A 12/17/2014   Procedure: FLEXIBLE BRONCHOSCOPY;  Surgeon: Wilhelmina Mcardle, MD;  Location: [facility] ORS;  Service: Pulmonary;  Laterality: N/A;   INCISION AND DRAINAGE / EXCISION THYROGLOSSAL CYST  April 2011   PORTA CATH INSERTION N/A 03/03/2021   Procedure: PORTA CATH INSERTION;  Surgeon: Algernon Huxley, MD;  Location: [facility] LAB;  Service: Cardiovascular;  Laterality: N/A;   VIDEO BRONCHOSCOPY WITH ENDOBRONCHIAL ULTRASOUND N/A 01/12/2021   Procedure: VIDEO BRONCHOSCOPY WITH ENDOBRONCHIAL ULTRASOUND;  Surgeon: Tyler Pita, MD;  Location: [facility] ORS;  Service: Cardiopulmonary;  Laterality: N/A;    SOCIAL HISTORY: Social History   Socioeconomic History   Marital status: Married    Spouse name: Deidre Ala   Number of children: Not on file   Years of education: Not on file   Highest education level: 9th grade  Occupational History   Occupation: retired   Tobacco Use   Smoking status: Every Day    Packs/day: 0.50    Years: 50.00    Pack years: 25.00    Types: Cigarettes   Smokeless tobacco: Never   Tobacco comments:    0.5  PPD 02/10/2021  Vaping Use   Vaping Use: Former  Substance and Sexual Activity   Alcohol use: No    Alcohol/week: 0.0 standard drinks   Drug use: No   Sexual activity: Yes  Other Topics Concern   Not on file  Social History  Narrative   Lives at home with husband   Social Determinants of Health   Financial Resource Strain: Low Risk    Difficulty of Paying Living Expenses: Not hard at all  Food Insecurity: No Food Insecurity   Worried About Charity fundraiser in the Last Year: Never true   Christine in the Last Year: Never true  Transportation Needs: No Transportation Needs   Lack of Transportation (Medical): No   Lack of Transportation (Non-Medical): No  Physical Activity: Inactive   Days of Exercise per Week: 0 days   Minutes of Exercise per Session: 0 min  Stress: No Stress Concern Present   Feeling of Stress : Not at all  Social Connections: Moderately Isolated   Frequency of Communication with Friends and Family: More than three times a week   Frequency of Social Gatherings with Friends and Family: More than three times a week   Attends Religious Services: Never   Marine scientist or Organizations: No   Attends Music therapist: Never   Marital Status: Married  Human resources officer Violence: Not At Risk   Fear of Current or Ex-Partner: No   Emotionally Abused: No   Physically Abused: No   Sexually Abused: No    FAMILY HISTORY: Family History  Problem Relation Age of Onset   Congestive Heart Failure Mother    Heart disease Mother    Hypertension Mother    Hypertension Sister    Diabetes Sister    Heart disease Sister    Heart disease Maternal Uncle    Heart disease Maternal Grandmother    Stroke Maternal Grandfather    Cancer Brother        liver, lung   Heart disease Brother    Hypertension Brother    Heart attack Brother    COPD Neg Hx    Breast cancer Neg Hx     ALLERGIES:  is allergic to atorvastatin.  MEDICATIONS:  Current Outpatient Medications  Medication Sig Dispense Refill   acetaminophen (TYLENOL) 500 MG tablet Take 500 mg by mouth every 6 (six) hours as needed for moderate pain.     alendronate (FOSAMAX) 70 MG tablet Take 1 tablet (70 mg  total) by mouth every 7 (seven) days. Take with a full glass of water on an empty stomach. 12 tablet 3   chlorhexidine (PERIDEX) 0.12 % solution Use as directed 5 mLs in the mouth or throat 2 (two) times daily. 120 mL 0   clopidogrel (PLAVIX) 75 MG tablet Take 1 tablet (75 mg total) by mouth daily. 90 tablet 3   cyclobenzaprine (FLEXERIL) 10 MG tablet TAKE 1 TABLET AT BEDTIME 60 tablet 3   dexamethasone (DECADRON) 4 MG tablet Take 2 tablets (8 mg total) by mouth daily. Take for 2 days after chemotherapy. . 30 tablet 1   ferrous sulfate 325 (65 FE) MG EC tablet Take 1 tablet (325 mg total) by mouth daily with breakfast. 90 tablet 4   Fluticasone-Umeclidin-Vilant (TRELEGY ELLIPTA) 100-62.5-25 MCG/INH AEPB Inhale 1 puff into the lungs daily. 1 each 11   gabapentin (NEURONTIN) 100 MG capsule Take 1 capsule in the morning and afternoon, and 3 capsules at bedtime 450  capsule 1   levothyroxine (SYNTHROID) 88 MCG tablet Take 1 tablet (88 mcg total) by mouth daily. 90 tablet 0   lidocaine (LIDODERM) 5 % Place 1 patch onto the skin daily. Remove & Discard patch within 12 hours or as directed by MD 90 patch 1   lidocaine-prilocaine (EMLA) cream Apply 1 application topically as needed. 60 g 3   lisinopril (ZESTRIL) 20 MG tablet Take 1 tablet (20 mg total) by mouth daily. (Patient taking differently: Take 20 mg by mouth.) 90 tablet 1   montelukast (SINGULAIR) 10 MG tablet Take 1 tablet (10 mg total) by mouth See admin instructions. Take 32m on the day before chemotherapy 30 tablet 0   Multiple Vitamin (MULTIVITAMIN) tablet Take 1 tablet by mouth daily.     naproxen (NAPROSYN) 500 MG tablet TAKE 1 TABLET TWICE DAILY WITH MEALS 180 tablet 1   omeprazole (PRILOSEC) 20 MG capsule Take 1 capsule (20 mg total) by mouth daily. 90 capsule 1   ondansetron (ZOFRAN) 8 MG tablet Take 1 tablet (8 mg total) by mouth 2 (two) times daily as needed for refractory nausea / vomiting. Start on day 3 after chemo. 30 tablet 1    vitamin B-12 (CYANOCOBALAMIN) 500 MCG tablet Take 500 mcg by mouth daily.     No current facility-administered medications for this visit.     PHYSICAL EXAMINATION: ECOG PERFORMANCE STATUS: 1 - Symptomatic but completely ambulatory Vitals:   04/04/21 0931  BP: (!) 150/56  Temp: (!) 94 F (34.4 C)   Filed Weights   04/04/21 0931  Weight: 138 lb 9.6 oz (62.9 kg)    Physical Exam Vitals reviewed.  Constitutional:      General: She is not in acute distress.    Appearance: She is well-developed. She is not ill-appearing.  Eyes:     General: No scleral icterus. Cardiovascular:     Rate and Rhythm: Normal rate and regular rhythm.  Pulmonary:     Effort: Pulmonary effort is normal.     Comments: Decreased air movement Abdominal:     General: There is no distension.     Palpations: Abdomen is soft.     Tenderness: There is no abdominal tenderness. There is no rebound.  Musculoskeletal:        General: No tenderness or deformity.     Right lower leg: No edema.     Left lower leg: No edema.  Skin:    General: Skin is warm and dry.     Coloration: Skin is not pale.  Neurological:     General: No focal deficit present.     Mental Status: She is alert and oriented to person, place, and time.     Motor: No weakness.  Psychiatric:        Mood and Affect: Mood normal.        Behavior: Behavior normal.    LABORATORY DATA:  I have reviewed the data as listed Lab Results  Component Value Date   WBC 4.6 04/04/2021   HGB 8.2 (L) 04/04/2021   HCT 24.5 (L) 04/04/2021   MCV 101.2 (H) 04/04/2021   PLT 270 04/04/2021   Recent Labs    03/21/21 0826 03/25/21 1134 03/28/21 0831 04/04/21 0909  NA 130* 134 131* 129*  K 4.5 5.0 4.5 4.3  CL 106 102 98 102  CO2 20* 20 25 19*  GLUCOSE 101* 86 105* 113*  BUN 22 16 25* 19  CREATININE 0.93 0.98 1.03* 0.96  CALCIUM 7.9* 8.7  9.3 8.1*  GFRNONAA >60  --  57* >60  PROT 6.2* 6.5 6.2* 5.9*  ALBUMIN 3.3* 3.9 3.2* 3.2*  AST _0 ALT _1 ALKPHOS 68 93 65 59  BILITOT 0.1* 0.3 0.1* 0.3    Iron/TIBC/Ferritin/ %Sat    Component Value Date/Time   IRON 55 10/31/2016 1546   TIBC 339 10/31/2016 1546   FERRITIN 18 10/31/2016 1546   IRONPCTSAT 16 10/31/2016 1546      RADIOGRAPHIC STUDIES: I have personally reviewed the radiological images as listed and agreed with the findings in the report. DG Chest 2 View  Result Date: 03/30/2021 CLINICAL DATA:  Patient with upper back pain. EXAM: CHEST - 2 VIEW COMPARISON:  Chest radiograph 03/07/2016. FINDINGS: Left anterior chest wall Port-A-Cath is present with tip projecting over the superior vena cava. Stable cardiac and mediastinal contours. Spiculated nodular density within the right upper hemithorax. No pleural effusion or pneumothorax. Thoracic spine degenerative changes. IMPRESSION: Spiculated nodular density within the right upper hemithorax. This may represent posttreatment changes. Recommend attention on follow-up oncologic imaging. Electronically Signed   By: Lovey Newcomer M.D.   On: 03/30/2021 21:36   DG Thoracic Spine W/Swimmers  Result Date: 03/31/2021 CLINICAL DATA:  Upper back pain. EXAM: THORACIC SPINE - 3 VIEWS COMPARISON:  CT chest 11/18/2020; X-ray chest 01/15/2010. FINDINGS: Partially visualized catheter with tip at the cavoatrial junction. There is no acute fracture or subluxation of the thoracic spine. The bones are osteopenic. Degenerative changes with disc space narrowing and spurring. The soft tissues are unremarkable. IMPRESSION: No acute/traumatic thoracic spine pathology. Electronically Signed   By: Anner Crete M.D.   On: 03/31/2021 01:23   MR Brain W Wo Contrast  Result Date: 02/01/2021 CLINICAL DATA:  Malignant neoplasm of overlapping sites of cervix ([place]) C53.8 (ICD-10-CM). Lung cancer follow up. EXAM: MRI HEAD WITHOUT AND WITH CONTRAST TECHNIQUE: Multiplanar, multiecho pulse sequences of the brain and surrounding structures were obtained  without and with intravenous contrast. CONTRAST:  25m GADAVIST GADOBUTROL 1 MMOL/ML IV SOLN COMPARISON:  MRI of the brain August 18, 2004. FINDINGS: Brain: No acute infarction, hemorrhage, hydrocephalus, extra-axial collection or mass lesion. Scattered foci of T2 hyperintensity are seen within the white matter of cerebral hemispheres, most likely small vessel ischemia, mildly progressed since prior MRI. A few punctate foci of susceptibility artifact are seen scattered in the right cerebral hemisphere, may represent hemosiderin deposits. No focus of abnormal contrast enhancement to suggest intracranial metastatic disease. Vascular: Normal flow voids. Skull and upper cervical spine: Normal marrow signal. Sinuses/Orbits: Negative. Other: Trace mucosal thickening in the bilateral mastoid cells. Epidermal inclusion cyst measuring 1.4 cm posteriorly in the neck subcutaneous. IMPRESSION: 1. No evidence of intracranial metastatic disease. 2. Mild-to-moderate chronic microvascular ischemic changes of the white. Electronically Signed   By: KPedro EarlsM.D.   On: 02/01/2021 13:53   MR Lumbar Spine Wo Contrast  Result Date: 01/21/2021 CLINICAL DATA:  Low back pain, increased fracture risk ?sacral insuffiency fracture on PET scan EXAM: MRI LUMBAR SPINE WITHOUT CONTRAST TECHNIQUE: Multiplanar, multisequence MR imaging of the lumbar spine was performed. No intravenous contrast was administered. COMPARISON:  PET-CT 12/13/2020, x-ray 05/20/2020 FINDINGS: Segmentation: Transitional lumbosacral anatomy with partial sacralization of the L5 segment. Lowest rib-bearing segment is labeled as T12 (axial series 8, image 6). Alignment:  Trace retrolisthesis of L4 on L5. Vertebrae: Mild superior endplate compression fracture of L5 with approximately 15% vertebral body height loss. Mild bone  marrow edema within the L5 vertebral body. There is extensive bone marrow edema within the bilateral sacral ala associated with  bilateral sacral fractures, likely subacute given findings on previous PET-CT. Remaining vertebral bodies of the lumbar spine are intact with preserved height. No additional fracture. No evidence of discitis. No suspicious marrow replacing bone lesion. Conus medullaris and cauda equina: Conus extends to the L1 level. Conus and cauda equina appear normal. Paraspinal and other soft tissues: Negative. Disc levels: T12-L1: Minimal disc bulge. Mild bilateral facet arthropathy. No foraminal or canal stenosis. L1-L2: Minimal disc bulge with small left foraminal protrusion. Mild bilateral facet hypertrophy. No foraminal or canal stenosis. L2-L3: Mild annular disc bulge. Mild bilateral facet arthropathy with ligamentum flavum buckling. Findings result in mild canal stenosis. No significant foraminal stenosis. L3-L4: Mild annular disc bulge. Mild bilateral facet arthropathy with ligamentum flavum buckling. Findings result in mild canal stenosis. No significant foraminal stenosis. L4-L5: Retrolisthesis with disc bulge and endplate ridging. Mild-moderate bilateral facet arthropathy with ligamentum flavum buckling. Mild bilateral subarticular recess stenosis without canal stenosis. No significant foraminal stenosis. L5-S1: Transitional level.  No evidence of impingement. IMPRESSION: 1. Transitional lumbosacral anatomy, as above. 2. Subacute-appearing mild superior endplate compression fracture of L5 with approximately 15% vertebral body height loss. 3. Extensive bone marrow edema within the bilateral sacral ala associated with bilateral sacral fractures, likely subacute given findings on previous PET-CT. 4. Mild multilevel degenerative changes of the lumbar spine as described. 5. Mild canal stenosis at L2-3 and L3-4. 6. Mild bilateral subarticular recess stenosis at L4-5. Electronically Signed   By: Davina Poke D.O.   On: 01/21/2021 09:10   PERIPHERAL VASCULAR CATHETERIZATION  Result Date: 03/03/2021 See surgical note  for result.  DG Bone Density  Result Date: 01/25/2021 EXAM: DUAL X-RAY ABSORPTIOMETRY (DXA) FOR BONE MINERAL DENSITY IMPRESSION: crr Your patient Eydie Wormley completed a BMD test on 01/25/2021 using the [place] (analysis version: 14.10) manufactured by [employer]. The following summarizes the results of our evaluation. PATIENT BIOGRAPHICAL: Name: Klaira, Pesci Patient ID: [id number] Birth Date: 06-Dec-1945 Height: 67.2 in. Gender: Female Exam Date: 01/25/2021 Weight: 132.2 lbs. Indications: Advanced Age, arthritis, Caucasian, copd, emphysema, Height Loss, History of Fracture (Adult), hx cervical ca, hypothyroid, Low Body Weight, lung ca, POSTmenopausal, Tobacco User Fractures: coccyx Treatments: GABAPENTIN, levothyroxine, multivitamin, plavix, trelegy ASSESSMENT: The BMD measured at Femur Total Right is 0.522 g/cm2 with a T-score of -3.9. This patient is considered osteoporotic according to [facility]) criteria. Lumbar spine was not utilized due to advanced degenerative changes.The scan quality is limited by exclusion of L-spine. Site Region Measured Measured WHO Young Adult BMD Date       Age      Classification T-score DualFemur Total Right 01/25/2023 73.10 Osteoporosis -3.9 0.522 g/cm2 Left Forearm Radius 33% 01/25/2021 73.8 Osteoporosis -3.7 0.556 g/cm2 World Health Organization [facility]) criteria for post-menopausal, Caucasian Women: Normal:       T-score at or above -1 SD Osteopenia:   T-score between -1 and -2.5 SD Osteoporosis: T-score at or below -2.5 SD RECOMMENDATIONS: 1. All patients should optimize calcium and vitamin D intake. 2. Consider FDA-approved medical therapies in postmenopausal women and men aged 28 years and older, based on the following: a. A hip or vertebral (clinical or morphometric) fracture b. T-score < -2.5 at the femoral neck or spine after appropriate evaluation to exclude secondary causes c. Low bone mass (T-score between -1.0  and -2.5 at the femoral neck or spine)  and a 10-year probability of a hip fracture > 3% or a 10-year probability of a major osteoporosis-related fracture > 20% based on the [place]-adapted WHO algorithm d. Clinician judgment and/or patient preferences may indicate treatment for people with 10-year fracture probabilities above or below these levels FOLLOW-UP: People with diagnosed cases of osteoporosis or at high risk for fracture should have regular bone mineral density tests. For patients eligible for Medicare, routine testing is allowed once every 2 years. The testing frequency can be increased to one year for patients who have rapidly progressing disease, those who are receiving or discontinuing medical therapy to restore bone mass, or have additional risk factors. I have reviewed this report, and agree with the above findings. [facility] Radiology Electronically Signed   By: Franki Cabot M.D.   On: 01/25/2021 15:11   MM 3D SCREEN BREAST BILATERAL  Result Date: 01/26/2021 CLINICAL DATA:  Screening. EXAM: DIGITAL SCREENING BILATERAL MAMMOGRAM WITH TOMOSYNTHESIS AND CAD TECHNIQUE: Bilateral screening digital craniocaudal and mediolateral oblique mammograms were obtained. Bilateral screening digital breast tomosynthesis was performed. The images were evaluated with computer-aided detection. COMPARISON:  None. ACR Breast Density Category c: The breast tissue is heterogeneously dense, which may obscure small masses. FINDINGS: In the right breast, calcifications warrant further evaluation with magnified views. In the left breast, no findings suspicious for malignancy. IMPRESSION: Further evaluation is suggested for calcifications in the right breast. RECOMMENDATION: Diagnostic mammogram of the right breast. (Code:[id number]) The patient will be contacted regarding the findings, and additional imaging will be scheduled. BI-RADS CATEGORY  0: Incomplete. Need additional imaging evaluation and/or prior mammograms for  comparison. Electronically Signed   By: Nolon Nations M.D.   On: 01/26/2021 07:35      ASSESSMENT & PLAN:  No diagnosis found.  Cancer Staging  Cervical cancer ([place]) Staging form: Cervix Uteri, AJCC Version 9 - Clinical: FIGO Stage IIICr (cTX, cN1) - Unsigned  Primary lung adenocarcinoma ([place]) Staging form: Lung, AJCC 7th Edition - Clinical stage from 01/12/2021: T1, N1, M0 - Signed by [name], MD on 01/29/2021   #Stage III lung adenocarcinoma, recurrent.  On concurrent chemotherapy and radiation. Labs are reviewed and discussed with patient. She has now completed chemo and radiation. Plan to move to maintenance durvalumab with Dr. Tasia Catchings.   # Chemo/RT induced anemia- hemoglobin is stable at 8.2. Has not worsened with last treatments. She is mildly symptomatic and prefers to hold off on transfusion.    #Hypertension, Continue current BP regimen.   # Locally advanced cervical cancer stage IIIC1 with pelvic nodes involvement. S/p concurrent chemotherapy and radiation followed by brachytherapy.  She is not actively following up with gynecology oncology.  I recommend patient to reestablish care. Last pelvic exam 03/17/20. She will reschedule to see gyn-onc in next couple of months as she recovers from treatment as above.   # Hypocalcemia- corrected calcium 8.74 mg/dL. On Fosamax. Recommend calcium 1200 mg daily along with vitamin d. Potassium has improved.   All questions were answered. The patient knows to call the clinic with any problems questions or concerns.   Return of visit:  CT scan as scheduled Follow up with Dr. Tasia Catchings for consideration of durvalumab week or so after   Beckey Rutter, [place], AGNP-C [place] [facility] [phone] (clinic) 04/04/2021

## 2021-04-05 ENCOUNTER — Encounter: Payer: Self-pay | Admitting: Oncology

## 2021-04-06 ENCOUNTER — Inpatient Hospital Stay: Payer: Medicare PPO

## 2021-04-06 ENCOUNTER — Inpatient Hospital Stay: Payer: Medicare PPO | Admitting: Oncology

## 2021-04-20 ENCOUNTER — Other Ambulatory Visit: Payer: Self-pay | Admitting: Family Medicine

## 2021-04-21 NOTE — Telephone Encounter (Signed)
Requested Prescriptions  ?Pending Prescriptions Disp Refills  ?? omeprazole (PRILOSEC) 20 MG capsule [Pharmacy Med Name: OMEPRAZOLE 20 MG Capsule Delayed Release] 90 capsule 2  ?  Sig: TAKE 1 CAPSULE EVERY DAY  ?  ? Gastroenterology: Proton Pump Inhibitors Passed - 04/20/2021 12:58 AM  ?  ?  Passed - Valid encounter within last 12 months  ?  Recent Outpatient Visits   ?      ? 3 weeks ago LUQ pain  ? [place], Megan P, DO  ? 3 months ago Essential hypertension  ? [place], Megan P, DO  ? 5 months ago Essential hypertension  ? [place], DO  ? 9 months ago Grief  ? [place], DO  ? 10 months ago Grief  ? [place], [place] P, DO  ?  ?  ? ?  ?  ?  ? ? ?

## 2021-04-25 ENCOUNTER — Ambulatory Visit
Admission: RE | Admit: 2021-04-25 | Discharge: 2021-04-25 | Disposition: A | Discharge status: Home / Self Care | Payer: Medicare PPO | Source: Ambulatory Visit | Attending: Oncology | Admitting: Oncology

## 2021-04-25 ENCOUNTER — Other Ambulatory Visit: Payer: Self-pay

## 2021-04-25 DIAGNOSIS — R911 Solitary pulmonary nodule: Secondary | ICD-10-CM | POA: Diagnosis not present

## 2021-04-25 DIAGNOSIS — C3490 Malignant neoplasm of unspecified part of unspecified bronchus or lung: Secondary | ICD-10-CM | POA: Diagnosis not present

## 2021-04-25 DIAGNOSIS — J432 Centrilobular emphysema: Secondary | ICD-10-CM | POA: Diagnosis not present

## 2021-04-25 DIAGNOSIS — C3491 Malignant neoplasm of unspecified part of right bronchus or lung: Secondary | ICD-10-CM | POA: Diagnosis not present

## 2021-04-25 DIAGNOSIS — R918 Other nonspecific abnormal finding of lung field: Secondary | ICD-10-CM | POA: Diagnosis not present

## 2021-04-25 MED ORDER — HEPARIN SOD (PORK) LOCK FLUSH 100 UNIT/ML IV SOLN
INTRAVENOUS | Status: AC
  Filled 2021-04-25: qty 5

## 2021-04-25 MED ORDER — IOHEXOL 300 MG/ML  SOLN
75.0000 mL | Freq: Once | INTRAMUSCULAR | Status: AC | PRN
  Administered 2021-04-25: 75 mL via INTRAVENOUS

## 2021-04-27 ENCOUNTER — Inpatient Hospital Stay: Payer: Medicare PPO

## 2021-04-27 ENCOUNTER — Inpatient Hospital Stay: Payer: Medicare PPO | Admitting: Oncology

## 2021-05-02 ENCOUNTER — Ambulatory Visit
Admission: RE | Admit: 2021-05-02 | Discharge: 2021-05-02 | Disposition: A | Discharge status: Home / Self Care | Payer: Medicare PPO | Source: Ambulatory Visit | Attending: Radiation Oncology | Admitting: Radiation Oncology

## 2021-05-02 ENCOUNTER — Encounter: Payer: Self-pay | Admitting: Radiation Oncology

## 2021-05-02 VITALS — BP 151/65 | HR 99 | Temp 98.3°F | Resp 18 | Ht 68.0 in | Wt 140.1 lb

## 2021-05-02 DIAGNOSIS — C3411 Malignant neoplasm of upper lobe, right bronchus or lung: Secondary | ICD-10-CM | POA: Insufficient documentation

## 2021-05-02 DIAGNOSIS — C770 Secondary and unspecified malignant neoplasm of lymph nodes of head, face and neck: Secondary | ICD-10-CM | POA: Diagnosis not present

## 2021-05-02 DIAGNOSIS — Z9221 Personal history of antineoplastic chemotherapy: Secondary | ICD-10-CM | POA: Insufficient documentation

## 2021-05-02 DIAGNOSIS — Z923 Personal history of irradiation: Secondary | ICD-10-CM | POA: Insufficient documentation

## 2021-05-02 DIAGNOSIS — R911 Solitary pulmonary nodule: Secondary | ICD-10-CM

## 2021-05-02 DIAGNOSIS — Z8541 Personal history of malignant neoplasm of cervix uteri: Secondary | ICD-10-CM | POA: Insufficient documentation

## 2021-05-02 NOTE — Progress Notes (Signed)
Radiation Oncology ?Follow up Note ? ?Name: Mary Hoover   ?Date:   05/02/2021 ?MRN:  [id number] ?DOB: 04-Dec-1945  ? ? ?This 76 y.o. female presents to the clinic today for 1 month follow-up status post concurrent chemoradiation therapy for stage III.  Adenocarcinoma of the right lung in patient previously treated for cervical cancer and SBRT to her left lung ? ?REFERRING PROVIDER: [name], DO ? ?HPI: Patient is a 76 year old female now out 1 month having completed concurrent chemoradiation therapy for stage III adenocarcinoma of the right lung..  She is also previously received radiation therapy as well as weekly CarboTaxol for squamous cell carcinoma of the cervix back in 22.  She is seen today 1 month out and is doing well specifically denies hemoptysis chest tightness dysphagia does have a mild nonproductive cough.  Had a recent CT scan of her chest showing evolving postradiation change in the posterior right upper lobe.  The mild right hilar and mediastinal adenopathy is stable to decrease in size.  No evidence of progressive metastatic disease in the chest is noted. ? ?COMPLICATIONS OF TREATMENT: none ? ?FOLLOW UP COMPLIANCE: keeps appointments  ? ?PHYSICAL EXAM:  ?BP (!) 151/65   Pulse 99   Temp 98.3 ?F (36.8 ?C)   Resp 18   Ht 5\' 8"  (1.727 m)   Wt 140 lb 1.6 oz (63.5 kg)   BMI 21.30 kg/m?  ?Well-developed well-nourished patient in NAD. HEENT reveals PERLA, EOMI, discs not visualized.  Oral cavity is clear. No oral mucosal lesions are identified. Neck is clear without evidence of cervical or supraclavicular adenopathy. Lungs are clear to A&P. Cardiac examination is essentially unremarkable with regular rate and rhythm without murmur rub or thrill. Abdomen is benign with no organomegaly or masses noted. Motor sensory and DTR levels are equal and symmetric in the upper and lower extremities. Cranial nerves II through XII are grossly intact. Proprioception is intact. No peripheral adenopathy or  edema is identified. No motor or sensory levels are noted. Crude visual fields are within normal range. ? ?RADIOLOGY RESULTS: CT scan reviewed compatible with above-stated findings ? ?PLAN: Present time patient is 1 month out from concurrent treatment and is doing well no significant side effects or complaints CT scan showing evolving radiation changes.  On pleased with her overall progress have asked to see her back in 4 to 5 months for follow-up.  She continues close follow-up care with Dr. Tasia Catchings. ? ?I would like to take this opportunity to thank you for allowing me to participate in the care of your patient.. ?  ? Noreene Filbert, MD ? ?

## 2021-05-04 ENCOUNTER — Other Ambulatory Visit: Payer: Self-pay

## 2021-05-04 ENCOUNTER — Encounter: Payer: Self-pay | Admitting: Oncology

## 2021-05-04 ENCOUNTER — Inpatient Hospital Stay (HOSPITAL_BASED_OUTPATIENT_CLINIC_OR_DEPARTMENT_OTHER): Payer: Medicare PPO | Admitting: Oncology

## 2021-05-04 ENCOUNTER — Inpatient Hospital Stay: Payer: Medicare PPO

## 2021-05-04 ENCOUNTER — Inpatient Hospital Stay: Payer: Medicare PPO | Attending: Oncology

## 2021-05-04 VITALS — BP 172/85 | HR 90 | Temp 96.9°F | Ht 68.0 in | Wt 141.1 lb

## 2021-05-04 DIAGNOSIS — Z5112 Encounter for antineoplastic immunotherapy: Secondary | ICD-10-CM | POA: Diagnosis not present

## 2021-05-04 DIAGNOSIS — Z9221 Personal history of antineoplastic chemotherapy: Secondary | ICD-10-CM | POA: Diagnosis not present

## 2021-05-04 DIAGNOSIS — R5383 Other fatigue: Secondary | ICD-10-CM | POA: Diagnosis not present

## 2021-05-04 DIAGNOSIS — Z79899 Other long term (current) drug therapy: Secondary | ICD-10-CM | POA: Insufficient documentation

## 2021-05-04 DIAGNOSIS — T451X5A Adverse effect of antineoplastic and immunosuppressive drugs, initial encounter: Secondary | ICD-10-CM

## 2021-05-04 DIAGNOSIS — D6481 Anemia due to antineoplastic chemotherapy: Secondary | ICD-10-CM | POA: Diagnosis not present

## 2021-05-04 DIAGNOSIS — C539 Malignant neoplasm of cervix uteri, unspecified: Secondary | ICD-10-CM | POA: Insufficient documentation

## 2021-05-04 DIAGNOSIS — Z923 Personal history of irradiation: Secondary | ICD-10-CM | POA: Insufficient documentation

## 2021-05-04 DIAGNOSIS — E039 Hypothyroidism, unspecified: Secondary | ICD-10-CM | POA: Diagnosis not present

## 2021-05-04 DIAGNOSIS — F17210 Nicotine dependence, cigarettes, uncomplicated: Secondary | ICD-10-CM | POA: Insufficient documentation

## 2021-05-04 DIAGNOSIS — C3491 Malignant neoplasm of unspecified part of right bronchus or lung: Secondary | ICD-10-CM | POA: Diagnosis not present

## 2021-05-04 LAB — COMPREHENSIVE METABOLIC PANEL
ALT: 11 U/L (ref 0–44)
AST: 18 U/L (ref 15–41)
Albumin: 3.4 g/dL — ABNORMAL LOW (ref 3.5–5.0)
Alkaline Phosphatase: 60 U/L (ref 38–126)
Anion gap: 5 (ref 5–15)
BUN: 18 mg/dL (ref 8–23)
CO2: 22 mmol/L (ref 22–32)
Calcium: 8.6 mg/dL — ABNORMAL LOW (ref 8.9–10.3)
Chloride: 106 mmol/L (ref 98–111)
Creatinine, Ser: 0.95 mg/dL (ref 0.44–1.00)
GFR, Estimated: >60 mL/min (ref >60)
Glucose, Bld: 106 mg/dL — ABNORMAL HIGH (ref 70–99)
Potassium: 4.3 mmol/L (ref 3.5–5.1)
Sodium: 133 mmol/L — ABNORMAL LOW (ref 135–145)
Total Bilirubin: 0.5 mg/dL (ref 0.3–1.2)
Total Protein: 6.1 g/dL — ABNORMAL LOW (ref 6.5–8.1)

## 2021-05-04 LAB — CBC WITH DIFFERENTIAL/PLATELET
Abs Immature Granulocytes: 0.02 10*3/uL (ref 0.00–0.07)
Basophils Absolute: 0 10*3/uL (ref 0.0–0.1)
Basophils Relative: 0 %
Eosinophils Absolute: 0.4 10*3/uL (ref 0.0–0.5)
Eosinophils Relative: 6 %
HCT: 26.7 % — ABNORMAL LOW (ref 36.0–46.0)
Hemoglobin: 8.8 g/dL — ABNORMAL LOW (ref 12.0–15.0)
Immature Granulocytes: 0 %
Lymphocytes Relative: 5 %
Lymphs Abs: 0.4 10*3/uL — ABNORMAL LOW (ref 0.7–4.0)
MCH: 34.8 pg — ABNORMAL HIGH (ref 26.0–34.0)
MCHC: 33 g/dL (ref 30.0–36.0)
MCV: 105.5 fL — ABNORMAL HIGH (ref 80.0–100.0)
Monocytes Absolute: 0.5 10*3/uL (ref 0.1–1.0)
Monocytes Relative: 8 %
Neutro Abs: 5.4 10*3/uL (ref 1.7–7.7)
Neutrophils Relative %: 81 %
Platelets: 250 10*3/uL (ref 150–400)
RBC: 2.53 MIL/uL — ABNORMAL LOW (ref 3.87–5.11)
RDW: 15.3 % (ref 11.5–15.5)
WBC: 6.7 10*3/uL (ref 4.0–10.5)
nRBC: 0 % (ref 0.0–0.2)

## 2021-05-04 LAB — VITAMIN B12: Vitamin B-12: 675 pg/mL (ref 180–914)

## 2021-05-04 LAB — RETIC PANEL
Immature Retic Fract: 16.6 % — ABNORMAL HIGH (ref 2.3–15.9)
RBC.: 2.52 MIL/uL — ABNORMAL LOW (ref 3.87–5.11)
Retic Count, Absolute: 74.7 10*3/uL (ref 19.0–186.0)
Retic Ct Pct: 2.9 % (ref 0.4–3.1)
Reticulocyte Hemoglobin: 37.3 pg (ref >27.9)

## 2021-05-04 LAB — IRON AND TIBC
Iron: 90 ug/dL (ref 28–170)
Saturation Ratios: 28 % (ref 10.4–31.8)
TIBC: 321 ug/dL (ref 250–450)
UIBC: 231 ug/dL

## 2021-05-04 LAB — FOLATE: Folate: 36 ng/mL (ref >5.9)

## 2021-05-04 LAB — FERRITIN: Ferritin: 52 ng/mL (ref 11–307)

## 2021-05-04 MED ORDER — SODIUM CHLORIDE 0.9% FLUSH
10.0000 mL | Freq: Once | INTRAVENOUS | Status: AC
  Administered 2021-05-04: 10 mL via INTRAVENOUS
  Filled 2021-05-04: qty 10

## 2021-05-04 MED ORDER — HEPARIN SOD (PORK) LOCK FLUSH 100 UNIT/ML IV SOLN
500.0000 [IU] | Freq: Once | INTRAVENOUS | Status: AC
  Administered 2021-05-04: 500 [IU] via INTRAVENOUS
  Filled 2021-05-04: qty 5

## 2021-05-04 NOTE — Progress Notes (Signed)
DISCONTINUE ON PATHWAY REGIMEN - Non-Small Cell Lung ? ? ?  A cycle is every 14 days: ?    Durvalumab  ? ?**Always confirm dose/schedule in your pharmacy ordering system** ? ?REASON: Other Reason ?PRIOR TREATMENT: [id number]: Durvalumab 10 mg/kg q14 Days x up to 12 Months ?TREATMENT RESPONSE: Stable Disease (SD) ? ?START ON PATHWAY REGIMEN - Non-Small Cell Lung ? ? ?  A cycle is every 14 days: ?    Durvalumab  ? ?**Always confirm dose/schedule in your pharmacy ordering system** ? ?Patient Characteristics: ?Preoperative or Nonsurgical Candidate (Clinical Staging), Stage III - Nonsurgical Candidate (Nonsquamous and Squamous), PS = 0, 1 ?Therapeutic Status: Preoperative or Nonsurgical Candidate (Clinical Staging) ?AJCC T Category: cTX ?AJCC N Category: cNX ?AJCC M Category: cM0 ?AJCC 8 Stage Grouping: IIIA ?ECOG Performance Status: 1 ?Intent of Therapy: ?Curative Intent, Discussed with Patient ?

## 2021-05-04 NOTE — Progress Notes (Signed)
Patient here for follow up. Per patient has swelling foot and legs x 1 month  ?

## 2021-05-04 NOTE — Progress Notes (Signed)
Per MD no treatment today. Patient port flushes well, deaccessed. No concerns voiced, patient discharged, stable. AVS given. ?

## 2021-05-04 NOTE — Progress Notes (Signed)
?Hematology/Oncology progress note ?[phone] [phone] ? ? ?Patient Care Team: ?[name], DO as PCP - General (Family Medicine) ?Clent Jacks, RN as Oncology Nurse Navigator ?Noreene Filbert, MD as Radiation Oncologist (Radiation Oncology) ?Telford Nab, RN as Sales executive ?[name], MD as Consulting Physician (Oncology) ? ?REFERRING PROVIDER: ?[name], DO  ?CHIEF COMPLAINTS/REASON FOR VISIT:  ?Follow up lung cancer treatments ? ?HISTORY OF PRESENTING ILLNESS:  ?# Cervix cancer ?Patient has developed postmenopausal vaginal bleeding and discharge.  She was noted to have a friable cervix/2 cm mass of the cervix, concerning for malignancy ?12/19/2018 endometrial biopsy showed scattered atypical squamous cells suspicious for malignancy.  Predominantly necrosis with associated inflammation. ?01/01/2019 initial cervix biopsy showed at least high-grade squamous intraepithelial lesion.-HPV negative ?01/22/2019, repeat vaginal wall biopsy and cervix 9:00 biopsy showed squamous cell carcinoma.   ? ?Staging images ?12/31/2018 showed fluid distending the endometrial canal and or endometrial thickening to the level of cervix.  No evidence of pathological lymphadenopathy.  Haziness about the lower cervix.  7 mm irregular pulmonary nodule within the right upper lobe, suspicious for possible primary or metastatic malignancy.  Chronic pleural-parenchymal scarring/fibrosis at the lung apices.  Large amount of stool.  No bowel obstruction. ?01/08/2019 PET scan showed marked hypermetabolism in the region of the cervix, compatible with the reported history of cervical cancer. ?Small bilateral pelvic sidewall lymph nodes discernible FDG accumulation, concerning for metastatic disease although neither lymph node is enlarged by CT size criteria. ?7 mm nodule in the right upper lobe shows discernible FDG accumulation.  Questionable neoplasm, primary versus metastatic. ?Emphysema.   ? ?#Chronic hearing loss ? ?# Lung nodule, FDG avid, questionable primary bronchogenic carcinoma versus metastatic cancer.Discussed with radiation oncology.  Her case was also discussed on tumor board.  Consensus reached on finishing concurrent chemoradiation treatments for cervix cancer first. Possible SBRT to lung nodule for presumed primary lung cancer. ? ?# 02/20/2020 - 03/19/2020 concurrent chemotherapy weekly carboplatin and taxol and radiation for treatment of this cervix cancer.  ?#December 2021 lung nodule,was evaluated by Dr.Oaks. Options of biopsy via transthoracic or endobronchial approach vs surgical resection.  Patient opted to empiric SBRT ? ?Patient was last seen by me on 06/25/2019.  Lost follow-up. ?She continues to follow with radiation oncology. ?11/20/2020, CT chest with contrast showed minimal residual of previously seen right upper lobe nodule.  0.6 x 0.4 cm.  Internal development of mild bandlike radiation fibrosis.  Newly enlarged pretracheal lymph node, measuring 2.5 x 1.7 cm.  Highly concerning for metastatic disease.  Unchanged prominent AP window and the right hilar lymph nodes.  Emphysema and diffuse bilateral bronchial wall thickening.  Coronary artery disease. ?12/13/2020, PET scan showed enlarging hypermetabolic right lower paratracheal lymph node, compatible with metastatic disease.  SUV 10.3.  Mild FDG uptake of the bilateral sacral alla with associated lucency.  Concerning for sacral insufficiency fracture.  Stable posttreatment changes of the right upper lobe nodule.  Nonspecific small solid 3 mm pulmonary nodule of the left lower lobe.  2 small to be characterized.  No evidence of FDG avid metastatic disease in the abdomen or pelvis.  Aortic atherosclerosis and emphysema. ? ?Patient reestablish care on 12/27/2020 for lung cancer treatments  ? ?#01/12/2021 patient underwent bronchoscopy biopsy by Dr. Patsey Berthold ?Right paratracheal lymph node fine-needle aspiration is positive for  metastatic non-small cell carcinoma, favor adenocarcinoma of lung origin. ? ?# 02/14/2021, concurrent carboplatin AUC 2 and taxol 22m/m2 ? ?INTERVAL HISTORY ?Mary POUCHERis a  76 y.o. female who has above history reviewed by me today presents for follow up visit for management of possible recurrent lung cancer.  History of cervix cancer.   ?03/31/2021 patient finished concurrent chemotherapy and radiation. ?She reports feeling tired and fatigued.  Mild swelling of lower extremity. ?Denies any fever, which chills, nausea vomiting diarrhea. ? ? ? ?Review of Systems  ?Constitutional:  Positive for fatigue. Negative for appetite change, chills and fever.  ?HENT:   Positive for hearing loss. Negative for voice change.   ?Eyes:  Negative for eye problems.  ?Respiratory:  Negative for chest tightness and cough.   ?Cardiovascular:  Negative for chest pain.  ?Gastrointestinal:  Negative for abdominal distention, abdominal pain and blood in stool.  ?Endocrine: Negative for hot flashes.  ?Genitourinary:  Negative for difficulty urinating and frequency.   ?Musculoskeletal:  Negative for arthralgias.  ?Skin:  Negative for itching and rash.  ?Neurological:  Negative for extremity weakness.  ?Hematological:  Negative for adenopathy.  ?Psychiatric/Behavioral:  Negative for confusion.   ? ?MEDICAL HISTORY:  ?Past Medical History:  ?Diagnosis Date  ? Arthritis   ? Cancer [facility])   ? Carotid atherosclerosis   ? Carotid bruit   ? Cervical cancer ([place])   ? COPD (chronic obstructive pulmonary disease) ([place])   ? emphysema  ? Coronary artery disease   ? Family history of adverse reaction to anesthesia   ? paternal grandfather died during surgery-pt unaware what happened  ? GERD (gastroesophageal reflux disease)   ? Hyperlipidemia   ? Hypertension   ? Hyponatremia 02/27/2019  ? Hypothyroidism   ? Infected cat bite 1980s  ? was hospitalized  ? Irregular heartbeat   ? Medical history non-contributory   ? Peripheral vascular disease ([place])   ?  Tobacco use   ? ? ?SURGICAL HISTORY: ?Past Surgical History:  ?Procedure Laterality Date  ? CAROTID ENDARTERECTOMY Right Oct. 2015  ? Dr. Lucky Cowboy  ? CAROTID STENOSIS Left April 2016  ? carotid stenosis surgery  ? COLONOSCOPY WITH PROPOFOL N/A 11/08/2016  ? Procedure: COLONOSCOPY WITH PROPOFOL;  Surgeon: Jonathon Bellows, MD;  Location[facility] ENDOSCOPY;  Service: Gastroenterology;  Laterality: N/A;  ? ESOPHAGOGASTRODUODENOSCOPY (EGD) WITH PROPOFOL N/A 11/08/2016  ? Procedure: ESOPHAGOGASTRODUODENOSCOPY (EGD) WITH PROPOFOL;  Surgeon: Jonathon Bellows, MD;  Location: [facility] ENDOSCOPY;  Service: Gastroenterology;  Laterality: N/A;  ? FLEXIBLE BRONCHOSCOPY N/A 12/17/2014  ? Procedure: FLEXIBLE BRONCHOSCOPY;  Surgeon: Wilhelmina Mcardle, MD;  Location: [facility] ORS;  Service: Pulmonary;  Laterality: N/A;  ? INCISION AND DRAINAGE / EXCISION THYROGLOSSAL CYST  April 2011  ? PORTA CATH INSERTION N/A 03/03/2021  ? Procedure: PORTA CATH INSERTION;  Surgeon: Algernon Huxley, MD;  Location: [place] CV LAB;  Service: Cardiovascular;  Laterality: N/A;  ? VIDEO BRONCHOSCOPY WITH ENDOBRONCHIAL ULTRASOUND N/A 01/12/2021  ? Procedure: VIDEO BRONCHOSCOPY WITH ENDOBRONCHIAL ULTRASOUND;  Surgeon: Tyler Pita, MD;  Location: [facility] ORS;  Service: Cardiopulmonary;  Laterality: N/A;  ? ? ?SOCIAL HISTORY: ?Social History  ? ?Socioeconomic History  ? Marital status: Married  ?  Spouse name: Deidre Ala  ? Number of children: Not on file  ? Years of education: Not on file  ? Highest education level: 9th grade  ?Occupational History  ? Occupation: retired   ?Tobacco Use  ? Smoking status: Every Day  ?  Packs/day: 0.50  ?  Years: 50.00  ?  Pack years: 25.00  ?  Types: Cigarettes  ? Smokeless tobacco: Never  ? Tobacco comments:  ?  0.5 PPD  02/10/2021  ?Vaping Use  ? Vaping Use: Former  ?Substance and Sexual Activity  ? Alcohol use: No  ?  Alcohol/week: 0.0 standard drinks  ? Drug use: No  ? Sexual activity: Yes  ?Other Topics Concern  ? Not on file  ?Social History  Narrative  ? Lives at home with husband  ? ?Social Determinants of Health  ? ?Financial Resource Strain: Low Risk   ? Difficulty of Paying Living Expenses: Not hard at all  ?Food Insecurity: No Food Insecurity

## 2021-05-12 NOTE — Progress Notes (Signed)
Pharmacist Chemotherapy Monitoring - Initial Assessment   ? ?Anticipated start date: 05/19/21  ? ?The following has been reviewed per standard work regarding the patient's treatment regimen: ?The patient's diagnosis, treatment plan and drug doses, and organ/hematologic function ?Lab orders and baseline tests specific to treatment regimen  ?The treatment plan start date, drug sequencing, and pre-medications ?Prior authorization status  ?Patient's documented medication list, including drug-drug interaction screen and prescriptions for anti-emetics and supportive care specific to the treatment regimen ?The drug concentrations, fluid compatibility, administration routes, and timing of the medications to be used ?The patient's access for treatment and lifetime cumulative dose history, if applicable  ?The patient's medication allergies and previous infusion related reactions, if applicable  ? ?Changes made to treatment plan:  ?N/A ? ?Follow up needed:  ?N/A ? ? ?Adelina Mings, [facility], ?05/12/2021  10:12 AM  ?

## 2021-05-17 ENCOUNTER — Other Ambulatory Visit: Payer: Medicare PPO

## 2021-05-17 DIAGNOSIS — E034 Atrophy of thyroid (acquired): Secondary | ICD-10-CM | POA: Diagnosis not present

## 2021-05-18 ENCOUNTER — Other Ambulatory Visit: Payer: Self-pay | Admitting: Family Medicine

## 2021-05-18 LAB — TSH: TSH: 2.25 u[IU]/mL (ref 0.450–4.500)

## 2021-05-18 MED ORDER — LEVOTHYROXINE SODIUM 88 MCG PO TABS: 88.0000 ug | ORAL_TABLET | Freq: Every day | ORAL | 3 refills | Status: DC

## 2021-05-19 ENCOUNTER — Inpatient Hospital Stay: Payer: Medicare PPO

## 2021-05-19 ENCOUNTER — Encounter: Payer: Self-pay | Admitting: Oncology

## 2021-05-19 ENCOUNTER — Inpatient Hospital Stay (HOSPITAL_BASED_OUTPATIENT_CLINIC_OR_DEPARTMENT_OTHER): Payer: Medicare PPO | Admitting: Oncology

## 2021-05-19 VITALS — BP 162/58 | HR 78 | Temp 97.5°F | Resp 16

## 2021-05-19 VITALS — BP 158/77 | HR 79 | Temp 97.5°F | Wt 138.0 lb

## 2021-05-19 DIAGNOSIS — C3491 Malignant neoplasm of unspecified part of right bronchus or lung: Secondary | ICD-10-CM

## 2021-05-19 DIAGNOSIS — Z7189 Other specified counseling: Secondary | ICD-10-CM | POA: Insufficient documentation

## 2021-05-19 DIAGNOSIS — C539 Malignant neoplasm of cervix uteri, unspecified: Secondary | ICD-10-CM | POA: Diagnosis not present

## 2021-05-19 DIAGNOSIS — D6481 Anemia due to antineoplastic chemotherapy: Secondary | ICD-10-CM | POA: Diagnosis not present

## 2021-05-19 DIAGNOSIS — R634 Abnormal weight loss: Secondary | ICD-10-CM

## 2021-05-19 DIAGNOSIS — Z5112 Encounter for antineoplastic immunotherapy: Secondary | ICD-10-CM | POA: Diagnosis not present

## 2021-05-19 DIAGNOSIS — Z923 Personal history of irradiation: Secondary | ICD-10-CM | POA: Diagnosis not present

## 2021-05-19 DIAGNOSIS — T451X5A Adverse effect of antineoplastic and immunosuppressive drugs, initial encounter: Secondary | ICD-10-CM

## 2021-05-19 DIAGNOSIS — R5383 Other fatigue: Secondary | ICD-10-CM

## 2021-05-19 DIAGNOSIS — Z9221 Personal history of antineoplastic chemotherapy: Secondary | ICD-10-CM | POA: Diagnosis not present

## 2021-05-19 DIAGNOSIS — E039 Hypothyroidism, unspecified: Secondary | ICD-10-CM | POA: Diagnosis not present

## 2021-05-19 LAB — COMPREHENSIVE METABOLIC PANEL
ALT: 11 U/L (ref 0–44)
AST: 17 U/L (ref 15–41)
Albumin: 3.5 g/dL (ref 3.5–5.0)
Alkaline Phosphatase: 60 U/L (ref 38–126)
Anion gap: 9 (ref 5–15)
BUN: 18 mg/dL (ref 8–23)
CO2: 22 mmol/L (ref 22–32)
Calcium: 9.1 mg/dL (ref 8.9–10.3)
Chloride: 103 mmol/L (ref 98–111)
Creatinine, Ser: 1.04 mg/dL — ABNORMAL HIGH (ref 0.44–1.00)
GFR, Estimated: 56 mL/min — ABNORMAL LOW (ref >60)
Glucose, Bld: 127 mg/dL — ABNORMAL HIGH (ref 70–99)
Potassium: 3.8 mmol/L (ref 3.5–5.1)
Sodium: 134 mmol/L — ABNORMAL LOW (ref 135–145)
Total Bilirubin: 0.4 mg/dL (ref 0.3–1.2)
Total Protein: 6.6 g/dL (ref 6.5–8.1)

## 2021-05-19 LAB — CBC WITH DIFFERENTIAL/PLATELET
Abs Immature Granulocytes: 0.02 10*3/uL (ref 0.00–0.07)
Basophils Absolute: 0 10*3/uL (ref 0.0–0.1)
Basophils Relative: 1 %
Eosinophils Absolute: 0.3 10*3/uL (ref 0.0–0.5)
Eosinophils Relative: 5 %
HCT: 28.8 % — ABNORMAL LOW (ref 36.0–46.0)
Hemoglobin: 9.7 g/dL — ABNORMAL LOW (ref 12.0–15.0)
Immature Granulocytes: 0 %
Lymphocytes Relative: 8 %
Lymphs Abs: 0.4 10*3/uL — ABNORMAL LOW (ref 0.7–4.0)
MCH: 34.9 pg — ABNORMAL HIGH (ref 26.0–34.0)
MCHC: 33.7 g/dL (ref 30.0–36.0)
MCV: 103.6 fL — ABNORMAL HIGH (ref 80.0–100.0)
Monocytes Absolute: 0.4 10*3/uL (ref 0.1–1.0)
Monocytes Relative: 9 %
Neutro Abs: 3.8 10*3/uL (ref 1.7–7.7)
Neutrophils Relative %: 77 %
Platelets: 264 10*3/uL (ref 150–400)
RBC: 2.78 MIL/uL — ABNORMAL LOW (ref 3.87–5.11)
RDW: 12.9 % (ref 11.5–15.5)
WBC: 5 10*3/uL (ref 4.0–10.5)
nRBC: 0 % (ref 0.0–0.2)

## 2021-05-19 LAB — TSH: TSH: 1.721 u[IU]/mL (ref 0.350–4.500)

## 2021-05-19 MED ORDER — HEPARIN SOD (PORK) LOCK FLUSH 100 UNIT/ML IV SOLN
500.0000 [IU] | Freq: Once | INTRAVENOUS | Status: AC | PRN
  Administered 2021-05-19: 500 [IU]
  Filled 2021-05-19: qty 5

## 2021-05-19 MED ORDER — SODIUM CHLORIDE 0.9% FLUSH
10.0000 mL | INTRAVENOUS | Status: DC | PRN
  Filled 2021-05-19: qty 10

## 2021-05-19 MED ORDER — SODIUM CHLORIDE 0.9% FLUSH
10.0000 mL | Freq: Once | INTRAVENOUS | Status: AC
  Administered 2021-05-19: 10 mL via INTRAVENOUS
  Filled 2021-05-19: qty 10

## 2021-05-19 MED ORDER — SODIUM CHLORIDE 0.9 % IV SOLN
Freq: Once | INTRAVENOUS | Status: AC
  Filled 2021-05-19: qty 250

## 2021-05-19 MED ORDER — SODIUM CHLORIDE 0.9 % IV SOLN
10.0000 mg/kg | Freq: Once | INTRAVENOUS | Status: AC
  Administered 2021-05-19: 620 mg via INTRAVENOUS
  Filled 2021-05-19: qty 10

## 2021-05-19 NOTE — Patient Instructions (Signed)
[facility] CANCER CTR AT [place]  Discharge Instructions: ?Thank you for choosing [facility] to provide your oncology and hematology care.  ?If you have a lab appointment with the [facility], please go directly to the [facility] and check in at the registration area. ? ?Wear comfortable clothing and clothing appropriate for easy access to any Portacath or PICC line.  ? ?We strive to give you quality time with your provider. You may need to reschedule your appointment if you arrive late (15 or more minutes).  Arriving late affects you and other patients whose appointments are after yours.  Also, if you miss three or more appointments without notifying the office, you may be dismissed from the clinic at the provider?s discretion.    ?  ?For prescription refill requests, have your pharmacy contact our office and allow 72 hours for refills to be completed.   ? ?Today you received the following chemotherapy and/or immunotherapy agents Imfinzi    ?  ?To help prevent nausea and vomiting after your treatment, we encourage you to take your nausea medication as directed. ? ?BELOW ARE SYMPTOMS THAT SHOULD BE REPORTED IMMEDIATELY: ?*FEVER GREATER THAN 100.4 F (38 ?C) OR HIGHER ?*CHILLS OR SWEATING ?*NAUSEA AND VOMITING THAT IS NOT CONTROLLED WITH YOUR NAUSEA MEDICATION ?*UNUSUAL SHORTNESS OF BREATH ?*UNUSUAL BRUISING OR BLEEDING ?*URINARY PROBLEMS (pain or burning when urinating, or frequent urination) ?*BOWEL PROBLEMS (unusual diarrhea, constipation, pain near the anus) ?TENDERNESS IN MOUTH AND THROAT WITH OR WITHOUT PRESENCE OF ULCERS (sore throat, sores in mouth, or a toothache) ?UNUSUAL RASH, SWELLING OR PAIN  ?UNUSUAL VAGINAL DISCHARGE OR ITCHING  ? ?Items with * indicate a potential emergency and should be followed up as soon as possible or go to the Emergency Department if any problems should occur. ? ?Please show the CHEMOTHERAPY ALERT CARD or IMMUNOTHERAPY ALERT CARD at check-in to the  Emergency Department and triage nurse. ? ?Should you have questions after your visit or need to cancel or reschedule your appointment, please contact [facility] CANCER [place] AT [place]  [phone] and follow the prompts.  Office hours are 8:00 a.m. to 4:30 p.m. Monday - Friday. Please note that voicemails left after 4:00 p.m. may not be returned until the following business day.  We are closed weekends and major holidays. You have access to a nurse at all times for urgent questions. Please call the main number to the clinic [phone] and follow the prompts. ? ?For any non-urgent questions, you may also contact your provider using MyChart. We now offer e-Visits for anyone 8 and older to request care online for non-urgent symptoms. For details visit [email or web address]. ?  ?Also download the MyChart app! Go to the app store, search "MyChart", open the app, select [facility], and log in with your MyChart username and password. ? ?Due to Covid, a mask is required upon entering the hospital/clinic. If you do not have a mask, one will be given to you upon arrival. For doctor visits, patients may have 1 support person aged 33 or older with them. For treatment visits, patients cannot have anyone with them due to current Covid guidelines and our immunocompromised population.  ?

## 2021-05-19 NOTE — Progress Notes (Signed)
?Hematology/Oncology progress note ?Telephone:([phone]) [phone] [phone] ? ? ?Patient Care Team: ?[name], DO as PCP - General (Family Medicine) ?Clent Jacks, RN as Oncology Nurse Navigator ?Noreene Filbert, MD as Radiation Oncologist (Radiation Oncology) ?Telford Nab, RN as Sales executive ?[name], MD as Consulting Physician (Oncology) ? ?REFERRING PROVIDER: ?[name], DO  ?CHIEF COMPLAINTS/REASON FOR VISIT:  ?Follow up lung cancer treatments ? ?HISTORY OF PRESENTING ILLNESS:  ?# Cervix cancer ?Patient has developed postmenopausal vaginal bleeding and discharge.  She was noted to have a friable cervix/2 cm mass of the cervix, concerning for malignancy ?12/19/2018 endometrial biopsy showed scattered atypical squamous cells suspicious for malignancy.  Predominantly necrosis with associated inflammation. ?01/01/2019 initial cervix biopsy showed at least high-grade squamous intraepithelial lesion.-HPV negative ?01/22/2019, repeat vaginal wall biopsy and cervix 9:00 biopsy showed squamous cell carcinoma.   ? ?Staging images ?12/31/2018 showed fluid distending the endometrial canal and or endometrial thickening to the level of cervix.  No evidence of pathological lymphadenopathy.  Haziness about the lower cervix.  7 mm irregular pulmonary nodule within the right upper lobe, suspicious for possible primary or metastatic malignancy.  Chronic pleural-parenchymal scarring/fibrosis at the lung apices.  Large amount of stool.  No bowel obstruction. ?01/08/2019 PET scan showed marked hypermetabolism in the region of the cervix, compatible with the reported history of cervical cancer. ?Small bilateral pelvic sidewall lymph nodes discernible FDG accumulation, concerning for metastatic disease although neither lymph node is enlarged by CT size criteria. ?7 mm nodule in the right upper lobe shows discernible FDG accumulation.  Questionable neoplasm, primary versus metastatic. ?Emphysema.   ? ?#Chronic hearing loss ? ?# Lung nodule, FDG avid, questionable primary bronchogenic carcinoma versus metastatic cancer.Discussed with radiation oncology.  Her case was also discussed on tumor board.  Consensus reached on finishing concurrent chemoradiation treatments for cervix cancer first. Possible SBRT to lung nodule for presumed primary lung cancer. ? ?# 02/20/2020 - 03/19/2020 concurrent chemotherapy weekly carboplatin and taxol and radiation for treatment of this cervix cancer.  ? ?#December 2021 lung nodule,was evaluated by Dr.Oaks. Options of biopsy via transthoracic or endobronchial approach vs surgical resection.  Patient opted to empiric SBRT ? ?Patient was last seen by me on 06/25/2019.  Lost follow-up. ?She continues to follow with radiation oncology. ?11/20/2020, CT chest with contrast showed minimal residual of previously seen right upper lobe nodule.  0.6 x 0.4 cm.  Internal development of mild bandlike radiation fibrosis.  Newly enlarged pretracheal lymph node, measuring 2.5 x 1.7 cm.  Highly concerning for metastatic disease.  Unchanged prominent AP window and the right hilar lymph nodes.  Emphysema and diffuse bilateral bronchial wall thickening.  Coronary artery disease. ?12/13/2020, PET scan showed enlarging hypermetabolic right lower paratracheal lymph node, compatible with metastatic disease.  SUV 10.3.  Mild FDG uptake of the bilateral sacral alla with associated lucency.  Concerning for sacral insufficiency fracture.  Stable posttreatment changes of the right upper lobe nodule.  Nonspecific small solid 3 mm pulmonary nodule of the left lower lobe.  2 small to be characterized.  No evidence of FDG avid metastatic disease in the abdomen or pelvis.  Aortic atherosclerosis and emphysema. ? ?Patient reestablish care on 12/27/2020 for lung cancer treatments  ? ?#01/12/2021 patient underwent bronchoscopy biopsy by Dr. Patsey Berthold ?Right paratracheal lymph node fine-needle aspiration is positive for  metastatic non-small cell carcinoma, favor adenocarcinoma of lung origin. ? ?Circulogen NGS negative PD-L1, ALK, ROS1, NTRK1/2/3,  ? ?# 02/14/2021-03/31/2021, concurrent carboplatin AUC 2 and  taxol 51m/m2 with radiation. ? ?04/26/2021, CT chest with contrast showed evolving postradiation changes in the posterior perifissural upper right lung without discrete residual measurable nodule in this location.  Right hilar/mediastinal lymphadenopathy stable to decreased.  Tiny 0.2 cm left lower lobe pulmonary nodule slightly decreased.  Positive response to treatment.  No new or progressive disease.  Three-vessel coronary arthrosclerosis.  Aortic atherosclerosis/emphysema. ?INTERVAL HISTORY ?Mary RASCONis a 76y.o. female who has above history reviewed by me today presents for follow up visit for management of possible recurrent lung cancer.  History of cervix cancer.   ?Patient reports feeling better today.  Fatigue is slightly improved.  Still not back to the baseline yet. ? ? ? ?Review of Systems  ?Constitutional:  Positive for fatigue. Negative for appetite change, chills and fever.  ?HENT:   Positive for hearing loss. Negative for voice change.   ?Eyes:  Negative for eye problems.  ?Respiratory:  Negative for chest tightness and cough.   ?Cardiovascular:  Negative for chest pain.  ?Gastrointestinal:  Negative for abdominal distention, abdominal pain and blood in stool.  ?Endocrine: Negative for hot flashes.  ?Genitourinary:  Negative for difficulty urinating and frequency.   ?Musculoskeletal:  Negative for arthralgias.  ?Skin:  Negative for itching and rash.  ?Neurological:  Negative for extremity weakness.  ?Hematological:  Negative for adenopathy.  ?Psychiatric/Behavioral:  Negative for confusion.   ? ?MEDICAL HISTORY:  ?Past Medical History:  ?Diagnosis Date  ? Arthritis   ? Cancer ([facility]   ? Carotid atherosclerosis   ? Carotid bruit   ? [place]   ? COPD (chronic obstructive pulmonary disease) ([place]    ? emphysema  ? Coronary artery disease   ? Family history of adverse reaction to anesthesia   ? paternal grandfather died during surgery-pt unaware what happened  ? GERD (gastroesophageal reflux disease)   ? Hyperlipidemia   ? Hypertension   ? Hyponatremia 02/27/2019  ? Hypothyroidism   ? Infected cat bite 1980s  ? was hospitalized  ? Irregular heartbeat   ? Medical history non-contributory   ? Peripheral vascular disease ([place]   ? Tobacco use   ? ? ?SURGICAL HISTORY: ?Past Surgical History:  ?Procedure Laterality Date  ? CAROTID ENDARTERECTOMY Right Oct. 2015  ? Dr. DLucky Cowboy ? CAROTID STENOSIS Left April 2016  ? carotid stenosis surgery  ? COLONOSCOPY WITH PROPOFOL N/A 11/08/2016  ? Procedure: COLONOSCOPY WITH PROPOFOL;  Surgeon: AJonathon Bellows MD;  [facility];  Service: Gastroenterology;  Laterality: N/A;  ? ESOPHAGOGASTRODUODENOSCOPY (EGD) WITH PROPOFOL N/A 11/08/2016  ? Procedure: ESOPHAGOGASTRODUODENOSCOPY (EGD) WITH PROPOFOL;  Surgeon: AJonathon Bellows MD;  [facility];  Service: Gastroenterology;  Laterality: N/A;  ? FLEXIBLE BRONCHOSCOPY N/A 12/17/2014  ? Procedure: FLEXIBLE BRONCHOSCOPY;  Surgeon: DWilhelmina Mcardle MD;  Location: [facility] ORS;  Service: Pulmonary;  Laterality: N/A;  ? INCISION AND DRAINAGE / EXCISION THYROGLOSSAL CYST  April 2011  ? PORTA CATH INSERTION N/A 03/03/2021  ? Procedure: PORTA CATH INSERTION;  Surgeon: DAlgernon Huxley MD;  [facility];  Service: Cardiovascular;  Laterality: N/A;  ? VIDEO BRONCHOSCOPY WITH ENDOBRONCHIAL ULTRASOUND N/A 01/12/2021  ? Procedure: VIDEO BRONCHOSCOPY WITH ENDOBRONCHIAL ULTRASOUND;  Surgeon: GTyler Pita MD;  Location: [facility] ORS;  Service: Cardiopulmonary;  Laterality: N/A;  ? ? ?SOCIAL HISTORY: ?Social History  ? ?Socioeconomic History  ? Marital status: Married  ?  Spouse name: RDeidre Ala ? Number of children: Not on file  ? Years of  education: Not on file  ? Highest education level: 9th grade  ?Occupational History  ?  Occupation: retired   ?Tobacco Use  ? Smoking status: Every Day  ?  Packs/day: 0.50  ?  Years: 50.00  ?  Pack years: 25.00  ?  Types: Cigarettes  ? Smokeless tobacco: Never  ? Tobacco comments:  ?  0.5 PPD

## 2021-05-20 LAB — T4, FREE: Free T4: 1.21 ng/dL — ABNORMAL HIGH (ref 0.61–1.12)

## 2021-06-03 ENCOUNTER — Inpatient Hospital Stay: Payer: Medicare PPO | Attending: Oncology

## 2021-06-03 ENCOUNTER — Inpatient Hospital Stay: Payer: Medicare PPO

## 2021-06-03 ENCOUNTER — Inpatient Hospital Stay: Payer: Medicare PPO | Admitting: Oncology

## 2021-06-03 VITALS — BP 121/72 | HR 84 | Temp 96.7°F | Resp 16 | Ht 68.0 in | Wt 136.0 lb

## 2021-06-03 DIAGNOSIS — Z8541 Personal history of malignant neoplasm of cervix uteri: Secondary | ICD-10-CM | POA: Diagnosis not present

## 2021-06-03 DIAGNOSIS — Z5112 Encounter for antineoplastic immunotherapy: Secondary | ICD-10-CM

## 2021-06-03 DIAGNOSIS — R634 Abnormal weight loss: Secondary | ICD-10-CM

## 2021-06-03 DIAGNOSIS — E785 Hyperlipidemia, unspecified: Secondary | ICD-10-CM | POA: Insufficient documentation

## 2021-06-03 DIAGNOSIS — D6481 Anemia due to antineoplastic chemotherapy: Secondary | ICD-10-CM | POA: Diagnosis not present

## 2021-06-03 DIAGNOSIS — I739 Peripheral vascular disease, unspecified: Secondary | ICD-10-CM | POA: Diagnosis not present

## 2021-06-03 DIAGNOSIS — J439 Emphysema, unspecified: Secondary | ICD-10-CM | POA: Insufficient documentation

## 2021-06-03 DIAGNOSIS — C3411 Malignant neoplasm of upper lobe, right bronchus or lung: Secondary | ICD-10-CM | POA: Insufficient documentation

## 2021-06-03 DIAGNOSIS — Z79899 Other long term (current) drug therapy: Secondary | ICD-10-CM | POA: Diagnosis not present

## 2021-06-03 DIAGNOSIS — R5383 Other fatigue: Secondary | ICD-10-CM | POA: Diagnosis not present

## 2021-06-03 DIAGNOSIS — I10 Essential (primary) hypertension: Secondary | ICD-10-CM | POA: Diagnosis not present

## 2021-06-03 DIAGNOSIS — T451X5A Adverse effect of antineoplastic and immunosuppressive drugs, initial encounter: Secondary | ICD-10-CM

## 2021-06-03 DIAGNOSIS — E039 Hypothyroidism, unspecified: Secondary | ICD-10-CM | POA: Insufficient documentation

## 2021-06-03 DIAGNOSIS — C3491 Malignant neoplasm of unspecified part of right bronchus or lung: Secondary | ICD-10-CM

## 2021-06-03 DIAGNOSIS — F17210 Nicotine dependence, cigarettes, uncomplicated: Secondary | ICD-10-CM | POA: Diagnosis not present

## 2021-06-03 DIAGNOSIS — I700 Atherosclerosis of aorta: Secondary | ICD-10-CM | POA: Insufficient documentation

## 2021-06-03 DIAGNOSIS — Z923 Personal history of irradiation: Secondary | ICD-10-CM | POA: Insufficient documentation

## 2021-06-03 DIAGNOSIS — K219 Gastro-esophageal reflux disease without esophagitis: Secondary | ICD-10-CM | POA: Diagnosis not present

## 2021-06-03 DIAGNOSIS — Z7983 Long term (current) use of bisphosphonates: Secondary | ICD-10-CM | POA: Diagnosis not present

## 2021-06-03 DIAGNOSIS — M1990 Unspecified osteoarthritis, unspecified site: Secondary | ICD-10-CM | POA: Insufficient documentation

## 2021-06-03 DIAGNOSIS — I6529 Occlusion and stenosis of unspecified carotid artery: Secondary | ICD-10-CM | POA: Diagnosis not present

## 2021-06-03 DIAGNOSIS — Z7901 Long term (current) use of anticoagulants: Secondary | ICD-10-CM | POA: Diagnosis not present

## 2021-06-03 DIAGNOSIS — I2510 Atherosclerotic heart disease of native coronary artery without angina pectoris: Secondary | ICD-10-CM | POA: Diagnosis not present

## 2021-06-03 LAB — COMPREHENSIVE METABOLIC PANEL
ALT: 10 U/L (ref 0–44)
AST: 17 U/L (ref 15–41)
Albumin: 3.5 g/dL (ref 3.5–5.0)
Alkaline Phosphatase: 59 U/L (ref 38–126)
Anion gap: 5 (ref 5–15)
BUN: 18 mg/dL (ref 8–23)
CO2: 22 mmol/L (ref 22–32)
Calcium: 8.5 mg/dL — ABNORMAL LOW (ref 8.9–10.3)
Chloride: 104 mmol/L (ref 98–111)
Creatinine, Ser: 1.08 mg/dL — ABNORMAL HIGH (ref 0.44–1.00)
GFR, Estimated: 54 mL/min — ABNORMAL LOW (ref >60)
Glucose, Bld: 93 mg/dL (ref 70–99)
Potassium: 4.4 mmol/L (ref 3.5–5.1)
Sodium: 131 mmol/L — ABNORMAL LOW (ref 135–145)
Total Bilirubin: 0.7 mg/dL (ref 0.3–1.2)
Total Protein: 6.5 g/dL (ref 6.5–8.1)

## 2021-06-03 LAB — CBC WITH DIFFERENTIAL/PLATELET
Abs Immature Granulocytes: 0.02 10*3/uL (ref 0.00–0.07)
Basophils Absolute: 0 10*3/uL (ref 0.0–0.1)
Basophils Relative: 1 %
Eosinophils Absolute: 0.4 10*3/uL (ref 0.0–0.5)
Eosinophils Relative: 6 %
HCT: 30.1 % — ABNORMAL LOW (ref 36.0–46.0)
Hemoglobin: 10.5 g/dL — ABNORMAL LOW (ref 12.0–15.0)
Immature Granulocytes: 0 %
Lymphocytes Relative: 7 %
Lymphs Abs: 0.5 10*3/uL — ABNORMAL LOW (ref 0.7–4.0)
MCH: 35 pg — ABNORMAL HIGH (ref 26.0–34.0)
MCHC: 34.9 g/dL (ref 30.0–36.0)
MCV: 100.3 fL — ABNORMAL HIGH (ref 80.0–100.0)
Monocytes Absolute: 0.6 10*3/uL (ref 0.1–1.0)
Monocytes Relative: 8 %
Neutro Abs: 5.5 10*3/uL (ref 1.7–7.7)
Neutrophils Relative %: 78 %
Platelets: 289 10*3/uL (ref 150–400)
RBC: 3 MIL/uL — ABNORMAL LOW (ref 3.87–5.11)
RDW: 11.8 % (ref 11.5–15.5)
WBC: 7 10*3/uL (ref 4.0–10.5)
nRBC: 0 % (ref 0.0–0.2)

## 2021-06-03 MED ORDER — SODIUM CHLORIDE 0.9 % IV SOLN
10.0000 mg/kg | Freq: Once | INTRAVENOUS | Status: AC
  Administered 2021-06-03: 620 mg via INTRAVENOUS
  Filled 2021-06-03: qty 10

## 2021-06-03 MED ORDER — SODIUM CHLORIDE 0.9 % IV SOLN
Freq: Once | INTRAVENOUS | Status: AC
  Filled 2021-06-03: qty 250

## 2021-06-03 MED ORDER — HEPARIN SOD (PORK) LOCK FLUSH 100 UNIT/ML IV SOLN
500.0000 [IU] | Freq: Once | INTRAVENOUS | Status: AC | PRN
  Administered 2021-06-03: 500 [IU]
  Filled 2021-06-03: qty 5

## 2021-06-03 NOTE — Progress Notes (Signed)
?Hematology/Oncology progress note ?Telephone:(336) [phone] Fax:(336) [phone] ? ? ?Patient Care Team: ?[name], DO as PCP - General (Family Medicine) ?Clent Jacks, RN as Oncology Nurse Navigator ?Noreene Filbert, MD as Radiation Oncologist (Radiation Oncology) ?Telford Nab, RN as Sales executive ?[name], MD as Consulting Physician (Oncology) ? ?REFERRING PROVIDER: ?[name], DO  ?CHIEF COMPLAINTS/REASON FOR VISIT:  ?Follow up lung cancer treatments ? ?HISTORY OF PRESENTING ILLNESS:  ?# Cervix cancer ?Patient has developed postmenopausal vaginal bleeding and discharge.  She was noted to have a friable cervix/2 cm mass of the cervix, concerning for malignancy ?12/19/2018 endometrial biopsy showed scattered atypical squamous cells suspicious for malignancy.  Predominantly necrosis with associated inflammation. ?01/01/2019 initial cervix biopsy showed at least high-grade squamous intraepithelial lesion.-HPV negative ?01/22/2019, repeat vaginal wall biopsy and cervix 9:00 biopsy showed squamous cell carcinoma.   ? ?Staging images ?12/31/2018 showed fluid distending the endometrial canal and or endometrial thickening to the level of cervix.  No evidence of pathological lymphadenopathy.  Haziness about the lower cervix.  7 mm irregular pulmonary nodule within the right upper lobe, suspicious for possible primary or metastatic malignancy.  Chronic pleural-parenchymal scarring/fibrosis at the lung apices.  Large amount of stool.  No bowel obstruction. ?01/08/2019 PET scan showed marked hypermetabolism in the region of the cervix, compatible with the reported history of cervical cancer. ?Small bilateral pelvic sidewall lymph nodes discernible FDG accumulation, concerning for metastatic disease although neither lymph node is enlarged by CT size criteria. ?7 mm nodule in the right upper lobe shows discernible FDG accumulation.  Questionable neoplasm, primary versus metastatic. ?Emphysema.   ? ?#Chronic hearing loss ? ?# Lung nodule, FDG avid, questionable primary bronchogenic carcinoma versus metastatic cancer.Discussed with radiation oncology.  Her case was also discussed on tumor board.  Consensus reached on finishing concurrent chemoradiation treatments for cervix cancer first. Possible SBRT to lung nodule for presumed primary lung cancer. ? ?# 02/20/2020 - 03/19/2020 concurrent chemotherapy weekly carboplatin and taxol and radiation for treatment of this cervix cancer.  ? ?#December 2021 lung nodule,was evaluated by Dr.Oaks. Options of biopsy via transthoracic or endobronchial approach vs surgical resection.  Patient opted to empiric SBRT ? ?Patient was last seen by me on 06/25/2019.  Lost follow-up. ?She continues to follow with radiation oncology. ?11/20/2020, CT chest with contrast showed minimal residual of previously seen right upper lobe nodule.  0.6 x 0.4 cm.  Internal development of mild bandlike radiation fibrosis.  Newly enlarged pretracheal lymph node, measuring 2.5 x 1.7 cm.  Highly concerning for metastatic disease.  Unchanged prominent AP window and the right hilar lymph nodes.  Emphysema and diffuse bilateral bronchial wall thickening.  Coronary artery disease. ?12/13/2020, PET scan showed enlarging hypermetabolic right lower paratracheal lymph node, compatible with metastatic disease.  SUV 10.3.  Mild FDG uptake of the bilateral sacral alla with associated lucency.  Concerning for sacral insufficiency fracture.  Stable posttreatment changes of the right upper lobe nodule.  Nonspecific small solid 3 mm pulmonary nodule of the left lower lobe.  2 small to be characterized.  No evidence of FDG avid metastatic disease in the abdomen or pelvis.  Aortic atherosclerosis and emphysema. ? ?Patient reestablish care on 12/27/2020 for lung cancer treatments  ? ?#01/12/2021 patient underwent bronchoscopy biopsy by Dr. Patsey Berthold ?Right paratracheal lymph node fine-needle aspiration is positive for  metastatic non-small cell carcinoma, favor adenocarcinoma of lung origin. ? ?Circulogen NGS negative PD-L1, ALK, ROS1, NTRK1/2/3,  ? ?# 02/14/2021-03/31/2021, concurrent carboplatin AUC 2 and  taxol 87m/m2 with radiation. ? ?04/26/2021, CT chest with contrast showed evolving postradiation changes in the posterior perifissural upper right lung without discrete residual measurable nodule in this location.  Right hilar/mediastinal lymphadenopathy stable to decreased.  Tiny 0.2 cm left lower lobe pulmonary nodule slightly decreased.  Positive response to treatment.  No new or progressive disease.  Three-vessel coronary arthrosclerosis.  Aortic atherosclerosis/emphysema. ?INTERVAL HISTORY ?Mary GALENOis a 76y.o. female who has above history reviewed by me today presents for follow up visit for management of possible recurrent lung cancer.  History of cervix cancer.   ?Appetite is not great.  Food does not taste well.  She lost 2 pounds since last week. Patient says her weight always fluctuate.  No mouthsore,  thrush. ? ?Review of Systems  ?Constitutional:  Positive for fatigue and unexpected weight change. Negative for appetite change, chills and fever.  ?HENT:   Positive for hearing loss. Negative for voice change.   ?Eyes:  Negative for eye problems.  ?Respiratory:  Negative for chest tightness and cough.   ?Cardiovascular:  Negative for chest pain.  ?Gastrointestinal:  Negative for abdominal distention, abdominal pain and blood in stool.  ?Endocrine: Negative for hot flashes.  ?Genitourinary:  Negative for difficulty urinating and frequency.   ?Musculoskeletal:  Negative for arthralgias.  ?Skin:  Negative for itching and rash.  ?Neurological:  Negative for extremity weakness.  ?Hematological:  Negative for adenopathy.  ?Psychiatric/Behavioral:  Negative for confusion.   ? ?MEDICAL HISTORY:  ?Past Medical History:  ?Diagnosis Date  ? Arthritis   ? [facility] ([facility]   ? Carotid atherosclerosis   ? Carotid bruit   ?  [place]   ? [place] disease) ([place]   ? emphysema  ? Coronary artery disease   ? Family history of adverse reaction to anesthesia   ? paternal grandfather died during surgery-pt unaware what happened  ? GERD (gastroesophageal reflux disease)   ? Hyperlipidemia   ? Hypertension   ? Hyponatremia 02/27/2019  ? Hypothyroidism   ? Infected cat bite 1980s  ? was hospitalized  ? Irregular heartbeat   ? Medical history non-contributory   ? [place]ipheral vascular disease ([facility] [place]   ? Tobacco use   ? ? ?SURGICAL HISTORY: ?Past Surgical History:  ?Procedure Laterality Date  ? CAROTID ENDARTERECTOMY Right Oct. 2015  ? Dr. DLucky Cowboy ? CAROTID STENOSIS Left April 2016  ? carotid stenosis surgery  ? COLONOSCOPY WITH PROPOFOL N/A 11/08/2016  ? Procedure: COLONOSCOPY WITH PROPOFOL;  Surgeon: AJonathon Bellows MD;  [facility];  Service: Gastroenterology;  Laterality: N/A;  ? ESOPHAGOGASTRODUODENOSCOPY (EGD) WITH PROPOFOL N/A 11/08/2016  ? Procedure: ESOPHAGOGASTRODUODENOSCOPY (EGD) WITH PROPOFOL;  Surgeon: AJonathon Bellows MD;  [facility];  Service: Gastroenterology;  Laterality: N/A;  ? FLEXIBLE BRONCHOSCOPY N/A 12/17/2014  ? Procedure: FLEXIBLE BRONCHOSCOPY;  Surgeon: DWilhelmina Mcardle MD;  Location: [facility];  Service: Pulmonary;  Laterality: N/A;  ? INCISION AND DRAINAGE / EXCISION THYROGLOSSAL CYST  April 2011  ? PORTA CATH INSERTION N/A 03/03/2021  ? Procedure: PORTA CATH INSERTION;  Surgeon: DAlgernon Huxley MD;  [facility];  Service: Cardiovascular;  Laterality: N/A;  ? VIDEO BRONCHOSCOPY WITH ENDOBRONCHIAL ULTRASOUND N/A 01/12/2021  ? Procedure: VIDEO BRONCHOSCOPY WITH ENDOBRONCHIAL ULTRASOUND;  Surgeon: GTyler Pita MD;  Location: [facility] ORS;  Service: Cardiopulmonary;  Laterality: N/A;  ? ? ?SOCIAL HISTORY: ?Social History  ? ?Socioeconomic History  ? Marital status: Married  ?  Spouse name:  Deidre Ala  ? Number of children: Not on file  ? Years of education:  Not on file  ? Highest education level: 9th grade  ?Occupational History  ? Occupation: retired   ?Tobacco Use  ? Smoking status: Every Day  ?  Packs/day: 0.50  ?  Years: 50.00  ?  Pack years: 25.00  ?  Types:

## 2021-06-03 NOTE — Progress Notes (Signed)
Nutrition Assessment ? ? ?Reason for Assessment:  ? ?Dr Tasia Catchings, weight loss ? ? ?ASSESSMENT:  ?76 year old female being treated for lung cancer by Dr Tasia Catchings.  Past medical history of COPD, CAD, GERD, HLD, HTN, PVD, cervical cancer in 2020 with concurrent chemotherapy and radiation. Also found to have lung nodule and treated with SBRT then lost to follow-up.  Started treatment for lung cancer 12/27/20.  Patient receiving durvalumab.   ? ?Met with patient during infusion. Reports that her appetite has always been up and down and does not feel that cancer or treatment is effecting appetite.  Says that she does not have a taste for food, not hungry.  Says that she mainly snacks during the day (bugles with dip, fruit and cottage cheese, moonpies, cheese).  Drinks boost but not every day.   ? ? ?Medications: Ferrous sulfate, MVI, Vit B 12, prilosec, zofran ? ? ?Labs: Na 131, creatinine 1.08, ? ? ?Anthropometrics:  ? ?Height: 68 inches ?Weight: 136 lb ?141 lb on 4/5 ?136-141 lb UBW ?BMI: 20 ? ?4% weight loss in month, concerning ? ? ?NUTRITION DIAGNOSIS: Unintentional weight loss related to likely cancer and cancer related treatment as evidenced by 4% weight loss and decreased appetite ? ? ?INTERVENTION:  ?Discussed importance of good nutrition and weight maintenance during treatment ?Encouraged good sources of protein at each meal/snack.  Examples of protein foods provided to patient.  ?Encouraged boost plus shakes 1-2 times per day. Coupons given ?Contact information provided ? ? ?MONITORING, EVALUATION, GOAL:  ?Weight trends, intake ? ?Next Visit:phone call Thursday, June 1  ? ?Zoraida Havrilla B. Zenia Resides, RD, LDN ?Registered Dietitian ?[phone] ? ? ? ? ? ?

## 2021-06-03 NOTE — Patient Instructions (Signed)
[facility] CANCER CTR AT [place]  Discharge Instructions: ?Thank you for choosing [facility] to provide your oncology and hematology care.  ?If you have a lab appointment with the [facility], please go directly to the [facility] and check in at the registration area. ? ?Wear comfortable clothing and clothing appropriate for easy access to any Portacath or PICC line.  ? ?We strive to give you quality time with your provider. You may need to reschedule your appointment if you arrive late (15 or more minutes).  Arriving late affects you and other patients whose appointments are after yours.  Also, if you miss three or more appointments without notifying the office, you may be dismissed from the clinic at the provider?s discretion.    ?  ?For prescription refill requests, have your pharmacy contact our office and allow 72 hours for refills to be completed.   ? ?Today you received the following chemotherapy and/or immunotherapy agents IMFINZI    ?  ?To help prevent nausea and vomiting after your treatment, we encourage you to take your nausea medication as directed. ? ?BELOW ARE SYMPTOMS THAT SHOULD BE REPORTED IMMEDIATELY: ?*FEVER GREATER THAN 100.4 F (38 ?C) OR HIGHER ?*CHILLS OR SWEATING ?*NAUSEA AND VOMITING THAT IS NOT CONTROLLED WITH YOUR NAUSEA MEDICATION ?*UNUSUAL SHORTNESS OF BREATH ?*UNUSUAL BRUISING OR BLEEDING ?*URINARY PROBLEMS (pain or burning when urinating, or frequent urination) ?*BOWEL PROBLEMS (unusual diarrhea, constipation, pain near the anus) ?TENDERNESS IN MOUTH AND THROAT WITH OR WITHOUT PRESENCE OF ULCERS (sore throat, sores in mouth, or a toothache) ?UNUSUAL RASH, SWELLING OR PAIN  ?UNUSUAL VAGINAL DISCHARGE OR ITCHING  ? ?Items with * indicate a potential emergency and should be followed up as soon as possible or go to the Emergency Department if any problems should occur. ? ?Please show the CHEMOTHERAPY ALERT CARD or IMMUNOTHERAPY ALERT CARD at check-in to the  Emergency Department and triage nurse. ? ?Should you have questions after yoDurvalumab injection ?What is this medication? ?DURVALUMAB (dur VAL ue mab) is a monoclonal antibody. It is used to treat lung cancer. ?This medicine may be used for other purposes; ask your health care provider or pharmacist if you have questions. ?COMMON BRAND NAME(S): IMFINZI ?What should I tell my care team before I take this medication? ?They need to know if you have any of these conditions: ?autoimmune diseases like Crohn's disease, ulcerative colitis, or lupus ?have had or planning to have an allogeneic stem cell transplant (uses someone else's stem cells) ?history of organ transplant ?history of radiation to the chest ?nervous system problems like myasthenia gravis or Guillain-Barre syndrome ?an unusual or allergic reaction to durvalumab, other medicines, foods, dyes, or preservatives ?pregnant or trying to get pregnant ?breast-feeding ?How should I use this medication? ?This medicine is for infusion into a vein. It is given by a health care professional in a hospital or clinic setting. ?A special MedGuide will be given to you before each treatment. Be sure to read this information carefully each time. ?Talk to your pediatrician regarding the use of this medicine in children. Special care may be needed. ?Overdosage: If you think you have taken too much of this medicine contact a poison control center or emergency room at once. ?NOTE: This medicine is only for you. Do not share this medicine with others. ?What if I miss a dose? ?It is important not to miss your dose. Call your doctor or health care professional if you are unable to keep an appointment. ?What may interact  with this medication? ?Interactions have not been studied. ?This list may not describe all possible interactions. Give your health care provider a list of all the medicines, herbs, non-prescription drugs, or dietary supplements you use. Also tell them if you smoke,  drink alcohol, or use illegal drugs. Some items may interact with your medicine. ?What should I watch for while using this medication? ?This medication may make you feel generally unwell. Continue your course of treatment even though you feel ill unless your care team tells you to stop. ?You may need blood work done while you are taking this medication. ?Do not become pregnant while taking this medication or for 3 months after stopping it. Women should inform their care team if they wish to become pregnant or think they might be pregnant. There is a potential for serious side effects to an unborn child. Talk to your care team or pharmacist for more information. Do not breast-feed an infant while taking this medication or for 3 months after stopping it. ?What side effects may I notice from receiving this medication? ?Side effects that you should report to your care team as soon as possible: ?Allergic reactions--skin rash, itching, hives, swelling of the face, lips, tongue, or throat ?Bloody or watery diarrhea ?Dizziness, loss of balance or coordination, confusion or trouble speaking ?Dry cough, shortness of breath or trouble breathing ?Flushing, mostly over the face, neck, and chest, during injection ?High blood sugar (hyperglycemia)--increased thirst or amount of urine, unusual weakness or fatigue, blurry vision ?High thyroid levels (hyperthyroidism)--fast or irregular heartbeat, weight loss, excessive sweating or sensitivity to heat, tremors or shaking, anxiety, nervousness, irregular menstrual cycle or spotting ?Infection--fever, chills, cough, or sore throat ?Liver injury--right upper belly pain, loss of appetite, nausea, light-colored stool, dark yellow or brown urine, yellowing skin or eyes, unusual weakness or fatigue ?Low adrenal gland function--nausea, vomiting, loss of appetite, unusual weakness or fatigue, dizziness, low blood pressure ?Low thyroid levels (hypothyroidism)--unusual weakness or fatigue,  increased sensitivity to cold, constipation, hair loss, dry skin, weight gain, feelings of depression ?Pancreatitis--severe stomach pain that spreads to your back or gets worse after eating or when touched, fever, nausea, vomiting ?Rash, fever, and swollen lymph nodes ?Redness, blistering, peeling or loosening of the skin, including inside the mouth ?Wheezing--trouble breathing with loud or whistling sounds ?Side effects that usually do not require medical attention (report these to your care team if they continue or are bothersome): ?Fatigue ?Hair loss ?This list may not describe all possible side effects. Call your doctor for medical advice about side effects. You may report side effects to FDA at 1-800-FDA-1088. ?Where should I keep my medication? ?This medication is given in a hospital or clinic. It will not be stored at home. ?NOTE: This sheet is a summary. It may not cover all possible information. If you have questions about this medicine, talk to your doctor, pharmacist, or health care provider. ?? 2023 Elsevier/Gold Standard (2020-12-17 00:00:00) ?ur visit or need to cancel or reschedule your appointment, please contact [facility] CANCER [place]  [phone] and follow the prompts.  Office hours are 8:00 a.m. to 4:30 p.m. Monday - Friday. Please note that voicemails left after 4:00 p.m. may not be returned until the following business day.  We are closed weekends and major holidays. You have access to a nurse at all times for urgent questions. Please call the main number to the clinic [phone] and follow the prompts. ? ?For any non-urgent questions, you may also contact your provider using  MyChart. We now offer e-Visits for anyone 72 and older to request care online for non-urgent symptoms. For details visit [email or web address]. ?  ?Also download the MyChart app! Go to the app store, search "MyChart", open the app, select [place], and log in with your MyChart username and  password. ? ?Due to Covid, a mask is required upon entering the hospital/clinic. If you do not have a mask, one will be given to you upon arrival. For doctor visits, patients may have 1 support person aged 30

## 2021-06-08 ENCOUNTER — Inpatient Hospital Stay (HOSPITAL_BASED_OUTPATIENT_CLINIC_OR_DEPARTMENT_OTHER): Payer: Medicare PPO | Admitting: Obstetrics and Gynecology

## 2021-06-08 VITALS — BP 133/53 | HR 86 | Temp 98.7°F | Resp 20 | Wt 138.6 lb

## 2021-06-08 DIAGNOSIS — Z5112 Encounter for antineoplastic immunotherapy: Secondary | ICD-10-CM | POA: Diagnosis not present

## 2021-06-08 DIAGNOSIS — Z08 Encounter for follow-up examination after completed treatment for malignant neoplasm: Secondary | ICD-10-CM | POA: Diagnosis not present

## 2021-06-08 DIAGNOSIS — I700 Atherosclerosis of aorta: Secondary | ICD-10-CM | POA: Diagnosis not present

## 2021-06-08 DIAGNOSIS — R5383 Other fatigue: Secondary | ICD-10-CM | POA: Diagnosis not present

## 2021-06-08 DIAGNOSIS — D6481 Anemia due to antineoplastic chemotherapy: Secondary | ICD-10-CM | POA: Diagnosis not present

## 2021-06-08 DIAGNOSIS — Z923 Personal history of irradiation: Secondary | ICD-10-CM | POA: Diagnosis not present

## 2021-06-08 DIAGNOSIS — Z8541 Personal history of malignant neoplasm of cervix uteri: Secondary | ICD-10-CM | POA: Diagnosis not present

## 2021-06-08 DIAGNOSIS — R634 Abnormal weight loss: Secondary | ICD-10-CM | POA: Diagnosis not present

## 2021-06-08 DIAGNOSIS — C3411 Malignant neoplasm of upper lobe, right bronchus or lung: Secondary | ICD-10-CM | POA: Diagnosis not present

## 2021-06-08 NOTE — Progress Notes (Signed)
? ?[facility] ?[place]  ?[phone] [phone] ? ?Patient Care Team: ?[name], DO as PCP - General (Family Medicine) ?Clent Jacks, RN as Oncology Nurse Navigator ?Noreene Filbert, MD as Radiation Oncologist (Radiation Oncology) ?Telford Nab, RN as Sales executive ?[name], MD as Consulting Physician (Oncology)  ? ?Name of the patient: Mary Hoover  ?[id number]  ?01/13/1946  ? ?Date of visit: 06/08/2021 ? ?Gynecologic Oncology Interval Visit  ? ?Referring Provider: Dr. Livingston Diones Ob-Gyn ? ?Chief Complaint: Stage IIIC squamous Cell Cervical Cancer, surveillance ? ?Subjective:  ?Mary Hoover is a 76 y.o. female initially diagnosed with stage IIIc cervical squamous cell carcinoma s/p concurrent carboplatin (02/20/19-03/20/19) & radiation (02/17/2019- 03/24/19) followed by vaginal brachytherapy at [place] completed 04/11/2019 with history of NSCLC s/p radiation who returns to clinic for follow-up and continued surveillance. ? ?Patient's last pelvic exam was May 2022. No bleeding or other pelvic symptoms.  On maintenance immunotherapy for recurrent lung cancer.  ? ?# Lung Nodule/NSCLC- FDG avid on 11/27/19 PET. Reluctant to undergo surgery. Elected for empiric SBRT completed 2021.  ? ?11/20/2020, CT chest with contrast showed minimal residual of previously seen right upper lobe nodule.  0.6 x 0.4 cm.  Internal development of mild bandlike radiation fibrosis.  Newly enlarged pretracheal lymph node, measuring 2.5 x 1.7 cm.  Highly concerning for metastatic disease.  Unchanged prominent AP window and the right hilar lymph nodes.  Emphysema and diffuse bilateral bronchial wall thickening.  Coronary artery disease. ?12/13/2020, PET scan showed enlarging hypermetabolic right lower paratracheal lymph node, compatible with metastatic disease.  SUV 10.3.  Mild FDG uptake of the bilateral sacral alla with associated lucency.  Concerning for sacral  insufficiency fracture.  Stable posttreatment changes of the right upper lobe nodule.  Nonspecific small solid 3 mm pulmonary nodule of the left lower lobe.  2 small to be characterized.  No evidence of FDG avid metastatic disease in the abdomen or pelvis.  Aortic atherosclerosis and emphysema. ?  ?Patient reestablish care with Dr Tasia Catchings on 12/27/2020 for lung cancer treatments  ?  ?#01/12/2021 patient underwent bronchoscopy biopsy by Dr. Patsey Berthold ?Right paratracheal lymph node fine-needle aspiration is positive for metastatic non-small cell carcinoma, favor adenocarcinoma of lung origin. ?  ?Circulogen NGS negative PD-L1, ALK, ROS1, NTRK1/2/3,  ? ?# 02/14/2021-03/31/2021, concurrent carboplatin AUC 2 and taxol 86m/m2 with radiation. ?  ?04/26/2021, CT chest with contrast showed evolving postradiation changes in the posterior perifissural upper right lung without discrete residual measurable nodule in this location.  Right hilar/mediastinal lymphadenopathy stable to decreased.  Tiny 0.2 cm left lower lobe pulmonary nodule slightly decreased.  Positive response to treatment.  No new or progressive disease.  Three-vessel coronary arthrosclerosis.  Aortic atherosclerosis/emphysema. ?-concurrent chemotherapy and radiation-immunotherapy durvalumab maintenance. ?  ?#Chemo/radiation induced anemia, macrocytosis. ?Patient has normal vitamin B12, folate level.  Iron level showed adequate iron stores. ?  ? ?Gynecologic Oncology History  ?JSAYLAH KETNERis a pleasant y.o. female who is seen in consultation from Dr. JWynetta Emeryfor new diagnosis of probable cervical cancer. Patient initially presented to her PCP with postmenopausal vaginal bleeding.  [facility]  Last menstrual period around age 76  On exam, cervix was friable and firm to palpation extending anteriorly.  Urethral caruncle also noted.  Speculum exam caused some bleeding that required silver nitrate.  Pap and endometrial biopsy were  performed.  Endometrial biopsy returned some exudative material.  Findings are concerning for  neoplastic process. ? ?Pap- 12/19/2018 ?- Squamous cell Carcinoma ?- High Risk HPV - negative ? ?12/19/2018- Endometrial biopsy ?-  Scattered atypical squamous cells suspicious for malignancy  ?-  Predominantly necrosis with associated inflammation  ? ?CT Chest/Abdomen/Pelvis on 12/31/2018 for staging: Fluid distending the endometrial canal and/or endometrial thickening to the level of the cervix.  No adnexal mass or free fluid seen.  Haziness about the cervix of uncertain significance.  Chronic pleuroparenchymal scarring/fibrosis at lung apices, irregular pulmonary nodule within the Right upper lobe measuring 66m, moderate emphysema.  ? ?01/08/2019 Cervical biopsy. ?DIAGNOSIS:  ?A. CERVIX; BIOPSY:  ?- AT LEAST HIGH-GRADE SQUAMOUS INTRAEPITHELIAL LESION (HSIL/CIN3).  ?Comment: Invasion is not definitively identified in this sample.  Numerous step sections are examined.  Given the exophytic growth pattern of the lesion, an underlying invasive component cannot be excluded. The diagnosis of squamous cell carcinoma on recent pap smear is noted. Immunohistochemical stain for p16 is negative.  ? ?01/08/2019 PET ?IMPRESSION:1. Marked hypermetabolism in the region of the cervix, compatible with the reported history of cervical cancer. ?2. Small bilateral pelvic sidewall lymph nodes show discernible FDG accumulation, concerning for metastatic disease although neither lymph node is enlarged by CT size criteria. ?3. 7 mm nodule in the right upper lobe shows discernible FDG accumulation. Given the discernible uptake in a tiny lung nodule of this size, neoplasm is a distinct consideration. Primary bronchogenic carcinoma or metastatic disease could have this appearance. ?4.  Aortic Atherosclerois (ICD10-170.0) ?5.  Emphysema. (ICD10-J43.9) ? ?Stage IIIc cervical squamous cell carcinoma s/p concurrent carboplatin (02/20/19-03/20/19) &  radiation (02/17/2019- 03/24/19) [place] [facility] [facility] [place] who returns to clinic for follow-up.  ? ?04/22/2019- CT Chest WO Contrast revealed stable right upper lobe lung nodule measuring 75m previously given the discernible uptake on PET from 01/08/2019, in a tiny lung nodule of this size, neoplasm is a distinct consideration. Primary bronchogenic carcinoma or metastatic disease could have this appearance. Recommend continued serial follow up to ensure stability.  ? ?11/26/20 PET/CT for persistent right lung nodule.  Pelvis and abdomen were negative.  ? ?IMPRESSION: ?1. Since the previous PET-CT there has been interval increase in size and degree of FDG uptake associated with the perifissural right upper lobe lung nodule which is suspicious for malignancy. Given the smoking related changes within the lungs findings may represent a primary bronchogenic carcinoma. Metastatic disease is less favored but not excluded. ?2. Mild increased FDG uptake associated with bilateral hilar lymph nodes and left AP window lymph node. This is equivocal for nodal metastasis. ?3. No signs of FDG avid solid organ or nodal metastasis within the abdomen or pelvis. ? ?Status post SBRT 12/21 to right upper lobe for presumed stage I lung non-small cell lung cancer with Dr ChBaruch GoutyShe declined surgery.  ? ?06/23/202 CT Chest  ?IMPRESSION: ?1. Spiculated right upper lobe nodule shows slight growth from 12/31/2018 and is new from 02/02/2015. Together with visualization on PET 01/08/2019, findings are worrisome for adenocarcinoma. ?2. Aortic atherosclerosis (ICD10-I70.0). Coronary artery calcification. ?3.  Emphysema (ICD10-J43.9). ? ?Saw Dr OaFaith Rogue/21 and he discussed options including surveillance vs resection. She is reluctant to have surgery because half a lung would have to be removed and she is already having significant dyspnea due to COPD.  Has reduced smoking to half PPD. Dr ChBaruch Goutyuggests another  CT in 3 months and if further growth he can treat this small area with radiation. ? ?Colonoscopy: 2018 ?Mammogram: 01/26/21- diagnostic mammogram was recommended which she  hasn't had.  ? ?Also s/p carotid end artere

## 2021-06-17 ENCOUNTER — Encounter: Payer: Self-pay | Admitting: Oncology

## 2021-06-17 ENCOUNTER — Inpatient Hospital Stay: Payer: Medicare PPO

## 2021-06-17 ENCOUNTER — Inpatient Hospital Stay (HOSPITAL_BASED_OUTPATIENT_CLINIC_OR_DEPARTMENT_OTHER): Payer: Medicare PPO | Admitting: Oncology

## 2021-06-17 VITALS — BP 169/66 | HR 84 | Resp 18

## 2021-06-17 VITALS — BP 154/68 | HR 86 | Temp 96.0°F | Ht 68.0 in | Wt 136.0 lb

## 2021-06-17 DIAGNOSIS — Z923 Personal history of irradiation: Secondary | ICD-10-CM | POA: Diagnosis not present

## 2021-06-17 DIAGNOSIS — Z8541 Personal history of malignant neoplasm of cervix uteri: Secondary | ICD-10-CM | POA: Diagnosis not present

## 2021-06-17 DIAGNOSIS — R634 Abnormal weight loss: Secondary | ICD-10-CM

## 2021-06-17 DIAGNOSIS — C3411 Malignant neoplasm of upper lobe, right bronchus or lung: Secondary | ICD-10-CM | POA: Diagnosis not present

## 2021-06-17 DIAGNOSIS — T451X5A Adverse effect of antineoplastic and immunosuppressive drugs, initial encounter: Secondary | ICD-10-CM

## 2021-06-17 DIAGNOSIS — Z5111 Encounter for antineoplastic chemotherapy: Secondary | ICD-10-CM

## 2021-06-17 DIAGNOSIS — C3491 Malignant neoplasm of unspecified part of right bronchus or lung: Secondary | ICD-10-CM

## 2021-06-17 DIAGNOSIS — I700 Atherosclerosis of aorta: Secondary | ICD-10-CM | POA: Diagnosis not present

## 2021-06-17 DIAGNOSIS — Z5112 Encounter for antineoplastic immunotherapy: Secondary | ICD-10-CM | POA: Diagnosis not present

## 2021-06-17 DIAGNOSIS — D6481 Anemia due to antineoplastic chemotherapy: Secondary | ICD-10-CM

## 2021-06-17 DIAGNOSIS — R5383 Other fatigue: Secondary | ICD-10-CM | POA: Diagnosis not present

## 2021-06-17 DIAGNOSIS — C539 Malignant neoplasm of cervix uteri, unspecified: Secondary | ICD-10-CM

## 2021-06-17 LAB — CBC WITH DIFFERENTIAL/PLATELET
Abs Immature Granulocytes: 0.03 10*3/uL (ref 0.00–0.07)
Basophils Absolute: 0 10*3/uL (ref 0.0–0.1)
Basophils Relative: 1 %
Eosinophils Absolute: 0.4 10*3/uL (ref 0.0–0.5)
Eosinophils Relative: 6 %
HCT: 31.1 % — ABNORMAL LOW (ref 36.0–46.0)
Hemoglobin: 10.6 g/dL — ABNORMAL LOW (ref 12.0–15.0)
Immature Granulocytes: 1 %
Lymphocytes Relative: 9 %
Lymphs Abs: 0.5 10*3/uL — ABNORMAL LOW (ref 0.7–4.0)
MCH: 33.9 pg (ref 26.0–34.0)
MCHC: 34.1 g/dL (ref 30.0–36.0)
MCV: 99.4 fL (ref 80.0–100.0)
Monocytes Absolute: 0.5 10*3/uL (ref 0.1–1.0)
Monocytes Relative: 8 %
Neutro Abs: 4.7 10*3/uL (ref 1.7–7.7)
Neutrophils Relative %: 75 %
Platelets: 269 10*3/uL (ref 150–400)
RBC: 3.13 MIL/uL — ABNORMAL LOW (ref 3.87–5.11)
RDW: 11.8 % (ref 11.5–15.5)
WBC: 6.2 10*3/uL (ref 4.0–10.5)
nRBC: 0 % (ref 0.0–0.2)

## 2021-06-17 LAB — COMPREHENSIVE METABOLIC PANEL
ALT: 11 U/L (ref 0–44)
AST: 17 U/L (ref 15–41)
Albumin: 3.6 g/dL (ref 3.5–5.0)
Alkaline Phosphatase: 59 U/L (ref 38–126)
Anion gap: 6 (ref 5–15)
BUN: 20 mg/dL (ref 8–23)
CO2: 23 mmol/L (ref 22–32)
Calcium: 8.8 mg/dL — ABNORMAL LOW (ref 8.9–10.3)
Chloride: 103 mmol/L (ref 98–111)
Creatinine, Ser: 0.96 mg/dL (ref 0.44–1.00)
GFR, Estimated: >60 mL/min (ref >60)
Glucose, Bld: 108 mg/dL — ABNORMAL HIGH (ref 70–99)
Potassium: 4.5 mmol/L (ref 3.5–5.1)
Sodium: 132 mmol/L — ABNORMAL LOW (ref 135–145)
Total Bilirubin: 0.8 mg/dL (ref 0.3–1.2)
Total Protein: 6.6 g/dL (ref 6.5–8.1)

## 2021-06-17 LAB — IGP, APTIMA HPV: HPV Aptima: NEGATIVE

## 2021-06-17 MED ORDER — SODIUM CHLORIDE 0.9 % IV SOLN
10.0000 mg/kg | Freq: Once | INTRAVENOUS | Status: AC
  Administered 2021-06-17: 620 mg via INTRAVENOUS
  Filled 2021-06-17: qty 10

## 2021-06-17 MED ORDER — HEPARIN SOD (PORK) LOCK FLUSH 100 UNIT/ML IV SOLN
500.0000 [IU] | Freq: Once | INTRAVENOUS | Status: AC | PRN
  Administered 2021-06-17: 500 [IU]
  Filled 2021-06-17: qty 5

## 2021-06-17 MED ORDER — SODIUM CHLORIDE 0.9 % IV SOLN
Freq: Once | INTRAVENOUS | Status: AC
  Filled 2021-06-17: qty 250

## 2021-06-17 NOTE — Patient Instructions (Signed)
[facility] CANCER CTR AT [place]  Discharge Instructions: Thank you for choosing [facility] to provide your oncology and hematology care.  If you have a lab appointment with the [facility], please go directly to the [facility] and check in at the registration area.  Wear comfortable clothing and clothing appropriate for easy access to any Portacath or PICC line.   We strive to give you quality time with your provider. You may need to reschedule your appointment if you arrive late (15 or more minutes).  Arriving late affects you and other patients whose appointments are after yours.  Also, if you miss three or more appointments without notifying the office, you may be dismissed from the clinic at the provider's discretion.      For prescription refill requests, have your pharmacy contact our office and allow 72 hours for refills to be completed.    Today you received the following chemotherapy and/or immunotherapy agents Durvalumab      To help prevent nausea and vomiting after your treatment, we encourage you to take your nausea medication as directed.  BELOW ARE SYMPTOMS THAT SHOULD BE REPORTED IMMEDIATELY: *FEVER GREATER THAN 100.4 F (38 C) OR HIGHER *CHILLS OR SWEATING *NAUSEA AND VOMITING THAT IS NOT CONTROLLED WITH YOUR NAUSEA MEDICATION *UNUSUAL SHORTNESS OF BREATH *UNUSUAL BRUISING OR BLEEDING *URINARY PROBLEMS (pain or burning when urinating, or frequent urination) *BOWEL PROBLEMS (unusual diarrhea, constipation, pain near the anus) TENDERNESS IN MOUTH AND THROAT WITH OR WITHOUT PRESENCE OF ULCERS (sore throat, sores in mouth, or a toothache) UNUSUAL RASH, SWELLING OR PAIN  UNUSUAL VAGINAL DISCHARGE OR ITCHING   Items with * indicate a potential emergency and should be followed up as soon as possible or go to the Emergency Department if any problems should occur.  Please show the CHEMOTHERAPY ALERT CARD or IMMUNOTHERAPY ALERT CARD at check-in to  the Emergency Department and triage nurse.  Should you have questions after your visit or need to cancel or reschedule your appointment, please contact [facility] CANCER [place] AT [place]  [phone] and follow the prompts.  Office hours are 8:00 a.m. to 4:30 p.m. Monday - Friday. Please note that voicemails left after 4:00 p.m. may not be returned until the following business day.  We are closed weekends and major holidays. You have access to a nurse at all times for urgent questions. Please call the main number to the clinic [phone] and follow the prompts.  For any non-urgent questions, you may also contact your provider using MyChart. We now offer e-Visits for anyone 74 and older to request care online for non-urgent symptoms. For details visit [email or web address].   Also download the MyChart app! Go to the app store, search "MyChart", open the app, select [place], and log in with your MyChart username and password.  Due to Covid, a mask is required upon entering the hospital/clinic. If you do not have a mask, one will be given to you upon arrival. For doctor visits, patients may have 1 support person aged 74 or older with them. For treatment visits, patients cannot have anyone with them due to current Covid guidelines and our immunocompromised population.

## 2021-06-17 NOTE — Progress Notes (Signed)
Hematology/Oncology Progress note [phone] [phone]      Patient Care Team: Mary Roys, DO as PCP - General (Family Medicine) Clent Jacks, RN as Oncology Nurse Navigator Noreene Filbert, MD as Radiation Oncologist (Radiation Oncology) Telford Nab, RN as Oncology Nurse Navigator [name], MD as Consulting Physician (Oncology)  REFERRING PROVIDER: [name], DO  CHIEF COMPLAINTS/REASON FOR VISIT:  Follow up lung cancer treatments  HISTORY OF PRESENTING ILLNESS:  # Cervix cancer Patient has developed postmenopausal vaginal bleeding and discharge.  She was noted to have a friable cervix/2 cm mass of the cervix, concerning for malignancy 12/19/2018 endometrial biopsy showed scattered atypical squamous cells suspicious for malignancy.  Predominantly necrosis with associated inflammation. 01/01/2019 initial cervix biopsy showed at least high-grade squamous intraepithelial lesion.-HPV negative 01/22/2019, repeat vaginal wall biopsy and cervix 9:00 biopsy showed squamous cell carcinoma.    Staging images 12/31/2018 showed fluid distending the endometrial canal and or endometrial thickening to the level of cervix.  No evidence of pathological lymphadenopathy.  Haziness about the lower cervix.  7 mm irregular pulmonary nodule within the right upper lobe, suspicious for possible primary or metastatic malignancy.  Chronic pleural-parenchymal scarring/fibrosis at the lung apices.  Large amount of stool.  No bowel obstruction. 01/08/2019 PET scan showed marked hypermetabolism in the region of the cervix, compatible with the reported history of cervical cancer. Small bilateral pelvic sidewall lymph nodes discernible FDG accumulation, concerning for metastatic disease although neither lymph node is enlarged by CT size criteria. 7 mm nodule in the right upper lobe shows discernible FDG accumulation.  Questionable neoplasm, primary versus  metastatic. Emphysema.   #Chronic hearing loss  # Lung nodule, FDG avid, questionable primary bronchogenic carcinoma versus metastatic cancer.Discussed with radiation oncology.  Her case was also discussed on tumor board.  Consensus reached on finishing concurrent chemoradiation treatments for cervix cancer first. Possible SBRT to lung nodule for presumed primary lung cancer.  # 02/20/2020 - 03/19/2020 concurrent chemotherapy weekly carboplatin and taxol and radiation for treatment of this cervix cancer.   #December 2021 lung nodule,was evaluated by Dr.Oaks. Options of biopsy via transthoracic or endobronchial approach vs surgical resection.  Patient opted to empiric SBRT  Patient was last seen by me on 06/25/2019.  Lost follow-up. She continues to follow with radiation oncology. 11/20/2020, CT chest with contrast showed minimal residual of previously seen right upper lobe nodule.  0.6 x 0.4 cm.  Internal development of mild bandlike radiation fibrosis.  Newly enlarged pretracheal lymph node, measuring 2.5 x 1.7 cm.  Highly concerning for metastatic disease.  Unchanged prominent AP window and the right hilar lymph nodes.  Emphysema and diffuse bilateral bronchial wall thickening.  Coronary artery disease. 12/13/2020, PET scan showed enlarging hypermetabolic right lower paratracheal lymph node, compatible with metastatic disease.  SUV 10.3.  Mild FDG uptake of the bilateral sacral alla with associated lucency.  Concerning for sacral insufficiency fracture.  Stable posttreatment changes of the right upper lobe nodule.  Nonspecific small solid 3 mm pulmonary nodule of the left lower lobe.  2 small to be characterized.  No evidence of FDG avid metastatic disease in the abdomen or pelvis.  Aortic atherosclerosis and emphysema.  Patient reestablish care on 12/27/2020 for lung cancer treatments   #01/12/2021 patient underwent bronchoscopy biopsy by Dr. Patsey Berthold Right paratracheal lymph node fine-needle  aspiration is positive for metastatic non-small cell carcinoma, favor adenocarcinoma of lung origin.  Circulogen NGS negative PD-L1, ALK, ROS1, NTRK1/2/3,   # 02/14/2021-03/31/2021, concurrent carboplatin  AUC 2 and taxol 79m/m2 with radiation.  04/26/2021, CT chest with contrast showed evolving postradiation changes in the posterior perifissural upper right lung without discrete residual measurable nodule in this location.  Right hilar/mediastinal lymphadenopathy stable to decreased.  Tiny 0.2 cm left lower lobe pulmonary nodule slightly decreased.  Positive response to treatment.  No new or progressive disease.  Three-vessel coronary arthrosclerosis.  Aortic atherosclerosis/emphysema. INTERVAL HISTORY Mary Hoover a 76y.o. female who has above history reviewed by me today presents for follow up visit for management of possible recurrent lung cancer.  History of cervix cancer.   Patient reports feeling well today.  Appetite is fair.  Food does not taste good.  Denies any nausea vomiting diarrhea, shortness of breath.  Review of Systems  Constitutional:  Positive for fatigue and unexpected weight change. Negative for appetite change, chills and fever.  HENT:   Positive for hearing loss. Negative for voice change.   Eyes:  Negative for eye problems.  Respiratory:  Negative for chest tightness and cough.   Cardiovascular:  Negative for chest pain.  Gastrointestinal:  Negative for abdominal distention, abdominal pain and blood in stool.  Endocrine: Negative for hot flashes.  Genitourinary:  Negative for difficulty urinating and frequency.   Musculoskeletal:  Negative for arthralgias.  Skin:  Negative for itching and rash.  Neurological:  Negative for extremity weakness.  Hematological:  Negative for adenopathy.  Psychiatric/Behavioral:  Negative for confusion.    MEDICAL HISTORY:  Past Medical History:  Diagnosis Date   Arthritis    Cancer ([place]    Carotid atherosclerosis    Carotid  bruit    Cervical cancer (HCC)    COPD (chronic obstructive pulmonary disease) (HCC)    emphysema   Coronary artery disease    Family history of adverse reaction to anesthesia    paternal grandfather died during surgery-pt unaware what happened   GERD (gastroesophageal reflux disease)    Hyperlipidemia    Hypertension    Hyponatremia 02/27/2019   Hypothyroidism    Infected cat bite 1980s   was hospitalized   Irregular heartbeat    Medical history non-contributory    Peripheral vascular disease ([place]    Tobacco use     SURGICAL HISTORY: Past Surgical History:  Procedure Laterality Date   CAROTID ENDARTERECTOMY Right Oct. 2015   Dr. DLucky Cowboy  CAROTID STENOSIS Left April 2016   carotid stenosis surgery   COLONOSCOPY WITH PROPOFOL N/A 11/08/2016   Procedure: COLONOSCOPY WITH PROPOFOL;  Surgeon: AJonathon Bellows MD;  [facility];  Service: Gastroenterology;  Laterality: N/A;   ESOPHAGOGASTRODUODENOSCOPY (EGD) WITH PROPOFOL N/A 11/08/2016   Procedure: ESOPHAGOGASTRODUODENOSCOPY (EGD) WITH PROPOFOL;  Surgeon: AJonathon Bellows MD;  [facility];  Service: Gastroenterology;  Laterality: N/A;   FLEXIBLE BRONCHOSCOPY N/A 12/17/2014   Procedure: FLEXIBLE BRONCHOSCOPY;  Surgeon: DWilhelmina Mcardle MD;  Location: [facility] ORS;  Service: Pulmonary;  Laterality: N/A;   INCISION AND DRAINAGE / EXCISION THYROGLOSSAL CYST  April 2011   PORTA CATH INSERTION N/A 03/03/2021   Procedure: PORTA CATH INSERTION;  Surgeon: DAlgernon Huxley MD;  [facility];  Service: Cardiovascular;  Laterality: N/A;   VIDEO BRONCHOSCOPY WITH ENDOBRONCHIAL ULTRASOUND N/A 01/12/2021   Procedure: VIDEO BRONCHOSCOPY WITH ENDOBRONCHIAL ULTRASOUND;  Surgeon: GTyler Pita MD;  Location: [facility] ORS;  Service: Cardiopulmonary;  Laterality: N/A;    SOCIAL HISTORY: Social History   Socioeconomic History   Marital status: Married    Spouse name: RDeidre Ala  Number of children: Not on file   Years of  education: Not on file   Highest education level: 9th grade  Occupational History   Occupation: retired   Tobacco Use   Smoking status: Every Day    Packs/day: 0.50    Years: 50.00    Pack years: 25.00    Types: Cigarettes   Smokeless tobacco: Never   Tobacco comments:    0.5 PPD 02/10/2021  Vaping Use   Vaping Use: Former  Substance and Sexual Activity   Alcohol use: No    Alcohol/week: 0.0 standard drinks   Drug use: No   Sexual activity: Yes  Other Topics Concern   Not on file  Social History Narrative   Lives at home with husband   Social Determinants of Health   Financial Resource Strain: Low Risk    Difficulty of Paying Living Expenses: Not hard at all  Food Insecurity: No Food Insecurity   Worried About Charity fundraiser in the Last Year: Never true   [place] in the Last Year: Never true  Transportation Needs: No Transportation Needs   Lack of Transportation (Medical): No   Lack of Transportation (Non-Medical): No  Physical Activity: Inactive   Days of Exercise per Week: 0 days   Minutes of Exercise per Session: 0 min  Stress: No Stress Concern Present   Feeling of Stress : Not at all  Social Connections: Moderately Isolated   Frequency of Communication with Friends and Family: More than three times a week   Frequency of Social Gatherings with Friends and Family: More than three times a week   Attends Religious Services: Never   Marine scientist or Organizations: No   Attends Music therapist: Never   Marital Status: Married  Human resources officer Violence: Not At Risk   Fear of Current or Ex-Partner: No   Emotionally Abused: No   Physically Abused: No   Sexually Abused: No    FAMILY HISTORY: Family History  Problem Relation Age of Onset   Congestive Heart Failure Mother    Heart disease Mother    Hypertension Mother    Hypertension Sister    Diabetes Sister    Heart disease Sister    Heart disease Maternal Uncle    Heart  disease Maternal Grandmother    Stroke Maternal Grandfather    Cancer Brother        liver, lung   Heart disease Brother    Hypertension Brother    Heart attack Brother    COPD Neg Hx    Breast cancer Neg Hx     ALLERGIES:  is allergic to atorvastatin.  MEDICATIONS:  Current Outpatient Medications  Medication Sig Dispense Refill   acetaminophen (TYLENOL) 500 MG tablet Take 500 mg by mouth every 6 (six) hours as needed for moderate pain.     alendronate (FOSAMAX) 70 MG tablet Take 1 tablet (70 mg total) by mouth every 7 (seven) days. Take with a full glass of water on an empty stomach. 12 tablet 3   chlorhexidine (PERIDEX) 0.12 % solution Use as directed 5 mLs in the mouth or throat 2 (two) times daily. 120 mL 0   clopidogrel (PLAVIX) 75 MG tablet Take 1 tablet (75 mg total) by mouth daily. 90 tablet 3   cyclobenzaprine (FLEXERIL) 10 MG tablet TAKE 1 TABLET AT BEDTIME 60 tablet 3   ferrous sulfate 325 (65 FE) MG EC tablet Take 1 tablet (325 mg total) by  mouth daily with breakfast. 90 tablet 4   Fluticasone-Umeclidin-Vilant (TRELEGY ELLIPTA) 100-62.5-25 MCG/INH AEPB Inhale 1 puff into the lungs daily. 1 each 11   levothyroxine (SYNTHROID) 88 MCG tablet Take 1 tablet (88 mcg total) by mouth daily. 90 tablet 3   lidocaine (LIDODERM) 5 % Place 1 patch onto the skin daily. Remove & Discard patch within 12 hours or as directed by MD 90 patch 1   lisinopril (ZESTRIL) 20 MG tablet Take 1 tablet (20 mg total) by mouth daily. (Patient taking differently: Take 20 mg by mouth.) 90 tablet 1   Multiple Vitamin (MULTIVITAMIN) tablet Take 1 tablet by mouth daily.     naproxen (NAPROSYN) 500 MG tablet TAKE 1 TABLET TWICE DAILY WITH MEALS 180 tablet 1   omeprazole (PRILOSEC) 20 MG capsule TAKE 1 CAPSULE EVERY DAY 90 capsule 2   vitamin B-12 (CYANOCOBALAMIN) 500 MCG tablet Take 500 mcg by mouth daily.     gabapentin (NEURONTIN) 100 MG capsule Take 1 capsule in the morning and afternoon, and 3 capsules at  bedtime (Patient not taking: Reported on 06/08/2021) 450 capsule 1   lidocaine-prilocaine (EMLA) cream Apply 1 application topically as needed. (Patient not taking: Reported on 06/03/2021) 60 g 3   ondansetron (ZOFRAN) 8 MG tablet Take 1 tablet (8 mg total) by mouth 2 (two) times daily as needed for refractory nausea / vomiting. Start on day 3 after chemo. (Patient not taking: Reported on 06/03/2021) 30 tablet 1   No current facility-administered medications for this visit.     PHYSICAL EXAMINATION: ECOG PERFORMANCE STATUS: 1 - Symptomatic but completely ambulatory Vitals:   06/17/21 1001  BP: (!) 154/68  Pulse: 86  Temp: (!) 96 F (35.6 C)   Filed Weights   06/17/21 1001  Weight: 136 lb (61.7 kg)    Physical Exam Constitutional:      General: She is not in acute distress. HENT:     Head: Normocephalic and atraumatic.  Eyes:     General: No scleral icterus.    Pupils: Pupils are equal, round, and reactive to light.  Cardiovascular:     Rate and Rhythm: Normal rate and regular rhythm.     Heart sounds: Normal heart sounds.  Pulmonary:     Effort: Pulmonary effort is normal. No respiratory distress.     Breath sounds: No wheezing.     Comments: Decreased breath sound bilaterally  Abdominal:     General: Bowel sounds are normal. There is no distension.     Palpations: Abdomen is soft. There is no mass.     Tenderness: There is no abdominal tenderness.  Musculoskeletal:        General: No deformity. Normal range of motion.     Cervical back: Normal range of motion and neck supple.     Comments: Trace edema bilaterally  Skin:    General: Skin is warm and dry.     Findings: No erythema or rash.  Neurological:     Mental Status: She is alert and oriented to person, place, and time. Mental status is at baseline.     Cranial Nerves: No cranial nerve deficit.     Coordination: Coordination normal.  Psychiatric:        Mood and Affect: Mood normal.    LABORATORY DATA:  I have  reviewed the data as listed Lab Results  Component Value Date   WBC 6.2 06/17/2021   HGB 10.6 (L) 06/17/2021   HCT 31.1 (L) 06/17/2021   MCV  99.4 06/17/2021   PLT 269 06/17/2021   Recent Labs    05/19/21 0847 06/03/21 0832 06/17/21 0947  NA 134* 131* 132*  K 3.8 4.4 4.5  CL 103 104 103  CO2 _0 GLUCOSE 127* 93 108*  BUN _1 CREATININE 1.04* 1.08* 0.96  CALCIUM 9.1 8.5* 8.8*  GFRNONAA 56* 54* >60  PROT 6.6 6.5 6.6  ALBUMIN 3.5 3.5 3.6  AST _2 ALT _3 ALKPHOS 60 59 59  BILITOT 0.4 0.7 0.8    Iron/TIBC/Ferritin/ %Sat    Component Value Date/Time   IRON 90 05/04/2021 0929   TIBC 321 05/04/2021 0929   FERRITIN 52 05/04/2021 0929   IRONPCTSAT 28 05/04/2021 0929      RADIOGRAPHIC STUDIES: I have personally reviewed the radiological images as listed and agreed with the findings in the report. DG Chest 2 View  Result Date: 03/30/2021 CLINICAL DATA:  Patient with upper back pain. EXAM: CHEST - 2 VIEW COMPARISON:  Chest radiograph 03/07/2016. FINDINGS: Left anterior chest wall Port-A-Cath is present with tip projecting over the superior vena cava. Stable cardiac and mediastinal contours. Spiculated nodular density within the right upper hemithorax. No pleural effusion or pneumothorax. Thoracic spine degenerative changes. IMPRESSION: Spiculated nodular density within the right upper hemithorax. This may represent posttreatment changes. Recommend attention on follow-up oncologic imaging. Electronically Signed   By: Lovey Newcomer M.D.   On: 03/30/2021 21:36   DG Thoracic Spine W/Swimmers  Result Date: 03/31/2021 CLINICAL DATA:  Upper back pain. EXAM: THORACIC SPINE - 3 VIEWS COMPARISON:  CT chest 11/18/2020; X-ray chest 01/15/2010. FINDINGS: Partially visualized catheter with tip at the cavoatrial junction. There is no acute fracture or subluxation of the thoracic spine. The bones are osteopenic. Degenerative changes with disc space narrowing and spurring. The  soft tissues are unremarkable. IMPRESSION: No acute/traumatic thoracic spine pathology. Electronically Signed   By: Anner Crete M.D.   On: 03/31/2021 01:23   CT Chest W Contrast  Result Date: 04/26/2021 CLINICAL DATA:  Non-small cell right lung cancer status post radiation therapy. Restaging. Additional history of cervical cancer. * Tracking Code: BO * EXAM: CT CHEST WITH CONTRAST TECHNIQUE: Multidetector CT imaging of the chest was performed during intravenous contrast administration. RADIATION DOSE REDUCTION: This exam was performed according to the departmental dose-optimization program which includes automated exposure control, adjustment of the mA and/or kV according to patient size and/or use of iterative reconstruction technique. CONTRAST:  36m OMNIPAQUE IOHEXOL 300 MG/ML  SOLN COMPARISON:  12/13/2020 PET-CT.  11/18/2020 chest CT. FINDINGS: Cardiovascular: Normal heart size. No significant pericardial effusion/thickening. Three-vessel coronary atherosclerosis. Left internal jugular Port-A-Cath terminates at the cavoatrial junction. Atherosclerotic nonaneurysmal thoracic aorta. Normal caliber pulmonary arteries. No central pulmonary emboli. Mediastinum/Nodes: No discrete thyroid nodules. Unremarkable esophagus. No axillary adenopathy. Previously enlarged 1.6 cm short axis right paratracheal node is decreased to 0.6 cm (series 2/image 54). Previously enlarged 1.0 cm AP window node is decreased to 0.8 cm (series 2/image 59). Previously enlarged 1.0 cm right hilar node is stable (series 2/image 66). No new pathologically enlarged mediastinal or hilar nodes. Lungs/Pleura: No pneumothorax. No pleural effusion. Severe centrilobular emphysema with mild diffuse bronchial wall thickening. Tiny posterior left lower lobe solid 0.2 cm pulmonary nodule (series 3/image 101), slightly decreased from 0.3 cm on 11/18/2020 chest CT. New triangular subpleural opacity in the posterior right lower lobe (series 3/image  100), favor focal atelectasis. Sharply marginated patchy consolidation, reticulation and ground-glass opacity  in the posterior perifissural upper right lung (series 3/image 54), increased, compatible with evolving post radiation change, without discrete residual measurable nodule in this location. No new significant pulmonary nodules. Upper abdomen: No acute abnormality. Musculoskeletal: No aggressive appearing focal osseous lesions. Moderate thoracic spondylosis. IMPRESSION: 1. Evolving post radiation change in the posterior perifissural upper right lung, without discrete residual measurable nodule in this location. 2. Mild right hilar and mediastinal lymphadenopathy is stable to decreased. Tiny 0.2 cm left lower lobe pulmonary nodule is slightly decreased in size. Findings are compatible with positive response to therapy. 3. No new or progressive metastatic disease in the chest. 4. Three-vessel coronary atherosclerosis. 5. Aortic Atherosclerosis (ICD10-I70.0) and Emphysema (ICD10-J43.9). Electronically Signed   By: Ilona Sorrel M.D.   On: 04/26/2021 14:30       ASSESSMENT & PLAN:  1. Primary adenocarcinoma of right lung ([place])   2. Encounter for antineoplastic immunotherapy   3. Anemia due to antineoplastic chemotherapy   4. Encounter for antineoplastic chemotherapy   5. Malignant neoplasm of cervix, unspecified site [facility])    Cancer Staging  Cervical cancer ([place]) Staging form: Cervix Uteri, AJCC Version 9 - Clinical: FIGO Stage IIICr (cTX, cN1) - Unsigned  Primary lung adenocarcinoma ([place]) Staging form: Lung, AJCC 7th Edition - Clinical stage from 01/12/2021: T1, N1, M0 - Signed by [name], MD on 01/29/2021   #Stage III right lung adenocarcinoma, recurrent  -concurrent chemotherapy and radiation-immunotherapy durvalumab maintenance Labs reviewed and discussed with patient Proceed with Durvalumab treatments. Plan repeat CT chest surveillance scan at the end of June.  #Chemo/radiation  induced anemia, macrocytosis. Patient has normal vitamin B12, folate level.  Iron level showed adequate iron stores. Microcytosis is getting better.  # Locally advanced cervical cancer stage IIIC1 with pelvic nodes involvement. S/p concurrent chemotherapy and radiation followed by brachytherapy.  She is not actively following up with gynecology oncology.  I recommend patient to reestablish care.  # Hypocalcemia, on Fosamax.  Continue calcium supplementation 1268m daily.- otc supply.     #Weight loss, follow-up with nutritionist.  Encourage nutrition supplements. All questions were answered. The patient knows to call the clinic with any problems questions or concerns.   Return of visit:   2 weeks lab MD Durvalumab.    ZEarlie Server MD, PhD 06/17/2021

## 2021-06-20 ENCOUNTER — Telehealth: Payer: Self-pay

## 2021-06-20 NOTE — Telephone Encounter (Signed)
-----   Message from Verlon Au, NP sent at 06/20/2021  2:33 PM EDT ----- Please let patient know that pap was normal.   ----- Message ----- From: Interface, [facility] Lab Results In Sent: 06/17/2021   4:37 PM EDT To: Verlon Au, NP

## 2021-06-30 ENCOUNTER — Inpatient Hospital Stay: Payer: Medicare PPO | Attending: Oncology

## 2021-06-30 DIAGNOSIS — Z923 Personal history of irradiation: Secondary | ICD-10-CM | POA: Insufficient documentation

## 2021-06-30 DIAGNOSIS — Z5112 Encounter for antineoplastic immunotherapy: Secondary | ICD-10-CM | POA: Insufficient documentation

## 2021-06-30 DIAGNOSIS — Z9221 Personal history of antineoplastic chemotherapy: Secondary | ICD-10-CM | POA: Insufficient documentation

## 2021-06-30 DIAGNOSIS — C3491 Malignant neoplasm of unspecified part of right bronchus or lung: Secondary | ICD-10-CM | POA: Insufficient documentation

## 2021-06-30 DIAGNOSIS — Z79899 Other long term (current) drug therapy: Secondary | ICD-10-CM | POA: Insufficient documentation

## 2021-06-30 DIAGNOSIS — C539 Malignant neoplasm of cervix uteri, unspecified: Secondary | ICD-10-CM | POA: Insufficient documentation

## 2021-06-30 NOTE — Progress Notes (Signed)
Nutrition Follow-up:  Patient with lung cancer followed by Dr Tasia Catchings.  Patient receiving durvalumab.  Spoke with patient via phone.  Reports that her appetite is a little bit better.  Has been eating boiled eggs, pimento cheese, deviled ham, cottage cheese and fruit, chicken pot pie, milk, cheese.  Drinking boost plus at least daily.  Denies nausea.      Medications: reviewed  Labs: reviewed  Anthropometrics:   Weight 136 lb on 5/19  141 lb on 4/5 136-141 lb on UBW   NUTRITION DIAGNOSIS: Unintentional weight loss stable   INTERVENTION:  Continue high calorie shake for added nutrition Continue high calorie, high protein foods    MONITORING, EVALUATION, GOAL: weight trends, intake   NEXT VISIT: as needed  Mary Hoover, [place], [place] Registered Dietitian [phone]

## 2021-07-01 ENCOUNTER — Encounter: Payer: Self-pay | Admitting: Oncology

## 2021-07-01 ENCOUNTER — Inpatient Hospital Stay: Payer: Medicare PPO

## 2021-07-01 ENCOUNTER — Inpatient Hospital Stay (HOSPITAL_BASED_OUTPATIENT_CLINIC_OR_DEPARTMENT_OTHER): Payer: Medicare PPO | Admitting: Oncology

## 2021-07-01 VITALS — BP 143/57 | HR 90 | Temp 97.5°F | Wt 138.0 lb

## 2021-07-01 DIAGNOSIS — R634 Abnormal weight loss: Secondary | ICD-10-CM | POA: Diagnosis not present

## 2021-07-01 DIAGNOSIS — C3491 Malignant neoplasm of unspecified part of right bronchus or lung: Secondary | ICD-10-CM

## 2021-07-01 DIAGNOSIS — T451X5A Adverse effect of antineoplastic and immunosuppressive drugs, initial encounter: Secondary | ICD-10-CM

## 2021-07-01 DIAGNOSIS — Z79899 Other long term (current) drug therapy: Secondary | ICD-10-CM | POA: Diagnosis not present

## 2021-07-01 DIAGNOSIS — Z5112 Encounter for antineoplastic immunotherapy: Secondary | ICD-10-CM

## 2021-07-01 DIAGNOSIS — Z9221 Personal history of antineoplastic chemotherapy: Secondary | ICD-10-CM | POA: Diagnosis not present

## 2021-07-01 DIAGNOSIS — C539 Malignant neoplasm of cervix uteri, unspecified: Secondary | ICD-10-CM

## 2021-07-01 DIAGNOSIS — Z923 Personal history of irradiation: Secondary | ICD-10-CM | POA: Diagnosis not present

## 2021-07-01 DIAGNOSIS — D6481 Anemia due to antineoplastic chemotherapy: Secondary | ICD-10-CM

## 2021-07-01 LAB — CBC WITH DIFFERENTIAL/PLATELET
Abs Immature Granulocytes: 0.03 10*3/uL (ref 0.00–0.07)
Basophils Absolute: 0 10*3/uL (ref 0.0–0.1)
Basophils Relative: 1 %
Eosinophils Absolute: 0.2 10*3/uL (ref 0.0–0.5)
Eosinophils Relative: 3 %
HCT: 30.9 % — ABNORMAL LOW (ref 36.0–46.0)
Hemoglobin: 10.6 g/dL — ABNORMAL LOW (ref 12.0–15.0)
Immature Granulocytes: 0 %
Lymphocytes Relative: 6 %
Lymphs Abs: 0.5 10*3/uL — ABNORMAL LOW (ref 0.7–4.0)
MCH: 33.9 pg (ref 26.0–34.0)
MCHC: 34.3 g/dL (ref 30.0–36.0)
MCV: 98.7 fL (ref 80.0–100.0)
Monocytes Absolute: 0.6 10*3/uL (ref 0.1–1.0)
Monocytes Relative: 7 %
Neutro Abs: 6.8 10*3/uL (ref 1.7–7.7)
Neutrophils Relative %: 83 %
Platelets: 263 10*3/uL (ref 150–400)
RBC: 3.13 MIL/uL — ABNORMAL LOW (ref 3.87–5.11)
RDW: 11.6 % (ref 11.5–15.5)
WBC: 8.2 10*3/uL (ref 4.0–10.5)
nRBC: 0 % (ref 0.0–0.2)

## 2021-07-01 LAB — COMPREHENSIVE METABOLIC PANEL
ALT: 11 U/L (ref 0–44)
AST: 18 U/L (ref 15–41)
Albumin: 3.4 g/dL — ABNORMAL LOW (ref 3.5–5.0)
Alkaline Phosphatase: 62 U/L (ref 38–126)
Anion gap: 6 (ref 5–15)
BUN: 28 mg/dL — ABNORMAL HIGH (ref 8–23)
CO2: 23 mmol/L (ref 22–32)
Calcium: 8.6 mg/dL — ABNORMAL LOW (ref 8.9–10.3)
Chloride: 103 mmol/L (ref 98–111)
Creatinine, Ser: 0.98 mg/dL (ref 0.44–1.00)
GFR, Estimated: >60 mL/min (ref >60)
Glucose, Bld: 105 mg/dL — ABNORMAL HIGH (ref 70–99)
Potassium: 5.3 mmol/L — ABNORMAL HIGH (ref 3.5–5.1)
Sodium: 132 mmol/L — ABNORMAL LOW (ref 135–145)
Total Bilirubin: 0.4 mg/dL (ref 0.3–1.2)
Total Protein: 6.5 g/dL (ref 6.5–8.1)

## 2021-07-01 LAB — T4, FREE: Free T4: 1.34 ng/dL — ABNORMAL HIGH (ref 0.61–1.12)

## 2021-07-01 LAB — TSH: TSH: 0.387 u[IU]/mL (ref 0.350–4.500)

## 2021-07-01 MED ORDER — HEPARIN SOD (PORK) LOCK FLUSH 100 UNIT/ML IV SOLN
500.0000 [IU] | Freq: Once | INTRAVENOUS | Status: AC | PRN
  Administered 2021-07-01: 500 [IU]
  Filled 2021-07-01: qty 5

## 2021-07-01 MED ORDER — SODIUM CHLORIDE 0.9 % IV SOLN
10.0000 mg/kg | Freq: Once | INTRAVENOUS | Status: AC
  Administered 2021-07-01: 620 mg via INTRAVENOUS
  Filled 2021-07-01: qty 2.4

## 2021-07-01 MED ORDER — SODIUM CHLORIDE 0.9 % IV SOLN
Freq: Once | INTRAVENOUS | Status: AC
  Filled 2021-07-01: qty 250

## 2021-07-01 MED ORDER — SODIUM CHLORIDE 0.9% FLUSH
10.0000 mL | INTRAVENOUS | Status: DC | PRN
  Administered 2021-07-01: 10 mL
  Filled 2021-07-01: qty 10

## 2021-07-01 NOTE — Patient Instructions (Signed)
[facility] CANCER CTR AT [place]  Discharge Instructions: Thank you for choosing [facility] to provide your oncology and hematology care.  If you have a lab appointment with the [facility], please go directly to the [facility] and check in at the registration area.  Wear comfortable clothing and clothing appropriate for easy access to any Portacath or PICC line.   We strive to give you quality time with your provider. You may need to reschedule your appointment if you arrive late (15 or more minutes).  Arriving late affects you and other patients whose appointments are after yours.  Also, if you miss three or more appointments without notifying the office, you may be dismissed from the clinic at the provider's discretion.      For prescription refill requests, have your pharmacy contact our office and allow 72 hours for refills to be completed.    Today you received the following chemotherapy and/or immunotherapy agents Durvalumab      To help prevent nausea and vomiting after your treatment, we encourage you to take your nausea medication as directed.  BELOW ARE SYMPTOMS THAT SHOULD BE REPORTED IMMEDIATELY: *FEVER GREATER THAN 100.4 F (38 C) OR HIGHER *CHILLS OR SWEATING *NAUSEA AND VOMITING THAT IS NOT CONTROLLED WITH YOUR NAUSEA MEDICATION *UNUSUAL SHORTNESS OF BREATH *UNUSUAL BRUISING OR BLEEDING *URINARY PROBLEMS (pain or burning when urinating, or frequent urination) *BOWEL PROBLEMS (unusual diarrhea, constipation, pain near the anus) TENDERNESS IN MOUTH AND THROAT WITH OR WITHOUT PRESENCE OF ULCERS (sore throat, sores in mouth, or a toothache) UNUSUAL RASH, SWELLING OR PAIN  UNUSUAL VAGINAL DISCHARGE OR ITCHING   Items with * indicate a potential emergency and should be followed up as soon as possible or go to the Emergency Department if any problems should occur.  Please show the CHEMOTHERAPY ALERT CARD or IMMUNOTHERAPY ALERT CARD at check-in to  the Emergency Department and triage nurse.  Should you have questions after your visit or need to cancel or reschedule your appointment, please contact [facility] CANCER [place] AT [place]  [phone] and follow the prompts.  Office hours are 8:00 a.m. to 4:30 p.m. Monday - Friday. Please note that voicemails left after 4:00 p.m. may not be returned until the following business day.  We are closed weekends and major holidays. You have access to a nurse at all times for urgent questions. Please call the main number to the clinic [phone] and follow the prompts.  For any non-urgent questions, you may also contact your provider using MyChart. We now offer e-Visits for anyone 43 and older to request care online for non-urgent symptoms. For details visit [email or web address].   Also download the MyChart app! Go to the app store, search "MyChart", open the app, select [facility], and log in with your MyChart username and password.  Due to Covid, a mask is required upon entering the hospital/clinic. If you do not have a mask, one will be given to you upon arrival. For doctor visits, patients may have 1 support person aged 54 or older with them. For treatment visits, patients cannot have anyone with them due to current Covid guidelines and our immunocompromised population.

## 2021-07-01 NOTE — Progress Notes (Signed)
Hematology/Oncology Progress note [phone] [phone]      Patient Care Team: [name], DO as PCP - General (Family Medicine) Clent Jacks, RN as Oncology Nurse Navigator Noreene Filbert, MD as Radiation Oncologist (Radiation Oncology) Telford Nab, RN as Oncology Nurse Navigator [name], MD as Consulting Physician (Oncology)  REFERRING PROVIDER: [name], DO  CHIEF COMPLAINTS/REASON FOR VISIT:  Follow up lung cancer treatments  HISTORY OF PRESENTING ILLNESS:  # Cervix cancer Patient has developed postmenopausal vaginal bleeding and discharge.  She was noted to have a friable cervix/2 cm mass of the cervix, concerning for malignancy 12/19/2018 endometrial biopsy showed scattered atypical squamous cells suspicious for malignancy.  Predominantly necrosis with associated inflammation. 01/01/2019 initial cervix biopsy showed at least high-grade squamous intraepithelial lesion.-HPV negative 01/22/2019, repeat vaginal wall biopsy and cervix 9:00 biopsy showed squamous cell carcinoma.    Staging images 12/31/2018 showed fluid distending the endometrial canal and or endometrial thickening to the level of cervix.  No evidence of pathological lymphadenopathy.  Haziness about the lower cervix.  7 mm irregular pulmonary nodule within the right upper lobe, suspicious for possible primary or metastatic malignancy.  Chronic pleural-parenchymal scarring/fibrosis at the lung apices.  Large amount of stool.  No bowel obstruction. 01/08/2019 PET scan showed marked hypermetabolism in the region of the cervix, compatible with the reported history of cervical cancer. Small bilateral pelvic sidewall lymph nodes discernible FDG accumulation, concerning for metastatic disease although neither lymph node is enlarged by CT size criteria. 7 mm nodule in the right upper lobe shows discernible FDG accumulation.  Questionable neoplasm, primary versus  metastatic. Emphysema.   #Chronic hearing loss  # Lung nodule, FDG avid, questionable primary bronchogenic carcinoma versus metastatic cancer.Discussed with radiation oncology.  Her case was also discussed on tumor board.  Consensus reached on finishing concurrent chemoradiation treatments for cervix cancer first. Possible SBRT to lung nodule for presumed primary lung cancer.  # 02/20/2020 - 03/19/2020 concurrent chemotherapy weekly carboplatin and taxol and radiation for treatment of this cervix cancer.   #December 2021 lung nodule,was evaluated by Dr.Oaks. Options of biopsy via transthoracic or endobronchial approach vs surgical resection.  Patient opted to empiric SBRT  Patient was last seen by me on 06/25/2019.  Lost follow-up. She continues to follow with radiation oncology. 11/20/2020, CT chest with contrast showed minimal residual of previously seen right upper lobe nodule.  0.6 x 0.4 cm.  Internal development of mild bandlike radiation fibrosis.  Newly enlarged pretracheal lymph node, measuring 2.5 x 1.7 cm.  Highly concerning for metastatic disease.  Unchanged prominent AP window and the right hilar lymph nodes.  Emphysema and diffuse bilateral bronchial wall thickening.  Coronary artery disease. 12/13/2020, PET scan showed enlarging hypermetabolic right lower paratracheal lymph node, compatible with metastatic disease.  SUV 10.3.  Mild FDG uptake of the bilateral sacral alla with associated lucency.  Concerning for sacral insufficiency fracture.  Stable posttreatment changes of the right upper lobe nodule.  Nonspecific small solid 3 mm pulmonary nodule of the left lower lobe.  2 small to be characterized.  No evidence of FDG avid metastatic disease in the abdomen or pelvis.  Aortic atherosclerosis and emphysema.  Patient reestablish care on 12/27/2020 for lung cancer treatments   #01/12/2021 patient underwent bronchoscopy biopsy by Dr. Patsey Berthold Right paratracheal lymph node fine-needle  aspiration is positive for metastatic non-small cell carcinoma, favor adenocarcinoma of lung origin.  Circulogen NGS negative PD-L1, ALK, ROS1, NTRK1/2/3,   # 02/14/2021-03/31/2021, concurrent carboplatin  AUC 2 and taxol 89m/m2 with radiation.  04/26/2021, CT chest with contrast showed evolving postradiation changes in the posterior perifissural upper right lung without discrete residual measurable nodule in this location.  Right hilar/mediastinal lymphadenopathy stable to decreased.  Tiny 0.2 cm left lower lobe pulmonary nodule slightly decreased.  Positive response to treatment.  No new or progressive disease.  Three-vessel coronary arthrosclerosis.  Aortic atherosclerosis/emphysema. INTERVAL HISTORY Mary ELLEGOODis a 76y.o. female who has above history reviewed by me today presents for follow up visit for management of possible recurrent lung cancer.  History of cervix cancer.   Patient reports feeling well today.  No new complaints.  Weight has been stable.  Denies any nausea vomiting diarrhea, shortness of breath.  Review of Systems  Constitutional:  Positive for fatigue. Negative for appetite change, chills, fever and unexpected weight change.  HENT:   Positive for hearing loss. Negative for voice change.   Eyes:  Negative for eye problems.  Respiratory:  Negative for chest tightness and cough.   Cardiovascular:  Negative for chest pain.  Gastrointestinal:  Negative for abdominal distention, abdominal pain and blood in stool.  Endocrine: Negative for hot flashes.  Genitourinary:  Negative for difficulty urinating and frequency.   Musculoskeletal:  Negative for arthralgias.  Skin:  Negative for itching and rash.  Neurological:  Negative for extremity weakness.  Hematological:  Negative for adenopathy.  Psychiatric/Behavioral:  Negative for confusion.    MEDICAL HISTORY:  Past Medical History:  Diagnosis Date   Arthritis    Cancer ([facility]    Carotid atherosclerosis    Carotid  bruit    Cervical cancer (HCC)    COPD (chronic obstructive pulmonary disease) (HCC)    emphysema   Coronary artery disease    Family history of adverse reaction to anesthesia    paternal grandfather died during surgery-pt unaware what happened   GERD (gastroesophageal reflux disease)    Hyperlipidemia    Hypertension    Hyponatremia 02/27/2019   Hypothyroidism    Infected cat bite 1980s   was hospitalized   Irregular heartbeat    Medical history non-contributory    Peripheral vascular disease ([place]    Tobacco use     SURGICAL HISTORY: Past Surgical History:  Procedure Laterality Date   CAROTID ENDARTERECTOMY Right Oct. 2015   Dr. DLucky Cowboy  CAROTID STENOSIS Left April 2016   carotid stenosis surgery   COLONOSCOPY WITH PROPOFOL N/A 11/08/2016   Procedure: COLONOSCOPY WITH PROPOFOL;  Surgeon: AJonathon Bellows MD;  [facility]  Service: Gastroenterology;  Laterality: N/A;   ESOPHAGOGASTRODUODENOSCOPY (EGD) WITH PROPOFOL N/A 11/08/2016   Procedure: ESOPHAGOGASTRODUODENOSCOPY (EGD) WITH PROPOFOL;  Surgeon: AJonathon Bellows MD;  [facility];  Service: Gastroenterology;  Laterality: N/A;   FLEXIBLE BRONCHOSCOPY N/A 12/17/2014   Procedure: FLEXIBLE BRONCHOSCOPY;  Surgeon: DWilhelmina Mcardle MD;  Location: [facility] ORS;  Service: Pulmonary;  Laterality: N/A;   INCISION AND DRAINAGE / EXCISION THYROGLOSSAL CYST  April 2011   PORTA CATH INSERTION N/A 03/03/2021   Procedure: PORTA CATH INSERTION;  Surgeon: DAlgernon Huxley MD;  [facility];  Service: Cardiovascular;  Laterality: N/A;   VIDEO BRONCHOSCOPY WITH ENDOBRONCHIAL ULTRASOUND N/A 01/12/2021   Procedure: VIDEO BRONCHOSCOPY WITH ENDOBRONCHIAL ULTRASOUND;  Surgeon: GTyler Pita MD;  Location: [facility] ORS;  Service: Cardiopulmonary;  Laterality: N/A;    SOCIAL HISTORY: Social History   Socioeconomic History   Marital status: Married    Spouse name: RDeidre Ala  Number  of children: Not on file   Years of  education: Not on file   Highest education level: 9th grade  Occupational History   Occupation: retired   Tobacco Use   Smoking status: Every Day    Packs/day: 0.50    Years: 50.00    Pack years: 25.00    Types: Cigarettes   Smokeless tobacco: Never   Tobacco comments:    0.5 PPD 02/10/2021  Vaping Use   Vaping Use: Former  Substance and Sexual Activity   Alcohol use: No    Alcohol/week: 0.0 standard drinks   Drug use: No   Sexual activity: Yes  Other Topics Concern   Not on file  Social History Narrative   Lives at home with husband   Social Determinants of Health   Financial Resource Strain: Low Risk    Difficulty of Paying Living Expenses: Not hard at all  Food Insecurity: No Food Insecurity   Worried About Charity fundraiser in the Last Year: Never true   [place] in the Last Year: Never true  Transportation Needs: No Transportation Needs   Lack of Transportation (Medical): No   Lack of Transportation (Non-Medical): No  Physical Activity: Inactive   Days of Exercise per Week: 0 days   Minutes of Exercise per Session: 0 min  Stress: No Stress Concern Present   Feeling of Stress : Not at all  Social Connections: Moderately Isolated   Frequency of Communication with Friends and Family: More than three times a week   Frequency of Social Gatherings with Friends and Family: More than three times a week   Attends Religious Services: Never   Marine scientist or Organizations: No   Attends Music therapist: Never   Marital Status: Married  Human resources officer Violence: Not At Risk   Fear of Current or Ex-Partner: No   Emotionally Abused: No   Physically Abused: No   Sexually Abused: No    FAMILY HISTORY: Family History  Problem Relation Age of Onset   Congestive Heart Failure Mother    Heart disease Mother    Hypertension Mother    Hypertension Sister    Diabetes Sister    Heart disease Sister    Heart disease Maternal Uncle    Heart  disease Maternal Grandmother    Stroke Maternal Grandfather    Cancer Brother        liver, lung   Heart disease Brother    Hypertension Brother    Heart attack Brother    COPD Neg Hx    Breast cancer Neg Hx     ALLERGIES:  is allergic to atorvastatin.  MEDICATIONS:  Current Outpatient Medications  Medication Sig Dispense Refill   acetaminophen (TYLENOL) 500 MG tablet Take 500 mg by mouth every 6 (six) hours as needed for moderate pain.     alendronate (FOSAMAX) 70 MG tablet Take 1 tablet (70 mg total) by mouth every 7 (seven) days. Take with a full glass of water on an empty stomach. 12 tablet 3   chlorhexidine (PERIDEX) 0.12 % solution Use as directed 5 mLs in the mouth or throat 2 (two) times daily. 120 mL 0   clopidogrel (PLAVIX) 75 MG tablet Take 1 tablet (75 mg total) by mouth daily. 90 tablet 3   cyclobenzaprine (FLEXERIL) 10 MG tablet TAKE 1 TABLET AT BEDTIME 60 tablet 3   ferrous sulfate 325 (65 FE) MG EC tablet Take 1 tablet (325 mg total) by mouth  daily with breakfast. 90 tablet 4   Fluticasone-Umeclidin-Vilant (TRELEGY ELLIPTA) 100-62.5-25 MCG/INH AEPB Inhale 1 puff into the lungs daily. 1 each 11   levothyroxine (SYNTHROID) 88 MCG tablet Take 1 tablet (88 mcg total) by mouth daily. 90 tablet 3   lidocaine (LIDODERM) 5 % Place 1 patch onto the skin daily. Remove & Discard patch within 12 hours or as directed by MD 90 patch 1   lisinopril (ZESTRIL) 20 MG tablet Take 1 tablet (20 mg total) by mouth daily. (Patient taking differently: Take 20 mg by mouth.) 90 tablet 1   Multiple Vitamin (MULTIVITAMIN) tablet Take 1 tablet by mouth daily.     naproxen (NAPROSYN) 500 MG tablet TAKE 1 TABLET TWICE DAILY WITH MEALS 180 tablet 1   omeprazole (PRILOSEC) 20 MG capsule TAKE 1 CAPSULE EVERY DAY 90 capsule 2   vitamin B-12 (CYANOCOBALAMIN) 500 MCG tablet Take 500 mcg by mouth daily.     gabapentin (NEURONTIN) 100 MG capsule Take 1 capsule in the morning and afternoon, and 3 capsules at  bedtime (Patient not taking: Reported on 06/08/2021) 450 capsule 1   lidocaine-prilocaine (EMLA) cream Apply 1 application topically as needed. (Patient not taking: Reported on 06/03/2021) 60 g 3   ondansetron (ZOFRAN) 8 MG tablet Take 1 tablet (8 mg total) by mouth 2 (two) times daily as needed for refractory nausea / vomiting. Start on day 3 after chemo. (Patient not taking: Reported on 06/03/2021) 30 tablet 1   No current facility-administered medications for this visit.     PHYSICAL EXAMINATION: ECOG PERFORMANCE STATUS: 1 - Symptomatic but completely ambulatory Vitals:   07/01/21 1017  BP: (!) 143/57  Pulse: 90  Temp: (!) 97.5 F (36.4 C)   Filed Weights   07/01/21 1017  Weight: 138 lb (62.6 kg)    Physical Exam Constitutional:      General: She is not in acute distress. HENT:     Head: Normocephalic and atraumatic.  Eyes:     General: No scleral icterus.    Pupils: Pupils are equal, round, and reactive to light.  Cardiovascular:     Rate and Rhythm: Normal rate and regular rhythm.     Heart sounds: Normal heart sounds.  Pulmonary:     Effort: Pulmonary effort is normal. No respiratory distress.     Breath sounds: No wheezing.     Comments: Decreased breath sound bilaterally  Abdominal:     General: Bowel sounds are normal. There is no distension.     Palpations: Abdomen is soft. There is no mass.     Tenderness: There is no abdominal tenderness.  Musculoskeletal:        General: No deformity. Normal range of motion.     Cervical back: Normal range of motion and neck supple.     Comments: Trace edema bilaterally  Skin:    General: Skin is warm and dry.     Findings: No erythema or rash.  Neurological:     Mental Status: She is alert and oriented to person, place, and time. Mental status is at baseline.     Cranial Nerves: No cranial nerve deficit.     Coordination: Coordination normal.  Psychiatric:        Mood and Affect: Mood normal.    LABORATORY DATA:  I  have reviewed the data as listed Lab Results  Component Value Date   WBC 8.2 07/01/2021   HGB 10.6 (L) 07/01/2021   HCT 30.9 (L) 07/01/2021   MCV 98.7  07/01/2021   PLT 263 07/01/2021   Recent Labs    06/03/21 0832 06/17/21 0947 07/01/21 0958  NA 131* 132* 132*  K 4.4 4.5 5.3*  CL 104 103 103  CO2 _0 GLUCOSE 93 108* 105*  BUN 18 20 28*  CREATININE 1.08* 0.96 0.98  CALCIUM 8.5* 8.8* 8.6*  GFRNONAA 54* >60 >60  PROT 6.5 6.6 6.5  ALBUMIN 3.5 3.6 3.4*  AST _1 ALT _2 ALKPHOS 59 59 62  BILITOT 0.7 0.8 0.4    Iron/TIBC/Ferritin/ %Sat    Component Value Date/Time   IRON 90 05/04/2021 0929   TIBC 321 05/04/2021 0929   FERRITIN 52 05/04/2021 0929   IRONPCTSAT 28 05/04/2021 0929      RADIOGRAPHIC STUDIES: I have personally reviewed the radiological images as listed and agreed with the findings in the report. CT Chest W Contrast  Result Date: 04/26/2021 CLINICAL DATA:  Non-small cell right lung cancer status post radiation therapy. Restaging. Additional history of cervical cancer. * Tracking Code: BO * EXAM: CT CHEST WITH CONTRAST TECHNIQUE: Multidetector CT imaging of the chest was performed during intravenous contrast administration. RADIATION DOSE REDUCTION: This exam was performed according to the departmental dose-optimization program which includes automated exposure control, adjustment of the mA and/or kV according to patient size and/or use of iterative reconstruction technique. CONTRAST:  71m OMNIPAQUE IOHEXOL 300 MG/ML  SOLN COMPARISON:  12/13/2020 PET-CT.  11/18/2020 chest CT. FINDINGS: Cardiovascular: Normal heart size. No significant pericardial effusion/thickening. Three-vessel coronary atherosclerosis. Left internal jugular Port-A-Cath terminates at the cavoatrial junction. Atherosclerotic nonaneurysmal thoracic aorta. Normal caliber pulmonary arteries. No central pulmonary emboli. Mediastinum/Nodes: No discrete thyroid nodules. Unremarkable  esophagus. No axillary adenopathy. Previously enlarged 1.6 cm short axis right paratracheal node is decreased to 0.6 cm (series 2/image 54). Previously enlarged 1.0 cm AP window node is decreased to 0.8 cm (series 2/image 59). Previously enlarged 1.0 cm right hilar node is stable (series 2/image 66). No new pathologically enlarged mediastinal or hilar nodes. Lungs/Pleura: No pneumothorax. No pleural effusion. Severe centrilobular emphysema with mild diffuse bronchial wall thickening. Tiny posterior left lower lobe solid 0.2 cm pulmonary nodule (series 3/image 101), slightly decreased from 0.3 cm on 11/18/2020 chest CT. New triangular subpleural opacity in the posterior right lower lobe (series 3/image 100), favor focal atelectasis. Sharply marginated patchy consolidation, reticulation and ground-glass opacity in the posterior perifissural upper right lung (series 3/image 54), increased, compatible with evolving post radiation change, without discrete residual measurable nodule in this location. No new significant pulmonary nodules. Upper abdomen: No acute abnormality. Musculoskeletal: No aggressive appearing focal osseous lesions. Moderate thoracic spondylosis. IMPRESSION: 1. Evolving post radiation change in the posterior perifissural upper right lung, without discrete residual measurable nodule in this location. 2. Mild right hilar and mediastinal lymphadenopathy is stable to decreased. Tiny 0.2 cm left lower lobe pulmonary nodule is slightly decreased in size. Findings are compatible with positive response to therapy. 3. No new or progressive metastatic disease in the chest. 4. Three-vessel coronary atherosclerosis. 5. Aortic Atherosclerosis (ICD10-I70.0) and Emphysema (ICD10-J43.9). Electronically Signed   By: JIlona SorrelM.D.   On: 04/26/2021 14:30       ASSESSMENT & PLAN:  1. Primary adenocarcinoma of right lung ([facility]   2. Encounter for antineoplastic immunotherapy   3. Anemia due to antineoplastic  chemotherapy   4. Malignant neoplasm of cervix, unspecified site ([facility] [place]   5. Loss of weight    Cancer Staging  Cervical cancer (  [facility]) Staging form: Cervix Uteri, AJCC Version 9 - Clinical: FIGO Stage IIICr (cTX, cN1) - Unsigned  Primary lung adenocarcinoma ([place]) Staging form: Lung, AJCC 7th Edition - Clinical stage from 01/12/2021: T1, N1, M0 - Signed by [name], MD on 01/29/2021   #Stage III right lung adenocarcinoma, recurrent  -concurrent chemotherapy and radiation-immunotherapy durvalumab maintenance Labs reviewed and discussed with patient.  Proceed with Durvalumab treatments. Plan repeat CT chest surveillance scan at the end of June.  #Normocytic anemia, hemoglobin stable.  # Locally advanced cervical cancer stage IIIC1 with pelvic nodes involvement. S/p concurrent chemotherapy and radiation followed by brachytherapy.  She is not actively following up with gynecology oncology.  I recommend patient to reestablish care.  # Hypocalcemia, on Fosamax.  Continue calcium supplementation 1241m daily.- otc supply.     #Weight loss, follow-up with nutritionist.  Encourage nutrition supplements.  Weight has been stable. All questions were answered. The patient knows to call the clinic with any problems questions or concerns.   Return of visit:   2 weeks lab MD Durvalumab.    ZEarlie Server MD, PhD 07/01/2021

## 2021-07-02 ENCOUNTER — Other Ambulatory Visit: Payer: Self-pay | Admitting: Family Medicine

## 2021-07-04 NOTE — Telephone Encounter (Signed)
Requested Prescriptions  Pending Prescriptions Disp Refills  . gabapentin (NEURONTIN) 100 MG capsule [Pharmacy Med Name: GABAPENTIN 100 MG Capsule] 270 capsule     Sig: TAKE 1 CAPSULE THREE TIMES DAILY     Neurology: Anticonvulsants - gabapentin Passed - 07/02/2021 11:37 AM      Passed - Cr in normal range and within 360 days    Creatinine  Date Value Ref Range Status  05/14/2014 0.78 mg/dL Final    Comment:    0.44-1.00 NOTE: New Reference Range  04/07/14    Creatinine, Ser  Date Value Ref Range Status  07/01/2021 0.98 0.44 - 1.00 mg/dL Final         Passed - Completed PHQ-2 or PHQ-9 in the last 360 days      Passed - Valid encounter within last 12 months    Recent Outpatient Visits          3 months ago LUQ pain   Graham, Megan P, DO   6 months ago Essential hypertension   [place]ec[place], [place], DO   7 months ago Essential hypertension   [place], [employer], DO   12 months ago [place], [place], DO   1 year ago Grief   [employer], Megan P, DO             . lisinopril (ZESTRIL) 20 MG tablet [Pharmacy Med Name: LISINOPRIL 20 MG Tablet] 90 tablet 1    Sig: TAKE 1 TABLET EVERY DAY     Cardiovascular:  ACE Inhibitors Failed - 07/02/2021 11:37 AM      Failed - K in normal range and within 180 days    Potassium  Date Value Ref Range Status  07/01/2021 5.3 (H) 3.5 - 5.1 mmol/L Final  05/14/2014 4.3 mmol/L Final    Comment:    3.5-5.1 NOTE: New Reference Range  04/07/14          Failed - Last BP in normal range    BP Readings from Last 1 Encounters:  07/01/21 (!) 143/57         Passed - Cr in normal range and within 180 days    Creatinine  Date Value Ref Range Status  05/14/2014 0.78 mg/dL Final    Comment:    0.44-1.00 NOTE: New Reference Range  04/07/14    Creatinine, Ser  Date Value Ref Range Status  07/01/2021 0.98 0.44 - 1.00  mg/dL Final         Passed - Patient is not pregnant      Passed - Valid encounter within last 6 months    Recent Outpatient Visits          3 months ago LUQ pain   [place], [place], DO   6 months ago Essential hypertension   [place], [place], DO   7 months ago Essential hypertension   [place], [place], DO   12 months ago [place], [place], DO   1 year ago Grief   [employer], Megan P, DO             . naproxen (NAPROSYN) 500 MG tablet [Pharmacy Med Name: NAPROXEN 500 MG Tablet] 180 tablet 1    Sig: TAKE 1 TABLET TWICE DAILY WITH MEALS     Analgesics:  NSAIDS Failed - 07/02/2021 11:37  AM      Failed - Manual Review: Labs are only required if the patient has taken medication for more than 8 weeks.      Failed - HGB in normal range and within 360 days    Hemoglobin  Date Value Ref Range Status  07/01/2021 10.6 (L) 12.0 - 15.0 g/dL Final  03/25/2021 9.3 (L) 11.1 - 15.9 g/dL Final         Failed - HCT in normal range and within 360 days    HCT  Date Value Ref Range Status  07/01/2021 30.9 (L) 36.0 - 46.0 % Final   Hematocrit  Date Value Ref Range Status  03/25/2021 26.8 (L) 34.0 - 46.6 % Final         Passed - Cr in normal range and within 360 days    Creatinine  Date Value Ref Range Status  05/14/2014 0.78 mg/dL Final    Comment:    0.44-1.00 NOTE: New Reference Range  04/07/14    Creatinine, Ser  Date Value Ref Range Status  07/01/2021 0.98 0.44 - 1.00 mg/dL Final         Passed - PLT in normal range and within 360 days    Platelets  Date Value Ref Range Status  07/01/2021 263 150 - 400 K/uL Final  03/25/2021 326 150 - 450 x10E3/uL Final         Passed - eGFR is 30 or above and within 360 days    EGFR (African American)  Date Value Ref Range Status  05/14/2014 >60  Final   GFR calc Af Amer  Date Value Ref Range Status   11/14/2019 64 >59 mL/min/1.73 Final    Comment:    **In accordance with recommendations from the NKF-ASN Task force,**   [facility] is in the process of updating its eGFR calculation to the   2021 CKD-EPI creatinine equation that estimates kidney function   without a race variable.    EGFR (Non-African Amer.)  Date Value Ref Range Status  05/14/2014 >60  Final    Comment:    eGFR values <50m/min/1.73 m2 may be an indication of chronic kidney disease (CKD). Calculated eGFR is useful in patients with stable renal function. The eGFR calculation will not be reliable in acutely ill patients when serum creatinine is changing rapidly. It is not useful in patients on dialysis. The eGFR calculation may not be applicable to patients at the low and high extremes of body sizes, pregnant women, and vegetarians.    GFR, Estimated  Date Value Ref Range Status  07/01/2021 >60 >60 mL/min Final    Comment:    (NOTE) Calculated using the CKD-EPI Creatinine Equation (2021)    eGFR  Date Value Ref Range Status  03/25/2021 61 >59 mL/min/1.73 Final         Passed - Patient is not pregnant      Passed - Valid encounter within last 12 months    Recent Outpatient Visits          3 months ago LUQ pain   [place]   6 months ago Essential hypertension   [place]   7 months ago Essential hypertension   [place] DO   12 months ago [place] DO   1 year ago [place]

## 2021-07-15 ENCOUNTER — Inpatient Hospital Stay: Payer: Medicare PPO

## 2021-07-15 ENCOUNTER — Encounter: Payer: Self-pay | Admitting: Oncology

## 2021-07-15 ENCOUNTER — Inpatient Hospital Stay (HOSPITAL_BASED_OUTPATIENT_CLINIC_OR_DEPARTMENT_OTHER): Payer: Medicare PPO | Admitting: Oncology

## 2021-07-15 DIAGNOSIS — Z79899 Other long term (current) drug therapy: Secondary | ICD-10-CM | POA: Diagnosis not present

## 2021-07-15 DIAGNOSIS — C539 Malignant neoplasm of cervix uteri, unspecified: Secondary | ICD-10-CM

## 2021-07-15 DIAGNOSIS — C3491 Malignant neoplasm of unspecified part of right bronchus or lung: Secondary | ICD-10-CM

## 2021-07-15 DIAGNOSIS — Z923 Personal history of irradiation: Secondary | ICD-10-CM | POA: Diagnosis not present

## 2021-07-15 DIAGNOSIS — Z5112 Encounter for antineoplastic immunotherapy: Secondary | ICD-10-CM | POA: Diagnosis not present

## 2021-07-15 DIAGNOSIS — Z9221 Personal history of antineoplastic chemotherapy: Secondary | ICD-10-CM | POA: Diagnosis not present

## 2021-07-15 LAB — COMPREHENSIVE METABOLIC PANEL
ALT: 11 U/L (ref 0–44)
AST: 19 U/L (ref 15–41)
Albumin: 3.5 g/dL (ref 3.5–5.0)
Alkaline Phosphatase: 59 U/L (ref 38–126)
Anion gap: 9 (ref 5–15)
BUN: 22 mg/dL (ref 8–23)
CO2: 22 mmol/L (ref 22–32)
Calcium: 8.8 mg/dL — ABNORMAL LOW (ref 8.9–10.3)
Chloride: 102 mmol/L (ref 98–111)
Creatinine, Ser: 1.13 mg/dL — ABNORMAL HIGH (ref 0.44–1.00)
GFR, Estimated: 51 mL/min — ABNORMAL LOW (ref >60)
Glucose, Bld: 117 mg/dL — ABNORMAL HIGH (ref 70–99)
Potassium: 4.2 mmol/L (ref 3.5–5.1)
Sodium: 133 mmol/L — ABNORMAL LOW (ref 135–145)
Total Bilirubin: 0.6 mg/dL (ref 0.3–1.2)
Total Protein: 6.5 g/dL (ref 6.5–8.1)

## 2021-07-15 LAB — CBC WITH DIFFERENTIAL/PLATELET
Abs Immature Granulocytes: 0.03 10*3/uL (ref 0.00–0.07)
Basophils Absolute: 0 10*3/uL (ref 0.0–0.1)
Basophils Relative: 1 %
Eosinophils Absolute: 0.3 10*3/uL (ref 0.0–0.5)
Eosinophils Relative: 4 %
HCT: 31.2 % — ABNORMAL LOW (ref 36.0–46.0)
Hemoglobin: 10.6 g/dL — ABNORMAL LOW (ref 12.0–15.0)
Immature Granulocytes: 1 %
Lymphocytes Relative: 10 %
Lymphs Abs: 0.6 10*3/uL — ABNORMAL LOW (ref 0.7–4.0)
MCH: 32.8 pg (ref 26.0–34.0)
MCHC: 34 g/dL (ref 30.0–36.0)
MCV: 96.6 fL (ref 80.0–100.0)
Monocytes Absolute: 0.6 10*3/uL (ref 0.1–1.0)
Monocytes Relative: 9 %
Neutro Abs: 4.9 10*3/uL (ref 1.7–7.7)
Neutrophils Relative %: 75 %
Platelets: 252 10*3/uL (ref 150–400)
RBC: 3.23 MIL/uL — ABNORMAL LOW (ref 3.87–5.11)
RDW: 11.8 % (ref 11.5–15.5)
WBC: 6.5 10*3/uL (ref 4.0–10.5)
nRBC: 0 % (ref 0.0–0.2)

## 2021-07-15 MED ORDER — SODIUM CHLORIDE 0.9 % IV SOLN
Freq: Once | INTRAVENOUS | Status: AC
  Filled 2021-07-15: qty 250

## 2021-07-15 MED ORDER — HEPARIN SOD (PORK) LOCK FLUSH 100 UNIT/ML IV SOLN
INTRAVENOUS | Status: AC
  Filled 2021-07-15: qty 5

## 2021-07-15 MED ORDER — SODIUM CHLORIDE 0.9 % IV SOLN
10.0000 mg/kg | Freq: Once | INTRAVENOUS | Status: AC
  Administered 2021-07-15: 620 mg via INTRAVENOUS
  Filled 2021-07-15: qty 10

## 2021-07-15 MED ORDER — HEPARIN SOD (PORK) LOCK FLUSH 100 UNIT/ML IV SOLN
INTRAVENOUS | Status: AC
  Administered 2021-07-15: 500 [IU]
  Filled 2021-07-15: qty 5

## 2021-07-15 MED ORDER — HEPARIN SOD (PORK) LOCK FLUSH 100 UNIT/ML IV SOLN
500.0000 [IU] | Freq: Once | INTRAVENOUS | Status: AC | PRN
  Filled 2021-07-15: qty 5

## 2021-07-15 NOTE — Assessment & Plan Note (Addendum)
Stage III right lung adenocarcinoma,  Recurrent S/p concurrent chemotherapy and radiation-immunotherapy durvalumab maintenance Labs reviewed and discussed with patient.  Proceed with Durvalumab treatments. Plan repeat CT chest surveillance scan at the end of June.

## 2021-07-15 NOTE — Patient Instructions (Signed)
[facility] [place]  Discharge Instructions: Thank you for choosing [facility] to provide your oncology and hematology care.  If you have a lab appointment with the [facility], please go directly to the [facility] and check in at the registration area.  Wear comfortable clothing and clothing appropriate for easy access to any Portacath or PICC line.   We strive to give you quality time with your provider. You may need to reschedule your appointment if you arrive late (15 or more minutes).  Arriving late affects you and other patients whose appointments are after yours.  Also, if you miss three or more appointments without notifying the office, you may be dismissed from the clinic at the provider's discretion.      For prescription refill requests, have your pharmacy contact our office and allow 72 hours for refills to be completed.    Today you received the following chemotherapy and/or immunotherapy agents Imfinzi      To help prevent nausea and vomiting after your treatment, we encourage you to take your nausea medication as directed.  BELOW ARE SYMPTOMS THAT SHOULD BE REPORTED IMMEDIATELY: *FEVER GREATER THAN 100.4 F (38 C) OR HIGHER *CHILLS OR SWEATING *NAUSEA AND VOMITING THAT IS NOT CONTROLLED WITH YOUR NAUSEA MEDICATION *UNUSUAL SHORTNESS OF BREATH *UNUSUAL BRUISING OR BLEEDING *URINARY PROBLEMS (pain or burning when urinating, or frequent urination) *BOWEL PROBLEMS (unusual diarrhea, constipation, pain near the anus) TENDERNESS IN MOUTH AND THROAT WITH OR WITHOUT PRESENCE OF ULCERS (sore throat, sores in mouth, or a toothache) UNUSUAL RASH, SWELLING OR PAIN  UNUSUAL VAGINAL DISCHARGE OR ITCHING   Items with * indicate a potential emergency and should be followed up as soon as possible or go to the Emergency Department if any problems should occur.  Please show the CHEMOTHERAPY ALERT CARD or IMMUNOTHERAPY ALERT CARD at check-in to the  Emergency Department and triage nurse.  Should you have questions after your visit or need to cancel or reschedule your appointment, please contact [facility] [place] [facility] [place]  [phone] and follow the prompts.  Office hours are 8:00 a.m. to 4:30 p.m. Monday - Friday. Please note that voicemails left after 4:00 p.m. may not be returned until the following business day.  We are closed weekends and major holidays. You have access to a nurse at all times for urgent questions. Please call the main number to the clinic [phone] and follow the prompts.  For any non-urgent questions, you may also contact your provider using MyChart. We now offer e-Visits for anyone 74 and older to request care online for non-urgent symptoms. For details visit [email or web address].   Also download the MyChart app! Go to the app store, search "MyChart", open the app, select [facility] [place], and log in with your MyChart username and password.  Masks are optional in the cancer centers. If you would like for your care team to wear a mask while they are taking care of you, please let them know. For doctor visits, patients may have with them one support person who is at least 74 years old. At this time, visitors are not allowed in the infusion area.

## 2021-07-15 NOTE — Assessment & Plan Note (Signed)
Immunotherapy plan listed as above.

## 2021-07-15 NOTE — Progress Notes (Signed)
Hematology/Oncology Progress note [phone] [phone]      Patient Care Team: [name], DO as PCP - General (Family Medicine) Clent Jacks, RN as Oncology Nurse Navigator Noreene Filbert, MD as Radiation Oncologist (Radiation Oncology) Telford Nab, RN as Oncology Nurse Navigator [name], MD as Consulting Physician (Oncology)  ASSESSMENT & PLAN:   Primary lung adenocarcinoma [facility]) Stage III right lung adenocarcinoma,  Recurrent S/p concurrent chemotherapy and radiation-immunotherapy durvalumab maintenance Labs reviewed and discussed with patient.  Proceed with Durvalumab treatments. Plan repeat CT chest surveillance scan at the end of June.  Cervical cancer ([place]) Locally advanced cervical cancer stage IIIC1 with pelvic nodes involvement. S/p concurrent chemotherapy and radiation followed by brachytherapy.  She is not actively following up with gynecology oncology.  I recommend patient to reestablish care.  Encounter for antineoplastic immunotherapy Immunotherapy plan listed as above.   No orders of the defined types were placed in this encounter.  Lab md 2 weeks durvalumab All questions were answered. The patient knows to call the clinic with any problems, questions or concerns.  [name], MD, PhD [facility] Hematology Oncology 07/15/2021     CHIEF COMPLAINTS/REASON FOR VISIT:  Follow up lung cancer treatments  HISTORY OF PRESENTING ILLNESS:  # Cervix cancer Patient has developed postmenopausal vaginal bleeding and discharge.  She was noted to have a friable cervix/2 cm mass of the cervix, concerning for malignancy 12/19/2018 endometrial biopsy showed scattered atypical squamous cells suspicious for malignancy.  Predominantly necrosis with associated inflammation. 01/01/2019 initial cervix biopsy showed at least high-grade squamous intraepithelial lesion.-HPV negative 01/22/2019, repeat vaginal wall biopsy and cervix 9:00 biopsy  showed squamous cell carcinoma.    Staging images 12/31/2018 showed fluid distending the endometrial canal and or endometrial thickening to the level of cervix.  No evidence of pathological lymphadenopathy.  Haziness about the lower cervix.  7 mm irregular pulmonary nodule within the right upper lobe, suspicious for possible primary or metastatic malignancy.  Chronic pleural-parenchymal scarring/fibrosis at the lung apices.  Large amount of stool.  No bowel obstruction. 01/08/2019 PET scan showed marked hypermetabolism in the region of the cervix, compatible with the reported history of cervical cancer. Small bilateral pelvic sidewall lymph nodes discernible FDG accumulation, concerning for metastatic disease although neither lymph node is enlarged by CT size criteria. 7 mm nodule in the right upper lobe shows discernible FDG accumulation.  Questionable neoplasm, primary versus metastatic. Emphysema.   #Chronic hearing loss  # Lung nodule, FDG avid, questionable primary bronchogenic carcinoma versus metastatic cancer.Discussed with radiation oncology.  Her case was also discussed on tumor board.  Consensus reached on finishing concurrent chemoradiation treatments for cervix cancer first. Possible SBRT to lung nodule for presumed primary lung cancer.  # 02/20/2020 - 03/19/2020 concurrent chemotherapy weekly carboplatin and taxol and radiation for treatment of this cervix cancer.   #December 2021 lung nodule,was evaluated by Dr.Oaks. Options of biopsy via transthoracic or endobronchial approach vs surgical resection.  Patient opted to empiric SBRT  Patient was last seen by me on 06/25/2019.  Lost follow-up. She continues to follow with radiation oncology. 11/20/2020, CT chest with contrast showed minimal residual of previously seen right upper lobe nodule.  0.6 x 0.4 cm.  Internal development of mild bandlike radiation fibrosis.  Newly enlarged pretracheal lymph node, measuring 2.5 x 1.7 cm.  Highly  concerning for metastatic disease.  Unchanged prominent AP window and the right hilar lymph nodes.  Emphysema and diffuse bilateral bronchial wall thickening.  Coronary  artery disease. 12/13/2020, PET scan showed enlarging hypermetabolic right lower paratracheal lymph node, compatible with metastatic disease.  SUV 10.3.  Mild FDG uptake of the bilateral sacral alla with associated lucency.  Concerning for sacral insufficiency fracture.  Stable posttreatment changes of the right upper lobe nodule.  Nonspecific small solid 3 mm pulmonary nodule of the left lower lobe.  2 small to be characterized.  No evidence of FDG avid metastatic disease in the abdomen or pelvis.  Aortic atherosclerosis and emphysema.  Patient reestablish care on 12/27/2020 for lung cancer treatments   #01/12/2021 patient underwent bronchoscopy biopsy by Dr. Patsey Berthold Right paratracheal lymph node fine-needle aspiration is positive for metastatic non-small cell carcinoma, favor adenocarcinoma of lung origin.  Circulogen NGS negative PD-L1, ALK, ROS1, NTRK1/2/3,   # 02/14/2021-03/31/2021, concurrent carboplatin AUC 2 and taxol 23m/m2 with radiation.  04/26/2021, CT chest with contrast showed evolving postradiation changes in the posterior perifissural upper right lung without discrete residual measurable nodule in this location.  Right hilar/mediastinal lymphadenopathy stable to decreased.  Tiny 0.2 cm left lower lobe pulmonary nodule slightly decreased.  Positive response to treatment.  No new or progressive disease.  Three-vessel coronary arthrosclerosis.  Aortic atherosclerosis/emphysema. INTERVAL HISTORY JKAREM FARHAis a 76y.o. female who has above history reviewed by me today presents for follow up visit for management of possible recurrent lung cancer.  History of cervix cancer.   Patient reports feeling well today.  No new complaints.  Weight has been stable.  Denies any nausea vomiting diarrhea, shortness of  breath.  Review of Systems  Constitutional:  Positive for fatigue. Negative for appetite change, chills, fever and unexpected weight change.  HENT:   Positive for hearing loss. Negative for voice change.   Eyes:  Negative for eye problems.  Respiratory:  Negative for chest tightness and cough.   Cardiovascular:  Negative for chest pain.  Gastrointestinal:  Negative for abdominal distention, abdominal pain and blood in stool.  Endocrine: Negative for hot flashes.  Genitourinary:  Negative for difficulty urinating and frequency.   Musculoskeletal:  Negative for arthralgias.  Skin:  Negative for itching and rash.  Neurological:  Negative for extremity weakness.  Hematological:  Negative for adenopathy.  Psychiatric/Behavioral:  Negative for confusion.     MEDICAL HISTORY:  Past Medical History:  Diagnosis Date   Arthritis    Cancer ([place]    Carotid atherosclerosis    Carotid bruit    Cervical cancer (HCC)    COPD (chronic obstructive pulmonary disease) (HCC)    emphysema   Coronary artery disease    Family history of adverse reaction to anesthesia    paternal grandfather died during surgery-pt unaware what happened   GERD (gastroesophageal reflux disease)    Hyperlipidemia    Hypertension    Hyponatremia 02/27/2019   Hypothyroidism    Infected cat bite 1980s   was hospitalized   Irregular heartbeat    Medical history non-contributory    Peripheral vascular disease ([place]    Tobacco use     SURGICAL HISTORY: Past Surgical History:  Procedure Laterality Date   CAROTID ENDARTERECTOMY Right Oct. 2015   Dr. DLucky Cowboy  CAROTID STENOSIS Left April 2016   carotid stenosis surgery   COLONOSCOPY WITH PROPOFOL N/A 11/08/2016   Procedure: COLONOSCOPY WITH PROPOFOL;  Surgeon: AJonathon Bellows MD;  [facility];  Service: Gastroenterology;  Laterality: N/A;   ESOPHAGOGASTRODUODENOSCOPY (EGD) WITH PROPOFOL N/A 11/08/2016   Procedure: ESOPHAGOGASTRODUODENOSCOPY (EGD) WITH  PROPOFOL;  Surgeon: AJonathon Bellows  MD;  Location: [facility] ENDOSCOPY;  Service: Gastroenterology;  Laterality: N/A;   FLEXIBLE BRONCHOSCOPY N/A 12/17/2014   Procedure: FLEXIBLE BRONCHOSCOPY;  Surgeon: Wilhelmina Mcardle, MD;  Location: [facility] ORS;  Service: Pulmonary;  Laterality: N/A;   INCISION AND DRAINAGE / EXCISION THYROGLOSSAL CYST  April 2011   PORTA CATH INSERTION N/A 03/03/2021   Procedure: PORTA CATH INSERTION;  Surgeon: Algernon Huxley, MD;  Location: [facility];  Service: Cardiovascular;  Laterality: N/A;   VIDEO BRONCHOSCOPY WITH ENDOBRONCHIAL ULTRASOUND N/A 01/12/2021   Procedure: VIDEO BRONCHOSCOPY WITH ENDOBRONCHIAL ULTRASOUND;  Surgeon: Tyler Pita, MD;  Location: [facility] ORS;  Service: Cardiopulmonary;  Laterality: N/A;    SOCIAL HISTORY: Social History   Socioeconomic History   Marital status: Married    Spouse name: Deidre Ala   Number of children: Not on file   Years of education: Not on file   Highest education level: 9th grade  Occupational History   Occupation: retired   Tobacco Use   Smoking status: Every Day    Packs/day: 0.50    Years: 50.00    Total pack years: 25.00    Types: Cigarettes   Smokeless tobacco: Never   Tobacco comments:    0.5 PPD 02/10/2021  Vaping Use   Vaping Use: Former  Substance and Sexual Activity   Alcohol use: No    Alcohol/week: 0.0 standard drinks of alcohol   Drug use: No   Sexual activity: Yes  Other Topics Concern   Not on file  Social History Narrative   Lives at home with husband   Social Determinants of Health   Financial Resource Strain: Low Risk  (03/15/2021)   Overall Financial Resource Strain (CARDIA)    Difficulty of Paying Living Expenses: Not hard at all  Food Insecurity: No Food Insecurity (03/15/2021)   Hunger Vital Sign    Worried About Running Out of Food in the Last Year: Never true    [place] in the Last Year: Never true  Transportation Needs: No Transportation Needs (03/15/2021)   PRAPARE -  Hydrologist (Medical): No    Lack of Transportation (Non-Medical): No  Physical Activity: Inactive (03/15/2021)   Exercise Vital Sign    Days of Exercise per Week: 0 days    Minutes of Exercise per Session: 0 min  Stress: No Stress Concern Present (03/15/2021)   [place]    Feeling of Stress : Not at all  Social Connections: Moderately Isolated (03/15/2021)   Social Connection and Isolation Panel [NHANES]    Frequency of Communication with Friends and Family: More than three times a week    Frequency of Social Gatherings with Friends and Family: More than three times a week    Attends Religious Services: Never    Marine scientist or Organizations: No    Attends Archivist Meetings: Never    Marital Status: Married  Human resources officer Violence: Not At Risk (03/15/2021)   Humiliation, Afraid, Rape, and Kick questionnaire    Fear of Current or Ex-Partner: No    Emotionally Abused: No    Physically Abused: No    Sexually Abused: No    FAMILY HISTORY: Family History  Problem Relation Age of Onset   Congestive Heart Failure Mother    Heart disease Mother    Hypertension Mother    Hypertension Sister    Diabetes Sister    Heart disease Sister  Heart disease Maternal Uncle    Heart disease Maternal Grandmother    Stroke Maternal Grandfather    Cancer Brother        liver, lung   Heart disease Brother    Hypertension Brother    Heart attack Brother    COPD Neg Hx    Breast cancer Neg Hx     ALLERGIES:  is allergic to atorvastatin.  MEDICATIONS:  Current Outpatient Medications  Medication Sig Dispense Refill   acetaminophen (TYLENOL) 500 MG tablet Take 500 mg by mouth every 6 (six) hours as needed for moderate pain.     alendronate (FOSAMAX) 70 MG tablet Take 1 tablet (70 mg total) by mouth every 7 (seven) days. Take with a full glass of water on an empty stomach. 12  tablet 3   chlorhexidine (PERIDEX) 0.12 % solution Use as directed 5 mLs in the mouth or throat 2 (two) times daily. 120 mL 0   clopidogrel (PLAVIX) 75 MG tablet Take 1 tablet (75 mg total) by mouth daily. 90 tablet 3   cyclobenzaprine (FLEXERIL) 10 MG tablet TAKE 1 TABLET AT BEDTIME 60 tablet 3   ferrous sulfate 325 (65 FE) MG EC tablet Take 1 tablet (325 mg total) by mouth daily with breakfast. 90 tablet 4   Fluticasone-Umeclidin-Vilant (TRELEGY ELLIPTA) 100-62.5-25 MCG/INH AEPB Inhale 1 puff into the lungs daily. 1 each 11   gabapentin (NEURONTIN) 100 MG capsule TAKE 1 CAPSULE THREE TIMES DAILY 270 capsule 0   levothyroxine (SYNTHROID) 88 MCG tablet Take 1 tablet (88 mcg total) by mouth daily. 90 tablet 3   lidocaine (LIDODERM) 5 % Place 1 patch onto the skin daily. Remove & Discard patch within 12 hours or as directed by MD 90 patch 1   lisinopril (ZESTRIL) 20 MG tablet TAKE 1 TABLET EVERY DAY 90 tablet 0   Multiple Vitamin (MULTIVITAMIN) tablet Take 1 tablet by mouth daily.     naproxen (NAPROSYN) 500 MG tablet TAKE 1 TABLET TWICE DAILY WITH MEALS 180 tablet 0   omeprazole (PRILOSEC) 20 MG capsule TAKE 1 CAPSULE EVERY DAY 90 capsule 2   vitamin B-12 (CYANOCOBALAMIN) 500 MCG tablet Take 500 mcg by mouth daily.     lidocaine-prilocaine (EMLA) cream Apply 1 application topically as needed. (Patient not taking: Reported on 06/03/2021) 60 g 3   ondansetron (ZOFRAN) 8 MG tablet Take 1 tablet (8 mg total) by mouth 2 (two) times daily as needed for refractory nausea / vomiting. Start on day 3 after chemo. (Patient not taking: Reported on 06/03/2021) 30 tablet 1   No current facility-administered medications for this visit.     PHYSICAL EXAMINATION: ECOG PERFORMANCE STATUS: 1 - Symptomatic but completely ambulatory Vitals:   07/15/21 1007  BP: (!) 146/64  Pulse: 92  Temp: (!) 96.3 F (35.7 C)   Filed Weights   07/15/21 1007  Weight: 137 lb (62.1 kg)    Physical Exam Constitutional:       General: She is not in acute distress. HENT:     Head: Normocephalic and atraumatic.  Eyes:     General: No scleral icterus.    Pupils: Pupils are equal, round, and reactive to light.  Cardiovascular:     Rate and Rhythm: Normal rate and regular rhythm.     Heart sounds: Normal heart sounds.  Pulmonary:     Effort: Pulmonary effort is normal. No respiratory distress.     Breath sounds: No wheezing.     Comments: Decreased breath sound bilaterally  Abdominal:     General: Bowel sounds are normal. There is no distension.     Palpations: Abdomen is soft. There is no mass.     Tenderness: There is no abdominal tenderness.  Musculoskeletal:        General: No deformity. Normal range of motion.     Cervical back: Normal range of motion and neck supple.     Comments: Trace edema bilaterally  Skin:    General: Skin is warm and dry.     Findings: No erythema or rash.  Neurological:     Mental Status: She is alert and oriented to person, place, and time. Mental status is at baseline.     Cranial Nerves: No cranial nerve deficit.     Coordination: Coordination normal.  Psychiatric:        Mood and Affect: Mood normal.    LABORATORY DATA:  I have reviewed the data as listed Lab Results  Component Value Date   WBC 6.5 07/15/2021   HGB 10.6 (L) 07/15/2021   HCT 31.2 (L) 07/15/2021   MCV 96.6 07/15/2021   PLT 252 07/15/2021   Recent Labs    06/17/21 0947 07/01/21 0958 07/15/21 0958  NA 132* 132* 133*  K 4.5 5.3* 4.2  CL 103 103 102  CO2 _0 GLUCOSE 108* 105* 117*  BUN 20 28* 22  CREATININE 0.96 0.98 1.13*  CALCIUM 8.8* 8.6* 8.8*  GFRNONAA >60 >60 51*  PROT 6.6 6.5 6.5  ALBUMIN 3.6 3.4* 3.5  AST _1 ALT _2 ALKPHOS 59 62 59  BILITOT 0.8 0.4 0.6    Iron/TIBC/Ferritin/ %Sat    Component Value Date/Time   IRON 90 05/04/2021 0929   TIBC 321 05/04/2021 0929   FERRITIN 52 05/04/2021 0929   IRONPCTSAT 28 05/04/2021 0929      RADIOGRAPHIC  STUDIES: I have personally reviewed the radiological images as listed and agreed with the findings in the report. CT Chest W Contrast  Result Date: 04/26/2021 CLINICAL DATA:  Non-small cell right lung cancer status post radiation therapy. Restaging. Additional history of cervical cancer. * Tracking Code: BO * EXAM: CT CHEST WITH CONTRAST TECHNIQUE: Multidetector CT imaging of the chest was performed during intravenous contrast administration. RADIATION DOSE REDUCTION: This exam was performed according to the departmental dose-optimization program which includes automated exposure control, adjustment of the mA and/or kV according to patient size and/or use of iterative reconstruction technique. CONTRAST:  55m OMNIPAQUE IOHEXOL 300 MG/ML  SOLN COMPARISON:  12/13/2020 PET-CT.  11/18/2020 chest CT. FINDINGS: Cardiovascular: Normal heart size. No significant pericardial effusion/thickening. Three-vessel coronary atherosclerosis. Left internal jugular Port-A-Cath terminates at the cavoatrial junction. Atherosclerotic nonaneurysmal thoracic aorta. Normal caliber pulmonary arteries. No central pulmonary emboli. Mediastinum/Nodes: No discrete thyroid nodules. Unremarkable esophagus. No axillary adenopathy. Previously enlarged 1.6 cm short axis right paratracheal node is decreased to 0.6 cm (series 2/image 54). Previously enlarged 1.0 cm AP window node is decreased to 0.8 cm (series 2/image 59). Previously enlarged 1.0 cm right hilar node is stable (series 2/image 66). No new pathologically enlarged mediastinal or hilar nodes. Lungs/Pleura: No pneumothorax. No pleural effusion. Severe centrilobular emphysema with mild diffuse bronchial wall thickening. Tiny posterior left lower lobe solid 0.2 cm pulmonary nodule (series 3/image 101), slightly decreased from 0.3 cm on 11/18/2020 chest CT. New triangular subpleural opacity in the posterior right lower lobe (series 3/image 100), favor focal atelectasis. Sharply marginated  patchy consolidation, reticulation and ground-glass opacity in the  posterior perifissural upper right lung (series 3/image 54), increased, compatible with evolving post radiation change, without discrete residual measurable nodule in this location. No new significant pulmonary nodules. Upper abdomen: No acute abnormality. Musculoskeletal: No aggressive appearing focal osseous lesions. Moderate thoracic spondylosis. IMPRESSION: 1. Evolving post radiation change in the posterior perifissural upper right lung, without discrete residual measurable nodule in this location. 2. Mild right hilar and mediastinal lymphadenopathy is stable to decreased. Tiny 0.2 cm left lower lobe pulmonary nodule is slightly decreased in size. Findings are compatible with positive response to therapy. 3. No new or progressive metastatic disease in the chest. 4. Three-vessel coronary atherosclerosis. 5. Aortic Atherosclerosis (ICD10-I70.0) and Emphysema (ICD10-J43.9). Electronically Signed   By: Ilona Sorrel M.D.   On: 04/26/2021 14:30

## 2021-07-15 NOTE — Assessment & Plan Note (Signed)
Locally advanced cervical cancer stage IIIC1 with pelvic nodes involvement. S/p concurrent chemotherapy and radiation followed by brachytherapy.  She is not actively following up with gynecology oncology.  I recommend patient to reestablish care.

## 2021-07-25 ENCOUNTER — Ambulatory Visit
Admission: RE | Admit: 2021-07-25 | Discharge: 2021-07-25 | Disposition: A | Discharge status: Home / Self Care | Payer: Medicare PPO | Source: Ambulatory Visit | Attending: Oncology | Admitting: Oncology

## 2021-07-25 DIAGNOSIS — C3491 Malignant neoplasm of unspecified part of right bronchus or lung: Secondary | ICD-10-CM | POA: Insufficient documentation

## 2021-07-25 DIAGNOSIS — J432 Centrilobular emphysema: Secondary | ICD-10-CM | POA: Diagnosis not present

## 2021-07-25 DIAGNOSIS — J8410 Pulmonary fibrosis, unspecified: Secondary | ICD-10-CM | POA: Diagnosis not present

## 2021-07-25 DIAGNOSIS — C3490 Malignant neoplasm of unspecified part of unspecified bronchus or lung: Secondary | ICD-10-CM | POA: Diagnosis not present

## 2021-07-25 MED ORDER — IOHEXOL 300 MG/ML  SOLN
75.0000 mL | Freq: Once | INTRAMUSCULAR | Status: AC | PRN
Start: 2021-07-25 — End: 2021-07-25
  Administered 2021-07-25: 75 mL via INTRAVENOUS

## 2021-07-29 ENCOUNTER — Inpatient Hospital Stay (HOSPITAL_BASED_OUTPATIENT_CLINIC_OR_DEPARTMENT_OTHER): Payer: Medicare PPO | Admitting: Oncology

## 2021-07-29 ENCOUNTER — Inpatient Hospital Stay: Payer: Medicare PPO

## 2021-07-29 ENCOUNTER — Encounter: Payer: Self-pay | Admitting: Oncology

## 2021-07-29 DIAGNOSIS — C3491 Malignant neoplasm of unspecified part of right bronchus or lung: Secondary | ICD-10-CM | POA: Diagnosis not present

## 2021-07-29 DIAGNOSIS — Z720 Tobacco use: Secondary | ICD-10-CM

## 2021-07-29 DIAGNOSIS — C539 Malignant neoplasm of cervix uteri, unspecified: Secondary | ICD-10-CM

## 2021-07-29 DIAGNOSIS — Z5112 Encounter for antineoplastic immunotherapy: Secondary | ICD-10-CM | POA: Diagnosis not present

## 2021-07-29 DIAGNOSIS — Z9221 Personal history of antineoplastic chemotherapy: Secondary | ICD-10-CM | POA: Diagnosis not present

## 2021-07-29 DIAGNOSIS — Z79899 Other long term (current) drug therapy: Secondary | ICD-10-CM | POA: Diagnosis not present

## 2021-07-29 DIAGNOSIS — Z923 Personal history of irradiation: Secondary | ICD-10-CM | POA: Diagnosis not present

## 2021-07-29 LAB — CBC WITH DIFFERENTIAL/PLATELET
Abs Immature Granulocytes: 0.04 10*3/uL (ref 0.00–0.07)
Basophils Absolute: 0 10*3/uL (ref 0.0–0.1)
Basophils Relative: 1 %
Eosinophils Absolute: 0.3 10*3/uL (ref 0.0–0.5)
Eosinophils Relative: 3 %
HCT: 30.2 % — ABNORMAL LOW (ref 36.0–46.0)
Hemoglobin: 10.2 g/dL — ABNORMAL LOW (ref 12.0–15.0)
Immature Granulocytes: 1 %
Lymphocytes Relative: 8 %
Lymphs Abs: 0.6 10*3/uL — ABNORMAL LOW (ref 0.7–4.0)
MCH: 32.8 pg (ref 26.0–34.0)
MCHC: 33.8 g/dL (ref 30.0–36.0)
MCV: 97.1 fL (ref 80.0–100.0)
Monocytes Absolute: 0.6 10*3/uL (ref 0.1–1.0)
Monocytes Relative: 8 %
Neutro Abs: 5.9 10*3/uL (ref 1.7–7.7)
Neutrophils Relative %: 79 %
Platelets: 269 10*3/uL (ref 150–400)
RBC: 3.11 MIL/uL — ABNORMAL LOW (ref 3.87–5.11)
RDW: 12 % (ref 11.5–15.5)
WBC: 7.5 10*3/uL (ref 4.0–10.5)
nRBC: 0 % (ref 0.0–0.2)

## 2021-07-29 LAB — COMPREHENSIVE METABOLIC PANEL
ALT: 11 U/L (ref 0–44)
AST: 20 U/L (ref 15–41)
Albumin: 3.5 g/dL (ref 3.5–5.0)
Alkaline Phosphatase: 58 U/L (ref 38–126)
Anion gap: 8 (ref 5–15)
BUN: 28 mg/dL — ABNORMAL HIGH (ref 8–23)
CO2: 20 mmol/L — ABNORMAL LOW (ref 22–32)
Calcium: 8.6 mg/dL — ABNORMAL LOW (ref 8.9–10.3)
Chloride: 104 mmol/L (ref 98–111)
Creatinine, Ser: 1.13 mg/dL — ABNORMAL HIGH (ref 0.44–1.00)
GFR, Estimated: 51 mL/min — ABNORMAL LOW (ref >60)
Glucose, Bld: 128 mg/dL — ABNORMAL HIGH (ref 70–99)
Potassium: 4.6 mmol/L (ref 3.5–5.1)
Sodium: 132 mmol/L — ABNORMAL LOW (ref 135–145)
Total Bilirubin: 0.7 mg/dL (ref 0.3–1.2)
Total Protein: 6.2 g/dL — ABNORMAL LOW (ref 6.5–8.1)

## 2021-07-29 MED ORDER — SODIUM CHLORIDE 0.9 % IV SOLN
10.0000 mg/kg | Freq: Once | INTRAVENOUS | Status: AC
  Administered 2021-07-29: 620 mg via INTRAVENOUS
  Filled 2021-07-29: qty 2.4

## 2021-07-29 MED ORDER — SODIUM CHLORIDE 0.9 % IV SOLN
Freq: Once | INTRAVENOUS | Status: AC
  Filled 2021-07-29: qty 250

## 2021-07-29 MED ORDER — HEPARIN SOD (PORK) LOCK FLUSH 100 UNIT/ML IV SOLN
500.0000 [IU] | Freq: Once | INTRAVENOUS | Status: AC | PRN
  Administered 2021-07-29: 500 [IU]
  Filled 2021-07-29: qty 5

## 2021-07-29 NOTE — Patient Instructions (Signed)
[facility] AT [place]  Discharge Instructions: Thank you for choosing [facility] to provide your oncology and hematology care.  If you have a lab appointment with the [facility], please go directly to the [facility] and check in at the registration area.  Wear comfortable clothing and clothing appropriate for easy access to any Portacath or PICC line.   We strive to give you quality time with your provider. You may need to reschedule your appointment if you arrive late (15 or more minutes).  Arriving late affects you and other patients whose appointments are after yours.  Also, if you miss three or more appointments without notifying the office, you may be dismissed from the clinic at the provider's discretion.      For prescription refill requests, have your pharmacy contact our office and allow 72 hours for refills to be completed.    Today you received the following chemotherapy and/or immunotherapy agents Imfinzi      To help prevent nausea and vomiting after your treatment, we encourage you to take your nausea medication as directed.  BELOW ARE SYMPTOMS THAT SHOULD BE REPORTED IMMEDIATELY: *FEVER GREATER THAN 100.4 F (38 C) OR HIGHER *CHILLS OR SWEATING *NAUSEA AND VOMITING THAT IS NOT CONTROLLED WITH YOUR NAUSEA MEDICATION *UNUSUAL SHORTNESS OF BREATH *UNUSUAL BRUISING OR BLEEDING *URINARY PROBLEMS (pain or burning when urinating, or frequent urination) *BOWEL PROBLEMS (unusual diarrhea, constipation, pain near the anus) TENDERNESS IN MOUTH AND THROAT WITH OR WITHOUT PRESENCE OF ULCERS (sore throat, sores in mouth, or a toothache) UNUSUAL RASH, SWELLING OR PAIN  UNUSUAL VAGINAL DISCHARGE OR ITCHING   Items with * indicate a potential emergency and should be followed up as soon as possible or go to the Emergency Department if any problems should occur.  Please show the CHEMOTHERAPY ALERT CARD or IMMUNOTHERAPY ALERT CARD at check-in to the  Emergency Department and triage nurse.  Should you have questions after your visit or need to cancel or reschedule your appointment, please contact [facility] CANCER [place] AT [place]  [phone] and follow the prompts.  Office hours are 8:00 a.m. to 4:30 p.m. Monday - Friday. Please note that voicemails left after 4:00 p.m. may not be returned until the following business day.  We are closed weekends and major holidays. You have access to a nurse at all times for urgent questions. Please call the main number to the clinic [phone] and follow the prompts.  For any non-urgent questions, you may also contact your provider using MyChart. We now offer e-Visits for anyone 32 and older to request care online for non-urgent symptoms. For details visit [email or web address].   Also download the MyChart app! Go to the app store, search "MyChart", open the app, select [facility], and log in with your MyChart username and password.  Masks are optional in the cancer centers. If you would like for your care team to wear a mask while they are taking care of you, please let them know. For doctor visits, patients may have with them one support person who is at least 74 years old. At this time, visitors are not allowed in the infusion area.

## 2021-07-29 NOTE — Assessment & Plan Note (Signed)
Locally advanced cervical cancer stage IIIC1 with pelvic nodes involvement. S/p concurrent chemotherapy and radiation followed by brachytherapy.  She is not actively following up with gynecology oncology.  Encourage patient to reestablish care to follow-up

## 2021-07-29 NOTE — Progress Notes (Signed)
Pt here for follow up. No new concerns voiced.   

## 2021-07-29 NOTE — Assessment & Plan Note (Signed)
Stage III right lung adenocarcinoma,  Recurrent S/p concurrent chemotherapy and radiation-immunotherapy durvalumab maintenance Labs reviewed and discussed with patient.  Proceed with Durvalumab treatments. Plan repeat CT chest surveillance scan at the end of June.

## 2021-07-30 ENCOUNTER — Encounter: Payer: Self-pay | Admitting: Oncology

## 2021-07-30 NOTE — Assessment & Plan Note (Signed)
Treatment plan was listed above

## 2021-07-30 NOTE — Progress Notes (Signed)
Hematology/Oncology Progress note [phone] [phone]      Patient Care Team: [name], DO as PCP - General (Family Medicine) Clent Jacks, RN as Oncology Nurse Navigator Noreene Filbert, MD as Radiation Oncologist (Radiation Oncology) Telford Nab, RN as Oncology Nurse Navigator [name], MD as Consulting Physician (Oncology)  ASSESSMENT & PLAN:   Primary lung adenocarcinoma [facility]) Stage III right lung adenocarcinoma,  Recurrent S/p concurrent chemotherapy and radiation-immunotherapy durvalumab maintenance Labs reviewed and discussed with patient.  Proceed with Durvalumab treatments. Images were independent reviewed by me and discussed with patient.  CT showed no signs of cancer recurrence.  Slightly enlarged lung nodules, attention on follow-up.  Cervical cancer ([place]) Locally advanced cervical cancer stage IIIC1 with pelvic nodes involvement. S/p concurrent chemotherapy and radiation followed by brachytherapy.  She is not actively following up with gynecology oncology.  Encourage patient to reestablish care to follow-up  Encounter for antineoplastic immunotherapy Treatment plan was listed above  Tobacco use Recommend smoke cessation.  No orders of the defined types were placed in this encounter.  Lab md 2 weeks durvalumab All questions were answered. The patient knows to call the clinic with any problems, questions or concerns.  [name], MD, PhD [facility] Hematology Oncology 07/29/2021     CHIEF COMPLAINTS/REASON FOR VISIT:  Follow up lung cancer treatments  HISTORY OF PRESENTING ILLNESS:  # Cervix cancer Patient has developed postmenopausal vaginal bleeding and discharge.  She was noted to have a friable cervix/2 cm mass of the cervix, concerning for malignancy 12/19/2018 endometrial biopsy showed scattered atypical squamous cells suspicious for malignancy.  Predominantly necrosis with associated inflammation. 01/01/2019  initial cervix biopsy showed at least high-grade squamous intraepithelial lesion.-HPV negative 01/22/2019, repeat vaginal wall biopsy and cervix 9:00 biopsy showed squamous cell carcinoma.    Staging images 12/31/2018 showed fluid distending the endometrial canal and or endometrial thickening to the level of cervix.  No evidence of pathological lymphadenopathy.  Haziness about the lower cervix.  7 mm irregular pulmonary nodule within the right upper lobe, suspicious for possible primary or metastatic malignancy.  Chronic pleural-parenchymal scarring/fibrosis at the lung apices.  Large amount of stool.  No bowel obstruction. 01/08/2019 PET scan showed marked hypermetabolism in the region of the cervix, compatible with the reported history of cervical cancer. Small bilateral pelvic sidewall lymph nodes discernible FDG accumulation, concerning for metastatic disease although neither lymph node is enlarged by CT size criteria. 7 mm nodule in the right upper lobe shows discernible FDG accumulation.  Questionable neoplasm, primary versus metastatic. Emphysema.   #Chronic hearing loss  # Lung nodule, FDG avid, questionable primary bronchogenic carcinoma versus metastatic cancer.Discussed with radiation oncology.  Her case was also discussed on tumor board.  Consensus reached on finishing concurrent chemoradiation treatments for cervix cancer first. Possible SBRT to lung nodule for presumed primary lung cancer.  # 02/20/2020 - 03/19/2020 concurrent chemotherapy weekly carboplatin and taxol and radiation for treatment of this cervix cancer.   #December 2021 lung nodule,was evaluated by Dr.Oaks. Options of biopsy via transthoracic or endobronchial approach vs surgical resection.  Patient opted to empiric SBRT  Patient was last seen by me on 06/25/2019.  Lost follow-up. She continues to follow with radiation oncology. 11/20/2020, CT chest with contrast showed minimal residual of previously seen right upper lobe  nodule.  0.6 x 0.4 cm.  Internal development of mild bandlike radiation fibrosis.  Newly enlarged pretracheal lymph node, measuring 2.5 x 1.7 cm.  Highly concerning for metastatic disease.  Unchanged prominent AP window and the right hilar lymph nodes.  Emphysema and diffuse bilateral bronchial wall thickening.  Coronary artery disease. 12/13/2020, PET scan showed enlarging hypermetabolic right lower paratracheal lymph node, compatible with metastatic disease.  SUV 10.3.  Mild FDG uptake of the bilateral sacral alla with associated lucency.  Concerning for sacral insufficiency fracture.  Stable posttreatment changes of the right upper lobe nodule.  Nonspecific small solid 3 mm pulmonary nodule of the left lower lobe.  2 small to be characterized.  No evidence of FDG avid metastatic disease in the abdomen or pelvis.  Aortic atherosclerosis and emphysema.  Patient reestablish care on 12/27/2020 for lung cancer treatments   #01/12/2021 patient underwent bronchoscopy biopsy by Dr. Patsey Berthold Right paratracheal lymph node fine-needle aspiration is positive for metastatic non-small cell carcinoma, favor adenocarcinoma of lung origin.  Circulogen NGS negative PD-L1, ALK, ROS1, NTRK1/2/3,   # 02/14/2021-03/31/2021, concurrent carboplatin AUC 2 and taxol 31m/m2 with radiation.  04/26/2021, CT chest with contrast showed evolving postradiation changes in the posterior perifissural upper right lung without discrete residual measurable nodule in this location.  Right hilar/mediastinal lymphadenopathy stable to decreased.  Tiny 0.2 cm left lower lobe pulmonary nodule slightly decreased.  Positive response to treatment.  No new or progressive disease.  Three-vessel coronary arthrosclerosis.  Aortic atherosclerosis/emphysema. INTERVAL HISTORY Mary BALDASSARIis a 76y.o. female who has above history reviewed by me today presents for follow up visit for management of possible recurrent lung cancer.  History of cervix  cancer.   Patient reports feeling well today.  Patient has no new complaints .  She continues smoking. Review of Systems  Constitutional:  Positive for fatigue. Negative for appetite change, chills, fever and unexpected weight change.  HENT:   Positive for hearing loss. Negative for voice change.   Eyes:  Negative for eye problems.  Respiratory:  Negative for chest tightness and cough.   Cardiovascular:  Negative for chest pain.  Gastrointestinal:  Negative for abdominal distention, abdominal pain and blood in stool.  Endocrine: Negative for hot flashes.  Genitourinary:  Negative for difficulty urinating and frequency.   Musculoskeletal:  Negative for arthralgias.  Skin:  Negative for itching and rash.  Neurological:  Negative for extremity weakness.  Hematological:  Negative for adenopathy.  Psychiatric/Behavioral:  Negative for confusion.     MEDICAL HISTORY:  Past Medical History:  Diagnosis Date   Arthritis    Cancer ([place]    Carotid atherosclerosis    Carotid bruit    Cervical cancer (HCC)    COPD (chronic obstructive pulmonary disease) (HCC)    emphysema   Coronary artery disease    Family history of adverse reaction to anesthesia    paternal grandfather died during surgery-pt unaware what happened   GERD (gastroesophageal reflux disease)    Hyperlipidemia    Hypertension    Hyponatremia 02/27/2019   Hypothyroidism    Infected cat bite 1980s   was hospitalized   Irregular heartbeat    Medical history non-contributory    Peripheral vascular disease [place]    Tobacco use     SURGICAL HISTORY: Past Surgical History:  Procedure Laterality Date   CAROTID ENDARTERECTOMY Right Oct. 2015   Dr. DLucky Cowboy  CAROTID STENOSIS Left April 2016   carotid stenosis surgery   COLONOSCOPY WITH PROPOFOL N/A 11/08/2016   Procedure: COLONOSCOPY WITH PROPOFOL;  Surgeon: AJonathon Bellows MD;  [facility];  Service: Gastroenterology;  Laterality: N/A;   ESOPHAGOGASTRODUODENOSCOPY  (EGD) WITH PROPOFOL N/A  11/08/2016   Procedure: ESOPHAGOGASTRODUODENOSCOPY (EGD) WITH PROPOFOL;  Surgeon: Jonathon Bellows, MD;  [facility];  Service: Gastroenterology;  Laterality: N/A;   FLEXIBLE BRONCHOSCOPY N/A 12/17/2014   Procedure: FLEXIBLE BRONCHOSCOPY;  Surgeon: Wilhelmina Mcardle, MD;  Location: [facility] ORS;  Service: Pulmonary;  Laterality: N/A;   INCISION AND DRAINAGE / EXCISION THYROGLOSSAL CYST  April 2011   PORTA CATH INSERTION N/A 03/03/2021   Procedure: PORTA CATH INSERTION;  Surgeon: Algernon Huxley, MD;  Location: [place] [facility] LAB;  Service: Cardiovascular;  Laterality: N/A;   VIDEO BRONCHOSCOPY WITH ENDOBRONCHIAL ULTRASOUND N/A 01/12/2021   Procedure: VIDEO BRONCHOSCOPY WITH ENDOBRONCHIAL ULTRASOUND;  Surgeon: Tyler Pita, MD;  Location: [facility] ORS;  Service: Cardiopulmonary;  Laterality: N/A;    SOCIAL HISTORY: Social History   Socioeconomic History   Marital status: Married    Spouse name: Deidre Ala   Number of children: Not on file   Years of education: Not on file   Highest education level: 9th grade  Occupational History   Occupation: retired   Tobacco Use   Smoking status: Every Day    Packs/day: 0.50    Years: 50.00    Total pack years: 25.00    Types: Cigarettes   Smokeless tobacco: Never   Tobacco comments:    0.5 PPD 02/10/2021  Vaping Use   Vaping Use: Former  Substance and Sexual Activity   Alcohol use: No    Alcohol/week: 0.0 standard drinks of alcohol   Drug use: No   Sexual activity: Yes  Other Topics Concern   Not on file  Social History Narrative   Lives at home with husband   Social Determinants of Health   Financial Resource Strain: Low Risk  (03/15/2021)   Overall Financial Resource Strain (CARDIA)    Difficulty of Paying Living Expenses: Not hard at all  Food Insecurity: No Food Insecurity (03/15/2021)   Hunger Vital Sign    Worried About Running Out of Food in the Last Year: Never true    [place] in the Last Year:  Never true  Transportation Needs: No Transportation Needs (03/15/2021)   PRAPARE - Hydrologist (Medical): No    Lack of Transportation (Non-Medical): No  Physical Activity: Inactive (03/15/2021)   Exercise Vital Sign    Days of Exercise per Week: 0 days    Minutes of Exercise per Session: 0 min  Stress: No Stress Concern Present (03/15/2021)   [place]    Feeling of Stress : Not at all  Social Connections: Moderately Isolated (03/15/2021)   Social Connection and Isolation Panel [NHANES]    Frequency of Communication with Friends and Family: More than three times a week    Frequency of Social Gatherings with Friends and Family: More than three times a week    Attends Religious Services: Never    Marine scientist or Organizations: No    Attends Archivist Meetings: Never    Marital Status: Married  Human resources officer Violence: Not At Risk (03/15/2021)   Humiliation, Afraid, Rape, and Kick questionnaire    Fear of Current or Ex-Partner: No    Emotionally Abused: No    Physically Abused: No    Sexually Abused: No    FAMILY HISTORY: Family History  Problem Relation Age of Onset   Congestive Heart Failure Mother    Heart disease Mother    Hypertension Mother    Hypertension Sister  Diabetes Sister    Heart disease Sister    Heart disease Maternal Uncle    Heart disease Maternal Grandmother    Stroke Maternal Grandfather    Cancer Brother        liver, lung   Heart disease Brother    Hypertension Brother    Heart attack Brother    COPD Neg Hx    Breast cancer Neg Hx     ALLERGIES:  is allergic to atorvastatin.  MEDICATIONS:  Current Outpatient Medications  Medication Sig Dispense Refill   acetaminophen (TYLENOL) 500 MG tablet Take 500 mg by mouth every 6 (six) hours as needed for moderate pain.     alendronate (FOSAMAX) 70 MG tablet Take 1 tablet (70 mg total) by  mouth every 7 (seven) days. Take with a full glass of water on an empty stomach. 12 tablet 3   chlorhexidine (PERIDEX) 0.12 % solution Use as directed 5 mLs in the mouth or throat 2 (two) times daily. 120 mL 0   clopidogrel (PLAVIX) 75 MG tablet Take 1 tablet (75 mg total) by mouth daily. 90 tablet 3   cyclobenzaprine (FLEXERIL) 10 MG tablet TAKE 1 TABLET AT BEDTIME 60 tablet 3   ferrous sulfate 325 (65 FE) MG EC tablet Take 1 tablet (325 mg total) by mouth daily with breakfast. 90 tablet 4   Fluticasone-Umeclidin-Vilant (TRELEGY ELLIPTA) 100-62.5-25 MCG/INH AEPB Inhale 1 puff into the lungs daily. 1 each 11   gabapentin (NEURONTIN) 100 MG capsule TAKE 1 CAPSULE THREE TIMES DAILY 270 capsule 0   levothyroxine (SYNTHROID) 88 MCG tablet Take 1 tablet (88 mcg total) by mouth daily. 90 tablet 3   lidocaine (LIDODERM) 5 % Place 1 patch onto the skin daily. Remove & Discard patch within 12 hours or as directed by MD 90 patch 1   lidocaine-prilocaine (EMLA) cream Apply 1 application topically as needed. 60 g 3   lisinopril (ZESTRIL) 20 MG tablet TAKE 1 TABLET EVERY DAY 90 tablet 0   Multiple Vitamin (MULTIVITAMIN) tablet Take 1 tablet by mouth daily.     naproxen (NAPROSYN) 500 MG tablet TAKE 1 TABLET TWICE DAILY WITH MEALS 180 tablet 0   omeprazole (PRILOSEC) 20 MG capsule TAKE 1 CAPSULE EVERY DAY 90 capsule 2   ondansetron (ZOFRAN) 8 MG tablet Take 1 tablet (8 mg total) by mouth 2 (two) times daily as needed for refractory nausea / vomiting. Start on day 3 after chemo. 30 tablet 1   vitamin B-12 (CYANOCOBALAMIN) 500 MCG tablet Take 500 mcg by mouth daily.     No current facility-administered medications for this visit.     PHYSICAL EXAMINATION: ECOG PERFORMANCE STATUS: 1 - Symptomatic but completely ambulatory Vitals:   07/29/21 0838  BP: (!) 128/53  Pulse: 89  Resp: 18  Temp: (!) 97.2 F (36.2 C)   Filed Weights   07/29/21 0838  Weight: 138 lb 6.4 oz (62.8 kg)    Physical  Exam Constitutional:      General: She is not in acute distress. HENT:     Head: Normocephalic and atraumatic.  Eyes:     General: No scleral icterus.    Pupils: Pupils are equal, round, and reactive to light.  Cardiovascular:     Rate and Rhythm: Normal rate and regular rhythm.     Heart sounds: Normal heart sounds.  Pulmonary:     Effort: Pulmonary effort is normal. No respiratory distress.     Breath sounds: No wheezing.     Comments:  Decreased breath sound bilaterally  Abdominal:     General: Bowel sounds are normal. There is no distension.     Palpations: Abdomen is soft. There is no mass.     Tenderness: There is no abdominal tenderness.  Musculoskeletal:        General: No deformity. Normal range of motion.     Cervical back: Normal range of motion and neck supple.     Comments: Trace edema bilaterally  Skin:    General: Skin is warm and dry.     Findings: No erythema or rash.  Neurological:     Mental Status: She is alert and oriented to person, place, and time. Mental status is at baseline.     Cranial Nerves: No cranial nerve deficit.     Coordination: Coordination normal.  Psychiatric:        Mood and Affect: Mood normal.     LABORATORY DATA:  I have reviewed the data as listed Lab Results  Component Value Date   WBC 7.5 07/29/2021   HGB 10.2 (L) 07/29/2021   HCT 30.2 (L) 07/29/2021   MCV 97.1 07/29/2021   PLT 269 07/29/2021   Recent Labs    07/01/21 0958 07/15/21 0958 07/29/21 0826  NA 132* 133* 132*  K 5.3* 4.2 4.6  CL 103 102 104  CO2 23 22 20*  GLUCOSE 105* 117* 128*  BUN 28* 22 28*  CREATININE 0.98 1.13* 1.13*  CALCIUM 8.6* 8.8* 8.6*  GFRNONAA >60 51* 51*  PROT 6.5 6.5 6.2*  ALBUMIN 3.4* 3.5 3.5  AST _0 ALT _1 ALKPHOS 62 59 58  BILITOT 0.4 0.6 0.7    Iron/TIBC/Ferritin/ %Sat    Component Value Date/Time   IRON 90 05/04/2021 0929   TIBC 321 05/04/2021 0929   FERRITIN 52 05/04/2021 0929   IRONPCTSAT 28 05/04/2021  0929      RADIOGRAPHIC STUDIES: I have personally reviewed the radiological images as listed and agreed with the findings in the report. CT Chest W Contrast  Result Date: 07/25/2021 CLINICAL DATA:  Restaging lung cancer. No current complaints. * Tracking Code: BO * EXAM: CT CHEST WITH CONTRAST TECHNIQUE: Multidetector CT imaging of the chest was performed during intravenous contrast administration. RADIATION DOSE REDUCTION: This exam was performed according to the departmental dose-optimization program which includes automated exposure control, adjustment of the mA and/or kV according to patient size and/or use of iterative reconstruction technique. CONTRAST:  30m OMNIPAQUE IOHEXOL 300 MG/ML  SOLN COMPARISON:  Chest CT 04/25/2021 and 11/18/2020.  PET-CT 12/13/2020. FINDINGS: Cardiovascular: Left IJ Port-A-Cath extends to the upper right atrium. There is atherosclerosis of the aorta, great vessels and coronary arteries. No acute vascular findings. The heart size is normal. There is no pericardial effusion. Mediastinum/Nodes: Stable 9 mm right hilar node on image 58/2. No recurrent mediastinal, hilar or axillary adenopathy. The thyroid gland, trachea and esophagus demonstrate no significant findings. Lungs/Pleura: No pleural effusion or pneumothorax. Severe centrilobular paraseptal emphysema again noted with chronic radiation changes posteriorly in the right upper lobe and superior segment of the right lower lobe. 3 mm left lower lobe pulmonary nodule on image 96/3 appears slightly more prominent, although grossly unchanged from 11/18/2020. This measures up to 5 mm on sagittal image 131/6. Stable calcified right lower lobe granuloma. No other enlarging or new nodules identified. Upper abdomen: The visualized upper abdomen appears stable without significant findings. Musculoskeletal/Chest wall: There is no chest wall mass or suspicious osseous finding. Mild spondylosis.  IMPRESSION: 1. Questionable slight  enlargement a small left lower lobe nodule measuring up to 5 mm of the sagittal images compared with the most recent prior study. Attention on follow-up in approximately 6 months recommended. 2. Otherwise stable chest CT with stable radiation changes superiorly in the right hemithorax and small residual right hilar lymph node. 3. Coronary and aortic atherosclerosis (ICD10-I70.0). Emphysema (ICD10-J43.9). Electronically Signed   By: Richardean Sale M.D.   On: 07/25/2021 16:00

## 2021-07-30 NOTE — Assessment & Plan Note (Signed)
Recommend smoke cessation.  

## 2021-08-12 ENCOUNTER — Inpatient Hospital Stay: Payer: Medicare PPO | Attending: Oncology

## 2021-08-12 ENCOUNTER — Inpatient Hospital Stay: Payer: Medicare PPO

## 2021-08-12 VITALS — BP 130/57 | HR 90 | Temp 96.8°F | Resp 18 | Wt 138.4 lb

## 2021-08-12 DIAGNOSIS — Z79899 Other long term (current) drug therapy: Secondary | ICD-10-CM | POA: Diagnosis not present

## 2021-08-12 DIAGNOSIS — J432 Centrilobular emphysema: Secondary | ICD-10-CM | POA: Insufficient documentation

## 2021-08-12 DIAGNOSIS — C3491 Malignant neoplasm of unspecified part of right bronchus or lung: Secondary | ICD-10-CM | POA: Diagnosis not present

## 2021-08-12 DIAGNOSIS — C539 Malignant neoplasm of cervix uteri, unspecified: Secondary | ICD-10-CM | POA: Insufficient documentation

## 2021-08-12 DIAGNOSIS — I700 Atherosclerosis of aorta: Secondary | ICD-10-CM | POA: Insufficient documentation

## 2021-08-12 DIAGNOSIS — Z5112 Encounter for antineoplastic immunotherapy: Secondary | ICD-10-CM | POA: Insufficient documentation

## 2021-08-12 LAB — COMPREHENSIVE METABOLIC PANEL
ALT: 11 U/L (ref 0–44)
AST: 19 U/L (ref 15–41)
Albumin: 3.4 g/dL — ABNORMAL LOW (ref 3.5–5.0)
Alkaline Phosphatase: 63 U/L (ref 38–126)
Anion gap: 7 (ref 5–15)
BUN: 19 mg/dL (ref 8–23)
CO2: 22 mmol/L (ref 22–32)
Calcium: 8.4 mg/dL — ABNORMAL LOW (ref 8.9–10.3)
Chloride: 98 mmol/L (ref 98–111)
Creatinine, Ser: 0.97 mg/dL (ref 0.44–1.00)
GFR, Estimated: >60 mL/min (ref >60)
Glucose, Bld: 98 mg/dL (ref 70–99)
Potassium: 4.7 mmol/L (ref 3.5–5.1)
Sodium: 127 mmol/L — ABNORMAL LOW (ref 135–145)
Total Bilirubin: 0.6 mg/dL (ref 0.3–1.2)
Total Protein: 6.4 g/dL — ABNORMAL LOW (ref 6.5–8.1)

## 2021-08-12 LAB — CBC WITH DIFFERENTIAL/PLATELET
Abs Immature Granulocytes: 0.07 10*3/uL (ref 0.00–0.07)
Basophils Absolute: 0.1 10*3/uL (ref 0.0–0.1)
Basophils Relative: 1 %
Eosinophils Absolute: 0.3 10*3/uL (ref 0.0–0.5)
Eosinophils Relative: 4 %
HCT: 31.2 % — ABNORMAL LOW (ref 36.0–46.0)
Hemoglobin: 10.9 g/dL — ABNORMAL LOW (ref 12.0–15.0)
Immature Granulocytes: 1 %
Lymphocytes Relative: 7 %
Lymphs Abs: 0.6 10*3/uL — ABNORMAL LOW (ref 0.7–4.0)
MCH: 32.5 pg (ref 26.0–34.0)
MCHC: 34.9 g/dL (ref 30.0–36.0)
MCV: 93.1 fL (ref 80.0–100.0)
Monocytes Absolute: 0.5 10*3/uL (ref 0.1–1.0)
Monocytes Relative: 7 %
Neutro Abs: 6.5 10*3/uL (ref 1.7–7.7)
Neutrophils Relative %: 80 %
Platelets: 306 10*3/uL (ref 150–400)
RBC: 3.35 MIL/uL — ABNORMAL LOW (ref 3.87–5.11)
RDW: 12 % (ref 11.5–15.5)
WBC: 8.1 10*3/uL (ref 4.0–10.5)
nRBC: 0 % (ref 0.0–0.2)

## 2021-08-12 LAB — T4, FREE: Free T4: 1.05 ng/dL (ref 0.61–1.12)

## 2021-08-12 LAB — TSH: TSH: 0.93 u[IU]/mL (ref 0.350–4.500)

## 2021-08-12 MED ORDER — SODIUM CHLORIDE 0.9 % IV SOLN
10.0000 mg/kg | Freq: Once | INTRAVENOUS | Status: AC
  Administered 2021-08-12: 620 mg via INTRAVENOUS
  Filled 2021-08-12: qty 2.4

## 2021-08-12 MED ORDER — SODIUM CHLORIDE 0.9 % IV SOLN
Freq: Once | INTRAVENOUS | Status: AC
  Filled 2021-08-12: qty 250

## 2021-08-12 MED ORDER — SODIUM CHLORIDE 0.9% FLUSH
10.0000 mL | Freq: Once | INTRAVENOUS | Status: AC
  Administered 2021-08-12: 10 mL via INTRAVENOUS
  Filled 2021-08-12: qty 10

## 2021-08-12 MED ORDER — HEPARIN SOD (PORK) LOCK FLUSH 100 UNIT/ML IV SOLN
500.0000 [IU] | Freq: Once | INTRAVENOUS | Status: DC | PRN
  Filled 2021-08-12: qty 5

## 2021-08-12 MED ORDER — HEPARIN SOD (PORK) LOCK FLUSH 100 UNIT/ML IV SOLN
500.0000 [IU] | Freq: Once | INTRAVENOUS | Status: AC
  Administered 2021-08-12: 500 [IU] via INTRAVENOUS
  Filled 2021-08-12: qty 5

## 2021-08-12 NOTE — Patient Instructions (Signed)
[facility] AT [place]  Discharge Instructions: Thank you for choosing [facility] to provide your oncology and hematology care.  If you have a lab appointment with the [facility], please go directly to the [facility] and check in at the registration area.  Wear comfortable clothing and clothing appropriate for easy access to any Portacath or PICC line.   We strive to give you quality time with your provider. You may need to reschedule your appointment if you arrive late (15 or more minutes).  Arriving late affects you and other patients whose appointments are after yours.  Also, if you miss three or more appointments without notifying the office, you may be dismissed from the clinic at the provider's discretion.      For prescription refill requests, have your pharmacy contact our office and allow 72 hours for refills to be completed.    Today you received the following chemotherapy and/or immunotherapy agents Durvalumab      To help prevent nausea and vomiting after your treatment, we encourage you to take your nausea medication as directed.  BELOW ARE SYMPTOMS THAT SHOULD BE REPORTED IMMEDIATELY: *FEVER GREATER THAN 100.4 F (38 C) OR HIGHER *CHILLS OR SWEATING *NAUSEA AND VOMITING THAT IS NOT CONTROLLED WITH YOUR NAUSEA MEDICATION *UNUSUAL SHORTNESS OF BREATH *UNUSUAL BRUISING OR BLEEDING *URINARY PROBLEMS (pain or burning when urinating, or frequent urination) *BOWEL PROBLEMS (unusual diarrhea, constipation, pain near the anus) TENDERNESS IN MOUTH AND THROAT WITH OR WITHOUT PRESENCE OF ULCERS (sore throat, sores in mouth, or a toothache) UNUSUAL RASH, SWELLING OR PAIN  UNUSUAL VAGINAL DISCHARGE OR ITCHING   Items with * indicate a potential emergency and should be followed up as soon as possible or go to the Emergency Department if any problems should occur.  Please show the CHEMOTHERAPY ALERT CARD or IMMUNOTHERAPY ALERT CARD at check-in to  the Emergency Department and triage nurse.  Should you have questions after your visit or need to cancel or reschedule your appointment, please contact [facility] [place] AT [place]  [phone] and follow the prompts.  Office hours are 8:00 a.m. to 4:30 p.m. Monday - Friday. Please note that voicemails left after 4:00 p.m. may not be returned until the following business day.  We are closed weekends and major holidays. You have access to a nurse at all times for urgent questions. Please call the main number to the clinic [phone] and follow the prompts.  For any non-urgent questions, you may also contact your provider using MyChart. We now offer e-Visits for anyone 39 and older to request care online for non-urgent symptoms. For details visit [email or web address].   Also download the MyChart app! Go to the app store, search "MyChart", open the app, select , and log in with your MyChart username and password.  Masks are optional in the cancer centers. If you would like for your care team to wear a mask while they are taking care of you, please let them know. For doctor visits, patients may have with them one support person who is at least 74 years old. At this time, visitors are not allowed in the infusion area.

## 2021-08-22 ENCOUNTER — Other Ambulatory Visit: Payer: Self-pay

## 2021-08-26 ENCOUNTER — Encounter: Payer: Self-pay | Admitting: Oncology

## 2021-08-26 ENCOUNTER — Inpatient Hospital Stay: Payer: Medicare PPO

## 2021-08-26 ENCOUNTER — Inpatient Hospital Stay (HOSPITAL_BASED_OUTPATIENT_CLINIC_OR_DEPARTMENT_OTHER): Payer: Medicare PPO | Admitting: Oncology

## 2021-08-26 DIAGNOSIS — Z5112 Encounter for antineoplastic immunotherapy: Secondary | ICD-10-CM

## 2021-08-26 DIAGNOSIS — J432 Centrilobular emphysema: Secondary | ICD-10-CM | POA: Diagnosis not present

## 2021-08-26 DIAGNOSIS — Z79899 Other long term (current) drug therapy: Secondary | ICD-10-CM | POA: Diagnosis not present

## 2021-08-26 DIAGNOSIS — Z720 Tobacco use: Secondary | ICD-10-CM | POA: Diagnosis not present

## 2021-08-26 DIAGNOSIS — C539 Malignant neoplasm of cervix uteri, unspecified: Secondary | ICD-10-CM | POA: Diagnosis not present

## 2021-08-26 DIAGNOSIS — C3491 Malignant neoplasm of unspecified part of right bronchus or lung: Secondary | ICD-10-CM | POA: Diagnosis not present

## 2021-08-26 DIAGNOSIS — I700 Atherosclerosis of aorta: Secondary | ICD-10-CM | POA: Diagnosis not present

## 2021-08-26 LAB — CBC WITH DIFFERENTIAL/PLATELET
Abs Immature Granulocytes: 0.04 10*3/uL (ref 0.00–0.07)
Basophils Absolute: 0 10*3/uL (ref 0.0–0.1)
Basophils Relative: 1 %
Eosinophils Absolute: 0.2 10*3/uL (ref 0.0–0.5)
Eosinophils Relative: 3 %
HCT: 30 % — ABNORMAL LOW (ref 36.0–46.0)
Hemoglobin: 10.2 g/dL — ABNORMAL LOW (ref 12.0–15.0)
Immature Granulocytes: 1 %
Lymphocytes Relative: 8 %
Lymphs Abs: 0.6 10*3/uL — ABNORMAL LOW (ref 0.7–4.0)
MCH: 32.7 pg (ref 26.0–34.0)
MCHC: 34 g/dL (ref 30.0–36.0)
MCV: 96.2 fL (ref 80.0–100.0)
Monocytes Absolute: 0.5 10*3/uL (ref 0.1–1.0)
Monocytes Relative: 7 %
Neutro Abs: 5.4 10*3/uL (ref 1.7–7.7)
Neutrophils Relative %: 80 %
Platelets: 266 10*3/uL (ref 150–400)
RBC: 3.12 MIL/uL — ABNORMAL LOW (ref 3.87–5.11)
RDW: 12.8 % (ref 11.5–15.5)
WBC: 6.7 10*3/uL (ref 4.0–10.5)
nRBC: 0 % (ref 0.0–0.2)

## 2021-08-26 LAB — COMPREHENSIVE METABOLIC PANEL
ALT: 10 U/L (ref 0–44)
AST: 17 U/L (ref 15–41)
Albumin: 3.6 g/dL (ref 3.5–5.0)
Alkaline Phosphatase: 59 U/L (ref 38–126)
Anion gap: 7 (ref 5–15)
BUN: 20 mg/dL (ref 8–23)
CO2: 23 mmol/L (ref 22–32)
Calcium: 8.9 mg/dL (ref 8.9–10.3)
Chloride: 106 mmol/L (ref 98–111)
Creatinine, Ser: 1.39 mg/dL — ABNORMAL HIGH (ref 0.44–1.00)
GFR, Estimated: 40 mL/min — ABNORMAL LOW (ref >60)
Glucose, Bld: 97 mg/dL (ref 70–99)
Potassium: 4.5 mmol/L (ref 3.5–5.1)
Sodium: 136 mmol/L (ref 135–145)
Total Bilirubin: 0.9 mg/dL (ref 0.3–1.2)
Total Protein: 6.4 g/dL — ABNORMAL LOW (ref 6.5–8.1)

## 2021-08-26 LAB — T4, FREE: Free T4: 1.18 ng/dL — ABNORMAL HIGH (ref 0.61–1.12)

## 2021-08-26 LAB — TSH: TSH: 0.918 u[IU]/mL (ref 0.350–4.500)

## 2021-08-26 MED ORDER — SODIUM CHLORIDE 0.9 % IV SOLN
10.0000 mg/kg | Freq: Once | INTRAVENOUS | Status: AC
  Administered 2021-08-26: 620 mg via INTRAVENOUS
  Filled 2021-08-26: qty 10

## 2021-08-26 MED ORDER — SODIUM CHLORIDE 0.9% FLUSH
10.0000 mL | INTRAVENOUS | Status: DC | PRN
  Filled 2021-08-26: qty 10

## 2021-08-26 MED ORDER — HEPARIN SOD (PORK) LOCK FLUSH 100 UNIT/ML IV SOLN
500.0000 [IU] | Freq: Once | INTRAVENOUS | Status: AC | PRN
  Administered 2021-08-26: 500 [IU]
  Filled 2021-08-26: qty 5

## 2021-08-26 MED ORDER — SODIUM CHLORIDE 0.9 % IV SOLN
Freq: Once | INTRAVENOUS | Status: AC
  Filled 2021-08-26: qty 250

## 2021-08-26 NOTE — Patient Instructions (Signed)
[facility] AT [place]  Discharge Instructions: Thank you for choosing [facility] to provide your oncology and hematology care.  If you have a lab appointment with the [facility], please go directly to the [facility] and check in at the registration area.  Wear comfortable clothing and clothing appropriate for easy access to any Portacath or PICC line.   We strive to give you quality time with your provider. You may need to reschedule your appointment if you arrive late (15 or more minutes).  Arriving late affects you and other patients whose appointments are after yours.  Also, if you miss three or more appointments without notifying the office, you may be dismissed from the clinic at the provider's discretion.      For prescription refill requests, have your pharmacy contact our office and allow 72 hours for refills to be completed.    Today you received the following chemotherapy and/or immunotherapy agents durvalumab      To help prevent nausea and vomiting after your treatment, we encourage you to take your nausea medication as directed.  BELOW ARE SYMPTOMS THAT SHOULD BE REPORTED IMMEDIATELY: *FEVER GREATER THAN 100.4 F (38 C) OR HIGHER *CHILLS OR SWEATING *NAUSEA AND VOMITING THAT IS NOT CONTROLLED WITH YOUR NAUSEA MEDICATION *UNUSUAL SHORTNESS OF BREATH *UNUSUAL BRUISING OR BLEEDING *URINARY PROBLEMS (pain or burning when urinating, or frequent urination) *BOWEL PROBLEMS (unusual diarrhea, constipation, pain near the anus) TENDERNESS IN MOUTH AND THROAT WITH OR WITHOUT PRESENCE OF ULCERS (sore throat, sores in mouth, or a toothache) UNUSUAL RASH, SWELLING OR PAIN  UNUSUAL VAGINAL DISCHARGE OR ITCHING   Items with * indicate a potential emergency and should be followed up as soon as possible or go to the Emergency Department if any problems should occur.  Please show the CHEMOTHERAPY ALERT CARD or IMMUNOTHERAPY ALERT CARD at check-in to  the Emergency Department and triage nurse.  Should you have questions after your visit or need to cancel or reschedule your appointment, please contact [facility] [place] AT [place]  [phone] and follow the prompts.  Office hours are 8:00 a.m. to 4:30 p.m. Monday - Friday. Please note that voicemails left after 4:00 p.m. may not be returned until the following business day.  We are closed weekends and major holidays. You have access to a nurse at all times for urgent questions. Please call the main number to the clinic [phone] and follow the prompts.  For any non-urgent questions, you may also contact your provider using MyChart. We now offer e-Visits for anyone 32 and older to request care online for non-urgent symptoms. For details visit [email or web address].   Also download the MyChart app! Go to the app store, search "MyChart", open the app, select [place] [facility] [place], and log in with your MyChart username and password.  Masks are optional in the cancer centers. If you would like for your care team to wear a mask while they are taking care of you, please let them know. For doctor visits, patients may have with them one support person who is at least 74 years old. At this time, visitors are not allowed in the infusion area.  Durvalumab injection What is this medication? DURVALUMAB (dur VAL ue mab) is a monoclonal antibody. It is used to treat lung cancer. This medicine may be used for other purposes; ask your health care provider or pharmacist if you have questions. COMMON BRAND NAME(S): [email or web address] What should I tell my care team before I take this  medication? They need to know if you have any of these conditions: autoimmune diseases like Crohn's disease, ulcerative colitis, or lupus have had or planning to have an allogeneic stem cell transplant (uses someone else's stem cells) history of organ transplant history of radiation to the chest nervous system problems like  myasthenia gravis or Guillain-Barre syndrome an unusual or allergic reaction to durvalumab, other medicines, foods, dyes, or preservatives pregnant or trying to get pregnant breast-feeding How should I use this medication? This medicine is for infusion into a vein. It is given by a health care professional in a hospital or clinic setting. A special MedGuide will be given to you before each treatment. Be sure to read this information carefully each time. Talk to your pediatrician regarding the use of this medicine in children. Special care may be needed. Overdosage: If you think you have taken too much of this medicine contact a poison control center or emergency room at once. NOTE: This medicine is only for you. Do not share this medicine with others. What if I miss a dose? It is important not to miss your dose. Call your doctor or health care professional if you are unable to keep an appointment. What may interact with this medication? Interactions have not been studied. This list may not describe all possible interactions. Give your health care provider a list of all the medicines, herbs, non-prescription drugs, or dietary supplements you use. Also tell them if you smoke, drink alcohol, or use illegal drugs. Some items may interact with your medicine. What should I watch for while using this medication? This medication may make you feel generally unwell. Continue your course of treatment even though you feel ill unless your care team tells you to stop. You may need blood work done while you are taking this medication. Do not become pregnant while taking this medication or for 3 months after stopping it. Women should inform their care team if they wish to become pregnant or think they might be pregnant. There is a potential for serious side effects to an unborn child. Talk to your care team or pharmacist for more information. Do not breast-feed an infant while taking this medication or for 3  months after stopping it. What side effects may I notice from receiving this medication? Side effects that you should report to your care team as soon as possible: Allergic reactions--skin rash, itching, hives, swelling of the face, lips, tongue, or throat Bloody or watery diarrhea Dizziness, loss of balance or coordination, confusion or trouble speaking Dry cough, shortness of breath or trouble breathing Flushing, mostly over the face, neck, and chest, during injection High blood sugar (hyperglycemia)--increased thirst or amount of urine, unusual weakness or fatigue, blurry vision High thyroid levels (hyperthyroidism)--fast or irregular heartbeat, weight loss, excessive sweating or sensitivity to heat, tremors or shaking, anxiety, nervousness, irregular menstrual cycle or spotting Infection--fever, chills, cough, or sore throat Liver injury--right upper belly pain, loss of appetite, nausea, light-colored stool, dark yellow or brown urine, yellowing skin or eyes, unusual weakness or fatigue Low adrenal gland function--nausea, vomiting, loss of appetite, unusual weakness or fatigue, dizziness, low blood pressure Low thyroid levels (hypothyroidism)--unusual weakness or fatigue, increased sensitivity to cold, constipation, hair loss, dry skin, weight gain, feelings of depression Pancreatitis--severe stomach pain that spreads to your back or gets worse after eating or when touched, fever, nausea, vomiting Rash, fever, and swollen lymph nodes Redness, blistering, peeling or loosening of the skin, including inside the mouth Wheezing--trouble breathing with loud  or whistling sounds Side effects that usually do not require medical attention (report these to your care team if they continue or are bothersome): Fatigue Hair loss This list may not describe all possible side effects. Call your doctor for medical advice about side effects. You may report side effects to FDA at 1-800-FDA-1088. Where should I  keep my medication? This medication is given in a hospital or clinic. It will not be stored at home. NOTE: This sheet is a summary. It may not cover all possible information. If you have questions about this medicine, talk to your doctor, pharmacist, or health care provider.  2023 Elsevier/Gold Standard (2020-12-17 00:00:00)

## 2021-08-26 NOTE — Assessment & Plan Note (Signed)
Treatment plan was listed above

## 2021-08-26 NOTE — Assessment & Plan Note (Signed)
Stage III right lung adenocarcinoma,  Recurrent S/p concurrent chemotherapy and radiation- On immunotherapy durvalumab maintenance Labs reviewed and discussed with patient.  Proceed with Durvalumab treatments.

## 2021-08-26 NOTE — Assessment & Plan Note (Signed)
Recommend smoke cessation.  

## 2021-08-26 NOTE — Progress Notes (Signed)
Hematology/Oncology Progress note [phone] [phone]      Patient Care Team: [name], DO as PCP - General (Family Medicine) Clent Jacks, RN as Oncology Nurse Navigator Noreene Filbert, MD as Radiation Oncologist (Radiation Oncology) Telford Nab, RN as Oncology Nurse Navigator [name], MD as Consulting Physician (Oncology)  ASSESSMENT & PLAN:   Cancer Staging  Cervical cancer [name]) Staging form: Cervix Uteri, AJCC Version 9 - Clinical: FIGO Stage IIICr (cTX, cN1) - Unsigned  Primary lung adenocarcinoma ([place]) Staging form: Lung, AJCC 7th Edition - Clinical stage from 01/12/2021: T1, N1, M0 - Signed by [name], MD on 01/29/2021   Primary lung adenocarcinoma ([place]) Stage III right lung adenocarcinoma,  Recurrent S/p concurrent chemotherapy and radiation- On immunotherapy durvalumab maintenance Labs reviewed and discussed with patient.  Proceed with Durvalumab treatments.   Encounter for antineoplastic immunotherapy Treatment plan was listed above  No orders of the defined types were placed in this encounter.  Lab md 2 weeks durvalumab All questions were answered. The patient knows to call the clinic with any problems, questions or concerns.  [name], MD, PhD [facility] Hematology Oncology 08/26/2021     CHIEF COMPLAINTS/REASON FOR VISIT:  Follow up lung cancer treatments  HISTORY OF PRESENTING ILLNESS:  # Cervix cancer Patient has developed postmenopausal vaginal bleeding and discharge.  She was noted to have a friable cervix/2 cm mass of the cervix, concerning for malignancy 12/19/2018 endometrial biopsy showed scattered atypical squamous cells suspicious for malignancy.  Predominantly necrosis with associated inflammation. 01/01/2019 initial cervix biopsy showed at least high-grade squamous intraepithelial lesion.-HPV negative 01/22/2019, repeat vaginal wall biopsy and cervix 9:00 biopsy showed squamous cell carcinoma.     Staging images 12/31/2018 showed fluid distending the endometrial canal and or endometrial thickening to the level of cervix.  No evidence of pathological lymphadenopathy.  Haziness about the lower cervix.  7 mm irregular pulmonary nodule within the right upper lobe, suspicious for possible primary or metastatic malignancy.  Chronic pleural-parenchymal scarring/fibrosis at the lung apices.  Large amount of stool.  No bowel obstruction. 01/08/2019 PET scan showed marked hypermetabolism in the region of the cervix, compatible with the reported history of cervical cancer. Small bilateral pelvic sidewall lymph nodes discernible FDG accumulation, concerning for metastatic disease although neither lymph node is enlarged by CT size criteria. 7 mm nodule in the right upper lobe shows discernible FDG accumulation.  Questionable neoplasm, primary versus metastatic. Emphysema.   #Chronic hearing loss  # Lung nodule, FDG avid, questionable primary bronchogenic carcinoma versus metastatic cancer.Discussed with radiation oncology.  Her case was also discussed on tumor board.  Consensus reached on finishing concurrent chemoradiation treatments for cervix cancer first. Possible SBRT to lung nodule for presumed primary lung cancer.  # 02/20/2020 - 03/19/2020 concurrent chemotherapy weekly carboplatin and taxol and radiation for treatment of this cervix cancer.   #December 2021 lung nodule,was evaluated by Dr.Oaks. Options of biopsy via transthoracic or endobronchial approach vs surgical resection.  Patient opted to empiric SBRT  Patient was last seen by me on 06/25/2019.  Lost follow-up. She continues to follow with radiation oncology. 11/20/2020, CT chest with contrast showed minimal residual of previously seen right upper lobe nodule.  0.6 x 0.4 cm.  Internal development of mild bandlike radiation fibrosis.  Newly enlarged pretracheal lymph node, measuring 2.5 x 1.7 cm.  Highly concerning for metastatic disease.   Unchanged prominent AP window and the right hilar lymph nodes.  Emphysema and diffuse bilateral bronchial wall  thickening.  Coronary artery disease. 12/13/2020, PET scan showed enlarging hypermetabolic right lower paratracheal lymph node, compatible with metastatic disease.  SUV 10.3.  Mild FDG uptake of the bilateral sacral alla with associated lucency.  Concerning for sacral insufficiency fracture.  Stable posttreatment changes of the right upper lobe nodule.  Nonspecific small solid 3 mm pulmonary nodule of the left lower lobe.  2 small to be characterized.  No evidence of FDG avid metastatic disease in the abdomen or pelvis.  Aortic atherosclerosis and emphysema.  Patient reestablish care on 12/27/2020 for lung cancer treatments   #01/12/2021 patient underwent bronchoscopy biopsy by Dr. Patsey Berthold Right paratracheal lymph node fine-needle aspiration is positive for metastatic non-small cell carcinoma, favor adenocarcinoma of lung origin.  Circulogen NGS negative PD-L1, ALK, ROS1, NTRK1/2/3,   # 02/14/2021-03/31/2021, concurrent carboplatin AUC 2 and taxol 75m/m2 with radiation.  04/26/2021, CT chest with contrast showed evolving postradiation changes in the posterior perifissural upper right lung without discrete residual measurable nodule in this location.  Right hilar/mediastinal lymphadenopathy stable to decreased.  Tiny 0.2 cm left lower lobe pulmonary nodule slightly decreased.  Positive response to treatment.  No new or progressive disease.  Three-vessel coronary arthrosclerosis.  Aortic atherosclerosis/emphysema.  INTERVAL HISTORY Mary SCHMALTZis a 76y.o. female who has above history reviewed by me today presents for follow up visit for management of possible recurrent lung cancer.  History of cervix cancer.   She feels well.  No new complaints.  Denies any SOB, cough, chest pain, diarrhea, rash.  Review of Systems  Constitutional:  Positive for fatigue. Negative for appetite change,  chills, fever and unexpected weight change.  HENT:   Positive for hearing loss. Negative for voice change.   Eyes:  Negative for eye problems.  Respiratory:  Negative for chest tightness and cough.   Cardiovascular:  Negative for chest pain.  Gastrointestinal:  Negative for abdominal distention, abdominal pain and blood in stool.  Endocrine: Negative for hot flashes.  Genitourinary:  Negative for difficulty urinating and frequency.   Musculoskeletal:  Negative for arthralgias.  Skin:  Negative for itching and rash.  Neurological:  Negative for extremity weakness.  Hematological:  Negative for adenopathy.  Psychiatric/Behavioral:  Negative for confusion.     MEDICAL HISTORY:  Past Medical History:  Diagnosis Date   Arthritis    Cancer ([place]    Carotid atherosclerosis    Carotid bruit    Cervical cancer (HCC)    COPD (chronic obstructive pulmonary disease) (HCC)    emphysema   Coronary artery disease    Family history of adverse reaction to anesthesia    paternal grandfather died during surgery-pt unaware what happened   GERD (gastroesophageal reflux disease)    Hyperlipidemia    Hypertension    Hyponatremia 02/27/2019   Hypothyroidism    Infected cat bite 1980s   was hospitalized   Irregular heartbeat    Medical history non-contributory    Peripheral vascular disease ([place]    Tobacco use     SURGICAL HISTORY: Past Surgical History:  Procedure Laterality Date   CAROTID ENDARTERECTOMY Right Oct. 2015   Dr. DLucky Cowboy  CAROTID STENOSIS Left April 2016   carotid stenosis surgery   COLONOSCOPY WITH PROPOFOL N/A 11/08/2016   Procedure: COLONOSCOPY WITH PROPOFOL;  Surgeon: AJonathon Bellows MD;  [facility];  Service: Gastroenterology;  Laterality: N/A;   ESOPHAGOGASTRODUODENOSCOPY (EGD) WITH PROPOFOL N/A 11/08/2016   Procedure: ESOPHAGOGASTRODUODENOSCOPY (EGD) WITH PROPOFOL;  Surgeon: AJonathon Bellows MD;  [facility];  Service: Gastroenterology;  Laterality:  N/A;   FLEXIBLE BRONCHOSCOPY N/A 12/17/2014   Procedure: FLEXIBLE BRONCHOSCOPY;  Surgeon: Wilhelmina Mcardle, MD;  Location: [facility] ORS;  Service: Pulmonary;  Laterality: N/A;   INCISION AND DRAINAGE / EXCISION THYROGLOSSAL CYST  April 2011   PORTA CATH INSERTION N/A 03/03/2021   Procedure: PORTA CATH INSERTION;  Surgeon: Algernon Huxley, MD;  Location: [facility];  Service: Cardiovascular;  Laterality: N/A;   VIDEO BRONCHOSCOPY WITH ENDOBRONCHIAL ULTRASOUND N/A 01/12/2021   Procedure: VIDEO BRONCHOSCOPY WITH ENDOBRONCHIAL ULTRASOUND;  Surgeon: Tyler Pita, MD;  Location: [facility] ORS;  Service: Cardiopulmonary;  Laterality: N/A;    SOCIAL HISTORY: Social History   Socioeconomic History   Marital status: Married    Spouse name: Deidre Ala   Number of children: Not on file   Years of education: Not on file   Highest education level: 9th grade  Occupational History   Occupation: retired   Tobacco Use   Smoking status: Every Day    Packs/day: 0.50    Years: 50.00    Total pack years: 25.00    Types: Cigarettes   Smokeless tobacco: Never   Tobacco comments:    0.5 PPD 02/10/2021  Vaping Use   Vaping Use: Former  Substance and Sexual Activity   Alcohol use: No    Alcohol/week: 0.0 standard drinks of alcohol   Drug use: No   Sexual activity: Yes  Other Topics Concern   Not on file  Social History Narrative   Lives at home with husband   Social Determinants of Health   Financial Resource Strain: Low Risk  (03/15/2021)   Overall Financial Resource Strain (CARDIA)    Difficulty of Paying Living Expenses: Not hard at all  Food Insecurity: No Food Insecurity (03/15/2021)   Hunger Vital Sign    Worried About Running Out of Food in the Last Year: Never true    [place] in the Last Year: Never true  Transportation Needs: No Transportation Needs (03/15/2021)   PRAPARE - Hydrologist (Medical): No    Lack of Transportation (Non-Medical): No   Physical Activity: Inactive (03/15/2021)   Exercise Vital Sign    Days of Exercise per Week: 0 days    Minutes of Exercise per Session: 0 min  Stress: No Stress Concern Present (03/15/2021)   [place]    Feeling of Stress : Not at all  Social Connections: Moderately Isolated (03/15/2021)   Social Connection and Isolation Panel [NHANES]    Frequency of Communication with Friends and Family: More than three times a week    Frequency of Social Gatherings with Friends and Family: More than three times a week    Attends Religious Services: Never    Marine scientist or Organizations: No    Attends Archivist Meetings: Never    Marital Status: Married  Human resources officer Violence: Not At Risk (03/15/2021)   Humiliation, Afraid, Rape, and Kick questionnaire    Fear of Current or Ex-Partner: No    Emotionally Abused: No    Physically Abused: No    Sexually Abused: No    FAMILY HISTORY: Family History  Problem Relation Age of Onset   Congestive Heart Failure Mother    Heart disease Mother    Hypertension Mother    Hypertension Sister    Diabetes Sister    Heart disease Sister    Heart  disease Maternal Uncle    Heart disease Maternal Grandmother    Stroke Maternal Grandfather    Cancer Brother        liver, lung   Heart disease Brother    Hypertension Brother    Heart attack Brother    COPD Neg Hx    Breast cancer Neg Hx     ALLERGIES:  is allergic to atorvastatin.  MEDICATIONS:  Current Outpatient Medications  Medication Sig Dispense Refill   acetaminophen (TYLENOL) 500 MG tablet Take 500 mg by mouth every 6 (six) hours as needed for moderate pain.     alendronate (FOSAMAX) 70 MG tablet Take 1 tablet (70 mg total) by mouth every 7 (seven) days. Take with a full glass of water on an empty stomach. 12 tablet 3   chlorhexidine (PERIDEX) 0.12 % solution Use as directed 5 mLs in the mouth or throat 2  (two) times daily. 120 mL 0   clopidogrel (PLAVIX) 75 MG tablet Take 1 tablet (75 mg total) by mouth daily. 90 tablet 3   cyclobenzaprine (FLEXERIL) 10 MG tablet TAKE 1 TABLET AT BEDTIME 60 tablet 3   ferrous sulfate 325 (65 FE) MG EC tablet Take 1 tablet (325 mg total) by mouth daily with breakfast. 90 tablet 4   Fluticasone-Umeclidin-Vilant (TRELEGY ELLIPTA) 100-62.5-25 MCG/INH AEPB Inhale 1 puff into the lungs daily. 1 each 11   gabapentin (NEURONTIN) 100 MG capsule TAKE 1 CAPSULE THREE TIMES DAILY 270 capsule 0   levothyroxine (SYNTHROID) 88 MCG tablet Take 1 tablet (88 mcg total) by mouth daily. 90 tablet 3   lidocaine (LIDODERM) 5 % Place 1 patch onto the skin daily. Remove & Discard patch within 12 hours or as directed by MD 90 patch 1   lidocaine-prilocaine (EMLA) cream Apply 1 application topically as needed. 60 g 3   lisinopril (ZESTRIL) 20 MG tablet TAKE 1 TABLET EVERY DAY 90 tablet 0   Multiple Vitamin (MULTIVITAMIN) tablet Take 1 tablet by mouth daily.     naproxen (NAPROSYN) 500 MG tablet TAKE 1 TABLET TWICE DAILY WITH MEALS 180 tablet 0   omeprazole (PRILOSEC) 20 MG capsule TAKE 1 CAPSULE EVERY DAY 90 capsule 2   ondansetron (ZOFRAN) 8 MG tablet Take 1 tablet (8 mg total) by mouth 2 (two) times daily as needed for refractory nausea / vomiting. Start on day 3 after chemo. 30 tablet 1   vitamin B-12 (CYANOCOBALAMIN) 500 MCG tablet Take 500 mcg by mouth daily.     No current facility-administered medications for this visit.     PHYSICAL EXAMINATION: ECOG PERFORMANCE STATUS: 1 - Symptomatic but completely ambulatory Vitals:   08/26/21 0924  BP: (!) 131/57  Pulse: 86  Temp: (!) 96.4 F (35.8 C)  SpO2: 100%   Filed Weights   08/26/21 0924  Weight: 138 lb 8 oz (62.8 kg)    Physical Exam Constitutional:      General: She is not in acute distress. HENT:     Head: Normocephalic and atraumatic.  Eyes:     General: No scleral icterus.    Pupils: Pupils are equal, round,  and reactive to light.  Cardiovascular:     Rate and Rhythm: Normal rate and regular rhythm.     Heart sounds: Normal heart sounds.  Pulmonary:     Effort: Pulmonary effort is normal. No respiratory distress.     Breath sounds: No wheezing.     Comments: Decreased breath sound bilaterally  Abdominal:     General: Bowel  sounds are normal. There is no distension.     Palpations: Abdomen is soft. There is no mass.     Tenderness: There is no abdominal tenderness.  Musculoskeletal:        General: No deformity. Normal range of motion.     Cervical back: Normal range of motion and neck supple.     Comments: Trace edema bilaterally  Skin:    General: Skin is warm and dry.     Findings: No erythema or rash.  Neurological:     Mental Status: She is alert and oriented to person, place, and time. Mental status is at baseline.     Cranial Nerves: No cranial nerve deficit.     Coordination: Coordination normal.  Psychiatric:        Mood and Affect: Mood normal.     LABORATORY DATA:  I have reviewed the data as listed Lab Results  Component Value Date   WBC 6.7 08/26/2021   HGB 10.2 (L) 08/26/2021   HCT 30.0 (L) 08/26/2021   MCV 96.2 08/26/2021   PLT 266 08/26/2021   Recent Labs    07/29/21 0826 08/12/21 0852 08/26/21 0907  NA 132* 127* 136  K 4.6 4.7 4.5  CL 104 98 106  CO2 20* 22 23  GLUCOSE 128* 98 97  BUN 28* 19 20  CREATININE 1.13* 0.97 1.39*  CALCIUM 8.6* 8.4* 8.9  GFRNONAA 51* >60 40*  PROT 6.2* 6.4* 6.4*  ALBUMIN 3.5 3.4* 3.6  AST _0 ALT _1 ALKPHOS 58 63 59  BILITOT 0.7 0.6 0.9    Iron/TIBC/Ferritin/ %Sat    Component Value Date/Time   IRON 90 05/04/2021 0929   TIBC 321 05/04/2021 0929   FERRITIN 52 05/04/2021 0929   IRONPCTSAT 28 05/04/2021 0929      RADIOGRAPHIC STUDIES: I have personally reviewed the radiological images as listed and agreed with the findings in the report. CT Chest W Contrast  Result Date: 07/25/2021 CLINICAL DATA:   Restaging lung cancer. No current complaints. * Tracking Code: BO * EXAM: CT CHEST WITH CONTRAST TECHNIQUE: Multidetector CT imaging of the chest was performed during intravenous contrast administration. RADIATION DOSE REDUCTION: This exam was performed according to the departmental dose-optimization program which includes automated exposure control, adjustment of the mA and/or kV according to patient size and/or use of iterative reconstruction technique. CONTRAST:  9m OMNIPAQUE IOHEXOL 300 MG/ML  SOLN COMPARISON:  Chest CT 04/25/2021 and 11/18/2020.  PET-CT 12/13/2020. FINDINGS: Cardiovascular: Left IJ Port-A-Cath extends to the upper right atrium. There is atherosclerosis of the aorta, great vessels and coronary arteries. No acute vascular findings. The heart size is normal. There is no pericardial effusion. Mediastinum/Nodes: Stable 9 mm right hilar node on image 58/2. No recurrent mediastinal, hilar or axillary adenopathy. The thyroid gland, trachea and esophagus demonstrate no significant findings. Lungs/Pleura: No pleural effusion or pneumothorax. Severe centrilobular paraseptal emphysema again noted with chronic radiation changes posteriorly in the right upper lobe and superior segment of the right lower lobe. 3 mm left lower lobe pulmonary nodule on image 96/3 appears slightly more prominent, although grossly unchanged from 11/18/2020. This measures up to 5 mm on sagittal image 131/6. Stable calcified right lower lobe granuloma. No other enlarging or new nodules identified. Upper abdomen: The visualized upper abdomen appears stable without significant findings. Musculoskeletal/Chest wall: There is no chest wall mass or suspicious osseous finding. Mild spondylosis. IMPRESSION: 1. Questionable slight enlargement a small left lower lobe nodule measuring  up to 5 mm of the sagittal images compared with the most recent prior study. Attention on follow-up in approximately 6 months recommended. 2. Otherwise  stable chest CT with stable radiation changes superiorly in the right hemithorax and small residual right hilar lymph node. 3. Coronary and aortic atherosclerosis (ICD10-I70.0). Emphysema (ICD10-J43.9). Electronically Signed   By: Richardean Sale M.D.   On: 07/25/2021 16:00

## 2021-08-26 NOTE — Progress Notes (Signed)
Nutrition Follow-up:  Patient with lung cancer followed by Dr Tasia Catchings.  Patient receiving durvalumab.  Met with patient during infusion.  Patient says that she has a good appetite.  Grand-daughter helps/brings her meals and she and husband go out to eat.   "I can't cook anymore."  "I mess everything up."  Ate [employer] chili and baked potato last night for supper.  Did not eat breakfast this am because did not have time.      Medications: reviewed  Labs: reviewed  Anthropometrics:   Weight 138 lb 8 oz today  136 lb on 5/19 141 lb on 4/5  UBW of 136-141 lb   NUTRITION DIAGNOSIS: Unintentional weight loss improved   INTERVENTION:  Encouraged patient to continue eating to maintain weight during treatment    MONITORING, EVALUATION, GOAL: weight trends, intake   NEXT VISIT: as needed  Mary Hoover, [place], [place] Registered Dietitian [phone]

## 2021-09-09 ENCOUNTER — Encounter: Payer: Self-pay | Admitting: Oncology

## 2021-09-09 ENCOUNTER — Inpatient Hospital Stay: Payer: Medicare PPO | Attending: Oncology

## 2021-09-09 ENCOUNTER — Inpatient Hospital Stay: Payer: Medicare PPO

## 2021-09-09 ENCOUNTER — Inpatient Hospital Stay (HOSPITAL_BASED_OUTPATIENT_CLINIC_OR_DEPARTMENT_OTHER): Payer: Medicare PPO | Admitting: Oncology

## 2021-09-09 VITALS — BP 142/62 | HR 84 | Temp 97.6°F | Resp 18 | Wt 138.9 lb

## 2021-09-09 DIAGNOSIS — Z720 Tobacco use: Secondary | ICD-10-CM

## 2021-09-09 DIAGNOSIS — Z79899 Other long term (current) drug therapy: Secondary | ICD-10-CM | POA: Insufficient documentation

## 2021-09-09 DIAGNOSIS — C3491 Malignant neoplasm of unspecified part of right bronchus or lung: Secondary | ICD-10-CM

## 2021-09-09 DIAGNOSIS — N183 Chronic kidney disease, stage 3 unspecified: Secondary | ICD-10-CM | POA: Diagnosis not present

## 2021-09-09 DIAGNOSIS — I129 Hypertensive chronic kidney disease with stage 1 through stage 4 chronic kidney disease, or unspecified chronic kidney disease: Secondary | ICD-10-CM | POA: Insufficient documentation

## 2021-09-09 DIAGNOSIS — C539 Malignant neoplasm of cervix uteri, unspecified: Secondary | ICD-10-CM | POA: Diagnosis not present

## 2021-09-09 DIAGNOSIS — Z5112 Encounter for antineoplastic immunotherapy: Secondary | ICD-10-CM

## 2021-09-09 LAB — COMPREHENSIVE METABOLIC PANEL
ALT: 14 U/L (ref 0–44)
AST: 22 U/L (ref 15–41)
Albumin: 3.3 g/dL — ABNORMAL LOW (ref 3.5–5.0)
Alkaline Phosphatase: 56 U/L (ref 38–126)
Anion gap: 11 (ref 5–15)
BUN: 19 mg/dL (ref 8–23)
CO2: 24 mmol/L (ref 22–32)
Calcium: 9.1 mg/dL (ref 8.9–10.3)
Chloride: 95 mmol/L — ABNORMAL LOW (ref 98–111)
Creatinine, Ser: 1.12 mg/dL — ABNORMAL HIGH (ref 0.44–1.00)
GFR, Estimated: 52 mL/min — ABNORMAL LOW (ref >60)
Glucose, Bld: 108 mg/dL — ABNORMAL HIGH (ref 70–99)
Potassium: 4.6 mmol/L (ref 3.5–5.1)
Sodium: 130 mmol/L — ABNORMAL LOW (ref 135–145)
Total Bilirubin: 0.3 mg/dL (ref 0.3–1.2)
Total Protein: 6.1 g/dL — ABNORMAL LOW (ref 6.5–8.1)

## 2021-09-09 LAB — CBC WITH DIFFERENTIAL/PLATELET
Abs Immature Granulocytes: 0.05 10*3/uL (ref 0.00–0.07)
Basophils Absolute: 0 10*3/uL (ref 0.0–0.1)
Basophils Relative: 1 %
Eosinophils Absolute: 0.2 10*3/uL (ref 0.0–0.5)
Eosinophils Relative: 3 %
HCT: 29.9 % — ABNORMAL LOW (ref 36.0–46.0)
Hemoglobin: 10.4 g/dL — ABNORMAL LOW (ref 12.0–15.0)
Immature Granulocytes: 1 %
Lymphocytes Relative: 8 %
Lymphs Abs: 0.6 10*3/uL — ABNORMAL LOW (ref 0.7–4.0)
MCH: 32.7 pg (ref 26.0–34.0)
MCHC: 34.8 g/dL (ref 30.0–36.0)
MCV: 94 fL (ref 80.0–100.0)
Monocytes Absolute: 0.5 10*3/uL (ref 0.1–1.0)
Monocytes Relative: 7 %
Neutro Abs: 6.1 10*3/uL (ref 1.7–7.7)
Neutrophils Relative %: 80 %
Platelets: 274 10*3/uL (ref 150–400)
RBC: 3.18 MIL/uL — ABNORMAL LOW (ref 3.87–5.11)
RDW: 13.1 % (ref 11.5–15.5)
WBC: 7.5 10*3/uL (ref 4.0–10.5)
nRBC: 0 % (ref 0.0–0.2)

## 2021-09-09 LAB — T4, FREE: Free T4: 0.87 ng/dL (ref 0.61–1.12)

## 2021-09-09 LAB — TSH: TSH: 2.365 u[IU]/mL (ref 0.350–4.500)

## 2021-09-09 MED ORDER — SODIUM CHLORIDE 0.9 % IV SOLN
Freq: Once | INTRAVENOUS | Status: AC
  Filled 2021-09-09: qty 250

## 2021-09-09 MED ORDER — HEPARIN SOD (PORK) LOCK FLUSH 100 UNIT/ML IV SOLN
500.0000 [IU] | Freq: Once | INTRAVENOUS | Status: AC | PRN
  Administered 2021-09-09: 500 [IU]
  Filled 2021-09-09: qty 5

## 2021-09-09 MED ORDER — SODIUM CHLORIDE 0.9 % IV SOLN
10.0000 mg/kg | Freq: Once | INTRAVENOUS | Status: AC
  Administered 2021-09-09: 620 mg via INTRAVENOUS
  Filled 2021-09-09: qty 10

## 2021-09-09 NOTE — Patient Instructions (Signed)
[facility] AT [place]  Discharge Instructions: Thank you for choosing [facility] to provide your oncology and hematology care.  If you have a lab appointment with the [facility], please go directly to the [facility] and check in at the registration area.  Wear comfortable clothing and clothing appropriate for easy access to any Portacath or PICC line.   We strive to give you quality time with your provider. You may need to reschedule your appointment if you arrive late (15 or more minutes).  Arriving late affects you and other patients whose appointments are after yours.  Also, if you miss three or more appointments without notifying the office, you may be dismissed from the clinic at the provider's discretion.      For prescription refill requests, have your pharmacy contact our office and allow 72 hours for refills to be completed.    Today you received the following chemotherapy and/or immunotherapy agents Imfiniz    To help prevent nausea and vomiting after your treatment, we encourage you to take your nausea medication as directed.  BELOW ARE SYMPTOMS THAT SHOULD BE REPORTED IMMEDIATELY: *FEVER GREATER THAN 100.4 F (38 C) OR HIGHER *CHILLS OR SWEATING *NAUSEA AND VOMITING THAT IS NOT CONTROLLED WITH YOUR NAUSEA MEDICATION *UNUSUAL SHORTNESS OF BREATH *UNUSUAL BRUISING OR BLEEDING *URINARY PROBLEMS (pain or burning when urinating, or frequent urination) *BOWEL PROBLEMS (unusual diarrhea, constipation, pain near the anus) TENDERNESS IN MOUTH AND THROAT WITH OR WITHOUT PRESENCE OF ULCERS (sore throat, sores in mouth, or a toothache) UNUSUAL RASH, SWELLING OR PAIN  UNUSUAL VAGINAL DISCHARGE OR ITCHING   Items with * indicate a potential emergency and should be followed up as soon as possible or go to the Emergency Department if any problems should occur.  Please show the CHEMOTHERAPY ALERT CARD or IMMUNOTHERAPY ALERT CARD at check-in to the  Emergency Department and triage nurse.  Should you have questions after your visit or need to cancel or reschedule your appointment, please contact [facility] CANCER [place] AT [place]  [phone] and follow the prompts.  Office hours are 8:00 a.m. to 4:30 p.m. Monday - Friday. Please note that voicemails left after 4:00 p.m. may not be returned until the following business day.  We are closed weekends and major holidays. You have access to a nurse at all times for urgent questions. Please call the main number to the clinic [phone] and follow the prompts.  For any non-urgent questions, you may also contact your provider using MyChart. We now offer e-Visits for anyone 75 and older to request care online for non-urgent symptoms. For details visit [email or web address].   Also download the MyChart app! Go to the app store, search "MyChart", open the app, select [facility], and log in with your MyChart username and password.  Masks are optional in the cancer centers. If you would like for your care team to wear a mask while they are taking care of you, please let them know. For doctor visits, patients may have with them one support person who is at least 74 years old. At this time, visitors are not allowed in the infusion area.

## 2021-09-10 ENCOUNTER — Encounter: Payer: Self-pay | Admitting: Oncology

## 2021-09-10 NOTE — Progress Notes (Signed)
Hematology/Oncology Progress note [phone] [phone]      Patient Care Team: [name], DO as PCP - General (Family Medicine) Clent Jacks, RN as Oncology Nurse Navigator Noreene Filbert, MD as Radiation Oncologist (Radiation Oncology) Telford Nab, RN as Oncology Nurse Navigator [name], MD as Consulting Physician (Oncology)  ASSESSMENT & PLAN:   Cancer Staging  Cervical cancer [facility]) Staging form: Cervix Uteri, AJCC Version 9 - Clinical: FIGO Stage IIICr (cTX, cN1) - Unsigned  Primary lung adenocarcinoma ([place]) Staging form: Lung, AJCC 7th Edition - Clinical stage from 01/12/2021: T1, N1, M0 - Signed by [name], MD on 01/29/2021   Primary lung adenocarcinoma ([place]) Stage III right lung adenocarcinoma,  Recurrent S/p concurrent chemotherapy and radiation- On immunotherapy durvalumab maintenance Labs reviewed and discussed with patient.  Proceed with Durvalumab treatments.   Tobacco use Recommend smoke cessation.  Encounter for antineoplastic immunotherapy Treatment plan was listed above  Orders Placed This Encounter  Procedures   CT CHEST ABDOMEN PELVIS W CONTRAST    Standing Status:   Future    Standing Expiration Date:   09/10/2022    Order Specific Question:   Preferred imaging location?    Answer:   [facility]    Order Specific Question:   Is Oral Contrast requested for this exam?    Answer:   Yes, Per Radiology protocol    Lab md 2 weeks durvalumab All questions were answered. The patient knows to call the clinic with any problems, questions or concerns.  [name], MD, PhD [facility] Hematology Oncology 09/09/2021     CHIEF COMPLAINTS/REASON FOR VISIT:  Follow up lung cancer treatments  HISTORY OF PRESENTING ILLNESS:  # Cervix cancer Patient has developed postmenopausal vaginal bleeding and discharge.  She was noted to have a friable cervix/2 cm mass of the cervix, concerning for malignancy 12/19/2018  endometrial biopsy showed scattered atypical squamous cells suspicious for malignancy.  Predominantly necrosis with associated inflammation. 01/01/2019 initial cervix biopsy showed at least high-grade squamous intraepithelial lesion.-HPV negative 01/22/2019, repeat vaginal wall biopsy and cervix 9:00 biopsy showed squamous cell carcinoma.    Staging images 12/31/2018 showed fluid distending the endometrial canal and or endometrial thickening to the level of cervix.  No evidence of pathological lymphadenopathy.  Haziness about the lower cervix.  7 mm irregular pulmonary nodule within the right upper lobe, suspicious for possible primary or metastatic malignancy.  Chronic pleural-parenchymal scarring/fibrosis at the lung apices.  Large amount of stool.  No bowel obstruction. 01/08/2019 PET scan showed marked hypermetabolism in the region of the cervix, compatible with the reported history of cervical cancer. Small bilateral pelvic sidewall lymph nodes discernible FDG accumulation, concerning for metastatic disease although neither lymph node is enlarged by CT size criteria. 7 mm nodule in the right upper lobe shows discernible FDG accumulation.  Questionable neoplasm, primary versus metastatic. Emphysema.   #Chronic hearing loss  # Lung nodule, FDG avid, questionable primary bronchogenic carcinoma versus metastatic cancer.Discussed with radiation oncology.  Her case was also discussed on tumor board.  Consensus reached on finishing concurrent chemoradiation treatments for cervix cancer first. Possible SBRT to lung nodule for presumed primary lung cancer.  # 02/20/2020 - 03/19/2020 concurrent chemotherapy weekly carboplatin and taxol and radiation for treatment of this cervix cancer.   #December 2021 lung nodule,was evaluated by Dr.Oaks. Options of biopsy via transthoracic or endobronchial approach vs surgical resection.  Patient opted to empiric SBRT  Patient was last seen by me on 06/25/2019.  Lost  follow-up. She continues to follow with radiation oncology. 11/20/2020, CT chest with contrast showed minimal residual of previously seen right upper lobe nodule.  0.6 x 0.4 cm.  Internal development of mild bandlike radiation fibrosis.  Newly enlarged pretracheal lymph node, measuring 2.5 x 1.7 cm.  Highly concerning for metastatic disease.  Unchanged prominent AP window and the right hilar lymph nodes.  Emphysema and diffuse bilateral bronchial wall thickening.  Coronary artery disease. 12/13/2020, PET scan showed enlarging hypermetabolic right lower paratracheal lymph node, compatible with metastatic disease.  SUV 10.3.  Mild FDG uptake of the bilateral sacral alla with associated lucency.  Concerning for sacral insufficiency fracture.  Stable posttreatment changes of the right upper lobe nodule.  Nonspecific small solid 3 mm pulmonary nodule of the left lower lobe.  2 small to be characterized.  No evidence of FDG avid metastatic disease in the abdomen or pelvis.  Aortic atherosclerosis and emphysema.  Patient reestablish care on 12/27/2020 for lung cancer treatments   #01/12/2021 patient underwent bronchoscopy biopsy by Dr. Patsey Berthold Right paratracheal lymph node fine-needle aspiration is positive for metastatic non-small cell carcinoma, favor adenocarcinoma of lung origin.  Circulogen NGS negative PD-L1, ALK, ROS1, NTRK1/2/3,   # 02/14/2021-03/31/2021, concurrent carboplatin AUC 2 and taxol 45m/m2 with radiation.  04/26/2021, CT chest with contrast showed evolving postradiation changes in the posterior perifissural upper right lung without discrete residual measurable nodule in this location.  Right hilar/mediastinal lymphadenopathy stable to decreased.  Tiny 0.2 cm left lower lobe pulmonary nodule slightly decreased.  Positive response to treatment.  No new or progressive disease.  Three-vessel coronary arthrosclerosis.  Aortic atherosclerosis/emphysema.  INTERVAL HISTORY Mary ZABRISKIEis a  76y.o. female who has above history reviewed by me today presents for follow up visit for management of possible recurrent lung cancer.  History of cervix cancer.    No new complaints. Occasionally she has mild diarrhea, resolves after taking imodium.  Denies any SOB, cough, chest pain, diarrhea, rash.  Review of Systems  Constitutional:  Positive for fatigue. Negative for appetite change, chills, fever and unexpected weight change.  HENT:   Positive for hearing loss. Negative for voice change.   Eyes:  Negative for eye problems.  Respiratory:  Negative for chest tightness and cough.   Cardiovascular:  Negative for chest pain.  Gastrointestinal:  Negative for abdominal distention, abdominal pain and blood in stool.  Endocrine: Negative for hot flashes.  Genitourinary:  Negative for difficulty urinating and frequency.   Musculoskeletal:  Negative for arthralgias.  Skin:  Negative for itching and rash.  Neurological:  Negative for extremity weakness.  Hematological:  Negative for adenopathy.  Psychiatric/Behavioral:  Negative for confusion.     MEDICAL HISTORY:  Past Medical History:  Diagnosis Date   Arthritis    Cancer ([facility]    Carotid atherosclerosis    Carotid bruit    Cervical cancer (HCC)    COPD (chronic obstructive pulmonary disease) (HCC)    emphysema   Coronary artery disease    Family history of adverse reaction to anesthesia    paternal grandfather died during surgery-pt unaware what happened   GERD (gastroesophageal reflux disease)    Hyperlipidemia    Hypertension    Hyponatremia 02/27/2019   Hypothyroidism    Infected cat bite 1980s   was hospitalized   Irregular heartbeat    Medical history non-contributory    Peripheral vascular disease ([place]    Tobacco use     SURGICAL HISTORY: Past Surgical History:  Procedure Laterality Date   CAROTID ENDARTERECTOMY Right Oct. 2015   Dr. Lucky Cowboy   CAROTID STENOSIS Left April 2016   carotid stenosis surgery    COLONOSCOPY WITH PROPOFOL N/A 11/08/2016   Procedure: COLONOSCOPY WITH PROPOFOL;  Surgeon: Jonathon Bellows, MD;  Location: [facility] ENDOSCOPY;  Service: Gastroenterology;  Laterality: N/A;   ESOPHAGOGASTRODUODENOSCOPY (EGD) WITH PROPOFOL N/A 11/08/2016   Procedure: ESOPHAGOGASTRODUODENOSCOPY (EGD) WITH PROPOFOL;  Surgeon: Jonathon Bellows, MD;  Location[facility] ENDOSCOPY;  Service: Gastroenterology;  Laterality: N/A;   FLEXIBLE BRONCHOSCOPY N/A 12/17/2014   Procedure: FLEXIBLE BRONCHOSCOPY;  Surgeon: Wilhelmina Mcardle, MD;  Location: [facility] ORS;  Service: Pulmonary;  Laterality: N/A;   INCISION AND DRAINAGE / EXCISION THYROGLOSSAL CYST  April 2011   PORTA CATH INSERTION N/A 03/03/2021   Procedure: PORTA CATH INSERTION;  Surgeon: Algernon Huxley, MD;  Location: [facility] CV LAB;  Service: Cardiovascular;  Laterality: N/A;   VIDEO BRONCHOSCOPY WITH ENDOBRONCHIAL ULTRASOUND N/A 01/12/2021   Procedure: VIDEO BRONCHOSCOPY WITH ENDOBRONCHIAL ULTRASOUND;  Surgeon: Tyler Pita, MD;  Location: [facility] ORS;  Service: Cardiopulmonary;  Laterality: N/A;    SOCIAL HISTORY: Social History   Socioeconomic History   Marital status: Married    Spouse name: Deidre Ala   Number of children: Not on file   Years of education: Not on file   Highest education level: 9th grade  Occupational History   Occupation: retired   Tobacco Use   Smoking status: Every Day    Packs/day: 0.50    Years: 50.00    Total pack years: 25.00    Types: Cigarettes   Smokeless tobacco: Never   Tobacco comments:    0.5 PPD 02/10/2021  Vaping Use   Vaping Use: Former  Substance and Sexual Activity   Alcohol use: No    Alcohol/week: 0.0 standard drinks of alcohol   Drug use: No   Sexual activity: Yes  Other Topics Concern   Not on file  Social History Narrative   Lives at home with husband   Social Determinants of Health   Financial Resource Strain: Low Risk  (03/15/2021)   Overall Financial Resource Strain (CARDIA)    Difficulty of Paying  Living Expenses: Not hard at all  Food Insecurity: No Food Insecurity (03/15/2021)   Hunger Vital Sign    Worried About Running Out of Food in the Last Year: Never true    [place] in the Last Year: Never true  Transportation Needs: No Transportation Needs (03/15/2021)   PRAPARE - Hydrologist (Medical): No    Lack of Transportation (Non-Medical): No  Physical Activity: Inactive (03/15/2021)   Exercise Vital Sign    Days of Exercise per Week: 0 days    Minutes of Exercise per Session: 0 min  Stress: No Stress Concern Present (03/15/2021)   [place]    Feeling of Stress : Not at all  Social Connections: Moderately Isolated (03/15/2021)   Social Connection and Isolation Panel [NHANES]    Frequency of Communication with Friends and Family: More than three times a week    Frequency of Social Gatherings with Friends and Family: More than three times a week    Attends Religious Services: Never    Marine scientist or Organizations: No    Attends Archivist Meetings: Never    Marital Status: Married  Human resources officer Violence: Not At Risk (03/15/2021)   Humiliation, Afraid, Rape, and Kick questionnaire  Fear of Current or Ex-Partner: No    Emotionally Abused: No    Physically Abused: No    Sexually Abused: No    FAMILY HISTORY: Family History  Problem Relation Age of Onset   Congestive Heart Failure Mother    Heart disease Mother    Hypertension Mother    Hypertension Sister    Diabetes Sister    Heart disease Sister    Heart disease Maternal Uncle    Heart disease Maternal Grandmother    Stroke Maternal Grandfather    Cancer Brother        liver, lung   Heart disease Brother    Hypertension Brother    Heart attack Brother    COPD Neg Hx    Breast cancer Neg Hx     ALLERGIES:  is allergic to atorvastatin.  MEDICATIONS:  Current Outpatient Medications   Medication Sig Dispense Refill   acetaminophen (TYLENOL) 500 MG tablet Take 500 mg by mouth every 6 (six) hours as needed for moderate pain.     alendronate (FOSAMAX) 70 MG tablet Take 1 tablet (70 mg total) by mouth every 7 (seven) days. Take with a full glass of water on an empty stomach. 12 tablet 3   chlorhexidine (PERIDEX) 0.12 % solution Use as directed 5 mLs in the mouth or throat 2 (two) times daily. 120 mL 0   clopidogrel (PLAVIX) 75 MG tablet Take 1 tablet (75 mg total) by mouth daily. 90 tablet 3   cyclobenzaprine (FLEXERIL) 10 MG tablet TAKE 1 TABLET AT BEDTIME 60 tablet 3   ferrous sulfate 325 (65 FE) MG EC tablet Take 1 tablet (325 mg total) by mouth daily with breakfast. 90 tablet 4   Fluticasone-Umeclidin-Vilant (TRELEGY ELLIPTA) 100-62.5-25 MCG/INH AEPB Inhale 1 puff into the lungs daily. 1 each 11   gabapentin (NEURONTIN) 100 MG capsule TAKE 1 CAPSULE THREE TIMES DAILY 270 capsule 0   levothyroxine (SYNTHROID) 88 MCG tablet Take 1 tablet (88 mcg total) by mouth daily. 90 tablet 3   lidocaine (LIDODERM) 5 % Place 1 patch onto the skin daily. Remove & Discard patch within 12 hours or as directed by MD 90 patch 1   lidocaine-prilocaine (EMLA) cream Apply 1 application topically as needed. 60 g 3   lisinopril (ZESTRIL) 20 MG tablet TAKE 1 TABLET EVERY DAY 90 tablet 0   Multiple Vitamin (MULTIVITAMIN) tablet Take 1 tablet by mouth daily.     naproxen (NAPROSYN) 500 MG tablet TAKE 1 TABLET TWICE DAILY WITH MEALS 180 tablet 0   omeprazole (PRILOSEC) 20 MG capsule TAKE 1 CAPSULE EVERY DAY 90 capsule 2   ondansetron (ZOFRAN) 8 MG tablet Take 1 tablet (8 mg total) by mouth 2 (two) times daily as needed for refractory nausea / vomiting. Start on day 3 after chemo. 30 tablet 1   vitamin B-12 (CYANOCOBALAMIN) 500 MCG tablet Take 500 mcg by mouth daily.     No current facility-administered medications for this visit.     PHYSICAL EXAMINATION: ECOG PERFORMANCE STATUS: 1 - Symptomatic but  completely ambulatory Vitals:   09/09/21 1016  BP: (!) 142/62  Pulse: 84  Resp: 18  Temp: 97.6 F (36.4 C)   Filed Weights   09/09/21 1016  Weight: 138 lb 14.4 oz (63 kg)    Physical Exam Constitutional:      General: She is not in acute distress. HENT:     Head: Normocephalic and atraumatic.  Eyes:     General: No scleral icterus.  Pupils: Pupils are equal, round, and reactive to light.  Cardiovascular:     Rate and Rhythm: Normal rate and regular rhythm.     Heart sounds: Normal heart sounds.  Pulmonary:     Effort: Pulmonary effort is normal. No respiratory distress.     Breath sounds: No wheezing.     Comments: Decreased breath sound bilaterally  Abdominal:     General: Bowel sounds are normal. There is no distension.     Palpations: Abdomen is soft. There is no mass.     Tenderness: There is no abdominal tenderness.  Musculoskeletal:        General: No deformity. Normal range of motion.     Cervical back: Normal range of motion and neck supple.     Comments: Trace edema bilaterally  Skin:    General: Skin is warm and dry.     Findings: No erythema or rash.  Neurological:     Mental Status: She is alert and oriented to person, place, and time. Mental status is at baseline.     Cranial Nerves: No cranial nerve deficit.     Coordination: Coordination normal.  Psychiatric:        Mood and Affect: Mood normal.     LABORATORY DATA:  I have reviewed the data as listed Lab Results  Component Value Date   WBC 7.5 09/09/2021   HGB 10.4 (L) 09/09/2021   HCT 29.9 (L) 09/09/2021   MCV 94.0 09/09/2021   PLT 274 09/09/2021   Recent Labs    08/12/21 0852 08/26/21 0907 09/09/21 0957  NA 127* 136 130*  K 4.7 4.5 4.6  CL 98 106 95*  CO2 _0 GLUCOSE 98 97 108*  BUN _1 CREATININE 0.97 1.39* 1.12*  CALCIUM 8.4* 8.9 9.1  GFRNONAA >60 40* 52*  PROT 6.4* 6.4* 6.1*  ALBUMIN 3.4* 3.6 3.3*  AST _2 ALT _3 ALKPHOS 63 59 56  BILITOT 0.6  0.9 0.3    Iron/TIBC/Ferritin/ %Sat    Component Value Date/Time   IRON 90 05/04/2021 0929   TIBC 321 05/04/2021 0929   FERRITIN 52 05/04/2021 0929   IRONPCTSAT 28 05/04/2021 0929      RADIOGRAPHIC STUDIES: I have personally reviewed the radiological images as listed and agreed with the findings in the report. CT Chest W Contrast  Result Date: 07/25/2021 CLINICAL DATA:  Restaging lung cancer. No current complaints. * Tracking Code: BO * EXAM: CT CHEST WITH CONTRAST TECHNIQUE: Multidetector CT imaging of the chest was performed during intravenous contrast administration. RADIATION DOSE REDUCTION: This exam was performed according to the departmental dose-optimization program which includes automated exposure control, adjustment of the mA and/or kV according to patient size and/or use of iterative reconstruction technique. CONTRAST:  35m OMNIPAQUE IOHEXOL 300 MG/ML  SOLN COMPARISON:  Chest CT 04/25/2021 and 11/18/2020.  PET-CT 12/13/2020. FINDINGS: Cardiovascular: Left IJ Port-A-Cath extends to the upper right atrium. There is atherosclerosis of the aorta, great vessels and coronary arteries. No acute vascular findings. The heart size is normal. There is no pericardial effusion. Mediastinum/Nodes: Stable 9 mm right hilar node on image 58/2. No recurrent mediastinal, hilar or axillary adenopathy. The thyroid gland, trachea and esophagus demonstrate no significant findings. Lungs/Pleura: No pleural effusion or pneumothorax. Severe centrilobular paraseptal emphysema again noted with chronic radiation changes posteriorly in the right upper lobe and superior segment of the right lower lobe. 3 mm left lower lobe pulmonary nodule on  image 96/3 appears slightly more prominent, although grossly unchanged from 11/18/2020. This measures up to 5 mm on sagittal image 131/6. Stable calcified right lower lobe granuloma. No other enlarging or new nodules identified. Upper abdomen: The visualized upper abdomen  appears stable without significant findings. Musculoskeletal/Chest wall: There is no chest wall mass or suspicious osseous finding. Mild spondylosis. IMPRESSION: 1. Questionable slight enlargement a small left lower lobe nodule measuring up to 5 mm of the sagittal images compared with the most recent prior study. Attention on follow-up in approximately 6 months recommended. 2. Otherwise stable chest CT with stable radiation changes superiorly in the right hemithorax and small residual right hilar lymph node. 3. Coronary and aortic atherosclerosis (ICD10-I70.0). Emphysema (ICD10-J43.9). Electronically Signed   By: Richardean Sale M.D.   On: 07/25/2021 16:00

## 2021-09-10 NOTE — Progress Notes (Signed)
ON PATHWAY REGIMEN - Non-Small Cell Lung  No Change  Continue With Treatment as Ordered.  Original Decision Date/Time: 05/04/2021 13:09     A cycle is every 14 days:     Durvalumab   **Always confirm dose/schedule in your pharmacy ordering system**  Patient Characteristics: Preoperative or Nonsurgical Candidate (Clinical Staging), Stage III - Nonsurgical Candidate (Nonsquamous and Squamous), PS = 0, 1 Therapeutic Status: Preoperative or Nonsurgical Candidate (Clinical Staging) AJCC T Category: cTX AJCC N Category: cNX AJCC M Category: cM0 AJCC 8 Stage Grouping: IIIA ECOG Performance Status: 1 Intent of Therapy: Curative Intent, Discussed with Patient

## 2021-09-10 NOTE — Assessment & Plan Note (Signed)
Recommend smoke cessation.  

## 2021-09-10 NOTE — Assessment & Plan Note (Signed)
Stage III right lung adenocarcinoma,  Recurrent S/p concurrent chemotherapy and radiation- On immunotherapy durvalumab maintenance Labs reviewed and discussed with patient.  Proceed with Durvalumab treatments.

## 2021-09-10 NOTE — Assessment & Plan Note (Signed)
Treatment plan was listed above

## 2021-09-14 ENCOUNTER — Other Ambulatory Visit: Payer: Self-pay | Admitting: Family Medicine

## 2021-09-15 NOTE — Telephone Encounter (Signed)
Requested medication (s) are due for refill today: yes  Requested medication (s) are on the active medication list: yes  Last refill:  01/13/21  Future visit scheduled: yes  Notes to clinic:  Unable to refill per protocol, cannot delegate.      Requested Prescriptions  Pending Prescriptions Disp Refills   cyclobenzaprine (FLEXERIL) 10 MG tablet [Pharmacy Med Name: CYCLOBENZAPRINE HYDROCHLORIDE 10 MG Tablet] 90 tablet     Sig: TAKE 1 TABLET AT BEDTIME     Not Delegated - Analgesics:  Muscle Relaxants Failed - 09/14/2021  2:36 PM      Failed - This refill cannot be delegated      Passed - Valid encounter within last 6 months    Recent Outpatient Visits           5 months ago LUQ pain   [place], [place], DO   8 months ago Essential hypertension   [place], [place], DO   10 months ago Essential hypertension   [place], [place], DO   1 year ago Grief   [place], [place], DO   1 year ago [place], [place], DO

## 2021-09-23 ENCOUNTER — Encounter: Payer: Self-pay | Admitting: Oncology

## 2021-09-23 ENCOUNTER — Inpatient Hospital Stay: Payer: Medicare PPO

## 2021-09-23 ENCOUNTER — Inpatient Hospital Stay (HOSPITAL_BASED_OUTPATIENT_CLINIC_OR_DEPARTMENT_OTHER): Payer: Medicare PPO | Admitting: Oncology

## 2021-09-23 VITALS — BP 155/64 | HR 85 | Temp 96.9°F | Resp 15 | Wt 140.5 lb

## 2021-09-23 DIAGNOSIS — N183 Chronic kidney disease, stage 3 unspecified: Secondary | ICD-10-CM | POA: Diagnosis not present

## 2021-09-23 DIAGNOSIS — C539 Malignant neoplasm of cervix uteri, unspecified: Secondary | ICD-10-CM

## 2021-09-23 DIAGNOSIS — Z720 Tobacco use: Secondary | ICD-10-CM | POA: Diagnosis not present

## 2021-09-23 DIAGNOSIS — Z5112 Encounter for antineoplastic immunotherapy: Secondary | ICD-10-CM | POA: Diagnosis not present

## 2021-09-23 DIAGNOSIS — N1831 Chronic kidney disease, stage 3a: Secondary | ICD-10-CM

## 2021-09-23 DIAGNOSIS — C3491 Malignant neoplasm of unspecified part of right bronchus or lung: Secondary | ICD-10-CM

## 2021-09-23 DIAGNOSIS — I129 Hypertensive chronic kidney disease with stage 1 through stage 4 chronic kidney disease, or unspecified chronic kidney disease: Secondary | ICD-10-CM | POA: Diagnosis not present

## 2021-09-23 DIAGNOSIS — Z79899 Other long term (current) drug therapy: Secondary | ICD-10-CM | POA: Diagnosis not present

## 2021-09-23 LAB — CBC WITH DIFFERENTIAL/PLATELET
Abs Immature Granulocytes: 0.06 10*3/uL (ref 0.00–0.07)
Basophils Absolute: 0.1 10*3/uL (ref 0.0–0.1)
Basophils Relative: 1 %
Eosinophils Absolute: 0.4 10*3/uL (ref 0.0–0.5)
Eosinophils Relative: 5 %
HCT: 29.2 % — ABNORMAL LOW (ref 36.0–46.0)
Hemoglobin: 10.3 g/dL — ABNORMAL LOW (ref 12.0–15.0)
Immature Granulocytes: 1 %
Lymphocytes Relative: 7 %
Lymphs Abs: 0.7 10*3/uL (ref 0.7–4.0)
MCH: 33.7 pg (ref 26.0–34.0)
MCHC: 35.3 g/dL (ref 30.0–36.0)
MCV: 95.4 fL (ref 80.0–100.0)
Monocytes Absolute: 0.7 10*3/uL (ref 0.1–1.0)
Monocytes Relative: 7 %
Neutro Abs: 7.7 10*3/uL (ref 1.7–7.7)
Neutrophils Relative %: 79 %
Platelets: 278 10*3/uL (ref 150–400)
RBC: 3.06 MIL/uL — ABNORMAL LOW (ref 3.87–5.11)
RDW: 13.8 % (ref 11.5–15.5)
WBC: 9.6 10*3/uL (ref 4.0–10.5)
nRBC: 0 % (ref 0.0–0.2)

## 2021-09-23 LAB — COMPREHENSIVE METABOLIC PANEL
ALT: 14 U/L (ref 0–44)
AST: 18 U/L (ref 15–41)
Albumin: 3.2 g/dL — ABNORMAL LOW (ref 3.5–5.0)
Alkaline Phosphatase: 56 U/L (ref 38–126)
Anion gap: 7 (ref 5–15)
BUN: 21 mg/dL (ref 8–23)
CO2: 22 mmol/L (ref 22–32)
Calcium: 8.5 mg/dL — ABNORMAL LOW (ref 8.9–10.3)
Chloride: 101 mmol/L (ref 98–111)
Creatinine, Ser: 1.13 mg/dL — ABNORMAL HIGH (ref 0.44–1.00)
GFR, Estimated: 51 mL/min — ABNORMAL LOW (ref >60)
Glucose, Bld: 107 mg/dL — ABNORMAL HIGH (ref 70–99)
Potassium: 4.1 mmol/L (ref 3.5–5.1)
Sodium: 130 mmol/L — ABNORMAL LOW (ref 135–145)
Total Bilirubin: 0.6 mg/dL (ref 0.3–1.2)
Total Protein: 5.8 g/dL — ABNORMAL LOW (ref 6.5–8.1)

## 2021-09-23 MED ORDER — SODIUM CHLORIDE 0.9 % IV SOLN
10.0000 mg/kg | Freq: Once | INTRAVENOUS | Status: DC
  Filled 2021-09-23: qty 12.4

## 2021-09-23 MED ORDER — TRAMADOL HCL 50 MG PO TABS: 50.0000 mg | ORAL_TABLET | Freq: Two times a day (BID) | ORAL | 0 refills | Status: DC | PRN

## 2021-09-23 MED ORDER — HEPARIN SOD (PORK) LOCK FLUSH 100 UNIT/ML IV SOLN
500.0000 [IU] | Freq: Once | INTRAVENOUS | Status: AC | PRN
  Administered 2021-09-23: 500 [IU]
  Filled 2021-09-23: qty 5

## 2021-09-23 MED ORDER — SODIUM CHLORIDE 0.9 % IV SOLN
Freq: Once | INTRAVENOUS | Status: AC
  Filled 2021-09-23: qty 250

## 2021-09-23 MED ORDER — SODIUM CHLORIDE 0.9 % IV SOLN
500.0000 mg | Freq: Once | INTRAVENOUS | Status: AC
  Administered 2021-09-23: 500 mg via INTRAVENOUS
  Filled 2021-09-23: qty 10

## 2021-09-23 NOTE — Patient Instructions (Signed)
[facility] CANCER CTR AT [place]  Discharge Instructions: Thank you for choosing [place] to provide your oncology and hematology care.  If you have a lab appointment with the [place], please go directly to the [place] and check in at the registration area.  Wear comfortable clothing and clothing appropriate for easy access to any Portacath or PICC line.   We strive to give you quality time with your provider. You may need to reschedule your appointment if you arrive late (15 or more minutes).  Arriving late affects you and other patients whose appointments are after yours.  Also, if you miss three or more appointments without notifying the office, you may be dismissed from the clinic at the provider's discretion.      For prescription refill requests, have your pharmacy contact our office and allow 72 hours for refills to be completed.    Today you received the following chemotherapy and/or immunotherapy agents: Imfinzi      To help prevent nausea and vomiting after your treatment, we encourage you to take your nausea medication as directed.  BELOW ARE SYMPTOMS THAT SHOULD BE REPORTED IMMEDIATELY: *FEVER GREATER THAN 100.4 F (38 C) OR HIGHER *CHILLS OR SWEATING *NAUSEA AND VOMITING THAT IS NOT CONTROLLED WITH YOUR NAUSEA MEDICATION *UNUSUAL SHORTNESS OF BREATH *UNUSUAL BRUISING OR BLEEDING *URINARY PROBLEMS (pain or burning when urinating, or frequent urination) *BOWEL PROBLEMS (unusual diarrhea, constipation, pain near the anus) TENDERNESS IN MOUTH AND THROAT WITH OR WITHOUT PRESENCE OF ULCERS (sore throat, sores in mouth, or a toothache) UNUSUAL RASH, SWELLING OR PAIN  UNUSUAL VAGINAL DISCHARGE OR ITCHING   Items with * indicate a potential emergency and should be followed up as soon as possible or go to the Emergency Department if any problems should occur.  Please show the CHEMOTHERAPY ALERT CARD or IMMUNOTHERAPY ALERT CARD at check-in to  the Emergency Department and triage nurse.  Should you have questions after your visit or need to cancel or reschedule your appointment, please contact [facility] CANCER [place] AT [place]  [phone] and follow the prompts.  Office hours are 8:00 a.m. to 4:30 p.m. Monday - Friday. Please note that voicemails left after 4:00 p.m. may not be returned until the following business day.  We are closed weekends and major holidays. You have access to a nurse at all times for urgent questions. Please call the main number to the clinic [phone] and follow the prompts.  For any non-urgent questions, you may also contact your provider using MyChart. We now offer e-Visits for anyone 57 and older to request care online for non-urgent symptoms. For details visit [email or web address].   Also download the MyChart app! Go to the app store, search "MyChart", open the app, select [place], and log in with your MyChart username and password.  Masks are optional in the cancer centers. If you would like for your care team to wear a mask while they are taking care of you, please let them know. For doctor visits, patients may have with them one support person who is at least 74 years old. At this time, visitors are not allowed in the infusion area.

## 2021-09-23 NOTE — Progress Notes (Unsigned)
Pt here for follow up. Pt reports pain to top of her stomach.

## 2021-09-23 NOTE — Progress Notes (Signed)
Per MD will ok to give 500mg  Imfinzi today due to drug supply. Will continue with full dose 620mg  at next infusion.

## 2021-09-24 ENCOUNTER — Encounter: Payer: Self-pay | Admitting: Oncology

## 2021-09-24 DIAGNOSIS — N183 Chronic kidney disease, stage 3 unspecified: Secondary | ICD-10-CM | POA: Insufficient documentation

## 2021-09-24 NOTE — Assessment & Plan Note (Addendum)
Stage III right lung adenocarcinoma,  Recurrent S/p concurrent chemotherapy and radiation- On immunotherapy durvalumab maintenance Labs reviewed and discussed with patient.  Proceed with Durvalumab treatments. Obtain CT in Sept 2023

## 2021-09-24 NOTE — Assessment & Plan Note (Signed)
Locally advanced cervical cancer stage IIIC1 with pelvic nodes involvement. S/p concurrent chemotherapy and radiation followed by brachytherapy.  She is not actively following up with gynecology oncology.

## 2021-09-24 NOTE — Assessment & Plan Note (Signed)
Monitor creatinine levels.  Avoid nephrotoxins. Encourage oral hydration.

## 2021-09-24 NOTE — Progress Notes (Signed)
Hematology/Oncology Progress note [phone] [phone]      Patient Care Team: [name], DO as PCP - General (Family Medicine) Clent Jacks, RN as Oncology Nurse Navigator Noreene Filbert, MD as Radiation Oncologist (Radiation Oncology) Telford Nab, RN as Oncology Nurse Navigator [name], MD as Consulting Physician (Oncology)  ASSESSMENT & PLAN:   Cancer Staging  Cervical cancer [facility]) Staging form: Cervix Uteri, AJCC Version 9 - Clinical: FIGO Stage IIICr (cTX, cN1) - Unsigned  Primary lung adenocarcinoma ([place]) Staging form: Lung, AJCC 7th Edition - Clinical stage from 01/12/2021: T1, N1, M0 - Signed by [name], MD on 01/29/2021   Primary lung adenocarcinoma ([place]) Stage III right lung adenocarcinoma,  Recurrent S/p concurrent chemotherapy and radiation- On immunotherapy durvalumab maintenance Labs reviewed and discussed with patient.  Proceed with Durvalumab treatments. Obtain CT in Sept 2023  Tobacco use Recommend smoke cessation.  Encounter for antineoplastic immunotherapy Treatment plan was listed above  Cervical cancer ([place]) Locally advanced cervical cancer stage IIIC1 with pelvic nodes involvement. S/p concurrent chemotherapy and radiation followed by brachytherapy.  She is not actively following up with gynecology oncology.    CKD (chronic kidney disease) stage 3, GFR 30-59 ml/min (HCC) Monitor creatinine levels.  Avoid nephrotoxins. Encourage oral hydration.    No orders of the defined types were placed in this encounter.   Lab md 2 weeks durvalumab All questions were answered. The patient knows to call the clinic with any problems, questions or concerns.  [name], MD, PhD [facility] Hematology Oncology 09/23/2021     CHIEF COMPLAINTS/REASON FOR VISIT:  Follow up lung cancer treatments  HISTORY OF PRESENTING ILLNESS:  # Cervix cancer Patient has developed postmenopausal vaginal bleeding and discharge.   She was noted to have a friable cervix/2 cm mass of the cervix, concerning for malignancy 12/19/2018 endometrial biopsy showed scattered atypical squamous cells suspicious for malignancy.  Predominantly necrosis with associated inflammation. 01/01/2019 initial cervix biopsy showed at least high-grade squamous intraepithelial lesion.-HPV negative 01/22/2019, repeat vaginal wall biopsy and cervix 9:00 biopsy showed squamous cell carcinoma.    Staging images 12/31/2018 showed fluid distending the endometrial canal and or endometrial thickening to the level of cervix.  No evidence of pathological lymphadenopathy.  Haziness about the lower cervix.  7 mm irregular pulmonary nodule within the right upper lobe, suspicious for possible primary or metastatic malignancy.  Chronic pleural-parenchymal scarring/fibrosis at the lung apices.  Large amount of stool.  No bowel obstruction. 01/08/2019 PET scan showed marked hypermetabolism in the region of the cervix, compatible with the reported history of cervical cancer. Small bilateral pelvic sidewall lymph nodes discernible FDG accumulation, concerning for metastatic disease although neither lymph node is enlarged by CT size criteria. 7 mm nodule in the right upper lobe shows discernible FDG accumulation.  Questionable neoplasm, primary versus metastatic. Emphysema.   #Chronic hearing loss  # Lung nodule, FDG avid, questionable primary bronchogenic carcinoma versus metastatic cancer.Discussed with radiation oncology.  Her case was also discussed on tumor board.  Consensus reached on finishing concurrent chemoradiation treatments for cervix cancer first. Possible SBRT to lung nodule for presumed primary lung cancer.  # 02/20/2020 - 03/19/2020 concurrent chemotherapy weekly carboplatin and taxol and radiation for treatment of this cervix cancer.   #December 2021 lung nodule,was evaluated by Dr.Oaks. Options of biopsy via transthoracic or endobronchial approach vs  surgical resection.  Patient opted to empiric SBRT  Patient was last seen by me on 06/25/2019.  Lost follow-up. She continues to  follow with radiation oncology. 11/20/2020, CT chest with contrast showed minimal residual of previously seen right upper lobe nodule.  0.6 x 0.4 cm.  Internal development of mild bandlike radiation fibrosis.  Newly enlarged pretracheal lymph node, measuring 2.5 x 1.7 cm.  Highly concerning for metastatic disease.  Unchanged prominent AP window and the right hilar lymph nodes.  Emphysema and diffuse bilateral bronchial wall thickening.  Coronary artery disease. 12/13/2020, PET scan showed enlarging hypermetabolic right lower paratracheal lymph node, compatible with metastatic disease.  SUV 10.3.  Mild FDG uptake of the bilateral sacral alla with associated lucency.  Concerning for sacral insufficiency fracture.  Stable posttreatment changes of the right upper lobe nodule.  Nonspecific small solid 3 mm pulmonary nodule of the left lower lobe.  2 small to be characterized.  No evidence of FDG avid metastatic disease in the abdomen or pelvis.  Aortic atherosclerosis and emphysema.  Patient reestablish care on 12/27/2020 for lung cancer treatments   #01/12/2021 patient underwent bronchoscopy biopsy by Dr. Patsey Berthold Right paratracheal lymph node fine-needle aspiration is positive for metastatic non-small cell carcinoma, favor adenocarcinoma of lung origin.  Circulogen NGS negative PD-L1, ALK, ROS1, NTRK1/2/3,   # 02/14/2021-03/31/2021, concurrent carboplatin AUC 2 and taxol 67m/m2 with radiation.  04/26/2021, CT chest with contrast showed evolving postradiation changes in the posterior perifissural upper right lung without discrete residual measurable nodule in this location.  Right hilar/mediastinal lymphadenopathy stable to decreased.  Tiny 0.2 cm left lower lobe pulmonary nodule slightly decreased.  Positive response to treatment.  No new or progressive disease.  Three-vessel  coronary arthrosclerosis.  Aortic atherosclerosis/emphysema.  INTERVAL HISTORY Mary KAASis a 76y.o. female who has above history reviewed by me today presents for follow up visit for management of possible recurrent lung cancer.  History of cervix cancer.    No new complaints. She tolerates immunotherapy well.  Occasionally she has mild diarrhea, resolves after taking imodium.  Denies any SOB, cough, chest pain,  rash.  Review of Systems  Constitutional:  Positive for fatigue. Negative for appetite change, chills, fever and unexpected weight change.  HENT:   Positive for hearing loss. Negative for voice change.   Eyes:  Negative for eye problems.  Respiratory:  Negative for chest tightness and cough.   Cardiovascular:  Negative for chest pain.  Gastrointestinal:  Negative for abdominal distention, abdominal pain and blood in stool.  Endocrine: Negative for hot flashes.  Genitourinary:  Negative for difficulty urinating and frequency.   Musculoskeletal:  Negative for arthralgias.  Skin:  Negative for itching and rash.  Neurological:  Negative for extremity weakness.  Hematological:  Negative for adenopathy.  Psychiatric/Behavioral:  Negative for confusion.     MEDICAL HISTORY:  Past Medical History:  Diagnosis Date   Arthritis    Cancer ([facility]    Carotid atherosclerosis    Carotid bruit    Cervical cancer (HCC)    COPD (chronic obstructive pulmonary disease) (HCC)    emphysema   Coronary artery disease    Family history of adverse reaction to anesthesia    paternal grandfather died during surgery-pt unaware what happened   GERD (gastroesophageal reflux disease)    Hyperlipidemia    Hypertension    Hyponatremia 02/27/2019   Hypothyroidism    Infected cat bite 1980s   was hospitalized   Irregular heartbeat    Medical history non-contributory    Peripheral vascular disease ([place]    Tobacco use     SURGICAL HISTORY: Past Surgical History:  Procedure Laterality  Date   CAROTID ENDARTERECTOMY Right Oct. 2015   Dr. Lucky Cowboy   CAROTID STENOSIS Left April 2016   carotid stenosis surgery   COLONOSCOPY WITH PROPOFOL N/A 11/08/2016   Procedure: COLONOSCOPY WITH PROPOFOL;  Surgeon: Jonathon Bellows, MD;  Location: [facility] ENDOSCOPY;  Service: Gastroenterology;  Laterality: N/A;   ESOPHAGOGASTRODUODENOSCOPY (EGD) WITH PROPOFOL N/A 11/08/2016   Procedure: ESOPHAGOGASTRODUODENOSCOPY (EGD) WITH PROPOFOL;  Surgeon: Jonathon Bellows, MD;  Location: [facility] ENDOSCOPY;  Service: Gastroenterology;  Laterality: N/A;   FLEXIBLE BRONCHOSCOPY N/A 12/17/2014   Procedure: FLEXIBLE BRONCHOSCOPY;  Surgeon: Wilhelmina Mcardle, MD;  Location: [facility] ORS;  Service: Pulmonary;  Laterality: N/A;   INCISION AND DRAINAGE / EXCISION THYROGLOSSAL CYST  April 2011   PORTA CATH INSERTION N/A 03/03/2021   Procedure: PORTA CATH INSERTION;  Surgeon: Algernon Huxley, MD;  Location: [place] CV LAB;  Service: Cardiovascular;  Laterality: N/A;   VIDEO BRONCHOSCOPY WITH ENDOBRONCHIAL ULTRASOUND N/A 01/12/2021   Procedure: VIDEO BRONCHOSCOPY WITH ENDOBRONCHIAL ULTRASOUND;  Surgeon: Tyler Pita, MD;  Location: [facility] ORS;  Service: Cardiopulmonary;  Laterality: N/A;    SOCIAL HISTORY: Social History   Socioeconomic History   Marital status: Married    Spouse name: Deidre Ala   Number of children: Not on file   Years of education: Not on file   Highest education level: 9th grade  Occupational History   Occupation: retired   Tobacco Use   Smoking status: Every Day    Packs/day: 0.50    Years: 50.00    Total pack years: 25.00    Types: Cigarettes   Smokeless tobacco: Never   Tobacco comments:    0.5 PPD 02/10/2021  Vaping Use   Vaping Use: Former  Substance and Sexual Activity   Alcohol use: No    Alcohol/week: 0.0 standard drinks of alcohol   Drug use: No   Sexual activity: Yes  Other Topics Concern   Not on file  Social History Narrative   Lives at home with husband   Social Determinants of  Health   Financial Resource Strain: Low Risk  (03/15/2021)   Overall Financial Resource Strain (CARDIA)    Difficulty of Paying Living Expenses: Not hard at all  Food Insecurity: No Food Insecurity (03/15/2021)   Hunger Vital Sign    Worried About Running Out of Food in the Last Year: Never true    [place] in the Last Year: Never true  Transportation Needs: No Transportation Needs (03/15/2021)   PRAPARE - Hydrologist (Medical): No    Lack of Transportation (Non-Medical): No  Physical Activity: Inactive (03/15/2021)   Exercise Vital Sign    Days of Exercise per Week: 0 days    Minutes of Exercise per Session: 0 min  Stress: No Stress Concern Present (03/15/2021)   [place]    Feeling of Stress : Not at all  Social Connections: Moderately Isolated (03/15/2021)   Social Connection and Isolation Panel [NHANES]    Frequency of Communication with Friends and Family: More than three times a week    Frequency of Social Gatherings with Friends and Family: More than three times a week    Attends Religious Services: Never    Marine scientist or Organizations: No    Attends Archivist Meetings: Never    Marital Status: Married  Human resources officer Violence: Not At Risk (03/15/2021)   Humiliation, Afraid, Rape, and Kick questionnaire  Fear of Current or Ex-Partner: No    Emotionally Abused: No    Physically Abused: No    Sexually Abused: No    FAMILY HISTORY: Family History  Problem Relation Age of Onset   Congestive Heart Failure Mother    Heart disease Mother    Hypertension Mother    Hypertension Sister    Diabetes Sister    Heart disease Sister    Heart disease Maternal Uncle    Heart disease Maternal Grandmother    Stroke Maternal Grandfather    Cancer Brother        liver, lung   Heart disease Brother    Hypertension Brother    Heart attack Brother    COPD Neg  Hx    Breast cancer Neg Hx     ALLERGIES:  is allergic to atorvastatin.  MEDICATIONS:  Current Outpatient Medications  Medication Sig Dispense Refill   acetaminophen (TYLENOL) 500 MG tablet Take 500 mg by mouth every 6 (six) hours as needed for moderate pain.     alendronate (FOSAMAX) 70 MG tablet Take 1 tablet (70 mg total) by mouth every 7 (seven) days. Take with a full glass of water on an empty stomach. 12 tablet 3   chlorhexidine (PERIDEX) 0.12 % solution Use as directed 5 mLs in the mouth or throat 2 (two) times daily. 120 mL 0   clopidogrel (PLAVIX) 75 MG tablet Take 1 tablet (75 mg total) by mouth daily. 90 tablet 3   cyclobenzaprine (FLEXERIL) 10 MG tablet TAKE 1 TABLET AT BEDTIME 90 tablet 1   ferrous sulfate 325 (65 FE) MG EC tablet Take 1 tablet (325 mg total) by mouth daily with breakfast. 90 tablet 4   Fluticasone-Umeclidin-Vilant (TRELEGY ELLIPTA) 100-62.5-25 MCG/INH AEPB Inhale 1 puff into the lungs daily. 1 each 11   gabapentin (NEURONTIN) 100 MG capsule TAKE 1 CAPSULE THREE TIMES DAILY 270 capsule 0   levothyroxine (SYNTHROID) 88 MCG tablet Take 1 tablet (88 mcg total) by mouth daily. 90 tablet 3   lidocaine (LIDODERM) 5 % Place 1 patch onto the skin daily. Remove & Discard patch within 12 hours or as directed by MD 90 patch 1   lidocaine-prilocaine (EMLA) cream Apply 1 application topically as needed. 60 g 3   lisinopril (ZESTRIL) 20 MG tablet TAKE 1 TABLET EVERY DAY 90 tablet 0   Multiple Vitamin (MULTIVITAMIN) tablet Take 1 tablet by mouth daily.     omeprazole (PRILOSEC) 20 MG capsule TAKE 1 CAPSULE EVERY DAY 90 capsule 2   ondansetron (ZOFRAN) 8 MG tablet Take 1 tablet (8 mg total) by mouth 2 (two) times daily as needed for refractory nausea / vomiting. Start on day 3 after chemo. 30 tablet 1   traMADol (ULTRAM) 50 MG tablet Take 1-2 tablets (50-100 mg total) by mouth every 12 (twelve) hours as needed. 60 tablet 0   vitamin B-12 (CYANOCOBALAMIN) 500 MCG tablet Take 500  mcg by mouth daily.     No current facility-administered medications for this visit.     PHYSICAL EXAMINATION: ECOG PERFORMANCE STATUS: 1 - Symptomatic but completely ambulatory Vitals:   09/23/21 0901  BP: (!) 155/64  Pulse: 85  Resp: 15  Temp: (!) 96.9 F (36.1 C)  SpO2: 100%   Filed Weights   09/23/21 0901  Weight: 140 lb 8 oz (63.7 kg)    Physical Exam Constitutional:      General: She is not in acute distress. HENT:     Head: Normocephalic and atraumatic.  Eyes:     General: No scleral icterus.    Pupils: Pupils are equal, round, and reactive to light.  Cardiovascular:     Rate and Rhythm: Normal rate.  Pulmonary:     Effort: Pulmonary effort is normal. No respiratory distress.     Breath sounds: No wheezing.     Comments: Decreased breath sound bilaterally  Abdominal:     General: Bowel sounds are normal. There is no distension.     Palpations: Abdomen is soft.  Musculoskeletal:        General: No deformity. Normal range of motion.     Cervical back: Normal range of motion and neck supple.     Comments: Trace edema bilaterally  Skin:    General: Skin is warm and dry.     Findings: No erythema or rash.  Neurological:     Mental Status: She is alert and oriented to person, place, and time. Mental status is at baseline.     Cranial Nerves: No cranial nerve deficit.     Coordination: Coordination normal.  Psychiatric:        Mood and Affect: Mood normal.     LABORATORY DATA:  I have reviewed the data as listed    Latest Ref Rng & Units 09/23/2021    8:48 AM 09/09/2021    9:57 AM 08/26/2021    9:07 AM  CBC  WBC 4.0 - 10.5 K/uL 9.6  7.5  6.7   Hemoglobin 12.0 - 15.0 g/dL 10.3  10.4  10.2   Hematocrit 36.0 - 46.0 % 29.2  29.9  30.0   Platelets 150 - 400 K/uL 278  274  266       Latest Ref Rng & Units 09/23/2021    8:48 AM 09/09/2021    9:57 AM 08/26/2021    9:07 AM  CMP  Glucose 70 - 99 mg/dL 107  108  97   BUN 8 - 23 mg/dL _0 Creatinine  0.44 - 1.00 mg/dL 1.13  1.12  1.39   Sodium 135 - 145 mmol/L 130  130  136   Potassium 3.5 - 5.1 mmol/L 4.1  4.6  4.5   Chloride 98 - 111 mmol/L 101  95  106   CO2 22 - 32 mmol/L _1 Calcium 8.9 - 10.3 mg/dL 8.5  9.1  8.9   Total Protein 6.5 - 8.1 g/dL 5.8  6.1  6.4   Total Bilirubin 0.3 - 1.2 mg/dL 0.6  0.3  0.9   Alkaline Phos 38 - 126 U/L 56  56  59   AST 15 - 41 U/L _2 ALT 0 - 44 U/L _3 Iron/TIBC/Ferritin/ %Sat    Component Value Date/Time   IRON 90 05/04/2021 0929   TIBC 321 05/04/2021 0929   FERRITIN 52 05/04/2021 0929   IRONPCTSAT 28 05/04/2021 0929      RADIOGRAPHIC STUDIES: I have personally reviewed the radiological images as listed and agreed with the findings in the report. CT Chest W Contrast  Result Date: 07/25/2021 CLINICAL DATA:  Restaging lung cancer. No current complaints. * Tracking Code: BO * EXAM: CT CHEST WITH CONTRAST TECHNIQUE: Multidetector CT imaging of the chest was performed during intravenous contrast administration. RADIATION DOSE REDUCTION: This exam was performed according to the departmental dose-optimization program which includes automated exposure control, adjustment of the mA and/or  kV according to patient size and/or use of iterative reconstruction technique. CONTRAST:  7m OMNIPAQUE IOHEXOL 300 MG/ML  SOLN COMPARISON:  Chest CT 04/25/2021 and 11/18/2020.  PET-CT 12/13/2020. FINDINGS: Cardiovascular: Left IJ Port-A-Cath extends to the upper right atrium. There is atherosclerosis of the aorta, great vessels and coronary arteries. No acute vascular findings. The heart size is normal. There is no pericardial effusion. Mediastinum/Nodes: Stable 9 mm right hilar node on image 58/2. No recurrent mediastinal, hilar or axillary adenopathy. The thyroid gland, trachea and esophagus demonstrate no significant findings. Lungs/Pleura: No pleural effusion or pneumothorax. Severe centrilobular paraseptal emphysema again noted with  chronic radiation changes posteriorly in the right upper lobe and superior segment of the right lower lobe. 3 mm left lower lobe pulmonary nodule on image 96/3 appears slightly more prominent, although grossly unchanged from 11/18/2020. This measures up to 5 mm on sagittal image 131/6. Stable calcified right lower lobe granuloma. No other enlarging or new nodules identified. Upper abdomen: The visualized upper abdomen appears stable without significant findings. Musculoskeletal/Chest wall: There is no chest wall mass or suspicious osseous finding. Mild spondylosis. IMPRESSION: 1. Questionable slight enlargement a small left lower lobe nodule measuring up to 5 mm of the sagittal images compared with the most recent prior study. Attention on follow-up in approximately 6 months recommended. 2. Otherwise stable chest CT with stable radiation changes superiorly in the right hemithorax and small residual right hilar lymph node. 3. Coronary and aortic atherosclerosis (ICD10-I70.0). Emphysema (ICD10-J43.9). Electronically Signed   By: WRichardean SaleM.D.   On: 07/25/2021 16:00

## 2021-09-24 NOTE — Assessment & Plan Note (Signed)
Treatment plan was listed above

## 2021-09-24 NOTE — Assessment & Plan Note (Signed)
Recommend smoke cessation.  

## 2021-10-07 ENCOUNTER — Encounter: Payer: Self-pay | Admitting: Oncology

## 2021-10-07 ENCOUNTER — Inpatient Hospital Stay: Payer: Medicare PPO | Attending: Oncology | Admitting: Oncology

## 2021-10-07 ENCOUNTER — Inpatient Hospital Stay: Payer: Medicare PPO

## 2021-10-07 DIAGNOSIS — F17210 Nicotine dependence, cigarettes, uncomplicated: Secondary | ICD-10-CM | POA: Diagnosis not present

## 2021-10-07 DIAGNOSIS — C3491 Malignant neoplasm of unspecified part of right bronchus or lung: Secondary | ICD-10-CM

## 2021-10-07 DIAGNOSIS — C539 Malignant neoplasm of cervix uteri, unspecified: Secondary | ICD-10-CM

## 2021-10-07 DIAGNOSIS — D631 Anemia in chronic kidney disease: Secondary | ICD-10-CM

## 2021-10-07 DIAGNOSIS — M549 Dorsalgia, unspecified: Secondary | ICD-10-CM | POA: Diagnosis not present

## 2021-10-07 DIAGNOSIS — Z720 Tobacco use: Secondary | ICD-10-CM | POA: Diagnosis not present

## 2021-10-07 DIAGNOSIS — Z5112 Encounter for antineoplastic immunotherapy: Secondary | ICD-10-CM | POA: Diagnosis not present

## 2021-10-07 DIAGNOSIS — N1831 Chronic kidney disease, stage 3a: Secondary | ICD-10-CM

## 2021-10-07 DIAGNOSIS — N183 Chronic kidney disease, stage 3 unspecified: Secondary | ICD-10-CM | POA: Diagnosis not present

## 2021-10-07 DIAGNOSIS — Z79899 Other long term (current) drug therapy: Secondary | ICD-10-CM | POA: Diagnosis not present

## 2021-10-07 DIAGNOSIS — I129 Hypertensive chronic kidney disease with stage 1 through stage 4 chronic kidney disease, or unspecified chronic kidney disease: Secondary | ICD-10-CM | POA: Diagnosis not present

## 2021-10-07 LAB — COMPREHENSIVE METABOLIC PANEL
ALT: 10 U/L (ref 0–44)
AST: 18 U/L (ref 15–41)
Albumin: 3.2 g/dL — ABNORMAL LOW (ref 3.5–5.0)
Alkaline Phosphatase: 62 U/L (ref 38–126)
Anion gap: 5 (ref 5–15)
BUN: 14 mg/dL (ref 8–23)
CO2: 26 mmol/L (ref 22–32)
Calcium: 8.6 mg/dL — ABNORMAL LOW (ref 8.9–10.3)
Chloride: 102 mmol/L (ref 98–111)
Creatinine, Ser: 1.05 mg/dL — ABNORMAL HIGH (ref 0.44–1.00)
GFR, Estimated: 56 mL/min — ABNORMAL LOW (ref >60)
Glucose, Bld: 111 mg/dL — ABNORMAL HIGH (ref 70–99)
Potassium: 4.2 mmol/L (ref 3.5–5.1)
Sodium: 133 mmol/L — ABNORMAL LOW (ref 135–145)
Total Bilirubin: 0.3 mg/dL (ref 0.3–1.2)
Total Protein: 6.1 g/dL — ABNORMAL LOW (ref 6.5–8.1)

## 2021-10-07 LAB — CBC WITH DIFFERENTIAL/PLATELET
Abs Immature Granulocytes: 0.03 10*3/uL (ref 0.00–0.07)
Basophils Absolute: 0 10*3/uL (ref 0.0–0.1)
Basophils Relative: 1 %
Eosinophils Absolute: 0.3 10*3/uL (ref 0.0–0.5)
Eosinophils Relative: 4 %
HCT: 28.9 % — ABNORMAL LOW (ref 36.0–46.0)
Hemoglobin: 9.8 g/dL — ABNORMAL LOW (ref 12.0–15.0)
Immature Granulocytes: 1 %
Lymphocytes Relative: 7 %
Lymphs Abs: 0.5 10*3/uL — ABNORMAL LOW (ref 0.7–4.0)
MCH: 32.9 pg (ref 26.0–34.0)
MCHC: 33.9 g/dL (ref 30.0–36.0)
MCV: 97 fL (ref 80.0–100.0)
Monocytes Absolute: 0.5 10*3/uL (ref 0.1–1.0)
Monocytes Relative: 8 %
Neutro Abs: 4.9 10*3/uL (ref 1.7–7.7)
Neutrophils Relative %: 79 %
Platelets: 299 10*3/uL (ref 150–400)
RBC: 2.98 MIL/uL — ABNORMAL LOW (ref 3.87–5.11)
RDW: 13.5 % (ref 11.5–15.5)
WBC: 6.2 10*3/uL (ref 4.0–10.5)
nRBC: 0 % (ref 0.0–0.2)

## 2021-10-07 MED ORDER — HEPARIN SOD (PORK) LOCK FLUSH 100 UNIT/ML IV SOLN
500.0000 [IU] | Freq: Once | INTRAVENOUS | Status: AC | PRN
  Administered 2021-10-07: 500 [IU]
  Filled 2021-10-07: qty 5

## 2021-10-07 MED ORDER — SODIUM CHLORIDE 0.9 % IV SOLN
10.0000 mg/kg | Freq: Once | INTRAVENOUS | Status: AC
  Administered 2021-10-07: 620 mg via INTRAVENOUS
  Filled 2021-10-07: qty 2.4

## 2021-10-07 MED ORDER — SODIUM CHLORIDE 0.9 % IV SOLN
Freq: Once | INTRAVENOUS | Status: AC
  Filled 2021-10-07: qty 250

## 2021-10-07 MED ORDER — IRON-VITAMIN C 65-125 MG PO TABS: 1.0000 | ORAL_TABLET | Freq: Every day | ORAL | 3 refills | Status: DC

## 2021-10-07 NOTE — Assessment & Plan Note (Signed)
Treatment plan was listed above

## 2021-10-07 NOTE — Assessment & Plan Note (Signed)
Recommend patient to take Vitron C daily

## 2021-10-07 NOTE — Patient Instructions (Signed)
[facility] CANCER CTR AT [place]  Discharge Instructions: Thank you for choosing [facility] to provide your oncology and hematology care.  If you have a lab appointment with the [facility], please go directly to the [place] and check in at the registration area.  Wear comfortable clothing and clothing appropriate for easy access to any Portacath or PICC line.   We strive to give you quality time with your provider. You may need to reschedule your appointment if you arrive late (15 or more minutes).  Arriving late affects you and other patients whose appointments are after yours.  Also, if you miss three or more appointments without notifying the office, you may be dismissed from the clinic at the provider's discretion.      For prescription refill requests, have your pharmacy contact our office and allow 72 hours for refills to be completed.    Today you received the following chemotherapy and/or immunotherapy agents: Durvalumab       To help prevent nausea and vomiting after your treatment, we encourage you to take your nausea medication as directed.  BELOW ARE SYMPTOMS THAT SHOULD BE REPORTED IMMEDIATELY: *FEVER GREATER THAN 100.4 F (38 C) OR HIGHER *CHILLS OR SWEATING *NAUSEA AND VOMITING THAT IS NOT CONTROLLED WITH YOUR NAUSEA MEDICATION *UNUSUAL SHORTNESS OF BREATH *UNUSUAL BRUISING OR BLEEDING *URINARY PROBLEMS (pain or burning when urinating, or frequent urination) *BOWEL PROBLEMS (unusual diarrhea, constipation, pain near the anus) TENDERNESS IN MOUTH AND THROAT WITH OR WITHOUT PRESENCE OF ULCERS (sore throat, sores in mouth, or a toothache) UNUSUAL RASH, SWELLING OR PAIN  UNUSUAL VAGINAL DISCHARGE OR ITCHING   Items with * indicate a potential emergency and should be followed up as soon as possible or go to the Emergency Department if any problems should occur.  Please show the CHEMOTHERAPY ALERT CARD or IMMUNOTHERAPY ALERT CARD at check-in  to the Emergency Department and triage nurse.  Should you have questions after your visit or need to cancel or reschedule your appointment, please contact [facility] CANCER [place] AT [place]  [phone] and follow the prompts.  Office hours are 8:00 a.m. to 4:30 p.m. Monday - Friday. Please note that voicemails left after 4:00 p.m. may not be returned until the following business day.  We are closed weekends and major holidays. You have access to a nurse at all times for urgent questions. Please call the main number to the clinic [phone] and follow the prompts.  For any non-urgent questions, you may also contact your provider using MyChart. We now offer e-Visits for anyone 74 and older to request care online for non-urgent symptoms. For details visit [email or web address].   Also download the MyChart app! Go to the app store, search "MyChart", open the app, select [facility], and log in with your MyChart username and password.  Masks are optional in the cancer centers. If you would like for your care team to wear a mask while they are taking care of you, please let them know. For doctor visits, patients may have with them one support person who is at least 74 years old. At this time, visitors are not allowed in the infusion area.

## 2021-10-07 NOTE — Assessment & Plan Note (Signed)
Locally advanced cervical cancer stage IIIC1 with pelvic nodes involvement. S/p concurrent chemotherapy and radiation followed by brachytherapy.  She is not actively following up with gynecology oncology.

## 2021-10-07 NOTE — Assessment & Plan Note (Signed)
Monitor creatinine levels.  Avoid nephrotoxins. Encourage oral hydration.

## 2021-10-07 NOTE — Assessment & Plan Note (Signed)
Stage III right lung adenocarcinoma,  Recurrent S/p concurrent chemotherapy and radiation- On immunotherapy durvalumab maintenance Labs reviewed and discussed with patient.  Proceed with Durvalumab treatments.

## 2021-10-07 NOTE — Assessment & Plan Note (Signed)
Recommend smoke cessation.  

## 2021-10-07 NOTE — Progress Notes (Signed)
Hematology/Oncology Progress note [phone] [phone]      Patient Care Team: [name], DO as PCP - General (Family Medicine) Clent Jacks, RN as Oncology Nurse Navigator Noreene Filbert, MD as Radiation Oncologist (Radiation Oncology) Telford Nab, RN as Oncology Nurse Navigator [name], MD as Consulting Physician (Oncology)  ASSESSMENT & PLAN:   Cancer Staging  Cervical cancer [facility]) Staging form: Cervix Uteri, AJCC Version 9 - Clinical: FIGO Stage IIICr (cTX, cN1) - Unsigned  Primary lung adenocarcinoma ([place]) Staging form: Lung, AJCC 7th Edition - Clinical stage from 01/12/2021: T1, N1, M0 - Signed by [name], MD on 01/29/2021   Primary lung adenocarcinoma ([place]) Stage III right lung adenocarcinoma,  Recurrent S/p concurrent chemotherapy and radiation- On immunotherapy durvalumab maintenance Labs reviewed and discussed with patient.  Proceed with Durvalumab treatments.   Tobacco use Recommend smoke cessation.  Cervical cancer ([place]) Locally advanced cervical cancer stage IIIC1 with pelvic nodes involvement. S/p concurrent chemotherapy and radiation followed by brachytherapy.  She is not actively following up with gynecology oncology.    Encounter for antineoplastic immunotherapy Treatment plan was listed above  CKD (chronic kidney disease) stage 3, GFR 30-59 ml/min (HCC) Monitor creatinine levels.  Avoid nephrotoxins. Encourage oral hydration.   Anemia in chronic kidney disease (CODE) Recommend patient to take Vitron C daily   No orders of the defined types were placed in this encounter.   Lab md 2 weeks durvalumab All questions were answered. The patient knows to call the clinic with any problems, questions or concerns.  [name], MD, PhD [facility] Hematology Oncology 10/07/2021     CHIEF COMPLAINTS/REASON FOR VISIT:  Follow up lung cancer treatments  HISTORY OF PRESENTING ILLNESS:  # Cervix cancer Patient  has developed postmenopausal vaginal bleeding and discharge.  She was noted to have a friable cervix/2 cm mass of the cervix, concerning for malignancy 12/19/2018 endometrial biopsy showed scattered atypical squamous cells suspicious for malignancy.  Predominantly necrosis with associated inflammation. 01/01/2019 initial cervix biopsy showed at least high-grade squamous intraepithelial lesion.-HPV negative 01/22/2019, repeat vaginal wall biopsy and cervix 9:00 biopsy showed squamous cell carcinoma.    Staging images 12/31/2018 showed fluid distending the endometrial canal and or endometrial thickening to the level of cervix.  No evidence of pathological lymphadenopathy.  Haziness about the lower cervix.  7 mm irregular pulmonary nodule within the right upper lobe, suspicious for possible primary or metastatic malignancy.  Chronic pleural-parenchymal scarring/fibrosis at the lung apices.  Large amount of stool.  No bowel obstruction. 01/08/2019 PET scan showed marked hypermetabolism in the region of the cervix, compatible with the reported history of cervical cancer. Small bilateral pelvic sidewall lymph nodes discernible FDG accumulation, concerning for metastatic disease although neither lymph node is enlarged by CT size criteria. 7 mm nodule in the right upper lobe shows discernible FDG accumulation.  Questionable neoplasm, primary versus metastatic. Emphysema.   #Chronic hearing loss  # Lung nodule, FDG avid, questionable primary bronchogenic carcinoma versus metastatic cancer.Discussed with radiation oncology.  Her case was also discussed on tumor board.  Consensus reached on finishing concurrent chemoradiation treatments for cervix cancer first. Possible SBRT to lung nodule for presumed primary lung cancer.  # 02/20/2020 - 03/19/2020 concurrent chemotherapy weekly carboplatin and taxol and radiation for treatment of this cervix cancer.   #December 2021 lung nodule,was evaluated by Dr.Oaks. Options  of biopsy via transthoracic or endobronchial approach vs surgical resection.  Patient opted to empiric SBRT  Patient was last seen  by me on 06/25/2019.  Lost follow-up. She continues to follow with radiation oncology. 11/20/2020, CT chest with contrast showed minimal residual of previously seen right upper lobe nodule.  0.6 x 0.4 cm.  Internal development of mild bandlike radiation fibrosis.  Newly enlarged pretracheal lymph node, measuring 2.5 x 1.7 cm.  Highly concerning for metastatic disease.  Unchanged prominent AP window and the right hilar lymph nodes.  Emphysema and diffuse bilateral bronchial wall thickening.  Coronary artery disease. 12/13/2020, PET scan showed enlarging hypermetabolic right lower paratracheal lymph node, compatible with metastatic disease.  SUV 10.3.  Mild FDG uptake of the bilateral sacral alla with associated lucency.  Concerning for sacral insufficiency fracture.  Stable posttreatment changes of the right upper lobe nodule.  Nonspecific small solid 3 mm pulmonary nodule of the left lower lobe.  2 small to be characterized.  No evidence of FDG avid metastatic disease in the abdomen or pelvis.  Aortic atherosclerosis and emphysema.  Patient reestablish care on 12/27/2020 for lung cancer treatments   #01/12/2021 patient underwent bronchoscopy biopsy by Dr. Patsey Berthold Right paratracheal lymph node fine-needle aspiration is positive for metastatic non-small cell carcinoma, favor adenocarcinoma of lung origin.  Circulogen NGS negative PD-L1, ALK, ROS1, NTRK1/2/3,   # 02/14/2021-03/31/2021, concurrent carboplatin AUC 2 and taxol 31m/m2 with radiation.  04/26/2021, CT chest with contrast showed evolving postradiation changes in the posterior perifissural upper right lung without discrete residual measurable nodule in this location.  Right hilar/mediastinal lymphadenopathy stable to decreased.  Tiny 0.2 cm left lower lobe pulmonary nodule slightly decreased.  Positive response to  treatment.  No new or progressive disease.  Three-vessel coronary arthrosclerosis.  Aortic atherosclerosis/emphysema.  INTERVAL HISTORY Mary IDLERis a 76y.o. female who has above history reviewed by me today presents for follow up visit for management of possible recurrent lung cancer.  History of cervix cancer.    No new complaints. She tolerates immunotherapy well.   Denies any SOB, cough, chest pain,  rash.  Review of Systems  Constitutional:  Positive for fatigue. Negative for appetite change, chills, fever and unexpected weight change.  HENT:   Positive for hearing loss. Negative for voice change.   Eyes:  Negative for eye problems.  Respiratory:  Negative for chest tightness and cough.   Cardiovascular:  Negative for chest pain.  Gastrointestinal:  Negative for abdominal distention, abdominal pain and blood in stool.  Endocrine: Negative for hot flashes.  Genitourinary:  Negative for difficulty urinating and frequency.   Musculoskeletal:  Negative for arthralgias.  Skin:  Negative for itching and rash.  Neurological:  Negative for extremity weakness.  Hematological:  Negative for adenopathy.  Psychiatric/Behavioral:  Negative for confusion.     MEDICAL HISTORY:  Past Medical History:  Diagnosis Date   Arthritis    Cancer ([facility]    Carotid atherosclerosis    Carotid bruit    Cervical cancer (HCC)    COPD (chronic obstructive pulmonary disease) (HCC)    emphysema   Coronary artery disease    Family history of adverse reaction to anesthesia    paternal grandfather died during surgery-pt unaware what happened   GERD (gastroesophageal reflux disease)    Hyperlipidemia    Hypertension    Hyponatremia 02/27/2019   Hypothyroidism    Infected cat bite 1980s   was hospitalized   Irregular heartbeat    Medical history non-contributory    Peripheral vascular disease ([place]    Tobacco use     SURGICAL HISTORY: Past Surgical  History:  Procedure Laterality Date    CAROTID ENDARTERECTOMY Right Oct. 2015   Dr. Lucky Cowboy   CAROTID STENOSIS Left April 2016   carotid stenosis surgery   COLONOSCOPY WITH PROPOFOL N/A 11/08/2016   Procedure: COLONOSCOPY WITH PROPOFOL;  Surgeon: Jonathon Bellows, MD;  Location: [facility] ENDOSCOPY;  Service: Gastroenterology;  Laterality: N/A;   ESOPHAGOGASTRODUODENOSCOPY (EGD) WITH PROPOFOL N/A 11/08/2016   Procedure: ESOPHAGOGASTRODUODENOSCOPY (EGD) WITH PROPOFOL;  Surgeon: Jonathon Bellows, MD;  Location: [facility] ENDOSCOPY;  Service: Gastroenterology;  Laterality: N/A;   FLEXIBLE BRONCHOSCOPY N/A 12/17/2014   Procedure: FLEXIBLE BRONCHOSCOPY;  Surgeon: Wilhelmina Mcardle, MD;  Location: [facility] ORS;  Service: Pulmonary;  Laterality: N/A;   INCISION AND DRAINAGE / EXCISION THYROGLOSSAL CYST  April 2011   PORTA CATH INSERTION N/A 03/03/2021   Procedure: PORTA CATH INSERTION;  Surgeon: Algernon Huxley, MD;  Location: [facility];  Service: Cardiovascular;  Laterality: N/A;   VIDEO BRONCHOSCOPY WITH ENDOBRONCHIAL ULTRASOUND N/A 01/12/2021   Procedure: VIDEO BRONCHOSCOPY WITH ENDOBRONCHIAL ULTRASOUND;  Surgeon: Tyler Pita, MD;  Location: [facility] ORS;  Service: Cardiopulmonary;  Laterality: N/A;    SOCIAL HISTORY: Social History   Socioeconomic History   Marital status: Married    Spouse name: Deidre Ala   Number of children: Not on file   Years of education: Not on file   Highest education level: 9th grade  Occupational History   Occupation: retired   Tobacco Use   Smoking status: Every Day    Packs/day: 0.50    Years: 50.00    Total pack years: 25.00    Types: Cigarettes   Smokeless tobacco: Never   Tobacco comments:    0.5 PPD 02/10/2021  Vaping Use   Vaping Use: Former  Substance and Sexual Activity   Alcohol use: No    Alcohol/week: 0.0 standard drinks of alcohol   Drug use: No   Sexual activity: Yes  Other Topics Concern   Not on file  Social History Narrative   Lives at home with husband   Social Determinants of Health    Financial Resource Strain: Low Risk  (03/15/2021)   Overall Financial Resource Strain (CARDIA)    Difficulty of Paying Living Expenses: Not hard at all  Food Insecurity: No Food Insecurity (03/15/2021)   Hunger Vital Sign    Worried About Running Out of Food in the Last Year: Never true    [place] in the Last Year: Never true  Transportation Needs: No Transportation Needs (03/15/2021)   PRAPARE - Hydrologist (Medical): No    Lack of Transportation (Non-Medical): No  Physical Activity: Inactive (03/15/2021)   Exercise Vital Sign    Days of Exercise per Week: 0 days    Minutes of Exercise per Session: 0 min  Stress: No Stress Concern Present (03/15/2021)   [place]    Feeling of Stress : Not at all  Social Connections: Moderately Isolated (03/15/2021)   Social Connection and Isolation Panel [NHANES]    Frequency of Communication with Friends and Family: More than three times a week    Frequency of Social Gatherings with Friends and Family: More than three times a week    Attends Religious Services: Never    Marine scientist or Organizations: No    Attends Archivist Meetings: Never    Marital Status: Married  Human resources officer Violence: Not At Risk (03/15/2021)   Humiliation, Afraid, Rape, and  Kick questionnaire    Fear of Current or Ex-Partner: No    Emotionally Abused: No    Physically Abused: No    Sexually Abused: No    FAMILY HISTORY: Family History  Problem Relation Age of Onset   Congestive Heart Failure Mother    Heart disease Mother    Hypertension Mother    Hypertension Sister    Diabetes Sister    Heart disease Sister    Heart disease Maternal Uncle    Heart disease Maternal Grandmother    Stroke Maternal Grandfather    Cancer Brother        liver, lung   Heart disease Brother    Hypertension Brother    Heart attack Brother    COPD Neg Hx     Breast cancer Neg Hx     ALLERGIES:  is allergic to atorvastatin.  MEDICATIONS:  Current Outpatient Medications  Medication Sig Dispense Refill   acetaminophen (TYLENOL) 500 MG tablet Take 500 mg by mouth every 6 (six) hours as needed for moderate pain.     alendronate (FOSAMAX) 70 MG tablet Take 1 tablet (70 mg total) by mouth every 7 (seven) days. Take with a full glass of water on an empty stomach. 12 tablet 3   chlorhexidine (PERIDEX) 0.12 % solution Use as directed 5 mLs in the mouth or throat 2 (two) times daily. 120 mL 0   clopidogrel (PLAVIX) 75 MG tablet Take 1 tablet (75 mg total) by mouth daily. 90 tablet 3   cyclobenzaprine (FLEXERIL) 10 MG tablet TAKE 1 TABLET AT BEDTIME 90 tablet 1   ferrous sulfate 325 (65 FE) MG EC tablet Take 1 tablet (325 mg total) by mouth daily with breakfast. 90 tablet 4   Fluticasone-Umeclidin-Vilant (TRELEGY ELLIPTA) 100-62.5-25 MCG/INH AEPB Inhale 1 puff into the lungs daily. 1 each 11   gabapentin (NEURONTIN) 100 MG capsule TAKE 1 CAPSULE THREE TIMES DAILY 270 capsule 0   levothyroxine (SYNTHROID) 88 MCG tablet Take 1 tablet (88 mcg total) by mouth daily. 90 tablet 3   lidocaine (LIDODERM) 5 % Place 1 patch onto the skin daily. Remove & Discard patch within 12 hours or as directed by MD 90 patch 1   lidocaine-prilocaine (EMLA) cream Apply 1 application topically as needed. 60 g 3   lisinopril (ZESTRIL) 20 MG tablet TAKE 1 TABLET EVERY DAY 90 tablet 0   Multiple Vitamin (MULTIVITAMIN) tablet Take 1 tablet by mouth daily.     omeprazole (PRILOSEC) 20 MG capsule TAKE 1 CAPSULE EVERY DAY 90 capsule 2   ondansetron (ZOFRAN) 8 MG tablet Take 1 tablet (8 mg total) by mouth 2 (two) times daily as needed for refractory nausea / vomiting. Start on day 3 after chemo. 30 tablet 1   traMADol (ULTRAM) 50 MG tablet Take 1-2 tablets (50-100 mg total) by mouth every 12 (twelve) hours as needed. 60 tablet 0   vitamin B-12 (CYANOCOBALAMIN) 500 MCG tablet Take 500 mcg by  mouth daily.     No current facility-administered medications for this visit.     PHYSICAL EXAMINATION: ECOG PERFORMANCE STATUS: 1 - Symptomatic but completely ambulatory There were no vitals filed for this visit.  There were no vitals filed for this visit.   Physical Exam Constitutional:      General: She is not in acute distress. HENT:     Head: Normocephalic and atraumatic.  Eyes:     General: No scleral icterus.    Pupils: Pupils are equal, round, and reactive  to light.  Cardiovascular:     Rate and Rhythm: Normal rate.  Pulmonary:     Effort: Pulmonary effort is normal. No respiratory distress.     Breath sounds: No wheezing.     Comments: Decreased breath sound bilaterally  Abdominal:     General: Bowel sounds are normal. There is no distension.     Palpations: Abdomen is soft.  Musculoskeletal:        General: No deformity. Normal range of motion.     Cervical back: Normal range of motion and neck supple.     Comments: Trace edema bilaterally  Skin:    General: Skin is warm and dry.     Findings: No erythema or rash.  Neurological:     Mental Status: She is alert and oriented to person, place, and time. Mental status is at baseline.     Cranial Nerves: No cranial nerve deficit.     Coordination: Coordination normal.  Psychiatric:        Mood and Affect: Mood normal.     LABORATORY DATA:  I have reviewed the data as listed    Latest Ref Rng & Units 09/23/2021    8:48 AM 09/09/2021    9:57 AM 08/26/2021    9:07 AM  CBC  WBC 4.0 - 10.5 K/uL 9.6  7.5  6.7   Hemoglobin 12.0 - 15.0 g/dL 10.3  10.4  10.2   Hematocrit 36.0 - 46.0 % 29.2  29.9  30.0   Platelets 150 - 400 K/uL 278  274  266       Latest Ref Rng & Units 09/23/2021    8:48 AM 09/09/2021    9:57 AM 08/26/2021    9:07 AM  CMP  Glucose 70 - 99 mg/dL 107  108  97   BUN 8 - 23 mg/dL _0 Creatinine 0.44 - 1.00 mg/dL 1.13  1.12  1.39   Sodium 135 - 145 mmol/L 130  130  136   Potassium 3.5 -  5.1 mmol/L 4.1  4.6  4.5   Chloride 98 - 111 mmol/L 101  95  106   CO2 22 - 32 mmol/L _1 Calcium 8.9 - 10.3 mg/dL 8.5  9.1  8.9   Total Protein 6.5 - 8.1 g/dL 5.8  6.1  6.4   Total Bilirubin 0.3 - 1.2 mg/dL 0.6  0.3  0.9   Alkaline Phos 38 - 126 U/L 56  56  59   AST 15 - 41 U/L _2 ALT 0 - 44 U/L _3 Iron/TIBC/Ferritin/ %Sat    Component Value Date/Time   IRON 90 05/04/2021 0929   TIBC 321 05/04/2021 0929   FERRITIN 52 05/04/2021 0929   IRONPCTSAT 28 05/04/2021 0929      RADIOGRAPHIC STUDIES: I have personally reviewed the radiological images as listed and agreed with the findings in the report. CT Chest W Contrast  Result Date: 07/25/2021 CLINICAL DATA:  Restaging lung cancer. No current complaints. * Tracking Code: BO * EXAM: CT CHEST WITH CONTRAST TECHNIQUE: Multidetector CT imaging of the chest was performed during intravenous contrast administration. RADIATION DOSE REDUCTION: This exam was performed according to the departmental dose-optimization program which includes automated exposure control, adjustment of the mA and/or kV according to patient size and/or use of iterative reconstruction technique. CONTRAST:  47m OMNIPAQUE IOHEXOL 300 MG/ML  SOLN COMPARISON:  Chest CT 04/25/2021 and 11/18/2020.  PET-CT 12/13/2020. FINDINGS: Cardiovascular: Left IJ Port-A-Cath extends to the upper right atrium. There is atherosclerosis of the aorta, great vessels and coronary arteries. No acute vascular findings. The heart size is normal. There is no pericardial effusion. Mediastinum/Nodes: Stable 9 mm right hilar node on image 58/2. No recurrent mediastinal, hilar or axillary adenopathy. The thyroid gland, trachea and esophagus demonstrate no significant findings. Lungs/Pleura: No pleural effusion or pneumothorax. Severe centrilobular paraseptal emphysema again noted with chronic radiation changes posteriorly in the right upper lobe and superior segment of the right  lower lobe. 3 mm left lower lobe pulmonary nodule on image 96/3 appears slightly more prominent, although grossly unchanged from 11/18/2020. This measures up to 5 mm on sagittal image 131/6. Stable calcified right lower lobe granuloma. No other enlarging or new nodules identified. Upper abdomen: The visualized upper abdomen appears stable without significant findings. Musculoskeletal/Chest wall: There is no chest wall mass or suspicious osseous finding. Mild spondylosis. IMPRESSION: 1. Questionable slight enlargement a small left lower lobe nodule measuring up to 5 mm of the sagittal images compared with the most recent prior study. Attention on follow-up in approximately 6 months recommended. 2. Otherwise stable chest CT with stable radiation changes superiorly in the right hemithorax and small residual right hilar lymph node. 3. Coronary and aortic atherosclerosis (ICD10-I70.0). Emphysema (ICD10-J43.9). Electronically Signed   By: Richardean Sale M.D.   On: 07/25/2021 16:00

## 2021-10-17 ENCOUNTER — Other Ambulatory Visit: Payer: Self-pay | Admitting: *Deleted

## 2021-10-21 ENCOUNTER — Encounter: Payer: Self-pay | Admitting: Oncology

## 2021-10-21 ENCOUNTER — Inpatient Hospital Stay: Payer: Medicare PPO

## 2021-10-21 ENCOUNTER — Inpatient Hospital Stay (HOSPITAL_BASED_OUTPATIENT_CLINIC_OR_DEPARTMENT_OTHER): Payer: Medicare PPO | Admitting: Oncology

## 2021-10-21 DIAGNOSIS — Z720 Tobacco use: Secondary | ICD-10-CM

## 2021-10-21 DIAGNOSIS — Z79899 Other long term (current) drug therapy: Secondary | ICD-10-CM | POA: Diagnosis not present

## 2021-10-21 DIAGNOSIS — N1831 Chronic kidney disease, stage 3a: Secondary | ICD-10-CM

## 2021-10-21 DIAGNOSIS — G893 Neoplasm related pain (acute) (chronic): Secondary | ICD-10-CM | POA: Diagnosis not present

## 2021-10-21 DIAGNOSIS — C3491 Malignant neoplasm of unspecified part of right bronchus or lung: Secondary | ICD-10-CM

## 2021-10-21 DIAGNOSIS — I129 Hypertensive chronic kidney disease with stage 1 through stage 4 chronic kidney disease, or unspecified chronic kidney disease: Secondary | ICD-10-CM | POA: Diagnosis not present

## 2021-10-21 DIAGNOSIS — G8929 Other chronic pain: Secondary | ICD-10-CM | POA: Diagnosis not present

## 2021-10-21 DIAGNOSIS — M549 Dorsalgia, unspecified: Secondary | ICD-10-CM | POA: Diagnosis not present

## 2021-10-21 DIAGNOSIS — N183 Chronic kidney disease, stage 3 unspecified: Secondary | ICD-10-CM | POA: Diagnosis not present

## 2021-10-21 DIAGNOSIS — F17210 Nicotine dependence, cigarettes, uncomplicated: Secondary | ICD-10-CM | POA: Diagnosis not present

## 2021-10-21 DIAGNOSIS — M545 Low back pain, unspecified: Secondary | ICD-10-CM

## 2021-10-21 DIAGNOSIS — C539 Malignant neoplasm of cervix uteri, unspecified: Secondary | ICD-10-CM | POA: Diagnosis not present

## 2021-10-21 DIAGNOSIS — Z5112 Encounter for antineoplastic immunotherapy: Secondary | ICD-10-CM | POA: Diagnosis not present

## 2021-10-21 LAB — CBC WITH DIFFERENTIAL/PLATELET
Abs Immature Granulocytes: 0.04 10*3/uL (ref 0.00–0.07)
Basophils Absolute: 0 10*3/uL (ref 0.0–0.1)
Basophils Relative: 1 %
Eosinophils Absolute: 0.2 10*3/uL (ref 0.0–0.5)
Eosinophils Relative: 4 %
HCT: 30.3 % — ABNORMAL LOW (ref 36.0–46.0)
Hemoglobin: 10.5 g/dL — ABNORMAL LOW (ref 12.0–15.0)
Immature Granulocytes: 1 %
Lymphocytes Relative: 8 %
Lymphs Abs: 0.6 10*3/uL — ABNORMAL LOW (ref 0.7–4.0)
MCH: 33 pg (ref 26.0–34.0)
MCHC: 34.7 g/dL (ref 30.0–36.0)
MCV: 95.3 fL (ref 80.0–100.0)
Monocytes Absolute: 0.6 10*3/uL (ref 0.1–1.0)
Monocytes Relative: 9 %
Neutro Abs: 5.2 10*3/uL (ref 1.7–7.7)
Neutrophils Relative %: 77 %
Platelets: 273 10*3/uL (ref 150–400)
RBC: 3.18 MIL/uL — ABNORMAL LOW (ref 3.87–5.11)
RDW: 13.2 % (ref 11.5–15.5)
WBC: 6.7 10*3/uL (ref 4.0–10.5)
nRBC: 0 % (ref 0.0–0.2)

## 2021-10-21 LAB — COMPREHENSIVE METABOLIC PANEL
ALT: 12 U/L (ref 0–44)
AST: 22 U/L (ref 15–41)
Albumin: 3.4 g/dL — ABNORMAL LOW (ref 3.5–5.0)
Alkaline Phosphatase: 68 U/L (ref 38–126)
Anion gap: 6 (ref 5–15)
BUN: 14 mg/dL (ref 8–23)
CO2: 26 mmol/L (ref 22–32)
Calcium: 9.1 mg/dL (ref 8.9–10.3)
Chloride: 100 mmol/L (ref 98–111)
Creatinine, Ser: 1.11 mg/dL — ABNORMAL HIGH (ref 0.44–1.00)
GFR, Estimated: 52 mL/min — ABNORMAL LOW (ref >60)
Glucose, Bld: 106 mg/dL — ABNORMAL HIGH (ref 70–99)
Potassium: 4.1 mmol/L (ref 3.5–5.1)
Sodium: 132 mmol/L — ABNORMAL LOW (ref 135–145)
Total Bilirubin: 0.4 mg/dL (ref 0.3–1.2)
Total Protein: 6.6 g/dL (ref 6.5–8.1)

## 2021-10-21 MED ORDER — HEPARIN SOD (PORK) LOCK FLUSH 100 UNIT/ML IV SOLN
500.0000 [IU] | Freq: Once | INTRAVENOUS | Status: AC | PRN
  Administered 2021-10-21: 500 [IU]
  Filled 2021-10-21: qty 5

## 2021-10-21 MED ORDER — SODIUM CHLORIDE 0.9 % IV SOLN
10.0000 mg/kg | Freq: Once | INTRAVENOUS | Status: AC
  Administered 2021-10-21: 620 mg via INTRAVENOUS
  Filled 2021-10-21: qty 2.4

## 2021-10-21 MED ORDER — SODIUM CHLORIDE 0.9 % IV SOLN
Freq: Once | INTRAVENOUS | Status: AC
  Filled 2021-10-21: qty 250

## 2021-10-21 MED ORDER — TRAMADOL HCL 50 MG PO TABS: 50.0000 mg | ORAL_TABLET | Freq: Two times a day (BID) | ORAL | 0 refills | Status: DC | PRN

## 2021-10-21 NOTE — Assessment & Plan Note (Signed)
Treatment plan was listed above

## 2021-10-21 NOTE — Progress Notes (Signed)
Hematology/Oncology Progress note [phone] [phone]      Patient Care Team: [name], DO as PCP - General (Family Medicine) Clent Jacks, RN as Oncology Nurse Navigator Noreene Filbert, MD as Radiation Oncologist (Radiation Oncology) Telford Nab, RN as Oncology Nurse Navigator [name], MD as Consulting Physician (Oncology)  ASSESSMENT & PLAN:   Cancer Staging  Cervical cancer [facility] [address]) Staging form: Cervix Uteri, AJCC Version 9 - Clinical: FIGO Stage IIICr (cTX, cN1) - Unsigned  Primary lung adenocarcinoma ([place]) Staging form: Lung, AJCC 7th Edition - Clinical stage from 01/12/2021: T1, N1, M0 - Signed by [name], MD on 01/29/2021   Primary lung adenocarcinoma ([place]) Stage III right lung adenocarcinoma,  Recurrent S/p concurrent chemotherapy and radiation- On immunotherapy durvalumab maintenance Labs reviewed and discussed with patient.  Proceed with Durvalumab treatments.   Tobacco use Recommend smoke cessation.  Encounter for antineoplastic immunotherapy Treatment plan was listed above  CKD (chronic kidney disease) stage 3, GFR 30-59 ml/min (HCC) Monitor creatinine levels.  Avoid nephrotoxins. Encourage oral hydration.   Back pain Continue tramadol every 6 hours as needed.  Refills sent.   No orders of the defined types were placed in this encounter.   Lab md 2 weeks durvalumab All questions were answered. The patient knows to call the clinic with any problems, questions or concerns.  [name], MD, PhD [facility] Hematology Oncology 10/21/2021     CHIEF COMPLAINTS/REASON FOR VISIT:  Follow up lung cancer treatments  HISTORY OF PRESENTING ILLNESS:  # Cervix cancer Patient has developed postmenopausal vaginal bleeding and discharge.  She was noted to have a friable cervix/2 cm mass of the cervix, concerning for malignancy 12/19/2018 endometrial biopsy showed scattered atypical squamous cells suspicious for  malignancy.  Predominantly necrosis with associated inflammation. 01/01/2019 initial cervix biopsy showed at least high-grade squamous intraepithelial lesion.-HPV negative 01/22/2019, repeat vaginal wall biopsy and cervix 9:00 biopsy showed squamous cell carcinoma.    Staging images 12/31/2018 showed fluid distending the endometrial canal and or endometrial thickening to the level of cervix.  No evidence of pathological lymphadenopathy.  Haziness about the lower cervix.  7 mm irregular pulmonary nodule within the right upper lobe, suspicious for possible primary or metastatic malignancy.  Chronic pleural-parenchymal scarring/fibrosis at the lung apices.  Large amount of stool.  No bowel obstruction. 01/08/2019 PET scan showed marked hypermetabolism in the region of the cervix, compatible with the reported history of cervical cancer. Small bilateral pelvic sidewall lymph nodes discernible FDG accumulation, concerning for metastatic disease although neither lymph node is enlarged by CT size criteria. 7 mm nodule in the right upper lobe shows discernible FDG accumulation.  Questionable neoplasm, primary versus metastatic. Emphysema.   #Chronic hearing loss  # Lung nodule, FDG avid, questionable primary bronchogenic carcinoma versus metastatic cancer.Discussed with radiation oncology.  Her case was also discussed on tumor board.  Consensus reached on finishing concurrent chemoradiation treatments for cervix cancer first. Possible SBRT to lung nodule for presumed primary lung cancer.  # 02/20/2020 - 03/19/2020 concurrent chemotherapy weekly carboplatin and taxol and radiation for treatment of this cervix cancer.   #December 2021 lung nodule,was evaluated by Dr.Oaks. Options of biopsy via transthoracic or endobronchial approach vs surgical resection.  Patient opted to empiric SBRT  Patient was last seen by me on 06/25/2019.  Lost follow-up. She continues to follow with radiation oncology. 11/20/2020, CT  chest with contrast showed minimal residual of previously seen right upper lobe nodule.  0.6 x 0.4 cm.  Internal development of mild bandlike radiation fibrosis.  Newly enlarged pretracheal lymph node, measuring 2.5 x 1.7 cm.  Highly concerning for metastatic disease.  Unchanged prominent AP window and the right hilar lymph nodes.  Emphysema and diffuse bilateral bronchial wall thickening.  Coronary artery disease. 12/13/2020, PET scan showed enlarging hypermetabolic right lower paratracheal lymph node, compatible with metastatic disease.  SUV 10.3.  Mild FDG uptake of the bilateral sacral alla with associated lucency.  Concerning for sacral insufficiency fracture.  Stable posttreatment changes of the right upper lobe nodule.  Nonspecific small solid 3 mm pulmonary nodule of the left lower lobe.  2 small to be characterized.  No evidence of FDG avid metastatic disease in the abdomen or pelvis.  Aortic atherosclerosis and emphysema.  Patient reestablish care on 12/27/2020 for lung cancer treatments   #01/12/2021 patient underwent bronchoscopy biopsy by Dr. Patsey Berthold Right paratracheal lymph node fine-needle aspiration is positive for metastatic non-small cell carcinoma, favor adenocarcinoma of lung origin.  Circulogen NGS negative PD-L1, ALK, ROS1, NTRK1/2/3,   # 02/14/2021-03/31/2021, concurrent carboplatin AUC 2 and taxol 78m/m2 with radiation.  04/26/2021, CT chest with contrast showed evolving postradiation changes in the posterior perifissural upper right lung without discrete residual measurable nodule in this location.  Right hilar/mediastinal lymphadenopathy stable to decreased.  Tiny 0.2 cm left lower lobe pulmonary nodule slightly decreased.  Positive response to treatment.  No new or progressive disease.  Three-vessel coronary arthrosclerosis.  Aortic atherosclerosis/emphysema.  INTERVAL HISTORY Mary INOCENCIOis a 76y.o. female who has above history reviewed by me today presents for follow  up visit for management of possible recurrent lung cancer.  History of cervix cancer.    No new complaints. She tolerates immunotherapy well.  Chronic back pain, tramadol is helping her pain.  She requests refill. Denies any SOB, cough, chest pain,  rash.  Review of Systems  Constitutional:  Positive for fatigue. Negative for appetite change, chills, fever and unexpected weight change.  HENT:   Positive for hearing loss. Negative for voice change.   Eyes:  Negative for eye problems.  Respiratory:  Negative for chest tightness and cough.   Cardiovascular:  Negative for chest pain.  Gastrointestinal:  Negative for abdominal distention, abdominal pain and blood in stool.  Endocrine: Negative for hot flashes.  Genitourinary:  Negative for difficulty urinating and frequency.   Musculoskeletal:  Negative for arthralgias.  Skin:  Negative for itching and rash.  Neurological:  Negative for extremity weakness.  Hematological:  Negative for adenopathy.  Psychiatric/Behavioral:  Negative for confusion.     MEDICAL HISTORY:  Past Medical History:  Diagnosis Date   Arthritis    [place]    Carotid atherosclerosis    Carotid bruit    Cervical cancer (HCC)    COPD (chronic obstructive pulmonary disease) (HCC)    emphysema   Coronary artery disease    Family history of adverse reaction to anesthesia    paternal grandfather died during surgery-pt unaware what happened   GERD (gastroesophageal reflux disease)    Hyperlipidemia    Hypertension    Hyponatremia 02/27/2019   Hypothyroidism    Infected cat bite 1980s   was hospitalized   Irregular heartbeat    Medical history non-contributory    Peripheral vascular disease ([place]    Tobacco use     SURGICAL HISTORY: Past Surgical History:  Procedure Laterality Date   CAROTID ENDARTERECTOMY Right Oct. 2015   Dr. DLucky Cowboy  CAROTID STENOSIS Left April 2016  carotid stenosis surgery   COLONOSCOPY WITH PROPOFOL N/A 11/08/2016   Procedure:  COLONOSCOPY WITH PROPOFOL;  Surgeon: Jonathon Bellows, MD;  [facility]: [facility] ENDOSCOPY;  Service: Gastroenterology;  Laterality: N/A;   ESOPHAGOGASTRODUODENOSCOPY (EGD) WITH PROPOFOL N/A 11/08/2016   Procedure: ESOPHAGOGASTRODUODENOSCOPY (EGD) WITH PROPOFOL;  Surgeon: Jonathon Bellows, MD;  Location: [facility] ENDOSCOPY;  Service: Gastroenterology;  Laterality: N/A;   FLEXIBLE BRONCHOSCOPY N/A 12/17/2014   Procedure: FLEXIBLE BRONCHOSCOPY;  Surgeon: Wilhelmina Mcardle, MD;  Location: [facility] ORS;  Service: Pulmonary;  Laterality: N/A;   INCISION AND DRAINAGE / EXCISION THYROGLOSSAL CYST  April 2011   PORTA CATH INSERTION N/A 03/03/2021   Procedure: PORTA CATH INSERTION;  Surgeon: Algernon Huxley, MD;  Location: [facility];  Service: Cardiovascular;  Laterality: N/A;   VIDEO BRONCHOSCOPY WITH ENDOBRONCHIAL ULTRASOUND N/A 01/12/2021   Procedure: VIDEO BRONCHOSCOPY WITH ENDOBRONCHIAL ULTRASOUND;  Surgeon: Tyler Pita, MD;  Location: [facility] ORS;  Service: Cardiopulmonary;  Laterality: N/A;    SOCIAL HISTORY: Social History   Socioeconomic History   Marital status: Married    Spouse name: Deidre Ala   Number of children: Not on file   Years of education: Not on file   Highest education level: 9th grade  Occupational History   Occupation: retired   Tobacco Use   Smoking status: Every Day    Packs/day: 0.50    Years: 50.00    Total pack years: 25.00    Types: Cigarettes   Smokeless tobacco: Never   Tobacco comments:    0.5 PPD 02/10/2021  Vaping Use   Vaping Use: Former  Substance and Sexual Activity   Alcohol use: No    Alcohol/week: 0.0 standard drinks of alcohol   Drug use: No   Sexual activity: Yes  Other Topics Concern   Not on file  Social History Narrative   Lives at home with husband   Social Determinants of Health   Financial Resource Strain: Low Risk  (03/15/2021)   Overall Financial Resource Strain (CARDIA)    Difficulty of Paying Living Expenses: Not hard at all  Food Insecurity: No  Food Insecurity (03/15/2021)   Hunger Vital Sign    Worried About Running Out of Food in the Last Year: Never true    [place] in the Last Year: Never true  Transportation Needs: No Transportation Needs (03/15/2021)   PRAPARE - Hydrologist (Medical): No    Lack of Transportation (Non-Medical): No  Physical Activity: Inactive (03/15/2021)   Exercise Vital Sign    Days of Exercise per Week: 0 days    Minutes of Exercise per Session: 0 min  Stress: No Stress Concern Present (03/15/2021)   [place]    Feeling of Stress : Not at all  Social Connections: Moderately Isolated (03/15/2021)   Social Connection and Isolation Panel [NHANES]    Frequency of Communication with Friends and Family: More than three times a week    Frequency of Social Gatherings with Friends and Family: More than three times a week    Attends Religious Services: Never    Marine scientist or Organizations: No    Attends Archivist Meetings: Never    Marital Status: Married  Human resources officer Violence: Not At Risk (03/15/2021)   Humiliation, Afraid, Rape, and Kick questionnaire    Fear of Current or Ex-Partner: No    Emotionally Abused: No    Physically Abused: No  Sexually Abused: No    FAMILY HISTORY: Family History  Problem Relation Age of Onset   Congestive Heart Failure Mother    Heart disease Mother    Hypertension Mother    Hypertension Sister    Diabetes Sister    Heart disease Sister    Heart disease Maternal Uncle    Heart disease Maternal Grandmother    Stroke Maternal Grandfather    Cancer Brother        liver, lung   Heart disease Brother    Hypertension Brother    Heart attack Brother    COPD Neg Hx    Breast cancer Neg Hx     ALLERGIES:  is allergic to atorvastatin.  MEDICATIONS:  Current Outpatient Medications  Medication Sig Dispense Refill   acetaminophen (TYLENOL)  500 MG tablet Take 500 mg by mouth every 6 (six) hours as needed for moderate pain.     alendronate (FOSAMAX) 70 MG tablet Take 1 tablet (70 mg total) by mouth every 7 (seven) days. Take with a full glass of water on an empty stomach. 12 tablet 3   chlorhexidine (PERIDEX) 0.12 % solution Use as directed 5 mLs in the mouth or throat 2 (two) times daily. 120 mL 0   clopidogrel (PLAVIX) 75 MG tablet Take 1 tablet (75 mg total) by mouth daily. 90 tablet 3   cyclobenzaprine (FLEXERIL) 10 MG tablet TAKE 1 TABLET AT BEDTIME 90 tablet 1   Fluticasone-Umeclidin-Vilant (TRELEGY ELLIPTA) 100-62.5-25 MCG/INH AEPB Inhale 1 puff into the lungs daily. 1 each 11   gabapentin (NEURONTIN) 100 MG capsule TAKE 1 CAPSULE THREE TIMES DAILY 270 capsule 0   Iron-Vitamin C 65-125 MG TABS Take 1 tablet by mouth daily. 30 tablet 3   levothyroxine (SYNTHROID) 88 MCG tablet Take 1 tablet (88 mcg total) by mouth daily. 90 tablet 3   lidocaine (LIDODERM) 5 % Place 1 patch onto the skin daily. Remove & Discard patch within 12 hours or as directed by MD 90 patch 1   lidocaine-prilocaine (EMLA) cream Apply 1 application topically as needed. 60 g 3   lisinopril (ZESTRIL) 20 MG tablet TAKE 1 TABLET EVERY DAY 90 tablet 0   Multiple Vitamin (MULTIVITAMIN) tablet Take 1 tablet by mouth daily.     omeprazole (PRILOSEC) 20 MG capsule TAKE 1 CAPSULE EVERY DAY 90 capsule 2   ondansetron (ZOFRAN) 8 MG tablet Take 1 tablet (8 mg total) by mouth 2 (two) times daily as needed for refractory nausea / vomiting. Start on day 3 after chemo. 30 tablet 1   vitamin B-12 (CYANOCOBALAMIN) 500 MCG tablet Take 500 mcg by mouth daily.     traMADol (ULTRAM) 50 MG tablet Take 1-2 tablets (50-100 mg total) by mouth every 12 (twelve) hours as needed. 60 tablet 0   No current facility-administered medications for this visit.     PHYSICAL EXAMINATION: ECOG PERFORMANCE STATUS: 1 - Symptomatic but completely ambulatory Vitals:   10/21/21 0826  BP: 134/71   Pulse: 90  Resp: 18  Temp: 98.5 F (36.9 C)  SpO2: 100%    Filed Weights   10/21/21 0826  Weight: 135 lb 6.4 oz (61.4 kg)     Physical Exam Constitutional:      General: She is not in acute distress. HENT:     Head: Normocephalic and atraumatic.  Eyes:     General: No scleral icterus.    Pupils: Pupils are equal, round, and reactive to light.  Cardiovascular:     Rate  and Rhythm: Normal rate.  Pulmonary:     Effort: Pulmonary effort is normal. No respiratory distress.     Breath sounds: No wheezing.     Comments: Decreased breath sound bilaterally  Abdominal:     General: Bowel sounds are normal. There is no distension.     Palpations: Abdomen is soft.  Musculoskeletal:        General: No deformity. Normal range of motion.     Cervical back: Normal range of motion and neck supple.     Comments: Trace edema bilaterally  Skin:    General: Skin is warm and dry.     Findings: No erythema or rash.  Neurological:     Mental Status: She is alert and oriented to person, place, and time. Mental status is at baseline.     Cranial Nerves: No cranial nerve deficit.     Coordination: Coordination normal.  Psychiatric:        Mood and Affect: Mood normal.     LABORATORY DATA:  I have reviewed the data as listed    Latest Ref Rng & Units 10/21/2021    8:11 AM 10/07/2021    8:10 AM 09/23/2021    8:48 AM  CBC  WBC 4.0 - 10.5 K/uL 6.7  6.2  9.6   Hemoglobin 12.0 - 15.0 g/dL 10.5  9.8  10.3   Hematocrit 36.0 - 46.0 % 30.3  28.9  29.2   Platelets 150 - 400 K/uL 273  299  278       Latest Ref Rng & Units 10/21/2021    8:11 AM 10/07/2021    8:10 AM 09/23/2021    8:48 AM  CMP  Glucose 70 - 99 mg/dL 106  111  107   BUN 8 - 23 mg/dL _0 Creatinine 0.44 - 1.00 mg/dL 1.11  1.05  1.13   Sodium 135 - 145 mmol/L 132  133  130   Potassium 3.5 - 5.1 mmol/L 4.1  4.2  4.1   Chloride 98 - 111 mmol/L 100  102  101   CO2 22 - 32 mmol/L _1 Calcium 8.9 - 10.3 mg/dL 9.1   8.6  8.5   Total Protein 6.5 - 8.1 g/dL 6.6  6.1  5.8   Total Bilirubin 0.3 - 1.2 mg/dL 0.4  0.3  0.6   Alkaline Phos 38 - 126 U/L 68  62  56   AST 15 - 41 U/L _2 ALT 0 - 44 U/L _3 Iron/TIBC/Ferritin/ %Sat    Component Value Date/Time   IRON 90 05/04/2021 0929   TIBC 321 05/04/2021 0929   FERRITIN 52 05/04/2021 0929   IRONPCTSAT 28 05/04/2021 0929      RADIOGRAPHIC STUDIES: I have personally reviewed the radiological images as listed and agreed with the findings in the report. CT Chest W Contrast  Result Date: 07/25/2021 CLINICAL DATA:  Restaging lung cancer. No current complaints. * Tracking Code: BO * EXAM: CT CHEST WITH CONTRAST TECHNIQUE: Multidetector CT imaging of the chest was performed during intravenous contrast administration. RADIATION DOSE REDUCTION: This exam was performed according to the departmental dose-optimization program which includes automated exposure control, adjustment of the mA and/or kV according to patient size and/or use of iterative reconstruction technique. CONTRAST:  40m OMNIPAQUE IOHEXOL 300 MG/ML  SOLN COMPARISON:  Chest CT 04/25/2021 and 11/18/2020.  PET-CT 12/13/2020. FINDINGS: Cardiovascular: Left IJ Port-A-Cath extends to the upper right atrium. There is atherosclerosis of the aorta, great vessels and coronary arteries. No acute vascular findings. The heart size is normal. There is no pericardial effusion. Mediastinum/Nodes: Stable 9 mm right hilar node on image 58/2. No recurrent mediastinal, hilar or axillary adenopathy. The thyroid gland, trachea and esophagus demonstrate no significant findings. Lungs/Pleura: No pleural effusion or pneumothorax. Severe centrilobular paraseptal emphysema again noted with chronic radiation changes posteriorly in the right upper lobe and superior segment of the right lower lobe. 3 mm left lower lobe pulmonary nodule on image 96/3 appears slightly more prominent, although grossly unchanged from  11/18/2020. This measures up to 5 mm on sagittal image 131/6. Stable calcified right lower lobe granuloma. No other enlarging or new nodules identified. Upper abdomen: The visualized upper abdomen appears stable without significant findings. Musculoskeletal/Chest wall: There is no chest wall mass or suspicious osseous finding. Mild spondylosis. IMPRESSION: 1. Questionable slight enlargement a small left lower lobe nodule measuring up to 5 mm of the sagittal images compared with the most recent prior study. Attention on follow-up in approximately 6 months recommended. 2. Otherwise stable chest CT with stable radiation changes superiorly in the right hemithorax and small residual right hilar lymph node. 3. Coronary and aortic atherosclerosis (ICD10-I70.0). Emphysema (ICD10-J43.9). Electronically Signed   By: Richardean Sale M.D.   On: 07/25/2021 16:00

## 2021-10-21 NOTE — Assessment & Plan Note (Signed)
Recommend smoke cessation.  

## 2021-10-21 NOTE — Progress Notes (Signed)
Pt here for follow up. Reports that tramadol is helping with pain but she needs a refill because she has been using 2 tabs

## 2021-10-21 NOTE — Assessment & Plan Note (Signed)
Continue tramadol every 6 hours as needed.  Refills sent.

## 2021-10-21 NOTE — Assessment & Plan Note (Signed)
Stage III right lung adenocarcinoma,  Recurrent S/p concurrent chemotherapy and radiation- On immunotherapy durvalumab maintenance Labs reviewed and discussed with patient.  Proceed with Durvalumab treatments.

## 2021-10-21 NOTE — Assessment & Plan Note (Signed)
Monitor creatinine levels.  Avoid nephrotoxins. Encourage oral hydration.

## 2021-10-21 NOTE — Patient Instructions (Signed)
[facility]CER CTR AT [place]  Discharge Instructions: Thank you for choosing [facility] to provide your oncology and hematology care.  If you have a lab appointment with the [facility], please go directly to the [facility] and check in at the registration area.  Wear comfortable clothing and clothing appropriate for easy access to any Portacath or PICC line.   We strive to give you quality time with your provider. You may need to reschedule your appointment if you arrive late (15 or more minutes).  Arriving late affects you and other patients whose appointments are after yours.  Also, if you miss three or more appointments without notifying the office, you may be dismissed from the clinic at the provider's discretion.      For prescription refill requests, have your pharmacy contact our office and allow 72 hours for refills to be completed.    Today you received the following chemotherapy and/or immunotherapy agents Imfinzi      To help prevent nausea and vomiting after your treatment, we encourage you to take your nausea medication as directed.  BELOW ARE SYMPTOMS THAT SHOULD BE REPORTED IMMEDIATELY: *FEVER GREATER THAN 100.4 F (38 C) OR HIGHER *CHILLS OR SWEATING *NAUSEA AND VOMITING THAT IS NOT CONTROLLED WITH YOUR NAUSEA MEDICATION *UNUSUAL SHORTNESS OF BREATH *UNUSUAL BRUISING OR BLEEDING *URINARY PROBLEMS (pain or burning when urinating, or frequent urination) *BOWEL PROBLEMS (unusual diarrhea, constipation, pain near the anus) TENDERNESS IN MOUTH AND THROAT WITH OR WITHOUT PRESENCE OF ULCERS (sore throat, sores in mouth, or a toothache) UNUSUAL RASH, SWELLING OR PAIN  UNUSUAL VAGINAL DISCHARGE OR ITCHING   Items with * indicate a potential emergency and should be followed up as soon as possible or go to the Emergency Department if any problems should occur.  Please show the CHEMOTHERAPY ALERT CARD or IMMUNOTHERAPY ALERT CARD at check-in to the  Emergency Department and triage nurse.  Should you have questions after your visit or need to cancel or reschedule your appointment, please contact [facility] CANCER [place] AT [place]  [phone] and follow the prompts.  Office hours are 8:00 a.m. to 4:30 p.m. Monday - Friday. Please note that voicemails left after 4:00 p.m. may not be returned until the following business day.  We are closed weekends and major holidays. You have access to a nurse at all times for urgent questions. Please call the main number to the clinic [phone] and follow the prompts.  For any non-urgent questions, you may also contact your provider using MyChart. We now offer e-Visits for anyone 74 and older to request care online for non-urgent symptoms. For details visit [email or web address].   Also download the MyChart app! Go to the app store, search "MyChart", open the app, select [facility], and log in with your MyChart username and password.  Masks are optional in the cancer centers. If you would like for your care team to wear a mask while they are taking care of you, please let them know. For doctor visits, patients may have with them one support person who is at least 74 years old. At this time, visitors are not allowed in the infusion area.

## 2021-10-25 ENCOUNTER — Ambulatory Visit
Admission: RE | Admit: 2021-10-25 | Discharge: 2021-10-25 | Disposition: A | Discharge status: Home / Self Care | Payer: Medicare PPO | Source: Ambulatory Visit | Attending: Oncology | Admitting: Oncology

## 2021-10-25 ENCOUNTER — Other Ambulatory Visit: Payer: Self-pay | Admitting: Family Medicine

## 2021-10-25 DIAGNOSIS — C3491 Malignant neoplasm of unspecified part of right bronchus or lung: Secondary | ICD-10-CM | POA: Diagnosis not present

## 2021-10-25 DIAGNOSIS — J439 Emphysema, unspecified: Secondary | ICD-10-CM | POA: Diagnosis not present

## 2021-10-25 DIAGNOSIS — Z85118 Personal history of other malignant neoplasm of bronchus and lung: Secondary | ICD-10-CM | POA: Diagnosis not present

## 2021-10-25 DIAGNOSIS — J8410 Pulmonary fibrosis, unspecified: Secondary | ICD-10-CM | POA: Diagnosis not present

## 2021-10-25 DIAGNOSIS — S3210XA Unspecified fracture of sacrum, initial encounter for closed fracture: Secondary | ICD-10-CM | POA: Diagnosis not present

## 2021-10-25 DIAGNOSIS — R590 Localized enlarged lymph nodes: Secondary | ICD-10-CM | POA: Diagnosis not present

## 2021-10-25 DIAGNOSIS — N281 Cyst of kidney, acquired: Secondary | ICD-10-CM | POA: Diagnosis not present

## 2021-10-25 DIAGNOSIS — Z8541 Personal history of malignant neoplasm of cervix uteri: Secondary | ICD-10-CM | POA: Diagnosis not present

## 2021-10-25 DIAGNOSIS — J929 Pleural plaque without asbestos: Secondary | ICD-10-CM | POA: Diagnosis not present

## 2021-10-25 MED ORDER — IOHEXOL 300 MG/ML  SOLN
100.0000 mL | Freq: Once | INTRAMUSCULAR | Status: AC | PRN
  Administered 2021-10-25: 100 mL via INTRAVENOUS

## 2021-10-25 NOTE — Telephone Encounter (Signed)
Requested Prescriptions  Pending Prescriptions Disp Refills  . lisinopril (ZESTRIL) 20 MG tablet [Pharmacy Med Name: LISINOPRIL 20 MG Tablet] 90 tablet 0    Sig: TAKE 1 TABLET EVERY DAY     Cardiovascular:  ACE Inhibitors Failed - 10/25/2021  4:57 PM      Failed - Cr in normal range and within 180 days    Creatinine  Date Value Ref Range Status  05/14/2014 0.78 mg/dL Final    Comment:    0.44-1.00 NOTE: New Reference Range  04/07/14    Creatinine, Ser  Date Value Ref Range Status  10/21/2021 1.11 (H) 0.44 - 1.00 mg/dL Final         Failed - Valid encounter within last 6 months    Recent Outpatient Visits          7 months ago LUQ pain   [facility], Megan P, DO   9 months ago Essential hypertension   [place], [place], DO   11 months ago Essential hypertension   [place], [place], DO   1 year ago [place], [place]outman, DO   1 year ago [place], [place], DO             Passed - K in normal range and within 180 days    Potassium  Date Value Ref Range Status  10/21/2021 4.1 3.5 - 5.1 mmol/L Final  05/14/2014 4.3 mmol/L Final    Comment:    3.5-5.1 NOTE: New Reference Range  04/07/14          Passed - Patient is not pregnant      Passed - Last BP in normal range    BP Readings from Last 1 Encounters:  10/21/21 134/71

## 2021-11-03 ENCOUNTER — Other Ambulatory Visit: Payer: Self-pay | Admitting: *Deleted

## 2021-11-03 ENCOUNTER — Ambulatory Visit
Admission: RE | Admit: 2021-11-03 | Discharge: 2021-11-03 | Disposition: A | Discharge status: Home / Self Care | Payer: Medicare PPO | Source: Ambulatory Visit | Attending: Radiation Oncology | Admitting: Radiation Oncology

## 2021-11-03 ENCOUNTER — Encounter: Payer: Self-pay | Admitting: Radiation Oncology

## 2021-11-03 VITALS — BP 113/65 | HR 96 | Temp 98.2°F | Resp 18 | Wt 134.0 lb

## 2021-11-03 DIAGNOSIS — C770 Secondary and unspecified malignant neoplasm of lymph nodes of head, face and neck: Secondary | ICD-10-CM | POA: Insufficient documentation

## 2021-11-03 DIAGNOSIS — R911 Solitary pulmonary nodule: Secondary | ICD-10-CM

## 2021-11-03 DIAGNOSIS — C3411 Malignant neoplasm of upper lobe, right bronchus or lung: Secondary | ICD-10-CM | POA: Insufficient documentation

## 2021-11-03 DIAGNOSIS — Z923 Personal history of irradiation: Secondary | ICD-10-CM | POA: Diagnosis not present

## 2021-11-03 DIAGNOSIS — C3490 Malignant neoplasm of unspecified part of unspecified bronchus or lung: Secondary | ICD-10-CM | POA: Diagnosis not present

## 2021-11-03 DIAGNOSIS — Z8541 Personal history of malignant neoplasm of cervix uteri: Secondary | ICD-10-CM | POA: Diagnosis not present

## 2021-11-03 NOTE — Progress Notes (Signed)
Radiation Oncology Follow up Note  Name: Mary Hoover   Date:   11/03/2021 MRN:  [id number] DOB: 02-22-1945    This 76 y.o. female presents to the clinic today for 47-month follow-up status post concurrent chemoradiation therapy for stage III adenocarcinoma the right lung and patient previously treated for cervical cancer and had SBRT to her left lung.Marland Kitchen  REFERRING PROVIDER: [name], DO  HPI: Patient is a 76 year old female now out 6 months having completed concurrent chemoradiation therapy for stage III adenocarcinoma the right lung.  She has a prior history of treatment to her for cervical cancer as well as SBRT to her right lung.  Seen today in routine follow-up she states her pulmonary function is stable.  She specifically [place] cough hemoptysis chest tightness or any dysphagia.  She had a CT scan.  This month showing bandlike consolidation in the posterior right upper lobe with no focal masslike characteristics compatible with evolving postradiation changes and favored to represent evolving treatment.  There is a 4 mm left lower lobe nodule very slowly increasing in size with continued attention on follow-up suggested.  COMPLICATIONS OF TREATMENT: none  FOLLOW UP COMPLIANCE: keeps appointments   PHYSICAL EXAM:  BP 113/65 (BP Location: Left Arm, Patient Position: Sitting)   Pulse 96   Temp 98.2 F (36.8 C) (Tympanic)   Resp 18   Wt 134 lb (60.8 kg)   SpO2 100%   BMI 20.37 kg/m  Well-developed well-nourished patient in NAD. HEENT reveals PERLA, EOMI, discs not visualized.  Oral cavity is clear. No oral mucosal lesions are identified. Neck is clear without evidence of cervical or supraclavicular adenopathy. Lungs are clear to A&P. Cardiac examination is essentially unremarkable with regular rate and rhythm without murmur rub or thrill. Abdomen is benign with no organomegaly or masses noted. Motor sensory and DTR levels are equal and symmetric in the upper and lower extremities.  Cranial nerves II through XII are grossly intact. Proprioception is intact. No peripheral adenopathy or edema is identified. No motor or sensory levels are noted. Crude visual fields are within normal range.  RADIOLOGY RESULTS: CT scan reviewed compatible with above-stated findings  PLAN: The present time patient is clinically doing well very low side effect profile.  CT scan show excellent treatment response.  I have asked to see her back in 6 months with a follow-up CT scan of the chest at that time.  Patient knows to call with any concerns.  She continues close follow-up care with medical oncology.  I would like to take this opportunity to thank you for allowing me to participate in the care of your patient.Noreene Filbert, MD

## 2021-11-04 ENCOUNTER — Ambulatory Visit (INDEPENDENT_AMBULATORY_CARE_PROVIDER_SITE_OTHER): Payer: Medicare PPO | Admitting: Physician Assistant

## 2021-11-04 ENCOUNTER — Inpatient Hospital Stay: Payer: Medicare PPO

## 2021-11-04 ENCOUNTER — Telehealth: Payer: Self-pay | Admitting: Family Medicine

## 2021-11-04 ENCOUNTER — Encounter: Payer: Self-pay | Admitting: Oncology

## 2021-11-04 ENCOUNTER — Other Ambulatory Visit: Payer: Self-pay

## 2021-11-04 ENCOUNTER — Inpatient Hospital Stay: Payer: Medicare PPO | Attending: Oncology | Admitting: Oncology

## 2021-11-04 VITALS — BP 117/71 | HR 85

## 2021-11-04 DIAGNOSIS — M545 Low back pain, unspecified: Secondary | ICD-10-CM | POA: Diagnosis not present

## 2021-11-04 DIAGNOSIS — Z7689 Persons encountering health services in other specified circumstances: Secondary | ICD-10-CM | POA: Diagnosis not present

## 2021-11-04 DIAGNOSIS — D631 Anemia in chronic kidney disease: Secondary | ICD-10-CM | POA: Diagnosis not present

## 2021-11-04 DIAGNOSIS — Z720 Tobacco use: Secondary | ICD-10-CM | POA: Diagnosis not present

## 2021-11-04 DIAGNOSIS — G8929 Other chronic pain: Secondary | ICD-10-CM | POA: Diagnosis not present

## 2021-11-04 DIAGNOSIS — C539 Malignant neoplasm of cervix uteri, unspecified: Secondary | ICD-10-CM | POA: Insufficient documentation

## 2021-11-04 DIAGNOSIS — Z5112 Encounter for antineoplastic immunotherapy: Secondary | ICD-10-CM | POA: Insufficient documentation

## 2021-11-04 DIAGNOSIS — Z923 Personal history of irradiation: Secondary | ICD-10-CM | POA: Diagnosis not present

## 2021-11-04 DIAGNOSIS — L0291 Cutaneous abscess, unspecified: Secondary | ICD-10-CM | POA: Insufficient documentation

## 2021-11-04 DIAGNOSIS — C3491 Malignant neoplasm of unspecified part of right bronchus or lung: Secondary | ICD-10-CM

## 2021-11-04 DIAGNOSIS — L0211 Cutaneous abscess of neck: Secondary | ICD-10-CM

## 2021-11-04 DIAGNOSIS — Z79899 Other long term (current) drug therapy: Secondary | ICD-10-CM | POA: Diagnosis not present

## 2021-11-04 DIAGNOSIS — M549 Dorsalgia, unspecified: Secondary | ICD-10-CM | POA: Diagnosis not present

## 2021-11-04 DIAGNOSIS — N1831 Chronic kidney disease, stage 3a: Secondary | ICD-10-CM | POA: Diagnosis not present

## 2021-11-04 DIAGNOSIS — F17210 Nicotine dependence, cigarettes, uncomplicated: Secondary | ICD-10-CM | POA: Diagnosis not present

## 2021-11-04 LAB — CBC WITH DIFFERENTIAL/PLATELET
Abs Immature Granulocytes: 0.03 10*3/uL (ref 0.00–0.07)
Basophils Absolute: 0 10*3/uL (ref 0.0–0.1)
Basophils Relative: 1 %
Eosinophils Absolute: 0.2 10*3/uL (ref 0.0–0.5)
Eosinophils Relative: 3 %
HCT: 30.2 % — ABNORMAL LOW (ref 36.0–46.0)
Hemoglobin: 10.6 g/dL — ABNORMAL LOW (ref 12.0–15.0)
Immature Granulocytes: 0 %
Lymphocytes Relative: 7 %
Lymphs Abs: 0.5 10*3/uL — ABNORMAL LOW (ref 0.7–4.0)
MCH: 33.8 pg (ref 26.0–34.0)
MCHC: 35.1 g/dL (ref 30.0–36.0)
MCV: 96.2 fL (ref 80.0–100.0)
Monocytes Absolute: 0.6 10*3/uL (ref 0.1–1.0)
Monocytes Relative: 8 %
Neutro Abs: 5.7 10*3/uL (ref 1.7–7.7)
Neutrophils Relative %: 81 %
Platelets: 267 10*3/uL (ref 150–400)
RBC: 3.14 MIL/uL — ABNORMAL LOW (ref 3.87–5.11)
RDW: 12.9 % (ref 11.5–15.5)
WBC: 7.1 10*3/uL (ref 4.0–10.5)
nRBC: 0 % (ref 0.0–0.2)

## 2021-11-04 LAB — COMPREHENSIVE METABOLIC PANEL
ALT: 14 U/L (ref 0–44)
AST: 20 U/L (ref 15–41)
Albumin: 3.3 g/dL — ABNORMAL LOW (ref 3.5–5.0)
Alkaline Phosphatase: 66 U/L (ref 38–126)
Anion gap: 4 — ABNORMAL LOW (ref 5–15)
BUN: 13 mg/dL (ref 8–23)
CO2: 26 mmol/L (ref 22–32)
Calcium: 9 mg/dL (ref 8.9–10.3)
Chloride: 104 mmol/L (ref 98–111)
Creatinine, Ser: 0.99 mg/dL (ref 0.44–1.00)
GFR, Estimated: 60 mL/min — ABNORMAL LOW (ref >60)
Glucose, Bld: 108 mg/dL — ABNORMAL HIGH (ref 70–99)
Potassium: 3.8 mmol/L (ref 3.5–5.1)
Sodium: 134 mmol/L — ABNORMAL LOW (ref 135–145)
Total Bilirubin: 0.2 mg/dL — ABNORMAL LOW (ref 0.3–1.2)
Total Protein: 6.5 g/dL (ref 6.5–8.1)

## 2021-11-04 MED ORDER — HEPARIN SOD (PORK) LOCK FLUSH 100 UNIT/ML IV SOLN
500.0000 [IU] | Freq: Once | INTRAVENOUS | Status: AC
  Administered 2021-11-04: 500 [IU] via INTRAVENOUS
  Filled 2021-11-04: qty 5

## 2021-11-04 MED ORDER — SULFAMETHOXAZOLE-TRIMETHOPRIM 800-160 MG PO TABS: 1.0000 | ORAL_TABLET | Freq: Two times a day (BID) | ORAL | 0 refills | Status: AC

## 2021-11-04 NOTE — Assessment & Plan Note (Signed)
I have discussed with PCP Dr.Johnson. She will be seen this afternoon for management.

## 2021-11-04 NOTE — Progress Notes (Signed)
Patient here for follow up. Reports Tramadol has been controlling her pain well.

## 2021-11-04 NOTE — Assessment & Plan Note (Signed)
Monitor creatinine levels.  Avoid nephrotoxins. Encourage oral hydration.

## 2021-11-04 NOTE — Telephone Encounter (Signed)
Copied from [place]. Topic: General - Other >> Nov 04, 2021  8:51 AM [place] B wrote: Reason for CRM: Benjamine Mola from the cancer center wanted to let Dr Wynetta Emery know that patient has a lump on her shoulder. She didn't know which one.

## 2021-11-04 NOTE — Progress Notes (Signed)
Established Patient Office Visit  Name: Mary Hoover   MRN: [id number]    DOB: 1945/07/19   Date:11/04/2021  Today's Provider: Talitha Givens, MHS, PA-C Introduced myself to the patient as a PA-C and provided education on APPs in clinical practice.         Subjective  Chief Complaint  Chief Complaint  Patient presents with   Cyst    HPI   Cyst on the left side of neck  Reports pain and increased swelling over the past few days   Patient Active Problem List   Diagnosis Date Noted   Skin abscess 11/04/2021   Neoplasm related pain 10/21/2021   Back pain 10/21/2021   Anemia in chronic kidney disease (CODE) 10/07/2021   CKD (chronic kidney disease) stage 3, GFR 30-59 ml/min ([place]) 09/24/2021   Encounter for antineoplastic immunotherapy 05/19/2021   Hypocalcemia 03/21/2021   Anemia due to antineoplastic chemotherapy 03/07/2021   Encounter for antineoplastic chemotherapy 02/21/2021   Aortic atherosclerosis ([place]) 01/04/2021   Paratracheal lymphadenopathy 12/27/2020   Osteoarthritis of spine with radiculopathy, lumbar region 06/10/2020   Hyponatremia 02/27/2019   Cervical cancer ([place]) 02/03/2019   Goals of care, counseling/discussion 02/03/2019   Carotid atherosclerosis 11/14/2016   Myopia of both eyes 01/11/2016   Primary lung adenocarcinoma ([place])    Essential hypertension 12/12/2014   Hemoptysis 12/11/2014   Hyperlipidemia    Status post carotid endarterectomy    Tobacco use    COPD (chronic obstructive pulmonary disease) ([place]) 10/13/2013   Hypothyroidism 09/15/2013   Peripheral vascular disease ([place]) 09/15/2013    Past Surgical History:  Procedure Laterality Date   CAROTID ENDARTERECTOMY Right Oct. 2015   Dr. Lucky Cowboy   CAROTID STENOSIS Left April 2016   carotid stenosis surgery   COLONOSCOPY WITH PROPOFOL N/A 11/08/2016   Procedure: COLONOSCOPY WITH PROPOFOL;  Surgeon: Jonathon Bellows, MD;  Location: [facility] ENDOSCOPY;  Service: Gastroenterology;  Laterality: N/A;    ESOPHAGOGASTRODUODENOSCOPY (EGD) WITH PROPOFOL N/A 11/08/2016   Procedure: ESOPHAGOGASTRODUODENOSCOPY (EGD) WITH PROPOFOL;  Surgeon: Jonathon Bellows, MD;  Location[facility] END[facility]COPY;  Service: Gastroenterology;  Laterality: N/A;   FLEXIBLE BRONCHOSCOPY N/A 12/17/2014   Procedure: FLEXIBLE BRONCHOSCOPY;  Surgeon: Wilhelmina Mcardle, MD;  Location: [facility] ORS;  Service: Pulmonary;  Laterality: N/A;   INCISION AND DRAINAGE / EXCISION THYROGLOSSAL CYST  April 2011   PORTA CATH INSERTION N/A 03/03/2021   Procedure: PORTA CATH INSERTION;  Surgeon: Algernon Huxley, MD;  Location: [place] [facility];  Service: Cardiovascular;  Laterality: N/A;   VIDEO BRONCHOSCOPY WITH ENDOBRONCHIAL ULTRASOUND N/A 01/12/2021   Procedure: VIDEO BRONCHOSCOPY WITH ENDOBRONCHIAL ULTRASOUND;  Surgeon: Tyler Pita, MD;  Location: [facility] ORS;  Service: Cardiopulmonary;  Laterality: N/A;    Family History  Problem Relation Age of Onset   Congestive Heart Failure Mother    Heart disease Mother    Hypertension Mother    Hypertension Sister    Diabetes Sister    Heart disease Sister    Heart disease Maternal Uncle    Heart disease Maternal Grandmother    Stroke Maternal Grandfather    Cancer Brother        liver, lung   Heart disease Brother    Hypertension Brother    Heart attack Brother    COPD Neg Hx    Breast cancer Neg Hx     Social History   Tobacco Use   Smoking status: Every Day    Packs/day: 0.50  Years: 50.00    Total pack years: 25.00    Types: Cigarettes   Smokeless tobacco: Never   Tobacco comments:    0.5 PPD 02/10/2021  Substance Use Topics   Alcohol use: No    Alcohol/week: 0.0 standard drinks of alcohol     Current Outpatient Medications:    sulfamethoxazole-trimethoprim (BACTRIM DS) 800-160 MG tablet, Take 1 tablet by mouth 2 (two) times daily for 7 days., Disp: 14 tablet, Rfl: 0   acetaminophen (TYLENOL) 500 MG tablet, Take 500 mg by mouth every 6 (six) hours as needed for moderate pain.,  Disp: , Rfl:    alendronate (FOSAMAX) 70 MG tablet, Take 1 tablet (70 mg total) by mouth every 7 (seven) days. Take with a full glass of water on an empty stomach., Disp: 12 tablet, Rfl: 3   chlorhexidine (PERIDEX) 0.12 % solution, Use as directed 5 mLs in the mouth or throat 2 (two) times daily., Disp: 120 mL, Rfl: 0   clopidogrel (PLAVIX) 75 MG tablet, Take 1 tablet (75 mg total) by mouth daily., Disp: 90 tablet, Rfl: 3   cyclobenzaprine (FLEXERIL) 10 MG tablet, TAKE 1 TABLET AT BEDTIME, Disp: 90 tablet, Rfl: 1   Fluticasone-Umeclidin-Vilant (TRELEGY ELLIPTA) 100-62.5-25 MCG/INH AEPB, Inhale 1 puff into the lungs daily., Disp: 1 each, Rfl: 11   gabapentin (NEURONTIN) 100 MG capsule, TAKE 1 CAPSULE THREE TIMES DAILY, Disp: 270 capsule, Rfl: 0   Iron-Vitamin C 65-125 MG TABS, Take 1 tablet by mouth daily., Disp: 30 tablet, Rfl: 3   levothyroxine (SYNTHROID) 88 MCG tablet, Take 1 tablet (88 mcg total) by mouth daily., Disp: 90 tablet, Rfl: 3   lidocaine (LIDODERM) 5 %, Place 1 patch onto the skin daily. Remove & Discard patch within 12 hours or as directed by MD, Disp: 90 patch, Rfl: 1   lidocaine-prilocaine (EMLA) cream, Apply 1 application topically as needed., Disp: 60 g, Rfl: 3   lisinopril (ZESTRIL) 20 MG tablet, TAKE 1 TABLET EVERY DAY, Disp: 90 tablet, Rfl: 0   Multiple Vitamin (MULTIVITAMIN) tablet, Take 1 tablet by mouth daily., Disp: , Rfl:    omeprazole (PRILOSEC) 20 MG capsule, TAKE 1 CAPSULE EVERY DAY, Disp: 90 capsule, Rfl: 2   ondansetron (ZOFRAN) 8 MG tablet, Take 1 tablet (8 mg total) by mouth 2 (two) times daily as needed for refractory nausea / vomiting. Start on day 3 after chemo., Disp: 30 tablet, Rfl: 1   traMADol (ULTRAM) 50 MG tablet, Take 1-2 tablets (50-100 mg total) by mouth every 12 (twelve) hours as needed., Disp: 60 tablet, Rfl: 0   vitamin B-12 (CYANOCOBALAMIN) 500 MCG tablet, Take 500 mcg by mouth daily., Disp: , Rfl:   Allergies  Allergen Reactions   Atorvastatin  Other (See Comments)    dizziness    I personally reviewed active problem list, medication list, allergies with the patient/caregiver today.   Review of Systems  Skin:        Draining cyst on left side of  neck/ shoulder       Objective  Vitals:   11/04/21 1400  BP: 117/71  Pulse: 85    There is no height or weight on file to calculate BMI.  Physical Exam Vitals reviewed.  Constitutional:      General: She is awake.     Appearance: Normal appearance. She is well-developed, well-groomed and normal weight.  HENT:     Head: Normocephalic and atraumatic.  Skin:    General: Skin is warm.  Findings: Abscess present.       Neurological:     Mental Status: She is alert.  Psychiatric:        Behavior: Behavior is cooperative.     Skin Procedure- incision and drainage of abscess    Lesion Location/Size: Posterior left side of neck, Approx 7 cm in diameter draining abscess Provider: Talitha Givens, MHS, PA-C Consent:  Risks, benefits, and alternative treatments discussed and all questions were answered.  Patient elected to proceed and verbal consent obtained.  Description: Area prepped and draped using semi-sterile technique. Area locally anesthetized using 4 cc's of lidocaine 1% plain. Using a 15 blade scalpel, a  1cm incision was made above the lesion. Cyst cavity entered and copious amount of  cheesy, purulent   material expressed.  Cyst wall was not  removed  . ",    Good cosmetic appearance. Wound dressed after application of bacitracin ointment.   Complications: None Estimated blood loss: 5-10 cc  Post Procedure Instructions: Wound care instructions discussed and patient was instructed to keep area clean and dry.  Signs and symptoms of infection discussed, patient agrees to contact the office ASAP should they occur.  Dressing change recommended daily.    Assessment & Plan  Problem List Items Addressed This Visit       Musculoskeletal and Integument   Skin  abscess - Primary (Chronic)    Acute, new concern Patient presents with approx 7 cm draining abscess along posterior of left side of neck I&D was performed per procedure note above without complications Will send script for Bactrim 800-160 PO BID x 7 days  to further assist with resolution Reviewed care instructions and bandage changes with her - recommend daily bandage changes, no manipulation or squeezing, no neosporin or abx ointment Reviewed that the area may continue to drain and mild blood may be noted for a few days- this is normal but if area becomes swollen, red, more painful to touch, she should return to office.  Recommend using warm compresses to assist with further drainage and can use Tylenol PRN for pain Follow up as needed for persistent or progressing symptoms       Relevant Medications   sulfamethoxazole-trimethoprim (BACTRIM DS) 800-160 MG tablet   Other Visit Diagnoses     Encounter for incision and drainage procedure            No follow-ups on file.   I, Frida Wahlstrom E Graviel Payeur, PA-C, have reviewed all documentation for this visit. The documentation on 11/04/21 for the exam, diagnosis, procedures, and orders are all accurate and complete.   Talitha Givens, MHS, PA-C [facility]

## 2021-11-04 NOTE — Assessment & Plan Note (Signed)
Recommend smoke cessation.  

## 2021-11-04 NOTE — Assessment & Plan Note (Signed)
Continue tramadol every 6 hours as needed.

## 2021-11-04 NOTE — Assessment & Plan Note (Signed)
Treatment plan was listed above

## 2021-11-04 NOTE — Assessment & Plan Note (Signed)
Acute, new concern Patient presents with approx 7 cm draining abscess along posterior of left side of neck I&D was performed per procedure note above without complications Will send script for Bactrim 800-160 PO BID x 7 days  to further assist with resolution Reviewed care instructions and bandage changes with her - recommend daily bandage changes, no manipulation or squeezing, no neosporin or abx ointment Reviewed that the area may continue to drain and mild blood may be noted for a few days- this is normal but if area becomes swollen, red, more painful to touch, she should return to office.  Recommend using warm compresses to assist with further drainage and can use Tylenol PRN for pain Follow up as needed for persistent or progressing symptoms

## 2021-11-04 NOTE — Telephone Encounter (Signed)
Patient has appointment today at 2:00 PM

## 2021-11-04 NOTE — Progress Notes (Signed)
Hematology/Oncology Progress note [phone] [phone]      Patient Care Team: [name], DO as PCP - General (Family Medicine) Clent Jacks, RN as Oncology Nurse Navigator Noreene Filbert, MD as Radiation Oncologist (Radiation Oncology) Telford Nab, RN as Oncology Nurse Navigator [name], MD as Consulting Physician (Oncology)  ASSESSMENT & PLAN:   Cancer Staging  Cervical cancer [facility]) Staging form: Cervix Uteri, AJCC Version 9 - Clinical: FIGO Stage IIICr (cTX, cN1) - Unsigned  Primary lung adenocarcinoma ([place]) Staging form: Lung, AJCC 7th Edition - Clinical stage from 01/12/2021: T1, N1, M0 - Signed by [name], MD on 01/29/2021   Primary lung adenocarcinoma ([place]) Stage III right lung adenocarcinoma,  Recurrent S/p concurrent chemotherapy and radiation- On immunotherapy durvalumab maintenance Labs reviewed and discussed with patient.  Hold Durvalumab treatments- due to active skin infection.  We will reschedule her to come back next week.    Tobacco use Recommend smoke cessation.  Encounter for antineoplastic immunotherapy Treatment plan was listed above  CKD (chronic kidney disease) stage 3, GFR 30-59 ml/min (HCC) Monitor creatinine levels.  Avoid nephrotoxins. Encourage oral hydration.    No orders of the defined types were placed in this encounter.   All questions were answered. The patient knows to call the clinic with any problems, questions or concerns.  [name], MD, PhD [facility] Hematology Oncology 11/04/2021     CHIEF COMPLAINTS/REASON FOR VISIT:  Follow up lung cancer treatments  HISTORY OF PRESENTING ILLNESS:  # Cervix cancer Patient has developed postmenopausal vaginal bleeding and discharge.  She was noted to have a friable cervix/2 cm mass of the cervix, concerning for malignancy 12/19/2018 endometrial biopsy showed scattered atypical squamous cells suspicious for malignancy.  Predominantly necrosis  with associated inflammation. 01/01/2019 initial cervix biopsy showed at least high-grade squamous intraepithelial lesion.-HPV negative 01/22/2019, repeat vaginal wall biopsy and cervix 9:00 biopsy showed squamous cell carcinoma.    Staging images 12/31/2018 showed fluid distending the endometrial canal and or endometrial thickening to the level of cervix.  No evidence of pathological lymphadenopathy.  Haziness about the lower cervix.  7 mm irregular pulmonary nodule within the right upper lobe, suspicious for possible primary or metastatic malignancy.  Chronic pleural-parenchymal scarring/fibrosis at the lung apices.  Large amount of stool.  No bowel obstruction. 01/08/2019 PET scan showed marked hypermetabolism in the region of the cervix, compatible with the reported history of cervical cancer. Small bilateral pelvic sidewall lymph nodes discernible FDG accumulation, concerning for metastatic disease although neither lymph node is enlarged by CT size criteria. 7 mm nodule in the right upper lobe shows discernible FDG accumulation.  Questionable neoplasm, primary versus metastatic. Emphysema.   #Chronic hearing loss  # Lung nodule, FDG avid, questionable primary bronchogenic carcinoma versus metastatic cancer.Discussed with radiation oncology.  Her case was also discussed on tumor board.  Consensus reached on finishing concurrent chemoradiation treatments for cervix cancer first. Possible SBRT to lung nodule for presumed primary lung cancer.  # 02/20/2020 - 03/19/2020 concurrent chemotherapy weekly carboplatin and taxol and radiation for treatment of this cervix cancer.   #December 2021 lung nodule,was evaluated by Dr.Oaks. Options of biopsy via transthoracic or endobronchial approach vs surgical resection.  Patient opted to empiric SBRT  Patient was last seen by me on 06/25/2019.  Lost follow-up. She continues to follow with radiation oncology. 11/20/2020, CT chest with contrast showed minimal  residual of previously seen right upper lobe nodule.  0.6 x 0.4 cm.  Internal development of  mild bandlike radiation fibrosis.  Newly enlarged pretracheal lymph node, measuring 2.5 x 1.7 cm.  Highly concerning for metastatic disease.  Unchanged prominent AP window and the right hilar lymph nodes.  Emphysema and diffuse bilateral bronchial wall thickening.  Coronary artery disease. 12/13/2020, PET scan showed enlarging hypermetabolic right lower paratracheal lymph node, compatible with metastatic disease.  SUV 10.3.  Mild FDG uptake of the bilateral sacral alla with associated lucency.  Concerning for sacral insufficiency fracture.  Stable posttreatment changes of the right upper lobe nodule.  Nonspecific small solid 3 mm pulmonary nodule of the left lower lobe.  2 small to be characterized.  No evidence of FDG avid metastatic disease in the abdomen or pelvis.  Aortic atherosclerosis and emphysema.  Patient reestablish care on 12/27/2020 for lung cancer treatments   #01/12/2021 patient underwent bronchoscopy biopsy by Dr. Patsey Berthold Right paratracheal lymph node fine-needle aspiration is positive for metastatic non-small cell carcinoma, favor adenocarcinoma of lung origin.  Circulogen NGS negative PD-L1, ALK, ROS1, NTRK1/2/3,   # 02/14/2021-03/31/2021, concurrent carboplatin AUC 2 and taxol 56m/m2 with radiation.  04/26/2021, CT chest with contrast showed evolving postradiation changes in the posterior perifissural upper right lung without discrete residual measurable nodule in this location.  Right hilar/mediastinal lymphadenopathy stable to decreased.  Tiny 0.2 cm left lower lobe pulmonary nodule slightly decreased.  Positive response to treatment.  No new or progressive disease.  Three-vessel coronary arthrosclerosis.  Aortic atherosclerosis/emphysema.  INTERVAL HISTORY Mary VENARDis a 76y.o. female who has above history reviewed by me today presents for follow up visit for management of possible  recurrent lung cancer.  History of cervix cancer.    No new complaints. She tolerates immunotherapy well.  Chronic back pain, tramadol helps her pain.   Denies any SOB, cough, chest pain,  rash. + boil on her posterior neck, increasing size, painful, she plans to call her PCP hoping to get D&C done.   Review of Systems  Constitutional:  Positive for fatigue. Negative for appetite change, chills, fever and unexpected weight change.  HENT:   Positive for hearing loss. Negative for voice change.   Eyes:  Negative for eye problems.  Respiratory:  Negative for chest tightness and cough.   Cardiovascular:  Negative for chest pain.  Gastrointestinal:  Negative for abdominal distention, abdominal pain and blood in stool.  Endocrine: Negative for hot flashes.  Genitourinary:  Negative for difficulty urinating and frequency.   Musculoskeletal:  Negative for arthralgias.  Skin:  Negative for itching and rash.       Skin boil  Neurological:  Negative for extremity weakness.  Hematological:  Negative for adenopathy.  Psychiatric/Behavioral:  Negative for confusion.     MEDICAL HISTORY:  Past Medical History:  Diagnosis Date   Arthritis    Cancer ([facility]    Carotid atherosclerosis    Carotid bruit    Cervical cancer (HCC)    COPD (chronic obstructive pulmonary disease) (HCC)    emphysema   Coronary artery disease    Family history of adverse reaction to anesthesia    paternal grandfather died during surgery-pt unaware what happened   GERD (gastroesophageal reflux disease)    Hyperlipidemia    Hypertension    Hyponatremia 02/27/2019   Hypothyroidism    Infected cat bite 1980s   was hospitalized   Irregular heartbeat    Medical history non-contributory    Peripheral vascular disease ([place]    Tobacco use     SURGICAL HISTORY: Past Surgical History:  Procedure Laterality Date   CAROTID ENDARTERECTOMY Right Oct. 2015   Dr. Lucky Cowboy   CAROTID STENOSIS Left April 2016   carotid stenosis  surgery   COLONOSCOPY WITH PROPOFOL N/A 11/08/2016   Procedure: COLONOSCOPY WITH PROPOFOL;  Surgeon: Jonathon Bellows, MD;  Location: [facility] ENDOSCOPY;  Service: Gastroenterology;  Laterality: N/A;   ESOPHAGOGASTRODUODENOSCOPY (EGD) WITH PROPOFOL N/A 11/08/2016   Procedure: ESOPHAGOGASTRODUODENOSCOPY (EGD) WITH PROPOFOL;  Surgeon: Jonathon Bellows, MD;  Location: [facility] ENDOSCOPY;  Service: Gastroenterology;  Laterality: N/A;   FLEXIBLE BRONCHOSCOPY N/A 12/17/2014   Procedure: FLEXIBLE BRONCHOSCOPY;  Surgeon: Wilhelmina Mcardle, MD;  Location: [facility] ORS;  Service: Pulmonary;  Laterality: N/A;   INCISION AND DRAINAGE / EXCISION THYROGLOSSAL CYST  April 2011   PORTA CATH INSERTION N/A 03/03/2021   Procedure: PORTA CATH INSERTION;  Surgeon: Algernon Huxley, MD;  Location: [facility];  Service: Cardiovascular;  Laterality: N/A;   VIDEO BRONCHOSCOPY WITH ENDOBRONCHIAL ULTRASOUND N/A 01/12/2021   Procedure: VIDEO BRONCHOSCOPY WITH ENDOBRONCHIAL ULTRASOUND;  Surgeon: Tyler Pita, MD;  Location: [facility] ORS;  Service: Cardiopulmonary;  Laterality: N/A;    SOCIAL HISTORY: Social History   Socioeconomic History   Marital status: Married    Spouse name: Deidre Ala   Number of children: Not on file   Years of education: Not on file   Highest education level: 9th grade  Occupational History   Occupation: retired   Tobacco Use   Smoking status: Every Day    Packs/day: 0.50    Years: 50.00    Total pack years: 25.00    Types: Cigarettes   Smokeless tobacco: Never   Tobacco comments:    0.5 PPD 02/10/2021  Vaping Use   Vaping Use: Former  Substance and Sexual Activity   Alcohol use: No    Alcohol/week: 0.0 standard drinks of alcohol   Drug use: No   Sexual activity: Yes  Other Topics Concern   Not on file  Social History Narrative   Lives at home with husband   Social Determinants of Health   Financial Resource Strain: Low Risk  (03/15/2021)   Overall Financial Resource Strain (CARDIA)     Difficulty of Paying Living Expenses: Not hard at all  Food Insecurity: No Food Insecurity (03/15/2021)   Hunger Vital Sign    Worried About Running Out of Food in the Last Year: Never true    [place] in the Last Year: Never true  Transportation Needs: No Transportation Needs (03/15/2021)   PRAPARE - Hydrologist (Medical): No    Lack of Transportation (Non-Medical): No  Physical Activity: Inactive (03/15/2021)   Exercise Vital Sign    Days of Exercise per Week: 0 days    Minutes of Exercise per Session: 0 min  Stress: No Stress Concern Present (03/15/2021)   Pleak    Feeling of Stress : Not at all  Social Connections: Moderately Isolated (03/15/2021)   Social Connection and Isolation Panel [NHANES]    Frequency of Communication with Friends and Family: More than three times a week    Frequency of Social Gatherings with Friends and Family: More than three times a week    Attends Religious Services: Never    Marine scientist or Organizations: No    Attends Archivist Meetings: Never    Marital Status: Married  Human resources officer Violence: Not At Risk (03/15/2021)   Humiliation, Afraid, Rape, and Kick questionnaire  Fear of Current or Ex-Partner: No    Emotionally Abused: No    Physically Abused: No    Sexually Abused: No    FAMILY HISTORY: Family History  Problem Relation Age of Onset   Congestive Heart Failure Mother    Heart disease Mother    Hypertension Mother    Hypertension Sister    Diabetes Sister    Heart disease Sister    Heart disease Maternal Uncle    Heart disease Maternal Grandmother    Stroke Maternal Grandfather    Cancer Brother        liver, lung   Heart disease Brother    Hypertension Brother    Heart attack Brother    COPD Neg Hx    Breast cancer Neg Hx     ALLERGIES:  is allergic to atorvastatin.  MEDICATIONS:  Current  Outpatient Medications  Medication Sig Dispense Refill   acetaminophen (TYLENOL) 500 MG tablet Take 500 mg by mouth every 6 (six) hours as needed for moderate pain.     alendronate (FOSAMAX) 70 MG tablet Take 1 tablet (70 mg total) by mouth every 7 (seven) days. Take with a full glass of water on an empty stomach. 12 tablet 3   chlorhexidine (PERIDEX) 0.12 % solution Use as directed 5 mLs in the mouth or throat 2 (two) times daily. 120 mL 0   clopidogrel (PLAVIX) 75 MG tablet Take 1 tablet (75 mg total) by mouth daily. 90 tablet 3   cyclobenzaprine (FLEXERIL) 10 MG tablet TAKE 1 TABLET AT BEDTIME 90 tablet 1   Fluticasone-Umeclidin-Vilant (TRELEGY ELLIPTA) 100-62.5-25 MCG/INH AEPB Inhale 1 puff into the lungs daily. 1 each 11   gabapentin (NEURONTIN) 100 MG capsule TAKE 1 CAPSULE THREE TIMES DAILY 270 capsule 0   Iron-Vitamin C 65-125 MG TABS Take 1 tablet by mouth daily. 30 tablet 3   levothyroxine (SYNTHROID) 88 MCG tablet Take 1 tablet (88 mcg total) by mouth daily. 90 tablet 3   lidocaine (LIDODERM) 5 % Place 1 patch onto the skin daily. Remove & Discard patch within 12 hours or as directed by MD 90 patch 1   lidocaine-prilocaine (EMLA) cream Apply 1 application topically as needed. 60 g 3   lisinopril (ZESTRIL) 20 MG tablet TAKE 1 TABLET EVERY DAY 90 tablet 0   Multiple Vitamin (MULTIVITAMIN) tablet Take 1 tablet by mouth daily.     omeprazole (PRILOSEC) 20 MG capsule TAKE 1 CAPSULE EVERY DAY 90 capsule 2   ondansetron (ZOFRAN) 8 MG tablet Take 1 tablet (8 mg total) by mouth 2 (two) times daily as needed for refractory nausea / vomiting. Start on day 3 after chemo. 30 tablet 1   traMADol (ULTRAM) 50 MG tablet Take 1-2 tablets (50-100 mg total) by mouth every 12 (twelve) hours as needed. 60 tablet 0   vitamin B-12 (CYANOCOBALAMIN) 500 MCG tablet Take 500 mcg by mouth daily.     No current facility-administered medications for this visit.     PHYSICAL EXAMINATION: ECOG PERFORMANCE STATUS:  1 - Symptomatic but completely ambulatory Vitals:   11/04/21 0830  BP: 128/68  Pulse: 95  Resp: 18  Temp: 98.3 F (36.8 C)  SpO2: 100%    Filed Weights   11/04/21 0830  Weight: 133 lb 12.8 oz (60.7 kg)     Physical Exam Constitutional:      General: She is not in acute distress. HENT:     Head: Normocephalic and atraumatic.  Eyes:     General: No  scleral icterus.    Pupils: Pupils are equal, round, and reactive to light.  Cardiovascular:     Rate and Rhythm: Normal rate.  Pulmonary:     Effort: Pulmonary effort is normal. No respiratory distress.     Breath sounds: No wheezing.     Comments: Decreased breath sound bilaterally  Abdominal:     General: Bowel sounds are normal. There is no distension.     Palpations: Abdomen is soft.  Musculoskeletal:        General: No deformity. Normal range of motion.     Cervical back: Normal range of motion and neck supple.     Comments: Trace edema bilaterally  Skin:    General: Skin is warm and dry.     Comments: Posterior neck skin abscess   Neurological:     Mental Status: She is alert and oriented to person, place, and time. Mental status is at baseline.     Cranial Nerves: No cranial nerve deficit.     Coordination: Coordination normal.  Psychiatric:        Mood and Affect: Mood normal.      LABORATORY DATA:  I have reviewed the data as listed    Latest Ref Rng & Units 11/04/2021    8:16 AM 10/21/2021    8:11 AM 10/07/2021    8:10 AM  CBC  WBC 4.0 - 10.5 K/uL 7.1  6.7  6.2   Hemoglobin 12.0 - 15.0 g/dL 10.6  10.5  9.8   Hematocrit 36.0 - 46.0 % 30.2  30.3  28.9   Platelets 150 - 400 K/uL 267  273  299       Latest Ref Rng & Units 11/04/2021    8:16 AM 10/21/2021    8:11 AM 10/07/2021    8:10 AM  CMP  Glucose 70 - 99 mg/dL 108  106  111   BUN 8 - 23 mg/dL _0 Creatinine 0.44 - 1.00 mg/dL 0.99  1.11  1.05   Sodium 135 - 145 mmol/L 134  132  133   Potassium 3.5 - 5.1 mmol/L 3.8  4.1  4.2   Chloride 98 -  111 mmol/L 104  100  102   CO2 22 - 32 mmol/L _1 Calcium 8.9 - 10.3 mg/dL 9.0  9.1  8.6   Total Protein 6.5 - 8.1 g/dL 6.5  6.6  6.1   Total Bilirubin 0.3 - 1.2 mg/dL 0.2  0.4  0.3   Alkaline Phos 38 - 126 U/L 66  68  62   AST 15 - 41 U/L _2 ALT 0 - 44 U/L _3 Iron/TIBC/Ferritin/ %Sat    Component Value Date/Time   IRON 90 05/04/2021 0929   TIBC 321 05/04/2021 0929   FERRITIN 52 05/04/2021 0929   IRONPCTSAT 28 05/04/2021 0929      RADIOGRAPHIC STUDIES: I have personally reviewed the radiological images as listed and agreed with the findings in the report. CT CHEST ABDOMEN PELVIS W CONTRAST  Result Date: 10/26/2021 CLINICAL DATA:  A 76 year old female with history of lung and cervical cancer presents for follow-up. * Tracking Code: BO * EXAM: CT CHEST, ABDOMEN, AND PELVIS WITH CONTRAST TECHNIQUE: Multidetector CT imaging of the chest, abdomen and pelvis was performed following the standard protocol during bolus administration of intravenous contrast. RADIATION DOSE REDUCTION: This exam was performed  according to the departmental dose-optimization program which includes automated exposure control, adjustment of the mA and/or kV according to patient size and/or use of iterative reconstruction technique. CONTRAST:  114m OMNIPAQUE IOHEXOL 300 MG/ML  SOLN COMPARISON:  July 25, 2021 FINDINGS: CT CHEST FINDINGS Cardiovascular: LEFT-sided Port-A-Cath terminates in the upper RIGHT atrium. Calcified and noncalcified aortic atherosclerotic plaque no pericardial effusion or thickening. Central pulmonary vasculature is normal caliber. Mediastinum/Nodes: No adenopathy in the chest. Mild thickening of the esophagus is unchanged likely relating to post treatment changes. Stable 9 mm RIGHT hilar lymph node. Lungs/Pleura: Pulmonary emphysema as before. Bandlike consolidative changes in the posterior RIGHT upper lobe appear more organized at the periphery. No focal masslike  characteristics. Stable granuloma at the RIGHT lung base with calcification. No consolidation or sign of pleural effusion. Nodule in the LEFT lower lobe a 4 mm may be very slowly increasing in size over time, certainly as compared to March of 2023. (Image 92/3) Dependent pleural thickening along the posterior RIGHT chest is stable some of which could reflect post treatment changes particularly in the upper chest along the plane of post radiation changes. Small amounts material is seen in the dependent trachea and RIGHT mainstem bronchus not changed from previous imaging. Musculoskeletal: No chest wall mass, see below for full musculoskeletal details. CT ABDOMEN PELVIS FINDINGS Hepatobiliary: Lobular hepatic contours. Portal vein is patent. No pericholecystic stranding or biliary duct dilation. No suspicious hepatic lesion. Pancreas: Normal, without mass, inflammation or ductal dilatation. Spleen: Normal. Adrenals/Urinary Tract: Adrenal glands are unremarkable. Symmetric renal enhancement. No sign of hydronephrosis. No suspicious renal lesion or perinephric stranding. Stable cyst in the posterior interpolar RIGHT kidney measuring 12 x 8 mm. No dedicated imaging follow-up is recommended for this finding. Urinary bladder is grossly unremarkable. Stomach/Bowel: No perigastric stranding. No sign of small bowel obstruction or acute colonic process. Appendix is normal. Vascular/Lymphatic: Aortic atherosclerosis. No sign of aneurysm. Smooth contour of the IVC. There is no gastrohepatic or hepatoduodenal ligament lymphadenopathy. No retroperitoneal or mesenteric lymphadenopathy. No pelvic sidewall lymphadenopathy. Atherosclerotic changes are moderate to marked both calcified and noncalcified plaque present in the abdominal aorta. Reproductive: Unremarkable by CT. Other: No ascites. Musculoskeletal: Sclerosis of the sacrum with similar pattern of sclerosis compared to prior imaging from November of 2022. Signs of sacral  insufficiency fracture visible along the RIGHT and LEFT sacral ala also similar to November of 2022. No destructive bone finding. No acute bone process. Spinal degenerative changes. IMPRESSION: 1. Bandlike consolidative changes in the posterior RIGHT upper lobe appear more organized at the periphery. No focal masslike characteristics. Findings are compatible with evolving post treatment changes. Findings are favored to represent evolving post treatment changes. 2. Nodule in the LEFT lower lobe a 4 mm may be very slowly increasing in size over time, certainly as compared to March of 2023. Continued attention on follow-up is suggested. 3. Stable 9 mm RIGHT hilar lymph node. 4. Evidence of sacral insufficiency fractures not substantially changed. 5. Pulmonary emphysema and aortic atherosclerosis. Electronically Signed   By: GZetta BillsM.D.   On: 10/26/2021 11:47

## 2021-11-04 NOTE — Assessment & Plan Note (Addendum)
Stage III right lung adenocarcinoma,  Recurrent S/p concurrent chemotherapy and radiation- On immunotherapy durvalumab maintenance Labs reviewed and discussed with patient.  Hold Durvalumab treatments- due to active skin infection.  We will reschedule her to come back next week.

## 2021-11-10 ENCOUNTER — Other Ambulatory Visit: Payer: Self-pay | Admitting: Oncology

## 2021-11-10 DIAGNOSIS — C3491 Malignant neoplasm of unspecified part of right bronchus or lung: Secondary | ICD-10-CM

## 2021-11-16 ENCOUNTER — Inpatient Hospital Stay: Payer: Medicare PPO

## 2021-11-16 ENCOUNTER — Inpatient Hospital Stay (HOSPITAL_BASED_OUTPATIENT_CLINIC_OR_DEPARTMENT_OTHER): Payer: Medicare PPO | Admitting: Oncology

## 2021-11-16 ENCOUNTER — Encounter: Payer: Self-pay | Admitting: Oncology

## 2021-11-16 VITALS — BP 108/62 | HR 87 | Temp 98.2°F | Resp 16 | Wt 132.9 lb

## 2021-11-16 DIAGNOSIS — Z720 Tobacco use: Secondary | ICD-10-CM

## 2021-11-16 DIAGNOSIS — Z5112 Encounter for antineoplastic immunotherapy: Secondary | ICD-10-CM | POA: Diagnosis not present

## 2021-11-16 DIAGNOSIS — L0211 Cutaneous abscess of neck: Secondary | ICD-10-CM

## 2021-11-16 DIAGNOSIS — Z923 Personal history of irradiation: Secondary | ICD-10-CM | POA: Diagnosis not present

## 2021-11-16 DIAGNOSIS — D631 Anemia in chronic kidney disease: Secondary | ICD-10-CM

## 2021-11-16 DIAGNOSIS — C539 Malignant neoplasm of cervix uteri, unspecified: Secondary | ICD-10-CM | POA: Diagnosis not present

## 2021-11-16 DIAGNOSIS — C3491 Malignant neoplasm of unspecified part of right bronchus or lung: Secondary | ICD-10-CM

## 2021-11-16 DIAGNOSIS — G8929 Other chronic pain: Secondary | ICD-10-CM

## 2021-11-16 DIAGNOSIS — M545 Low back pain, unspecified: Secondary | ICD-10-CM | POA: Diagnosis not present

## 2021-11-16 DIAGNOSIS — F17210 Nicotine dependence, cigarettes, uncomplicated: Secondary | ICD-10-CM | POA: Diagnosis not present

## 2021-11-16 DIAGNOSIS — Z79899 Other long term (current) drug therapy: Secondary | ICD-10-CM | POA: Diagnosis not present

## 2021-11-16 DIAGNOSIS — M549 Dorsalgia, unspecified: Secondary | ICD-10-CM | POA: Diagnosis not present

## 2021-11-16 LAB — CBC WITH DIFFERENTIAL/PLATELET
Abs Immature Granulocytes: 0.03 10*3/uL (ref 0.00–0.07)
Basophils Absolute: 0 10*3/uL (ref 0.0–0.1)
Basophils Relative: 1 %
Eosinophils Absolute: 0.2 10*3/uL (ref 0.0–0.5)
Eosinophils Relative: 3 %
HCT: 31.9 % — ABNORMAL LOW (ref 36.0–46.0)
Hemoglobin: 10.5 g/dL — ABNORMAL LOW (ref 12.0–15.0)
Immature Granulocytes: 1 %
Lymphocytes Relative: 9 %
Lymphs Abs: 0.5 10*3/uL — ABNORMAL LOW (ref 0.7–4.0)
MCH: 32.1 pg (ref 26.0–34.0)
MCHC: 32.9 g/dL (ref 30.0–36.0)
MCV: 97.6 fL (ref 80.0–100.0)
Monocytes Absolute: 0.5 10*3/uL (ref 0.1–1.0)
Monocytes Relative: 8 %
Neutro Abs: 4.6 10*3/uL (ref 1.7–7.7)
Neutrophils Relative %: 78 %
Platelets: 290 10*3/uL (ref 150–400)
RBC: 3.27 MIL/uL — ABNORMAL LOW (ref 3.87–5.11)
RDW: 12.9 % (ref 11.5–15.5)
WBC: 5.7 10*3/uL (ref 4.0–10.5)
nRBC: 0 % (ref 0.0–0.2)

## 2021-11-16 LAB — COMPREHENSIVE METABOLIC PANEL
ALT: 10 U/L (ref 0–44)
AST: 20 U/L (ref 15–41)
Albumin: 3.5 g/dL (ref 3.5–5.0)
Alkaline Phosphatase: 62 U/L (ref 38–126)
Anion gap: 6 (ref 5–15)
BUN: 14 mg/dL (ref 8–23)
CO2: 23 mmol/L (ref 22–32)
Calcium: 9.2 mg/dL (ref 8.9–10.3)
Chloride: 104 mmol/L (ref 98–111)
Creatinine, Ser: 1.48 mg/dL — ABNORMAL HIGH (ref 0.44–1.00)
GFR, Estimated: 37 mL/min — ABNORMAL LOW (ref >60)
Glucose, Bld: 88 mg/dL (ref 70–99)
Potassium: 4.5 mmol/L (ref 3.5–5.1)
Sodium: 133 mmol/L — ABNORMAL LOW (ref 135–145)
Total Bilirubin: 0.4 mg/dL (ref 0.3–1.2)
Total Protein: 6.7 g/dL (ref 6.5–8.1)

## 2021-11-16 MED ORDER — DOXYCYCLINE HYCLATE 100 MG PO TABS: 100.0000 mg | ORAL_TABLET | Freq: Two times a day (BID) | ORAL | 0 refills | Status: DC

## 2021-11-16 MED ORDER — SODIUM CHLORIDE 0.9 % IV SOLN
10.0000 mg/kg | Freq: Once | INTRAVENOUS | Status: AC
  Administered 2021-11-16: 620 mg via INTRAVENOUS
  Filled 2021-11-16: qty 10

## 2021-11-16 MED ORDER — SODIUM CHLORIDE 0.9 % IV SOLN
Freq: Once | INTRAVENOUS | Status: AC
  Filled 2021-11-16: qty 250

## 2021-11-16 MED ORDER — HEPARIN SOD (PORK) LOCK FLUSH 100 UNIT/ML IV SOLN
500.0000 [IU] | Freq: Once | INTRAVENOUS | Status: AC | PRN
  Filled 2021-11-16: qty 5

## 2021-11-16 MED ORDER — HEPARIN SOD (PORK) LOCK FLUSH 100 UNIT/ML IV SOLN
INTRAVENOUS | Status: AC
  Administered 2021-11-16: 500 [IU]
  Filled 2021-11-16: qty 5

## 2021-11-16 MED ORDER — SODIUM CHLORIDE 0.9% FLUSH
10.0000 mL | INTRAVENOUS | Status: DC | PRN
  Administered 2021-11-16: 10 mL via INTRAVENOUS
  Filled 2021-11-16: qty 10

## 2021-11-16 NOTE — Progress Notes (Signed)
Pt in for follow up, reports had a sore that was lanced in MD office on back of neck that PCP had prescribed antibiotic but pt only took for  3 days because she states it made her nauseous and she report having vomiting which is she related to antibiotic. Pt reports once is discontinued med she started feeling better and had not further episodes of nausea or vomiting.

## 2021-11-16 NOTE — Assessment & Plan Note (Signed)
Recommend smoke cessation.  

## 2021-11-16 NOTE — Patient Instructions (Signed)
[facility] AT [place]  Discharge Instructions: Thank you for choosing [facility] to provide your oncology and hematology care.  If you have a lab appointment with the [facility], please go directly to the [facility] and check in at the registration area.  Wear comfortable clothing and clothing appropriate for easy access to any Portacath or PICC line.   We strive to give you quality time with your provider. You may need to reschedule your appointment if you arrive late (15 or more minutes).  Arriving late affects you and other patients whose appointments are after yours.  Also, if you miss three or more appointments without notifying the office, you may be dismissed from the clinic at the provider's discretion.      For prescription refill requests, have your pharmacy contact our office and allow 72 hours for refills to be completed.    Today you received the following chemotherapy and/or immunotherapy agents: Durvalumab     To help prevent nausea and vomiting after your treatment, we encourage you to take your nausea medication as directed.  BELOW ARE SYMPTOMS THAT SHOULD BE REPORTED IMMEDIATELY: *FEVER GREATER THAN 100.4 F (38 C) OR HIGHER *CHILLS OR SWEATING *NAUSEA AND VOMITING THAT IS NOT CONTROLLED WITH YOUR NAUSEA MEDICATION *UNUSUAL SHORTNESS OF BREATH *UNUSUAL BRUISING OR BLEEDING *URINARY PROBLEMS (pain or burning when urinating, or frequent urination) *BOWEL PROBLEMS (unusual diarrhea, constipation, pain near the anus) TENDERNESS IN MOUTH AND THROAT WITH OR WITHOUT PRESENCE OF ULCERS (sore throat, sores in mouth, or a toothache) UNUSUAL RASH, SWELLING OR PAIN  UNUSUAL VAGINAL DISCHARGE OR ITCHING   Items with * indicate a potential emergency and should be followed up as soon as possible or go to the Emergency Department if any problems should occur.  Please show the CHEMOTHERAPY ALERT CARD or IMMUNOTHERAPY ALERT CARD at check-in to  the Emergency Department and triage nurse.  Should you have questions after your visit or need to cancel or reschedule your appointment, please contact [facility] CANCER [place] AT [place]  [phone] and follow the prompts.  Office hours are 8:00 a.m. to 4:30 p.m. Monday - Friday. Please note that voicemails left after 4:00 p.m. may not be returned until the following business day.  We are closed weekends and major holidays. You have access to a nurse at all times for urgent questions. Please call the main number to the clinic [phone] and follow the prompts.  For any non-urgent questions, you may also contact your provider using MyChart. We now offer e-Visits for anyone 3 and older to request care online for non-urgent symptoms. For details visit [email or web address].   Also download the MyChart app! Go to the app store, search "MyChart", open the app, select [place], and log in with your MyChart username and password.  Masks are optional in the cancer centers. If you would like for your care team to wear a mask while they are taking care of you, please let them know. For doctor visits, patients may have with them one support person who is at least 74 years old. At this time, visitors are not allowed in the infusion area.

## 2021-11-16 NOTE — Progress Notes (Signed)
Hematology/Oncology Progress note [phone] [phone]      Patient Care Team: Mary Roys, DO as PCP - General (Family Medicine) Mary Jacks, RN as Oncology Nurse Navigator Mary Filbert, MD as Radiation Oncologist (Radiation Oncology) Mary Nab, RN as Oncology Nurse Navigator Mary Server, MD as Consulting Physician (Oncology)  ASSESSMENT & PLAN:   Cancer Staging  Cervical cancer [facility]) Staging form: Cervix Uteri, AJCC Version 9 - Clinical: FIGO Stage IIICr (cTX, cN1) - Unsigned  Primary lung adenocarcinoma ([place]) Staging form: Lung, AJCC 7th Edition - Clinical stage from 01/12/2021: T1, N1, M0 - Signed by Mary Server, MD on 01/29/2021   Primary lung adenocarcinoma ([place]) Stage III right lung adenocarcinoma,  Recurrent S/p concurrent chemotherapy and radiation- On immunotherapy durvalumab maintenance Labs reviewed and discussed with patient.  Proceed with Durvalumab treatments   Tobacco use Recommend smoke cessation.  Skin abscess S/p I&D.  Did not tolerate Bactrim.  Recommend patient to take doxycycline 113m BID x 5 days. Rx sent. Side effects were discussed with her.   Back pain Continue tramadol every 6 hours as needed.    Anemia in chronic kidney disease (CODE) Recommend patient to take Vitron C daily  Orders Placed This Encounter  Procedures   CBC with Differential    Standing Status:   Future    Standing Expiration Date:   12/29/2022   Comprehensive metabolic panel    Standing Status:   Future    Standing Expiration Date:   12/29/2022   Follow up in 2 weeks.   All questions were answered. The patient knows to call the clinic with any problems, questions or concerns.  ZEarlie Server MD, PhD [facility] Oncology 11/16/2021     CHIEF COMPLAINTS/REASON FOR VISIT:  Follow up lung cancer treatments  HISTORY OF PRESENTING ILLNESS:  # Cervix cancer Patient has developed postmenopausal vaginal bleeding and  discharge.  She was noted to have a friable cervix/2 cm mass of the cervix, concerning for malignancy 12/19/2018 endometrial biopsy showed scattered atypical squamous cells suspicious for malignancy.  Predominantly necrosis with associated inflammation. 01/01/2019 initial cervix biopsy showed at least high-grade squamous intraepithelial lesion.-HPV negative 01/22/2019, repeat vaginal wall biopsy and cervix 9:00 biopsy showed squamous cell carcinoma.    Staging images 12/31/2018 showed fluid distending the endometrial canal and or endometrial thickening to the level of cervix.  No evidence of pathological lymphadenopathy.  Haziness about the lower cervix.  7 mm irregular pulmonary nodule within the right upper lobe, suspicious for possible primary or metastatic malignancy.  Chronic pleural-parenchymal scarring/fibrosis at the lung apices.  Large amount of stool.  No bowel obstruction. 01/08/2019 PET scan showed marked hypermetabolism in the region of the cervix, compatible with the reported history of cervical cancer. Small bilateral pelvic sidewall lymph nodes discernible FDG accumulation, concerning for metastatic disease although neither lymph node is enlarged by CT size criteria. 7 mm nodule in the right upper lobe shows discernible FDG accumulation.  Questionable neoplasm, primary versus metastatic. Emphysema.   #Chronic hearing loss  # Lung nodule, FDG avid, questionable primary bronchogenic carcinoma versus metastatic cancer.Discussed with radiation oncology.  Her case was also discussed on tumor board.  Consensus reached on finishing concurrent chemoradiation treatments for cervix cancer first. Possible SBRT to lung nodule for presumed primary lung cancer.  # 02/20/2020 - 03/19/2020 concurrent chemotherapy weekly carboplatin and taxol and radiation for treatment of this cervix cancer.   #December 2021 lung nodule,was evaluated by Dr.Oaks. Options of biopsy via transthoracic or  endobronchial  approach vs surgical resection.  Patient opted to empiric SBRT  Patient was last seen by me on 06/25/2019.  Lost follow-up. She continues to follow with radiation oncology. 11/20/2020, CT chest with contrast showed minimal residual of previously seen right upper lobe nodule.  0.6 x 0.4 cm.  Internal development of mild bandlike radiation fibrosis.  Newly enlarged pretracheal lymph node, measuring 2.5 x 1.7 cm.  Highly concerning for metastatic disease.  Unchanged prominent AP window and the right hilar lymph nodes.  Emphysema and diffuse bilateral bronchial wall thickening.  Coronary artery disease. 12/13/2020, PET scan showed enlarging hypermetabolic right lower paratracheal lymph node, compatible with metastatic disease.  SUV 10.3.  Mild FDG uptake of the bilateral sacral alla with associated lucency.  Concerning for sacral insufficiency fracture.  Stable posttreatment changes of the right upper lobe nodule.  Nonspecific small solid 3 mm pulmonary nodule of the left lower lobe.  2 small to be characterized.  No evidence of FDG avid metastatic disease in the abdomen or pelvis.  Aortic atherosclerosis and emphysema.  Patient reestablish care on 12/27/2020 for lung cancer treatments   #01/12/2021 patient underwent bronchoscopy biopsy by Dr. Patsey Berthold Right paratracheal lymph node fine-needle aspiration is positive for metastatic non-small cell carcinoma, favor adenocarcinoma of lung origin.  Circulogen NGS negative PD-L1, ALK, ROS1, NTRK1/2/3,   # 02/14/2021-03/31/2021, concurrent carboplatin AUC 2 and taxol 23m/m2 with radiation.  04/26/2021, CT chest with contrast showed evolving postradiation changes in the posterior perifissural upper right lung without discrete residual measurable nodule in this location.  Right hilar/mediastinal lymphadenopathy stable to decreased.  Tiny 0.2 cm left lower lobe pulmonary nodule slightly decreased.  Positive response to treatment.  No new or progressive disease.   Three-vessel coronary arthrosclerosis.  Aortic atherosclerosis/emphysema.  INTERVAL HISTORY Mary ALBARRACINis a 76y.o. female who has above history reviewed by me today presents for follow up visit for management of possible recurrent lung cancer.  History of cervix cancer.    No new complaints. She tolerates immunotherapy well.  Chronic back pain, tramadol helps her pain.   Denies any SOB, cough, chest pain,  rash. + skin abscess, s/p I&D.  She did not tolerated bactrim which was prescribed by pcp.  Symptom improved, still small amount of pus discharge.   Review of Systems  Constitutional:  Positive for fatigue. Negative for appetite change, chills, fever and unexpected weight change.  HENT:   Positive for hearing loss. Negative for voice change.   Eyes:  Negative for eye problems.  Respiratory:  Negative for chest tightness and cough.   Cardiovascular:  Negative for chest pain.  Gastrointestinal:  Negative for abdominal distention, abdominal pain and blood in stool.  Endocrine: Negative for hot flashes.  Genitourinary:  Negative for difficulty urinating and frequency.   Musculoskeletal:  Negative for arthralgias.  Skin:  Negative for itching and rash.       Skin boil  Neurological:  Negative for extremity weakness.  Hematological:  Negative for adenopathy.  Psychiatric/Behavioral:  Negative for confusion.     MEDICAL HISTORY:  Past Medical History:  Diagnosis Date   Arthritis    Cancer ([place]    Carotid atherosclerosis    Carotid bruit    Cervical cancer (HCC)    COPD (chronic obstructive pulmonary disease) (HCC)    emphysema   Coronary artery disease    Family history of adverse reaction to anesthesia    paternal grandfather died during surgery-pt unaware what happened   GERD (gastroesophageal  reflux disease)    Hyperlipidemia    Hypertension    Hyponatremia 02/27/2019   Hypothyroidism    Infected cat bite 1980s   was hospitalized   Irregular heartbeat    Medical  history non-contributory    Peripheral vascular disease ([place])    Tobacco use     SURGICAL HISTORY: Past Surgical History:  Procedure Laterality Date   CAROTID ENDARTERECTOMY Right Oct. 2015   Dr. Lucky Cowboy   CAROTID STENOSIS Left April 2016   carotid stenosis surgery   COLONOSCOPY WITH PROPOFOL N/A 11/08/2016   Procedure: COLONOSCOPY WITH PROPOFOL;  Surgeon: Jonathon Bellows, MD;  Location: [facility] ENDOSCOPY;  Service: Gastroenterology;  Laterality: N/A;   ESOPHAGOGASTRODUODENOSCOPY (EGD) WITH PROPOFOL N/A 11/08/2016   Procedure: ESOPHAGOGASTRODUODENOSCOPY (EGD) WITH PROPOFOL;  Surgeon: Jonathon Bellows, MD;  Location: [facility] ENDOSCOPY;  Service: Gastroenterology;  Laterality: N/A;   FLEXIBLE BRONCHOSCOPY N/A 12/17/2014   Procedure: FLEXIBLE BRONCHOSCOPY;  Surgeon: Wilhelmina Mcardle, MD;  Location: [facility] ORS;  Service: Pulmonary;  Laterality: N/A;   INCISION AND DRAINAGE / EXCISION THYROGLOSSAL CYST  April 2011   PORTA CATH INSERTION N/A 03/03/2021   Procedure: PORTA CATH INSERTION;  Surgeon: Algernon Huxley, MD;  Location: [place] CV LAB;  Service: Cardiovascular;  Laterality: N/A;   VIDEO BRONCHOSCOPY WITH ENDOBRONCHIAL ULTRASOUND N/A 01/12/2021   Procedure: VIDEO BRONCHOSCOPY WITH ENDOBRONCHIAL ULTRASOUND;  Surgeon: Tyler Pita, MD;  Location: [facility] ORS;  Service: Cardiopulmonary;  Laterality: N/A;    SOCIAL HISTORY: Social History   Socioeconomic History   Marital status: Married    Spouse name: Deidre Ala   Number of children: Not on file   Years of education: Not on file   Highest education level: 9th grade  Occupational History   Occupation: retired   Tobacco Use   Smoking status: Every Day    Packs/day: 0.50    Years: 50.00    Total pack years: 25.00    Types: Cigarettes   Smokeless tobacco: Never   Tobacco comments:    0.5 PPD 02/10/2021  Vaping Use   Vaping Use: Former  Substance and Sexual Activity   Alcohol use: No    Alcohol/week: 0.0 standard drinks of alcohol   Drug use: No    Sexual activity: Yes  Other Topics Concern   Not on file  Social History Narrative   Lives at home with husband   Social Determinants of Health   Financial Resource Strain: Low Risk  (03/15/2021)   Overall Financial Resource Strain (CARDIA)    Difficulty of Paying Living Expenses: Not hard at all  Food Insecurity: No Food Insecurity (03/15/2021)   Hunger Vital Sign    Worried About Running Out of Food in the Last Year: Never true    [place] in the Last Year: Never true  Transportation Needs: No Transportation Needs (03/15/2021)   PRAPARE - Hydrologist (Medical): No    Lack of Transportation (Non-Medical): No  Physical Activity: Inactive (03/15/2021)   Exercise Vital Sign    Days of Exercise per Week: 0 days    Minutes of Exercise per Session: 0 min  Stress: No Stress Concern Present (03/15/2021)   [place]    Feeling of Stress : Not at all  Social Connections: Moderately Isolated (03/15/2021)   Social Connection and Isolation Panel [NHANES]    Frequency of Communication with Friends and Family: More than three times a week    Frequency  of Social Gatherings with Friends and Family: More than three times a week    Attends Religious Services: Never    Marine scientist or Organizations: No    Attends Archivist Meetings: Never    Marital Status: Married  Human resources officer Violence: Not At Risk (03/15/2021)   Humiliation, Afraid, Rape, and Kick questionnaire    Fear of Current or Ex-Partner: No    Emotionally Abused: No    Physically Abused: No    Sexually Abused: No    FAMILY HISTORY: Family History  Problem Relation Age of Onset   Congestive Heart Failure Mother    Heart disease Mother    Hypertension Mother    Hypertension Sister    Diabetes Sister    Heart disease Sister    Heart disease Maternal Uncle    Heart disease Maternal Grandmother    Stroke  Maternal Grandfather    Cancer Brother        liver, lung   Heart disease Brother    Hypertension Brother    Heart attack Brother    COPD Neg Hx    Breast cancer Neg Hx     ALLERGIES:  is allergic to atorvastatin.  MEDICATIONS:  Current Outpatient Medications  Medication Sig Dispense Refill   acetaminophen (TYLENOL) 500 MG tablet Take 500 mg by mouth every 6 (six) hours as needed for moderate pain.     alendronate (FOSAMAX) 70 MG tablet Take 1 tablet (70 mg total) by mouth every 7 (seven) days. Take with a full glass of water on an empty stomach. 12 tablet 3   chlorhexidine (PERIDEX) 0.12 % solution Use as directed 5 mLs in the mouth or throat 2 (two) times daily. 120 mL 0   clopidogrel (PLAVIX) 75 MG tablet Take 1 tablet (75 mg total) by mouth daily. 90 tablet 3   cyclobenzaprine (FLEXERIL) 10 MG tablet TAKE 1 TABLET AT BEDTIME 90 tablet 1   Fluticasone-Umeclidin-Vilant (TRELEGY ELLIPTA) 100-62.5-25 MCG/INH AEPB Inhale 1 puff into the lungs daily. 1 each 11   gabapentin (NEURONTIN) 100 MG capsule TAKE 1 CAPSULE THREE TIMES DAILY 270 capsule 0   Iron-Vitamin C 65-125 MG TABS Take 1 tablet by mouth daily. 30 tablet 3   levothyroxine (SYNTHROID) 88 MCG tablet Take 1 tablet (88 mcg total) by mouth daily. 90 tablet 3   lidocaine (LIDODERM) 5 % Place 1 patch onto the skin daily. Remove & Discard patch within 12 hours or as directed by MD 90 patch 1   lidocaine-prilocaine (EMLA) cream Apply 1 application topically as needed. 60 g 3   lisinopril (ZESTRIL) 20 MG tablet TAKE 1 TABLET EVERY DAY 90 tablet 0   Multiple Vitamin (MULTIVITAMIN) tablet Take 1 tablet by mouth daily.     omeprazole (PRILOSEC) 20 MG capsule TAKE 1 CAPSULE EVERY DAY 90 capsule 2   ondansetron (ZOFRAN) 8 MG tablet Take 1 tablet (8 mg total) by mouth 2 (two) times daily as needed for refractory nausea / vomiting. Start on day 3 after chemo. 30 tablet 1   traMADol (ULTRAM) 50 MG tablet Take 1-2 tablets (50-100 mg total) by  mouth every 12 (twelve) hours as needed. 60 tablet 0   vitamin B-12 (CYANOCOBALAMIN) 500 MCG tablet Take 500 mcg by mouth daily.     doxycycline (VIBRA-TABS) 100 MG tablet Take 1 tablet (100 mg total) by mouth 2 (two) times daily. 10 tablet 0   No current facility-administered medications for this visit.   Facility-Administered Medications Ordered  in Other Visits  Medication Dose Route Frequency Provider Last Rate Last Admin   sodium chloride flush (NS) 0.9 % injection 10 mL  10 mL Intravenous PRN Mary Server, MD   10 mL at 11/16/21 1004     PHYSICAL EXAMINATION: ECOG PERFORMANCE STATUS: 1 - Symptomatic but completely ambulatory Vitals:   11/16/21 1022  BP: 108/62  Pulse: 87  Resp: 16  Temp: 98.2 F (36.8 C)  SpO2: 96%    Filed Weights   11/16/21 1022  Weight: 132 lb 14.4 oz (60.3 kg)     Physical Exam Constitutional:      General: She is not in acute distress. HENT:     Head: Normocephalic and atraumatic.  Eyes:     General: No scleral icterus.    Pupils: Pupils are equal, round, and reactive to light.  Cardiovascular:     Rate and Rhythm: Normal rate.  Pulmonary:     Effort: Pulmonary effort is normal. No respiratory distress.     Breath sounds: No wheezing.     Comments: Decreased breath sound bilaterally  Abdominal:     General: Bowel sounds are normal. There is no distension.     Palpations: Abdomen is soft.  Musculoskeletal:        General: No deformity. Normal range of motion.     Cervical back: Normal range of motion and neck supple.     Comments: Trace edema bilaterally  Skin:    General: Skin is warm and dry.     Comments: Posterior neck skin abscess   Neurological:     Mental Status: She is alert and oriented to person, place, and time. Mental status is at baseline.     Cranial Nerves: No cranial nerve deficit.     Coordination: Coordination normal.  Psychiatric:        Mood and Affect: Mood normal.      LABORATORY DATA:  I have reviewed the  data as listed    Latest Ref Rng & Units 11/16/2021    9:56 AM 11/04/2021    8:16 AM 10/21/2021    8:11 AM  CBC  WBC 4.0 - 10.5 K/uL 5.7  7.1  6.7   Hemoglobin 12.0 - 15.0 g/dL 10.5  10.6  10.5   Hematocrit 36.0 - 46.0 % 31.9  30.2  30.3   Platelets 150 - 400 K/uL 290  267  273       Latest Ref Rng & Units 11/16/2021    9:56 AM 11/04/2021    8:16 AM 10/21/2021    8:11 AM  CMP  Glucose 70 - 99 mg/dL 88  108  106   BUN 8 - 23 mg/dL _0 Creatinine 0.44 - 1.00 mg/dL 1.48  0.99  1.11   Sodium 135 - 145 mmol/L 133  134  132   Potassium 3.5 - 5.1 mmol/L 4.5  3.8  4.1   Chloride 98 - 111 mmol/L 104  104  100   CO2 22 - 32 mmol/L _1 Calcium 8.9 - 10.3 mg/dL 9.2  9.0  9.1   Total Protein 6.5 - 8.1 g/dL 6.7  6.5  6.6   Total Bilirubin 0.3 - 1.2 mg/dL 0.4  0.2  0.4   Alkaline Phos 38 - 126 U/L 62  66  68   AST 15 - 41 U/L _2 ALT 0 - 44 U/L 10  14  12      Iron/TIBC/Ferritin/ %Sat    Component Value Date/Time   IRON 90 05/04/2021 0929   TIBC 321 05/04/2021 0929   FERRITIN 52 05/04/2021 0929   IRONPCTSAT 28 05/04/2021 0929      RADIOGRAPHIC STUDIES: I have personally reviewed the radiological images as listed and agreed with the findings in the report. CT CHEST ABDOMEN PELVIS W CONTRAST  Result Date: 10/26/2021 CLINICAL DATA:  A 76 year old female with history of lung and cervical cancer presents for follow-up. * Tracking Code: BO * EXAM: CT CHEST, ABDOMEN, AND PELVIS WITH CONTRAST TECHNIQUE: Multidetector CT imaging of the chest, abdomen and pelvis was performed following the standard protocol during bolus administration of intravenous contrast. RADIATION DOSE REDUCTION: This exam was performed according to the departmental dose-optimization program which includes automated exposure control, adjustment of the mA and/or kV according to patient size and/or use of iterative reconstruction technique. CONTRAST:  156m OMNIPAQUE IOHEXOL 300 MG/ML  SOLN  COMPARISON:  July 25, 2021 FINDINGS: CT CHEST FINDINGS Cardiovascular: LEFT-sided Port-A-Cath terminates in the upper RIGHT atrium. Calcified and noncalcified aortic atherosclerotic plaque no pericardial effusion or thickening. Central pulmonary vasculature is normal caliber. Mediastinum/Nodes: No adenopathy in the chest. Mild thickening of the esophagus is unchanged likely relating to post treatment changes. Stable 9 mm RIGHT hilar lymph node. Lungs/Pleura: Pulmonary emphysema as before. Bandlike consolidative changes in the posterior RIGHT upper lobe appear more organized at the periphery. No focal masslike characteristics. Stable granuloma at the RIGHT lung base with calcification. No consolidation or sign of pleural effusion. Nodule in the LEFT lower lobe a 4 mm may be very slowly increasing in size over time, certainly as compared to March of 2023. (Image 92/3) Dependent pleural thickening along the posterior RIGHT chest is stable some of which could reflect post treatment changes particularly in the upper chest along the plane of post radiation changes. Small amounts material is seen in the dependent trachea and RIGHT mainstem bronchus not changed from previous imaging. Musculoskeletal: No chest wall mass, see below for full musculoskeletal details. CT ABDOMEN PELVIS FINDINGS Hepatobiliary: Lobular hepatic contours. Portal vein is patent. No pericholecystic stranding or biliary duct dilation. No suspicious hepatic lesion. Pancreas: Normal, without mass, inflammation or ductal dilatation. Spleen: Normal. Adrenals/Urinary Tract: Adrenal glands are unremarkable. Symmetric renal enhancement. No sign of hydronephrosis. No suspicious renal lesion or perinephric stranding. Stable cyst in the posterior interpolar RIGHT kidney measuring 12 x 8 mm. No dedicated imaging follow-up is recommended for this finding. Urinary bladder is grossly unremarkable. Stomach/Bowel: No perigastric stranding. No sign of small bowel  obstruction or acute colonic process. Appendix is normal. Vascular/Lymphatic: Aortic atherosclerosis. No sign of aneurysm. Smooth contour of the IVC. There is no gastrohepatic or hepatoduodenal ligament lymphadenopathy. No retroperitoneal or mesenteric lymphadenopathy. No pelvic sidewall lymphadenopathy. Atherosclerotic changes are moderate to marked both calcified and noncalcified plaque present in the abdominal aorta. Reproductive: Unremarkable by CT. Other: No ascites. Musculoskeletal: Sclerosis of the sacrum with similar pattern of sclerosis compared to prior imaging from November of 2022. Signs of sacral insufficiency fracture visible along the RIGHT and LEFT sacral ala also similar to November of 2022. No destructive bone finding. No acute bone process. Spinal degenerative changes. IMPRESSION: 1. Bandlike consolidative changes in the posterior RIGHT upper lobe appear more organized at the periphery. No focal masslike characteristics. Findings are compatible with evolving post treatment changes. Findings are favored to represent evolving post treatment changes. 2. Nodule in the LEFT lower lobe a 4 mm  may be very slowly increasing in size over time, certainly as compared to March of 2023. Continued attention on follow-up is suggested. 3. Stable 9 mm RIGHT hilar lymph node. 4. Evidence of sacral insufficiency fractures not substantially changed. 5. Pulmonary emphysema and aortic atherosclerosis. Electronically Signed   By: Zetta Bills M.D.   On: 10/26/2021 11:47

## 2021-11-16 NOTE — Assessment & Plan Note (Signed)
Recommend patient to take Vitron C daily

## 2021-11-16 NOTE — Assessment & Plan Note (Signed)
S/p I&D.  Did not tolerate Bactrim.  Recommend patient to take doxycycline 100mg  BID x 5 days. Rx sent. Side effects were discussed with her.

## 2021-11-16 NOTE — Assessment & Plan Note (Signed)
Continue tramadol every 6 hours as needed.

## 2021-11-16 NOTE — Assessment & Plan Note (Signed)
Stage III right lung adenocarcinoma,  Recurrent S/p concurrent chemotherapy and radiation- On immunotherapy durvalumab maintenance Labs reviewed and discussed with patient.  Proceed with Durvalumab treatments

## 2021-11-18 ENCOUNTER — Other Ambulatory Visit: Payer: Self-pay | Admitting: *Deleted

## 2021-11-18 ENCOUNTER — Ambulatory Visit: Payer: Medicare PPO

## 2021-11-18 ENCOUNTER — Other Ambulatory Visit: Payer: Self-pay | Admitting: Family Medicine

## 2021-11-18 ENCOUNTER — Other Ambulatory Visit: Payer: Medicare PPO

## 2021-11-18 ENCOUNTER — Ambulatory Visit: Payer: Medicare PPO | Admitting: Oncology

## 2021-11-18 MED ORDER — TRAMADOL HCL 50 MG PO TABS: 50.0000 mg | ORAL_TABLET | Freq: Two times a day (BID) | ORAL | 0 refills | Status: DC | PRN

## 2021-11-18 NOTE — Telephone Encounter (Signed)
Requested medication (s) are due for refill today: yes (Mail order)  Requested medication (s) are on the active medication list: yes  Last refill:  02/11/21 #12 with 3 RF  Future visit scheduled: no, last seen for reg appt 03/25/21, see recently for sick visit.  Notes to clinic:  Failed protocol of labs within 12 months, No Mg+ or Phos on record, no upcoming appt, please assess.       Requested Prescriptions  Pending Prescriptions Disp Refills   alendronate (FOSAMAX) 70 MG tablet [Pharmacy Med Name: ALENDRONATE SODIUM 70 MG Tablet] 12 tablet 10    Sig: TAKE 1 TABLET EVERY 7 DAYS . TAKE WITH A FULL GLASS OF WATER ON AN EMPTY STOMACH.     Endocrinology:  Bisphosphonates Failed - 11/18/2021 12:41 PM      Failed - Vitamin D in normal range and within 360 days    No results found for: "OZ3086VH8", "IO9629BM8", "UX324MW1UUV", "25OHVITD3", "25OHVITD2", "25OHVITD1", "VD25OH"       Failed - Cr in normal range and within 360 days    Creatinine  Date Value Ref Range Status  05/14/2014 0.78 mg/dL Final    Comment:    0.44-1.00 NOTE: New Reference Range  04/07/14    Creatinine, Ser  Date Value Ref Range Status  11/16/2021 1.48 (H) 0.44 - 1.00 mg/dL Final         Failed - Mg Level in normal range and within 360 days    No results found for: "MG"       Failed - Phosphate in normal range and within 360 days    No results found for: "PHOS"       Passed - Ca in normal range and within 360 days    Calcium  Date Value Ref Range Status  11/16/2021 9.2 8.9 - 10.3 mg/dL Final   Calcium, Total  Date Value Ref Range Status  05/14/2014 8.7 (L) mg/dL Final    Comment:    8.9-10.3 NOTE: New Reference Range  04/07/14          Passed - eGFR is 30 or above and within 360 days    EGFR (African American)  Date Value Ref Range Status  05/14/2014 >60  Final   GFR calc Af Amer  Date Value Ref Range Status  11/14/2019 64 >59 mL/min/1.73 Final    Comment:    **In accordance with  recommendations from the NKF-ASN Task force,**   [facility] is in the process of updating its eGFR calculation to the   2021 CKD-EPI creatinine equation that estimates kidney function   without a race variable.    EGFR (Non-African Amer.)  Date Value Ref Range Status  05/14/2014 >60  Final    Comment:    eGFR values <58m/min/1.73 m2 may be an indication of chronic kidney disease (CKD). Calculated eGFR is useful in patients with stable renal function. The eGFR calculation will not be reliable in acutely ill patients when serum creatinine is changing rapidly. It is not useful in patients on dialysis. The eGFR calculation may not be applicable to patients at the low and high extremes of body sizes, pregnant women, and vegetarians.    GFR, Estimated  Date Value Ref Range Status  11/16/2021 37 (L) >60 mL/min Final    Comment:    (NOTE) Calculated using the CKD-EPI Creatinine Equation (2021)    eGFR  Date Value Ref Range Status  03/25/2021 61 >59 mL/min/1.73 Final         Passed -  Valid encounter within last 12 months    Recent Outpatient Visits           2 weeks ago Cutaneous abscess of neck   [facility] Mecum, Erin E, PA-C   7 months ago LUQ pain   [employer], Megan P, DO   10 months ago Essential hypertension   [place], [place], DO   1 year ago Essential hypertension   [place], [place], DO   1 year ago Grief   [employer], Megan P, DO              Passed - Bone Mineral Density or Dexa Scan completed in the last 2 years      Signed Prescriptions Disp Refills   clopidogrel (PLAVIX) 75 MG tablet 90 tablet 1    Sig: TAKE 1 TABLET EVERY DAY     Hematology: Antiplatelets - clopidogrel Failed - 11/18/2021 12:41 PM      Failed - HCT in normal range and within 180 days    HCT  Date Value Ref Range Status  11/16/2021 31.9 (L) 36.0 - 46.0 % Final   Hematocrit   Date Value Ref Range Status  03/25/2021 26.8 (L) 34.0 - 46.6 % Final         Failed - HGB in normal range and within 180 days    Hemoglobin  Date Value Ref Range Status  11/16/2021 10.5 (L) 12.0 - 15.0 g/dL Final  03/25/2021 9.3 (L) 11.1 - 15.9 g/dL Final         Failed - Cr in normal range and within 360 days    Creatinine  Date Value Ref Range Status  05/14/2014 0.78 mg/dL Final    Comment:    0.44-1.00 NOTE: New Reference Range  04/07/14    Creatinine, Ser  Date Value Ref Range Status  11/16/2021 1.48 (H) 0.44 - 1.00 mg/dL Final         Passed - PLT in normal range and within 180 days    Platelets  Date Value Ref Range Status  11/16/2021 290 150 - 400 K/uL Final  03/25/2021 326 150 - 450 x10E3/uL Final         Passed - Valid encounter within last 6 months    Recent Outpatient Visits           2 weeks ago Cutaneous abscess of neck   [facility] Mecum, Erin E, PA-C   7 months ago LUQ pain   [employer], [place], DO   10 months ago Essential hypertension   [place], [place], DO   1 year ago Essential hypertension   [place], [place], DO   1 year ago Grief   [employer], Megan P, DO               gabapentin (NEURONTIN) 100 MG capsule 270 capsule 3    Sig: TAKE 1 CAPSULE THREE TIMES DAILY     Neurology: Anticonvulsants - gabapentin Failed - 11/18/2021 12:41 PM      Failed - Cr in normal range and within 360 days    Creatinine  Date Value Ref Range Status  05/14/2014 0.78 mg/dL Final    Comment:    0.44-1.00 NOTE: New Reference Range  04/07/14    Creatinine, Ser  Date Value Ref Range Status  11/16/2021 1.48 (H) 0.44 - 1.00 mg/dL Final  Passed - Completed PHQ-2 or PHQ-9 in the last 360 days      Passed - Valid encounter within last 12 months    Recent Outpatient Visits           2 weeks ago Cutaneous abscess of neck   [facility] Mecum, Erin E, PA-C   7 months ago LUQ pain   [employer], [place], DO   10 months ago Essential hypertension   [place], [place], DO   1 year ago Essential hypertension   [place], [place], DO   1 year ago Grief   [employer], Megan P, DO              Refused Prescriptions Disp Refills   naproxen (NAPROSYN) 500 MG tablet [Pharmacy Med Name: NAPROXEN 500 MG Tablet] 180 tablet 10    Sig: TAKE 1 TABLET TWICE DAILY WITH MEALS     Analgesics:  NSAIDS Failed - 11/18/2021 12:41 PM      Failed - Manual Review: Labs are only required if the patient has taken medication for more than 8 weeks.      Failed - Cr in normal range and within 360 days    Creatinine  Date Value Ref Range Status  05/14/2014 0.78 mg/dL Final    Comment:    0.44-1.00 NOTE: New Reference Range  04/07/14    Creatinine, Ser  Date Value Ref Range Status  11/16/2021 1.48 (H) 0.44 - 1.00 mg/dL Final         Failed - HGB in normal range and within 360 days    Hemoglobin  Date Value Ref Range Status  11/16/2021 10.5 (L) 12.0 - 15.0 g/dL Final  03/25/2021 9.3 (L) 11.1 - 15.9 g/dL Final         Failed - HCT in normal range and within 360 days    HCT  Date Value Ref Range Status  11/16/2021 31.9 (L) 36.0 - 46.0 % Final   Hematocrit  Date Value Ref Range Status  03/25/2021 26.8 (L) 34.0 - 46.6 % Final         Passed - PLT in normal range and within 360 days    Platelets  Date Value Ref Range Status  11/16/2021 290 150 - 400 K/uL Final  03/25/2021 326 150 - 450 x10E3/uL Final         Passed - eGFR is 30 or above and within 360 days    EGFR (African American)  Date Value Ref Range Status  05/14/2014 >60  Final   GFR calc Af Amer  Date Value Ref Range Status  11/14/2019 64 >59 mL/min/1.73 Final    Comment:    **In accordance with recommendations from the NKF-ASN Task force,**   [facility] is in  the process of updating its eGFR calculation to the   2021 CKD-EPI creatinine equation that estimates kidney function   without a race variable.    EGFR (Non-African Amer.)  Date Value Ref Range Status  05/14/2014 >60  Final    Comment:    eGFR values <27m/min/1.73 m2 may be an indication of chronic kidney disease (CKD). Calculated eGFR is useful in patients with stable renal function. The eGFR calculation will not be reliable in acutely ill patients when serum creatinine is changing rapidly. It is not useful in patients on dialysis. The eGFR calculation may not be applicable to patients at the low and high extremes of body  sizes, pregnant women, and vegetarians.    GFR, Estimated  Date Value Ref Range Status  11/16/2021 37 (L) >60 mL/min Final    Comment:    (NOTE) Calculated using the CKD-EPI Creatinine Equation (2021)    eGFR  Date Value Ref Range Status  03/25/2021 61 >59 mL/min/1.73 Final         Passed - Patient is not pregnant      Passed - Valid encounter within last 12 months    Recent Outpatient Visits           2 weeks ago Cutaneous abscess of neck   [facility] Mecum, Erin E, PA-C   7 months ago LUQ pain   [employer], [place], DO   10 months ago Essential hypertension   [place], [place], DO   1 year ago Essential hypertension   [place], [place], DO   1 year ago [place], [place], DO

## 2021-11-18 NOTE — Telephone Encounter (Signed)
Requested Prescriptions  Pending Prescriptions Disp Refills  . naproxen (NAPROSYN) 500 MG tablet [Pharmacy Med Name: NAPROXEN 500 MG Tablet] 180 tablet 10    Sig: TAKE 1 TABLET TWICE DAILY WITH MEALS     Analgesics:  NSAIDS Failed - 11/18/2021 12:41 PM      Failed - Manual Review: Labs are only required if the patient has taken medication for more than 8 weeks.      Failed - Cr in normal range and within 360 days    Creatinine  Date Value Ref Range Status  05/14/2014 0.78 mg/dL Final    Comment:    0.44-1.00 NOTE: New Reference Range  04/07/14    Creatinine, Ser  Date Value Ref Range Status  11/16/2021 1.48 (H) 0.44 - 1.00 mg/dL Final         Failed - HGB in normal range and within 360 days    Hemoglobin  Date Value Ref Range Status  11/16/2021 10.5 (L) 12.0 - 15.0 g/dL Final  03/25/2021 9.3 (L) 11.1 - 15.9 g/dL Final         Failed - HCT in normal range and within 360 days    HCT  Date Value Ref Range Status  11/16/2021 31.9 (L) 36.0 - 46.0 % Final   Hematocrit  Date Value Ref Range Status  03/25/2021 26.8 (L) 34.0 - 46.6 % Final         Passed - PLT in normal range and within 360 days    Platelets  Date Value Ref Range Status  11/16/2021 290 150 - 400 K/uL Final  03/25/2021 326 150 - 450 x10E3/uL Final         Passed - eGFR is 30 or above and within 360 days    EGFR (African American)  Date Value Ref Range Status  05/14/2014 >60  Final   GFR calc Af Amer  Date Value Ref Range Status  11/14/2019 64 >59 mL/min/1.73 Final    Comment:    **In accordance with recommendations from the NKF-ASN Task force,**   [facility] is in the process of updating its eGFR calculation to the   2021 CKD-EPI creatinine equation that estimates kidney function   without a race variable.    EGFR (Non-African Amer.)  Date Value Ref Range Status  05/14/2014 >60  Final    Comment:    eGFR values <74m/min/1.73 m2 may be an indication of chronic kidney disease (CKD). Calculated  eGFR is useful in patients with stable renal function. The eGFR calculation will not be reliable in acutely ill patients when serum creatinine is changing rapidly. It is not useful in patients on dialysis. The eGFR calculation may not be applicable to patients at the low and high extremes of body sizes, pregnant women, and vegetarians.    GFR, Estimated  Date Value Ref Range Status  11/16/2021 37 (L) >60 mL/min Final    Comment:    (NOTE) Calculated using the CKD-EPI Creatinine Equation (2021)    eGFR  Date Value Ref Range Status  03/25/2021 61 >59 mL/min/1.73 Final         Passed - Patient is not pregnant      Passed - Valid encounter within last 12 months    Recent Outpatient Visits          2 weeks ago Cutaneous abscess of neck   [facility] Mecum, Erin E, PA-C   7 months ago LUQ pain   [place]  10 months ago Essential hypertension   [place], [place], DO   1 year ago Essential hypertension   [place], [place], DO   1 year ago [place], Megan P, DO             . clopidogrel (PLAVIX) 75 MG tablet [Pharmacy Med Name: CLOPIDOGREL 75 MG Tablet] 90 tablet 1    Sig: TAKE 1 TABLET EVERY DAY     Hematology: Antiplatelets - clopidogrel Failed - 11/18/2021 12:41 PM      Failed - HCT in normal range and within 180 days    HCT  Date Value Ref Range Status  11/16/2021 31.9 (L) 36.0 - 46.0 % Final   Hematocrit  Date Value Ref Range Status  03/25/2021 26.8 (L) 34.0 - 46.6 % Final         Failed - HGB in normal range and within 180 days    Hemoglobin  Date Value Ref Range Status  11/16/2021 10.5 (L) 12.0 - 15.0 g/dL Final  03/25/2021 9.3 (L) 11.1 - 15.9 g/dL Final         Failed - Cr in normal range and within 360 days    Creatinine  Date Value Ref Range Status  05/14/2014 0.78 mg/dL Final    Comment:    0.44-1.00 NOTE: New  Reference Range  04/07/14    Creatinine, Ser  Date Value Ref Range Status  11/16/2021 1.48 (H) 0.44 - 1.00 mg/dL Final         Passed - PLT in normal range and within 180 days    Platelets  Date Value Ref Range Status  11/16/2021 290 150 - 400 K/uL Final  03/25/2021 326 150 - 450 x10E3/uL Final         Passed - Valid encounter within last 6 months    Recent Outpatient Visits          2 weeks ago Cutaneous abscess of neck   [facility] Mecum, Erin E, PA-C   7 months ago LUQ pain   [employer], [place], DO   10 months ago Essential hypertension   [place], [place], DO   1 year ago Essential hypertension   [facility] [place], [place], DO   1 year ago Grief   [employer], Megan P, DO             . gabapentin (NEURONTIN) 100 MG capsule [Pharmacy Med Name: GABAPENTIN 100 MG Capsule] 270 capsule 3    Sig: TAKE 1 [place]     Neurology: Anticonvulsants - gabapentin Failed - 11/18/2021 12:41 PM      Failed - Cr in normal range and within 360 days    Creatinine  Date Value Ref Range Status  05/14/2014 0.78 mg/dL Final    Comment:    0.44-1.00 NOTE: New Reference Range  04/07/14    Creatinine, Ser  Date Value Ref Range Status  11/16/2021 1.48 (H) 0.44 - 1.00 mg/dL Final         Passed - Completed PHQ-2 or PHQ-9 in the last 360 days      Passed - Valid encounter within last 12 months    Recent Outpatient Visits          2 weeks ago Cutaneous abscess of neck   [facility] Mecum, Erin E, PA-C   7 months ago LUQ pain  [place], Megan P, DO   10 months ago Essential hypertension   [place], [place], DO   1 year ago Essential hypertension   [place], [place], DO   1 year ago Grief   [facility] [place], Megan P, DO             . alendronate (FOSAMAX) 70  MG tablet [Pharmacy Med Name: ALENDRONATE SODIUM 70 MG Tablet] 12 tablet 10    Sig: TAKE 1 TABLET EVERY 7 DAYS . TAKE WITH A FULL GLASS OF WATER ON AN EMPTY STOMACH.     Endocrinology:  Bisphosphonates Failed - 11/18/2021 12:41 PM      Failed - Vitamin D in normal range and within 360 days    No results found for: "ER1540GQ6", "PY1950DT2", "IZ124PY0DXI", "25OHVITD3", "25OHVITD2", "25OHVITD1", "VD25OH"       Failed - Cr in normal range and within 360 days    Creatinine  Date Value Ref Range Status  05/14/2014 0.78 mg/dL Final    Comment:    0.44-1.00 NOTE: New Reference Range  04/07/14    Creatinine, Ser  Date Value Ref Range Status  11/16/2021 1.48 (H) 0.44 - 1.00 mg/dL Final         Failed - Mg Level in normal range and within 360 days    No results found for: "MG"       Failed - Phosphate in normal range and within 360 days    No results found for: "PHOS"       Passed - Ca in normal range and within 360 days    Calcium  Date Value Ref Range Status  11/16/2021 9.2 8.9 - 10.3 mg/dL Final   Calcium, Total  Date Value Ref Range Status  05/14/2014 8.7 (L) mg/dL Final    Comment:    8.9-10.3 NOTE: New Reference Range  04/07/14          Passed - eGFR is 30 or above and within 360 days    EGFR (African American)  Date Value Ref Range Status  05/14/2014 >60  Final   GFR calc Af Amer  Date Value Ref Range Status  11/14/2019 64 >59 mL/min/1.73 Final    Comment:    **In accordance with recommendations from the NKF-ASN Task force,**   [facility] is in the process of updating its eGFR calculation to the   2021 CKD-EPI creatinine equation that estimates kidney function   without a race variable.    EGFR (Non-African Amer.)  Date Value Ref Range Status  05/14/2014 >60  Final    Comment:    eGFR values <31m/min/1.73 m2 may be an indication of chronic kidney disease (CKD). Calculated eGFR is useful in patients with stable renal function. The eGFR calculation will not  be reliable in acutely ill patients when serum creatinine is changing rapidly. It is not useful in patients on dialysis. The eGFR calculation may not be applicable to patients at the low and high extremes of body sizes, pregnant women, and vegetarians.    GFR, Estimated  Date Value Ref Range Status  11/16/2021 37 (L) >60 mL/min Final    Comment:    (NOTE) Calculated using the CKD-EPI Creatinine Equation (2021)    eGFR  Date Value Ref Range Status  03/25/2021 61 >59 mL/min/1.73 Final         Passed - Valid encounter within last 12 months    Recent Outpatient Visits  2 weeks ago Cutaneous abscess of neck   [facility] Mecum, Erin E, PA-C   7 months ago LUQ pain   [place], [place], DO   10 months ago Essential hypertension   [place], [place], DO   1 year ago Essential hypertension   [place], [place], DO   1 year ago Grief   [facility] [place], [place], DO             Passed - Bone Mineral Density or Dexa Scan completed in the last 2 years

## 2021-11-30 ENCOUNTER — Inpatient Hospital Stay (HOSPITAL_BASED_OUTPATIENT_CLINIC_OR_DEPARTMENT_OTHER): Payer: Medicare PPO | Admitting: Oncology

## 2021-11-30 ENCOUNTER — Inpatient Hospital Stay: Payer: Medicare PPO | Attending: Oncology

## 2021-11-30 ENCOUNTER — Encounter: Payer: Self-pay | Admitting: Oncology

## 2021-11-30 ENCOUNTER — Inpatient Hospital Stay: Payer: Medicare PPO

## 2021-11-30 VITALS — BP 134/56 | HR 76 | Temp 98.8°F | Wt 134.5 lb

## 2021-11-30 DIAGNOSIS — G8929 Other chronic pain: Secondary | ICD-10-CM

## 2021-11-30 DIAGNOSIS — D631 Anemia in chronic kidney disease: Secondary | ICD-10-CM

## 2021-11-30 DIAGNOSIS — J439 Emphysema, unspecified: Secondary | ICD-10-CM | POA: Insufficient documentation

## 2021-11-30 DIAGNOSIS — C3491 Malignant neoplasm of unspecified part of right bronchus or lung: Secondary | ICD-10-CM | POA: Diagnosis not present

## 2021-11-30 DIAGNOSIS — N183 Chronic kidney disease, stage 3 unspecified: Secondary | ICD-10-CM | POA: Insufficient documentation

## 2021-11-30 DIAGNOSIS — Z8541 Personal history of malignant neoplasm of cervix uteri: Secondary | ICD-10-CM | POA: Insufficient documentation

## 2021-11-30 DIAGNOSIS — E039 Hypothyroidism, unspecified: Secondary | ICD-10-CM | POA: Insufficient documentation

## 2021-11-30 DIAGNOSIS — K219 Gastro-esophageal reflux disease without esophagitis: Secondary | ICD-10-CM | POA: Insufficient documentation

## 2021-11-30 DIAGNOSIS — F17210 Nicotine dependence, cigarettes, uncomplicated: Secondary | ICD-10-CM | POA: Insufficient documentation

## 2021-11-30 DIAGNOSIS — M545 Low back pain, unspecified: Secondary | ICD-10-CM

## 2021-11-30 DIAGNOSIS — E785 Hyperlipidemia, unspecified: Secondary | ICD-10-CM | POA: Insufficient documentation

## 2021-11-30 DIAGNOSIS — I129 Hypertensive chronic kidney disease with stage 1 through stage 4 chronic kidney disease, or unspecified chronic kidney disease: Secondary | ICD-10-CM | POA: Diagnosis not present

## 2021-11-30 DIAGNOSIS — Z79899 Other long term (current) drug therapy: Secondary | ICD-10-CM | POA: Insufficient documentation

## 2021-11-30 DIAGNOSIS — Z5112 Encounter for antineoplastic immunotherapy: Secondary | ICD-10-CM | POA: Insufficient documentation

## 2021-11-30 LAB — CBC WITH DIFFERENTIAL/PLATELET
Abs Immature Granulocytes: 0.02 10*3/uL (ref 0.00–0.07)
Basophils Absolute: 0 10*3/uL (ref 0.0–0.1)
Basophils Relative: 1 %
Eosinophils Absolute: 0.1 10*3/uL (ref 0.0–0.5)
Eosinophils Relative: 2 %
HCT: 30.2 % — ABNORMAL LOW (ref 36.0–46.0)
Hemoglobin: 10.2 g/dL — ABNORMAL LOW (ref 12.0–15.0)
Immature Granulocytes: 0 %
Lymphocytes Relative: 8 %
Lymphs Abs: 0.5 10*3/uL — ABNORMAL LOW (ref 0.7–4.0)
MCH: 32.8 pg (ref 26.0–34.0)
MCHC: 33.8 g/dL (ref 30.0–36.0)
MCV: 97.1 fL (ref 80.0–100.0)
Monocytes Absolute: 0.5 10*3/uL (ref 0.1–1.0)
Monocytes Relative: 8 %
Neutro Abs: 4.5 10*3/uL (ref 1.7–7.7)
Neutrophils Relative %: 81 %
Platelets: 229 10*3/uL (ref 150–400)
RBC: 3.11 MIL/uL — ABNORMAL LOW (ref 3.87–5.11)
RDW: 12.8 % (ref 11.5–15.5)
WBC: 5.6 10*3/uL (ref 4.0–10.5)
nRBC: 0 % (ref 0.0–0.2)

## 2021-11-30 LAB — TSH: TSH: 8.381 u[IU]/mL — ABNORMAL HIGH (ref 0.350–4.500)

## 2021-11-30 LAB — COMPREHENSIVE METABOLIC PANEL
ALT: 9 U/L (ref 0–44)
AST: 19 U/L (ref 15–41)
Albumin: 3.3 g/dL — ABNORMAL LOW (ref 3.5–5.0)
Alkaline Phosphatase: 68 U/L (ref 38–126)
Anion gap: 5 (ref 5–15)
BUN: 9 mg/dL (ref 8–23)
CO2: 23 mmol/L (ref 22–32)
Calcium: 8.5 mg/dL — ABNORMAL LOW (ref 8.9–10.3)
Chloride: 107 mmol/L (ref 98–111)
Creatinine, Ser: 0.83 mg/dL (ref 0.44–1.00)
GFR, Estimated: >60 mL/min (ref >60)
Glucose, Bld: 108 mg/dL — ABNORMAL HIGH (ref 70–99)
Potassium: 3.8 mmol/L (ref 3.5–5.1)
Sodium: 135 mmol/L (ref 135–145)
Total Bilirubin: 0.4 mg/dL (ref 0.3–1.2)
Total Protein: 6.3 g/dL — ABNORMAL LOW (ref 6.5–8.1)

## 2021-11-30 LAB — T4, FREE: Free T4: 0.91 ng/dL (ref 0.61–1.12)

## 2021-11-30 MED ORDER — SODIUM CHLORIDE 0.9 % IV SOLN
10.0000 mg/kg | Freq: Once | INTRAVENOUS | Status: AC
  Administered 2021-11-30: 620 mg via INTRAVENOUS
  Filled 2021-11-30: qty 10

## 2021-11-30 MED ORDER — HEPARIN SOD (PORK) LOCK FLUSH 100 UNIT/ML IV SOLN
500.0000 [IU] | Freq: Once | INTRAVENOUS | Status: AC | PRN
  Administered 2021-11-30: 500 [IU]
  Filled 2021-11-30: qty 5

## 2021-11-30 MED ORDER — SODIUM CHLORIDE 0.9% FLUSH
10.0000 mL | Freq: Once | INTRAVENOUS | Status: AC
  Administered 2021-11-30: 10 mL via INTRAVENOUS
  Filled 2021-11-30: qty 10

## 2021-11-30 MED ORDER — SODIUM CHLORIDE 0.9 % IV SOLN
Freq: Once | INTRAVENOUS | Status: AC
  Filled 2021-11-30: qty 250

## 2021-11-30 NOTE — Assessment & Plan Note (Signed)
Stage III right lung adenocarcinoma,  Recurrent S/p concurrent chemotherapy and radiation- On immunotherapy durvalumab maintenance Labs reviewed and discussed with patient.  Proceed with Durvalumab treatments

## 2021-11-30 NOTE — Assessment & Plan Note (Signed)
Recommend patient to take Vitron C daily

## 2021-11-30 NOTE — Assessment & Plan Note (Signed)
Treatment plan was listed above

## 2021-11-30 NOTE — Progress Notes (Signed)
Hematology/Oncology Progress note [phone] [phone]      Patient Care Team: [name], DO as PCP - General (Family Medicine) Clent Jacks, RN as Oncology Nurse Navigator Noreene Filbert, MD as Radiation Oncologist (Radiation Oncology) Telford Nab, RN as Oncology Nurse Navigator [name], MD as Consulting Physician (Oncology)  ASSESSMENT & PLAN:   Cancer Staging  Cervical cancer [facility]) Staging form: Cervix Uteri, AJCC Version 9 - Clinical: FIGO Stage IIICr (cTX, cN1) - Unsigned  Primary lung adenocarcinoma ([place]) Staging form: Lung, AJCC 7th Edition - Clinical stage from 01/12/2021: T1, N1, M0 - Signed by [name], MD on 01/29/2021   Primary lung adenocarcinoma ([place]) Stage III right lung adenocarcinoma,  Recurrent S/p concurrent chemotherapy and radiation- On immunotherapy durvalumab maintenance Labs reviewed and discussed with patient.  Proceed with Durvalumab treatments   Encounter for antineoplastic immunotherapy Treatment plan was listed above  Back pain Continue tramadol every 6 hours as needed.    Anemia in chronic kidney disease (CODE) Recommend patient to take Vitron C daily  Orders Placed This Encounter  Procedures   CT Chest W Contrast    Standing Status:   Future    Standing Expiration Date:   11/30/2022    Order Specific Question:   If indicated for the ordered procedure, I authorize the administration of contrast media per Radiology protocol    Answer:   Yes    Order Specific Question:   Preferred imaging location?    Answer:   [facility]   CBC with Differential    Standing Status:   Future    Standing Expiration Date:   01/12/2023   Comprehensive metabolic panel    Standing Status:   Future    Standing Expiration Date:   01/12/2023   T4, free    Standing Status:   Future    Standing Expiration Date:   01/26/2023   CBC with Differential    Standing Status:   Future    Standing Expiration Date:    01/26/2023   Comprehensive metabolic panel    Standing Status:   Future    Standing Expiration Date:   01/26/2023   TSH    Standing Status:   Future    Standing Expiration Date:   01/26/2023   Follow up in 2 weeks.   All questions were answered. The patient knows to call the clinic with any problems, questions or concerns.  [name], MD, PhD [facility] Hematology Oncology 11/30/2021     CHIEF COMPLAINTS/REASON FOR VISIT:  Follow up lung cancer treatments  HISTORY OF PRESENTING ILLNESS:  # Cervix cancer Patient has developed postmenopausal vaginal bleeding and discharge.  She was noted to have a friable cervix/2 cm mass of the cervix, concerning for malignancy 12/19/2018 endometrial biopsy showed scattered atypical squamous cells suspicious for malignancy.  Predominantly necrosis with associated inflammation. 01/01/2019 initial cervix biopsy showed at least high-grade squamous intraepithelial lesion.-HPV negative 01/22/2019, repeat vaginal wall biopsy and cervix 9:00 biopsy showed squamous cell carcinoma.    Staging images 12/31/2018 showed fluid distending the endometrial canal and or endometrial thickening to the level of cervix.  No evidence of pathological lymphadenopathy.  Haziness about the lower cervix.  7 mm irregular pulmonary nodule within the right upper lobe, suspicious for possible primary or metastatic malignancy.  Chronic pleural-parenchymal scarring/fibrosis at the lung apices.  Large amount of stool.  No bowel obstruction. 01/08/2019 PET scan showed marked hypermetabolism in the region of the cervix, compatible with the reported  history of cervical cancer. Small bilateral pelvic sidewall lymph nodes discernible FDG accumulation, concerning for metastatic disease although neither lymph node is enlarged by CT size criteria. 7 mm nodule in the right upper lobe shows discernible FDG accumulation.  Questionable neoplasm, primary versus metastatic. Emphysema.   #Chronic  hearing loss  # Lung nodule, FDG avid, questionable primary bronchogenic carcinoma versus metastatic cancer.Discussed with radiation oncology.  Her case was also discussed on tumor board.  Consensus reached on finishing concurrent chemoradiation treatments for cervix cancer first. Possible SBRT to lung nodule for presumed primary lung cancer.  # 02/20/2020 - 03/19/2020 concurrent chemotherapy weekly carboplatin and taxol and radiation for treatment of this cervix cancer.   #December 2021 lung nodule,was evaluated by Dr.Oaks. Options of biopsy via transthoracic or endobronchial approach vs surgical resection.  Patient opted to empiric SBRT  Patient was last seen by me on 06/25/2019.  Lost follow-up. She continues to follow with radiation oncology. 11/20/2020, CT chest with contrast showed minimal residual of previously seen right upper lobe nodule.  0.6 x 0.4 cm.  Internal development of mild bandlike radiation fibrosis.  Newly enlarged pretracheal lymph node, measuring 2.5 x 1.7 cm.  Highly concerning for metastatic disease.  Unchanged prominent AP window and the right hilar lymph nodes.  Emphysema and diffuse bilateral bronchial wall thickening.  Coronary artery disease. 12/13/2020, PET scan showed enlarging hypermetabolic right lower paratracheal lymph node, compatible with metastatic disease.  SUV 10.3.  Mild FDG uptake of the bilateral sacral alla with associated lucency.  Concerning for sacral insufficiency fracture.  Stable posttreatment changes of the right upper lobe nodule.  Nonspecific small solid 3 mm pulmonary nodule of the left lower lobe.  2 small to be characterized.  No evidence of FDG avid metastatic disease in the abdomen or pelvis.  Aortic atherosclerosis and emphysema.  Patient reestablish care on 12/27/2020 for lung cancer treatments   #01/12/2021 patient underwent bronchoscopy biopsy by Dr. Patsey Berthold Right paratracheal lymph node fine-needle aspiration is positive for metastatic  non-small cell carcinoma, favor adenocarcinoma of lung origin.  Circulogen NGS negative PD-L1, ALK, ROS1, NTRK1/2/3,   # 02/14/2021-03/31/2021, concurrent carboplatin AUC 2 and taxol 43m/m2 with radiation.  04/26/2021, CT chest with contrast showed evolving postradiation changes in the posterior perifissural upper right lung without discrete residual measurable nodule in this location.  Right hilar/mediastinal lymphadenopathy stable to decreased.  Tiny 0.2 cm left lower lobe pulmonary nodule slightly decreased.  Positive response to treatment.  No new or progressive disease.  Three-vessel coronary arthrosclerosis.  Aortic atherosclerosis/emphysema.  INTERVAL HISTORY Mary DUDENHOEFFERis a 76y.o. female who has above history reviewed by me today presents for follow up visit for management of possible recurrent lung cancer.  History of cervix cancer.    No new complaints. She tolerates immunotherapy well.  She has noticed intermittent nausea, she threw up 1 or 2 times after last treatment.  She attributes to the doxycycline and/or  pain medication.  Skin abscess status post I&D, she finished 4 days course of doxycycline.  Improved symptoms.    Review of Systems  Constitutional:  Positive for fatigue. Negative for appetite change, chills, fever and unexpected weight change.  HENT:   Positive for hearing loss. Negative for voice change.   Eyes:  Negative for eye problems.  Respiratory:  Negative for chest tightness and cough.   Cardiovascular:  Negative for chest pain.  Gastrointestinal:  Negative for abdominal distention, abdominal pain and blood in stool.  Endocrine: Negative for hot  flashes.  Genitourinary:  Negative for difficulty urinating and frequency.   Musculoskeletal:  Negative for arthralgias.  Skin:  Negative for itching and rash.  Neurological:  Negative for extremity weakness.  Hematological:  Negative for adenopathy.  Psychiatric/Behavioral:  Negative for confusion.      MEDICAL HISTORY:  Past Medical History:  Diagnosis Date   Arthritis    Cancer ([place])    Carotid atherosclerosis    Carotid bruit    Cervical cancer (HCC)    COPD (chronic obstructive pulmonary disease) (HCC)    emphysema   Coronary artery disease    Family history of adverse reaction to anesthesia    paternal grandfather died during surgery-pt unaware what happened   GERD (gastroesophageal reflux disease)    Hyperlipidemia    Hypertension    Hyponatremia 02/27/2019   Hypothyroidism    Infected cat bite 1980s   was hospitalized   Irregular heartbeat    Medical history non-contributory    Peripheral vascular disease ([place])    Tobacco use     SURGICAL HISTORY: Past Surgical History:  Procedure Laterality Date   CAROTID ENDARTERECTOMY Right Oct. 2015   Dr. Lucky Cowboy   CAROTID STENOSIS Left April 2016   carotid stenosis surgery   COLONOSCOPY WITH PROPOFOL N/A 11/08/2016   Procedure: COLONOSCOPY WITH PROPOFOL;  Surgeon: Jonathon Bellows, MD;  Location: [facility] ENDOSCOPY;  Service: Gastroenterology;  Laterality: N/A;   ESOPHAGOGASTRODUODENOSCOPY (EGD) WITH PROPOFOL N/A 11/08/2016   Procedure: ESOPHAGOGASTRODUODENOSCOPY (EGD) WITH PROPOFOL;  Surgeon: Jonathon Bellows, MD;  Location: [facility] ENDOSCOPY;  Service: Gastroenterology;  Laterality: N/A;   FLEXIBLE BRONCHOSCOPY N/A 12/17/2014   Procedure: FLEXIBLE BRONCHOSCOPY;  Surgeon: Wilhelmina Mcardle, MD;  Location: [facility] ORS;  Service: Pulmonary;  Laterality: N/A;   INCISION AND DRAINAGE / EXCISION THYROGLOSSAL CYST  April 2011   PORTA CATH INSERTION N/A 03/03/2021   Procedure: PORTA CATH INSERTION;  Surgeon: Algernon Huxley, MD;  Location: [place] [facility] LAB;  Service: Cardiovascular;  Laterality: N/A;   VIDEO BRONCHOSCOPY WITH ENDOBRONCHIAL ULTRASOUND N/A 01/12/2021   Procedure: VIDEO BRONCHOSCOPY WITH ENDOBRONCHIAL ULTRASOUND;  Surgeon: Tyler Pita, MD;  Location: [facility] ORS;  Service: Cardiopulmonary;  Laterality: N/A;    SOCIAL HISTORY: Social  History   Socioeconomic History   Marital status: Married    Spouse name: Deidre Ala   Number of children: Not on file   Years of education: Not on file   Highest education level: 9th grade  Occupational History   Occupation: retired   Tobacco Use   Smoking status: Every Day    Packs/day: 0.50    Years: 50.00    Total pack years: 25.00    Types: Cigarettes   Smokeless tobacco: Never   Tobacco comments:    0.5 PPD 02/10/2021  Vaping Use   Vaping Use: Former  Substance and Sexual Activity   Alcohol use: No    Alcohol/week: 0.0 standard drinks of alcohol   Drug use: No   Sexual activity: Yes  Other Topics Concern   Not on file  Social History Narrative   Lives at home with husband   Social Determinants of Health   Financial Resource Strain: Low Risk  (03/15/2021)   Overall Financial Resource Strain (CARDIA)    Difficulty of Paying Living Expenses: Not hard at all  Food Insecurity: No Food Insecurity (03/15/2021)   Hunger Vital Sign    Worried About Running Out of Food in the Last Year: Never true    [place] in the  Last Year: Never true  Transportation Needs: No Transportation Needs (03/15/2021)   PRAPARE - Hydrologist (Medical): No    Lack of Transportation (Non-Medical): No  Physical Activity: Inactive (03/15/2021)   Exercise Vital Sign    Days of Exercise per Week: 0 days    Minutes of Exercise per Session: 0 min  Stress: No Stress Concern Present (03/15/2021)   [place]    Feeling of Stress : Not at all  Social Connections: Moderately Isolated (03/15/2021)   Social Connection and Isolation Panel [NHANES]    Frequency of Communication with Friends and Family: More than three times a week    Frequency of Social Gatherings with Friends and Family: More than three times a week    Attends Religious Services: Never    Marine scientist or Organizations: No    Attends  Archivist Meetings: Never    Marital Status: Married  Human resources officer Violence: Not At Risk (03/15/2021)   Humiliation, Afraid, Rape, and Kick questionnaire    Fear of Current or Ex-Partner: No    Emotionally Abused: No    Physically Abused: No    Sexually Abused: No    FAMILY HISTORY: Family History  Problem Relation Age of Onset   Congestive Heart Failure Mother    Heart disease Mother    Hypertension Mother    Hypertension Sister    Diabetes Sister    Heart disease Sister    Heart disease Maternal Uncle    Heart disease Maternal Grandmother    Stroke Maternal Grandfather    Cancer Brother        liver, lung   Heart disease Brother    Hypertension Brother    Heart attack Brother    COPD Neg Hx    Breast cancer Neg Hx     ALLERGIES:  is allergic to atorvastatin.  MEDICATIONS:  Current Outpatient Medications  Medication Sig Dispense Refill   acetaminophen (TYLENOL) 500 MG tablet Take 500 mg by mouth every 6 (six) hours as needed for moderate pain.     alendronate (FOSAMAX) 70 MG tablet Take 1 tablet (70 mg total) by mouth every 7 (seven) days. Take with a full glass of water on an empty stomach. 12 tablet 3   chlorhexidine (PERIDEX) 0.12 % solution Use as directed 5 mLs in the mouth or throat 2 (two) times daily. 120 mL 0   clopidogrel (PLAVIX) 75 MG tablet TAKE 1 TABLET EVERY DAY 90 tablet 1   cyclobenzaprine (FLEXERIL) 10 MG tablet TAKE 1 TABLET AT BEDTIME 90 tablet 1   Fluticasone-Umeclidin-Vilant (TRELEGY ELLIPTA) 100-62.5-25 MCG/INH AEPB Inhale 1 puff into the lungs daily. 1 each 11   gabapentin (NEURONTIN) 100 MG capsule TAKE 1 CAPSULE THREE TIMES DAILY 270 capsule 3   Iron-Vitamin C 65-125 MG TABS Take 1 tablet by mouth daily. 30 tablet 3   levothyroxine (SYNTHROID) 88 MCG tablet Take 1 tablet (88 mcg total) by mouth daily. 90 tablet 3   lidocaine (LIDODERM) 5 % Place 1 patch onto the skin daily. Remove & Discard patch within 12 hours or as directed by  MD 90 patch 1   lidocaine-prilocaine (EMLA) cream Apply 1 application topically as needed. 60 g 3   lisinopril (ZESTRIL) 20 MG tablet TAKE 1 TABLET EVERY DAY 90 tablet 0   Multiple Vitamin (MULTIVITAMIN) tablet Take 1 tablet by mouth daily.     omeprazole (PRILOSEC) 20 MG  capsule TAKE 1 CAPSULE EVERY DAY 90 capsule 2   ondansetron (ZOFRAN) 8 MG tablet Take 1 tablet (8 mg total) by mouth 2 (two) times daily as needed for refractory nausea / vomiting. Start on day 3 after chemo. 30 tablet 1   traMADol (ULTRAM) 50 MG tablet Take 1-2 tablets (50-100 mg total) by mouth every 12 (twelve) hours as needed. 60 tablet 0   vitamin B-12 (CYANOCOBALAMIN) 500 MCG tablet Take 500 mcg by mouth daily.     No current facility-administered medications for this visit.     PHYSICAL EXAMINATION: ECOG PERFORMANCE STATUS: 1 - Symptomatic but completely ambulatory Vitals:   11/30/21 0925  BP: (!) 134/56  Pulse: 76  Temp: 98.8 F (37.1 C)  SpO2: 99%    Filed Weights   11/30/21 0925  Weight: 134 lb 8 oz (61 kg)     Physical Exam Constitutional:      General: She is not in acute distress. HENT:     Head: Normocephalic and atraumatic.  Eyes:     General: No scleral icterus.    Pupils: Pupils are equal, round, and reactive to light.  Cardiovascular:     Rate and Rhythm: Normal rate.  Pulmonary:     Effort: Pulmonary effort is normal. No respiratory distress.     Breath sounds: No wheezing.     Comments: Decreased breath sound bilaterally  Abdominal:     General: Bowel sounds are normal. There is no distension.     Palpations: Abdomen is soft.  Musculoskeletal:        General: No deformity. Normal range of motion.     Cervical back: Normal range of motion and neck supple.     Comments: Trace edema bilaterally  Skin:    General: Skin is warm and dry.  Neurological:     Mental Status: She is alert and oriented to person, place, and time. Mental status is at baseline.     Cranial Nerves: No  cranial nerve deficit.     Coordination: Coordination normal.  Psychiatric:        Mood and Affect: Mood normal.      LABORATORY DATA:  I have reviewed the data as listed    Latest Ref Rng & Units 11/30/2021    9:15 AM 11/16/2021    9:56 AM 11/04/2021    8:16 AM  CBC  WBC 4.0 - 10.5 K/uL 5.6  5.7  7.1   Hemoglobin 12.0 - 15.0 g/dL 10.2  10.5  10.6   Hematocrit 36.0 - 46.0 % 30.2  31.9  30.2   Platelets 150 - 400 K/uL 229  290  267       Latest Ref Rng & Units 11/30/2021    9:15 AM 11/16/2021    9:56 AM 11/04/2021    8:16 AM  CMP  Glucose 70 - 99 mg/dL 108  88  108   BUN 8 - 23 mg/dL _0 Creatinine 0.44 - 1.00 mg/dL 0.83  1.48  0.99   Sodium 135 - 145 mmol/L 135  133  134   Potassium 3.5 - 5.1 mmol/L 3.8  4.5  3.8   Chloride 98 - 111 mmol/L 107  104  104   CO2 22 - 32 mmol/L _1 Calcium 8.9 - 10.3 mg/dL 8.5  9.2  9.0   Total Protein 6.5 - 8.1 g/dL 6.3  6.7  6.5   Total Bilirubin 0.3 - 1.2  mg/dL 0.4  0.4  0.2   Alkaline Phos 38 - 126 U/L 68  62  66   AST 15 - 41 U/L _0 ALT 0 - 44 U/L _1 Iron/TIBC/Ferritin/ %Sat    Component Value Date/Time   IRON 90 05/04/2021 0929   TIBC 321 05/04/2021 0929   FERRITIN 52 05/04/2021 0929   IRONPCTSAT 28 05/04/2021 0929      RADIOGRAPHIC STUDIES: I have personally reviewed the radiological images as listed and agreed with the findings in the report. CT CHEST ABDOMEN PELVIS W CONTRAST  Result Date: 10/26/2021 CLINICAL DATA:  A 76 year old female with history of lung and cervical cancer presents for follow-up. * Tracking Code: BO * EXAM: CT CHEST, ABDOMEN, AND PELVIS WITH CONTRAST TECHNIQUE: Multidetector CT imaging of the chest, abdomen and pelvis was performed following the standard protocol during bolus administration of intravenous contrast. RADIATION DOSE REDUCTION: This exam was performed according to the departmental dose-optimization program which includes automated exposure control,  adjustment of the mA and/or kV according to patient size and/or use of iterative reconstruction technique. CONTRAST:  159m OMNIPAQUE IOHEXOL 300 MG/ML  SOLN COMPARISON:  July 25, 2021 FINDINGS: CT CHEST FINDINGS Cardiovascular: LEFT-sided Port-A-Cath terminates in the upper RIGHT atrium. Calcified and noncalcified aortic atherosclerotic plaque no pericardial effusion or thickening. Central pulmonary vasculature is normal caliber. Mediastinum/Nodes: No adenopathy in the chest. Mild thickening of the esophagus is unchanged likely relating to post treatment changes. Stable 9 mm RIGHT hilar lymph node. Lungs/Pleura: Pulmonary emphysema as before. Bandlike consolidative changes in the posterior RIGHT upper lobe appear more organized at the periphery. No focal masslike characteristics. Stable granuloma at the RIGHT lung base with calcification. No consolidation or sign of pleural effusion. Nodule in the LEFT lower lobe a 4 mm may be very slowly increasing in size over time, certainly as compared to March of 2023. (Image 92/3) Dependent pleural thickening along the posterior RIGHT chest is stable some of which could reflect post treatment changes particularly in the upper chest along the plane of post radiation changes. Small amounts material is seen in the dependent trachea and RIGHT mainstem bronchus not changed from previous imaging. Musculoskeletal: No chest wall mass, see below for full musculoskeletal details. CT ABDOMEN PELVIS FINDINGS Hepatobiliary: Lobular hepatic contours. Portal vein is patent. No pericholecystic stranding or biliary duct dilation. No suspicious hepatic lesion. Pancreas: Normal, without mass, inflammation or ductal dilatation. Spleen: Normal. Adrenals/Urinary Tract: Adrenal glands are unremarkable. Symmetric renal enhancement. No sign of hydronephrosis. No suspicious renal lesion or perinephric stranding. Stable cyst in the posterior interpolar RIGHT kidney measuring 12 x 8 mm. No dedicated  imaging follow-up is recommended for this finding. Urinary bladder is grossly unremarkable. Stomach/Bowel: No perigastric stranding. No sign of small bowel obstruction or acute colonic process. Appendix is normal. Vascular/Lymphatic: Aortic atherosclerosis. No sign of aneurysm. Smooth contour of the IVC. There is no gastrohepatic or hepatoduodenal ligament lymphadenopathy. No retroperitoneal or mesenteric lymphadenopathy. No pelvic sidewall lymphadenopathy. Atherosclerotic changes are moderate to marked both calcified and noncalcified plaque present in the abdominal aorta. Reproductive: Unremarkable by CT. Other: No ascites. Musculoskeletal: Sclerosis of the sacrum with similar pattern of sclerosis compared to prior imaging from November of 2022. Signs of sacral insufficiency fracture visible along the RIGHT and LEFT sacral ala also similar to November of 2022. No destructive bone finding. No acute bone process. Spinal degenerative changes. IMPRESSION: 1. Bandlike consolidative changes in  the posterior RIGHT upper lobe appear more organized at the periphery. No focal masslike characteristics. Findings are compatible with evolving post treatment changes. Findings are favored to represent evolving post treatment changes. 2. Nodule in the LEFT lower lobe a 4 mm may be very slowly increasing in size over time, certainly as compared to March of 2023. Continued attention on follow-up is suggested. 3. Stable 9 mm RIGHT hilar lymph node. 4. Evidence of sacral insufficiency fractures not substantially changed. 5. Pulmonary emphysema and aortic atherosclerosis. Electronically Signed   By: Zetta Bills M.D.   On: 10/26/2021 11:47

## 2021-11-30 NOTE — Assessment & Plan Note (Signed)
Continue tramadol every 6 hours as needed.

## 2021-12-02 ENCOUNTER — Other Ambulatory Visit: Payer: Medicare PPO

## 2021-12-02 ENCOUNTER — Ambulatory Visit: Payer: Medicare PPO | Admitting: Oncology

## 2021-12-02 ENCOUNTER — Ambulatory Visit: Payer: Medicare PPO

## 2021-12-14 ENCOUNTER — Inpatient Hospital Stay: Payer: Medicare PPO

## 2021-12-14 ENCOUNTER — Encounter: Payer: Self-pay | Admitting: Obstetrics and Gynecology

## 2021-12-14 ENCOUNTER — Other Ambulatory Visit: Payer: Self-pay | Admitting: Nurse Practitioner

## 2021-12-14 ENCOUNTER — Encounter: Payer: Self-pay | Admitting: Oncology

## 2021-12-14 ENCOUNTER — Inpatient Hospital Stay (HOSPITAL_BASED_OUTPATIENT_CLINIC_OR_DEPARTMENT_OTHER): Payer: Medicare PPO | Admitting: Obstetrics and Gynecology

## 2021-12-14 ENCOUNTER — Inpatient Hospital Stay (HOSPITAL_BASED_OUTPATIENT_CLINIC_OR_DEPARTMENT_OTHER): Payer: Medicare PPO | Admitting: Oncology

## 2021-12-14 VITALS — BP 123/55 | HR 85 | Temp 98.6°F | Resp 18 | Wt 133.0 lb

## 2021-12-14 VITALS — BP 123/55 | HR 85 | Temp 98.6°F | Resp 18 | Wt 133.5 lb

## 2021-12-14 DIAGNOSIS — Z8541 Personal history of malignant neoplasm of cervix uteri: Secondary | ICD-10-CM | POA: Diagnosis not present

## 2021-12-14 DIAGNOSIS — D631 Anemia in chronic kidney disease: Secondary | ICD-10-CM | POA: Diagnosis not present

## 2021-12-14 DIAGNOSIS — F17210 Nicotine dependence, cigarettes, uncomplicated: Secondary | ICD-10-CM | POA: Diagnosis not present

## 2021-12-14 DIAGNOSIS — N183 Chronic kidney disease, stage 3 unspecified: Secondary | ICD-10-CM | POA: Diagnosis not present

## 2021-12-14 DIAGNOSIS — Z720 Tobacco use: Secondary | ICD-10-CM

## 2021-12-14 DIAGNOSIS — C539 Malignant neoplasm of cervix uteri, unspecified: Secondary | ICD-10-CM

## 2021-12-14 DIAGNOSIS — C3491 Malignant neoplasm of unspecified part of right bronchus or lung: Secondary | ICD-10-CM | POA: Diagnosis not present

## 2021-12-14 DIAGNOSIS — Z5112 Encounter for antineoplastic immunotherapy: Secondary | ICD-10-CM

## 2021-12-14 DIAGNOSIS — N1831 Chronic kidney disease, stage 3a: Secondary | ICD-10-CM | POA: Diagnosis not present

## 2021-12-14 DIAGNOSIS — E785 Hyperlipidemia, unspecified: Secondary | ICD-10-CM | POA: Diagnosis not present

## 2021-12-14 DIAGNOSIS — E039 Hypothyroidism, unspecified: Secondary | ICD-10-CM | POA: Diagnosis not present

## 2021-12-14 DIAGNOSIS — I129 Hypertensive chronic kidney disease with stage 1 through stage 4 chronic kidney disease, or unspecified chronic kidney disease: Secondary | ICD-10-CM | POA: Diagnosis not present

## 2021-12-14 DIAGNOSIS — Z08 Encounter for follow-up examination after completed treatment for malignant neoplasm: Secondary | ICD-10-CM

## 2021-12-14 DIAGNOSIS — C538 Malignant neoplasm of overlapping sites of cervix uteri: Secondary | ICD-10-CM

## 2021-12-14 LAB — CBC WITH DIFFERENTIAL/PLATELET
Abs Immature Granulocytes: 0.02 10*3/uL (ref 0.00–0.07)
Basophils Absolute: 0 10*3/uL (ref 0.0–0.1)
Basophils Relative: 1 %
Eosinophils Absolute: 0.1 10*3/uL (ref 0.0–0.5)
Eosinophils Relative: 2 %
HCT: 31.8 % — ABNORMAL LOW (ref 36.0–46.0)
Hemoglobin: 10.4 g/dL — ABNORMAL LOW (ref 12.0–15.0)
Immature Granulocytes: 0 %
Lymphocytes Relative: 9 %
Lymphs Abs: 0.5 10*3/uL — ABNORMAL LOW (ref 0.7–4.0)
MCH: 32.2 pg (ref 26.0–34.0)
MCHC: 32.7 g/dL (ref 30.0–36.0)
MCV: 98.5 fL (ref 80.0–100.0)
Monocytes Absolute: 0.4 10*3/uL (ref 0.1–1.0)
Monocytes Relative: 8 %
Neutro Abs: 4.5 10*3/uL (ref 1.7–7.7)
Neutrophils Relative %: 80 %
Platelets: 290 10*3/uL (ref 150–400)
RBC: 3.23 MIL/uL — ABNORMAL LOW (ref 3.87–5.11)
RDW: 12.9 % (ref 11.5–15.5)
WBC: 5.7 10*3/uL (ref 4.0–10.5)
nRBC: 0 % (ref 0.0–0.2)

## 2021-12-14 LAB — COMPREHENSIVE METABOLIC PANEL
ALT: 9 U/L (ref 0–44)
AST: 22 U/L (ref 15–41)
Albumin: 3.3 g/dL — ABNORMAL LOW (ref 3.5–5.0)
Alkaline Phosphatase: 61 U/L (ref 38–126)
Anion gap: 7 (ref 5–15)
BUN: 15 mg/dL (ref 8–23)
CO2: 23 mmol/L (ref 22–32)
Calcium: 8.8 mg/dL — ABNORMAL LOW (ref 8.9–10.3)
Chloride: 104 mmol/L (ref 98–111)
Creatinine, Ser: 1.12 mg/dL — ABNORMAL HIGH (ref 0.44–1.00)
GFR, Estimated: 52 mL/min — ABNORMAL LOW (ref >60)
Glucose, Bld: 119 mg/dL — ABNORMAL HIGH (ref 70–99)
Potassium: 3.9 mmol/L (ref 3.5–5.1)
Sodium: 134 mmol/L — ABNORMAL LOW (ref 135–145)
Total Bilirubin: 0.5 mg/dL (ref 0.3–1.2)
Total Protein: 6.4 g/dL — ABNORMAL LOW (ref 6.5–8.1)

## 2021-12-14 MED ORDER — SODIUM CHLORIDE 0.9 % IV SOLN
Freq: Once | INTRAVENOUS | Status: AC
  Filled 2021-12-14: qty 250

## 2021-12-14 MED ORDER — SODIUM CHLORIDE 0.9 % IV SOLN
10.0000 mg/kg | Freq: Once | INTRAVENOUS | Status: AC
  Administered 2021-12-14: 620 mg via INTRAVENOUS
  Filled 2021-12-14: qty 2.4

## 2021-12-14 MED ORDER — HEPARIN SOD (PORK) LOCK FLUSH 100 UNIT/ML IV SOLN
500.0000 [IU] | Freq: Once | INTRAVENOUS | Status: AC | PRN
  Administered 2021-12-14: 500 [IU]
  Filled 2021-12-14: qty 5

## 2021-12-14 NOTE — Progress Notes (Signed)
[place]  [phone] [phone]  Patient Care Team: [name], DO as PCP - General (Family Medicine) Clent Jacks, RN as Oncology Nurse Navigator Noreene Filbert, MD as Radiation Oncologist (Radiation Oncology) Telford Nab, RN as Oncology Nurse Navigator [name], MD as Consulting Physician (Oncology)   Name of the patient: Mary Hoover  [id number]  1945-06-08   Date of visit: 12/14/2021  Gynecologic Oncology Interval Visit   Referring Provider: Dr. Livingston Diones Ob-Gyn  Chief Complaint: Stage IIIC squamous Cell Cervical Cancer, surveillance  Subjective:  Mary Hoover is a 76 y.o. female initially diagnosed with stage IIIc cervical squamous cell carcinoma s/p concurrent carboplatin (02/20/19-03/20/19) & radiation (02/17/2019- 03/24/19) followed by vaginal brachytherapy at [facility] completed 04/11/2019 with history of NSCLC s/p radiation who returns to clinic for follow-up and continued surveillance.  Patient's last pelvic exam was May 2022. No bleeding or other pelvic symptoms.  On maintenance immunotherapy for recurrent lung cancer.   # Lung Nodule/NSCLC- FDG avid on 11/27/19 PET. Reluctant to undergo surgery. Elected for empiric SBRT completed 2021.   11/20/2020, CT chest with contrast showed minimal residual of previously seen right upper lobe nodule.  0.6 x 0.4 cm.  Internal development of mild bandlike radiation fibrosis.  Newly enlarged pretracheal lymph node, measuring 2.5 x 1.7 cm.  Highly concerning for metastatic disease.  Unchanged prominent AP window and the right hilar lymph nodes.  Emphysema and diffuse bilateral bronchial wall thickening.  Coronary artery disease. 12/13/2020, PET scan showed enlarging hypermetabolic right lower paratracheal lymph node, compatible with metastatic disease.  SUV 10.3.  Mild FDG uptake of the bilateral sacral alla with associated lucency.  Concerning for sacral  insufficiency fracture.  Stable posttreatment changes of the right upper lobe nodule.  Nonspecific small solid 3 mm pulmonary nodule of the left lower lobe.  2 small to be characterized.  No evidence of FDG avid metastatic disease in the abdomen or pelvis.  Aortic atherosclerosis and emphysema.   Patient reestablish care with Dr Tasia Catchings on 12/27/2020 for lung cancer treatments    #01/12/2021 patient underwent bronchoscopy biopsy by Dr. Patsey Berthold Right paratracheal lymph node fine-needle aspiration is positive for metastatic non-small cell carcinoma, favor adenocarcinoma of lung origin.   Circulogen NGS negative PD-L1, ALK, ROS1, NTRK1/2/3,   # 02/14/2021-03/31/2021, concurrent carboplatin AUC 2 and taxol 7m/m2 with radiation.   04/26/2021, CT chest with contrast showed evolving postradiation changes in the posterior perifissural upper right lung without discrete residual measurable nodule in this location.  Right hilar/mediastinal lymphadenopathy stable to decreased.  Tiny 0.2 cm left lower lobe pulmonary nodule slightly decreased.  Positive response to treatment.  No new or progressive disease.  Three-vessel coronary arthrosclerosis.  Aortic atherosclerosis/emphysema. -concurrent chemotherapy and radiation-immunotherapy durvalumab maintenance.   #Chemo/radiation induced anemia, macrocytosis. Patient has normal vitamin B12, folate level.  Iron level showed adequate iron stores.    Gynecologic Oncology History  Mary Hoover a pleasant y.o. female who is seen in consultation from Dr. JWynetta Emeryfor new diagnosis of probable cervical cancer. Patient initially presented to her PCP with postmenopausal vaginal bleeding.  She was referred [facility]on [facility]  Last menstrual period around age 76  On exam, cervix was friable and firm to palpation extending anteriorly.  Urethral caruncle also noted.  Speculum exam caused some bleeding that required silver nitrate.  Pap and endometrial biopsy were  performed.  Endometrial biopsy returned some exudative material.  Findings are concerning for  neoplastic process.  Pap- 12/19/2018 - Squamous cell Carcinoma - High Risk HPV - negative  12/19/2018- Endometrial biopsy -  Scattered atypical squamous cells suspicious for malignancy  -  Predominantly necrosis with associated inflammation   CT Chest/Abdomen/Pelvis on 12/31/2018 for staging: Fluid distending the endometrial canal and/or endometrial thickening to the level of the cervix.  No adnexal mass or free fluid seen.  Haziness about the cervix of uncertain significance.  Chronic pleuroparenchymal scarring/fibrosis at lung apices, irregular pulmonary nodule within the Right upper lobe measuring 32m, moderate emphysema.   01/08/2019 Cervical biopsy. DIAGNOSIS:  A. CERVIX; BIOPSY:  - AT LEAST HIGH-GRADE SQUAMOUS INTRAEPITHELIAL LESION (HSIL/CIN3).  Comment: Invasion is not definitively identified in this sample.  Numerous step sections are examined.  Given the exophytic growth pattern of the lesion, an underlying invasive component cannot be excluded. The diagnosis of squamous cell carcinoma on recent pap smear is noted. Immunohistochemical stain for p16 is negative.   01/08/2019 PET IMPRESSION:1. Marked hypermetabolism in the region of the cervix, compatible with the reported history of cervical cancer. 2. Small bilateral pelvic sidewall lymph nodes show discernible FDG accumulation, concerning for metastatic disease although neither lymph node is enlarged by CT size criteria. 3. 7 mm nodule in the right upper lobe shows discernible FDG accumulation. Given the discernible uptake in a tiny lung nodule of this size, neoplasm is a distinct consideration. Primary bronchogenic carcinoma or metastatic disease could have this appearance. 4.  Aortic Atherosclerois (ICD10-170.0) 5.  Emphysema. (([id number])  Stage IIIc cervical squamous cell carcinoma s/p concurrent carboplatin (02/20/19-03/20/19) &  radiation (02/17/2019- 03/24/19) [place] [facility] [place] [facility] who returns to clinic for follow-up.   04/22/2019- CT Chest WO Contrast revealed stable right upper lobe lung nodule measuring 797m previously given the discernible uptake on PET from 01/08/2019, in a tiny lung nodule of this size, neoplasm is a distinct consideration. Primary bronchogenic carcinoma or metastatic disease could have this appearance. Recommend continued serial follow up to ensure stability.   11/26/20 PET/CT for persistent right lung nodule.  Pelvis and abdomen were negative.   IMPRESSION: 1. Since the previous PET-CT there has been interval increase in size and degree of FDG uptake associated with the perifissural right upper lobe lung nodule which is suspicious for malignancy. Given the smoking related changes within the lungs findings may represent a primary bronchogenic carcinoma. Metastatic disease is less favored but not excluded. 2. Mild increased FDG uptake associated with bilateral hilar lymph nodes and left AP window lymph node. This is equivocal for nodal metastasis. 3. No signs of FDG avid solid organ or nodal metastasis within the abdomen or pelvis.  Status post SBRT 12/21 to right upper lobe for presumed stage I lung non-small cell lung cancer with Dr ChBaruch GoutyShe declined surgery.   06/23/202 CT Chest  IMPRESSION: 1. Spiculated right upper lobe nodule shows slight growth from 12/31/2018 and is new from 02/02/2015. Together with visualization on PET 01/08/2019, findings are worrisome for adenocarcinoma. 2. Aortic atherosclerosis (ICD10-I70.0). Coronary artery calcification. 3.  Emphysema (ICD10-J43.9).  Saw Dr OaFaith Rogue/21 and he discussed options including surveillance vs resection. She is reluctant to have surgery because half a lung would have to be removed and she is already having significant dyspnea due to COPD.  Has reduced smoking to half PPD. Dr ChBaruch Goutyuggests another  CT in 3 months and if further growth he can treat this small area with radiation.  Colonoscopy: 2018 Mammogram: 01/26/21- diagnostic mammogram was recommended which she  hasn't had.   Also s/p carotid end arterectomy for carotid stenosis.  Oncology History  Cervical cancer ([place])  02/03/2019 Initial Diagnosis   Malignant neoplasm of cervix ([place])   02/20/2019 -  Chemotherapy   The patient had palonosetron (ALOXI) injection 0.25 mg, 0.25 mg, Intravenous,  Once, 5 of 5 cycles Administration: 0.25 mg (02/20/2019), 0.25 mg (02/27/2019), 0.25 mg (03/06/2019), 0.25 mg (03/13/2019), 0.25 mg (03/20/2019) CARBOplatin (PARAPLATIN) 150 mg in sodium chloride 0.9 % 100 mL chemo infusion, 150 mg (100 % of original dose 152.8 mg), Intravenous,  Once, 5 of 5 cycles Dose modification:   (original dose 152.8 mg, Cycle 1) Administration: 150 mg (02/20/2019), 150 mg (02/27/2019), 150 mg (03/06/2019), 150 mg (03/13/2019), 150 mg (03/20/2019)  for chemotherapy treatment.     Problem List: Patient Active Problem List   Diagnosis Date Noted   Skin abscess 11/04/2021   Neoplasm related pain 10/21/2021   Back pain 10/21/2021   Anemia in chronic kidney disease (CODE) 10/07/2021   CKD (chronic kidney disease) stage 3, GFR 30-59 ml/min ([place]) 09/24/2021   Encounter for antineoplastic immunotherapy 05/19/2021   Hypocalcemia 03/21/2021   Anemia due to antineoplastic chemotherapy 03/07/2021   Encounter for antineoplastic chemotherapy 02/21/2021   Aortic atherosclerosis ([place]) 01/04/2021   Paratracheal lymphadenopathy 12/27/2020   Osteoarthritis of spine with radiculopathy, lumbar region 06/10/2020   Hyponatremia 02/27/2019   Cervical cancer ([place]) 02/03/2019   Goals of care, counseling/discussion 02/03/2019   Carotid atherosclerosis 11/14/2016   Myopia of both eyes 01/11/2016   Primary lung adenocarcinoma ([place])    Essential hypertension 12/12/2014   Hemoptysis 12/11/2014   Hyperlipidemia    Status post carotid  endarterectomy    Tobacco use    COPD (chronic obstructive pulmonary disease) ([place]) 10/13/2013   Hypothyroidism 09/15/2013   Peripheral vascular disease ([place]) 09/15/2013   Past Medical History: Past Medical History:  Diagnosis Date   Arthritis    Cancer ([place])    Carotid atherosclerosis    Carotid bruit    Cervical cancer (H[facility])    COPD (chronic obstructive pulmonary disease) ([place])    emphysema   Coronary artery disease    Family history of adverse reaction to anesthesia    paternal grandfather died during surgery-pt unaware what happened   GERD (gastroesophageal reflux disease)    Hyperlipidemia    Hypertension    Hyponatremia 02/27/2019   Hypothyroidism    Infected cat bite 1980s   was hospitalized   Irregular heartbeat    Medical history non-contributory    Peripheral vascular disease ([place])    Tobacco use    Past Surgical History: Past Surgical History:  Procedure Laterality Date   CAROTID ENDARTERECTOMY Right Oct. 2015   Dr. Lucky Cowboy   CAROTID STENOSIS Left April 2016   carotid stenosis surgery   COLONOSCOPY WITH PROPOFOL N/A 11/08/2016   Procedure: COLONOSCOPY WITH PROPOFOL;  Surgeon: Jonathon Bellows, MD;  Location[facility] END[facility]COPY;  Service: Gastroenterology;  Laterality: N/A;   ESOPHAGOGASTRODUODENOSCOPY (EGD) WITH PROPOFOL N/A 11/08/2016   Procedure: ESOPHAGOGASTRODUODENOSCOPY (EGD) WITH PROPOFOL;  Surgeon: Jonathon Bellows, MD;  Location: [facility] ENDOSCOPY;  Service: Gastroenterology;  Laterality: N/A;   FLEXIBLE BRONCHOSCOPY N/A 12/17/2014   Procedure: FLEXIBLE BRONCHOSCOPY;  Surgeon: Wilhelmina Mcardle, MD;  Location: [facility] ORS;  Service: Pulmonary;  Laterality: N/A;   INCISION AND DRAINAGE / EXCISION THYROGLOSSAL CYST  April 2011   PORTA CATH INSERTION N/A 03/03/2021   Procedure: PORTA CATH INSERTION;  Surgeon: Algernon Huxley, MD;  Location: [facility] CV LAB;  Service: Cardiovascular;  Laterality: N/A;   VIDEO BRONCHOSCOPY WITH ENDOBRONCHIAL ULTRASOUND N/A 01/12/2021   Procedure:  VIDEO BRONCHOSCOPY WITH ENDOBRONCHIAL ULTRASOUND;  Surgeon: Tyler Pita, MD;  Location: [facility] ORS;  Service: Cardiopulmonary;  Laterality: N/A;   OB/GYN History:  She underwent menopause many years ago, no HRT.  Had 5 children. Not sexually active, husband had prostate cancer.    Family History: Family History  Problem Relation Age of Onset   Congestive Heart Failure Mother    Heart disease Mother    Hypertension Mother    Hypertension Sister    Diabetes Sister    Heart disease Sister    Heart disease Maternal Uncle    Heart disease Maternal Grandmother    Stroke Maternal Grandfather    Cancer Brother        liver, lung   Heart disease Brother    Hypertension Brother    Heart attack Brother    COPD Neg Hx    Breast cancer Neg Hx     Social History: Social History   Socioeconomic History   Marital status: Married    Spouse name: Deidre Ala   Number of children: Not on file   Years of education: Not on file   Highest education level: 9th grade  Occupational History   Occupation: retired   Tobacco Use   Smoking status: Every Day    Packs/day: 0.50    Years: 50.00    Total pack years: 25.00    Types: Cigarettes   Smokeless tobacco: Never   Tobacco comments:    0.5 PPD 02/10/2021  Vaping Use   Vaping Use: Former  Substance and Sexual Activity   Alcohol use: No    Alcohol/week: 0.0 standard drinks of alcohol   Drug use: No   Sexual activity: Yes  Other Topics Concern   Not on file  Social History Narrative   Lives at home with husband   Social Determinants of Health   Financial Resource Strain: Low Risk  (03/15/2021)   Overall Financial Resource Strain (CARDIA)    Difficulty of Paying Living Expenses: Not hard at all  Food Insecurity: No Food Insecurity (03/15/2021)   Hunger Vital Sign    Worried About Running Out of Food in the Last Year: Never true    [place] in the Last Year: Never true  Transportation Needs: No Transportation Needs (03/15/2021)    PRAPARE - Hydrologist (Medical): No    Lack of Transportation (Non-Medical): No  Physical Activity: Inactive (03/15/2021)   Exercise Vital Sign    Days of Exercise per Week: 0 days    Minutes of Exercise per Session: 0 min  Stress: No Stress Concern Present (03/15/2021)   [place]    Feeling of Stress : Not at all  Social Connections: Moderately Isolated (03/15/2021)   Social Connection and Isolation Panel [NHANES]    Frequency of Communication with Friends and Family: More than three times a week    Frequency of Social Gatherings with Friends and Family: More than three times a week    Attends Religious Services: Never    Marine scientist or Organizations: No    Attends Archivist Meetings: Never    Marital Status: Married  Human resources officer Violence: Not At Risk (03/15/2021)   Humiliation, Afraid, Rape, and Kick questionnaire    Fear of Current or Ex-Partner: No    Emotionally Abused: No  Physically Abused: No    Sexually Abused: No   Allergies: Allergies  Allergen Reactions   Atorvastatin Other (See Comments)    dizziness   Current Medications: Current Outpatient Medications  Medication Sig Dispense Refill   acetaminophen (TYLENOL) 500 MG tablet Take 500 mg by mouth every 6 (six) hours as needed for moderate pain.     alendronate (FOSAMAX) 70 MG tablet Take 1 tablet (70 mg total) by mouth every 7 (seven) days. Take with a full glass of water on an empty stomach. 12 tablet 3   chlorhexidine (PERIDEX) 0.12 % solution Use as directed 5 mLs in the mouth or throat 2 (two) times daily. 120 mL 0   clopidogrel (PLAVIX) 75 MG tablet TAKE 1 TABLET EVERY DAY 90 tablet 1   cyclobenzaprine (FLEXERIL) 10 MG tablet TAKE 1 TABLET AT BEDTIME 90 tablet 1   Fluticasone-Umeclidin-Vilant (TRELEGY ELLIPTA) 100-62.5-25 MCG/INH AEPB Inhale 1 puff into the lungs daily. 1 each 11    gabapentin (NEURONTIN) 100 MG capsule TAKE 1 CAPSULE THREE TIMES DAILY 270 capsule 3   Iron-Vitamin C 65-125 MG TABS Take 1 tablet by mouth daily. 30 tablet 3   levothyroxine (SYNTHROID) 88 MCG tablet Take 1 tablet (88 mcg total) by mouth daily. 90 tablet 3   lidocaine (LIDODERM) 5 % Place 1 patch onto the skin daily. Remove & Discard patch within 12 hours or as directed by MD 90 patch 1   lidocaine-prilocaine (EMLA) cream Apply 1 application topically as needed. 60 g 3   lisinopril (ZESTRIL) 20 MG tablet TAKE 1 TABLET EVERY DAY 90 tablet 0   Multiple Vitamin (MULTIVITAMIN) tablet Take 1 tablet by mouth daily.     omeprazole (PRILOSEC) 20 MG capsule TAKE 1 CAPSULE EVERY DAY 90 capsule 2   ondansetron (ZOFRAN) 8 MG tablet Take 1 tablet (8 mg total) by mouth 2 (two) times daily as needed for refractory nausea / vomiting. Start on day 3 after chemo. 30 tablet 1   traMADol (ULTRAM) 50 MG tablet Take 1-2 tablets (50-100 mg total) by mouth every 12 (twelve) hours as needed. 60 tablet 0   vitamin B-12 (CYANOCOBALAMIN) 500 MCG tablet Take 500 mcg by mouth daily.     No current facility-administered medications for this visit.   Review of Systems  Objective:  Physical Examination:  There were no vitals filed for this visit.  ECOG Performance Status: 1 - Symptomatic but completely ambulatory   EXAM 04/23/2019 GENERAL: Patient is a well appearing female in no acute distress HEENT:  PERRL, neck supple with midline trachea. Thyroid without masses.  NODES:  No cervical, supraclavicular, axillary, or inguinal lymphadenopathy palpated.  LUNGS:  Clear to auscultation bilaterally.  No wheezes or rhonchi. HEART:  Regular rate and rhythm. No murmur appreciated. ABDOMEN:  Soft, nontender.  Positive, normoactive bowel sounds.  EXTREMITIES:  No peripheral edema.   SKIN:  Clear with no obvious rashes or skin changes. No nail dyscrasia. NEURO:  Nonfocal. Well oriented.  Appropriate affect.  PELVIC: exam  chaperoned by nurse;   Vulva: normal appearing vulva with no masses, tenderness or lesions;  Vagina: atrophic, with some agglutination of upper vagina by filmy adhesions.  Cervix: The cervix is atrophic and small with no lesions seen Bimanual/RV: deferred  Lab Review n/a  Assessment:  Mary Hoover is a 76 y.o. female diagnosed with probably stage IIIc (T1b N1 Mx) squamous cell cancer of the cervix. On PET/CT large primary cervical cancer and small bilateral pelvic sidewall lymph nodes  show discernible FDG accumulation.  Treated with external radiation and carboplatin at [facility] and brachytherapy at [facility] and completed 03/2019. NED on exam today.  PAP done.  Lung cancer: Indeterminate 7 mm FDG active right upper lobe lung nodule, concerning for primary bronchogenic carcinoma.  11/27/19 PET/CT for persistent right lung nodule shows continued growth of PET positive lesion.  Status post SBRT 12/21 to right upper lobe for presumed stage I lung non-small cell lung cancer with Dr Baruch Gouty.  11/20/2020, CT chest with contrast showed minimal residual of previously seen right upper lobe nodule.  0.6 x 0.4 cm.  Newly enlarged pretracheal lymph node, measuring 2.5 x 1.7 cm. 12/13/2020, PET scan showed enlarging hypermetabolic right lower paratracheal lymph node, compatible with metastatic disease.  Patient reestablish care with Dr Tasia Catchings on 12/27/2020 for lung cancer treatments.  Right paratracheal lymph node fine-needle aspiration is positive for metastatic non-small cell carcinoma, favor adenocarcinoma of lung origin.   Circulogen NGS negative PD-L1, ALK, ROS1, NTRK1/2/3,   02/14/2021-03/31/2021, concurrent carboplatin AUC 2 and taxol 54m/m2 with radiation.   04/26/2021, CT chest with contrast showed evolving postradiation changes in the posterior perifissural upper right lung without discrete residual measurable nodule in this location.  Right hilar/mediastinal lymphadenopathy stable to decreased.  Tiny 0.2 cm  left lower lobe pulmonary nodule slightly decreased.  Positive response to treatment.,  Chemotherapy and radiation-immunotherapy durvalumab maintenance.   #Chemo/radiation induced anemia, macrocytosis. Patient has normal vitamin B12, folate level.  Iron level showed adequate iron stores.  Medical co-morbidities complicating care: heavy smoker/emphysema, vascular disease s/p carotid endarterectomy.  Plan:   Problem List Items Addressed This Visit       Genitourinary   [place]   Other Visit Diagnoses     Encounter for follow-up surveillance of cervical cancer    -  Primary   Relevant Orders   IGP, Aptima HPV      PAP done. She will use vaginal dilator daily for 10 minutes.    RTC 6 months and will see Dr CBaruch Goutyin the interim.  Also will continue with Dr YTasia Catchingsfor maintenance Durvulumab treatment of lung cancer. Follow up with Dr CBaruch Goutyfor lung cancer.   LVerlon Au NP  I personally interviewed and examined the patient. Agreed with the above/below plan of care. I have directly contributed to assessment and plan of care of this patient and educated and discussed with patient and family.  AMellody Drown MD

## 2021-12-14 NOTE — Assessment & Plan Note (Signed)
Recommend smoke cessation.  

## 2021-12-14 NOTE — Assessment & Plan Note (Signed)
Treatment plan was listed above

## 2021-12-14 NOTE — Assessment & Plan Note (Signed)
Monitor creatinine levels.  Avoid nephrotoxins. Encourage oral hydration.

## 2021-12-14 NOTE — Progress Notes (Signed)
Pt here for follow up. No new concerns voiced.   

## 2021-12-14 NOTE — Assessment & Plan Note (Signed)
Recommend patient to take Vitron C daily

## 2021-12-14 NOTE — Progress Notes (Signed)
Hematology/Oncology Progress note [phone] [phone]      Patient Care Team: [name], DO as PCP - General (Family Medicine) Clent Jacks, RN as Oncology Nurse Navigator Noreene Filbert, MD as Radiation Oncologist (Radiation Oncology) Telford Nab, RN as Oncology Nurse Navigator [name], MD as Consulting Physician (Oncology)  ASSESSMENT & PLAN:   Cancer Staging  Cervical cancer [facility]) Staging form: Cervix Uteri, AJCC Version 9 - Clinical: FIGO Stage IIICr (cTX, cN1) - Unsigned  Primary lung adenocarcinoma ([place]) Staging form: Lung, AJCC 7th Edition - Clinical stage from 01/12/2021: T1, N1, M0 - Signed by [name], MD on 01/29/2021   Primary lung adenocarcinoma ([place]) Stage III right lung adenocarcinoma,  Recurrent S/p concurrent chemotherapy and radiation- On immunotherapy durvalumab maintenance Labs reviewed and discussed with patient.  Proceed with Durvalumab treatments  Tobacco use Recommend smoke cessation.  Cervical cancer ([place]) Locally advanced cervical cancer stage IIIC1 with pelvic nodes involvement. S/p concurrent chemotherapy and radiation followed by brachytherapy.  She is not actively following up with gynecology oncology.    Encounter for antineoplastic immunotherapy Treatment plan was listed above  CKD (chronic kidney disease) stage 3, GFR 30-59 ml/min (HCC) Monitor creatinine levels.  Avoid nephrotoxins. Encourage oral hydration.   Anemia in chronic kidney disease (CODE) Recommend patient to take Vitron C daily  Orders Placed This Encounter  Procedures   CBC with Differential    Standing Status:   Future    Standing Expiration Date:   02/09/2023   Comprehensive metabolic panel    Standing Status:   Future    Standing Expiration Date:   02/09/2023   Follow up in 2 weeks.   All questions were answered. The patient knows to call the clinic with any problems, questions or concerns.  [name], MD, PhD [facility]  Health Hematology Oncology 12/14/2021     CHIEF COMPLAINTS/REASON FOR VISIT:  Follow up lung cancer treatments  HISTORY OF PRESENTING ILLNESS:  # Cervix cancer Patient has developed postmenopausal vaginal bleeding and discharge.  She was noted to have a friable cervix/2 cm mass of the cervix, concerning for malignancy 12/19/2018 endometrial biopsy showed scattered atypical squamous cells suspicious for malignancy.  Predominantly necrosis with associated inflammation. 01/01/2019 initial cervix biopsy showed at least high-grade squamous intraepithelial lesion.-HPV negative 01/22/2019, repeat vaginal wall biopsy and cervix 9:00 biopsy showed squamous cell carcinoma.    Staging images 12/31/2018 showed fluid distending the endometrial canal and or endometrial thickening to the level of cervix.  No evidence of pathological lymphadenopathy.  Haziness about the lower cervix.  7 mm irregular pulmonary nodule within the right upper lobe, suspicious for possible primary or metastatic malignancy.  Chronic pleural-parenchymal scarring/fibrosis at the lung apices.  Large amount of stool.  No bowel obstruction. 01/08/2019 PET scan showed marked hypermetabolism in the region of the cervix, compatible with the reported history of cervical cancer. Small bilateral pelvic sidewall lymph nodes discernible FDG accumulation, concerning for metastatic disease although neither lymph node is enlarged by CT size criteria. 7 mm nodule in the right upper lobe shows discernible FDG accumulation.  Questionable neoplasm, primary versus metastatic. Emphysema.   #Chronic hearing loss  # Lung nodule, FDG avid, questionable primary bronchogenic carcinoma versus metastatic cancer.Discussed with radiation oncology.  Her case was also discussed on tumor board.  Consensus reached on finishing concurrent chemoradiation treatments for cervix cancer first. Possible SBRT to lung nodule for presumed primary lung cancer.  # 02/20/2020 -  03/19/2020 concurrent chemotherapy weekly carboplatin and taxol  and radiation for treatment of this cervix cancer.   #December 2021 lung nodule,was evaluated by Dr.Oaks. Options of biopsy via transthoracic or endobronchial approach vs surgical resection.  Patient opted to empiric SBRT  Patient was last seen by me on 06/25/2019.  Lost follow-up. She continues to follow with radiation oncology. 11/20/2020, CT chest with contrast showed minimal residual of previously seen right upper lobe nodule.  0.6 x 0.4 cm.  Internal development of mild bandlike radiation fibrosis.  Newly enlarged pretracheal lymph node, measuring 2.5 x 1.7 cm.  Highly concerning for metastatic disease.  Unchanged prominent AP window and the right hilar lymph nodes.  Emphysema and diffuse bilateral bronchial wall thickening.  Coronary artery disease. 12/13/2020, PET scan showed enlarging hypermetabolic right lower paratracheal lymph node, compatible with metastatic disease.  SUV 10.3.  Mild FDG uptake of the bilateral sacral alla with associated lucency.  Concerning for sacral insufficiency fracture.  Stable posttreatment changes of the right upper lobe nodule.  Nonspecific small solid 3 mm pulmonary nodule of the left lower lobe.  2 small to be characterized.  No evidence of FDG avid metastatic disease in the abdomen or pelvis.  Aortic atherosclerosis and emphysema.  Patient reestablish care on 12/27/2020 for lung cancer treatments   #01/12/2021 patient underwent bronchoscopy biopsy by Dr. Patsey Berthold Right paratracheal lymph node fine-needle aspiration is positive for metastatic non-small cell carcinoma, favor adenocarcinoma of lung origin.  Circulogen NGS negative PD-L1, ALK, ROS1, NTRK1/2/3,   # 02/14/2021-03/31/2021, concurrent carboplatin AUC 2 and taxol 74m/m2 with radiation.  04/26/2021, CT chest with contrast showed evolving postradiation changes in the posterior perifissural upper right lung without discrete residual measurable  nodule in this location.  Right hilar/mediastinal lymphadenopathy stable to decreased.  Tiny 0.2 cm left lower lobe pulmonary nodule slightly decreased.  Positive response to treatment.  No new or progressive disease.  Three-vessel coronary arthrosclerosis.  Aortic atherosclerosis/emphysema.  INTERVAL HISTORY Mary KUZELis a 76y.o. female who has above history reviewed by me today presents for follow up visit for management of possible recurrent lung cancer.  History of cervix cancer.    No new complaints. She tolerates immunotherapy well.  She has noticed occasional nausea, symptom spontaneously improved. Back pain is controlled with pain medication.    Review of Systems  Constitutional:  Positive for fatigue. Negative for appetite change, chills, fever and unexpected weight change.  HENT:   Positive for hearing loss. Negative for voice change.   Eyes:  Negative for eye problems.  Respiratory:  Negative for chest tightness and cough.   Cardiovascular:  Negative for chest pain.  Gastrointestinal:  Positive for nausea. Negative for abdominal distention, abdominal pain and blood in stool.  Endocrine: Negative for hot flashes.  Genitourinary:  Negative for difficulty urinating and frequency.   Musculoskeletal:  Negative for arthralgias.  Skin:  Negative for itching and rash.  Neurological:  Negative for extremity weakness.  Hematological:  Negative for adenopathy.  Psychiatric/Behavioral:  Negative for confusion.     MEDICAL HISTORY:  Past Medical History:  Diagnosis Date   Arthritis    Cancer ([place]    Carotid atherosclerosis    Carotid bruit    Cervical cancer (HCC)    COPD (chronic obstructive pulmonary disease) (HCC)    emphysema   Coronary artery disease    Family history of adverse reaction to anesthesia    paternal grandfather died during surgery-pt unaware what happened   GERD (gastroesophageal reflux disease)    Hyperlipidemia  Hypertension    Hyponatremia  02/27/2019   Hypothyroidism    Infected cat bite 1980s   was hospitalized   Irregular heartbeat    Medical history non-contributory    Peripheral vascular disease ([place])    Tobacco use     SURGICAL HISTORY: Past Surgical History:  Procedure Laterality Date   CAROTID ENDARTERECTOMY Right Oct. 2015   Dr. Lucky Cowboy   CAROTID STENOSIS Left April 2016   carotid stenosis surgery   COLONOSCOPY WITH PROPOFOL N/A 11/08/2016   Procedure: COLONOSCOPY WITH PROPOFOL;  Surgeon: Jonathon Bellows, MD;  Location[facility] END[facility]COPY;  Service: Gastroenterology;  Laterality: N/A;   ESOPHAGOGASTRODUODENOSCOPY (EGD) WITH PROPOFOL N/A 11/08/2016   Procedure: ESOPHAGOGASTRODUODENOSCOPY (EGD) WITH PROPOFOL;  Surgeon: Jonathon Bellows, MD;  Location: [facility] ENDOSCOPY;  Service: Gastroenterology;  Laterality: N/A;   FLEXIBLE BRONCHOSCOPY N/A 12/17/2014   Procedure: FLEXIBLE BRONCHOSCOPY;  Surgeon: Wilhelmina Mcardle, MD;  Location: [facility] ORS;  Service: Pulmonary;  Laterality: N/A;   INCISION AND DRAINAGE / EXCISION THYROGLOSSAL CYST  April 2011   PORTA CATH INSERTION N/A 03/03/2021   Procedure: PORTA CATH INSERTION;  Surgeon: Algernon Huxley, MD;  Location: [facility] CV LAB;  Service: Cardiovascular;  Laterality: N/A;   VIDEO BRONCHOSCOPY WITH ENDOBRONCHIAL ULTRASOUND N/A 01/12/2021   Procedure: VIDEO BRONCHOSCOPY WITH ENDOBRONCHIAL ULTRASOUND;  Surgeon: Tyler Pita, MD;  Location: [facility] ORS;  Service: Cardiopulmonary;  Laterality: N/A;    SOCIAL HISTORY: Social History   Socioeconomic History   Marital status: Married    Spouse name: Deidre Ala   Number of children: Not on file   Years of education: Not on file   Highest education level: 9th grade  Occupational History   Occupation: retired   Tobacco Use   Smoking status: Every Day    Packs/day: 0.50    Years: 50.00    Total pack years: 25.00    Types: Cigarettes   Smokeless tobacco: Never   Tobacco comments:    0.5 PPD 02/10/2021  Vaping Use   Vaping Use: Former   Substance and Sexual Activity   Alcohol use: No    Alcohol/week: 0.0 standard drinks of alcohol   Drug use: No   Sexual activity: Yes  Other Topics Concern   Not on file  Social History Narrative   Lives at home with husband   Social Determinants of Health   Financial Resource Strain: Low Risk  (03/15/2021)   Overall Financial Resource Strain (CARDIA)    Difficulty of Paying Living Expenses: Not hard at all  Food Insecurity: No Food Insecurity (03/15/2021)   Hunger Vital Sign    Worried About Running Out of Food in the Last Year: Never true    [place] in the Last Year: Never true  Transportation Needs: No Transportation Needs (03/15/2021)   PRAPARE - Hydrologist (Medical): No    Lack of Transportation (Non-Medical): No  Physical Activity: Inactive (03/15/2021)   Exercise Vital Sign    Days of Exercise per Week: 0 days    Minutes of Exercise per Session: 0 min  Stress: No Stress Concern Present (03/15/2021)   [place]    Feeling of Stress : Not at all  Social Connections: Moderately Isolated (03/15/2021)   Social Connection and Isolation Panel [NHANES]    Frequency of Communication with Friends and Family: More than three times a week    Frequency of Social Gatherings with Friends and Family: More than  three times a week    Attends Religious Services: Never    Active Member of Clubs or Organizations: No    Attends Archivist Meetings: Never    Marital Status: Married  Human resources officer Violence: Not At Risk (03/15/2021)   Humiliation, Afraid, Rape, and Kick questionnaire    Fear of Current or Ex-Partner: No    Emotionally Abused: No    Physically Abused: No    Sexually Abused: No    FAMILY HISTORY: Family History  Problem Relation Age of Onset   Congestive Heart Failure Mother    Heart disease Mother    Hypertension Mother    Hypertension Sister    Diabetes  Sister    Heart disease Sister    Heart disease Maternal Uncle    Heart disease Maternal Grandmother    Stroke Maternal Grandfather    Cancer Brother        liver, lung   Heart disease Brother    Hypertension Brother    Heart attack Brother    COPD Neg Hx    Breast cancer Neg Hx     ALLERGIES:  is allergic to atorvastatin.  MEDICATIONS:  Current Outpatient Medications  Medication Sig Dispense Refill   acetaminophen (TYLENOL) 500 MG tablet Take 500 mg by mouth every 6 (six) hours as needed for moderate pain.     alendronate (FOSAMAX) 70 MG tablet Take 1 tablet (70 mg total) by mouth every 7 (seven) days. Take with a full glass of water on an empty stomach. 12 tablet 3   chlorhexidine (PERIDEX) 0.12 % solution Use as directed 5 mLs in the mouth or throat 2 (two) times daily. 120 mL 0   clopidogrel (PLAVIX) 75 MG tablet TAKE 1 TABLET EVERY DAY 90 tablet 1   cyclobenzaprine (FLEXERIL) 10 MG tablet TAKE 1 TABLET AT BEDTIME 90 tablet 1   Fluticasone-Umeclidin-Vilant (TRELEGY ELLIPTA) 100-62.5-25 MCG/INH AEPB Inhale 1 puff into the lungs daily. 1 each 11   gabapentin (NEURONTIN) 100 MG capsule TAKE 1 CAPSULE THREE TIMES DAILY 270 capsule 3   Iron-Vitamin C 65-125 MG TABS Take 1 tablet by mouth daily. 30 tablet 3   levothyroxine (SYNTHROID) 88 MCG tablet Take 1 tablet (88 mcg total) by mouth daily. 90 tablet 3   lidocaine (LIDODERM) 5 % Place 1 patch onto the skin daily. Remove & Discard patch within 12 hours or as directed by MD 90 patch 1   lidocaine-prilocaine (EMLA) cream Apply 1 application topically as needed. 60 g 3   lisinopril (ZESTRIL) 20 MG tablet TAKE 1 TABLET EVERY DAY 90 tablet 0   Multiple Vitamin (MULTIVITAMIN) tablet Take 1 tablet by mouth daily.     omeprazole (PRILOSEC) 20 MG capsule TAKE 1 CAPSULE EVERY DAY 90 capsule 2   ondansetron (ZOFRAN) 8 MG tablet Take 1 tablet (8 mg total) by mouth 2 (two) times daily as needed for refractory nausea / vomiting. Start on day 3  after chemo. 30 tablet 1   traMADol (ULTRAM) 50 MG tablet Take 1-2 tablets (50-100 mg total) by mouth every 12 (twelve) hours as needed. 60 tablet 0   vitamin B-12 (CYANOCOBALAMIN) 500 MCG tablet Take 500 mcg by mouth daily.     No current facility-administered medications for this visit.     PHYSICAL EXAMINATION: ECOG PERFORMANCE STATUS: 1 - Symptomatic but completely ambulatory Vitals:   12/14/21 0855  BP: (!) 123/55  Pulse: 85  Resp: 18  Temp: 98.6 F (37 C)  SpO2: 99%  Filed Weights   12/14/21 0855  Weight: 133 lb 8 oz (60.6 kg)     Physical Exam Constitutional:      General: She is not in acute distress. HENT:     Head: Normocephalic and atraumatic.  Eyes:     General: No scleral icterus.    Pupils: Pupils are equal, round, and reactive to light.  Cardiovascular:     Rate and Rhythm: Normal rate.  Pulmonary:     Effort: Pulmonary effort is normal. No respiratory distress.     Breath sounds: No wheezing.     Comments: Decreased breath sound bilaterally  Abdominal:     General: Bowel sounds are normal. There is no distension.     Palpations: Abdomen is soft.  Musculoskeletal:        General: No deformity. Normal range of motion.     Cervical back: Normal range of motion and neck supple.     Comments: Trace edema bilaterally  Skin:    General: Skin is warm and dry.  Neurological:     Mental Status: She is alert and oriented to person, place, and time. Mental status is at baseline.     Cranial Nerves: No cranial nerve deficit.     Coordination: Coordination normal.  Psychiatric:        Mood and Affect: Mood normal.      LABORATORY DATA:  I have reviewed the data as listed    Latest Ref Rng & Units 12/14/2021    8:37 AM 11/30/2021    9:15 AM 11/16/2021    9:56 AM  CBC  WBC 4.0 - 10.5 K/uL 5.7  5.6  5.7   Hemoglobin 12.0 - 15.0 g/dL 10.4  10.2  10.5   Hematocrit 36.0 - 46.0 % 31.8  30.2  31.9   Platelets 150 - 400 K/uL 290  229  290        Latest Ref Rng & Units 12/14/2021    8:37 AM 11/30/2021    9:15 AM 11/16/2021    9:56 AM  CMP  Glucose 70 - 99 mg/dL 119  108  88   BUN 8 - 23 mg/dL _0 Creatinine 0.44 - 1.00 mg/dL 1.12  0.83  1.48   Sodium 135 - 145 mmol/L 134  135  133   Potassium 3.5 - 5.1 mmol/L 3.9  3.8  4.5   Chloride 98 - 111 mmol/L 104  107  104   CO2 22 - 32 mmol/L _1 Calcium 8.9 - 10.3 mg/dL 8.8  8.5  9.2   Total Protein 6.5 - 8.1 g/dL 6.4  6.3  6.7   Total Bilirubin 0.3 - 1.2 mg/dL 0.5  0.4  0.4   Alkaline Phos 38 - 126 U/L 61  68  62   AST 15 - 41 U/L _2 ALT 0 - 44 U/L _3 Iron/TIBC/Ferritin/ %Sat    Component Value Date/Time   IRON 90 05/04/2021 0929   TIBC 321 05/04/2021 0929   FERRITIN 52 05/04/2021 0929   IRONPCTSAT 28 05/04/2021 0929      RADIOGRAPHIC STUDIES: I have personally reviewed the radiological images as listed and agreed with the findings in the report. CT CHEST ABDOMEN PELVIS W CONTRAST  Result Date: 10/26/2021 CLINICAL DATA:  A 76 year old female with history of lung and cervical cancer presents for follow-up. * Tracking  Code: BO * EXAM: CT CHEST, ABDOMEN, AND PELVIS WITH CONTRAST TECHNIQUE: Multidetector CT imaging of the chest, abdomen and pelvis was performed following the standard protocol during bolus administration of intravenous contrast. RADIATION DOSE REDUCTION: This exam was performed according to the departmental dose-optimization program which includes automated exposure control, adjustment of the mA and/or kV according to patient size and/or use of iterative reconstruction technique. CONTRAST:  191m OMNIPAQUE IOHEXOL 300 MG/ML  SOLN COMPARISON:  July 25, 2021 FINDINGS: CT CHEST FINDINGS Cardiovascular: LEFT-sided Port-A-Cath terminates in the upper RIGHT atrium. Calcified and noncalcified aortic atherosclerotic plaque no pericardial effusion or thickening. Central pulmonary vasculature is normal caliber. Mediastinum/Nodes: No  adenopathy in the chest. Mild thickening of the esophagus is unchanged likely relating to post treatment changes. Stable 9 mm RIGHT hilar lymph node. Lungs/Pleura: Pulmonary emphysema as before. Bandlike consolidative changes in the posterior RIGHT upper lobe appear more organized at the periphery. No focal masslike characteristics. Stable granuloma at the RIGHT lung base with calcification. No consolidation or sign of pleural effusion. Nodule in the LEFT lower lobe a 4 mm may be very slowly increasing in size over time, certainly as compared to March of 2023. (Image 92/3) Dependent pleural thickening along the posterior RIGHT chest is stable some of which could reflect post treatment changes particularly in the upper chest along the plane of post radiation changes. Small amounts material is seen in the dependent trachea and RIGHT mainstem bronchus not changed from previous imaging. Musculoskeletal: No chest wall mass, see below for full musculoskeletal details. CT ABDOMEN PELVIS FINDINGS Hepatobiliary: Lobular hepatic contours. Portal vein is patent. No pericholecystic stranding or biliary duct dilation. No suspicious hepatic lesion. Pancreas: Normal, without mass, inflammation or ductal dilatation. Spleen: Normal. Adrenals/Urinary Tract: Adrenal glands are unremarkable. Symmetric renal enhancement. No sign of hydronephrosis. No suspicious renal lesion or perinephric stranding. Stable cyst in the posterior interpolar RIGHT kidney measuring 12 x 8 mm. No dedicated imaging follow-up is recommended for this finding. Urinary bladder is grossly unremarkable. Stomach/Bowel: No perigastric stranding. No sign of small bowel obstruction or acute colonic process. Appendix is normal. Vascular/Lymphatic: Aortic atherosclerosis. No sign of aneurysm. Smooth contour of the IVC. There is no gastrohepatic or hepatoduodenal ligament lymphadenopathy. No retroperitoneal or mesenteric lymphadenopathy. No pelvic sidewall  lymphadenopathy. Atherosclerotic changes are moderate to marked both calcified and noncalcified plaque present in the abdominal aorta. Reproductive: Unremarkable by CT. Other: No ascites. Musculoskeletal: Sclerosis of the sacrum with similar pattern of sclerosis compared to prior imaging from November of 2022. Signs of sacral insufficiency fracture visible along the RIGHT and LEFT sacral ala also similar to November of 2022. No destructive bone finding. No acute bone process. Spinal degenerative changes. IMPRESSION: 1. Bandlike consolidative changes in the posterior RIGHT upper lobe appear more organized at the periphery. No focal masslike characteristics. Findings are compatible with evolving post treatment changes. Findings are favored to represent evolving post treatment changes. 2. Nodule in the LEFT lower lobe a 4 mm may be very slowly increasing in size over time, certainly as compared to March of 2023. Continued attention on follow-up is suggested. 3. Stable 9 mm RIGHT hilar lymph node. 4. Evidence of sacral insufficiency fractures not substantially changed. 5. Pulmonary emphysema and aortic atherosclerosis. Electronically Signed   By: GZetta BillsM.D.   On: 10/26/2021 11:47

## 2021-12-14 NOTE — Assessment & Plan Note (Signed)
Locally advanced cervical cancer stage IIIC1 with pelvic nodes involvement. S/p concurrent chemotherapy and radiation followed by brachytherapy.  She is not actively following up with gynecology oncology.

## 2021-12-14 NOTE — Assessment & Plan Note (Addendum)
Stage III right lung adenocarcinoma,  Recurrent S/p concurrent chemotherapy and radiation- On immunotherapy durvalumab maintenance Labs reviewed and discussed with patient.  Proceed with Durvalumab treatments

## 2021-12-15 ENCOUNTER — Other Ambulatory Visit: Payer: Self-pay

## 2021-12-18 LAB — IGP, APTIMA HPV: HPV Aptima: NEGATIVE

## 2021-12-19 ENCOUNTER — Telehealth: Payer: Self-pay | Admitting: Nurse Practitioner

## 2021-12-19 DIAGNOSIS — C539 Malignant neoplasm of cervix uteri, unspecified: Secondary | ICD-10-CM

## 2021-12-19 NOTE — Telephone Encounter (Signed)
Called patient to review pap results. No answer. Unable to leave vm. Follow up as scheduled. Pap was normal.

## 2021-12-26 ENCOUNTER — Other Ambulatory Visit: Payer: Self-pay | Admitting: *Deleted

## 2021-12-26 MED ORDER — TRAMADOL HCL 50 MG PO TABS: 50.0000 mg | ORAL_TABLET | Freq: Two times a day (BID) | ORAL | 0 refills | Status: DC | PRN

## 2021-12-28 ENCOUNTER — Inpatient Hospital Stay: Payer: Medicare PPO | Admitting: Oncology

## 2021-12-28 ENCOUNTER — Other Ambulatory Visit: Payer: Self-pay | Admitting: Oncology

## 2021-12-28 ENCOUNTER — Inpatient Hospital Stay: Payer: Medicare PPO

## 2021-12-28 ENCOUNTER — Telehealth: Payer: Self-pay | Admitting: Oncology

## 2021-12-28 NOTE — Telephone Encounter (Signed)
Dr. Tasia Catchings has updated IS to 12/6 with +3 day tolerance. Please schedule and inform pt of new appts.

## 2021-12-28 NOTE — Telephone Encounter (Signed)
Pt left VM stating her back was hurting so she was unable to make it to her tx appt this morning.

## 2021-12-28 NOTE — Telephone Encounter (Signed)
Please check when there will be availability in infusion for pt to get tx so Dr. Tasia Catchings can update IS accordingly.

## 2021-12-28 NOTE — Assessment & Plan Note (Deleted)
Stage III right lung adenocarcinoma,  Recurrent S/p concurrent chemotherapy and radiation- On immunotherapy durvalumab maintenance Labs reviewed and discussed with patient.  Proceed with Durvalumab treatments

## 2021-12-29 ENCOUNTER — Encounter: Payer: Self-pay | Admitting: Oncology

## 2021-12-29 NOTE — Telephone Encounter (Signed)
Appt reminder mailed to pt with note stating for her to call us if appts don't work out.

## 2022-01-04 ENCOUNTER — Inpatient Hospital Stay: Payer: Medicare PPO | Attending: Oncology

## 2022-01-04 ENCOUNTER — Encounter: Payer: Self-pay | Admitting: Oncology

## 2022-01-04 ENCOUNTER — Inpatient Hospital Stay: Payer: Medicare PPO

## 2022-01-04 ENCOUNTER — Inpatient Hospital Stay (HOSPITAL_BASED_OUTPATIENT_CLINIC_OR_DEPARTMENT_OTHER): Payer: Medicare PPO | Admitting: Oncology

## 2022-01-04 VITALS — BP 105/59 | HR 100 | Temp 98.0°F | Wt 132.7 lb

## 2022-01-04 VITALS — Resp 20

## 2022-01-04 DIAGNOSIS — I129 Hypertensive chronic kidney disease with stage 1 through stage 4 chronic kidney disease, or unspecified chronic kidney disease: Secondary | ICD-10-CM | POA: Diagnosis not present

## 2022-01-04 DIAGNOSIS — C3491 Malignant neoplasm of unspecified part of right bronchus or lung: Secondary | ICD-10-CM

## 2022-01-04 DIAGNOSIS — M549 Dorsalgia, unspecified: Secondary | ICD-10-CM | POA: Insufficient documentation

## 2022-01-04 DIAGNOSIS — G8929 Other chronic pain: Secondary | ICD-10-CM | POA: Diagnosis not present

## 2022-01-04 DIAGNOSIS — Z720 Tobacco use: Secondary | ICD-10-CM | POA: Diagnosis not present

## 2022-01-04 DIAGNOSIS — R634 Abnormal weight loss: Secondary | ICD-10-CM | POA: Diagnosis not present

## 2022-01-04 DIAGNOSIS — Z5112 Encounter for antineoplastic immunotherapy: Secondary | ICD-10-CM | POA: Diagnosis not present

## 2022-01-04 DIAGNOSIS — Z79899 Other long term (current) drug therapy: Secondary | ICD-10-CM | POA: Diagnosis not present

## 2022-01-04 DIAGNOSIS — N183 Chronic kidney disease, stage 3 unspecified: Secondary | ICD-10-CM | POA: Diagnosis not present

## 2022-01-04 DIAGNOSIS — R110 Nausea: Secondary | ICD-10-CM | POA: Diagnosis not present

## 2022-01-04 DIAGNOSIS — D631 Anemia in chronic kidney disease: Secondary | ICD-10-CM | POA: Diagnosis not present

## 2022-01-04 DIAGNOSIS — M545 Low back pain, unspecified: Secondary | ICD-10-CM

## 2022-01-04 DIAGNOSIS — F17210 Nicotine dependence, cigarettes, uncomplicated: Secondary | ICD-10-CM | POA: Diagnosis not present

## 2022-01-04 DIAGNOSIS — N1831 Chronic kidney disease, stage 3a: Secondary | ICD-10-CM

## 2022-01-04 LAB — CBC WITH DIFFERENTIAL/PLATELET
Abs Immature Granulocytes: 0.14 10*3/uL — ABNORMAL HIGH (ref 0.00–0.07)
Basophils Absolute: 0 10*3/uL (ref 0.0–0.1)
Basophils Relative: 1 %
Eosinophils Absolute: 0.2 10*3/uL (ref 0.0–0.5)
Eosinophils Relative: 3 %
HCT: 29.6 % — ABNORMAL LOW (ref 36.0–46.0)
Hemoglobin: 9.9 g/dL — ABNORMAL LOW (ref 12.0–15.0)
Immature Granulocytes: 2 %
Lymphocytes Relative: 9 %
Lymphs Abs: 0.6 10*3/uL — ABNORMAL LOW (ref 0.7–4.0)
MCH: 31.7 pg (ref 26.0–34.0)
MCHC: 33.4 g/dL (ref 30.0–36.0)
MCV: 94.9 fL (ref 80.0–100.0)
Monocytes Absolute: 0.5 10*3/uL (ref 0.1–1.0)
Monocytes Relative: 7 %
Neutro Abs: 5.4 10*3/uL (ref 1.7–7.7)
Neutrophils Relative %: 78 %
Platelets: 363 10*3/uL (ref 150–400)
RBC: 3.12 MIL/uL — ABNORMAL LOW (ref 3.87–5.11)
RDW: 12.8 % (ref 11.5–15.5)
WBC: 6.9 10*3/uL (ref 4.0–10.5)
nRBC: 0 % (ref 0.0–0.2)

## 2022-01-04 LAB — COMPREHENSIVE METABOLIC PANEL
ALT: 15 U/L (ref 0–44)
AST: 22 U/L (ref 15–41)
Albumin: 2.9 g/dL — ABNORMAL LOW (ref 3.5–5.0)
Alkaline Phosphatase: 105 U/L (ref 38–126)
Anion gap: 7 (ref 5–15)
BUN: 14 mg/dL (ref 8–23)
CO2: 24 mmol/L (ref 22–32)
Calcium: 8.7 mg/dL — ABNORMAL LOW (ref 8.9–10.3)
Chloride: 103 mmol/L (ref 98–111)
Creatinine, Ser: 1.08 mg/dL — ABNORMAL HIGH (ref 0.44–1.00)
GFR, Estimated: 54 mL/min — ABNORMAL LOW (ref >60)
Glucose, Bld: 140 mg/dL — ABNORMAL HIGH (ref 70–99)
Potassium: 4.3 mmol/L (ref 3.5–5.1)
Sodium: 134 mmol/L — ABNORMAL LOW (ref 135–145)
Total Bilirubin: 0.4 mg/dL (ref 0.3–1.2)
Total Protein: 6.6 g/dL (ref 6.5–8.1)

## 2022-01-04 MED ORDER — HEPARIN SOD (PORK) LOCK FLUSH 100 UNIT/ML IV SOLN
500.0000 [IU] | Freq: Once | INTRAVENOUS | Status: AC | PRN
  Administered 2022-01-04: 500 [IU]
  Filled 2022-01-04: qty 5

## 2022-01-04 MED ORDER — SODIUM CHLORIDE 0.9 % IV SOLN
10.0000 mg/kg | Freq: Once | INTRAVENOUS | Status: AC
  Administered 2022-01-04: 620 mg via INTRAVENOUS
  Filled 2022-01-04: qty 2.4

## 2022-01-04 MED ORDER — SODIUM CHLORIDE 0.9% FLUSH
10.0000 mL | INTRAVENOUS | Status: DC | PRN
  Administered 2022-01-04: 10 mL
  Filled 2022-01-04: qty 10

## 2022-01-04 MED ORDER — SODIUM CHLORIDE 0.9 % IV SOLN
Freq: Once | INTRAVENOUS | Status: AC
  Filled 2022-01-04: qty 250

## 2022-01-04 MED ORDER — ONDANSETRON HCL 8 MG PO TABS: 8.0000 mg | ORAL_TABLET | Freq: Three times a day (TID) | ORAL | 1 refills | Status: DC | PRN

## 2022-01-04 MED ORDER — PROCHLORPERAZINE MALEATE 10 MG PO TABS: 10.0000 mg | ORAL_TABLET | Freq: Four times a day (QID) | ORAL | 0 refills | Status: DC | PRN

## 2022-01-04 NOTE — Progress Notes (Signed)
Hematology/Oncology Progress note [phone] [phone]      Patient Care Team: [name], DO as PCP - General (Family Medicine) Clent Jacks, RN as Oncology Nurse Navigator Noreene Filbert, MD as Radiation Oncologist (Radiation Oncology) Telford Nab, RN as Oncology Nurse Navigator [name], MD as Consulting Physician (Oncology)  ASSESSMENT & PLAN:   Cancer Staging  Cervical cancer [facility]) Staging form: Cervix Uteri, AJCC Version 9 - Clinical: FIGO Stage IIICr (cTX, cN1) - Unsigned  Primary lung adenocarcinoma ([place]) Staging form: Lung, AJCC 7th Edition - Clinical stage from 01/12/2021: T1, N1, M0 - Signed by [name], MD on 01/29/2021   Primary lung adenocarcinoma ([facility]) Stage III right lung adenocarcinoma,  Recurrent S/p concurrent chemotherapy and radiation- On immunotherapy durvalumab maintenance Labs reviewed and discussed with patient.  Proceed with Durvalumab treatments  Nausea without vomiting Intermittent chronic problem for patient. We discussed about antiemetics instructions.  I sent her prescription of Zofran and Compazine as needed.  Weight loss Patient declined nutritionist evaluation. Recommend patient to start nutrition supplement Samples provided to her.  Back pain Continue tramadol every 6 hours as needed.    CKD (chronic kidney disease) stage 3, GFR 30-59 ml/min (HCC) Monitor creatinine levels.  Avoid nephrotoxins. Encourage oral hydration.   Tobacco use Recommend smoke cessation.  Anemia in chronic kidney disease (CODE) Recommend patient to take Vitron C daily  No orders of the defined types were placed in this encounter. Repeat CT in January 2024. Follow up in 2 weeks.  Lab MD Durvalumab.  All questions were answered. The patient knows to call the clinic with any problems, questions or concerns.  [name], MD, PhD [facility] Hematology Oncology 01/04/2022     CHIEF COMPLAINTS/REASON FOR VISIT:   Follow up lung cancer treatments  HISTORY OF PRESENTING ILLNESS:  # Cervix cancer Patient has developed postmenopausal vaginal bleeding and discharge.  She was noted to have a friable cervix/2 cm mass of the cervix, concerning for malignancy 12/19/2018 endometrial biopsy showed scattered atypical squamous cells suspicious for malignancy.  Predominantly necrosis with associated inflammation. 01/01/2019 initial cervix biopsy showed at least high-grade squamous intraepithelial lesion.-HPV negative 01/22/2019, repeat vaginal wall biopsy and cervix 9:00 biopsy showed squamous cell carcinoma.    Staging images 12/31/2018 showed fluid distending the endometrial canal and or endometrial thickening to the level of cervix.  No evidence of pathological lymphadenopathy.  Haziness about the lower cervix.  7 mm irregular pulmonary nodule within the right upper lobe, suspicious for possible primary or metastatic malignancy.  Chronic pleural-parenchymal scarring/fibrosis at the lung apices.  Large amount of stool.  No bowel obstruction. 01/08/2019 PET scan showed marked hypermetabolism in the region of the cervix, compatible with the reported history of cervical cancer. Small bilateral pelvic sidewall lymph nodes discernible FDG accumulation, concerning for metastatic disease although neither lymph node is enlarged by CT size criteria. 7 mm nodule in the right upper lobe shows discernible FDG accumulation.  Questionable neoplasm, primary versus metastatic. Emphysema.   #Chronic hearing loss  # Lung nodule, FDG avid, questionable primary bronchogenic carcinoma versus metastatic cancer.Discussed with radiation oncology.  Her case was also discussed on tumor board.  Consensus reached on finishing concurrent chemoradiation treatments for cervix cancer first. Possible SBRT to lung nodule for presumed primary lung cancer.  # 02/20/2020 - 03/19/2020 concurrent chemotherapy weekly carboplatin and taxol and radiation for  treatment of this cervix cancer.   #December 2021 lung nodule,was evaluated by Dr.Oaks. Options of biopsy via transthoracic or  endobronchial approach vs surgical resection.  Patient opted to empiric SBRT  Patient was last seen by me on 06/25/2019.  Lost follow-up. She continues to follow with radiation oncology. 11/20/2020, CT chest with contrast showed minimal residual of previously seen right upper lobe nodule.  0.6 x 0.4 cm.  Internal development of mild bandlike radiation fibrosis.  Newly enlarged pretracheal lymph node, measuring 2.5 x 1.7 cm.  Highly concerning for metastatic disease.  Unchanged prominent AP window and the right hilar lymph nodes.  Emphysema and diffuse bilateral bronchial wall thickening.  Coronary artery disease. 12/13/2020, PET scan showed enlarging hypermetabolic right lower paratracheal lymph node, compatible with metastatic disease.  SUV 10.3.  Mild FDG uptake of the bilateral sacral alla with associated lucency.  Concerning for sacral insufficiency fracture.  Stable posttreatment changes of the right upper lobe nodule.  Nonspecific small solid 3 mm pulmonary nodule of the left lower lobe.  2 small to be characterized.  No evidence of FDG avid metastatic disease in the abdomen or pelvis.  Aortic atherosclerosis and emphysema.  Patient reestablish care on 12/27/2020 for lung cancer treatments   #01/12/2021 patient underwent bronchoscopy biopsy by Dr. Patsey Berthold Right paratracheal lymph node fine-needle aspiration is positive for metastatic non-small cell carcinoma, favor adenocarcinoma of lung origin.  Circulogen NGS negative PD-L1, ALK, ROS1, NTRK1/2/3,   # 02/14/2021-03/31/2021, concurrent carboplatin AUC 2 and taxol 43m/m2 with radiation.  04/26/2021, CT chest with contrast showed evolving postradiation changes in the posterior perifissural upper right lung without discrete residual measurable nodule in this location.  Right hilar/mediastinal lymphadenopathy stable to  decreased.  Tiny 0.2 cm left lower lobe pulmonary nodule slightly decreased.  Positive response to treatment.  No new or progressive disease.  Three-vessel coronary arthrosclerosis.  Aortic atherosclerosis/emphysema.  INTERVAL HISTORY Mary FABIOis a 76y.o. female who has above history reviewed by me today presents for follow up visit for management of possible recurrent lung cancer.  History of cervix cancer.   She tolerates immunotherapy well.  Back pain is controlled with pain medication.  She currently used to have ongoing intermittent nausea.  Appetite is not good.  She has lost weight. Chronic cough due to smoking.  Review of Systems  Constitutional:  Positive for fatigue. Negative for appetite change, chills, fever and unexpected weight change.  HENT:   Positive for hearing loss. Negative for voice change.   Eyes:  Negative for eye problems.  Respiratory:  Positive for cough. Negative for chest tightness.   Cardiovascular:  Negative for chest pain.  Gastrointestinal:  Positive for nausea. Negative for abdominal distention, abdominal pain and blood in stool.  Endocrine: Negative for hot flashes.  Genitourinary:  Negative for difficulty urinating and frequency.   Musculoskeletal:  Negative for arthralgias.  Skin:  Negative for itching and rash.  Neurological:  Negative for extremity weakness.  Hematological:  Negative for adenopathy.  Psychiatric/Behavioral:  Negative for confusion.     MEDICAL HISTORY:  Past Medical History:  Diagnosis Date   Arthritis    Cancer ([facility]    Carotid atherosclerosis    Carotid bruit    Cervical cancer (HCC)    COPD (chronic obstructive pulmonary disease) (HCC)    emphysema   Coronary artery disease    Family history of adverse reaction to anesthesia    paternal grandfather died during surgery-pt unaware what happened   GERD (gastroesophageal reflux disease)    Hyperlipidemia    Hypertension    Hyponatremia 02/27/2019    Hypothyroidism  Infected cat bite 1980s   was hospitalized   Irregular heartbeat    Medical history non-contributory    Peripheral vascular disease ([place])    Tobacco use     SURGICAL HISTORY: Past Surgical History:  Procedure Laterality Date   CAROTID ENDARTERECTOMY Right Oct. 2015   Dr. Lucky Cowboy   CAROTID STENOSIS Left April 2016   carotid stenosis surgery   COLONOSCOPY WITH PROPOFOL N/A 11/08/2016   Procedure: COLONOSCOPY WITH PROPOFOL;  Surgeon: Jonathon Bellows, MD;  Location[facility] ENDOSCOPY;  Service: Gastroenterology;  Laterality: N/A;   ESOPHAGOGASTRODUODENOSCOPY (EGD) WITH PROPOFOL N/A 11/08/2016   Procedure: ESOPHAGOGASTRODUODENOSCOPY (EGD) WITH PROPOFOL;  Surgeon: Jonathon Bellows, MD;  Location: [facility] ENDOSCOPY;  Service: Gastroenterology;  Laterality: N/A;   FLEXIBLE BRONCHOSCOPY N/A 12/17/2014   Procedure: FLEXIBLE BRONCHOSCOPY;  Surgeon: Wilhelmina Mcardle, MD;  Location: [facility] ORS;  Service: Pulmonary;  Laterality: N/A;   INCISION AND DRAINAGE / EXCISION THYROGLOSSAL CYST  April 2011   PORTA CATH INSERTION N/A 03/03/2021   Procedure: PORTA CATH INSERTION;  Surgeon: Algernon Huxley, MD;  Location: [facility];  Service: Cardiovascular;  Laterality: N/A;   VIDEO BRONCHOSCOPY WITH ENDOBRONCHIAL ULTRASOUND N/A 01/12/2021   Procedure: VIDEO BRONCHOSCOPY WITH ENDOBRONCHIAL ULTRASOUND;  Surgeon: Tyler Pita, MD;  Location: [facility] ORS;  Service: Cardiopulmonary;  Laterality: N/A;    SOCIAL HISTORY: Social History   Socioeconomic History   Marital status: Married    Spouse name: Deidre Ala   Number of children: Not on file   Years of education: Not on file   Highest education level: 9th grade  Occupational History   Occupation: retired   Tobacco Use   Smoking status: Every Day    Packs/day: 0.50    Years: 50.00    Total pack years: 25.00    Types: Cigarettes   Smokeless tobacco: Never   Tobacco comments:    0.5 PPD 02/10/2021  Vaping Use   Vaping Use: Former  Substance and  Sexual Activity   Alcohol use: No    Alcohol/week: 0.0 standard drinks of alcohol   Drug use: No   Sexual activity: Yes  Other Topics Concern   Not on file  Social History Narrative   Lives at home with husband   Social Determinants of Health   Financial Resource Strain: Low Risk  (03/15/2021)   Overall Financial Resource Strain (CARDIA)    Difficulty of Paying Living Expenses: Not hard at all  Food Insecurity: No Food Insecurity (03/15/2021)   Hunger Vital Sign    Worried About Running Out of Food in the Last Year: Never true    [place] in the Last Year: Never true  Transportation Needs: No Transportation Needs (03/15/2021)   PRAPARE - Hydrologist (Medical): No    Lack of Transportation (Non-Medical): No  Physical Activity: Inactive (03/15/2021)   Exercise Vital Sign    Days of Exercise per Week: 0 days    Minutes of Exercise per Session: 0 min  Stress: No Stress Concern Present (03/15/2021)   [place]    Feeling of Stress : Not at all  Social Connections: Moderately Isolated (03/15/2021)   Social Connection and Isolation Panel [NHANES]    Frequency of Communication with Friends and Family: More than three times a week    Frequency of Social Gatherings with Friends and Family: More than three times a week    Attends Religious Services: Never  Active Member of Clubs or Organizations: No    Attends Archivist Meetings: Never    Marital Status: Married  Human resources officer Violence: Not At Risk (03/15/2021)   Humiliation, Afraid, Rape, and Kick questionnaire    Fear of Current or Ex-Partner: No    Emotionally Abused: No    Physically Abused: No    Sexually Abused: No    FAMILY HISTORY: Family History  Problem Relation Age of Onset   Congestive Heart Failure Mother    Heart disease Mother    Hypertension Mother    Hypertension Sister    Diabetes Sister    Heart  disease Sister    Heart disease Maternal Uncle    Heart disease Maternal Grandmother    Stroke Maternal Grandfather    Cancer Brother        liver, lung   Heart disease Brother    Hypertension Brother    Heart attack Brother    COPD Neg Hx    Breast cancer Neg Hx     ALLERGIES:  is allergic to atorvastatin.  MEDICATIONS:  Current Outpatient Medications  Medication Sig Dispense Refill   acetaminophen (TYLENOL) 500 MG tablet Take 500 mg by mouth every 6 (six) hours as needed for moderate pain.     alendronate (FOSAMAX) 70 MG tablet Take 1 tablet (70 mg total) by mouth every 7 (seven) days. Take with a full glass of water on an empty stomach. 12 tablet 3   chlorhexidine (PERIDEX) 0.12 % solution Use as directed 5 mLs in the mouth or throat 2 (two) times daily. 120 mL 0   clopidogrel (PLAVIX) 75 MG tablet TAKE 1 TABLET EVERY DAY 90 tablet 1   cyclobenzaprine (FLEXERIL) 10 MG tablet TAKE 1 TABLET AT BEDTIME 90 tablet 1   Fluticasone-Umeclidin-Vilant (TRELEGY ELLIPTA) 100-62.5-25 MCG/INH AEPB Inhale 1 puff into the lungs daily. 1 each 11   gabapentin (NEURONTIN) 100 MG capsule TAKE 1 CAPSULE THREE TIMES DAILY 270 capsule 3   Iron-Vitamin C 65-125 MG TABS Take 1 tablet by mouth daily. 30 tablet 3   levothyroxine (SYNTHROID) 88 MCG tablet Take 1 tablet (88 mcg total) by mouth daily. 90 tablet 3   lidocaine (LIDODERM) 5 % Place 1 patch onto the skin daily. Remove & Discard patch within 12 hours or as directed by MD 90 patch 1   lidocaine-prilocaine (EMLA) cream Apply 1 application topically as needed. 60 g 3   lisinopril (ZESTRIL) 20 MG tablet TAKE 1 TABLET EVERY DAY 90 tablet 0   Multiple Vitamin (MULTIVITAMIN) tablet Take 1 tablet by mouth daily.     omeprazole (PRILOSEC) 20 MG capsule TAKE 1 CAPSULE EVERY DAY 90 capsule 2   prochlorperazine (COMPAZINE) 10 MG tablet Take 1 tablet (10 mg total) by mouth every 6 (six) hours as needed for nausea or vomiting. 30 tablet 0   traMADol (ULTRAM) 50  MG tablet Take 1-2 tablets (50-100 mg total) by mouth every 12 (twelve) hours as needed. 60 tablet 0   vitamin B-12 (CYANOCOBALAMIN) 500 MCG tablet Take 500 mcg by mouth daily.     ondansetron (ZOFRAN) 8 MG tablet Take 1 tablet (8 mg total) by mouth every 8 (eight) hours as needed for refractory nausea / vomiting. Start on day 3 after chemo. 30 tablet 1   No current facility-administered medications for this visit.   Facility-Administered Medications Ordered in Other Visits  Medication Dose Route Frequency Provider Last Rate Last Admin   sodium chloride flush (NS) 0.9 %  injection 10 mL  10 mL Intracatheter PRN [name], MD   10 mL at 01/04/22 1050     PHYSICAL EXAMINATION: ECOG PERFORMANCE STATUS: 1 - Symptomatic but completely ambulatory Vitals:   01/04/22 0827  BP: (!) 105/59  Pulse: 100  Temp: 98 F (36.7 C)  SpO2: 100%    Filed Weights   01/04/22 0827  Weight: 132 lb 11.2 oz (60.2 kg)     Physical Exam Constitutional:      General: She is not in acute distress. HENT:     Head: Normocephalic and atraumatic.  Eyes:     General: No scleral icterus.    Pupils: Pupils are equal, round, and reactive to light.  Cardiovascular:     Rate and Rhythm: Normal rate.  Pulmonary:     Effort: Pulmonary effort is normal. No respiratory distress.     Breath sounds: No wheezing.     Comments: Decreased breath sound bilaterally  Abdominal:     General: Bowel sounds are normal. There is no distension.     Palpations: Abdomen is soft.  Musculoskeletal:        General: No deformity. Normal range of motion.     Cervical back: Normal range of motion and neck supple.     Comments: Trace edema bilaterally  Skin:    General: Skin is warm and dry.  Neurological:     Mental Status: She is alert and oriented to person, place, and time. Mental status is at baseline.     Cranial Nerves: No cranial nerve deficit.     Coordination: Coordination normal.  Psychiatric:        Mood and Affect:  Mood normal.      LABORATORY DATA:  I have reviewed the data as listed    Latest Ref Rng & Units 01/04/2022    8:11 AM 12/14/2021    8:37 AM 11/30/2021    9:15 AM  CBC  WBC 4.0 - 10.5 K/uL 6.9  5.7  5.6   Hemoglobin 12.0 - 15.0 g/dL 9.9  10.4  10.2   Hematocrit 36.0 - 46.0 % 29.6  31.8  30.2   Platelets 150 - 400 K/uL 363  290  229       Latest Ref Rng & Units 01/04/2022    8:11 AM 12/14/2021    8:37 AM 11/30/2021    9:15 AM  CMP  Glucose 70 - 99 mg/dL 140  119  108   BUN 8 - 23 mg/dL _0 Creatinine 0.44 - 1.00 mg/dL 1.08  1.12  0.83   Sodium 135 - 145 mmol/L 134  134  135   Potassium 3.5 - 5.1 mmol/L 4.3  3.9  3.8   Chloride 98 - 111 mmol/L 103  104  107   CO2 22 - 32 mmol/L _1 Calcium 8.9 - 10.3 mg/dL 8.7  8.8  8.5   Total Protein 6.5 - 8.1 g/dL 6.6  6.4  6.3   Total Bilirubin 0.3 - 1.2 mg/dL 0.4  0.5  0.4   Alkaline Phos 38 - 126 U/L 105  61  68   AST 15 - 41 U/L _2 ALT 0 - 44 U/L _3 Iron/TIBC/Ferritin/ %Sat    Component Value Date/Time   IRON 90 05/04/2021 0929   TIBC 321 05/04/2021 0929   FERRITIN 52 05/04/2021 0929  IRONPCTSAT 28 05/04/2021 0929      RADIOGRAPHIC STUDIES: I have personally reviewed the radiological images as listed and agreed with the findings in the report. CT CHEST ABDOMEN PELVIS W CONTRAST  Result Date: 10/26/2021 CLINICAL DATA:  A 76 year old female with history of lung and cervical cancer presents for follow-up. * Tracking Code: BO * EXAM: CT CHEST, ABDOMEN, AND PELVIS WITH CONTRAST TECHNIQUE: Multidetector CT imaging of the chest, abdomen and pelvis was performed following the standard protocol during bolus administration of intravenous contrast. RADIATION DOSE REDUCTION: This exam was performed according to the departmental dose-optimization program which includes automated exposure control, adjustment of the mA and/or kV according to patient size and/or use of iterative reconstruction technique.  CONTRAST:  157m OMNIPAQUE IOHEXOL 300 MG/ML  SOLN COMPARISON:  July 25, 2021 FINDINGS: CT CHEST FINDINGS Cardiovascular: LEFT-sided Port-A-Cath terminates in the upper RIGHT atrium. Calcified and noncalcified aortic atherosclerotic plaque no pericardial effusion or thickening. Central pulmonary vasculature is normal caliber. Mediastinum/Nodes: No adenopathy in the chest. Mild thickening of the esophagus is unchanged likely relating to post treatment changes. Stable 9 mm RIGHT hilar lymph node. Lungs/Pleura: Pulmonary emphysema as before. Bandlike consolidative changes in the posterior RIGHT upper lobe appear more organized at the periphery. No focal masslike characteristics. Stable granuloma at the RIGHT lung base with calcification. No consolidation or sign of pleural effusion. Nodule in the LEFT lower lobe a 4 mm may be very slowly increasing in size over time, certainly as compared to March of 2023. (Image 92/3) Dependent pleural thickening along the posterior RIGHT chest is stable some of which could reflect post treatment changes particularly in the upper chest along the plane of post radiation changes. Small amounts material is seen in the dependent trachea and RIGHT mainstem bronchus not changed from previous imaging. Musculoskeletal: No chest wall mass, see below for full musculoskeletal details. CT ABDOMEN PELVIS FINDINGS Hepatobiliary: Lobular hepatic contours. Portal vein is patent. No pericholecystic stranding or biliary duct dilation. No suspicious hepatic lesion. Pancreas: Normal, without mass, inflammation or ductal dilatation. Spleen: Normal. Adrenals/Urinary Tract: Adrenal glands are unremarkable. Symmetric renal enhancement. No sign of hydronephrosis. No suspicious renal lesion or perinephric stranding. Stable cyst in the posterior interpolar RIGHT kidney measuring 12 x 8 mm. No dedicated imaging follow-up is recommended for this finding. Urinary bladder is grossly unremarkable. Stomach/Bowel: No  perigastric stranding. No sign of small bowel obstruction or acute colonic process. Appendix is normal. Vascular/Lymphatic: Aortic atherosclerosis. No sign of aneurysm. Smooth contour of the IVC. There is no gastrohepatic or hepatoduodenal ligament lymphadenopathy. No retroperitoneal or mesenteric lymphadenopathy. No pelvic sidewall lymphadenopathy. Atherosclerotic changes are moderate to marked both calcified and noncalcified plaque present in the abdominal aorta. Reproductive: Unremarkable by CT. Other: No ascites. Musculoskeletal: Sclerosis of the sacrum with similar pattern of sclerosis compared to prior imaging from November of 2022. Signs of sacral insufficiency fracture visible along the RIGHT and LEFT sacral ala also similar to November of 2022. No destructive bone finding. No acute bone process. Spinal degenerative changes. IMPRESSION: 1. Bandlike consolidative changes in the posterior RIGHT upper lobe appear more organized at the periphery. No focal masslike characteristics. Findings are compatible with evolving post treatment changes. Findings are favored to represent evolving post treatment changes. 2. Nodule in the LEFT lower lobe a 4 mm may be very slowly increasing in size over time, certainly as compared to March of 2023. Continued attention on follow-up is suggested. 3. Stable 9 mm RIGHT hilar lymph node. 4. Evidence of sacral  insufficiency fractures not substantially changed. 5. Pulmonary emphysema and aortic atherosclerosis. Electronically Signed   By: Zetta Bills M.D.   On: 10/26/2021 11:47

## 2022-01-04 NOTE — Assessment & Plan Note (Signed)
Recommend patient to take Vitron C daily

## 2022-01-04 NOTE — Assessment & Plan Note (Addendum)
Patient declined nutritionist evaluation. Recommend patient to start nutrition supplement Samples provided to her.

## 2022-01-04 NOTE — Assessment & Plan Note (Signed)
Recommend smoke cessation.  

## 2022-01-04 NOTE — Assessment & Plan Note (Signed)
Monitor creatinine levels.  Avoid nephrotoxins. Encourage oral hydration.

## 2022-01-04 NOTE — Assessment & Plan Note (Signed)
Intermittent chronic problem for patient. We discussed about antiemetics instructions.  I sent her prescription of Zofran and Compazine as needed.

## 2022-01-04 NOTE — Assessment & Plan Note (Signed)
Continue tramadol every 6 hours as needed.

## 2022-01-04 NOTE — Patient Instructions (Signed)
[facility] AT [place]  Discharge Instructions: Thank you for choosing [facility] to provide your oncology and hematology care.  If you have a lab appointment with the [facility], please go directly to the [facility] and check in at the registration area.  Wear comfortable clothing and clothing appropriate for easy access to any Portacath or PICC line.   We strive to give you quality time with your provider. You may need to reschedule your appointment if you arrive late (15 or more minutes).  Arriving late affects you and other patients whose appointments are after yours.  Also, if you miss three or more appointments without notifying the office, you may be dismissed from the clinic at the provider's discretion.      For prescription refill requests, have your pharmacy contact our office and allow 72 hours for refills to be completed.    Today you received the following chemotherapy and/or immunotherapy agents: Durvalumab     To help prevent nausea and vomiting after your treatment, we encourage you to take your nausea medication as directed.  BELOW ARE SYMPTOMS THAT SHOULD BE REPORTED IMMEDIATELY: *FEVER GREATER THAN 100.4 F (38 C) OR HIGHER *CHILLS OR SWEATING *NAUSEA AND VOMITING THAT IS NOT CONTROLLED WITH YOUR NAUSEA MEDICATION *UNUSUAL SHORTNESS OF BREATH *UNUSUAL BRUISING OR BLEEDING *URINARY PROBLEMS (pain or burning when urinating, or frequent urination) *BOWEL PROBLEMS (unusual diarrhea, constipation, pain near the anus) TENDERNESS IN MOUTH AND THROAT WITH OR WITHOUT PRESENCE OF ULCERS (sore throat, sores in mouth, or a toothache) UNUSUAL RASH, SWELLING OR PAIN  UNUSUAL VAGINAL DISCHARGE OR ITCHING   Items with * indicate a potential emergency and should be followed up as soon as possible or go to the Emergency Department if any problems should occur.  Please show the CHEMOTHERAPY ALERT CARD or IMMUNOTHERAPY ALERT CARD at check-in to  the Emergency Department and triage nurse.  Should you have questions after your visit or need to cancel or reschedule your appointment, please contact [facility] [place] AT [employer]  [phone] and follow the prompts.  Office hours are 8:00 a.m. to 4:30 p.m. Monday - Friday. Please note that voicemails left after 4:00 p.m. may not be returned until the following business day.  We are closed weekends and major holidays. You have access to a nurse at all times for urgent questions. Please call the main number to the clinic [phone] and follow the prompts.  For any non-urgent questions, you may also contact your provider using MyChart. We now offer e-Visits for anyone 39 and older to request care online for non-urgent symptoms. For details visit [email or web address].   Also download the MyChart app! Go to the app store, search "MyChart", open the app, select [facility], and log in with your MyChart username and password.  Masks are optional in the cancer centers. If you would like for your care team to wear a mask while they are taking care of you, please let them know. For doctor visits, patients may have with them one support person who is at least 74 years old. At this time, visitors are not allowed in the infusion area.

## 2022-01-04 NOTE — Assessment & Plan Note (Signed)
Stage III right lung adenocarcinoma,  Recurrent S/p concurrent chemotherapy and radiation- On immunotherapy durvalumab maintenance Labs reviewed and discussed with patient.  Proceed with Durvalumab treatments

## 2022-01-11 ENCOUNTER — Other Ambulatory Visit: Payer: Medicare PPO

## 2022-01-11 ENCOUNTER — Ambulatory Visit: Payer: Medicare PPO

## 2022-01-11 ENCOUNTER — Ambulatory Visit: Payer: Medicare PPO | Admitting: Oncology

## 2022-01-17 ENCOUNTER — Other Ambulatory Visit: Payer: Self-pay | Admitting: *Deleted

## 2022-01-17 DIAGNOSIS — C3491 Malignant neoplasm of unspecified part of right bronchus or lung: Secondary | ICD-10-CM

## 2022-01-17 MED ORDER — ONDANSETRON HCL 8 MG PO TABS: 8.0000 mg | ORAL_TABLET | Freq: Three times a day (TID) | ORAL | 1 refills | Status: DC | PRN

## 2022-01-17 MED ORDER — TRAMADOL HCL 50 MG PO TABS: 50.0000 mg | ORAL_TABLET | Freq: Two times a day (BID) | ORAL | 0 refills | Status: DC | PRN

## 2022-01-18 ENCOUNTER — Inpatient Hospital Stay: Payer: Medicare PPO

## 2022-01-18 ENCOUNTER — Inpatient Hospital Stay (HOSPITAL_BASED_OUTPATIENT_CLINIC_OR_DEPARTMENT_OTHER): Payer: Medicare PPO | Admitting: Oncology

## 2022-01-18 ENCOUNTER — Encounter: Payer: Self-pay | Admitting: Oncology

## 2022-01-18 VITALS — BP 100/57 | HR 100 | Temp 98.6°F | Wt 129.5 lb

## 2022-01-18 DIAGNOSIS — F17210 Nicotine dependence, cigarettes, uncomplicated: Secondary | ICD-10-CM | POA: Diagnosis not present

## 2022-01-18 DIAGNOSIS — M549 Dorsalgia, unspecified: Secondary | ICD-10-CM | POA: Diagnosis not present

## 2022-01-18 DIAGNOSIS — Z720 Tobacco use: Secondary | ICD-10-CM

## 2022-01-18 DIAGNOSIS — C3491 Malignant neoplasm of unspecified part of right bronchus or lung: Secondary | ICD-10-CM

## 2022-01-18 DIAGNOSIS — M545 Low back pain, unspecified: Secondary | ICD-10-CM | POA: Diagnosis not present

## 2022-01-18 DIAGNOSIS — Z5112 Encounter for antineoplastic immunotherapy: Secondary | ICD-10-CM | POA: Diagnosis not present

## 2022-01-18 DIAGNOSIS — D631 Anemia in chronic kidney disease: Secondary | ICD-10-CM

## 2022-01-18 DIAGNOSIS — I129 Hypertensive chronic kidney disease with stage 1 through stage 4 chronic kidney disease, or unspecified chronic kidney disease: Secondary | ICD-10-CM | POA: Diagnosis not present

## 2022-01-18 DIAGNOSIS — N1831 Chronic kidney disease, stage 3a: Secondary | ICD-10-CM | POA: Diagnosis not present

## 2022-01-18 DIAGNOSIS — G8929 Other chronic pain: Secondary | ICD-10-CM

## 2022-01-18 DIAGNOSIS — Z79899 Other long term (current) drug therapy: Secondary | ICD-10-CM | POA: Diagnosis not present

## 2022-01-18 DIAGNOSIS — N183 Chronic kidney disease, stage 3 unspecified: Secondary | ICD-10-CM | POA: Diagnosis not present

## 2022-01-18 DIAGNOSIS — R110 Nausea: Secondary | ICD-10-CM | POA: Diagnosis not present

## 2022-01-18 LAB — CBC WITH DIFFERENTIAL/PLATELET
Abs Immature Granulocytes: 0.05 10*3/uL (ref 0.00–0.07)
Basophils Absolute: 0 10*3/uL (ref 0.0–0.1)
Basophils Relative: 0 %
Eosinophils Absolute: 0.2 10*3/uL (ref 0.0–0.5)
Eosinophils Relative: 2 %
HCT: 28.6 % — ABNORMAL LOW (ref 36.0–46.0)
Hemoglobin: 9.5 g/dL — ABNORMAL LOW (ref 12.0–15.0)
Immature Granulocytes: 1 %
Lymphocytes Relative: 9 %
Lymphs Abs: 0.7 10*3/uL (ref 0.7–4.0)
MCH: 31.3 pg (ref 26.0–34.0)
MCHC: 33.2 g/dL (ref 30.0–36.0)
MCV: 94.1 fL (ref 80.0–100.0)
Monocytes Absolute: 0.6 10*3/uL (ref 0.1–1.0)
Monocytes Relative: 8 %
Neutro Abs: 6.4 10*3/uL (ref 1.7–7.7)
Neutrophils Relative %: 80 %
Platelets: 345 10*3/uL (ref 150–400)
RBC: 3.04 MIL/uL — ABNORMAL LOW (ref 3.87–5.11)
RDW: 13.2 % (ref 11.5–15.5)
WBC: 8 10*3/uL (ref 4.0–10.5)
nRBC: 0 % (ref 0.0–0.2)

## 2022-01-18 LAB — COMPREHENSIVE METABOLIC PANEL
ALT: 10 U/L (ref 0–44)
AST: 19 U/L (ref 15–41)
Albumin: 3.4 g/dL — ABNORMAL LOW (ref 3.5–5.0)
Alkaline Phosphatase: 74 U/L (ref 38–126)
Anion gap: 8 (ref 5–15)
BUN: 21 mg/dL (ref 8–23)
CO2: 24 mmol/L (ref 22–32)
Calcium: 9 mg/dL (ref 8.9–10.3)
Chloride: 102 mmol/L (ref 98–111)
Creatinine, Ser: 1.44 mg/dL — ABNORMAL HIGH (ref 0.44–1.00)
GFR, Estimated: 38 mL/min — ABNORMAL LOW (ref >60)
Glucose, Bld: 97 mg/dL (ref 70–99)
Potassium: 4.7 mmol/L (ref 3.5–5.1)
Sodium: 134 mmol/L — ABNORMAL LOW (ref 135–145)
Total Bilirubin: 0.4 mg/dL (ref 0.3–1.2)
Total Protein: 6.7 g/dL (ref 6.5–8.1)

## 2022-01-18 MED ORDER — SODIUM CHLORIDE 0.9 % IV SOLN
10.0000 mg/kg | Freq: Once | INTRAVENOUS | Status: AC
  Administered 2022-01-18: 620 mg via INTRAVENOUS
  Filled 2022-01-18: qty 2.4

## 2022-01-18 MED ORDER — SODIUM CHLORIDE 0.9 % IV SOLN
INTRAVENOUS | Status: DC
  Filled 2022-01-18 (×2): qty 250

## 2022-01-18 MED ORDER — SODIUM CHLORIDE 0.9% FLUSH
10.0000 mL | Freq: Once | INTRAVENOUS | Status: AC
  Administered 2022-01-18: 10 mL via INTRAVENOUS
  Filled 2022-01-18: qty 10

## 2022-01-18 MED ORDER — HEPARIN SOD (PORK) LOCK FLUSH 100 UNIT/ML IV SOLN
500.0000 [IU] | Freq: Once | INTRAVENOUS | Status: AC | PRN
  Administered 2022-01-18: 500 [IU]
  Filled 2022-01-18: qty 5

## 2022-01-18 NOTE — Patient Instructions (Signed)
[facility] AT [place] [facility]  Discharge Instructions: Thank you for choosing [facility] to provide your oncology and hematology care.  If you have a lab appointment with the Cancer Center, please go directly to the Cancer Center and check in at the registration area.  Wear comfortable clothing and clothing appropriate for easy access to any Portacath or PICC line.   We strive to give you quality time with your provider. You may need to reschedule your appointment if you arrive late (15 or more minutes).  Arriving late affects you and other patients whose appointments are after yours.  Also, if you miss three or more appointments without notifying the office, you may be dismissed from the clinic at the provider's discretion.      For prescription refill requests, have your pharmacy contact our office and allow 72 hours for refills to be completed.       To help prevent nausea and vomiting after your treatment, we encourage you to take your nausea medication as directed.  BELOW ARE SYMPTOMS THAT SHOULD BE REPORTED IMMEDIATELY: *FEVER GREATER THAN 100.4 F (38 C) OR HIGHER *CHILLS OR SWEATING *NAUSEA AND VOMITING THAT IS NOT CONTROLLED WITH YOUR NAUSEA MEDICATION *UNUSUAL SHORTNESS OF BREATH *UNUSUAL BRUISING OR BLEEDING *URINARY PROBLEMS (pain or burning when urinating, or frequent urination) *BOWEL PROBLEMS (unusual diarrhea, constipation, pain near the anus) TENDERNESS IN MOUTH AND THROAT WITH OR WITHOUT PRESENCE OF ULCERS (sore throat, sores in mouth, or a toothache) UNUSUAL RASH, SWELLING OR PAIN  UNUSUAL VAGINAL DISCHARGE OR ITCHING   Items with * indicate a potential emergency and should be followed up as soon as possible or go to the Emergency Department if any problems should occur.  Please show the CHEMOTHERAPY ALERT CARD or IMMUNOTHERAPY ALERT CARD at check-in to the Emergency Department and triage nurse.  Should you have questions after your  visit or need to cancel or reschedule your appointment, please contact [facility] [place]  [phone] and follow the prompts.  Office hours are 8:00 a.m. to 4:30 p.m. Monday - Friday. Please note that voicemails left after 4:00 p.m. may not be returned until the following business day.  We are closed weekends and major holidays. You have access to a nurse at all times for urgent questions. Please call the main number to the clinic [phone] and follow the prompts.  For any non-urgent questions, you may also contact your provider using MyChart. We now offer e-Visits for anyone 18 and older to request care online for non-urgent symptoms. For details visit [email or web address].   Also download the MyChart app! Go to the app store, search "MyChart", open the app, select [facility], and log in with your MyChart username and password.  Masks are optional in the cancer centers. If you would like for your care team to wear a mask while they are taking care of you, please let them know. For doctor visits, patients may have with them one support person who is at least 74 years old. At this time, visitors are not allowed in the infusion area.   

## 2022-01-19 ENCOUNTER — Encounter: Payer: Self-pay | Admitting: Oncology

## 2022-01-19 NOTE — Assessment & Plan Note (Signed)
Recommend smoke cessation.  

## 2022-01-19 NOTE — Assessment & Plan Note (Signed)
Stage III right lung adenocarcinoma,  Recurrent S/p concurrent chemotherapy and radiation- On immunotherapy durvalumab maintenance Labs reviewed and discussed with patient.  Proceed with Durvalumab treatments

## 2022-01-19 NOTE — Assessment & Plan Note (Signed)
Continue tramadol every 6 hours as needed.

## 2022-01-19 NOTE — Assessment & Plan Note (Signed)
Recommend patient to take Vitron C daily

## 2022-01-19 NOTE — Assessment & Plan Note (Signed)
Intermittent chronic problem for patient. Continue Zofran and Compazine as needed.

## 2022-01-19 NOTE — Assessment & Plan Note (Signed)
Monitor creatinine levels.  Avoid nephrotoxins. Encourage oral hydration.

## 2022-01-19 NOTE — Progress Notes (Signed)
Hematology/Oncology Progress note [phone] [phone]      Patient Care Team: Mary Roys, DO as PCP - General (Family Medicine) Mary Jacks, RN as Oncology Nurse Navigator Mary Filbert, [place] as Radiation Oncologist (Radiation Oncology) Mary Nab, RN as Oncology Nurse Navigator Mary [place] as Consulting Physician (Oncology)  ASSESSMENT & PLAN:   Cancer Staging  Cervical cancer [facility]) Staging form: Cervix Uteri, AJCC Version 9 - Clinical: FIGO Stage IIICr (cTX, cN1) - Unsigned  Primary lung adenocarcinoma (Mary Hoover) Staging form: Lung, AJCC 7th Edition - Clinical stage from 01/12/2021: T1, N1, M0 - Signed by Mary [place] on 01/29/2021   Primary lung adenocarcinoma (Mary Hoover) Stage III right lung adenocarcinoma,  Recurrent S/p concurrent chemotherapy and radiation- On immunotherapy durvalumab maintenance Labs reviewed and discussed with patient.  Proceed with Durvalumab treatments  Anemia in chronic kidney disease (CODE) Recommend patient to take Vitron C daily  Back pain Continue tramadol every 6 hours as needed.    CKD (chronic kidney disease) stage 3, GFR 30-59 ml/min (HCC) Monitor creatinine levels.  Avoid nephrotoxins. Encourage oral hydration.   Encounter for antineoplastic immunotherapy Treatment plan was listed above  Nausea without vomiting Intermittent chronic problem for patient. Continue Zofran and Compazine as needed.  Tobacco use Recommend smoke cessation.  No orders of the defined types were placed in this encounter. Repeat CT in January 2024. Follow up in 2 weeks.  Lab [place] Durvalumab.  All questions were answered. The patient knows to call the clinic with any problems, questions or concerns.  Mary Server, MD, PhD [facility] Hematology Oncology 01/18/2022     CHIEF COMPLAINTS/REASON FOR VISIT:  Follow up lung cancer treatments  HISTORY OF PRESENTING ILLNESS:  # Cervix cancer Patient has developed  postmenopausal vaginal bleeding and discharge.  She was noted to have a friable cervix/2 cm mass of the cervix, concerning for malignancy 12/19/2018 endometrial biopsy showed scattered atypical squamous cells suspicious for malignancy.  Predominantly necrosis with associated inflammation. 01/01/2019 initial cervix biopsy showed at least high-grade squamous intraepithelial lesion.-HPV negative 01/22/2019, repeat vaginal wall biopsy and cervix 9:00 biopsy showed squamous cell carcinoma.    Staging images 12/31/2018 showed fluid distending the endometrial canal and or endometrial thickening to the level of cervix.  No evidence of pathological lymphadenopathy.  Haziness about the lower cervix.  7 mm irregular pulmonary nodule within the right upper lobe, suspicious for possible primary or metastatic malignancy.  Chronic pleural-parenchymal scarring/fibrosis at the lung apices.  Large amount of stool.  No bowel obstruction. 01/08/2019 PET scan showed marked hypermetabolism in the region of the cervix, compatible with the reported history of cervical cancer. Small bilateral pelvic sidewall lymph nodes discernible FDG accumulation, concerning for metastatic disease although neither lymph node is enlarged by CT size criteria. 7 mm nodule in the right upper lobe shows discernible FDG accumulation.  Questionable neoplasm, primary versus metastatic. Emphysema.   #Chronic hearing loss  # Lung nodule, FDG avid, questionable primary bronchogenic carcinoma versus metastatic cancer.Discussed with radiation oncology.  Her case was also discussed on tumor board.  Consensus reached on finishing concurrent chemoradiation treatments for cervix cancer first. Possible SBRT to lung nodule for presumed primary lung cancer.  # 02/20/2020 - 03/19/2020 concurrent chemotherapy weekly carboplatin and taxol and radiation for treatment of this cervix cancer.   #December 2021 lung nodule,was evaluated by Dr.Oaks. Options of biopsy via  transthoracic or endobronchial approach vs surgical resection.  Patient opted to empiric SBRT  Patient was last seen by  me on 06/25/2019.  Lost follow-up. She continues to follow with radiation oncology. 11/20/2020, CT chest with contrast showed minimal residual of previously seen right upper lobe nodule.  0.6 x 0.4 cm.  Internal development of mild bandlike radiation fibrosis.  Newly enlarged pretracheal lymph node, measuring 2.5 x 1.7 cm.  Highly concerning for metastatic disease.  Unchanged prominent AP window and the right hilar lymph nodes.  Emphysema and diffuse bilateral bronchial wall thickening.  Coronary artery disease. 12/13/2020, PET scan showed enlarging hypermetabolic right lower paratracheal lymph node, compatible with metastatic disease.  SUV 10.3.  Mild FDG uptake of the bilateral sacral alla with associated lucency.  Concerning for sacral insufficiency fracture.  Stable posttreatment changes of the right upper lobe nodule.  Nonspecific small solid 3 mm pulmonary nodule of the left lower lobe.  2 small to be characterized.  No evidence of FDG avid metastatic disease in the abdomen or pelvis.  Aortic atherosclerosis and emphysema.  Patient reestablish care on 12/27/2020 for lung cancer treatments   #01/12/2021 patient underwent bronchoscopy biopsy by Dr. Patsey Berthold Right paratracheal lymph node fine-needle aspiration is positive for metastatic non-small cell carcinoma, favor adenocarcinoma of lung origin.  Circulogen NGS negative PD-L1, ALK, ROS1, NTRK1/2/3,   # 02/14/2021-03/31/2021, concurrent carboplatin AUC 2 and taxol 64m/m2 with radiation.  04/26/2021, CT chest with contrast showed evolving postradiation changes in the posterior perifissural upper right lung without discrete residual measurable nodule in this location.  Right hilar/mediastinal lymphadenopathy stable to decreased.  Tiny 0.2 cm left lower lobe pulmonary nodule slightly decreased.  Positive response to treatment.  No new  or progressive disease.  Three-vessel coronary arthrosclerosis.  Aortic atherosclerosis/emphysema.  INTERVAL HISTORY Mary ROBERTIis a 76y.o. female who has above history reviewed by me today presents for follow up visit for management of possible recurrent lung cancer.  History of cervix cancer.   She tolerates immunotherapy well.  Back pain is controlled with pain medication.  Chronic cough due to smoking. No new complaints.  Review of Systems  Constitutional:  Positive for fatigue. Negative for appetite change, chills, fever and unexpected weight change.  HENT:   Positive for hearing loss. Negative for voice change.   Eyes:  Negative for eye problems.  Respiratory:  Positive for cough. Negative for chest tightness.   Cardiovascular:  Negative for chest pain.  Gastrointestinal:  Positive for nausea. Negative for abdominal distention, abdominal pain and blood in stool.  Endocrine: Negative for hot flashes.  Genitourinary:  Negative for difficulty urinating and frequency.   Musculoskeletal:  Negative for arthralgias.  Skin:  Negative for itching and rash.  Neurological:  Negative for extremity weakness.  Hematological:  Negative for adenopathy.  Psychiatric/Behavioral:  Negative for confusion.     MEDICAL HISTORY:  Past Medical History:  Diagnosis Date   Arthritis    Cancer ([facility]    Carotid atherosclerosis    Carotid bruit    Cervical cancer (HCC)    COPD (chronic obstructive pulmonary disease) (HCC)    emphysema   Coronary artery disease    Family history of adverse reaction to anesthesia    paternal grandfather died during surgery-pt unaware what happened   GERD (gastroesophageal reflux disease)    Hyperlipidemia    Hypertension    Hyponatremia 02/27/2019   Hypothyroidism    Infected cat bite 1980s   was hospitalized   Irregular heartbeat    Medical history non-contributory    Peripheral vascular disease ([place]    Tobacco use  SURGICAL HISTORY: Past  Surgical History:  Procedure Laterality Date   CAROTID ENDARTERECTOMY Right Oct. 2015   Dr. Lucky Cowboy   CAROTID STENOSIS Left April 2016   carotid stenosis surgery   COLONOSCOPY WITH PROPOFOL N/A 11/08/2016   Procedure: COLONOSCOPY WITH PROPOFOL;  Surgeon: Jonathon Bellows, [place];  Location[facility] ENDOSCOPY;  Service: Gastroenterology;  Laterality: N/A;   ESOPHAGOGASTRODUODENOSCOPY (EGD) WITH PROPOFOL N/A 11/08/2016   Procedure: ESOPHAGOGASTRODUODENOSCOPY (EGD) WITH PROPOFOL;  Surgeon: Jonathon Bellows, [place];  Location: [facility] ENDOSCOPY;  Service: Gastroenterology;  Laterality: N/A;   FLEXIBLE BRONCHOSCOPY N/A 12/17/2014   Procedure: FLEXIBLE BRONCHOSCOPY;  Surgeon: Wilhelmina Mcardle, [place];  Location: [facility] ORS;  Service: Pulmonary;  Laterality: N/A;   INCISION AND DRAINAGE / EXCISION THYROGLOSSAL CYST  April 2011   PORTA CATH INSERTION N/A 03/03/2021   Procedure: PORTA CATH INSERTION;  Surgeon: Algernon Huxley, [place];  Location: [facility];  Service: Cardiovascular;  Laterality: N/A;   VIDEO BRONCHOSCOPY WITH ENDOBRONCHIAL ULTRASOUND N/A 01/12/2021   Procedure: VIDEO BRONCHOSCOPY WITH ENDOBRONCHIAL ULTRASOUND;  Surgeon: Mary Hoover Pita, [place];  Location: [facility] ORS;  Service: Cardiopulmonary;  Laterality: N/A;    SOCIAL HISTORY: Social History   Socioeconomic History   Marital status: Married    Spouse name: Mary Hoover   Number of children: Not on file   Years of education: Not on file   Highest education level: 9th grade  Occupational History   Occupation: retired   Tobacco Use   Smoking status: Every Day    Packs/day: 0.50    Years: 50.00    Total pack years: 25.00    Types: Cigarettes   Smokeless tobacco: Never   Tobacco comments:    0.5 PPD 02/10/2021  Vaping Use   Vaping Use: Former  Substance and Sexual Activity   Alcohol use: No    Alcohol/week: 0.0 standard drinks of alcohol   Drug use: No   Sexual activity: Yes  Other Topics Concern   Not on file  Social History Narrative   Lives at home with  husband   Social Determinants of Health   Financial Resource Strain: Low Risk  (03/15/2021)   Overall Financial Resource Strain (CARDIA)    Difficulty of Paying Living Expenses: Not hard at all  Food Insecurity: No Food Insecurity (03/15/2021)   Hunger Vital Sign    Worried About Running Out of Food in the Last Year: Never true    [place] in the Last Year: Never true  Transportation Needs: No Transportation Needs (03/15/2021)   PRAPARE - Hydrologist (Medical): No    Lack of Transportation (Non-Medical): No  Physical Activity: Inactive (03/15/2021)   Exercise Vital Sign    Days of Exercise per Week: 0 days    Minutes of Exercise per Session: 0 min  Stress: No Stress Concern Present (03/15/2021)   [place]    Feeling of Stress : Not at all  Social Connections: Moderately Isolated (03/15/2021)   Social Connection and Isolation Panel [NHANES]    Frequency of Communication with Friends and Family: More than three times a week    Frequency of Social Gatherings with Friends and Family: More than three times a week    Attends Religious Services: Never    Marine scientist or Organizations: No    Attends Archivist Meetings: Never    Marital Status: Married  Human resources officer Violence: Not At Risk (03/15/2021)  Humiliation, Afraid, Rape, and Kick questionnaire    Fear of Current or Ex-Partner: No    Emotionally Abused: No    Physically Abused: No    Sexually Abused: No    FAMILY HISTORY: Family History  Problem Relation Age of Onset   Congestive Heart Failure Mother    Heart disease Mother    Hypertension Mother    Hypertension Sister    Diabetes Sister    Heart disease Sister    Heart disease Maternal Uncle    Heart disease Maternal Grandmother    Stroke Maternal Grandfather    Cancer Brother        liver, lung   Heart disease Brother    Hypertension Brother     Heart attack Brother    COPD Neg Hx    Breast cancer Neg Hx     ALLERGIES:  is allergic to atorvastatin.  MEDICATIONS:  Current Outpatient Medications  Medication Sig Dispense Refill   acetaminophen (TYLENOL) 500 MG tablet Take 500 mg by mouth every 6 (six) hours as needed for moderate pain.     alendronate (FOSAMAX) 70 MG tablet Take 1 tablet (70 mg total) by mouth every 7 (seven) days. Take with a full glass of water on an empty stomach. 12 tablet 3   chlorhexidine (PERIDEX) 0.12 % solution Use as directed 5 mLs in the mouth or throat 2 (two) times daily. 120 mL 0   clopidogrel (PLAVIX) 75 MG tablet TAKE 1 TABLET EVERY DAY 90 tablet 1   cyclobenzaprine (FLEXERIL) 10 MG tablet TAKE 1 TABLET AT BEDTIME 90 tablet 1   Fluticasone-Umeclidin-Vilant (TRELEGY ELLIPTA) 100-62.5-25 MCG/INH AEPB Inhale 1 puff into the lungs daily. 1 each 11   gabapentin (NEURONTIN) 100 MG capsule TAKE 1 CAPSULE THREE TIMES DAILY 270 capsule 3   Iron-Vitamin C 65-125 MG TABS Take 1 tablet by mouth daily. 30 tablet 3   levothyroxine (SYNTHROID) 88 MCG tablet Take 1 tablet (88 mcg total) by mouth daily. 90 tablet 3   lidocaine (LIDODERM) 5 % Place 1 patch onto the skin daily. Remove & Discard patch within 12 hours or as directed by Hoover 90 patch 1   lidocaine-prilocaine (EMLA) cream Apply 1 application topically as needed. 60 g 3   lisinopril (ZESTRIL) 20 MG tablet TAKE 1 TABLET EVERY DAY 90 tablet 0   Multiple Vitamin (MULTIVITAMIN) tablet Take 1 tablet by mouth daily.     omeprazole (PRILOSEC) 20 MG capsule TAKE 1 CAPSULE EVERY DAY 90 capsule 2   ondansetron (ZOFRAN) 8 MG tablet Take 1 tablet (8 mg total) by mouth every 8 (eight) hours as needed for refractory nausea / vomiting. Start on day 3 after chemo. 30 tablet 1   prochlorperazine (COMPAZINE) 10 MG tablet Take 1 tablet (10 mg total) by mouth every 6 (six) hours as needed for nausea or vomiting. 30 tablet 0   traMADol (ULTRAM) 50 MG tablet Take 1-2 tablets  (50-100 mg total) by mouth every 12 (twelve) hours as needed. 60 tablet 0   vitamin B-12 (CYANOCOBALAMIN) 500 MCG tablet Take 500 mcg by mouth daily.     No current facility-administered medications for this visit.     PHYSICAL EXAMINATION: ECOG PERFORMANCE STATUS: 1 - Symptomatic but completely ambulatory Vitals:   01/18/22 1308  BP: (!) 100/57  Pulse: 100  Temp: 98.6 F (37 C)  SpO2: 100%    Filed Weights   01/18/22 1308  Weight: 129 lb 8 oz (58.7 kg)     Physical  Exam Constitutional:      General: She is not in acute distress. HENT:     Head: Normocephalic and atraumatic.  Eyes:     General: No scleral icterus.    Pupils: Pupils are equal, round, and reactive to light.  Cardiovascular:     Rate and Rhythm: Normal rate.  Pulmonary:     Effort: Pulmonary effort is normal. No respiratory distress.     Breath sounds: No wheezing.     Comments: Decreased breath sound bilaterally  Abdominal:     General: Bowel sounds are normal. There is no distension.     Palpations: Abdomen is soft.  Musculoskeletal:        General: No deformity. Normal range of motion.     Cervical back: Normal range of motion and neck supple.     Comments: Trace edema bilaterally  Skin:    General: Skin is warm and dry.  Neurological:     Mental Status: She is alert and oriented to person, place, and time. Mental status is at baseline.     Cranial Nerves: No cranial nerve deficit.     Coordination: Coordination normal.  Psychiatric:        Mood and Affect: Mood normal.      LABORATORY DATA:  I have reviewed the data as listed    Latest Ref Rng & Units 01/18/2022   12:41 PM 01/04/2022    8:11 AM 12/14/2021    8:37 AM  CBC  WBC 4.0 - 10.5 K/uL 8.0  6.9  5.7   Hemoglobin 12.0 - 15.0 g/dL 9.5  9.9  10.4   Hematocrit 36.0 - 46.0 % 28.6  29.6  31.8   Platelets 150 - 400 K/uL 345  363  290       Latest Ref Rng & Units 01/18/2022   12:41 PM 01/04/2022    8:11 AM 12/14/2021    8:37 AM   CMP  Glucose 70 - 99 mg/dL 97  140  119   BUN 8 - 23 mg/dL _0 Creatinine 0.44 - 1.00 mg/dL 1.44  1.08  1.12   Sodium 135 - 145 mmol/L 134  134  134   Potassium 3.5 - 5.1 mmol/L 4.7  4.3  3.9   Chloride 98 - 111 mmol/L 102  103  104   CO2 22 - 32 mmol/L _1 Calcium 8.9 - 10.3 mg/dL 9.0  8.7  8.8   Total Protein 6.5 - 8.1 g/dL 6.7  6.6  6.4   Total Bilirubin 0.3 - 1.2 mg/dL 0.4  0.4  0.5   Alkaline Phos 38 - 126 U/L 74  105  61   AST 15 - 41 U/L _2 ALT 0 - 44 U/L _3 Iron/TIBC/Ferritin/ %Sat    Component Value Date/Time   IRON 90 05/04/2021 0929   TIBC 321 05/04/2021 0929   FERRITIN 52 05/04/2021 0929   IRONPCTSAT 28 05/04/2021 0929      RADIOGRAPHIC STUDIES: I have personally reviewed the radiological images as listed and agreed with the findings in the report. CT CHEST ABDOMEN PELVIS W CONTRAST  Result Date: 10/26/2021 CLINICAL DATA:  A 76 year old female with history of lung and cervical cancer presents for follow-up. * Tracking Code: BO * EXAM: CT CHEST, ABDOMEN, AND PELVIS WITH CONTRAST TECHNIQUE: Multidetector CT imaging of the chest, abdomen and pelvis  was performed following the standard protocol during bolus administration of intravenous contrast. RADIATION DOSE REDUCTION: This exam was performed according to the departmental dose-optimization program which includes automated exposure control, adjustment of the mA and/or kV according to patient size and/or use of iterative reconstruction technique. CONTRAST:  122m OMNIPAQUE IOHEXOL 300 MG/ML  SOLN COMPARISON:  July 25, 2021 FINDINGS: CT CHEST FINDINGS Cardiovascular: LEFT-sided Port-A-Cath terminates in the upper RIGHT atrium. Calcified and noncalcified aortic atherosclerotic plaque no pericardial effusion or thickening. Central pulmonary vasculature is normal caliber. Mediastinum/Nodes: No adenopathy in the chest. Mild thickening of the esophagus is unchanged likely relating to post  treatment changes. Stable 9 mm RIGHT hilar lymph node. Lungs/Pleura: Pulmonary emphysema as before. Bandlike consolidative changes in the posterior RIGHT upper lobe appear more organized at the periphery. No focal masslike characteristics. Stable granuloma at the RIGHT lung base with calcification. No consolidation or sign of pleural effusion. Nodule in the LEFT lower lobe a 4 mm may be very slowly increasing in size over time, certainly as compared to March of 2023. (Image 92/3) Dependent pleural thickening along the posterior RIGHT chest is stable some of which could reflect post treatment changes particularly in the upper chest along the plane of post radiation changes. Small amounts material is seen in the dependent trachea and RIGHT mainstem bronchus not changed from previous imaging. Musculoskeletal: No chest wall mass, see below for full musculoskeletal details. CT ABDOMEN PELVIS FINDINGS Hepatobiliary: Lobular hepatic contours. Portal vein is patent. No pericholecystic stranding or biliary duct dilation. No suspicious hepatic lesion. Pancreas: Normal, without mass, inflammation or ductal dilatation. Spleen: Normal. Adrenals/Urinary Tract: Adrenal glands are unremarkable. Symmetric renal enhancement. No sign of hydronephrosis. No suspicious renal lesion or perinephric stranding. Stable cyst in the posterior interpolar RIGHT kidney measuring 12 x 8 mm. No dedicated imaging follow-up is recommended for this finding. Urinary bladder is grossly unremarkable. Stomach/Bowel: No perigastric stranding. No sign of small bowel obstruction or acute colonic process. Appendix is normal. Vascular/Lymphatic: Aortic atherosclerosis. No sign of aneurysm. Smooth contour of the IVC. There is no gastrohepatic or hepatoduodenal ligament lymphadenopathy. No retroperitoneal or mesenteric lymphadenopathy. No pelvic sidewall lymphadenopathy. Atherosclerotic changes are moderate to marked both calcified and noncalcified plaque present  in the abdominal aorta. Reproductive: Unremarkable by CT. Other: No ascites. Musculoskeletal: Sclerosis of the sacrum with similar pattern of sclerosis compared to prior imaging from November of 2022. Signs of sacral insufficiency fracture visible along the RIGHT and LEFT sacral Hoover also similar to November of 2022. No destructive bone finding. No acute bone process. Spinal degenerative changes. IMPRESSION: 1. Bandlike consolidative changes in the posterior RIGHT upper lobe appear more organized at the periphery. No focal masslike characteristics. Findings are compatible with evolving post treatment changes. Findings are favored to represent evolving post treatment changes. 2. Nodule in the LEFT lower lobe a 4 mm may be very slowly increasing in size over time, certainly as compared to March of 2023. Continued attention on follow-up is suggested. 3. Stable 9 mm RIGHT hilar lymph node. 4. Evidence of sacral insufficiency fractures not substantially changed. 5. Pulmonary emphysema and aortic atherosclerosis. Electronically Signed   By: GZetta BillsM.D.   On: 10/26/2021 11:47

## 2022-01-19 NOTE — Assessment & Plan Note (Signed)
Treatment plan was listed above

## 2022-01-25 ENCOUNTER — Ambulatory Visit: Payer: Medicare PPO | Admitting: Oncology

## 2022-01-25 ENCOUNTER — Ambulatory Visit: Payer: Medicare PPO

## 2022-01-25 ENCOUNTER — Other Ambulatory Visit: Payer: Medicare PPO

## 2022-01-30 ENCOUNTER — Other Ambulatory Visit: Payer: Self-pay | Admitting: Family Medicine

## 2022-01-31 ENCOUNTER — Other Ambulatory Visit: Payer: Self-pay | Admitting: Family Medicine

## 2022-01-31 ENCOUNTER — Ambulatory Visit
Admission: RE | Admit: 2022-01-31 | Discharge: 2022-01-31 | Disposition: A | Discharge status: Home / Self Care | Payer: Medicare PPO | Source: Ambulatory Visit | Attending: Oncology | Admitting: Oncology

## 2022-01-31 DIAGNOSIS — J181 Lobar pneumonia, unspecified organism: Secondary | ICD-10-CM | POA: Diagnosis not present

## 2022-01-31 DIAGNOSIS — C3491 Malignant neoplasm of unspecified part of right bronchus or lung: Secondary | ICD-10-CM

## 2022-01-31 DIAGNOSIS — J439 Emphysema, unspecified: Secondary | ICD-10-CM | POA: Diagnosis not present

## 2022-01-31 DIAGNOSIS — C3490 Malignant neoplasm of unspecified part of unspecified bronchus or lung: Secondary | ICD-10-CM | POA: Diagnosis not present

## 2022-01-31 HISTORY — DX: Disorder of kidney and ureter, unspecified: N28.9

## 2022-01-31 MED ORDER — IOHEXOL 300 MG/ML  SOLN
75.0000 mL | Freq: Once | INTRAMUSCULAR | Status: AC | PRN
  Administered 2022-01-31: 60 mL via INTRAVENOUS

## 2022-02-01 ENCOUNTER — Inpatient Hospital Stay (HOSPITAL_BASED_OUTPATIENT_CLINIC_OR_DEPARTMENT_OTHER): Payer: Medicare PPO | Admitting: Oncology

## 2022-02-01 ENCOUNTER — Inpatient Hospital Stay: Payer: Medicare PPO | Attending: Oncology

## 2022-02-01 ENCOUNTER — Encounter: Payer: Self-pay | Admitting: Oncology

## 2022-02-01 ENCOUNTER — Inpatient Hospital Stay: Payer: Medicare PPO

## 2022-02-01 VITALS — BP 98/55 | HR 91 | Temp 97.3°F | Wt 129.0 lb

## 2022-02-01 DIAGNOSIS — R634 Abnormal weight loss: Secondary | ICD-10-CM

## 2022-02-01 DIAGNOSIS — G8929 Other chronic pain: Secondary | ICD-10-CM | POA: Diagnosis not present

## 2022-02-01 DIAGNOSIS — C3491 Malignant neoplasm of unspecified part of right bronchus or lung: Secondary | ICD-10-CM | POA: Diagnosis not present

## 2022-02-01 DIAGNOSIS — D631 Anemia in chronic kidney disease: Secondary | ICD-10-CM | POA: Diagnosis not present

## 2022-02-01 DIAGNOSIS — Z79899 Other long term (current) drug therapy: Secondary | ICD-10-CM | POA: Diagnosis not present

## 2022-02-01 DIAGNOSIS — M545 Low back pain, unspecified: Secondary | ICD-10-CM

## 2022-02-01 DIAGNOSIS — I129 Hypertensive chronic kidney disease with stage 1 through stage 4 chronic kidney disease, or unspecified chronic kidney disease: Secondary | ICD-10-CM | POA: Insufficient documentation

## 2022-02-01 DIAGNOSIS — Z5112 Encounter for antineoplastic immunotherapy: Secondary | ICD-10-CM | POA: Insufficient documentation

## 2022-02-01 DIAGNOSIS — Z720 Tobacco use: Secondary | ICD-10-CM

## 2022-02-01 DIAGNOSIS — R110 Nausea: Secondary | ICD-10-CM

## 2022-02-01 DIAGNOSIS — N1831 Chronic kidney disease, stage 3a: Secondary | ICD-10-CM | POA: Diagnosis not present

## 2022-02-01 LAB — COMPREHENSIVE METABOLIC PANEL
ALT: 9 U/L (ref 0–44)
AST: 17 U/L (ref 15–41)
Albumin: 3.4 g/dL — ABNORMAL LOW (ref 3.5–5.0)
Alkaline Phosphatase: 65 U/L (ref 38–126)
Anion gap: 9 (ref 5–15)
BUN: 16 mg/dL (ref 8–23)
CO2: 23 mmol/L (ref 22–32)
Calcium: 8.7 mg/dL — ABNORMAL LOW (ref 8.9–10.3)
Chloride: 101 mmol/L (ref 98–111)
Creatinine, Ser: 1.46 mg/dL — ABNORMAL HIGH (ref 0.44–1.00)
GFR, Estimated: 38 mL/min — ABNORMAL LOW (ref >60)
Glucose, Bld: 95 mg/dL (ref 70–99)
Potassium: 4.6 mmol/L (ref 3.5–5.1)
Sodium: 133 mmol/L — ABNORMAL LOW (ref 135–145)
Total Bilirubin: 0.4 mg/dL (ref 0.3–1.2)
Total Protein: 6.9 g/dL (ref 6.5–8.1)

## 2022-02-01 LAB — CBC WITH DIFFERENTIAL/PLATELET
Abs Immature Granulocytes: 0.03 10*3/uL (ref 0.00–0.07)
Basophils Absolute: 0 10*3/uL (ref 0.0–0.1)
Basophils Relative: 0 %
Eosinophils Absolute: 0.2 10*3/uL (ref 0.0–0.5)
Eosinophils Relative: 3 %
HCT: 27.1 % — ABNORMAL LOW (ref 36.0–46.0)
Hemoglobin: 9.3 g/dL — ABNORMAL LOW (ref 12.0–15.0)
Immature Granulocytes: 1 %
Lymphocytes Relative: 9 %
Lymphs Abs: 0.6 10*3/uL — ABNORMAL LOW (ref 0.7–4.0)
MCH: 32 pg (ref 26.0–34.0)
MCHC: 34.3 g/dL (ref 30.0–36.0)
MCV: 93.1 fL (ref 80.0–100.0)
Monocytes Absolute: 0.5 10*3/uL (ref 0.1–1.0)
Monocytes Relative: 7 %
Neutro Abs: 4.9 10*3/uL (ref 1.7–7.7)
Neutrophils Relative %: 80 %
Platelets: 302 10*3/uL (ref 150–400)
RBC: 2.91 MIL/uL — ABNORMAL LOW (ref 3.87–5.11)
RDW: 13.2 % (ref 11.5–15.5)
WBC: 6.2 10*3/uL (ref 4.0–10.5)
nRBC: 0 % (ref 0.0–0.2)

## 2022-02-01 LAB — T4, FREE: Free T4: 0.91 ng/dL (ref 0.61–1.12)

## 2022-02-01 LAB — TSH: TSH: 5.089 u[IU]/mL — ABNORMAL HIGH (ref 0.350–4.500)

## 2022-02-01 MED ORDER — SODIUM CHLORIDE 0.9 % IV SOLN
10.0000 mg/kg | Freq: Once | INTRAVENOUS | Status: AC
  Administered 2022-02-01: 620 mg via INTRAVENOUS
  Filled 2022-02-01: qty 10

## 2022-02-01 MED ORDER — SODIUM CHLORIDE 0.9 % IV SOLN
Freq: Once | INTRAVENOUS | Status: AC
  Filled 2022-02-01: qty 250

## 2022-02-01 MED ORDER — HEPARIN SOD (PORK) LOCK FLUSH 100 UNIT/ML IV SOLN
500.0000 [IU] | Freq: Once | INTRAVENOUS | Status: AC | PRN
  Administered 2022-02-01: 500 [IU]
  Filled 2022-02-01: qty 5

## 2022-02-01 NOTE — Progress Notes (Signed)
Hematology/Oncology Progress note [phone] [phone]      Patient Care Team: [name], DO as PCP - General (Family Medicine) Clent Jacks, RN as Oncology Nurse Navigator Noreene Filbert, MD as Radiation Oncologist (Radiation Oncology) Telford Nab, RN as Oncology Nurse Navigator [name], MD as Consulting Physician (Oncology)  ASSESSMENT & PLAN:   Cancer Staging  Cervical cancer [facility]) Staging form: Cervix Uteri, AJCC Version 9 - Clinical: FIGO Stage IIICr (cTX, cN1) - Unsigned  Primary lung adenocarcinoma ([place]) Staging form: Lung, AJCC 7th Edition - Clinical stage from 01/12/2021: T1, N1, M0 - Signed by [name], MD on 01/29/2021   Primary lung adenocarcinoma ([place]) Stage III right lung adenocarcinoma,  Recurrent S/p concurrent chemotherapy and radiation- On immunotherapy durvalumab maintenance Labs reviewed and discussed with patient.  Proceed with Durvalumab treatments Surveillance CT was done yesterday result is pending.  Encounter for antineoplastic immunotherapy Treatment plan was listed above  Weight loss Patient declined nutritionist evaluation. Continue nutrition supplement   Tobacco use Recommend smoke cessation.  Nausea without vomiting Intermittent chronic problem for patient. Continue Zofran and Compazine as needed.  CKD (chronic kidney disease) stage 3, GFR 30-59 ml/min (HCC) Increased creatinine, patient will receive 1 L of normal saline for IV hydration.   Contrast study recently Avoid nephrotoxins. Encourage oral hydration.    Back pain Continue tramadol every 6 hours as needed.    Anemia in chronic kidney disease (CODE) Recommend patient to take Vitron C daily  Orders Placed This Encounter  Procedures   CBC with Differential    Standing Status:   Future    Standing Expiration Date:   03/02/2023   Comprehensive metabolic panel    Standing Status:   Future    Standing Expiration Date:   03/02/2023    Repeat Follow up in 2 weeks.  Lab MD Durvalumab.  All questions were answered. The patient knows to call the clinic with any problems, questions or concerns.  [name], MD, PhD [facility] Hematology Oncology 02/01/2022     CHIEF COMPLAINTS/REASON FOR VISIT:  Follow up lung cancer treatments  HISTORY OF PRESENTING ILLNESS:  # Cervix cancer Patient has developed postmenopausal vaginal bleeding and discharge.  She was noted to have a friable cervix/2 cm mass of the cervix, concerning for malignancy 12/19/2018 endometrial biopsy showed scattered atypical squamous cells suspicious for malignancy.  Predominantly necrosis with associated inflammation. 01/01/2019 initial cervix biopsy showed at least high-grade squamous intraepithelial lesion.-HPV negative 01/22/2019, repeat vaginal wall biopsy and cervix 9:00 biopsy showed squamous cell carcinoma.    Staging images 12/31/2018 showed fluid distending the endometrial canal and or endometrial thickening to the level of cervix.  No evidence of pathological lymphadenopathy.  Haziness about the lower cervix.  7 mm irregular pulmonary nodule within the right upper lobe, suspicious for possible primary or metastatic malignancy.  Chronic pleural-parenchymal scarring/fibrosis at the lung apices.  Large amount of stool.  No bowel obstruction. 01/08/2019 PET scan showed marked hypermetabolism in the region of the cervix, compatible with the reported history of cervical cancer. Small bilateral pelvic sidewall lymph nodes discernible FDG accumulation, concerning for metastatic disease although neither lymph node is enlarged by CT size criteria. 7 mm nodule in the right upper lobe shows discernible FDG accumulation.  Questionable neoplasm, primary versus metastatic. Emphysema.   #Chronic hearing loss  # Lung nodule, FDG avid, questionable primary bronchogenic carcinoma versus metastatic cancer.Discussed with radiation oncology.  Her case was also discussed  on tumor board.  Consensus  reached on finishing concurrent chemoradiation treatments for cervix cancer first. Possible SBRT to lung nodule for presumed primary lung cancer.  # 02/20/2020 - 03/19/2020 concurrent chemotherapy weekly carboplatin and taxol and radiation for treatment of this cervix cancer.   #December 2021 lung nodule,was evaluated by Dr.Oaks. Options of biopsy via transthoracic or endobronchial approach vs surgical resection.  Patient opted to empiric SBRT  Patient was last seen by me on 06/25/2019.  Lost follow-up. She continues to follow with radiation oncology. 11/20/2020, CT chest with contrast showed minimal residual of previously seen right upper lobe nodule.  0.6 x 0.4 cm.  Internal development of mild bandlike radiation fibrosis.  Newly enlarged pretracheal lymph node, measuring 2.5 x 1.7 cm.  Highly concerning for metastatic disease.  Unchanged prominent AP window and the right hilar lymph nodes.  Emphysema and diffuse bilateral bronchial wall thickening.  Coronary artery disease. 12/13/2020, PET scan showed enlarging hypermetabolic right lower paratracheal lymph node, compatible with metastatic disease.  SUV 10.3.  Mild FDG uptake of the bilateral sacral alla with associated lucency.  Concerning for sacral insufficiency fracture.  Stable posttreatment changes of the right upper lobe nodule.  Nonspecific small solid 3 mm pulmonary nodule of the left lower lobe.  2 small to be characterized.  No evidence of FDG avid metastatic disease in the abdomen or pelvis.  Aortic atherosclerosis and emphysema.  Patient reestablish care on 12/27/2020 for lung cancer treatments   #01/12/2021 patient underwent bronchoscopy biopsy by Dr. Patsey Berthold Right paratracheal lymph node fine-needle aspiration is positive for metastatic non-small cell carcinoma, favor adenocarcinoma of lung origin.  Circulogen NGS negative PD-L1, ALK, ROS1, NTRK1/2/3,   # 02/14/2021-03/31/2021, concurrent carboplatin AUC 2 and  taxol 67m/m2 with radiation.  04/26/2021, CT chest with contrast showed evolving postradiation changes in the posterior perifissural upper right lung without discrete residual measurable nodule in this location.  Right hilar/mediastinal lymphadenopathy stable to decreased.  Tiny 0.2 cm left lower lobe pulmonary nodule slightly decreased.  Positive response to treatment.  No new or progressive disease.  Three-vessel coronary arthrosclerosis.  Aortic atherosclerosis/emphysema.  INTERVAL HISTORY JJAMIN HUMPHRIESis a 77y.o. female who has above history reviewed by me today presents for follow up visit for management of possible recurrent lung cancer.  History of cervix cancer.   She tolerates immunotherapy well.  Back pain is controlled with pain medication.  Nausea has improved after utilizing Zofran as needed. Chronic cough due to smoking. No new complaints.  Review of Systems  Constitutional:  Positive for fatigue. Negative for appetite change, chills, fever and unexpected weight change.  HENT:   Positive for hearing loss. Negative for voice change.   Eyes:  Negative for eye problems.  Respiratory:  Positive for cough. Negative for chest tightness.   Cardiovascular:  Negative for chest pain.  Gastrointestinal:  Positive for nausea. Negative for abdominal distention, abdominal pain and blood in stool.  Endocrine: Negative for hot flashes.  Genitourinary:  Negative for difficulty urinating and frequency.   Musculoskeletal:  Negative for arthralgias.  Skin:  Negative for itching and rash.  Neurological:  Negative for extremity weakness.  Hematological:  Negative for adenopathy.  Psychiatric/Behavioral:  Negative for confusion.     MEDICAL HISTORY:  Past Medical History:  Diagnosis Date   Arthritis    Cancer ([facility]    Carotid atherosclerosis    Carotid bruit    Cervical cancer (HCC)    COPD (chronic obstructive pulmonary disease) (HCC)    emphysema   Coronary artery  disease     Family history of adverse reaction to anesthesia    paternal grandfather died during surgery-pt unaware what happened   GERD (gastroesophageal reflux disease)    Hyperlipidemia    Hypertension    Hyponatremia 02/27/2019   Hypothyroidism    Infected cat bite 1980s   was hospitalized   Irregular heartbeat    Medical history non-contributory    Peripheral vascular disease ([place])    Renal insufficiency    Tobacco use     SURGICAL HISTORY: Past Surgical History:  Procedure Laterality Date   CAROTID ENDARTERECTOMY Right Oct. 2015   Dr. Lucky Cowboy   CAROTID STENOSIS Left April 2016   carotid stenosis surgery   COLONOSCOPY WITH PROPOFOL N/A 11/08/2016   Procedure: COLONOSCOPY WITH PROPOFOL;  Surgeon: Jonathon Bellows, MD;  [facility] ENDOSCOPY;  Service: Gastroenterology;  Laterality: N/A;   ESOPHAGOGASTRODUODENOSCOPY (EGD) WITH PROPOFOL N/A 11/08/2016   Procedure: ESOPHAGOGASTRODUODENOSCOPY (EGD) WITH PROPOFOL;  Surgeon: Jonathon Bellows, MD;  Location: [facility] ENDOSCOPY;  Service: Gastroenterology;  Laterality: N/A;   FLEXIBLE BRONCHOSCOPY N/A 12/17/2014   Procedure: FLEXIBLE BRONCHOSCOPY;  Surgeon: Wilhelmina Mcardle, MD;  Location: [facility] ORS;  Service: Pulmonary;  Laterality: N/A;   INCISION AND DRAINAGE / EXCISION THYROGLOSSAL CYST  April 2011   PORTA CATH INSERTION N/A 03/03/2021   Procedure: PORTA CATH INSERTION;  Surgeon: Algernon Huxley, MD;  Location: [facility];  Service: Cardiovascular;  Laterality: N/A;   VIDEO BRONCHOSCOPY WITH ENDOBRONCHIAL ULTRASOUND N/A 01/12/2021   Procedure: VIDEO BRONCHOSCOPY WITH ENDOBRONCHIAL ULTRASOUND;  Surgeon: Tyler Pita, MD;  Location: [facility] ORS;  Service: Cardiopulmonary;  Laterality: N/A;    SOCIAL HISTORY: Social History   Socioeconomic History   Marital status: Married    Spouse name: Deidre Ala   Number of children: Not on file   Years of education: Not on file   Highest education level: 9th grade  Occupational History   Occupation: retired    Tobacco Use   Smoking status: Every Day    Packs/day: 0.50    Years: 50.00    Total pack years: 25.00    Types: Cigarettes   Smokeless tobacco: Never   Tobacco comments:    0.5 PPD 02/10/2021  Vaping Use   Vaping Use: Former  Substance and Sexual Activity   Alcohol use: No    Alcohol/week: 0.0 standard drinks of alcohol   Drug use: No   Sexual activity: Yes  Other Topics Concern   Not on file  Social History Narrative   Lives at home with husband   Social Determinants of Health   Financial Resource Strain: Low Risk  (03/15/2021)   Overall Financial Resource Strain (CARDIA)    Difficulty of Paying Living Expenses: Not hard at all  Food Insecurity: No Food Insecurity (03/15/2021)   Hunger Vital Sign    Worried About Running Out of Food in the Last Year: Never true    [place] in the Last Year: Never true  Transportation Needs: No Transportation Needs (03/15/2021)   PRAPARE - Hydrologist (Medical): No    Lack of Transportation (Non-Medical): No  Physical Activity: Inactive (03/15/2021)   Exercise Vital Sign    Days of Exercise per Week: 0 days    Minutes of Exercise per Session: 0 min  Stress: No Stress Concern Present (03/15/2021)   [place]    Feeling of Stress : Not at all  Social Connections:  Moderately Isolated (03/15/2021)   Social Connection and Isolation Panel [NHANES]    Frequency of Communication with Friends and Family: More than three times a week    Frequency of Social Gatherings with Friends and Family: More than three times a week    Attends Religious Services: Never    Marine scientist or Organizations: No    Attends Archivist Meetings: Never    Marital Status: Married  Human resources officer Violence: Not At Risk (03/15/2021)   Humiliation, Afraid, Rape, and Kick questionnaire    Fear of Current or Ex-Partner: No    Emotionally Abused: No     Physically Abused: No    Sexually Abused: No    FAMILY HISTORY: Family History  Problem Relation Age of Onset   Congestive Heart Failure Mother    Heart disease Mother    Hypertension Mother    Hypertension Sister    Diabetes Sister    Heart disease Sister    Heart disease Maternal Uncle    Heart disease Maternal Grandmother    Stroke Maternal Grandfather    Cancer Brother        liver, lung   Heart disease Brother    Hypertension Brother    Heart attack Brother    COPD Neg Hx    Breast cancer Neg Hx     ALLERGIES:  is allergic to atorvastatin.  MEDICATIONS:  Current Outpatient Medications  Medication Sig Dispense Refill   acetaminophen (TYLENOL) 500 MG tablet Take 500 mg by mouth every 6 (six) hours as needed for moderate pain.     alendronate (FOSAMAX) 70 MG tablet Take 1 tablet (70 mg total) by mouth every 7 (seven) days. Take with a full glass of water on an empty stomach. 12 tablet 3   chlorhexidine (PERIDEX) 0.12 % solution Use as directed 5 mLs in the mouth or throat 2 (two) times daily. 120 mL 0   clopidogrel (PLAVIX) 75 MG tablet TAKE 1 TABLET EVERY DAY 90 tablet 1   cyclobenzaprine (FLEXERIL) 10 MG tablet TAKE 1 TABLET AT BEDTIME 90 tablet 1   Fluticasone-Umeclidin-Vilant (TRELEGY ELLIPTA) 100-62.5-25 MCG/INH AEPB Inhale 1 puff into the lungs daily. 1 each 11   gabapentin (NEURONTIN) 100 MG capsule TAKE 1 CAPSULE THREE TIMES DAILY 270 capsule 3   Iron-Vitamin C 65-125 MG TABS Take 1 tablet by mouth daily. 30 tablet 3   levothyroxine (SYNTHROID) 88 MCG tablet Take 1 tablet (88 mcg total) by mouth daily. 90 tablet 3   lidocaine (LIDODERM) 5 % Place 1 patch onto the skin daily. Remove & Discard patch within 12 hours or as directed by MD 90 patch 1   lidocaine-prilocaine (EMLA) cream Apply 1 application topically as needed. 60 g 3   lisinopril (ZESTRIL) 20 MG tablet TAKE 1 TABLET EVERY DAY 90 tablet 0   Multiple Vitamin (MULTIVITAMIN) tablet Take 1 tablet by mouth  daily.     omeprazole (PRILOSEC) 20 MG capsule TAKE 1 CAPSULE EVERY DAY 90 capsule 3   ondansetron (ZOFRAN) 8 MG tablet Take 1 tablet (8 mg total) by mouth every 8 (eight) hours as needed for refractory nausea / vomiting. Start on day 3 after chemo. 30 tablet 1   prochlorperazine (COMPAZINE) 10 MG tablet Take 1 tablet (10 mg total) by mouth every 6 (six) hours as needed for nausea or vomiting. 30 tablet 0   traMADol (ULTRAM) 50 MG tablet Take 1-2 tablets (50-100 mg total) by mouth every 12 (twelve) hours as needed.  60 tablet 0   vitamin B-12 (CYANOCOBALAMIN) 500 MCG tablet Take 500 mcg by mouth daily.     No current facility-administered medications for this visit.     PHYSICAL EXAMINATION: ECOG PERFORMANCE STATUS: 1 - Symptomatic but completely ambulatory Vitals:   02/01/22 1305  BP: (!) 98/55  Pulse: 91  Temp: (!) 97.3 F (36.3 C)  SpO2: 100%    Filed Weights   02/01/22 1305  Weight: 129 lb (58.5 kg)     Physical Exam Constitutional:      General: She is not in acute distress. HENT:     Head: Normocephalic and atraumatic.  Eyes:     General: No scleral icterus.    Pupils: Pupils are equal, round, and reactive to light.  Cardiovascular:     Rate and Rhythm: Normal rate.  Pulmonary:     Effort: Pulmonary effort is normal. No respiratory distress.     Breath sounds: No wheezing.     Comments: Decreased breath sound bilaterally  Abdominal:     General: Bowel sounds are normal. There is no distension.     Palpations: Abdomen is soft.  Musculoskeletal:        General: No deformity. Normal range of motion.     Cervical back: Normal range of motion and neck supple.     Comments: Trace edema bilaterally  Skin:    General: Skin is warm and dry.  Neurological:     Mental Status: She is alert and oriented to person, place, and time. Mental status is at baseline.     Cranial Nerves: No cranial nerve deficit.     Coordination: Coordination normal.  Psychiatric:        Mood  and Affect: Mood normal.      LABORATORY DATA:  I have reviewed the data as listed    Latest Ref Rng & Units 02/01/2022   12:43 PM 01/18/2022   12:41 PM 01/04/2022    8:11 AM  CBC  WBC 4.0 - 10.5 K/uL 6.2  8.0  6.9   Hemoglobin 12.0 - 15.0 g/dL 9.3  9.5  9.9   Hematocrit 36.0 - 46.0 % 27.1  28.6  29.6   Platelets 150 - 400 K/uL 302  345  363       Latest Ref Rng & Units 02/01/2022   12:43 PM 01/18/2022   12:41 PM 01/04/2022    8:11 AM  CMP  Glucose 70 - 99 mg/dL 95  97  140   BUN 8 - 23 mg/dL _0 Creatinine 0.44 - 1.00 mg/dL 1.46  1.44  1.08   Sodium 135 - 145 mmol/L 133  134  134   Potassium 3.5 - 5.1 mmol/L 4.6  4.7  4.3   Chloride 98 - 111 mmol/L 101  102  103   CO2 22 - 32 mmol/L _1 Calcium 8.9 - 10.3 mg/dL 8.7  9.0  8.7   Total Protein 6.5 - 8.1 g/dL 6.9  6.7  6.6   Total Bilirubin 0.3 - 1.2 mg/dL 0.4  0.4  0.4   Alkaline Phos 38 - 126 U/L 65  74  105   AST 15 - 41 U/L _2 ALT 0 - 44 U/L _3 Iron/TIBC/Ferritin/ %Sat    Component Value Date/Time   IRON 90 05/04/2021 0929   TIBC 321 05/04/2021 0929  FERRITIN 52 05/04/2021 0929   IRONPCTSAT 28 05/04/2021 0929      RADIOGRAPHIC STUDIES: I have personally reviewed the radiological images as listed and agreed with the findings in the report. No results found.

## 2022-02-01 NOTE — Assessment & Plan Note (Addendum)
Stage III right lung adenocarcinoma,  Recurrent S/p concurrent chemotherapy and radiation- On immunotherapy durvalumab maintenance Labs reviewed and discussed with patient.  Proceed with Durvalumab treatments Surveillance CT was done yesterday result is pending.

## 2022-02-01 NOTE — Telephone Encounter (Signed)
Requested Prescriptions  Pending Prescriptions Disp Refills   omeprazole (PRILOSEC) 20 MG capsule [Pharmacy Med Name: OMEPRAZOLE 20 MG Capsule Delayed Release] 90 capsule 3    Sig: TAKE [place] DAY     Gastroenterology: Proton Pump Inhibitors Passed - 01/31/2022  1:05 AM      Passed - Valid encounter within last 12 months    Recent Outpatient Visits           2 months ago Cutaneous abscess of neck   [facility] Mecum, Erin E, PA-C   10 months ago LUQ pain   [place], [place], DO   1 year ago Essential hypertension   [place], [place], DO   1 year ago Essential hypertension   [place], [place], DO   1 year ago [place], [place], DO

## 2022-02-01 NOTE — Assessment & Plan Note (Signed)
Patient declined nutritionist evaluation. Continue nutrition supplement

## 2022-02-01 NOTE — Assessment & Plan Note (Signed)
Intermittent chronic problem for patient. Continue Zofran and Compazine as needed.

## 2022-02-01 NOTE — Assessment & Plan Note (Signed)
Continue tramadol every 6 hours as needed.

## 2022-02-01 NOTE — Assessment & Plan Note (Signed)
Recommend patient to take Vitron C daily

## 2022-02-01 NOTE — Assessment & Plan Note (Signed)
Increased creatinine, patient will receive 1 L of normal saline for IV hydration.   Contrast study recently Avoid nephrotoxins. Encourage oral hydration.

## 2022-02-01 NOTE — Assessment & Plan Note (Signed)
Recommend smoke cessation.  

## 2022-02-01 NOTE — Assessment & Plan Note (Signed)
Treatment plan was listed above

## 2022-02-01 NOTE — Telephone Encounter (Signed)
Requested medication (s) are due for refill today:   Yes  Requested medication (s) are on the active medication list:   Yes  Future visit scheduled:   No   LOV 03/25/2021   Last ordered: 02/11/2021 #12, 3 refills  Returned because labs are due.     Requested Prescriptions  Pending Prescriptions Disp Refills   alendronate (FOSAMAX) 70 MG tablet [Pharmacy Med Name: ALENDRONATE SODIUM 70 MG Tablet] 12 tablet 3    Sig: TAKE 1 TABLET EVERY 7 DAYS . TAKE WITH A FULL GLASS OF WATER ON AN EMPTY STOMACH.     Endocrinology:  Bisphosphonates Failed - 01/30/2022  4:23 PM      Failed - Vitamin D in normal range and within 360 days    No results found for: "OV5643PI9", "JJ8841YS0", "[id number]", "25OHVITD3", "25OHVITD2", "25OHVITD1", "VD25OH"       Failed - Cr in normal range and within 360 days    Creatinine  Date Value Ref Range Status  05/14/2014 0.78 mg/dL Final    Comment:    0.44-1.00 NOTE: New Reference Range  04/07/14    Creatinine, Ser  Date Value Ref Range Status  01/18/2022 1.44 (H) 0.44 - 1.00 mg/dL Final         Failed - Mg Level in normal range and within 360 days    No results found for: "MG"       Failed - Phosphate in normal range and within 360 days    No results found for: "PHOS"       Passed - Ca in normal range and within 360 days    Calcium  Date Value Ref Range Status  01/18/2022 9.0 8.9 - 10.3 mg/dL Final   Calcium, Total  Date Value Ref Range Status  05/14/2014 8.7 (L) mg/dL Final    Comment:    8.9-10.3 NOTE: New Reference Range  04/07/14          Passed - eGFR is 30 or above and within 360 days    EGFR (African American)  Date Value Ref Range Status  05/14/2014 >60  Final   GFR calc Af Amer  Date Value Ref Range Status  11/14/2019 64 >59 mL/min/1.73 Final    Comment:    **In accordance with recommendations from the NKF-ASN Task force,**   [facility] is in the process of updating its eGFR calculation to the   2021 CKD-EPI creatinine equation  that estimates kidney function   without a race variable.    EGFR (Non-African Amer.)  Date Value Ref Range Status  05/14/2014 >60  Final    Comment:    eGFR values <85m/min/1.73 m2 may be an indication of chronic kidney disease (CKD). Calculated eGFR is useful in patients with stable renal function. The eGFR calculation will not be reliable in acutely ill patients when serum creatinine is changing rapidly. It is not useful in patients on dialysis. The eGFR calculation may not be applicable to patients at the low and high extremes of body sizes, pregnant women, and vegetarians.    GFR, Estimated  Date Value Ref Range Status  01/18/2022 38 (L) >60 mL/min Final    Comment:    (NOTE) Calculated using the CKD-EPI Creatinine Equation (2021)    eGFR  Date Value Ref Range Status  03/25/2021 61 >59 mL/min/1.73 Final         Passed - Valid encounter within last 12 months    Recent Outpatient Visits  2 months ago Cutaneous abscess of neck   [facility] Mecum, Dani [place], PA-C   10 months ago LUQ pain   [place], [place], DO   1 year ago Essential hypertension   [place], [place], DO   1 year ago Essential hypertension   [place], [place], DO   1 year ago Grief   [facility] [place], [place], DO              Passed - Bone Mineral Density or Dexa Scan completed in the last 2 years

## 2022-02-03 ENCOUNTER — Other Ambulatory Visit: Payer: Self-pay | Admitting: Family Medicine

## 2022-02-03 ENCOUNTER — Other Ambulatory Visit: Payer: Self-pay

## 2022-02-03 DIAGNOSIS — C3491 Malignant neoplasm of unspecified part of right bronchus or lung: Secondary | ICD-10-CM

## 2022-02-03 MED ORDER — ONDANSETRON HCL 8 MG PO TABS: 8.0000 mg | ORAL_TABLET | Freq: Three times a day (TID) | ORAL | 1 refills | Status: DC | PRN

## 2022-02-03 NOTE — Telephone Encounter (Signed)
Please find out if she has enough to get her to her next appointment?

## 2022-02-03 NOTE — Telephone Encounter (Signed)
Requested medication (s) are due for refill today: routing for review  Requested medication (s) are on the active medication list: yes  Last refill:    Future visit scheduled: yes  Notes to clinic:  Unable to refill per protocol, courtesy refill already given, routing for provider approval.      Requested Prescriptions  Pending Prescriptions Disp Refills   alendronate (FOSAMAX) 70 MG tablet 12 tablet 3    Sig: Take 1 tablet (70 mg total) by mouth every 7 (seven) days. Take with a full glass of water on an empty stomach.     Endocrinology:  Bisphosphonates Failed - 02/03/2022  3:50 PM      Failed - Ca in normal range and within 360 days    Calcium  Date Value Ref Range Status  02/01/2022 8.7 (L) 8.9 - 10.3 mg/dL Final   Calcium, Total  Date Value Ref Range Status  05/14/2014 8.7 (L) mg/dL Final    Comment:    8.9-10.3 NOTE: New Reference Range  04/07/14          Failed - Vitamin D in normal range and within 360 days    No results found for: "KW4097DZ3", "GD9242AS3", "MH962IW9NLG", "25OHVITD3", "25OHVITD2", "25OHVITD1", "VD25OH"       Failed - Cr in normal range and within 360 days    Creatinine  Date Value Ref Range Status  05/14/2014 0.78 mg/dL Final    Comment:    0.44-1.00 NOTE: New Reference Range  04/07/14    Creatinine, Ser  Date Value Ref Range Status  02/01/2022 1.46 (H) 0.44 - 1.00 mg/dL Final         Failed - Mg Level in normal range and within 360 days    No results found for: "MG"       Failed - Phosphate in normal range and within 360 days    No results found for: "PHOS"       Passed - eGFR is 30 or above and within 360 days    EGFR (African American)  Date Value Ref Range Status  05/14/2014 >60  Final   GFR calc Af Amer  Date Value Ref Range Status  11/14/2019 64 >59 mL/min/1.73 Final    Comment:    **In accordance with recommendations from the NKF-ASN Task force,**   [facility] is in the process of updating its eGFR calculation to the    2021 CKD-EPI creatinine equation that estimates kidney function   without a race variable.    EGFR (Non-African Amer.)  Date Value Ref Range Status  05/14/2014 >60  Final    Comment:    eGFR values <54mL/min/1.73 m2 may be an indication of chronic kidney disease (CKD). Calculated eGFR is useful in patients with stable renal function. The eGFR calculation will not be reliable in acutely ill patients when serum creatinine is changing rapidly. It is not useful in patients on dialysis. The eGFR calculation may not be applicable to patients at the low and high extremes of body sizes, pregnant women, and vegetarians.    GFR, Estimated  Date Value Ref Range Status  02/01/2022 38 (L) >60 mL/min Final    Comment:    (NOTE) Calculated using the CKD-EPI Creatinine Equation (2021)    eGFR  Date Value Ref Range Status  03/25/2021 61 >59 mL/min/1.73 Final         Passed - Valid encounter within last 12 months    Recent Outpatient Visits  3 months ago Cutaneous abscess of neck   [facility] Mecum, Dani Gobble, PA-C   10 months ago LUQ pain   [place], [place], DO   1 year ago Essential hypertension   [place], [place], DO   1 year ago Essential hypertension   [place], [place], DO   1 year ago [place], [place], DO       Future Appointments             In 2 weeks Johnson, Megan P, DO [place], [facility]            Passed - Bone Mineral Density or Dexa Scan completed in the last 2 years       lisinopril (ZESTRIL) 20 MG tablet 90 tablet 0    Sig: Take 1 tablet (20 mg total) by mouth daily.     Cardiovascular:  ACE Inhibitors Failed - 02/03/2022  3:50 PM      Failed - Cr in normal range and within 180 days    Creatinine  Date Value Ref Range Status  05/14/2014 0.78 mg/dL Final    Comment:    0.44-1.00 NOTE: New Reference Range   04/07/14    Creatinine, Ser  Date Value Ref Range Status  02/01/2022 1.46 (H) 0.44 - 1.00 mg/dL Final         Passed - K in normal range and within 180 days    Potassium  Date Value Ref Range Status  02/01/2022 4.6 3.5 - 5.1 mmol/L Final  05/14/2014 4.3 mmol/L Final    Comment:    3.5-5.1 NOTE: New Reference Range  04/07/14          Passed - Patient is not pregnant      Passed - Last BP in normal range    BP Readings from Last 1 Encounters:  02/01/22 (!) 98/55         Passed - Valid encounter within last 6 months    Recent Outpatient Visits           3 months ago Cutaneous abscess of neck   [facility] Mecum, Erin E, PA-C   10 months ago LUQ pain   [place], [place], DO   1 year ago Essential hypertension   [place], [place], DO   1 year ago Essential hypertension   [place], [place], DO   1 year ago Grief   [place], [place], DO       Future Appointments             In 2 weeks Wynetta Emery, Barb Merino, DO [employer], [facility]

## 2022-02-03 NOTE — Telephone Encounter (Signed)
Medication Refill - Medication: alendronate (FOSAMAX) 70 MG tablet and lisinopril (ZESTRIL) 20 MG tablet  Has the patient contacted their pharmacy? Yes.   Pt told to contact provider  Preferred Pharmacy (with phone number or street name):  [facility], [place] Phone: [phone]  Fax: [phone]     Has the patient been seen for an appointment in the last year OR does the patient have an upcoming appointment? Yes.    Agent: Please be advised that RX refills may take up to 3 business days. We ask that you follow-up with your pharmacy.

## 2022-02-06 ENCOUNTER — Other Ambulatory Visit: Payer: Self-pay | Admitting: *Deleted

## 2022-02-06 NOTE — Telephone Encounter (Signed)
Called patient and husband says she was asleep. Advised patient to give our office a call back when she wakes up. Dr.Johnson is asking patient has enough medication to last until her scheduled appointment 02/20/22.   OK for [facility]/Nurse Triage to gather information if patient calls back.

## 2022-02-06 NOTE — Telephone Encounter (Signed)
Patient returning call and reports she is completely out of fosamax 70 mg and will need refill prior to 02/20/22 next schedule appt. Please send refill to [facility]. Pain level same and not sure if related to back pain or cancer pain. Patient will address with PCP at next OV.

## 2022-02-06 NOTE — Telephone Encounter (Signed)
Requested medication (s) are due for refill today: yes  Requested medication (s) are on the active medication list: yes  Last refill:  02/11/21 #12 3 refills  Future visit scheduled: yes in 2 weeks  Notes to clinic:  do you want to refill for #12 no refills or #3  no refills until next appt.? Patient out of medication.      Requested Prescriptions  Pending Prescriptions Disp Refills   alendronate (FOSAMAX) 70 MG tablet 12 tablet 3    Sig: Take 1 tablet (70 mg total) by mouth every 7 (seven) days. Take with a full glass of water on an empty stomach.     Endocrinology:  Bisphosphonates Failed - 02/06/2022  1:41 PM      Failed - Ca in normal range and within 360 days    Calcium  Date Value Ref Range Status  02/01/2022 8.7 (L) 8.9 - 10.3 mg/dL Final   Calcium, Total  Date Value Ref Range Status  05/14/2014 8.7 (L) mg/dL Final    Comment:    8.9-10.3 NOTE: New Reference Range  04/07/14          Failed - Vitamin D in normal range and within 360 days    No results found for: "UD1497WY6", "VZ8588FO2", "DX412IN8MVE", "25OHVITD3", "25OHVITD2", "25OHVITD1", "VD25OH"       Failed - Cr in normal range and within 360 days    Creatinine  Date Value Ref Range Status  05/14/2014 0.78 mg/dL Final    Comment:    0.44-1.00 NOTE: New Reference Range  04/07/14    Creatinine, Ser  Date Value Ref Range Status  02/01/2022 1.46 (H) 0.44 - 1.00 mg/dL Final         Failed - Mg Level in normal range and within 360 days    No results found for: "MG"       Failed - Phosphate in normal range and within 360 days    No results found for: "PHOS"       Passed - eGFR is 30 or above and within 360 days    EGFR (African American)  Date Value Ref Range Status  05/14/2014 >60  Final   GFR calc Af Amer  Date Value Ref Range Status  11/14/2019 64 >59 mL/min/1.73 Final    Comment:    **In accordance with recommendations from the NKF-ASN Task force,**   [facility] is in the process of updating its  eGFR calculation to the   2021 CKD-EPI creatinine equation that estimates kidney function   without a race variable.    EGFR (Non-African Amer.)  Date Value Ref Range Status  05/14/2014 >60  Final    Comment:    eGFR values <84mL/min/1.73 m2 may be an indication of chronic kidney disease (CKD). Calculated eGFR is useful in patients with stable renal function. The eGFR calculation will not be reliable in acutely ill patients when serum creatinine is changing rapidly. It is not useful in patients on dialysis. The eGFR calculation may not be applicable to patients at the low and high extremes of body sizes, pregnant women, and vegetarians.    GFR, Estimated  Date Value Ref Range Status  02/01/2022 38 (L) >60 mL/min Final    Comment:    (NOTE) Calculated using the CKD-EPI Creatinine Equation (2021)    eGFR  Date Value Ref Range Status  03/25/2021 61 >59 mL/min/1.73 Final         Passed - Valid encounter within last 12 months    Recent  Outpatient Visits           3 months ago Cutaneous abscess of neck   [facility] Mecum, Dani Gobble, PA-C   10 months ago LUQ pain   [place], [place], DO   1 year ago Essential hypertension   [place], [place], DO   1 year ago Essential hypertension   [place], [place], DO   1 year ago Grief   [place], DO       Future Appointments             In 2 weeks Wynetta Emery, Megan P, DO [place], [facility]            Passed - Bone Mineral Density or Dexa Scan completed in the last 2 years

## 2022-02-08 ENCOUNTER — Other Ambulatory Visit: Payer: Medicare PPO

## 2022-02-08 ENCOUNTER — Other Ambulatory Visit: Payer: Self-pay | Admitting: *Deleted

## 2022-02-08 ENCOUNTER — Ambulatory Visit: Payer: Medicare PPO

## 2022-02-08 ENCOUNTER — Other Ambulatory Visit: Payer: Self-pay | Admitting: Family Medicine

## 2022-02-08 ENCOUNTER — Ambulatory Visit: Payer: Medicare PPO | Admitting: Oncology

## 2022-02-08 MED ORDER — TRAMADOL HCL 50 MG PO TABS: 50.0000 mg | ORAL_TABLET | Freq: Two times a day (BID) | ORAL | 0 refills | Status: DC | PRN

## 2022-02-09 NOTE — Telephone Encounter (Signed)
Requested medication (s) are due for refill today:   Yes  Requested medication (s) are on the active medication list:   Yes  Future visit scheduled:   Yes 02/20/2022   Last ordered: 02/11/2021 #12, 3 refills  Returned because labs due per protocol.     Requested Prescriptions  Pending Prescriptions Disp Refills   alendronate (FOSAMAX) 70 MG tablet [Pharmacy Med Name: ALENDRONATE SODIUM 70 MG Tablet] 12 tablet 3    Sig: TAKE 1 TABLET EVERY 7 DAYS . TAKE WITH A FULL GLASS OF WATER ON AN EMPTY STOMACH.     Endocrinology:  Bisphosphonates Failed - 02/08/2022  6:50 PM      Failed - Ca in normal range and within 360 days    Calcium  Date Value Ref Range Status  02/01/2022 8.7 (L) 8.9 - 10.3 mg/dL Final   Calcium, Total  Date Value Ref Range Status  05/14/2014 8.7 (L) mg/dL Final    Comment:    8.9-10.3 NOTE: New Reference Range  04/07/14          Failed - Vitamin D in normal range and within 360 days    No results found for: "PJ8250NL9", "JQ7341PF7", "TK240XB3ZHG", "25OHVITD3", "25OHVITD2", "25OHVITD1", "VD25OH"       Failed - Cr in normal range and within 360 days    Creatinine  Date Value Ref Range Status  05/14/2014 0.78 mg/dL Final    Comment:    0.44-1.00 NOTE: New Reference Range  04/07/14    Creatinine, Ser  Date Value Ref Range Status  02/01/2022 1.46 (H) 0.44 - 1.00 mg/dL Final         Failed - Mg Level in normal range and within 360 days    No results found for: "MG"       Failed - Phosphate in normal range and within 360 days    No results found for: "PHOS"       Passed - eGFR is 30 or above and within 360 days    EGFR (African American)  Date Value Ref Range Status  05/14/2014 >60  Final   GFR calc Af Amer  Date Value Ref Range Status  11/14/2019 64 >59 mL/min/1.73 Final    Comment:    **In accordance with recommendations from the NKF-ASN Task force,**   [facility] is in the process of updating its eGFR calculation to the   2021 CKD-EPI creatinine  equation that estimates kidney function   without a race variable.    EGFR (Non-African Amer.)  Date Value Ref Range Status  05/14/2014 >60  Final    Comment:    eGFR values <15mL/min/1.73 m2 may be an indication of chronic kidney disease (CKD). Calculated eGFR is useful in patients with stable renal function. The eGFR calculation will not be reliable in acutely ill patients when serum creatinine is changing rapidly. It is not useful in patients on dialysis. The eGFR calculation may not be applicable to patients at the low and high extremes of body sizes, pregnant women, and vegetarians.    GFR, Estimated  Date Value Ref Range Status  02/01/2022 38 (L) >60 mL/min Final    Comment:    (NOTE) Calculated using the CKD-EPI Creatinine Equation (2021)    eGFR  Date Value Ref Range Status  03/25/2021 61 >59 mL/min/1.73 Final         Passed - Valid encounter within last 12 months    Recent Outpatient Visits           3  months ago Cutaneous abscess of neck   [facility] Mecum, Dani Gobble, PA-C   10 months ago LUQ pain   [place], [place], DO   1 year ago Essential hypertension   [place], [place], DO   1 year ago Essential hypertension   [place], [place], DO   1 year ago Grief   [place], [place], DO       Future Appointments             In 1 week Wynetta Emery, Barb Merino, DO [place], [facility]            Passed - Bone Mineral Density or Dexa Scan completed in the last 2 years

## 2022-02-10 MED ORDER — LISINOPRIL 20 MG PO TABS: 20.0000 mg | ORAL_TABLET | Freq: Every day | ORAL | 0 refills | Status: DC

## 2022-02-12 ENCOUNTER — Emergency Department: Payer: Medicare PPO

## 2022-02-12 ENCOUNTER — Emergency Department
Admission: EM | Admit: 2022-02-12 | Discharge: 2022-02-12 | Disposition: A | Discharge status: Home / Self Care | Payer: Medicare PPO | Attending: Emergency Medicine | Admitting: Emergency Medicine

## 2022-02-12 ENCOUNTER — Other Ambulatory Visit: Payer: Self-pay

## 2022-02-12 DIAGNOSIS — R1084 Generalized abdominal pain: Secondary | ICD-10-CM | POA: Diagnosis not present

## 2022-02-12 DIAGNOSIS — N189 Chronic kidney disease, unspecified: Secondary | ICD-10-CM | POA: Insufficient documentation

## 2022-02-12 DIAGNOSIS — R109 Unspecified abdominal pain: Secondary | ICD-10-CM | POA: Diagnosis not present

## 2022-02-12 DIAGNOSIS — R1030 Lower abdominal pain, unspecified: Secondary | ICD-10-CM | POA: Diagnosis not present

## 2022-02-12 DIAGNOSIS — R0902 Hypoxemia: Secondary | ICD-10-CM | POA: Diagnosis not present

## 2022-02-12 DIAGNOSIS — N3090 Cystitis, unspecified without hematuria: Secondary | ICD-10-CM | POA: Diagnosis not present

## 2022-02-12 DIAGNOSIS — C3490 Malignant neoplasm of unspecified part of unspecified bronchus or lung: Secondary | ICD-10-CM | POA: Diagnosis not present

## 2022-02-12 DIAGNOSIS — R1031 Right lower quadrant pain: Secondary | ICD-10-CM | POA: Diagnosis present

## 2022-02-12 DIAGNOSIS — R0689 Other abnormalities of breathing: Secondary | ICD-10-CM | POA: Diagnosis not present

## 2022-02-12 DIAGNOSIS — K5900 Constipation, unspecified: Secondary | ICD-10-CM | POA: Insufficient documentation

## 2022-02-12 DIAGNOSIS — R000 Tachycardia, unspecified: Secondary | ICD-10-CM | POA: Diagnosis not present

## 2022-02-12 LAB — URINALYSIS, ROUTINE W REFLEX MICROSCOPIC
Bilirubin Urine: NEGATIVE
Glucose, UA: NEGATIVE mg/dL
Ketones, ur: NEGATIVE mg/dL
Nitrite: NEGATIVE
Protein, ur: NEGATIVE mg/dL
Specific Gravity, Urine: >1.046 — ABNORMAL HIGH (ref 1.005–1.030)
pH: 5 (ref 5.0–8.0)

## 2022-02-12 LAB — LIPASE, BLOOD: Lipase: 33 U/L (ref 11–51)

## 2022-02-12 LAB — COMPREHENSIVE METABOLIC PANEL
ALT: 12 U/L (ref 0–44)
AST: 21 U/L (ref 15–41)
Albumin: 3.4 g/dL — ABNORMAL LOW (ref 3.5–5.0)
Alkaline Phosphatase: 90 U/L (ref 38–126)
Anion gap: 10 (ref 5–15)
BUN: 19 mg/dL (ref 8–23)
CO2: 25 mmol/L (ref 22–32)
Calcium: 9.7 mg/dL (ref 8.9–10.3)
Chloride: 98 mmol/L (ref 98–111)
Creatinine, Ser: 1.39 mg/dL — ABNORMAL HIGH (ref 0.44–1.00)
GFR, Estimated: 40 mL/min — ABNORMAL LOW (ref >60)
Glucose, Bld: 112 mg/dL — ABNORMAL HIGH (ref 70–99)
Potassium: 4.5 mmol/L (ref 3.5–5.1)
Sodium: 133 mmol/L — ABNORMAL LOW (ref 135–145)
Total Bilirubin: 0.5 mg/dL (ref 0.3–1.2)
Total Protein: 7.6 g/dL (ref 6.5–8.1)

## 2022-02-12 LAB — CBC
HCT: 31.6 % — ABNORMAL LOW (ref 36.0–46.0)
Hemoglobin: 10.5 g/dL — ABNORMAL LOW (ref 12.0–15.0)
MCH: 30.9 pg (ref 26.0–34.0)
MCHC: 33.2 g/dL (ref 30.0–36.0)
MCV: 92.9 fL (ref 80.0–100.0)
Platelets: 401 10*3/uL — ABNORMAL HIGH (ref 150–400)
RBC: 3.4 MIL/uL — ABNORMAL LOW (ref 3.87–5.11)
RDW: 13.3 % (ref 11.5–15.5)
WBC: 10.3 10*3/uL (ref 4.0–10.5)
nRBC: 0 % (ref 0.0–0.2)

## 2022-02-12 MED ORDER — CEPHALEXIN 500 MG PO CAPS: 500.0000 mg | ORAL_CAPSULE | Freq: Two times a day (BID) | ORAL | 0 refills | Status: AC

## 2022-02-12 MED ORDER — ONDANSETRON HCL 4 MG/2ML IJ SOLN
4.0000 mg | Freq: Once | INTRAMUSCULAR | Status: AC
  Administered 2022-02-12: 4 mg via INTRAVENOUS
  Filled 2022-02-12: qty 2

## 2022-02-12 MED ORDER — IOHEXOL 300 MG/ML  SOLN
80.0000 mL | Freq: Once | INTRAMUSCULAR | Status: AC | PRN
  Administered 2022-02-12: 80 mL via INTRAVENOUS

## 2022-02-12 MED ORDER — FENTANYL CITRATE PF 50 MCG/ML IJ SOSY
50.0000 ug | PREFILLED_SYRINGE | Freq: Once | INTRAMUSCULAR | Status: AC
  Administered 2022-02-12: 50 ug via INTRAVENOUS
  Filled 2022-02-12: qty 1

## 2022-02-12 MED ORDER — LACTULOSE 10 GM/15ML PO SOLN
10.0000 g | Freq: Once | ORAL | Status: AC
  Administered 2022-02-12: 10 g via ORAL
  Filled 2022-02-12: qty 30

## 2022-02-12 NOTE — ED Triage Notes (Signed)
Pt comes via EMS from home with c/o left belly pain for 2 days. Pt states constipation for almost 2 weeks. VSS

## 2022-02-12 NOTE — ED Provider Notes (Addendum)
[facility] Provider Note    Event Date/Time   First MD Initiated Contact with Patient 02/12/22 1649     (approximate)   History   Abdominal Pain   HPI  Mary Hoover is a 77 y.o. female with primary adenocarcinoma of the lung on chemotherapy every 2 weeks, CKD, anemia, back pain who comes in with abdominal pain.  Patient reports that the pain starts in her right lower abdomen that goes across the lower abdomen.  She does report having some issues with constipation and has been taking MiraLAX and was able to have some bowel movements yesterday and today but now she is having severe lower abdominal pain associated with it.  Denies any fevers.  She denies any new chest pain, shortness of breath, cough.  She denies any falls or hitting her head.  She does report taking tramadol for her pain.   Physical Exam   Triage Vital Signs: ED Triage Vitals  Enc Vitals Group     BP 02/12/22 1452 113/65     Pulse Rate 02/12/22 1452 84     Resp 02/12/22 1452 18     Temp 02/12/22 1452 98 F (36.7 C)     Temp src --      SpO2 02/12/22 1452 100 %     Weight --      Height --      Head Circumference --      Peak Flow --      Pain Score 02/12/22 1451 0     Pain Loc --      Pain Edu? --      Excl. in GC? --     Most recent vital signs: Vitals:   02/12/22 1452  BP: 113/65  Pulse: 84  Resp: 18  Temp: 98 F (36.7 C)  SpO2: 100%     General: Awake, no distress.  CV:  Good peripheral perfusion.  Resp:  Normal effort.  Abd:  No distention.  Tender in the lower abdomen. Other:     ED Results / Procedures / Treatments   Labs (all labs ordered are listed, but only abnormal results are displayed) Labs Reviewed  COMPREHENSIVE METABOLIC PANEL - Abnormal; Notable for the following components:      Result Value   Sodium 133 (*)    Glucose, Bld 112 (*)    Creatinine, Ser 1.39 (*)    Albumin 3.4 (*)    GFR, Estimated 40 (*)    All other components within  normal limits  CBC - Abnormal; Notable for the following components:   RBC 3.40 (*)    Hemoglobin 10.5 (*)    HCT 31.6 (*)    Platelets 401 (*)    All other components within normal limits  LIPASE, BLOOD  URINALYSIS, ROUTINE W REFLEX MICROSCOPIC      RADIOLOGY I have reviewed the CT personally and interpreted and constipation noted.    PROCEDURES:  Critical Care performed: No  Procedures   MEDICATIONS ORDERED IN ED: Medications  fentaNYL (SUBLIMAZE) injection 50 mcg (has no administration in time range)  ondansetron (ZOFRAN) injection 4 mg (has no administration in time range)     IMPRESSION / MDM / ASSESSMENT AND PLAN / ED COURSE  I reviewed the triage vital signs and the nursing notes.   Patient's presentation is most consistent with acute presentation with potential threat to life or bodily function.   Differential includes constipation, perforation, obstruction, urinary retention, metastatic cancer.  Patient  given some IV fentanyl to help with pain while awaiting CT imaging to further evaluate.  CBC shows stable white count and hemoglobin at baseline.  Lipase normal.  CMP shows creatinine around baseline.  Slightly low sodium but similar to 11 days ago.  CT scan only shows constipation  IMPRESSION: Large stool burden throughout the colon suggesting constipation.   Aortoiliac atherosclerosis.   No acute findings.    Offered patient enema here and treatment here but patient would prefer to do it in the comfort of her home.  She reports that they have a family member who has an enema at home.  Will give a dose of lactulose here to try to help facilitate bowel movements at home.  She is also going to take MiraLAX at home more frequently to try to help with bowel movements.  She does have a follow-up with a doctor tomorrow that she can discuss how this is going with.  We discussed that her tramadol is most likely the cause of the recurrent constipation and we  discussed trying to use MiraLAX daily or every other day to prevent this from happening again she expressed understanding at this time would like to be discharged home.  UA possible UTI does report some difficulty getting urine out will rx and send for culture.  FINAL CLINICAL IMPRESSION(S) / ED DIAGNOSES   Final diagnoses:  Constipation, unspecified constipation type  Malignant neoplasm of lung, unspecified laterality, unspecified part of lung (HCC)  Cystitis     Rx / DC Orders   ED Discharge Orders     None        Note:  This document was prepared using Dragon voice recognition software and may include unintentional dictation errors.   Concha Se, MD 02/12/22 1901    Concha Se, MD 02/12/22 [phone]

## 2022-02-12 NOTE — Discharge Instructions (Signed)
You can try an over-the-counter enema at home as well as use MiraLAX to try to help facilitate bowel movements.  You can take MiraLAX wait 2 hours and take another capful of MiraLAX to try to help facilitate bowel movements.  You should follow-up with your doctors tomorrow and discuss with them if you are not able to have a bowel movement tonight.  Return to the ER if develop worsening symptoms or any other concerns

## 2022-02-13 ENCOUNTER — Telehealth: Payer: Self-pay | Admitting: Oncology

## 2022-02-13 LAB — URINE CULTURE: Culture: <10000 — AB

## 2022-02-13 NOTE — Telephone Encounter (Signed)
Pt left a VM and stated she wanted to reschedule her appt. Please advise

## 2022-02-14 ENCOUNTER — Other Ambulatory Visit: Payer: Self-pay | Admitting: Oncology

## 2022-02-14 DIAGNOSIS — C3491 Malignant neoplasm of unspecified part of right bronchus or lung: Secondary | ICD-10-CM

## 2022-02-15 ENCOUNTER — Encounter: Payer: Self-pay | Admitting: Oncology

## 2022-02-15 ENCOUNTER — Inpatient Hospital Stay: Payer: Medicare PPO | Admitting: Oncology

## 2022-02-15 ENCOUNTER — Inpatient Hospital Stay: Payer: Medicare PPO

## 2022-02-20 ENCOUNTER — Encounter: Payer: Self-pay | Admitting: Family Medicine

## 2022-02-20 ENCOUNTER — Ambulatory Visit (INDEPENDENT_AMBULATORY_CARE_PROVIDER_SITE_OTHER): Payer: Medicare PPO | Admitting: Family Medicine

## 2022-02-20 VITALS — BP 93/59 | HR 105 | Temp 98.4°F | Ht 68.0 in | Wt 127.8 lb

## 2022-02-20 DIAGNOSIS — I700 Atherosclerosis of aorta: Secondary | ICD-10-CM | POA: Diagnosis not present

## 2022-02-20 DIAGNOSIS — E034 Atrophy of thyroid (acquired): Secondary | ICD-10-CM

## 2022-02-20 DIAGNOSIS — Z23 Encounter for immunization: Secondary | ICD-10-CM | POA: Diagnosis not present

## 2022-02-20 DIAGNOSIS — D487 Neoplasm of uncertain behavior of other specified sites: Secondary | ICD-10-CM | POA: Diagnosis not present

## 2022-02-20 DIAGNOSIS — I10 Essential (primary) hypertension: Secondary | ICD-10-CM

## 2022-02-20 DIAGNOSIS — E782 Mixed hyperlipidemia: Secondary | ICD-10-CM

## 2022-02-20 MED ORDER — LISINOPRIL 10 MG PO TABS: 10.0000 mg | ORAL_TABLET | Freq: Every day | ORAL | 1 refills | Status: DC

## 2022-02-20 MED ORDER — CLOPIDOGREL BISULFATE 75 MG PO TABS: 75.0000 mg | ORAL_TABLET | Freq: Every day | ORAL | 1 refills | Status: DC

## 2022-02-20 MED ORDER — TRELEGY ELLIPTA 100-62.5-25 MCG/ACT IN AEPB: 1.0000 | INHALATION_SPRAY | Freq: Every day | RESPIRATORY_TRACT | 11 refills | Status: DC

## 2022-02-20 MED ORDER — GABAPENTIN 100 MG PO CAPS: 100.0000 mg | ORAL_CAPSULE | Freq: Three times a day (TID) | ORAL | 1 refills | Status: DC

## 2022-02-20 MED ORDER — ALBUTEROL SULFATE HFA 108 (90 BASE) MCG/ACT IN AERS: 2.0000 | INHALATION_SPRAY | Freq: Four times a day (QID) | RESPIRATORY_TRACT | 6 refills | Status: DC | PRN

## 2022-02-20 NOTE — Progress Notes (Signed)
BP (!) 93/59 (BP Location: Left Arm, Cuff Size: Normal)   Pulse (!) 105   Temp 98.4 F (36.9 C) (Oral)   Ht 5\' 8"  (1.727 m)   Wt 127 lb 12.8 oz (58 kg)   SpO2 92%   BMI 19.43 kg/m    Subjective:    Patient ID: Mary Hoover, female    DOB: [birth year], 77 y.o.   MRN: [id number]  HPI: Mary Hoover is a 76 y.o. female  Chief Complaint  Patient presents with   Skin Problem    Patient says she noticed a place on her face and she picked at it until it scabbed over, but she has now noticed it back and it has come back and increased in sizes.   Hypertension   Hyperlipidemia   HYPERTENSION / HYPERLIPIDEMIA Satisfied with current treatment? yes Duration of hypertension: chronic BP monitoring frequency: rarely BP medication side effects: no Duration of hyperlipidemia: chronic Cholesterol medication side effects: no Cholesterol supplements: none Past cholesterol medications: none Medication compliance: good compliance Aspirin: no Recent stressors: no Recurrent headaches: no Visual changes: no Palpitations: no Dyspnea: yes Chest pain: no Lower extremity edema: no Dizzy/lightheaded: yes  HYPOTHYROIDISM Thyroid control status:controlled Satisfied with current treatment? yes Medication side effects: no Medication compliance: excellent compliance Recent dose adjustment:no Fatigue: yes Cold intolerance: yes Heat intolerance: no Weight gain: no Weight loss: no Constipation: yes Diarrhea/loose stools: no Palpitations: no Lower extremity edema: no Anxiety/depressed mood: no  Relevant past medical, surgical, family and social history reviewed and updated as indicated. Interim medical history since our last visit reviewed. Allergies and medications reviewed and updated.  Review of Systems  Constitutional: Negative.   HENT: Negative.    Respiratory: Negative.    Cardiovascular: Negative.   Gastrointestinal:  Positive for constipation. Negative for abdominal  distention, abdominal pain, anal bleeding, blood in stool, diarrhea, nausea, rectal pain and vomiting.  Musculoskeletal:  Positive for back pain. Negative for arthralgias, gait problem, joint swelling, myalgias, neck pain and neck stiffness.  Neurological:  Positive for dizziness and tremors. Negative for seizures, syncope, facial asymmetry, speech difficulty, weakness, light-headedness, numbness and headaches.  Hematological: Negative.   Psychiatric/Behavioral: Negative.      Per HPI unless specifically indicated above     Objective:    BP (!) 93/59 (BP Location: Left Arm, Cuff Size: Normal)   Pulse (!) 105   Temp 98.4 F (36.9 C) (Oral)   Ht 5\' 8"  (1.727 m)   Wt 127 lb 12.8 oz (58 kg)   SpO2 92%   BMI 19.43 kg/m   Wt Readings from Last 3 Encounters:  02/20/22 127 lb 12.8 oz (58 kg)  02/01/22 129 lb (58.5 kg)  01/18/22 129 lb 8 oz (58.7 kg)    Physical Exam Vitals and nursing note reviewed.  Constitutional:      General: She is not in acute distress.    Appearance: Normal appearance. She is normal weight. She is not ill-appearing, toxic-appearing or diaphoretic.  HENT:     Head: Normocephalic and atraumatic.     Right Ear: External ear normal.     Left Ear: External ear normal.     Nose: Nose normal.     Mouth/Throat:     Mouth: Mucous membranes are moist.     Pharynx: Oropharynx is clear.  Eyes:     General: No scleral icterus.       Right eye: No discharge.        Left eye: No  discharge.     Extraocular Movements: Extraocular movements intact.     Conjunctiva/sclera: Conjunctivae normal.     Pupils: Pupils are equal, round, and reactive to light.  Cardiovascular:     Rate and Rhythm: Normal rate and regular rhythm.     Pulses: Normal pulses.     Heart sounds: Normal heart sounds. No murmur heard.    No friction rub. No gallop.  Pulmonary:     Effort: Pulmonary effort is normal. No respiratory distress.     Breath sounds: Normal breath sounds. No stridor. No  wheezing, rhonchi or rales.  Chest:     Chest wall: No tenderness.  Musculoskeletal:        General: Normal range of motion.     Cervical back: Normal range of motion and neck supple.  Skin:    General: Skin is warm and dry.     Capillary Refill: Capillary refill takes less than 2 seconds.     Coloration: Skin is not jaundiced or pale.     Findings: No bruising, erythema, lesion or rash.     Comments: Stuck on crusty lesion on L cheek  Neurological:     General: No focal deficit present.     Mental Status: She is alert and oriented to person, place, and time. Mental status is at baseline.  Psychiatric:        Mood and Affect: Mood normal.        Behavior: Behavior normal.        Thought Content: Thought content normal.        Judgment: Judgment normal.     Results for orders placed or performed in visit on 02/20/22  CBC with Differential/Platelet  Result Value Ref Range   WBC 7.3 3.4 - 10.8 x10E3/uL   RBC 3.14 (L) 3.77 - 5.28 x10E6/uL   Hemoglobin 9.8 (L) 11.1 - 15.9 g/dL   Hematocrit 14.9 (L) 70.2 - 46.6 %   MCV 93 79 - 97 fL   MCH 31.2 26.6 - 33.0 pg   MCHC 33.7 31.5 - 35.7 g/dL   RDW 63.7 85.8 - 85.0 %   Platelets 480 (H) 150 - 450 x10E3/uL   Neutrophils 86 Not Estab. %   Lymphs 7 Not Estab. %   Monocytes 5 Not Estab. %   Eos 1 Not Estab. %   Basos 0 Not Estab. %   Neutrophils Absolute 6.3 1.4 - 7.0 x10E3/uL   Lymphocytes Absolute 0.5 (L) 0.7 - 3.1 x10E3/uL   Monocytes Absolute 0.4 0.1 - 0.9 x10E3/uL   EOS (ABSOLUTE) 0.1 0.0 - 0.4 x10E3/uL   Basophils Absolute 0.0 0.0 - 0.2 x10E3/uL   Immature Granulocytes 1 Not Estab. %   Immature Grans (Abs) 0.1 0.0 - 0.1 x10E3/uL  Comprehensive metabolic panel  Result Value Ref Range   Glucose 93 70 - 99 mg/dL   BUN 9 8 - 27 mg/dL   Creatinine, Ser 2.77 (H) 0.57 - 1.00 mg/dL   eGFR 42 (L) >41 OI/NOM/7.67   BUN/Creatinine Ratio 7 (L) 12 - 28   Sodium 135 134 - 144 mmol/L   Potassium 4.7 3.5 - 5.2 mmol/L   Chloride 97 96 -  106 mmol/L   CO2 20 20 - 29 mmol/L   Calcium 10.0 8.7 - 10.3 mg/dL   Total Protein 6.5 6.0 - 8.5 g/dL   Albumin 4.0 3.8 - 4.8 g/dL   Globulin, Total 2.5 1.5 - 4.5 g/dL   Albumin/Globulin Ratio 1.6 1.2 - 2.2  Bilirubin Total <0.2 0.0 - 1.2 mg/dL   Alkaline Phosphatase 100 44 - 121 IU/L   AST 13 0 - 40 IU/L   ALT 11 0 - 32 IU/L  Lipid Panel w/o Chol/HDL Ratio  Result Value Ref Range   Cholesterol, Total 175 100 - 199 mg/dL   Triglycerides 255 (H) 0 - 149 mg/dL   HDL 45 >25 mg/dL   VLDL Cholesterol Cal 29 5 - 40 mg/dL   LDL Chol Calc (NIH) 894 (H) 0 - 99 mg/dL  Urinalysis, Routine w reflex microscopic  Result Value Ref Range   Specific Gravity, UA 1.010 1.005 - 1.030   pH, UA 7.0 5.0 - 7.5   Color, UA Yellow Yellow   Appearance Ur Clear Clear   Leukocytes,UA Negative Negative   Protein,UA Negative Negative/Trace   Glucose, UA Negative Negative   Ketones, UA Negative Negative   RBC, UA Negative Negative   Bilirubin, UA Negative Negative   Urobilinogen, Ur 0.2 0.2 - 1.0 mg/dL   Nitrite, UA Negative Negative   Microscopic Examination Comment   TSH  Result Value Ref Range   TSH 3.360 0.450 - 4.500 uIU/mL  Microalbumin, Urine Waived  Result Value Ref Range   Microalb, Ur Waived 10 0 - 19 mg/L   Creatinine, Urine Waived 10 10 - 300 mg/dL   Microalb/Creat Ratio 30-300 (H) <30 mg/g      Assessment & Plan:   Problem List Items Addressed This Visit       Cardiovascular and Mediastinum   Essential hypertension - Primary    BP running low. Will cut lisinopril to 10mg  and recheck 1 month. Call with any concerns.       Relevant Medications   lisinopril (ZESTRIL) 10 MG tablet   Other Relevant Orders   CBC with Differential/Platelet (Completed)   Comprehensive metabolic panel (Completed)   Urinalysis, Routine w reflex microscopic (Completed)   Microalbumin, Urine Waived (Completed)   Aortic atherosclerosis (HCC)    Will keep BP and cholesterol under good control. Continue  to monitor. Call with any concerns.      Relevant Medications   lisinopril (ZESTRIL) 10 MG tablet   Other Relevant Orders   CBC with Differential/Platelet (Completed)   Comprehensive metabolic panel (Completed)     Endocrine   Hypothyroidism   Relevant Orders   CBC with Differential/Platelet (Completed)   Comprehensive metabolic panel (Completed)   TSH (Completed)     Other   Hyperlipidemia    Rechecking labs today. Await results. Treat as needed.        Relevant Medications   lisinopril (ZESTRIL) 10 MG tablet   Other Relevant Orders   CBC with Differential/Platelet (Completed)   Comprehensive metabolic panel (Completed)   Lipid Panel w/o Chol/HDL Ratio (Completed)   Other Visit Diagnoses     Neoplasm of uncertain behavior of face       Referral to dermatology placed today.   Relevant Orders   Ambulatory referral to Dermatology   Needs flu shot       Flu shot given today.   Relevant Orders   Flu Vaccine QUAD High Dose(Fluad) (Completed)        Follow up plan: Return in about 6 weeks (around 04/03/2022) for Physical/wellness.

## 2022-02-21 ENCOUNTER — Encounter: Payer: Self-pay | Admitting: Oncology

## 2022-02-21 ENCOUNTER — Telehealth: Payer: Self-pay | Admitting: Family Medicine

## 2022-02-21 LAB — LIPID PANEL W/O CHOL/HDL RATIO
Cholesterol, Total: 175 mg/dL (ref 100–199)
HDL: 45 mg/dL (ref >39)
LDL Chol Calc (NIH): 101 mg/dL — ABNORMAL HIGH (ref 0–99)
Triglycerides: 168 mg/dL — ABNORMAL HIGH (ref 0–149)
VLDL Cholesterol Cal: 29 mg/dL (ref 5–40)

## 2022-02-21 LAB — CBC WITH DIFFERENTIAL/PLATELET
Basophils Absolute: 0 10*3/uL (ref 0.0–0.2)
Basos: 0 %
EOS (ABSOLUTE): 0.1 10*3/uL (ref 0.0–0.4)
Eos: 1 %
Hematocrit: 29.1 % — ABNORMAL LOW (ref 34.0–46.6)
Hemoglobin: 9.8 g/dL — ABNORMAL LOW (ref 11.1–15.9)
Immature Grans (Abs): 0.1 10*3/uL (ref 0.0–0.1)
Immature Granulocytes: 1 %
Lymphocytes Absolute: 0.5 10*3/uL — ABNORMAL LOW (ref 0.7–3.1)
Lymphs: 7 %
MCH: 31.2 pg (ref 26.6–33.0)
MCHC: 33.7 g/dL (ref 31.5–35.7)
MCV: 93 fL (ref 79–97)
Monocytes Absolute: 0.4 10*3/uL (ref 0.1–0.9)
Monocytes: 5 %
Neutrophils Absolute: 6.3 10*3/uL (ref 1.4–7.0)
Neutrophils: 86 %
Platelets: 480 10*3/uL — ABNORMAL HIGH (ref 150–450)
RBC: 3.14 x10E6/uL — ABNORMAL LOW (ref 3.77–5.28)
RDW: 13.3 % (ref 11.7–15.4)
WBC: 7.3 10*3/uL (ref 3.4–10.8)

## 2022-02-21 LAB — URINALYSIS, ROUTINE W REFLEX MICROSCOPIC
Bilirubin, UA: NEGATIVE
Glucose, UA: NEGATIVE
Ketones, UA: NEGATIVE
Leukocytes,UA: NEGATIVE
Nitrite, UA: NEGATIVE
Protein,UA: NEGATIVE
RBC, UA: NEGATIVE
Specific Gravity, UA: 1.01 (ref 1.005–1.030)
Urobilinogen, Ur: 0.2 mg/dL (ref 0.2–1.0)
pH, UA: 7 (ref 5.0–7.5)

## 2022-02-21 LAB — COMPREHENSIVE METABOLIC PANEL
ALT: 11 IU/L (ref 0–32)
AST: 13 IU/L (ref 0–40)
Albumin/Globulin Ratio: 1.6 (ref 1.2–2.2)
Albumin: 4 g/dL (ref 3.8–4.8)
Alkaline Phosphatase: 100 IU/L (ref 44–121)
BUN/Creatinine Ratio: 7 — ABNORMAL LOW (ref 12–28)
BUN: 9 mg/dL (ref 8–27)
Bilirubin Total: <0.2 mg/dL (ref 0.0–1.2)
CO2: 20 mmol/L (ref 20–29)
Calcium: 10 mg/dL (ref 8.7–10.3)
Chloride: 97 mmol/L (ref 96–106)
Creatinine, Ser: 1.34 mg/dL — ABNORMAL HIGH (ref 0.57–1.00)
Globulin, Total: 2.5 g/dL (ref 1.5–4.5)
Glucose: 93 mg/dL (ref 70–99)
Potassium: 4.7 mmol/L (ref 3.5–5.2)
Sodium: 135 mmol/L (ref 134–144)
Total Protein: 6.5 g/dL (ref 6.0–8.5)
eGFR: 42 mL/min/{1.73_m2} — ABNORMAL LOW (ref >59)

## 2022-02-21 LAB — TSH: TSH: 3.36 u[IU]/mL (ref 0.450–4.500)

## 2022-02-21 LAB — MICROALBUMIN, URINE WAIVED
Creatinine, Urine Waived: 10 mg/dL (ref 10–300)
Microalb, Ur Waived: 10 mg/L (ref 0–19)

## 2022-02-21 NOTE — Telephone Encounter (Signed)
Copied from CRM [id number]. Topic: Medicare AWV >> Feb 21, 2022 11:50 AM Payton Doughty wrote: Reason for CRM: Called patient to schedule Annual Wellness Visit.  Please schedule with Health Nurse Advisor Kennedy Bucker at [facility].Call [facility] at [phone]

## 2022-02-24 ENCOUNTER — Encounter: Payer: Self-pay | Admitting: Family Medicine

## 2022-02-24 NOTE — Assessment & Plan Note (Signed)
BP running low. Will cut lisinopril to 10mg  and recheck 1 month. Call with any concerns.

## 2022-02-24 NOTE — Assessment & Plan Note (Signed)
Will keep BP and cholesterol under good control. Continue to monitor. Call with any concerns.

## 2022-02-24 NOTE — Assessment & Plan Note (Signed)
Rechecking labs today. Await results. Treat as needed.  °

## 2022-03-01 ENCOUNTER — Other Ambulatory Visit: Payer: Self-pay

## 2022-03-01 ENCOUNTER — Inpatient Hospital Stay: Payer: Medicare PPO

## 2022-03-01 ENCOUNTER — Ambulatory Visit: Payer: Medicare PPO

## 2022-03-01 ENCOUNTER — Other Ambulatory Visit: Payer: Medicare PPO

## 2022-03-01 ENCOUNTER — Ambulatory Visit: Payer: Medicare PPO | Admitting: Oncology

## 2022-03-01 ENCOUNTER — Inpatient Hospital Stay (HOSPITAL_BASED_OUTPATIENT_CLINIC_OR_DEPARTMENT_OTHER): Payer: Medicare PPO | Admitting: Oncology

## 2022-03-01 ENCOUNTER — Encounter: Payer: Self-pay | Admitting: Oncology

## 2022-03-01 VITALS — BP 109/59 | HR 101 | Temp 97.2°F | Wt 126.8 lb

## 2022-03-01 VITALS — HR 100

## 2022-03-01 DIAGNOSIS — R1310 Dysphagia, unspecified: Secondary | ICD-10-CM | POA: Diagnosis not present

## 2022-03-01 DIAGNOSIS — C3491 Malignant neoplasm of unspecified part of right bronchus or lung: Secondary | ICD-10-CM | POA: Diagnosis not present

## 2022-03-01 DIAGNOSIS — Z5112 Encounter for antineoplastic immunotherapy: Secondary | ICD-10-CM

## 2022-03-01 DIAGNOSIS — R634 Abnormal weight loss: Secondary | ICD-10-CM

## 2022-03-01 DIAGNOSIS — N1831 Chronic kidney disease, stage 3a: Secondary | ICD-10-CM

## 2022-03-01 DIAGNOSIS — M545 Low back pain, unspecified: Secondary | ICD-10-CM

## 2022-03-01 DIAGNOSIS — Z79899 Other long term (current) drug therapy: Secondary | ICD-10-CM | POA: Diagnosis not present

## 2022-03-01 DIAGNOSIS — R110 Nausea: Secondary | ICD-10-CM

## 2022-03-01 DIAGNOSIS — Z720 Tobacco use: Secondary | ICD-10-CM

## 2022-03-01 DIAGNOSIS — G8929 Other chronic pain: Secondary | ICD-10-CM

## 2022-03-01 DIAGNOSIS — D631 Anemia in chronic kidney disease: Secondary | ICD-10-CM | POA: Diagnosis not present

## 2022-03-01 DIAGNOSIS — I129 Hypertensive chronic kidney disease with stage 1 through stage 4 chronic kidney disease, or unspecified chronic kidney disease: Secondary | ICD-10-CM | POA: Diagnosis not present

## 2022-03-01 LAB — CBC WITH DIFFERENTIAL/PLATELET
Abs Immature Granulocytes: 0.03 10*3/uL (ref 0.00–0.07)
Basophils Absolute: 0 10*3/uL (ref 0.0–0.1)
Basophils Relative: 0 %
Eosinophils Absolute: 0.2 10*3/uL (ref 0.0–0.5)
Eosinophils Relative: 3 %
HCT: 26.7 % — ABNORMAL LOW (ref 36.0–46.0)
Hemoglobin: 8.8 g/dL — ABNORMAL LOW (ref 12.0–15.0)
Immature Granulocytes: 1 %
Lymphocytes Relative: 10 %
Lymphs Abs: 0.6 10*3/uL — ABNORMAL LOW (ref 0.7–4.0)
MCH: 31.2 pg (ref 26.0–34.0)
MCHC: 33 g/dL (ref 30.0–36.0)
MCV: 94.7 fL (ref 80.0–100.0)
Monocytes Absolute: 0.5 10*3/uL (ref 0.1–1.0)
Monocytes Relative: 8 %
Neutro Abs: 4.6 10*3/uL (ref 1.7–7.7)
Neutrophils Relative %: 78 %
Platelets: 296 10*3/uL (ref 150–400)
RBC: 2.82 MIL/uL — ABNORMAL LOW (ref 3.87–5.11)
RDW: 14.3 % (ref 11.5–15.5)
WBC: 5.9 10*3/uL (ref 4.0–10.5)
nRBC: 0 % (ref 0.0–0.2)

## 2022-03-01 LAB — COMPREHENSIVE METABOLIC PANEL WITH GFR
ALT: 8 U/L (ref 0–44)
AST: 18 U/L (ref 15–41)
Albumin: 3.2 g/dL — ABNORMAL LOW (ref 3.5–5.0)
Alkaline Phosphatase: 70 U/L (ref 38–126)
Anion gap: 10 (ref 5–15)
BUN: 13 mg/dL (ref 8–23)
CO2: 22 mmol/L (ref 22–32)
Calcium: 8.8 mg/dL — ABNORMAL LOW (ref 8.9–10.3)
Chloride: 99 mmol/L (ref 98–111)
Creatinine, Ser: 1.3 mg/dL — ABNORMAL HIGH (ref 0.44–1.00)
GFR, Estimated: 43 mL/min — ABNORMAL LOW
Glucose, Bld: 103 mg/dL — ABNORMAL HIGH (ref 70–99)
Potassium: 4.1 mmol/L (ref 3.5–5.1)
Sodium: 131 mmol/L — ABNORMAL LOW (ref 135–145)
Total Bilirubin: 0.4 mg/dL (ref 0.3–1.2)
Total Protein: 6.6 g/dL (ref 6.5–8.1)

## 2022-03-01 LAB — FERRITIN: Ferritin: 90 ng/mL (ref 11–307)

## 2022-03-01 LAB — IRON AND TIBC
Iron: 64 ug/dL (ref 28–170)
Saturation Ratios: 27 % (ref 10.4–31.8)
TIBC: 237 ug/dL — ABNORMAL LOW (ref 250–450)
UIBC: 173 ug/dL

## 2022-03-01 LAB — RETIC PANEL
Immature Retic Fract: 9.4 % (ref 2.3–15.9)
RBC.: 2.78 MIL/uL — ABNORMAL LOW (ref 3.87–5.11)
Retic Count, Absolute: 49.8 10*3/uL (ref 19.0–186.0)
Retic Ct Pct: 1.8 % (ref 0.4–3.1)
Reticulocyte Hemoglobin: 34.7 pg (ref >27.9)

## 2022-03-01 MED ORDER — SODIUM CHLORIDE 0.9 % IV SOLN
Freq: Once | INTRAVENOUS | Status: AC
  Filled 2022-03-01: qty 250

## 2022-03-01 MED ORDER — HEPARIN SOD (PORK) LOCK FLUSH 100 UNIT/ML IV SOLN
500.0000 [IU] | Freq: Once | INTRAVENOUS | Status: AC | PRN
  Administered 2022-03-01: 500 [IU]
  Filled 2022-03-01: qty 5

## 2022-03-01 MED ORDER — DOCUSATE SODIUM 100 MG PO CAPS: 100.0000 mg | ORAL_CAPSULE | Freq: Every day | ORAL | 4 refills | Status: DC

## 2022-03-01 MED ORDER — SODIUM CHLORIDE 0.9 % IV SOLN
10.0000 mg/kg | Freq: Once | INTRAVENOUS | Status: AC
  Administered 2022-03-01: 620 mg via INTRAVENOUS
  Filled 2022-03-01: qty 10

## 2022-03-01 NOTE — Assessment & Plan Note (Signed)
Continue tramadol every 6 hours as needed.

## 2022-03-01 NOTE — Assessment & Plan Note (Signed)
Intermittent chronic problem for patient, improved.  Continue Zofran and Compazine as needed.

## 2022-03-01 NOTE — Assessment & Plan Note (Addendum)
Stage III right lung adenocarcinoma,  Recurrent S/p concurrent chemotherapy and radiation- On immunotherapy durvalumab maintenance Labs reviewed and discussed with patient.  Proceed with Durvalumab treatments Surveillance CT showed post RT changes. New small right LLL 3-62mm nodule- attention on follow up ?aspiration, refer to speech.swallow evaluation.

## 2022-03-01 NOTE — Assessment & Plan Note (Signed)
She agrees with nutritionist evaluation.

## 2022-03-01 NOTE — Patient Instructions (Signed)
[facility]  Discharge Instructions: Thank you for choosing [facility] to provide your oncology and hematology care.  If you have a lab appointment with the [facility], please go directly to the [facility] and check in at the registration area.  Wear comfortable clothing and clothing appropriate for easy access to any Portacath or PICC line.   We strive to give you quality time with your provider. You may need to reschedule your appointment if you arrive late (15 or more minutes).  Arriving late affects you and other patients whose appointments are after yours.  Also, if you miss three or more appointments without notifying the office, you may be dismissed from the clinic at the provider's discretion.      For prescription refill requests, have your pharmacy contact our office and allow 72 hours for refills to be completed.    Today you received the following chemotherapy and/or immunotherapy agents- Imfinzi      To help prevent nausea and vomiting after your treatment, we encourage you to take your nausea medication as directed.  BELOW ARE SYMPTOMS THAT SHOULD BE REPORTED IMMEDIATELY: *FEVER GREATER THAN 100.4 F (38 C) OR HIGHER *CHILLS OR SWEATING *NAUSEA AND VOMITING THAT IS NOT CONTROLLED WITH YOUR NAUSEA MEDICATION *UNUSUAL SHORTNESS OF BREATH *UNUSUAL BRUISING OR BLEEDING *URINARY PROBLEMS (pain or burning when urinating, or frequent urination) *BOWEL PROBLEMS (unusual diarrhea, constipation, pain near the anus) TENDERNESS IN MOUTH AND THROAT WITH OR WITHOUT PRESENCE OF ULCERS (sore throat, sores in mouth, or a toothache) UNUSUAL RASH, SWELLING OR PAIN  UNUSUAL VAGINAL DISCHARGE OR ITCHING   Items with * indicate a potential emergency and should be followed up as soon as possible or go to the Emergency Department if any problems should occur.  Please show the CHEMOTHERAPY ALERT CARD or IMMUNOTHERAPY ALERT CARD at check-in to  the Emergency Department and triage nurse.  Should you have questions after your visit or need to cancel or reschedule your appointment, please contact [facility]  [phone] and follow the prompts.  Office hours are 8:00 a.m. to 4:30 p.m. Monday - Friday. Please note that voicemails left after 4:00 p.m. may not be returned until the following business day.  We are closed weekends and major holidays. You have access to a nurse at all times for urgent questions. Please call the main number to the clinic [phone] and follow the prompts.  For any non-urgent questions, you may also contact your provider using MyChart. We now offer e-Visits for anyone 91 and older to request care online for non-urgent symptoms. For details visit [email or web address].   Also download the MyChart app! Go to the app store, search "MyChart", open the app, select [facility], and log in with your MyChart username and password.

## 2022-03-01 NOTE — Assessment & Plan Note (Signed)
Increased creatinine, patient will receive 1 L of normal saline for IV hydration.   Contrast study recently Avoid nephrotoxins. Encourage oral hydration.

## 2022-03-01 NOTE — Progress Notes (Signed)
Hematology/Oncology Progress note [phone] [phone]     CHIEF COMPLAINTS/REASON FOR VISIT:  Follow up lung cancer treatments   ASSESSMENT & PLAN:   Cancer Staging  Cervical cancer ([place]) Staging form: Cervix Uteri, AJCC Version 9 - Clinical: FIGO Stage IIICr (cTX, cN1) - Unsigned  Primary lung adenocarcinoma ([place]) Staging form: Lung, AJCC 7th Edition - Clinical stage from 01/12/2021: T1, N1, M0 - Signed by [name], MD on 01/29/2021   Primary lung adenocarcinoma ([place]) Stage III right lung adenocarcinoma,  Recurrent S/p concurrent chemotherapy and radiation- On immunotherapy durvalumab maintenance Labs reviewed and discussed with patient.  Proceed with Durvalumab treatments Surveillance CT showed post RT changes. New small right LLL 3-50m nodule- attention on follow up ?aspiration, refer to speech.swallow evaluation.   Anemia in chronic kidney disease (CODE) Hemoglobin is worse despite being on oral iron supplementation.  Ferritin <200.  Recommend IV venofer.  I discussed about the potential risks including but not limited to allergic reactions/infusion reactions including anaphylactic reactions, phlebitis, high blood pressure, wheezing, SOB, skin rash, weight gain,dark urine, leg swelling, back pain, headache, nausea and fatigue, etc. Patient agrees with IV venofer treatments.  Plan IV venofer weekly x 3    Back pain Continue tramadol every 6 hours as needed.    CKD (chronic kidney disease) stage 3, GFR 30-59 ml/min (HCC) Increased creatinine, patient will receive 1 L of normal saline for IV hydration.   Contrast study recently Avoid nephrotoxins. Encourage oral hydration.    Encounter for antineoplastic immunotherapy Treatment plan was listed above  Nausea without vomiting Intermittent chronic problem for patient, improved.  Continue Zofran and Compazine as needed.  Tobacco use Recommend smoke cessation.  Weight loss She agrees with  nutritionist evaluation.    Orders Placed This Encounter  Procedures   Ferritin    Standing Status:   Future    Number of Occurrences:   1    Standing Expiration Date:   03/02/2023   Iron and TIBC    Standing Status:   Future    Number of Occurrences:   1    Standing Expiration Date:   03/02/2023   Retic Panel    Standing Status:   Future    Number of Occurrences:   1    Standing Expiration Date:   03/02/2023   Ambulatory referral to Speech Therapy    Referral Priority:   Routine    Referral Type:   Speech Therapy    Referral Reason:   Specialty Services Required    Requested Specialty:   Speech Pathology    Number of Visits Requested:   1   Follow up in 2 weeks.  Lab MD Durvalumab. IV Venofer weekly x 3.  All questions were answered. The patient knows to call the clinic with any problems, questions or concerns.  ZEarlie Server MD, PhD [facility] H[facility] Oncology 03/01/2022      HISTORY OF PRESENTING ILLNESS:  # Cervix cancer Patient has developed postmenopausal vaginal bleeding and discharge.  She was noted to have a friable cervix/2 cm mass of the cervix, concerning for malignancy 12/19/2018 endometrial biopsy showed scattered atypical squamous cells suspicious for malignancy.  Predominantly necrosis with associated inflammation. 01/01/2019 initial cervix biopsy showed at least high-grade squamous intraepithelial lesion.-HPV negative 01/22/2019, repeat vaginal wall biopsy and cervix 9:00 biopsy showed squamous cell carcinoma.    Staging images 12/31/2018 showed fluid distending the endometrial canal and or endometrial thickening to the level of cervix.  No evidence of pathological  lymphadenopathy.  Haziness about the lower cervix.  7 mm irregular pulmonary nodule within the right upper lobe, suspicious for possible primary or metastatic malignancy.  Chronic pleural-parenchymal scarring/fibrosis at the lung apices.  Large amount of stool.  No bowel obstruction. 01/08/2019 PET  scan showed marked hypermetabolism in the region of the cervix, compatible with the reported history of cervical cancer. Small bilateral pelvic sidewall lymph nodes discernible FDG accumulation, concerning for metastatic disease although neither lymph node is enlarged by CT size criteria. 7 mm nodule in the right upper lobe shows discernible FDG accumulation.  Questionable neoplasm, primary versus metastatic. Emphysema.   # 02/20/2020 - 03/19/2020 concurrent chemotherapy weekly carboplatin and taxol and radiation for treatment of this cervix cancer.   Oncology History  Primary lung adenocarcinoma ([place])  11/20/2020 Imaging   CT chest with contrast showed minimal residual of previously seen right upper lobe nodule. 0.6 x 0.4 cm. Internal development of mild bandlike radiation fibrosis. Newly enlarged pretracheal lymph node, measuring 2.5 x 1.7 cm. Highly concerning for metastatic disease. Unchanged prominent AP window and the right hilar lymph nodes. Emphysema and diffuse bilateral bronchial wall thickening. Coronary artery disease.    12/13/2020 Imaging   PET scan showed enlarging hypermetabolic right lower paratracheal lymph node, compatible with metastatic disease. SUV 10.3. Mild FDG uptake of the bilateral sacral alla with associated lucency. Concerning for sacral insufficiency fracture. Stable posttreatment changes of the right upper lobe nodule. Nonspecific small solid 3 mm pulmonary nodule of the left lower lobe. 2 small to be characterized. No evidence of FDG avid metastatic disease in the abdomen or pelvis. Aortic atherosclerosis and emphysema.    01/12/2021 Initial Diagnosis   12/2019 lung nodules FDG avid, questionable primary bronchogenic carcinoma versus metastatic cancer.Discussed with radiation oncology. Her case was also discussed on tumor board. Consensus reached on finishing concurrent chemoradiation treatments for cervix cancer first. Possible SBRT to lung nodule for presumed primary  lung cancer.  She was evaluated by Dr.Oaks. Options of biopsy via transthoracic or endobronchial approach vs surgical resection. Patient opted to empiric SBRT.  She lost follow up with me until Oct 2022   Primary lung adenocarcinoma [facility]) 11/28/22Patient re establish care with me  for lung cancer management.  01/12/2021 patient underwent bronchoscopy biopsy by Dr. Patsey Berthold Right paratracheal lymph node fine-needle aspiration is positive for metastatic non-small cell carcinoma, favor adenocarcinoma of lung origin.  Circulogen NGS negative PD-L1, ALK, ROS1, NTRK1/2/3,    01/12/2021 Cancer Staging   Staging form: Lung, AJCC 7th Edition - Clinical stage from 01/12/2021: T1, N1, M0 - Signed by [name], MD on 01/29/2021 Laterality: Right Stage used in treatment planning: Yes National guidelines used in treatment planning: Yes Type of national guideline used in treatment planning: NCCN   02/14/2021 - 03/28/2021 Chemotherapy   Concurrent chemotherapy and Radiation  LUNG Carboplatin / Paclitaxel + XRT q7d      04/26/2021 Imaging   CT chest with contrast showed evolving postradiation changes in the posterior perifissural upper right lung without discrete residual measurable nodule in this location. Right hilar/mediastinal lymphadenopathy stable to decreased. Tiny 0.2 cm left lower lobe pulmonary nodule slightly decreased. Positive response to treatment. No new or progressive disease. Three-vessel coronary arthrosclerosis. Aortic atherosclerosis/emphysema.    05/19/2021 -  Chemotherapy   Patient is on Treatment Plan : LUNG Durvalumab (10) q14d     07/25/2021 Imaging   CT showed 1. Questionable slight enlargement a small left lower lobe nodule measuring up to 5 mm of the sagittal  images compared with the most recent prior study. Attention on follow-up in approximately 6 months recommended.2. Otherwise stable chest CT with stable radiation changes superiorly in the right hemithorax and small residual  right hilar lymph node.3. Coronary and aortic atherosclerosis (ICD10-I70.0). Emphysema   10/26/2021 Imaging   CT chest abdomen pelvis w contrast 1. Bandlike consolidative changes in the posterior RIGHT upper lobe appear more organized at the periphery. No focal masslike characteristics. Findings are compatible with evolving post treatment changes. Findings are favored to represent evolving post treatment changes. 2. Nodule in the LEFT lower lobe a 4 mm may be very slowly increasing in size over time, certainly as compared to March of 2023. Continued attention on follow-up is suggested. 3. Stable 9 mm RIGHT hilar lymph node. 4. Evidence of sacral insufficiency fractures not substantially changed. 5. Pulmonary emphysema and aortic atherosclerosis.     01/31/2022 Imaging   CT chest w contrast 1. Nodular areas developing of bandlike post treatment changes are of uncertain significance. While potentially evolving post treatment changes increasingly nodular foci amidst expected consolidative changes a mildly suspicious. PET imaging or continued short interval follow-up could be considered. 2. New small irregular nodules in the RIGHT lower lobe measuring 3-4 mm with some indistinct adjacent septal thickening. These are nonspecific and could be infectious or inflammatory. Particularly given the presence of material in the RIGHT lower lobe bronchus. 3. Stable 4 mm LEFT lower lobe pulmonary nodule. 4. Three-vessel coronary artery calcification as on previous imaging quite extensive. 5. Emphysema and aortic atherosclerosis.  Aortic Atherosclerosis and Emphysema   02/12/2022 Imaging   CT abdomen pelvis wo contrast Large stool burden throughout the colon suggesting constipation. Aortoiliac atherosclerosis. No acute findings.   Cervical cancer ([place])  02/03/2019 Initial Diagnosis   Malignant neoplasm of cervix ([place])   02/20/2019 - 03/20/2019 Chemotherapy   Patient is on Treatment Plan : Carboplatin q7d w/  XRT     02/14/2021 - 03/28/2021 Chemotherapy   Concurrent chemotherapy and Radiation  LUNG Carboplatin / Paclitaxel + XRT q7d         INTERVAL HISTORY Mary Hoover is a 77 y.o. female who has above history reviewed by me today presents for follow up visit for management of possible recurrent lung cancer.  History of cervix cancer.   She tolerates immunotherapy well.  Back pain is controlled with pain medication. Nausea is better controlled with utilizing her home antiemetics. Appetite is not good she does not eat well. Denies any aspiration episodes after eating.  She does have chronic cough.   Review of Systems  Constitutional:  Positive for appetite change, fatigue and unexpected weight change. Negative for chills and fever.  HENT:   Positive for hearing loss. Negative for voice change.   Eyes:  Negative for eye problems.  Respiratory:  Positive for cough. Negative for chest tightness.   Cardiovascular:  Negative for chest pain.  Gastrointestinal:  Positive for nausea. Negative for abdominal distention, abdominal pain and blood in stool.  Endocrine: Negative for hot flashes.  Genitourinary:  Negative for difficulty urinating and frequency.   Musculoskeletal:  Negative for arthralgias.  Skin:  Negative for itching and rash.  Neurological:  Negative for extremity weakness.  Hematological:  Negative for adenopathy.  Psychiatric/Behavioral:  Negative for confusion.     MEDICAL HISTORY:  Past Medical History:  Diagnosis Date   Arthritis    Cancer [facility])    Carotid atherosclerosis    Carotid bruit    Cervical cancer ([place])  COPD (chronic obstructive pulmonary disease) (HCC)    emphysema   Coronary artery disease    Family history of adverse reaction to anesthesia    paternal grandfather died during surgery-pt unaware what happened   GERD (gastroesophageal reflux disease)    Hyperlipidemia    Hypertension    Hyponatremia 02/27/2019   Hypothyroidism    Infected cat  bite 1980s   was hospitalized   Irregular heartbeat    Medical history non-contributory    Peripheral vascular disease ([place])    Renal insufficiency    Tobacco use     SURGICAL HISTORY: Past Surgical History:  Procedure Laterality Date   CAROTID ENDARTERECTOMY Right Oct. 2015   Dr. Lucky Cowboy   CAROTID STENOSIS Left April 2016   carotid stenosis surgery   COLONOSCOPY WITH PROPOFOL N/A 11/08/2016   Procedure: COLONOSCOPY WITH PROPOFOL;  Surgeon: Jonathon Bellows, MD;  [facility] END[facility]COPY;  Service: Gastroenterology;  Laterality: N/A;   ESOPHAGOGASTRODUODENOSCOPY (EGD) WITH PROPOFOL N/A 11/08/2016   Procedure: ESOPHAGOGASTRODUODENOSCOPY (EGD) WITH PROPOFOL;  Surgeon: Jonathon Bellows, MD;  Location: [facility] ENDOSCOPY;  Service: Gastroenterology;  Laterality: N/A;   FLEXIBLE BRONCHOSCOPY N/A 12/17/2014   Procedure: FLEXIBLE BRONCHOSCOPY;  Surgeon: Wilhelmina Mcardle, MD;  Location: [facility] ORS;  Service: Pulmonary;  Laterality: N/A;   INCISION AND DRAINAGE / EXCISION THYROGLOSSAL CYST  April 2011   PORTA CATH INSERTION N/A 03/03/2021   Procedure: PORTA CATH INSERTION;  Surgeon: Algernon Huxley, MD;  Location: [place] CV LAB;  Service: Cardiovascular;  Laterality: N/A;   VIDEO BRONCHOSCOPY WITH ENDOBRONCHIAL ULTRASOUND N/A 01/12/2021   Procedure: VIDEO BRONCHOSCOPY WITH ENDOBRONCHIAL ULTRASOUND;  Surgeon: Tyler Pita, MD;  Location: [facility] ORS;  Service: Cardiopulmonary;  Laterality: N/A;    SOCIAL HISTORY: Social History   Socioeconomic History   Marital status: Married    Spouse name: Deidre Ala   Number of children: Not on file   Years of education: Not on file   Highest education level: 9th grade  Occupational History   Occupation: retired   Tobacco Use   Smoking status: Every Day    Packs/day: 0.50    Years: 50.00    Total pack years: 25.00    Types: Cigarettes   Smokeless tobacco: Never   Tobacco comments:    0.5 PPD 02/10/2021  Vaping Use   Vaping Use: Former  Substance and Sexual  Activity   Alcohol use: No    Alcohol/week: 0.0 standard drinks of alcohol   Drug use: No   Sexual activity: Yes  Other Topics Concern   Not on file  Social History Narrative   Lives at home with husband   Social Determinants of Health   Financial Resource Strain: Low Risk  (03/15/2021)   Overall Financial Resource Strain (CARDIA)    Difficulty of Paying Living Expenses: Not hard at all  Food Insecurity: No Food Insecurity (03/15/2021)   Hunger Vital Sign    Worried About Running Out of Food in the Last Year: Never true    [place] in the Last Year: Never true  Transportation Needs: No Transportation Needs (03/15/2021)   PRAPARE - Hydrologist (Medical): No    Lack of Transportation (Non-Medical): No  Physical Activity: Inactive (03/15/2021)   Exercise Vital Sign    Days of Exercise per Week: 0 days    Minutes of Exercise per Session: 0 min  Stress: No Stress Concern Present (03/15/2021)   [place]  Questionnaire    Feeling of Stress : Not at all  Social Connections: Moderately Isolated (03/15/2021)   Social Connection and Isolation Panel [NHANES]    Frequency of Communication with Friends and Family: More than three times a week    Frequency of Social Gatherings with Friends and Family: More than three times a week    Attends Religious Services: Never    Marine scientist or Organizations: No    Attends Archivist Meetings: Never    Marital Status: Married  Human resources officer Violence: Not At Risk (03/15/2021)   Humiliation, Afraid, Rape, and Kick questionnaire    Fear of Current or Ex-Partner: No    Emotionally Abused: No    Physically Abused: No    Sexually Abused: No    FAMILY HISTORY: Family History  Problem Relation Age of Onset   Congestive Heart Failure Mother    Heart disease Mother    Hypertension Mother    Hypertension Sister    Diabetes Sister    Heart  disease Sister    Heart disease Maternal Uncle    Heart disease Maternal Grandmother    Stroke Maternal Grandfather    Cancer Brother        liver, lung   Heart disease Brother    Hypertension Brother    Heart attack Brother    COPD Neg Hx    Breast cancer Neg Hx     ALLERGIES:  is allergic to atorvastatin.  MEDICATIONS:  Current Outpatient Medications  Medication Sig Dispense Refill   acetaminophen (TYLENOL) 500 MG tablet Take 500 mg by mouth every 6 (six) hours as needed for moderate pain.     albuterol (VENTOLIN HFA) 108 (90 Base) MCG/ACT inhaler Inhale 2 puffs into the lungs every 6 (six) hours as needed for wheezing or shortness of breath. 8 g 6   alendronate (FOSAMAX) 70 MG tablet TAKE 1 TABLET EVERY 7 DAYS . TAKE WITH A FULL GLASS OF WATER ON AN EMPTY STOMACH. 12 tablet 3   chlorhexidine (PERIDEX) 0.12 % solution Use as directed 5 mLs in the mouth or throat 2 (two) times daily. 120 mL 0   clopidogrel (PLAVIX) 75 MG tablet Take 1 tablet (75 mg total) by mouth daily. 90 tablet 1   cyclobenzaprine (FLEXERIL) 10 MG tablet TAKE 1 TABLET AT BEDTIME 90 tablet 1   docusate sodium (COLACE) 100 MG capsule Take 1 capsule (100 mg total) by mouth daily. 30 capsule 4   Fluticasone-Umeclidin-Vilant (TRELEGY ELLIPTA) 100-62.5-25 MCG/ACT AEPB Inhale 1 puff into the lungs daily. 1 each 11   gabapentin (NEURONTIN) 100 MG capsule Take 1 capsule (100 mg total) by mouth 3 (three) times daily. 270 capsule 1   Iron-Vitamin C 65-125 MG TABS Take 1 tablet by mouth daily. 30 tablet 3   levothyroxine (SYNTHROID) 88 MCG tablet Take 1 tablet (88 mcg total) by mouth daily. 90 tablet 3   lidocaine (LIDODERM) 5 % Place 1 patch onto the skin daily. Remove & Discard patch within 12 hours or as directed by MD 90 patch 1   lidocaine-prilocaine (EMLA) cream Apply 1 application topically as needed. 60 g 3   lisinopril (ZESTRIL) 10 MG tablet Take 1 tablet (10 mg total) by mouth daily. 30 tablet 1   Multiple Vitamin  (MULTIVITAMIN) tablet Take 1 tablet by mouth daily.     omeprazole (PRILOSEC) 20 MG capsule TAKE 1 CAPSULE EVERY DAY 90 capsule 3   ondansetron (ZOFRAN) 8 MG tablet Take 1 tablet (  8 mg total) by mouth every 8 (eight) hours as needed for refractory nausea / vomiting. Start on day 3 after chemo. 30 tablet 1   prochlorperazine (COMPAZINE) 10 MG tablet Take 1 tablet (10 mg total) by mouth every 6 (six) hours as needed for nausea or vomiting. 30 tablet 0   traMADol (ULTRAM) 50 MG tablet Take 1-2 tablets (50-100 mg total) by mouth every 12 (twelve) hours as needed. 180 tablet 0   vitamin B-12 (CYANOCOBALAMIN) 500 MCG tablet Take 500 mcg by mouth daily.     No current facility-administered medications for this visit.   Facility-Administered Medications Ordered in Other Visits  Medication Dose Route Frequency Provider Last Rate Last Admin   durvalumab (IMFINZI) 620 mg in sodium chloride 0.9 % 100 mL chemo infusion  10 mg/kg (Treatment Plan Recorded) Intravenous Once [name], MD 112 mL/hr at 03/01/22 1217 620 mg at 03/01/22 1217   heparin lock flush 100 unit/mL  500 Units Intracatheter Once PRN [name], MD         PHYSICAL EXAMINATION: ECOG PERFORMANCE STATUS: 1 - Symptomatic but completely ambulatory Vitals:   03/01/22 0909  BP: (!) 109/59  Pulse: (!) 101  Temp: (!) 97.2 F (36.2 C)  SpO2: 100%    Filed Weights   03/01/22 0909  Weight: 126 lb 12.8 oz (57.5 kg)     Physical Exam Constitutional:      General: She is not in acute distress. HENT:     Head: Normocephalic and atraumatic.  Eyes:     General: No scleral icterus.    Pupils: Pupils are equal, round, and reactive to light.  Cardiovascular:     Rate and Rhythm: Normal rate.  Pulmonary:     Effort: Pulmonary effort is normal. No respiratory distress.     Breath sounds: No wheezing.     Comments: Decreased breath sound bilaterally  Abdominal:     General: Bowel sounds are normal. There is no distension.     Palpations:  Abdomen is soft.  Musculoskeletal:        General: No deformity. Normal range of motion.     Cervical back: Normal range of motion and neck supple.     Comments: Trace edema bilaterally  Skin:    General: Skin is warm and dry.  Neurological:     Mental Status: She is alert and oriented to person, place, and time. Mental status is at baseline.     Cranial Nerves: No cranial nerve deficit.     Coordination: Coordination normal.  Psychiatric:        Mood and Affect: Mood normal.      LABORATORY DATA:  I have reviewed the data as listed    Latest Ref Rng & Units 03/01/2022    8:58 AM 02/20/2022    3:40 PM 02/12/2022    3:00 PM  CBC  WBC 4.0 - 10.5 K/uL 5.9  7.3  10.3   Hemoglobin 12.0 - 15.0 g/dL 8.8  9.8  10.5   Hematocrit 36.0 - 46.0 % 26.7  29.1  31.6   Platelets 150 - 400 K/uL 296  480  401       Latest Ref Rng & Units 03/01/2022    8:58 AM 02/20/2022    3:40 PM 02/12/2022    3:00 PM  CMP  Glucose 70 - 99 mg/dL 103  93  112   BUN 8 - 23 mg/dL 13  9  19    Creatinine 0.44 - 1.00 mg/dL  1.30  1.34  1.39   Sodium 135 - 145 mmol/L 131  135  133   Potassium 3.5 - 5.1 mmol/L 4.1  4.7  4.5   Chloride 98 - 111 mmol/L 99  97  98   CO2 22 - 32 mmol/L 22  20  25    Calcium 8.9 - 10.3 mg/dL 8.8  10.0  9.7   Total Protein 6.5 - 8.1 g/dL 6.6  6.5  7.6   Total Bilirubin 0.3 - 1.2 mg/dL 0.4  <0.2  0.5   Alkaline Phos 38 - 126 U/L 70  100  90   AST 15 - 41 U/L 18  13  21    ALT 0 - 44 U/L 8  11  12       Iron/TIBC/Ferritin/ %Sat    Component Value Date/Time   IRON 64 03/01/2022 1007   TIBC 237 (L) 03/01/2022 1007   FERRITIN 90 03/01/2022 1007   IRONPCTSAT 27 03/01/2022 1007      RADIOGRAPHIC STUDIES: I have personally reviewed the radiological images as listed and agreed with the findings in the report. CT ABDOMEN PELVIS W CONTRAST  Result Date: 02/12/2022 CLINICAL DATA:  Abdominal pain, acute, nonlocalized EXAM: CT ABDOMEN AND PELVIS WITH CONTRAST TECHNIQUE: Multidetector CT  imaging of the abdomen and pelvis was performed using the standard protocol following bolus administration of intravenous contrast. RADIATION DOSE REDUCTION: This exam was performed according to the departmental dose-optimization program which includes automated exposure control, adjustment of the mA and/or kV according to patient size and/or use of iterative reconstruction technique. CONTRAST:  52m OMNIPAQUE IOHEXOL 300 MG/ML  SOLN COMPARISON:  10/25/2021 FINDINGS: Lower chest: No acute abnormality Hepatobiliary: No focal hepatic abnormality. Gallbladder unremarkable. Pancreas: No focal abnormality or ductal dilatation. Spleen: No focal abnormality.  Normal size. Adrenals/Urinary Tract: No adrenal abnormality. No focal renal abnormality. No stones or hydronephrosis. Urinary bladder is unremarkable. Stomach/Bowel: Large stool burden throughout the colon suggesting constipation. Stomach and small bowel decompressed. Vascular/Lymphatic: Diffuse aortoiliac atherosclerosis. No evidence of aneurysm or adenopathy. Reproductive: Uterus and adnexa unremarkable.  No mass. Other: No free fluid or free air. Musculoskeletal: No acute bony abnormality. IMPRESSION: Large stool burden throughout the colon suggesting constipation. Aortoiliac atherosclerosis. No acute findings. Electronically Signed   By: KRolm BaptiseM.D.   On: 02/12/2022 18:06   CT Chest W Contrast  Result Date: 02/01/2022 CLINICAL DATA:  77year old female presents for follow-up of lung cancer. * Tracking Code: BO * EXAM: CT CHEST WITH CONTRAST TECHNIQUE: Multidetector CT imaging of the chest was performed during intravenous contrast administration. RADIATION DOSE REDUCTION: This exam was performed according to the departmental dose-optimization program which includes automated exposure control, adjustment of the mA and/or kV according to patient size and/or use of iterative reconstruction technique. CONTRAST:  638mOMNIPAQUE IOHEXOL 300 MG/ML  SOLN  COMPARISON:  October 25, 2021 FINDINGS: Cardiovascular: Calcified and noncalcified aortic atherosclerotic plaque. Normal heart size. Three-vessel coronary artery calcification as on previous imaging quite extensive. LEFT-sided IJ Port-A-Cath terminates in the mid RIGHT atrium. Central pulmonary vasculature is unremarkable on venous phase. Mediastinum/Nodes: Mild generalized thickening of the esophagus is stable and likely post treatment related. RIGHT hilar lymph node (image 65/2) 8 mm, unchanged. No new signs of adenopathy in the chest. Lungs/Pleura: Pulmonary emphysema as on previous imaging. Material in the dependent aspect of the bronchus intermedius in the RIGHT chest. Bandlike consolidative changes extend from the RIGHT hilum into the peripheral RIGHT chest. Associated septal thickening and bandlike changes are more  pronounced than on previous imaging. Nodular area amidst bandlike changes at the site of previous nodule measuring 7 mm greatest craniocaudal extent previously greatest area of more bandlike and non nodular change at 5 mm greatest thickness, another area on image 78/5 measuring 7 mm as well. More peripheral area of nodularity (image 67/5 appears bandlike with some thickening medially. LEFT lower lobe pulmonary nodule stable at 4 mm (image 101/3) New small irregular nodules in the RIGHT lower lobe (image 91/3 and image 88/3 measuring 3-4 mm with some indistinct adjacent septal thickening. Mild apical septal thickening generalized throughout the LEFT and RIGHT upper lobes greatest on the RIGHT similar to previous imaging. Upper Abdomen: Incidental imaging of upper abdominal contents without acute process. Musculoskeletal: No chest wall mass.  No acute bone finding. IMPRESSION: 1. Nodular areas developing of bandlike post treatment changes are of uncertain significance. While potentially evolving post treatment changes increasingly nodular foci amidst expected consolidative changes a mildly  suspicious. PET imaging or continued short interval follow-up could be considered. 2. New small irregular nodules in the RIGHT lower lobe measuring 3-4 mm with some indistinct adjacent septal thickening. These are nonspecific and could be infectious or inflammatory. Particularly given the presence of material in the RIGHT lower lobe bronchus. 3. Stable 4 mm LEFT lower lobe pulmonary nodule. 4. Three-vessel coronary artery calcification as on previous imaging quite extensive. 5. Emphysema and aortic atherosclerosis. Aortic Atherosclerosis (ICD10-I70.0) and Emphysema (ICD10-J43.9). Electronically Signed   By: Zetta Bills M.D.   On: 02/01/2022 14:57

## 2022-03-01 NOTE — Assessment & Plan Note (Signed)
Recommend smoke cessation.  

## 2022-03-01 NOTE — Assessment & Plan Note (Signed)
Hemoglobin is worse despite being on oral iron supplementation.  Ferritin <200.  Recommend IV venofer.  I discussed about the potential risks including but not limited to allergic reactions/infusion reactions including anaphylactic reactions, phlebitis, high blood pressure, wheezing, SOB, skin rash, weight gain,dark urine, leg swelling, back pain, headache, nausea and fatigue, etc. Patient agrees with IV venofer treatments.  Plan IV venofer weekly x 3

## 2022-03-01 NOTE — Assessment & Plan Note (Signed)
Treatment plan was listed above

## 2022-03-02 ENCOUNTER — Telehealth: Payer: Self-pay

## 2022-03-02 NOTE — Telephone Encounter (Signed)
-----  Message from [name], MD sent at 03/01/2022  4:32 PM EST ----- Please arrange her to get IV venofer weekly x 3.  We discussed about this possibility during today's encounter.

## 2022-03-03 ENCOUNTER — Encounter: Payer: Self-pay | Admitting: Oncology

## 2022-03-07 ENCOUNTER — Encounter: Payer: Medicare PPO | Admitting: Nurse Practitioner

## 2022-03-09 ENCOUNTER — Inpatient Hospital Stay: Payer: Medicare PPO

## 2022-03-09 MED FILL — Iron Sucrose Inj 20 MG/ML (Fe Equiv): INTRAVENOUS | Qty: 10 | Status: AC

## 2022-03-10 ENCOUNTER — Inpatient Hospital Stay: Payer: Medicare PPO

## 2022-03-10 ENCOUNTER — Inpatient Hospital Stay: Payer: Medicare PPO | Attending: Oncology

## 2022-03-10 VITALS — BP 127/56 | HR 83 | Temp 97.0°F | Resp 17

## 2022-03-10 DIAGNOSIS — Z5112 Encounter for antineoplastic immunotherapy: Secondary | ICD-10-CM | POA: Insufficient documentation

## 2022-03-10 DIAGNOSIS — I129 Hypertensive chronic kidney disease with stage 1 through stage 4 chronic kidney disease, or unspecified chronic kidney disease: Secondary | ICD-10-CM | POA: Insufficient documentation

## 2022-03-10 DIAGNOSIS — F17210 Nicotine dependence, cigarettes, uncomplicated: Secondary | ICD-10-CM | POA: Diagnosis not present

## 2022-03-10 DIAGNOSIS — D631 Anemia in chronic kidney disease: Secondary | ICD-10-CM | POA: Diagnosis not present

## 2022-03-10 DIAGNOSIS — C3491 Malignant neoplasm of unspecified part of right bronchus or lung: Secondary | ICD-10-CM | POA: Diagnosis not present

## 2022-03-10 DIAGNOSIS — Z79899 Other long term (current) drug therapy: Secondary | ICD-10-CM | POA: Diagnosis not present

## 2022-03-10 DIAGNOSIS — E871 Hypo-osmolality and hyponatremia: Secondary | ICD-10-CM

## 2022-03-10 DIAGNOSIS — M549 Dorsalgia, unspecified: Secondary | ICD-10-CM | POA: Insufficient documentation

## 2022-03-10 MED ORDER — SODIUM CHLORIDE 0.9 % IV SOLN
Freq: Once | INTRAVENOUS | Status: AC
  Filled 2022-03-10: qty 250

## 2022-03-10 MED ORDER — SODIUM CHLORIDE 0.9 % IV SOLN
200.0000 mg | Freq: Once | INTRAVENOUS | Status: AC
  Administered 2022-03-10: 200 mg via INTRAVENOUS
  Filled 2022-03-10: qty 10

## 2022-03-10 MED ORDER — HEPARIN SOD (PORK) LOCK FLUSH 100 UNIT/ML IV SOLN
INTRAVENOUS | Status: AC
  Filled 2022-03-10: qty 5

## 2022-03-10 NOTE — Progress Notes (Signed)
Nutrition Follow-up:  Re-referral for weight loss  77 year old female with lung cancer and history of cervical cancer.  Patient receiving immunotherapy for lung cancer.    Met with patient during infusion. Reports that she does not have much of an appetite.  Rarely eats breakfast during the week. Husband cooks breakfast on the weekend.  Grand-daughter does most of cooking at home.  Usually eats around 1-2 pm and then again for supper.  Says that she will eat when she has food that she likes.  Likes cottage cheese and fruit, meatloaf, green beans, corn, cheese and crackers, grilled cheese.  Has gotten tired of drinking shakes.  Reports that she had an issue with constipation and abdominal pain which effected her appetite.  Says that her bowels are moving more regularly now.  Husband primarily goes to the grocery store.  She goes sometimes with her grand-daughter.    Medications: reviewed  Labs: reviewed  Anthropometrics:   Weight 126 lb 12.8 1/31 134 lb 8 oz on 11/1 135 lb on 9/22  138 lb on 08/26/21 last seen by RD  8% weight loss in the last 6 months   NUTRITION DIAGNOSIS: Inadequate oral intake related to cancer related treatment side effects as evidenced by 8% weight loss in the last 6 months and decreased intake, constipation   INTERVENTION:  Discussed ways to add calories and protein in diet.  High Calorie, High Protein Diet handout provided Encouraged taking medication to promote bowel movement    MONITORING, EVALUATION, GOAL: weight trends, intake   NEXT VISIT: Wednesday, Feb 28 during infusion  Shadrach Bartunek B. Zenia Resides, [place], [place] Registered Dietitian [phone]

## 2022-03-15 ENCOUNTER — Inpatient Hospital Stay (HOSPITAL_BASED_OUTPATIENT_CLINIC_OR_DEPARTMENT_OTHER): Payer: Medicare PPO | Admitting: Oncology

## 2022-03-15 ENCOUNTER — Inpatient Hospital Stay: Payer: Medicare PPO

## 2022-03-15 ENCOUNTER — Encounter: Payer: Self-pay | Admitting: Oncology

## 2022-03-15 VITALS — BP 95/46 | HR 98 | Temp 96.7°F | Wt 127.9 lb

## 2022-03-15 DIAGNOSIS — D631 Anemia in chronic kidney disease: Secondary | ICD-10-CM | POA: Diagnosis not present

## 2022-03-15 DIAGNOSIS — M545 Low back pain, unspecified: Secondary | ICD-10-CM

## 2022-03-15 DIAGNOSIS — Z720 Tobacco use: Secondary | ICD-10-CM | POA: Diagnosis not present

## 2022-03-15 DIAGNOSIS — M549 Dorsalgia, unspecified: Secondary | ICD-10-CM | POA: Diagnosis not present

## 2022-03-15 DIAGNOSIS — G8929 Other chronic pain: Secondary | ICD-10-CM | POA: Diagnosis not present

## 2022-03-15 DIAGNOSIS — R110 Nausea: Secondary | ICD-10-CM | POA: Diagnosis not present

## 2022-03-15 DIAGNOSIS — Z5112 Encounter for antineoplastic immunotherapy: Secondary | ICD-10-CM | POA: Diagnosis not present

## 2022-03-15 DIAGNOSIS — F17210 Nicotine dependence, cigarettes, uncomplicated: Secondary | ICD-10-CM | POA: Diagnosis not present

## 2022-03-15 DIAGNOSIS — N1831 Chronic kidney disease, stage 3a: Secondary | ICD-10-CM

## 2022-03-15 DIAGNOSIS — I129 Hypertensive chronic kidney disease with stage 1 through stage 4 chronic kidney disease, or unspecified chronic kidney disease: Secondary | ICD-10-CM | POA: Diagnosis not present

## 2022-03-15 DIAGNOSIS — C3491 Malignant neoplasm of unspecified part of right bronchus or lung: Secondary | ICD-10-CM

## 2022-03-15 DIAGNOSIS — Z79899 Other long term (current) drug therapy: Secondary | ICD-10-CM | POA: Diagnosis not present

## 2022-03-15 LAB — CBC WITH DIFFERENTIAL/PLATELET
Abs Immature Granulocytes: 0.03 10*3/uL (ref 0.00–0.07)
Basophils Absolute: 0 10*3/uL (ref 0.0–0.1)
Basophils Relative: 1 %
Eosinophils Absolute: 0.2 10*3/uL (ref 0.0–0.5)
Eosinophils Relative: 3 %
HCT: 27.4 % — ABNORMAL LOW (ref 36.0–46.0)
Hemoglobin: 9.2 g/dL — ABNORMAL LOW (ref 12.0–15.0)
Immature Granulocytes: 1 %
Lymphocytes Relative: 12 %
Lymphs Abs: 0.7 10*3/uL (ref 0.7–4.0)
MCH: 31.6 pg (ref 26.0–34.0)
MCHC: 33.6 g/dL (ref 30.0–36.0)
MCV: 94.2 fL (ref 80.0–100.0)
Monocytes Absolute: 0.5 10*3/uL (ref 0.1–1.0)
Monocytes Relative: 8 %
Neutro Abs: 4.3 10*3/uL (ref 1.7–7.7)
Neutrophils Relative %: 75 %
Platelets: 280 10*3/uL (ref 150–400)
RBC: 2.91 MIL/uL — ABNORMAL LOW (ref 3.87–5.11)
RDW: 14.5 % (ref 11.5–15.5)
WBC: 5.7 10*3/uL (ref 4.0–10.5)
nRBC: 0 % (ref 0.0–0.2)

## 2022-03-15 LAB — COMPREHENSIVE METABOLIC PANEL
ALT: 9 U/L (ref 0–44)
AST: 17 U/L (ref 15–41)
Albumin: 3.3 g/dL — ABNORMAL LOW (ref 3.5–5.0)
Alkaline Phosphatase: 74 U/L (ref 38–126)
Anion gap: 9 (ref 5–15)
BUN: 15 mg/dL (ref 8–23)
CO2: 23 mmol/L (ref 22–32)
Calcium: 9.2 mg/dL (ref 8.9–10.3)
Chloride: 100 mmol/L (ref 98–111)
Creatinine, Ser: 1.14 mg/dL — ABNORMAL HIGH (ref 0.44–1.00)
GFR, Estimated: 51 mL/min — ABNORMAL LOW (ref >60)
Glucose, Bld: 99 mg/dL (ref 70–99)
Potassium: 4.2 mmol/L (ref 3.5–5.1)
Sodium: 132 mmol/L — ABNORMAL LOW (ref 135–145)
Total Bilirubin: <0.1 mg/dL — ABNORMAL LOW (ref 0.3–1.2)
Total Protein: 6.6 g/dL (ref 6.5–8.1)

## 2022-03-15 LAB — CORTISOL: Cortisol, Plasma: 12.2 ug/dL

## 2022-03-15 MED ORDER — SODIUM CHLORIDE 0.9% FLUSH
10.0000 mL | INTRAVENOUS | Status: DC | PRN
  Administered 2022-03-15: 10 mL
  Filled 2022-03-15: qty 10

## 2022-03-15 MED ORDER — SODIUM CHLORIDE 0.9 % IV SOLN
10.0000 mg/kg | Freq: Once | INTRAVENOUS | Status: AC
  Administered 2022-03-15: 620 mg via INTRAVENOUS
  Filled 2022-03-15: qty 10

## 2022-03-15 MED ORDER — SODIUM CHLORIDE 0.9% FLUSH
10.0000 mL | INTRAVENOUS | Status: DC | PRN
  Administered 2022-03-15: 10 mL via INTRAVENOUS
  Filled 2022-03-15: qty 10

## 2022-03-15 MED ORDER — SODIUM CHLORIDE 0.9 % IV SOLN
INTRAVENOUS | Status: DC
  Filled 2022-03-15 (×2): qty 250

## 2022-03-15 MED ORDER — HEPARIN SOD (PORK) LOCK FLUSH 100 UNIT/ML IV SOLN
500.0000 [IU] | Freq: Once | INTRAVENOUS | Status: AC | PRN
  Administered 2022-03-15: 500 [IU]
  Filled 2022-03-15: qty 5

## 2022-03-15 MED ORDER — SODIUM CHLORIDE 0.9 % IV SOLN
Freq: Once | INTRAVENOUS | Status: AC
  Filled 2022-03-15: qty 250

## 2022-03-15 NOTE — Assessment & Plan Note (Signed)
Continue tramadol every 6 hours as needed.

## 2022-03-15 NOTE — Assessment & Plan Note (Signed)
Hemoglobin is worse despite being on oral iron supplementation.  Ferritin <200.  Recommend IV venofer x total 3 doses.

## 2022-03-15 NOTE — Progress Notes (Signed)
Hematology/Oncology Progress note [phone] [phone]     CHIEF COMPLAINTS/REASON FOR VISIT:  Follow up lung cancer treatments   ASSESSMENT & PLAN:   Cancer Staging  Cervical cancer ([place]) Staging form: Cervix Uteri, AJCC Version 9 - Clinical: FIGO Stage IIICr (cTX, cN1) - Unsigned  Primary lung adenocarcinoma ([place]) Staging form: Lung, AJCC 7th Edition - Clinical stage from 01/12/2021: T1, N1, M0 - Signed by [name], MD on 01/29/2021   Primary lung adenocarcinoma ([place]) Stage III right lung adenocarcinoma,  Recurrent S/p concurrent chemotherapy and radiation- On immunotherapy durvalumab maintenance Labs reviewed and discussed with patient.  Proceed with Durvalumab treatments Surveillance CT showed post RT changes. New small right LLL 3-34mm nodule- attention on follow up ?aspiration, refer to speech.swallow evaluation.   Anemia in chronic kidney disease (CODE) Hemoglobin is worse despite being on oral iron supplementation.  Ferritin <200.  Recommend IV venofer x total 3 doses.   Back pain Continue tramadol every 6 hours as needed.    CKD (chronic kidney disease) stage 3, GFR 30-59 ml/min (HCC) Creatinine improved. BP is low, will give her 500cc IVF NS today Avoid nephrotoxins. Encourage oral hydration.    Encounter for antineoplastic immunotherapy Treatment plan was listed above  Nausea without vomiting Intermittent chronic problem for patient, improved.  Continue Zofran and Compazine as needed.  Tobacco use Recommend smoke cessation.   Orders Placed This Encounter  Procedures   ACTH    Standing Status:   Future    Number of Occurrences:   1    Standing Expiration Date:   03/16/2023   Cortisol    Standing Status:   Future    Number of Occurrences:   1    Standing Expiration Date:   03/16/2023   Follow up in 2 weeks.  Lab MD Durvalumab.  All questions were answered. The patient knows to call the clinic with any problems, questions  or concerns.  [name], MD, PhD [facility] Hematology Oncology 03/15/2022      HISTORY OF PRESENTING ILLNESS:  # Cervix cancer Patient has developed postmenopausal vaginal bleeding and discharge.  She was noted to have a friable cervix/2 cm mass of the cervix, concerning for malignancy 12/19/2018 endometrial biopsy showed scattered atypical squamous cells suspicious for malignancy.  Predominantly necrosis with associated inflammation. 01/01/2019 initial cervix biopsy showed at least high-grade squamous intraepithelial lesion.-HPV negative 01/22/2019, repeat vaginal wall biopsy and cervix 9:00 biopsy showed squamous cell carcinoma.    Staging images 12/31/2018 showed fluid distending the endometrial canal and or endometrial thickening to the level of cervix.  No evidence of pathological lymphadenopathy.  Haziness about the lower cervix.  7 mm irregular pulmonary nodule within the right upper lobe, suspicious for possible primary or metastatic malignancy.  Chronic pleural-parenchymal scarring/fibrosis at the lung apices.  Large amount of stool.  No bowel obstruction. 01/08/2019 PET scan showed marked hypermetabolism in the region of the cervix, compatible with the reported history of cervical cancer. Small bilateral pelvic sidewall lymph nodes discernible FDG accumulation, concerning for metastatic disease although neither lymph node is enlarged by CT size criteria. 7 mm nodule in the right upper lobe shows discernible FDG accumulation.  Questionable neoplasm, primary versus metastatic. Emphysema.   # 02/20/2020 - 03/19/2020 concurrent chemotherapy weekly carboplatin and taxol and radiation for treatment of this cervix cancer.   Oncology History  Primary lung adenocarcinoma ([place])  11/20/2020 Imaging   CT chest with contrast showed minimal residual of previously seen right upper lobe nodule. 0.6  x 0.4 cm. Internal development of mild bandlike radiation fibrosis. Newly enlarged pretracheal lymph  node, measuring 2.5 x 1.7 cm. Highly concerning for metastatic disease. Unchanged prominent AP window and the right hilar lymph nodes. Emphysema and diffuse bilateral bronchial wall thickening. Coronary artery disease.    12/13/2020 Imaging   PET scan showed enlarging hypermetabolic right lower paratracheal lymph node, compatible with metastatic disease. SUV 10.3. Mild FDG uptake of the bilateral sacral alla with associated lucency. Concerning for sacral insufficiency fracture. Stable posttreatment changes of the right upper lobe nodule. Nonspecific small solid 3 mm pulmonary nodule of the left lower lobe. 2 small to be characterized. No evidence of FDG avid metastatic disease in the abdomen or pelvis. Aortic atherosclerosis and emphysema.    01/12/2021 Initial Diagnosis   12/2019 lung nodules FDG avid, questionable primary bronchogenic carcinoma versus metastatic cancer.Discussed with radiation oncology. Her case was also discussed on tumor board. Consensus reached on finishing concurrent chemoradiation treatments for cervix cancer first. Possible SBRT to lung nodule for presumed primary lung cancer.  She was evaluated by Dr.Oaks. Options of biopsy via transthoracic or endobronchial approach vs surgical resection. Patient opted to empiric SBRT.  She lost follow up with me until Oct 2022   Primary lung adenocarcinoma [facility]) 11/28/22Patient re establish care with me  for lung cancer management.  01/12/2021 patient underwent bronchoscopy biopsy by Dr. Patsey Berthold Right paratracheal lymph node fine-needle aspiration is positive for metastatic non-small cell carcinoma, favor adenocarcinoma of lung origin.  Circulogen NGS negative PD-L1, ALK, ROS1, NTRK1/2/3,    01/12/2021 Cancer Staging   Staging form: Lung, AJCC 7th Edition - Clinical stage from 01/12/2021: T1, N1, M0 - Signed by [name], MD on 01/29/2021 Laterality: Right Stage used in treatment planning: Yes National guidelines used in treatment  planning: Yes Type of national guideline used in treatment planning: NCCN   02/14/2021 - 03/28/2021 Chemotherapy   Concurrent chemotherapy and Radiation  LUNG Carboplatin / Paclitaxel + XRT q7d      04/26/2021 Imaging   CT chest with contrast showed evolving postradiation changes in the posterior perifissural upper right lung without discrete residual measurable nodule in this location. Right hilar/mediastinal lymphadenopathy stable to decreased. Tiny 0.2 cm left lower lobe pulmonary nodule slightly decreased. Positive response to treatment. No new or progressive disease. Three-vessel coronary arthrosclerosis. Aortic atherosclerosis/emphysema.    05/19/2021 -  Chemotherapy   Patient is on Treatment Plan : LUNG Durvalumab (10) q14d     07/25/2021 Imaging   CT showed 1. Questionable slight enlargement a small left lower lobe nodule measuring up to 5 mm of the sagittal images compared with the most recent prior study. Attention on follow-up in approximately 6 months recommended.2. Otherwise stable chest CT with stable radiation changes superiorly in the right hemithorax and small residual right hilar lymph node.3. Coronary and aortic atherosclerosis (ICD10-I70.0). Emphysema   10/26/2021 Imaging   CT chest abdomen pelvis w contrast 1. Bandlike consolidative changes in the posterior RIGHT upper lobe appear more organized at the periphery. No focal masslike characteristics. Findings are compatible with evolving post treatment changes. Findings are favored to represent evolving post treatment changes. 2. Nodule in the LEFT lower lobe a 4 mm may be very slowly increasing in size over time, certainly as compared to March of 2023. Continued attention on follow-up is suggested. 3. Stable 9 mm RIGHT hilar lymph node. 4. Evidence of sacral insufficiency fractures not substantially changed. 5. Pulmonary emphysema and aortic atherosclerosis.     01/31/2022 Imaging  CT chest w contrast 1. Nodular areas  developing of bandlike post treatment changes are of uncertain significance. While potentially evolving post treatment changes increasingly nodular foci amidst expected consolidative changes a mildly suspicious. PET imaging or continued short interval follow-up could be considered. 2. New small irregular nodules in the RIGHT lower lobe measuring 3-4 mm with some indistinct adjacent septal thickening. These are nonspecific and could be infectious or inflammatory. Particularly given the presence of material in the RIGHT lower lobe bronchus. 3. Stable 4 mm LEFT lower lobe pulmonary nodule. 4. Three-vessel coronary artery calcification as on previous imaging quite extensive. 5. Emphysema and aortic atherosclerosis.  Aortic Atherosclerosis and Emphysema   02/12/2022 Imaging   CT abdomen pelvis wo contrast Large stool burden throughout the colon suggesting constipation. Aortoiliac atherosclerosis. No acute findings.   Cervical cancer ([place])  02/03/2019 Initial Diagnosis   Malignant neoplasm of cervix ([place])   02/20/2019 - 03/20/2019 Chemotherapy   Patient is on Treatment Plan : Carboplatin q7d w/ XRT     02/14/2021 - 03/28/2021 Chemotherapy   Concurrent chemotherapy and Radiation  LUNG Carboplatin / Paclitaxel + XRT q7d         INTERVAL HISTORY Mary Hoover is a 77 y.o. female who has above history reviewed by me today presents for follow up visit for management of possible recurrent lung cancer.  History of cervix cancer.   She tolerates immunotherapy well.  Back pain is controlled with pain medication. Nausea has been better, she takes PRN home antiemetics. Appetite is not good  Denies any aspiration episodes after eating.  She has chronic cough.   Review of Systems  Constitutional:  Positive for appetite change, fatigue and unexpected weight change. Negative for chills and fever.  HENT:   Positive for hearing loss. Negative for voice change.   Eyes:  Negative for eye problems.   Respiratory:  Positive for cough. Negative for chest tightness.   Cardiovascular:  Negative for chest pain.  Gastrointestinal:  Positive for nausea. Negative for abdominal distention, abdominal pain and blood in stool.  Endocrine: Negative for hot flashes.  Genitourinary:  Negative for difficulty urinating and frequency.   Musculoskeletal:  Negative for arthralgias.  Skin:  Negative for itching and rash.  Neurological:  Negative for extremity weakness.  Hematological:  Negative for adenopathy.  Psychiatric/Behavioral:  Negative for confusion.     MEDICAL HISTORY:  Past Medical History:  Diagnosis Date   Arthritis    Cancer ([place])    Carotid atherosclerosis    Carotid bruit    Cervical cancer (HCC)    COPD (chronic obstructive pulmonary disease) (HCC)    emphysema   Coronary artery disease    Family history of adverse reaction to anesthesia    paternal grandfather died during surgery-pt unaware what happened   GERD (gastroesophageal reflux disease)    Hyperlipidemia    Hypertension    Hyponatremia 02/27/2019   Hypothyroidism    Infected cat bite 1980s   was hospitalized   Irregular heartbeat    Medical history non-contributory    Peripheral vascular disease ([place])    Renal insufficiency    Tobacco use     SURGICAL HISTORY: Past Surgical History:  Procedure Laterality Date   CAROTID ENDARTERECTOMY Right Oct. 2015   Dr. Lucky Cowboy   CAROTID STENOSIS Left April 2016   carotid stenosis surgery   COLONOSCOPY WITH PROPOFOL N/A 11/08/2016   Procedure: COLONOSCOPY WITH PROPOFOL;  Surgeon: Jonathon Bellows, MD;  [facility] ENDOSCOPY;  Service: Gastroenterology;  Laterality: N/A;   ESOPHAGOGASTRODUODENOSCOPY (EGD) WITH PROPOFOL N/A 11/08/2016   Procedure: ESOPHAGOGASTRODUODENOSCOPY (EGD) WITH PROPOFOL;  Surgeon: Jonathon Bellows, MD;  Location: [facility] ENDOSCOPY;  Service: Gastroenterology;  Laterality: N/A;   FLEXIBLE BRONCHOSCOPY N/A 12/17/2014   Procedure: FLEXIBLE BRONCHOSCOPY;  Surgeon:  Wilhelmina Mcardle, MD;  Location: [facility] ORS;  Service: Pulmonary;  Laterality: N/A;   INCISION AND DRAINAGE / EXCISION THYROGLOSSAL CYST  April 2011   PORTA CATH INSERTION N/A 03/03/2021   Procedure: PORTA CATH INSERTION;  Surgeon: Algernon Huxley, MD;  Location: [place] CV LAB;  Service: Cardiovascular;  Laterality: N/A;   VIDEO BRONCHOSCOPY WITH ENDOBRONCHIAL ULTRASOUND N/A 01/12/2021   Procedure: VIDEO BRONCHOSCOPY WITH ENDOBRONCHIAL ULTRASOUND;  Surgeon: Tyler Pita, MD;  Location: [facility] ORS;  Service: Cardiopulmonary;  Laterality: N/A;    SOCIAL HISTORY: Social History   Socioeconomic History   Marital status: Married    Spouse name: Deidre Ala   Number of children: Not on file   Years of education: Not on file   Highest education level: 9th grade  Occupational History   Occupation: retired   Tobacco Use   Smoking status: Every Day    Packs/day: 0.50    Years: 50.00    Total pack years: 25.00    Types: Cigarettes   Smokeless tobacco: Never   Tobacco comments:    0.5 PPD 02/10/2021  Vaping Use   Vaping Use: Former  Substance and Sexual Activity   Alcohol use: No    Alcohol/week: 0.0 standard drinks of alcohol   Drug use: No   Sexual activity: Yes  Other Topics Concern   Not on file  Social History Narrative   Lives at home with husband   Social Determinants of Health   Financial Resource Strain: Low Risk  (03/15/2021)   Overall Financial Resource Strain (CARDIA)    Difficulty of Paying Living Expenses: Not hard at all  Food Insecurity: No Food Insecurity (03/15/2021)   Hunger Vital Sign    Worried About Running Out of Food in the Last Year: Never true    [place] in the Last Year: Never true  Transportation Needs: No Transportation Needs (03/15/2021)   PRAPARE - Hydrologist (Medical): No    Lack of Transportation (Non-Medical): No  Physical Activity: Inactive (03/15/2021)   Exercise Vital Sign    Days of Exercise per Week: 0  days    Minutes of Exercise per Session: 0 min  Stress: No Stress Concern Present (03/15/2021)   [place]    Feeling of Stress : Not at all  Social Connections: Moderately Isolated (03/15/2021)   Social Connection and Isolation Panel [NHANES]    Frequency of Communication with Friends and Family: More than three times a week    Frequency of Social Gatherings with Friends and Family: More than three times a week    Attends Religious Services: Never    Marine scientist or Organizations: No    Attends Archivist Meetings: Never    Marital Status: Married  Human resources officer Violence: Not At Risk (03/15/2021)   Humiliation, Afraid, Rape, and Kick questionnaire    Fear of Current or Ex-Partner: No    Emotionally Abused: No    Physically Abused: No    Sexually Abused: No    FAMILY HISTORY: Family History  Problem Relation Age of Onset   Congestive Heart Failure Mother    Heart disease Mother  Hypertension Mother    Hypertension Sister    Diabetes Sister    Heart disease Sister    Heart disease Maternal Uncle    Heart disease Maternal Grandmother    Stroke Maternal Grandfather    Cancer Brother        liver, lung   Heart disease Brother    Hypertension Brother    Heart attack Brother    COPD Neg Hx    Breast cancer Neg Hx     ALLERGIES:  is allergic to atorvastatin.  MEDICATIONS:  Current Outpatient Medications  Medication Sig Dispense Refill   acetaminophen (TYLENOL) 500 MG tablet Take 500 mg by mouth every 6 (six) hours as needed for moderate pain.     albuterol (VENTOLIN HFA) 108 (90 Base) MCG/ACT inhaler Inhale 2 puffs into the lungs every 6 (six) hours as needed for wheezing or shortness of breath. 8 g 6   alendronate (FOSAMAX) 70 MG tablet TAKE 1 TABLET EVERY 7 DAYS . TAKE WITH A FULL GLASS OF WATER ON AN EMPTY STOMACH. 12 tablet 3   chlorhexidine (PERIDEX) 0.12 % solution Use as directed  5 mLs in the mouth or throat 2 (two) times daily. 120 mL 0   clopidogrel (PLAVIX) 75 MG tablet Take 1 tablet (75 mg total) by mouth daily. 90 tablet 1   cyclobenzaprine (FLEXERIL) 10 MG tablet TAKE 1 TABLET AT BEDTIME 90 tablet 1   docusate sodium (COLACE) 100 MG capsule Take 1 capsule (100 mg total) by mouth daily. 30 capsule 4   gabapentin (NEURONTIN) 100 MG capsule Take 1 capsule (100 mg total) by mouth 3 (three) times daily. 270 capsule 1   Iron-Vitamin C 65-125 MG TABS Take 1 tablet by mouth daily. 30 tablet 3   levothyroxine (SYNTHROID) 88 MCG tablet Take 1 tablet (88 mcg total) by mouth daily. 90 tablet 3   lidocaine (LIDODERM) 5 % Place 1 patch onto the skin daily. Remove & Discard patch within 12 hours or as directed by MD 90 patch 1   lidocaine-prilocaine (EMLA) cream Apply 1 application topically as needed. 60 g 3   lisinopril (ZESTRIL) 10 MG tablet Take 1 tablet (10 mg total) by mouth daily. 30 tablet 1   Multiple Vitamin (MULTIVITAMIN) tablet Take 1 tablet by mouth daily.     omeprazole (PRILOSEC) 20 MG capsule TAKE 1 CAPSULE EVERY DAY 90 capsule 3   ondansetron (ZOFRAN) 8 MG tablet Take 1 tablet (8 mg total) by mouth every 8 (eight) hours as needed for refractory nausea / vomiting. Start on day 3 after chemo. 30 tablet 1   prochlorperazine (COMPAZINE) 10 MG tablet Take 1 tablet (10 mg total) by mouth every 6 (six) hours as needed for nausea or vomiting. 30 tablet 0   traMADol (ULTRAM) 50 MG tablet Take 1-2 tablets (50-100 mg total) by mouth every 12 (twelve) hours as needed. 180 tablet 0   vitamin B-12 (CYANOCOBALAMIN) 500 MCG tablet Take 500 mcg by mouth daily.     Fluticasone-Umeclidin-Vilant (TRELEGY ELLIPTA) 100-62.5-25 MCG/ACT AEPB Inhale 1 puff into the lungs daily. (Patient not taking: Reported on 03/15/2022) 1 each 11   No current facility-administered medications for this visit.   Facility-Administered Medications Ordered in Other Visits  Medication Dose Route Frequency  Provider Last Rate Last Admin   0.9 %  sodium chloride infusion   Intravenous Continuous [name], MD   Stopped at 03/15/22 1036   sodium chloride flush (NS) 0.9 % injection 10 mL  10 mL  Intravenous PRN [name], MD   10 mL at 03/15/22 0841   sodium chloride flush (NS) 0.9 % injection 10 mL  10 mL Intracatheter PRN [name], MD   10 mL at 03/15/22 1151     PHYSICAL EXAMINATION: ECOG PERFORMANCE STATUS: 1 - Symptomatic but completely ambulatory Vitals:   03/15/22 0853  BP: (!) 95/46  Pulse: 98  Temp: (!) 96.7 F (35.9 C)  SpO2: 100%    Filed Weights   03/15/22 0853  Weight: 127 lb 14.4 oz (58 kg)     Physical Exam Constitutional:      General: She is not in acute distress. HENT:     Head: Normocephalic and atraumatic.  Eyes:     General: No scleral icterus.    Pupils: Pupils are equal, round, and reactive to light.  Cardiovascular:     Rate and Rhythm: Normal rate.  Pulmonary:     Effort: Pulmonary effort is normal. No respiratory distress.     Breath sounds: No wheezing.     Comments: Decreased breath sound bilaterally  Abdominal:     General: Bowel sounds are normal. There is no distension.     Palpations: Abdomen is soft.  Musculoskeletal:        General: No deformity. Normal range of motion.     Cervical back: Normal range of motion and neck supple.     Comments: Trace edema bilaterally  Skin:    General: Skin is warm and dry.  Neurological:     Mental Status: She is alert and oriented to person, place, and time. Mental status is at baseline.     Cranial Nerves: No cranial nerve deficit.     Coordination: Coordination normal.  Psychiatric:        Mood and Affect: Mood normal.      LABORATORY DATA:  I have reviewed the data as listed    Latest Ref Rng & Units 03/15/2022    8:42 AM 03/01/2022    8:58 AM 02/20/2022    3:40 PM  CBC  WBC 4.0 - 10.5 K/uL 5.7  5.9  7.3   Hemoglobin 12.0 - 15.0 g/dL 9.2  8.8  9.8   Hematocrit 36.0 - 46.0 % 27.4  26.7  29.1    Platelets 150 - 400 K/uL 280  296  480       Latest Ref Rng & Units 03/15/2022    8:42 AM 03/01/2022    8:58 AM 02/20/2022    3:40 PM  CMP  Glucose 70 - 99 mg/dL 99  103  93   BUN 8 - 23 mg/dL 15  13  9    Creatinine 0.44 - 1.00 mg/dL 1.14  1.30  1.34   Sodium 135 - 145 mmol/L 132  131  135   Potassium 3.5 - 5.1 mmol/L 4.2  4.1  4.7   Chloride 98 - 111 mmol/L 100  99  97   CO2 22 - 32 mmol/L 23  22  20    Calcium 8.9 - 10.3 mg/dL 9.2  8.8  10.0   Total Protein 6.5 - 8.1 g/dL 6.6  6.6  6.5   Total Bilirubin 0.3 - 1.2 mg/dL <0.1  0.4  <0.2   Alkaline Phos 38 - 126 U/L 74  70  100   AST 15 - 41 U/L 17  18  13    ALT 0 - 44 U/L 9  8  11       Iron/TIBC/Ferritin/ %Sat    Component  Value Date/Time   IRON 64 03/01/2022 1007   TIBC 237 (L) 03/01/2022 1007   FERRITIN 90 03/01/2022 1007   IRONPCTSAT 27 03/01/2022 1007      RADIOGRAPHIC STUDIES: I have personally reviewed the radiological images as listed and agreed with the findings in the report. CT ABDOMEN PELVIS W CONTRAST  Result Date: 02/12/2022 CLINICAL DATA:  Abdominal pain, acute, nonlocalized EXAM: CT ABDOMEN AND PELVIS WITH CONTRAST TECHNIQUE: Multidetector CT imaging of the abdomen and pelvis was performed using the standard protocol following bolus administration of intravenous contrast. RADIATION DOSE REDUCTION: This exam was performed according to the departmental dose-optimization program which includes automated exposure control, adjustment of the mA and/or kV according to patient size and/or use of iterative reconstruction technique. CONTRAST:  87mL OMNIPAQUE IOHEXOL 300 MG/ML  SOLN COMPARISON:  10/25/2021 FINDINGS: Lower chest: No acute abnormality Hepatobiliary: No focal hepatic abnormality. Gallbladder unremarkable. Pancreas: No focal abnormality or ductal dilatation. Spleen: No focal abnormality.  Normal size. Adrenals/Urinary Tract: No adrenal abnormality. No focal renal abnormality. No stones or hydronephrosis. Urinary  bladder is unremarkable. Stomach/Bowel: Large stool burden throughout the colon suggesting constipation. Stomach and small bowel decompressed. Vascular/Lymphatic: Diffuse aortoiliac atherosclerosis. No evidence of aneurysm or adenopathy. Reproductive: Uterus and adnexa unremarkable.  No mass. Other: No free fluid or free air. Musculoskeletal: No acute bony abnormality. IMPRESSION: Large stool burden throughout the colon suggesting constipation. Aortoiliac atherosclerosis. No acute findings. Electronically Signed   By: Rolm Baptise M.D.   On: 02/12/2022 18:06   CT Chest W Contrast  Result Date: 02/01/2022 CLINICAL DATA:  77 year old female presents for follow-up of lung cancer. * Tracking Code: BO * EXAM: CT CHEST WITH CONTRAST TECHNIQUE: Multidetector CT imaging of the chest was performed during intravenous contrast administration. RADIATION DOSE REDUCTION: This exam was performed according to the departmental dose-optimization program which includes automated exposure control, adjustment of the mA and/or kV according to patient size and/or use of iterative reconstruction technique. CONTRAST:  9mL OMNIPAQUE IOHEXOL 300 MG/ML  SOLN COMPARISON:  October 25, 2021 FINDINGS: Cardiovascular: Calcified and noncalcified aortic atherosclerotic plaque. Normal heart size. Three-vessel coronary artery calcification as on previous imaging quite extensive. LEFT-sided IJ Port-A-Cath terminates in the mid RIGHT atrium. Central pulmonary vasculature is unremarkable on venous phase. Mediastinum/Nodes: Mild generalized thickening of the esophagus is stable and likely post treatment related. RIGHT hilar lymph node (image 65/2) 8 mm, unchanged. No new signs of adenopathy in the chest. Lungs/Pleura: Pulmonary emphysema as on previous imaging. Material in the dependent aspect of the bronchus intermedius in the RIGHT chest. Bandlike consolidative changes extend from the RIGHT hilum into the peripheral RIGHT chest. Associated septal  thickening and bandlike changes are more pronounced than on previous imaging. Nodular area amidst bandlike changes at the site of previous nodule measuring 7 mm greatest craniocaudal extent previously greatest area of more bandlike and non nodular change at 5 mm greatest thickness, another area on image 78/5 measuring 7 mm as well. More peripheral area of nodularity (image 67/5 appears bandlike with some thickening medially. LEFT lower lobe pulmonary nodule stable at 4 mm (image 101/3) New small irregular nodules in the RIGHT lower lobe (image 91/3 and image 88/3 measuring 3-4 mm with some indistinct adjacent septal thickening. Mild apical septal thickening generalized throughout the LEFT and RIGHT upper lobes greatest on the RIGHT similar to previous imaging. Upper Abdomen: Incidental imaging of upper abdominal contents without acute process. Musculoskeletal: No chest wall mass.  No acute bone finding. IMPRESSION: 1. Nodular areas  developing of bandlike post treatment changes are of uncertain significance. While potentially evolving post treatment changes increasingly nodular foci amidst expected consolidative changes a mildly suspicious. PET imaging or continued short interval follow-up could be considered. 2. New small irregular nodules in the RIGHT lower lobe measuring 3-4 mm with some indistinct adjacent septal thickening. These are nonspecific and could be infectious or inflammatory. Particularly given the presence of material in the RIGHT lower lobe bronchus. 3. Stable 4 mm LEFT lower lobe pulmonary nodule. 4. Three-vessel coronary artery calcification as on previous imaging quite extensive. 5. Emphysema and aortic atherosclerosis. Aortic Atherosclerosis (ICD10-I70.0) and Emphysema (ICD10-J43.9). Electronically Signed   By: Zetta Bills M.D.   On: 02/01/2022 14:57

## 2022-03-15 NOTE — Assessment & Plan Note (Signed)
Intermittent chronic problem for patient, improved.  Continue Zofran and Compazine as needed.

## 2022-03-15 NOTE — Patient Instructions (Signed)
[facility]  Discharge Instructions: Thank you for choosing [facility] to provide your oncology and hematology care.  If you have a lab appointment with the [facility], please go directly to the [facility] and check in at the registration area.  Wear comfortable clothing and clothing appropriate for easy access to any Portacath or PICC line.   We strive to give you quality time with your provider. You may need to reschedule your appointment if you arrive late (15 or more minutes).  Arriving late affects you and other patients whose appointments are after yours.  Also, if you miss three or more appointments without notifying the office, you may be dismissed from the clinic at the provider's discretion.      For prescription refill requests, have your pharmacy contact our office and allow 72 hours for refills to be completed.     To help prevent nausea and vomiting after your treatment, we encourage you to take your nausea medication as directed.  BELOW ARE SYMPTOMS THAT SHOULD BE REPORTED IMMEDIATELY: *FEVER GREATER THAN 100.4 F (38 C) OR HIGHER *CHILLS OR SWEATING *NAUSEA AND VOMITING THAT IS NOT CONTROLLED WITH YOUR NAUSEA MEDICATION *UNUSUAL SHORTNESS OF BREATH *UNUSUAL BRUISING OR BLEEDING *URINARY PROBLEMS (pain or burning when urinating, or frequent urination) *BOWEL PROBLEMS (unusual diarrhea, constipation, pain near the anus) TENDERNESS IN MOUTH AND THROAT WITH OR WITHOUT PRESENCE OF ULCERS (sore throat, sores in mouth, or a toothache) UNUSUAL RASH, SWELLING OR PAIN  UNUSUAL VAGINAL DISCHARGE OR ITCHING   Items with * indicate a potential emergency and should be followed up as soon as possible or go to the Emergency Department if any problems should occur.  Please show the CHEMOTHERAPY ALERT CARD or IMMUNOTHERAPY ALERT CARD at check-in to the Emergency Department and triage nurse.  Should you have questions after your visit  or need to cancel or reschedule your appointment, please contact [facility]  [phone] and follow the prompts.  Office hours are 8:00 a.m. to 4:30 p.m. Monday - Friday. Please note that voicemails left after 4:00 p.m. may not be returned until the following business day.  We are closed weekends and major holidays. You have access to a nurse at all times for urgent questions. Please call the main number to the clinic [phone] and follow the prompts.  For any non-urgent questions, you may also contact your provider using MyChart. We now offer e-Visits for anyone 40 and older to request care online for non-urgent symptoms. For details visit [email or web address].   Also download the MyChart app! Go to the app store, search "MyChart", open the app, select [facility], and log in with your MyChart username and password.  Masks are optional in the cancer centers. If you would like for your care team to wear a mask while they are taking care of you, please let them know. For doctor visits, patients may have with them one support person who is at least 75 years old. At this time, visitors are not allowed in the infusion area.

## 2022-03-15 NOTE — Assessment & Plan Note (Signed)
Treatment plan was listed above

## 2022-03-15 NOTE — Assessment & Plan Note (Addendum)
Creatinine improved. BP is low, will give her 500cc IVF NS today Avoid nephrotoxins. Encourage oral hydration.

## 2022-03-15 NOTE — Assessment & Plan Note (Signed)
Stage III right lung adenocarcinoma,  Recurrent S/p concurrent chemotherapy and radiation- On immunotherapy durvalumab maintenance Labs reviewed and discussed with patient.  Proceed with Durvalumab treatments Surveillance CT showed post RT changes. New small right LLL 3-33mm nodule- attention on follow up ?aspiration, refer to speech.swallow evaluation.

## 2022-03-15 NOTE — Assessment & Plan Note (Signed)
Recommend smoke cessation.  

## 2022-03-16 ENCOUNTER — Inpatient Hospital Stay: Payer: Medicare PPO

## 2022-03-16 LAB — ACTH: C206 ACTH: 12.2 pg/mL (ref 7.2–63.3)

## 2022-03-23 ENCOUNTER — Inpatient Hospital Stay: Payer: Medicare PPO

## 2022-03-23 VITALS — BP 93/49 | HR 91 | Temp 97.3°F | Resp 16

## 2022-03-23 DIAGNOSIS — I129 Hypertensive chronic kidney disease with stage 1 through stage 4 chronic kidney disease, or unspecified chronic kidney disease: Secondary | ICD-10-CM | POA: Diagnosis not present

## 2022-03-23 DIAGNOSIS — Z79899 Other long term (current) drug therapy: Secondary | ICD-10-CM | POA: Diagnosis not present

## 2022-03-23 DIAGNOSIS — M549 Dorsalgia, unspecified: Secondary | ICD-10-CM | POA: Diagnosis not present

## 2022-03-23 DIAGNOSIS — D631 Anemia in chronic kidney disease: Secondary | ICD-10-CM | POA: Diagnosis not present

## 2022-03-23 DIAGNOSIS — F17210 Nicotine dependence, cigarettes, uncomplicated: Secondary | ICD-10-CM | POA: Diagnosis not present

## 2022-03-23 DIAGNOSIS — C3491 Malignant neoplasm of unspecified part of right bronchus or lung: Secondary | ICD-10-CM | POA: Diagnosis not present

## 2022-03-23 DIAGNOSIS — E871 Hypo-osmolality and hyponatremia: Secondary | ICD-10-CM

## 2022-03-23 DIAGNOSIS — Z5112 Encounter for antineoplastic immunotherapy: Secondary | ICD-10-CM | POA: Diagnosis not present

## 2022-03-23 MED ORDER — SODIUM CHLORIDE 0.9 % IV SOLN
200.0000 mg | Freq: Once | INTRAVENOUS | Status: AC
  Administered 2022-03-23: 200 mg via INTRAVENOUS
  Filled 2022-03-23: qty 200

## 2022-03-23 MED ORDER — HEPARIN SOD (PORK) LOCK FLUSH 100 UNIT/ML IV SOLN
500.0000 [IU] | Freq: Once | INTRAVENOUS | Status: AC | PRN
  Administered 2022-03-23: 500 [IU]
  Filled 2022-03-23: qty 5

## 2022-03-23 MED ORDER — SODIUM CHLORIDE 0.9 % IV SOLN
Freq: Once | INTRAVENOUS | Status: AC
  Filled 2022-03-23: qty 250

## 2022-03-23 NOTE — Progress Notes (Signed)
Pt has been educated and understand. Pt refused to stay 30 mins after iron infusion. VSS.

## 2022-03-29 ENCOUNTER — Encounter: Payer: Self-pay | Admitting: Oncology

## 2022-03-29 ENCOUNTER — Inpatient Hospital Stay: Payer: Medicare PPO

## 2022-03-29 ENCOUNTER — Inpatient Hospital Stay (HOSPITAL_BASED_OUTPATIENT_CLINIC_OR_DEPARTMENT_OTHER): Payer: Medicare PPO | Admitting: Oncology

## 2022-03-29 VITALS — BP 106/51 | HR 90 | Temp 96.4°F | Resp 18 | Wt 128.5 lb

## 2022-03-29 DIAGNOSIS — Z720 Tobacco use: Secondary | ICD-10-CM

## 2022-03-29 DIAGNOSIS — G8929 Other chronic pain: Secondary | ICD-10-CM

## 2022-03-29 DIAGNOSIS — M545 Low back pain, unspecified: Secondary | ICD-10-CM | POA: Diagnosis not present

## 2022-03-29 DIAGNOSIS — Z79899 Other long term (current) drug therapy: Secondary | ICD-10-CM | POA: Diagnosis not present

## 2022-03-29 DIAGNOSIS — C3491 Malignant neoplasm of unspecified part of right bronchus or lung: Secondary | ICD-10-CM | POA: Diagnosis not present

## 2022-03-29 DIAGNOSIS — N1831 Chronic kidney disease, stage 3a: Secondary | ICD-10-CM | POA: Diagnosis not present

## 2022-03-29 DIAGNOSIS — E871 Hypo-osmolality and hyponatremia: Secondary | ICD-10-CM

## 2022-03-29 DIAGNOSIS — M549 Dorsalgia, unspecified: Secondary | ICD-10-CM | POA: Diagnosis not present

## 2022-03-29 DIAGNOSIS — F17210 Nicotine dependence, cigarettes, uncomplicated: Secondary | ICD-10-CM | POA: Diagnosis not present

## 2022-03-29 DIAGNOSIS — Z5112 Encounter for antineoplastic immunotherapy: Secondary | ICD-10-CM | POA: Diagnosis not present

## 2022-03-29 DIAGNOSIS — D631 Anemia in chronic kidney disease: Secondary | ICD-10-CM | POA: Diagnosis not present

## 2022-03-29 DIAGNOSIS — I129 Hypertensive chronic kidney disease with stage 1 through stage 4 chronic kidney disease, or unspecified chronic kidney disease: Secondary | ICD-10-CM | POA: Diagnosis not present

## 2022-03-29 LAB — CBC WITH DIFFERENTIAL/PLATELET
Abs Immature Granulocytes: 0.03 10*3/uL (ref 0.00–0.07)
Basophils Absolute: 0 10*3/uL (ref 0.0–0.1)
Basophils Relative: 1 %
Eosinophils Absolute: 0.1 10*3/uL (ref 0.0–0.5)
Eosinophils Relative: 2 %
HCT: 28.3 % — ABNORMAL LOW (ref 36.0–46.0)
Hemoglobin: 9.4 g/dL — ABNORMAL LOW (ref 12.0–15.0)
Immature Granulocytes: 1 %
Lymphocytes Relative: 10 %
Lymphs Abs: 0.7 10*3/uL (ref 0.7–4.0)
MCH: 32.1 pg (ref 26.0–34.0)
MCHC: 33.2 g/dL (ref 30.0–36.0)
MCV: 96.6 fL (ref 80.0–100.0)
Monocytes Absolute: 0.5 10*3/uL (ref 0.1–1.0)
Monocytes Relative: 8 %
Neutro Abs: 5 10*3/uL (ref 1.7–7.7)
Neutrophils Relative %: 78 %
Platelets: 256 10*3/uL (ref 150–400)
RBC: 2.93 MIL/uL — ABNORMAL LOW (ref 3.87–5.11)
RDW: 14.6 % (ref 11.5–15.5)
WBC: 6.3 10*3/uL (ref 4.0–10.5)
nRBC: 0 % (ref 0.0–0.2)

## 2022-03-29 LAB — COMPREHENSIVE METABOLIC PANEL
ALT: 12 U/L (ref 0–44)
AST: 21 U/L (ref 15–41)
Albumin: 3.4 g/dL — ABNORMAL LOW (ref 3.5–5.0)
Alkaline Phosphatase: 66 U/L (ref 38–126)
Anion gap: 8 (ref 5–15)
BUN: 18 mg/dL (ref 8–23)
CO2: 23 mmol/L (ref 22–32)
Calcium: 8.8 mg/dL — ABNORMAL LOW (ref 8.9–10.3)
Chloride: 101 mmol/L (ref 98–111)
Creatinine, Ser: 1.25 mg/dL — ABNORMAL HIGH (ref 0.44–1.00)
GFR, Estimated: 45 mL/min — ABNORMAL LOW (ref >60)
Glucose, Bld: 109 mg/dL — ABNORMAL HIGH (ref 70–99)
Potassium: 4.2 mmol/L (ref 3.5–5.1)
Sodium: 132 mmol/L — ABNORMAL LOW (ref 135–145)
Total Bilirubin: 0.2 mg/dL — ABNORMAL LOW (ref 0.3–1.2)
Total Protein: 6.8 g/dL (ref 6.5–8.1)

## 2022-03-29 MED ORDER — HEPARIN SOD (PORK) LOCK FLUSH 100 UNIT/ML IV SOLN
500.0000 [IU] | Freq: Once | INTRAVENOUS | Status: AC | PRN
  Administered 2022-03-29: 500 [IU]
  Filled 2022-03-29: qty 5

## 2022-03-29 MED ORDER — SODIUM CHLORIDE 0.9 % IV SOLN
Freq: Once | INTRAVENOUS | Status: AC
  Filled 2022-03-29: qty 250

## 2022-03-29 MED ORDER — SODIUM CHLORIDE 0.9% FLUSH
10.0000 mL | INTRAVENOUS | Status: DC | PRN
  Administered 2022-03-29: 10 mL
  Filled 2022-03-29: qty 10

## 2022-03-29 MED ORDER — SODIUM CHLORIDE 0.9 % IV SOLN
10.0000 mg/kg | Freq: Once | INTRAVENOUS | Status: AC
  Administered 2022-03-29: 620 mg via INTRAVENOUS
  Filled 2022-03-29: qty 10

## 2022-03-29 MED ORDER — SODIUM CHLORIDE 0.9 % IV SOLN
200.0000 mg | Freq: Once | INTRAVENOUS | Status: AC
  Administered 2022-03-29: 200 mg via INTRAVENOUS
  Filled 2022-03-29: qty 10

## 2022-03-29 NOTE — Assessment & Plan Note (Addendum)
Stage III right lung adenocarcinoma,  Recurrent S/p concurrent chemotherapy and radiation- On immunotherapy durvalumab maintenance Labs reviewed and discussed with patient.  Proceed with Durvalumab treatments Surveillance CT showed post RT changes. New small right LLL 3-66m nodule- attention on follow up

## 2022-03-29 NOTE — Assessment & Plan Note (Signed)
Hemoglobin is worse despite being on oral iron supplementation.  Ferritin <200.  To finish course of IV venofer x total 3 doses.

## 2022-03-29 NOTE — Progress Notes (Signed)
Nutrition Follow-up:  Patient with lung cancer and history of cervical cancer.  Patient receiving immunotherapy for lung cancer.    Met with patient during infusion. Does not have much appetite but trying to eat.   Says that she has been snacking more.  Used list RD gave her last time to purchase cottage cheese and fruit, cheese and crackers and peanut butter and graham crackers. "I am not going to eat anything if I don't like it."      Medications: reviewed  Labs: reviewed  Anthropometrics:   Weight 128 lb 8 oz today 126 lb 12.8 oz on 1/31 134 lb 8 oz on 11/1 135 lb on 9/22   NUTRITION DIAGNOSIS: Inadequate oral intake stable   INTERVENTION:  Encouraged patient to continue snacking for added calories and protein     MONITORING, EVALUATION, GOAL: weight trends, intake   NEXT VISIT: Wednesday, Mar 27 during infusion  Riese Hellard B. Zenia Resides, [place], [place] Registered Dietitian [phone]

## 2022-03-29 NOTE — Patient Instructions (Signed)
[facility]  Discharge Instructions: Thank you for choosing [facility] to provide your oncology and hematology care.  If you have a lab appointment with the [facility], please go directly to the [facility] and check in at the registration area.  Wear comfortable clothing and clothing appropriate for easy access to any Portacath or PICC line.   We strive to give you quality time with your provider. You may need to reschedule your appointment if you arrive late (15 or more minutes).  Arriving late affects you and other patients whose appointments are after yours.  Also, if you miss three or more appointments without notifying the office, you may be dismissed from the clinic at the provider's discretion.      For prescription refill requests, have your pharmacy contact our office and allow 72 hours for refills to be completed.    Today you received the following chemotherapy and/or immunotherapy agents: durvalumab    To help prevent nausea and vomiting after your treatment, we encourage you to take your nausea medication as directed.  BELOW ARE SYMPTOMS THAT SHOULD BE REPORTED IMMEDIATELY: *FEVER GREATER THAN 100.4 F (38 C) OR HIGHER *CHILLS OR SWEATING *NAUSEA AND VOMITING THAT IS NOT CONTROLLED WITH YOUR NAUSEA MEDICATION *UNUSUAL SHORTNESS OF BREATH *UNUSUAL BRUISING OR BLEEDING *URINARY PROBLEMS (pain or burning when urinating, or frequent urination) *BOWEL PROBLEMS (unusual diarrhea, constipation, pain near the anus) TENDERNESS IN MOUTH AND THROAT WITH OR WITHOUT PRESENCE OF ULCERS (sore throat, sores in mouth, or a toothache) UNUSUAL RASH, SWELLING OR PAIN  UNUSUAL VAGINAL DISCHARGE OR ITCHING   Items with * indicate a potential emergency and should be followed up as soon as possible or go to the Emergency Department if any problems should occur.  Please show the CHEMOTHERAPY ALERT CARD or IMMUNOTHERAPY ALERT CARD at check-in to  the Emergency Department and triage nurse.  Should you have questions after your visit or need to cancel or reschedule your appointment, please contact [facility]  [phone] and follow the prompts.  Office hours are 8:00 a.m. to 4:30 p.m. Monday - Friday. Please note that voicemails left after 4:00 p.m. may not be returned until the following business day.  We are closed weekends and major holidays. You have access to a nurse at all times for urgent questions. Please call the main number to the clinic [phone] and follow the prompts.  For any non-urgent questions, you may also contact your provider using MyChart. We now offer e-Visits for anyone 105 and older to request care online for non-urgent symptoms. For details visit [email or web address].   Also download the MyChart app! Go to the app store, search "MyChart", open the app, select [facility], and log in with your MyChart username and password.

## 2022-03-29 NOTE — Assessment & Plan Note (Signed)
Treatment plan was listed above

## 2022-03-29 NOTE — Assessment & Plan Note (Signed)
Continue tramadol every 6 hours as needed.

## 2022-03-29 NOTE — Assessment & Plan Note (Signed)
Recommend smoke cessation.  

## 2022-03-29 NOTE — Assessment & Plan Note (Signed)
Creatinine improved. BP is low, will give her 500cc IVF NS today Avoid nephrotoxins. Encourage oral hydration.

## 2022-03-29 NOTE — Progress Notes (Signed)
Hematology/Oncology Progress note Telephone:([phone]) [phone] Fax:(336) [phone]     CHIEF COMPLAINTS/REASON FOR VISIT:  Follow up lung cancer treatments   ASSESSMENT & PLAN:   Cancer Staging  Cervical cancer ([place]) Staging form: Cervix Uteri, AJCC Version 9 - Clinical: FIGO Stage IIICr (cTX, cN1) - Unsigned  Primary lung adenocarcinoma ([place]) Staging form: Lung, AJCC 7th Edition - Clinical stage from 01/12/2021: T1, N1, M0 - Signed by [name], MD on 01/29/2021   Primary lung adenocarcinoma ([place]) Stage III right lung adenocarcinoma,  Recurrent S/p concurrent chemotherapy and radiation- On immunotherapy durvalumab maintenance Labs reviewed and discussed with patient.  Proceed with Durvalumab treatments Surveillance CT showed post RT changes. New small right LLL 3-64m nodule- attention on follow up   Anemia in chronic kidney disease (CODE) Hemoglobin is worse despite being on oral iron supplementation.  Ferritin <200.  To finish course of IV venofer x total 3 doses.   Back pain Continue tramadol every 6 hours as needed.    CKD (chronic kidney disease) stage 3, GFR 30-59 ml/min (HCC) Creatinine improved. BP is low, will give her 500cc IVF NS today Avoid nephrotoxins. Encourage oral hydration.    Encounter for antineoplastic immunotherapy Treatment plan was listed above  Tobacco use Recommend smoke cessation.   Orders Placed This Encounter  Procedures   T4, free    Standing Status:   Future    Standing Expiration Date:   04/13/2023   CBC with Differential    Standing Status:   Future    Standing Expiration Date:   04/13/2023   Comprehensive metabolic panel    Standing Status:   Future    Standing Expiration Date:   04/13/2023   TSH    Standing Status:   Future    Standing Expiration Date:   04/13/2023   CBC with Differential    Standing Status:   Future    Standing Expiration Date:   04/27/2023   Comprehensive metabolic panel    Standing Status:   Future     Standing Expiration Date:   04/27/2023   CBC with Differential    Standing Status:   Future    Standing Expiration Date:   05/11/2023   Comprehensive metabolic panel    Standing Status:   Future    Standing Expiration Date:   05/11/2023   Follow up in 2 weeks.  Lab MD Durvalumab.  All questions were answered. The patient knows to call the clinic with any problems, questions or concerns.  ZEarlie Server MD, PhD [facility] H[facility] Oncology 03/29/2022      HISTORY OF PRESENTING ILLNESS:  # Cervix cancer Patient has developed postmenopausal vaginal bleeding and discharge.  She was noted to have a friable cervix/2 cm mass of the cervix, concerning for malignancy 12/19/2018 endometrial biopsy showed scattered atypical squamous cells suspicious for malignancy.  Predominantly necrosis with associated inflammation. 01/01/2019 initial cervix biopsy showed at least high-grade squamous intraepithelial lesion.-HPV negative 01/22/2019, repeat vaginal wall biopsy and cervix 9:00 biopsy showed squamous cell carcinoma.    Staging images 12/31/2018 showed fluid distending the endometrial canal and or endometrial thickening to the level of cervix.  No evidence of pathological lymphadenopathy.  Haziness about the lower cervix.  7 mm irregular pulmonary nodule within the right upper lobe, suspicious for possible primary or metastatic malignancy.  Chronic pleural-parenchymal scarring/fibrosis at the lung apices.  Large amount of stool.  No bowel obstruction. 01/08/2019 PET scan showed marked hypermetabolism in the region of the cervix, compatible with the  reported history of cervical cancer. Small bilateral pelvic sidewall lymph nodes discernible FDG accumulation, concerning for metastatic disease although neither lymph node is enlarged by CT size criteria. 7 mm nodule in the right upper lobe shows discernible FDG accumulation.  Questionable neoplasm, primary versus metastatic. Emphysema.   # 02/20/2020 -  03/19/2020 concurrent chemotherapy weekly carboplatin and taxol and radiation for treatment of this cervix cancer.   Oncology History  Primary lung adenocarcinoma ([place])  11/20/2020 Imaging   CT chest with contrast showed minimal residual of previously seen right upper lobe nodule. 0.6 x 0.4 cm. Internal development of mild bandlike radiation fibrosis. Newly enlarged pretracheal lymph node, measuring 2.5 x 1.7 cm. Highly concerning for metastatic disease. Unchanged prominent AP window and the right hilar lymph nodes. Emphysema and diffuse bilateral bronchial wall thickening. Coronary artery disease.    12/13/2020 Imaging   PET scan showed enlarging hypermetabolic right lower paratracheal lymph node, compatible with metastatic disease. SUV 10.3. Mild FDG uptake of the bilateral sacral alla with associated lucency. Concerning for sacral insufficiency fracture. Stable posttreatment changes of the right upper lobe nodule. Nonspecific small solid 3 mm pulmonary nodule of the left lower lobe. 2 small to be characterized. No evidence of FDG avid metastatic disease in the abdomen or pelvis. Aortic atherosclerosis and emphysema.    01/12/2021 Initial Diagnosis   12/2019 lung nodules FDG avid, questionable primary bronchogenic carcinoma versus metastatic cancer.Discussed with radiation oncology. Her case was also discussed on tumor board. Consensus reached on finishing concurrent chemoradiation treatments for cervix cancer first. Possible SBRT to lung nodule for presumed primary lung cancer.  She was evaluated by Dr.Oaks. Options of biopsy via transthoracic or endobronchial approach vs surgical resection. Patient opted to empiric SBRT.  She lost follow up with me until Oct 2022   Primary lung adenocarcinoma [facility]) 11/28/22Patient re establish care with me  for lung cancer management.  01/12/2021 patient underwent bronchoscopy biopsy by Dr. Patsey Berthold Right paratracheal lymph node fine-needle aspiration is  positive for metastatic non-small cell carcinoma, favor adenocarcinoma of lung origin.  Circulogen NGS negative PD-L1, ALK, ROS1, NTRK1/2/3,    01/12/2021 Cancer Staging   Staging form: Lung, AJCC 7th Edition - Clinical stage from 01/12/2021: T1, N1, M0 - Signed by [name], MD on 01/29/2021 Laterality: Right Stage used in treatment planning: Yes National guidelines used in treatment planning: Yes Type of national guideline used in treatment planning: NCCN   02/14/2021 - 03/28/2021 Chemotherapy   Concurrent chemotherapy and Radiation  LUNG Carboplatin / Paclitaxel + XRT q7d      04/26/2021 Imaging   CT chest with contrast showed evolving postradiation changes in the posterior perifissural upper right lung without discrete residual measurable nodule in this location. Right hilar/mediastinal lymphadenopathy stable to decreased. Tiny 0.2 cm left lower lobe pulmonary nodule slightly decreased. Positive response to treatment. No new or progressive disease. Three-vessel coronary arthrosclerosis. Aortic atherosclerosis/emphysema.    05/19/2021 -  Chemotherapy   Patient is on Treatment Plan : LUNG Durvalumab (10) q14d     07/25/2021 Imaging   CT showed 1. Questionable slight enlargement a small left lower lobe nodule measuring up to 5 mm of the sagittal images compared with the most recent prior study. Attention on follow-up in approximately 6 months recommended.2. Otherwise stable chest CT with stable radiation changes superiorly in the right hemithorax and small residual right hilar lymph node.3. Coronary and aortic atherosclerosis (ICD10-I70.0). Emphysema   10/26/2021 Imaging   CT chest abdomen pelvis w contrast 1. Bandlike consolidative  changes in the posterior RIGHT upper lobe appear more organized at the periphery. No focal masslike characteristics. Findings are compatible with evolving post treatment changes. Findings are favored to represent evolving post treatment changes. 2. Nodule in the  LEFT lower lobe a 4 mm may be very slowly increasing in size over time, certainly as compared to March of 2023. Continued attention on follow-up is suggested. 3. Stable 9 mm RIGHT hilar lymph node. 4. Evidence of sacral insufficiency fractures not substantially changed. 5. Pulmonary emphysema and aortic atherosclerosis.     01/31/2022 Imaging   CT chest w contrast 1. Nodular areas developing of bandlike post treatment changes are of uncertain significance. While potentially evolving post treatment changes increasingly nodular foci amidst expected consolidative changes a mildly suspicious. PET imaging or continued short interval follow-up could be considered. 2. New small irregular nodules in the RIGHT lower lobe measuring 3-4 mm with some indistinct adjacent septal thickening. These are nonspecific and could be infectious or inflammatory. Particularly given the presence of material in the RIGHT lower lobe bronchus. 3. Stable 4 mm LEFT lower lobe pulmonary nodule. 4. Three-vessel coronary artery calcification as on previous imaging quite extensive. 5. Emphysema and aortic atherosclerosis.  Aortic Atherosclerosis and Emphysema   02/12/2022 Imaging   CT abdomen pelvis wo contrast Large stool burden throughout the colon suggesting constipation. Aortoiliac atherosclerosis. No acute findings.   Cervical cancer ([place])  02/03/2019 Initial Diagnosis   Malignant neoplasm of cervix ([place])   02/20/2019 - 03/20/2019 Chemotherapy   Patient is on Treatment Plan : Carboplatin q7d w/ XRT     02/14/2021 - 03/28/2021 Chemotherapy   Concurrent chemotherapy and Radiation  LUNG Carboplatin / Paclitaxel + XRT q7d         INTERVAL HISTORY KALAH MANSKER is a 77 y.o. female who has above history reviewed by me today presents for follow up visit for management of possible recurrent lung cancer.  History of cervix cancer.   She tolerates immunotherapy well.  Back pain is controlled with pain medication. Nausea  has been better, she takes PRN home antiemetics.   Review of Systems  Constitutional:  Positive for appetite change, fatigue and unexpected weight change. Negative for chills and fever.  HENT:   Positive for hearing loss. Negative for voice change.   Eyes:  Negative for eye problems.  Respiratory:  Positive for cough. Negative for chest tightness.   Cardiovascular:  Negative for chest pain.  Gastrointestinal:  Positive for nausea. Negative for abdominal distention, abdominal pain and blood in stool.  Endocrine: Negative for hot flashes.  Genitourinary:  Negative for difficulty urinating and frequency.   Musculoskeletal:  Negative for arthralgias.  Skin:  Negative for itching and rash.  Neurological:  Negative for extremity weakness.  Hematological:  Negative for adenopathy.  Psychiatric/Behavioral:  Negative for confusion.     MEDICAL HISTORY:  Past Medical History:  Diagnosis Date   Arthritis    Cancer [facility])    Carotid atherosclerosis    Carotid bruit    Cervical cancer (HCC)    COPD (chronic obstructive pulmonary disease) (HCC)    emphysema   Coronary artery disease    Family history of adverse reaction to anesthesia    paternal grandfather died during surgery-pt unaware what happened   GERD (gastroesophageal reflux disease)    Hyperlipidemia    Hypertension    Hyponatremia 02/27/2019   Hypothyroidism    Infected cat bite 1980s   was hospitalized   Irregular heartbeat    Medical  history non-contributory    Peripheral vascular disease ([place])    Renal insufficiency    Tobacco use     SURGICAL HISTORY: Past Surgical History:  Procedure Laterality Date   CAROTID ENDARTERECTOMY Right Oct. 2015   Dr. Lucky Cowboy   CAROTID STENOSIS Left April 2016   carotid stenosis surgery   COLONOSCOPY WITH PROPOFOL N/A 11/08/2016   Procedure: COLONOSCOPY WITH PROPOFOL;  Surgeon: Jonathon Bellows, MD;  Location: [facility];  Service: Gastroenterology;  Laterality: N/A;    ESOPHAGOGASTRODUODENOSCOPY (EGD) WITH PROPOFOL N/A 11/08/2016   Procedure: ESOPHAGOGASTRODUODENOSCOPY (EGD) WITH PROPOFOL;  Surgeon: Jonathon Bellows, MD;  Location[facility] ENDOSCOPY;  Service: Gastroenterology;  Laterality: N/A;   FLEXIBLE BRONCHOSCOPY N/A 12/17/2014   Procedure: FLEXIBLE BRONCHOSCOPY;  Surgeon: Wilhelmina Mcardle, MD;  Location: [facility] ORS;  Service: Pulmonary;  Laterality: N/A;   INCISION AND DRAINAGE / EXCISION THYROGLOSSAL CYST  April 2011   PORTA CATH INSERTION N/A 03/03/2021   Procedure: PORTA CATH INSERTION;  Surgeon: Algernon Huxley, MD;  Location: [place] CV LAB;  Service: Cardiovascular;  Laterality: N/A;   VIDEO BRONCHOSCOPY WITH ENDOBRONCHIAL ULTRASOUND N/A 01/12/2021   Procedure: VIDEO BRONCHOSCOPY WITH ENDOBRONCHIAL ULTRASOUND;  Surgeon: Tyler Pita, MD;  Location: [facility] ORS;  Service: Cardiopulmonary;  Laterality: N/A;    SOCIAL HISTORY: Social History   Socioeconomic History   Marital status: Married    Spouse name: Deidre Ala   Number of children: Not on file   Years of education: Not on file   Highest education level: 9th grade  Occupational History   Occupation: retired   Tobacco Use   Smoking status: Every Day    Packs/day: 0.50    Years: 50.00    Total pack years: 25.00    Types: Cigarettes   Smokeless tobacco: Never   Tobacco comments:    0.5 PPD 02/10/2021  Vaping Use   Vaping Use: Former  Substance and Sexual Activity   Alcohol use: No    Alcohol/week: 0.0 standard drinks of alcohol   Drug use: No   Sexual activity: Yes  Other Topics Concern   Not on file  Social History Narrative   Lives at home with husband   Social Determinants of Health   Financial Resource Strain: Low Risk  (03/15/2021)   Overall Financial Resource Strain (CARDIA)    Difficulty of Paying Living Expenses: Not hard at all  Food Insecurity: No Food Insecurity (03/15/2021)   Hunger Vital Sign    Worried About Running Out of Food in the Last Year: Never true    [place] in the Last Year: Never true  Transportation Needs: No Transportation Needs (03/15/2021)   PRAPARE - Hydrologist (Medical): No    Lack of Transportation (Non-Medical): No  Physical Activity: Inactive (03/15/2021)   Exercise Vital Sign    Days of Exercise per Week: 0 days    Minutes of Exercise per Session: 0 min  Stress: No Stress Concern Present (03/15/2021)   [name]    Feeling of Stress : Not at all  Social Connections: Moderately Isolated (03/15/2021)   Social Connection and Isolation Panel [NHANES]    Frequency of Communication with Friends and Family: More than three times a week    Frequency of Social Gatherings with Friends and Family: More than three times a week    Attends Religious Services: Never    Marine scientist or Organizations: No  Attends Archivist Meetings: Never    Marital Status: Married  Human resources officer Violence: Not At Risk (03/15/2021)   Humiliation, Afraid, Rape, and Kick questionnaire    Fear of Current or Ex-Partner: No    Emotionally Abused: No    Physically Abused: No    Sexually Abused: No    FAMILY HISTORY: Family History  Problem Relation Age of Onset   Congestive Heart Failure Mother    Heart disease Mother    Hypertension Mother    Hypertension Sister    Diabetes Sister    Heart disease Sister    Heart disease Maternal Uncle    Heart disease Maternal Grandmother    Stroke Maternal Grandfather    Cancer Brother        liver, lung   Heart disease Brother    Hypertension Brother    Heart attack Brother    COPD Neg Hx    Breast cancer Neg Hx     ALLERGIES:  is allergic to atorvastatin.  MEDICATIONS:  Current Outpatient Medications  Medication Sig Dispense Refill   acetaminophen (TYLENOL) 500 MG tablet Take 500 mg by mouth every 6 (six) hours as needed for moderate pain.     albuterol (VENTOLIN HFA) 108 (90 Base)  MCG/ACT inhaler Inhale 2 puffs into the lungs every 6 (six) hours as needed for wheezing or shortness of breath. 8 g 6   alendronate (FOSAMAX) 70 MG tablet TAKE 1 TABLET EVERY 7 DAYS . TAKE WITH A FULL GLASS OF WATER ON AN EMPTY STOMACH. 12 tablet 3   chlorhexidine (PERIDEX) 0.12 % solution Use as directed 5 mLs in the mouth or throat 2 (two) times daily. 120 mL 0   clopidogrel (PLAVIX) 75 MG tablet Take 1 tablet (75 mg total) by mouth daily. 90 tablet 1   cyclobenzaprine (FLEXERIL) 10 MG tablet TAKE 1 TABLET AT BEDTIME 90 tablet 1   docusate sodium (COLACE) 100 MG capsule Take 1 capsule (100 mg total) by mouth daily. 30 capsule 4   gabapentin (NEURONTIN) 100 MG capsule Take 1 capsule (100 mg total) by mouth 3 (three) times daily. 270 capsule 1   Iron-Vitamin C 65-125 MG TABS Take 1 tablet by mouth daily. 30 tablet 3   levothyroxine (SYNTHROID) 88 MCG tablet Take 1 tablet (88 mcg total) by mouth daily. 90 tablet 3   lidocaine (LIDODERM) 5 % Place 1 patch onto the skin daily. Remove & Discard patch within 12 hours or as directed by MD 90 patch 1   lidocaine-prilocaine (EMLA) cream Apply 1 application topically as needed. 60 g 3   lisinopril (ZESTRIL) 10 MG tablet Take 1 tablet (10 mg total) by mouth daily. 30 tablet 1   Multiple Vitamin (MULTIVITAMIN) tablet Take 1 tablet by mouth daily.     omeprazole (PRILOSEC) 20 MG capsule TAKE 1 CAPSULE EVERY DAY 90 capsule 3   ondansetron (ZOFRAN) 8 MG tablet Take 1 tablet (8 mg total) by mouth every 8 (eight) hours as needed for refractory nausea / vomiting. Start on day 3 after chemo. 30 tablet 1   prochlorperazine (COMPAZINE) 10 MG tablet Take 1 tablet (10 mg total) by mouth every 6 (six) hours as needed for nausea or vomiting. 30 tablet 0   traMADol (ULTRAM) 50 MG tablet Take 1-2 tablets (50-100 mg total) by mouth every 12 (twelve) hours as needed. 180 tablet 0   vitamin B-12 (CYANOCOBALAMIN) 500 MCG tablet Take 500 mcg by mouth daily.      Fluticasone-Umeclidin-Vilant (  TRELEGY ELLIPTA) 100-62.5-25 MCG/ACT AEPB Inhale 1 puff into the lungs daily. (Patient not taking: Reported on 03/15/2022) 1 each 11   No current facility-administered medications for this visit.   Facility-Administered Medications Ordered in Other Visits  Medication Dose Route Frequency Provider Last Rate Last Admin   sodium chloride flush (NS) 0.9 % injection 10 mL  10 mL Intracatheter PRN [name], MD   10 mL at 03/29/22 1123     PHYSICAL EXAMINATION: ECOG PERFORMANCE STATUS: 1 - Symptomatic but completely ambulatory Vitals:   03/29/22 1035  BP: (!) 106/51  Pulse: 90  Resp: 18  Temp: (!) 96.4 F (35.8 C)  SpO2: 100%    Filed Weights   03/29/22 1035  Weight: 128 lb 8 oz (58.3 kg)     Physical Exam Constitutional:      General: She is not in acute distress. HENT:     Head: Normocephalic and atraumatic.  Eyes:     General: No scleral icterus.    Pupils: Pupils are equal, round, and reactive to light.  Cardiovascular:     Rate and Rhythm: Normal rate.  Pulmonary:     Effort: Pulmonary effort is normal. No respiratory distress.     Breath sounds: No wheezing.     Comments: Decreased breath sound bilaterally  Abdominal:     General: Bowel sounds are normal. There is no distension.     Palpations: Abdomen is soft.  Musculoskeletal:        General: No deformity. Normal range of motion.     Cervical back: Normal range of motion and neck supple.     Comments: Trace edema bilaterally  Skin:    General: Skin is warm and dry.  Neurological:     Mental Status: She is alert and oriented to person, place, and time. Mental status is at baseline.     Cranial Nerves: No cranial nerve deficit.     Coordination: Coordination normal.  Psychiatric:        Mood and Affect: Mood normal.      LABORATORY DATA:  I have reviewed the data as listed    Latest Ref Rng & Units 03/29/2022    9:47 AM 03/15/2022    8:42 AM 03/01/2022    8:58 AM  CBC  WBC  4.0 - 10.5 K/uL 6.3  5.7  5.9   Hemoglobin 12.0 - 15.0 g/dL 9.4  9.2  8.8   Hematocrit 36.0 - 46.0 % 28.3  27.4  26.7   Platelets 150 - 400 K/uL 256  280  296       Latest Ref Rng & Units 03/29/2022    9:47 AM 03/15/2022    8:42 AM 03/01/2022    8:58 AM  CMP  Glucose 70 - 99 mg/dL 109  99  103   BUN 8 - 23 mg/dL '18  15  13   '$ Creatinine 0.44 - 1.00 mg/dL 1.25  1.14  1.30   Sodium 135 - 145 mmol/L 132  132  131   Potassium 3.5 - 5.1 mmol/L 4.2  4.2  4.1   Chloride 98 - 111 mmol/L 101  100  99   CO2 22 - 32 mmol/L '23  23  22   '$ Calcium 8.9 - 10.3 mg/dL 8.8  9.2  8.8   Total Protein 6.5 - 8.1 g/dL 6.8  6.6  6.6   Total Bilirubin 0.3 - 1.2 mg/dL 0.2  <0.1  0.4   Alkaline Phos 38 - 126 U/L 66  74  70   AST 15 - 41 U/L '21  17  18   '$ ALT 0 - 44 U/L '12  9  8      '$ Iron/TIBC/Ferritin/ %Sat    Component Value Date/Time   IRON 64 03/01/2022 1007   TIBC 237 (L) 03/01/2022 1007   FERRITIN 90 03/01/2022 1007   IRONPCTSAT 27 03/01/2022 1007      RADIOGRAPHIC STUDIES: I have personally reviewed the radiological images as listed and agreed with the findings in the report. CT ABDOMEN PELVIS W CONTRAST  Result Date: 02/12/2022 CLINICAL DATA:  Abdominal pain, acute, nonlocalized EXAM: CT ABDOMEN AND PELVIS WITH CONTRAST TECHNIQUE: Multidetector CT imaging of the abdomen and pelvis was performed using the standard protocol following bolus administration of intravenous contrast. RADIATION DOSE REDUCTION: This exam was performed according to the departmental dose-optimization program which includes automated exposure control, adjustment of the mA and/or kV according to patient size and/or use of iterative reconstruction technique. CONTRAST:  29m OMNIPAQUE IOHEXOL 300 MG/ML  SOLN COMPARISON:  10/25/2021 FINDINGS: Lower chest: No acute abnormality Hepatobiliary: No focal hepatic abnormality. Gallbladder unremarkable. Pancreas: No focal abnormality or ductal dilatation. Spleen: No focal abnormality.  Normal  size. Adrenals/Urinary Tract: No adrenal abnormality. No focal renal abnormality. No stones or hydronephrosis. Urinary bladder is unremarkable. Stomach/Bowel: Large stool burden throughout the colon suggesting constipation. Stomach and small bowel decompressed. Vascular/Lymphatic: Diffuse aortoiliac atherosclerosis. No evidence of aneurysm or adenopathy. Reproductive: Uterus and adnexa unremarkable.  No mass. Other: No free fluid or free air. Musculoskeletal: No acute bony abnormality. IMPRESSION: Large stool burden throughout the colon suggesting constipation. Aortoiliac atherosclerosis. No acute findings. Electronically Signed   By: KRolm BaptiseM.D.   On: 02/12/2022 18:06   CT Chest W Contrast  Result Date: 02/01/2022 CLINICAL DATA:  77year old female presents for follow-up of lung cancer. * Tracking Code: BO * EXAM: CT CHEST WITH CONTRAST TECHNIQUE: Multidetector CT imaging of the chest was performed during intravenous contrast administration. RADIATION DOSE REDUCTION: This exam was performed according to the departmental dose-optimization program which includes automated exposure control, adjustment of the mA and/or kV according to patient size and/or use of iterative reconstruction technique. CONTRAST:  69mOMNIPAQUE IOHEXOL 300 MG/ML  SOLN COMPARISON:  October 25, 2021 FINDINGS: Cardiovascular: Calcified and noncalcified aortic atherosclerotic plaque. Normal heart size. Three-vessel coronary artery calcification as on previous imaging quite extensive. LEFT-sided IJ Port-A-Cath terminates in the mid RIGHT atrium. Central pulmonary vasculature is unremarkable on venous phase. Mediastinum/Nodes: Mild generalized thickening of the esophagus is stable and likely post treatment related. RIGHT hilar lymph node (image 65/2) 8 mm, unchanged. No new signs of adenopathy in the chest. Lungs/Pleura: Pulmonary emphysema as on previous imaging. Material in the dependent aspect of the bronchus intermedius in the RIGHT  chest. Bandlike consolidative changes extend from the RIGHT hilum into the peripheral RIGHT chest. Associated septal thickening and bandlike changes are more pronounced than on previous imaging. Nodular area amidst bandlike changes at the site of previous nodule measuring 7 mm greatest craniocaudal extent previously greatest area of more bandlike and non nodular change at 5 mm greatest thickness, another area on image 78/5 measuring 7 mm as well. More peripheral area of nodularity (image 67/5 appears bandlike with some thickening medially. LEFT lower lobe pulmonary nodule stable at 4 mm (image 101/3) New small irregular nodules in the RIGHT lower lobe (image 91/3 and image 88/3 measuring 3-4 mm with some indistinct adjacent septal thickening. Mild apical septal thickening generalized throughout the  LEFT and RIGHT upper lobes greatest on the RIGHT similar to previous imaging. Upper Abdomen: Incidental imaging of upper abdominal contents without acute process. Musculoskeletal: No chest wall mass.  No acute bone finding. IMPRESSION: 1. Nodular areas developing of bandlike post treatment changes are of uncertain significance. While potentially evolving post treatment changes increasingly nodular foci amidst expected consolidative changes a mildly suspicious. PET imaging or continued short interval follow-up could be considered. 2. New small irregular nodules in the RIGHT lower lobe measuring 3-4 mm with some indistinct adjacent septal thickening. These are nonspecific and could be infectious or inflammatory. Particularly given the presence of material in the RIGHT lower lobe bronchus. 3. Stable 4 mm LEFT lower lobe pulmonary nodule. 4. Three-vessel coronary artery calcification as on previous imaging quite extensive. 5. Emphysema and aortic atherosclerosis. Aortic Atherosclerosis (ICD10-I70.0) and Emphysema (ICD10-J43.9). Electronically Signed   By: Zetta Bills M.D.   On: 02/01/2022 14:57

## 2022-04-03 ENCOUNTER — Ambulatory Visit (INDEPENDENT_AMBULATORY_CARE_PROVIDER_SITE_OTHER): Payer: Medicare PPO | Admitting: Family Medicine

## 2022-04-03 ENCOUNTER — Telehealth: Payer: Self-pay

## 2022-04-03 ENCOUNTER — Encounter: Payer: Self-pay | Admitting: Family Medicine

## 2022-04-03 VITALS — BP 77/47 | HR 94 | Temp 97.8°F | Ht 68.0 in | Wt 125.5 lb

## 2022-04-03 DIAGNOSIS — Z0000 Encounter for general adult medical examination without abnormal findings: Secondary | ICD-10-CM

## 2022-04-03 DIAGNOSIS — I700 Atherosclerosis of aorta: Secondary | ICD-10-CM

## 2022-04-03 DIAGNOSIS — E782 Mixed hyperlipidemia: Secondary | ICD-10-CM | POA: Diagnosis not present

## 2022-04-03 DIAGNOSIS — M816 Localized osteoporosis [Lequesne]: Secondary | ICD-10-CM

## 2022-04-03 DIAGNOSIS — C539 Malignant neoplasm of cervix uteri, unspecified: Secondary | ICD-10-CM

## 2022-04-03 DIAGNOSIS — I10 Essential (primary) hypertension: Secondary | ICD-10-CM | POA: Diagnosis not present

## 2022-04-03 DIAGNOSIS — N1831 Chronic kidney disease, stage 3a: Secondary | ICD-10-CM | POA: Diagnosis not present

## 2022-04-03 DIAGNOSIS — C3491 Malignant neoplasm of unspecified part of right bronchus or lung: Secondary | ICD-10-CM | POA: Diagnosis not present

## 2022-04-03 DIAGNOSIS — R8281 Pyuria: Secondary | ICD-10-CM | POA: Diagnosis not present

## 2022-04-03 DIAGNOSIS — Z1231 Encounter for screening mammogram for malignant neoplasm of breast: Secondary | ICD-10-CM

## 2022-04-03 DIAGNOSIS — J449 Chronic obstructive pulmonary disease, unspecified: Secondary | ICD-10-CM

## 2022-04-03 DIAGNOSIS — E034 Atrophy of thyroid (acquired): Secondary | ICD-10-CM

## 2022-04-03 DIAGNOSIS — Z7189 Other specified counseling: Secondary | ICD-10-CM

## 2022-04-03 LAB — URINALYSIS, ROUTINE W REFLEX MICROSCOPIC
Bilirubin, UA: NEGATIVE
Glucose, UA: NEGATIVE
Ketones, UA: NEGATIVE
Nitrite, UA: NEGATIVE
Protein,UA: NEGATIVE
Specific Gravity, UA: <1.005 — ABNORMAL LOW (ref 1.005–1.030)
Urobilinogen, Ur: 0.2 mg/dL (ref 0.2–1.0)
pH, UA: 5 (ref 5.0–7.5)

## 2022-04-03 LAB — MICROSCOPIC EXAMINATION: Bacteria, UA: NONE SEEN

## 2022-04-03 LAB — MICROALBUMIN, URINE WAIVED
Creatinine, Urine Waived: 10 mg/dL (ref 10–300)
Microalb, Ur Waived: 30 mg/L — ABNORMAL HIGH (ref 0–19)

## 2022-04-03 MED ORDER — CLOPIDOGREL BISULFATE 75 MG PO TABS: 75.0000 mg | ORAL_TABLET | Freq: Every day | ORAL | 1 refills | Status: DC

## 2022-04-03 MED ORDER — OMEPRAZOLE 20 MG PO CPDR: 20.0000 mg | DELAYED_RELEASE_CAPSULE | Freq: Every day | ORAL | 3 refills | Status: DC

## 2022-04-03 MED ORDER — GABAPENTIN 100 MG PO CAPS: 100.0000 mg | ORAL_CAPSULE | Freq: Three times a day (TID) | ORAL | 1 refills | Status: DC

## 2022-04-03 MED ORDER — ONDANSETRON HCL 8 MG PO TABS: 8.0000 mg | ORAL_TABLET | Freq: Three times a day (TID) | ORAL | 1 refills | Status: DC | PRN

## 2022-04-03 MED ORDER — ALBUTEROL SULFATE HFA 108 (90 BASE) MCG/ACT IN AERS: 2.0000 | INHALATION_SPRAY | Freq: Four times a day (QID) | RESPIRATORY_TRACT | 6 refills | Status: DC | PRN

## 2022-04-03 MED ORDER — CYCLOBENZAPRINE HCL 10 MG PO TABS: 10.0000 mg | ORAL_TABLET | Freq: Every day | ORAL | 1 refills | Status: DC

## 2022-04-03 MED ORDER — TRELEGY ELLIPTA 100-62.5-25 MCG/ACT IN AEPB: 1.0000 | INHALATION_SPRAY | Freq: Every day | RESPIRATORY_TRACT | 11 refills | Status: DC

## 2022-04-03 NOTE — Assessment & Plan Note (Signed)
A voluntary discussion about advance care planning including the explanation and discussion of advance directives was extensively discussed  with the patient for 3 minutes with patient present.  Explanation about the health care proxy and Living will was reviewed and packet with forms with explanation of how to fill them out was given.  During this discussion, the patient was not able to identify a health care proxy and plans to fill out the paperwork required.  Patient was offered a separate [facility] visit for further assistance with forms.

## 2022-04-03 NOTE — Assessment & Plan Note (Signed)
Will keep BP and cholesterol under good control. Continue to monitor. Call with any concerns.

## 2022-04-03 NOTE — Assessment & Plan Note (Signed)
Rechecking labs today. Await results. Treat as needed. Call with any concerns.

## 2022-04-03 NOTE — Assessment & Plan Note (Signed)
Continue to follow with oncology. Continue to monitor. Call with any concerns.

## 2022-04-03 NOTE — Assessment & Plan Note (Signed)
Under good control on current regimen. Continue current regimen. Continue to monitor. Call with any concerns. Refills given. Labs drawn today.   

## 2022-04-03 NOTE — Assessment & Plan Note (Signed)
Due for recheck on her DEXA. Await results. Treat as needed.

## 2022-04-03 NOTE — Assessment & Plan Note (Signed)
BP running very low. Stop lisinopril. Recheck 2-3 weeks.

## 2022-04-03 NOTE — Assessment & Plan Note (Signed)
Rechecking labs today. Await results. Treat as needed.  °

## 2022-04-03 NOTE — Patient Instructions (Signed)
Preventative Services:  Health Risk Assessment and Personalized Prevention Plan: Done today Bone Mass Measurements: ordered today Breast Cancer Screening: ordered today CVD Screening: Done today Cervical Cancer Screening: N/A Colon Cancer Screening: Would like to hold off for right now Depression Screening: Done today Diabetes Screening: done today Glaucoma Screening: see your eye doctor Hepatitis B vaccine: N/A Hepatitis C screening: Up to date HIV Screening: up to date Flu Vaccine: up to date Lung cancer Screening: up to date Obesity Screening: done today Pneumonia Vaccines (2): up to date STI Screening: N/A

## 2022-04-03 NOTE — Assessment & Plan Note (Signed)
Continue to follow with oncology. Call with any concerns. Continue to monitor.

## 2022-04-03 NOTE — Telephone Encounter (Signed)
Called patient and unable to leave voice message to inform her that she is scheduled for her DEXA Scan and Mammogram on 04/13/22 at 1:20 PM.   OK for [facility] to give note if patient calls back.

## 2022-04-03 NOTE — Telephone Encounter (Signed)
-----   Message from [facility], [place] sent at 04/03/2022  2:10 PM EST ----- Please schedule dexa and mammo so we can tell her when she comes back in in 2 weeks.

## 2022-04-03 NOTE — Progress Notes (Signed)
BP (!) 77/47 (BP Location: Left Arm, Cuff Size: Normal)   Pulse 94   Temp 97.8 F (36.6 C) (Oral)   Ht '5\' 8"'$  (1.727 m)   Wt 125 lb 8 oz (56.9 kg)   BMI 19.08 kg/m    Subjective:    Patient ID: Mary Hoover, female    DOB: [birth year], 77 y.o.   MRN: [id number]  HPI: Mary Hoover is a 77 y.o. female presenting on 04/03/2022 for comprehensive medical examination. Current medical complaints include:  HYPERTENSION / HYPERLIPIDEMIA Satisfied with current treatment? yes Duration of hypertension: chronic BP monitoring frequency: not checking BP medication side effects: no Past BP meds: lisinopril Duration of hyperlipidemia: chronic Cholesterol medication side effects: N/A Cholesterol supplements: none Past cholesterol medications: None Medication compliance: excellent compliance Aspirin: no Recent stressors: no Recurrent headaches: no Visual changes: no Palpitations: no Dyspnea: no Chest pain: no Lower extremity edema: no Dizzy/lightheaded: yes  HYPOTHYROIDISM Thyroid control status:controlled Satisfied with current treatment? yes Medication side effects: no Medication compliance: excellent compliance Etiology of hypothyroidism:  Recent dose adjustment:no Fatigue: yes Cold intolerance: no Heat intolerance: no Weight gain: no Weight loss: no Constipation: yes Diarrhea/loose stools: yes Palpitations: no Lower extremity edema: no Anxiety/depressed mood: no  COPD COPD status: controlled Satisfied with current treatment?: yes Oxygen use: no Dyspnea frequency:  Cough frequency:  Rescue inhaler frequency:   Limitation of activity: yes Productive cough:  Last Spirometry:  Pneumovax: Up to Date Influenza: Up to Date  Menopausal Symptoms: no  Functional Status Survey: Is the patient deaf or have difficulty hearing?: Yes Does the patient have difficulty seeing, even when wearing glasses/contacts?: No Does the patient have difficulty concentrating,  remembering, or making decisions?: Yes Does the patient have difficulty walking or climbing stairs?: Yes Does the patient have difficulty dressing or bathing?: No Does the patient have difficulty doing errands alone such as visiting a doctor's office or shopping?: No     04/03/2022    1:55 PM 02/20/2022    2:58 PM 11/04/2021    3:08 PM 03/15/2021    9:08 AM 05/20/2020    1:26 PM  [place] in the past year? 0 0 0 0 0  Number falls in past yr: 0 0 0 0 0  Injury with Fall? 0 0 0 0 0  Risk for fall due to :  No Fall Risks No Fall Risks  Impaired balance/gait  Follow up Falls evaluation completed Falls evaluation completed Falls evaluation completed Falls evaluation completed;Education provided;Falls prevention discussed Falls evaluation completed    Depression Screen    04/03/2022    1:56 PM 02/20/2022    2:59 PM 11/04/2021    3:08 PM 03/15/2021    9:11 AM 01/04/2021    2:12 PM  Depression screen PHQ 2/9  Decreased Interest 0 0 0 1 0  Down, Depressed, Hopeless 0 1 0 1 0  PHQ - 2 Score 0 1 0 2 0  Altered sleeping 0 0 0 0 0  Tired, decreased energy 1 0 0 0 1  Change in appetite 2 2 0 0 0  Feeling bad or failure about yourself  0 2 0 0 0  Trouble concentrating 0 0 0 0 0  Moving slowly or fidgety/restless 0 0 0 0 0  Suicidal thoughts 0 0 0  0  PHQ-9 Score 3 5 0 2 1  Difficult doing work/chores Somewhat difficult Somewhat difficult Not difficult at all       Advanced  Directives Does patient have a HCPOA?    no If yes, name and contact information:  Does patient have a living will or MOST form?  no  Past Medical History:  Past Medical History:  Diagnosis Date   Arthritis    Cancer (Mary Hoover)    Carotid atherosclerosis    Carotid bruit    Cervical cancer (Mary Hoover)    COPD (chronic obstructive Mary Hoover disease) (Mary Hoover)    emphysema   Coronary artery disease    Family history of adverse reaction to anesthesia    paternal grandfather died during surgery-pt unaware what happened    GERD (gastroesophageal reflux disease)    Hyperlipidemia    Hypertension    Hyponatremia 02/27/2019   Hypothyroidism    Infected cat bite 1980s   was hospitalized   Irregular heartbeat    Medical history non-contributory    Peripheral vascular disease (Mary Hoover)    Renal insufficiency    Tobacco use     Surgical History:  Past Surgical History:  Procedure Laterality Date   CAROTID ENDARTERECTOMY Right Oct. 2015   Dr. Lucky Hoover   CAROTID STENOSIS Left April 2016   carotid stenosis surgery   COLONOSCOPY WITH PROPOFOL N/A 11/08/2016   Procedure: COLONOSCOPY WITH PROPOFOL;  Surgeon: Mary Bellows, MD;  [facility];  Service: Mary [facility];  Laterality: N/A;   ESOPHAGOGASTRODUODENOSCOPY (EGD) WITH PROPOFOL N/A 11/08/2016   Procedure: ESOPHAGOGASTRODUODENOSCOPY (EGD) WITH PROPOFOL;  Surgeon: Mary Bellows, MD;  [facility];  Service: Mary Hoover;  Laterality: N/A;   FLEXIBLE BRONCHOSCOPY N/A 12/17/2014   Procedure: FLEXIBLE BRONCHOSCOPY;  Surgeon: Mary Mcardle, MD;  Location: [facility];  Service: Mary Hoover;  Laterality: N/A;   INCISION AND DRAINAGE / EXCISION THYROGLOSSAL CYST  April 2011   PORTA CATH INSERTION N/A 03/03/2021   Procedure: PORTA CATH INSERTION;  Surgeon: Mary Huxley, MD;  Location: [facility];  Service: Cardiovascular;  Laterality: N/A;   VIDEO BRONCHOSCOPY WITH ENDOBRONCHIAL ULTRASOUND N/A 01/12/2021   Procedure: VIDEO BRONCHOSCOPY WITH ENDOBRONCHIAL ULTRASOUND;  Surgeon: Mary Pita, MD;  Location: [facility];  Service: Cardiopulmonary;  Laterality: N/A;    Medications:  Current Outpatient Medications on File Prior to Visit  Medication Sig   acetaminophen (TYLENOL) 500 MG tablet Take 500 mg by mouth every 6 (six) hours as needed for moderate pain.   alendronate (FOSAMAX) 70 MG tablet TAKE 1 TABLET EVERY 7 DAYS . TAKE WITH A FULL GLASS OF WATER ON AN EMPTY STOMACH.   chlorhexidine (PERIDEX) 0.12 % solution Use as directed 5 mLs in  the mouth or throat 2 (two) times daily.   docusate sodium (COLACE) 100 MG capsule Take 1 capsule (100 mg total) by mouth daily.   Iron-Vitamin C 65-125 MG TABS Take 1 tablet by mouth daily.   levothyroxine (SYNTHROID) 88 MCG tablet Take 1 tablet (88 mcg total) by mouth daily.   lidocaine (LIDODERM) 5 % Place 1 patch onto the skin daily. Remove & Discard patch within 12 hours or as directed by MD   lidocaine-prilocaine (EMLA) cream Apply 1 application topically as needed.   Multiple Vitamin (MULTIVITAMIN) tablet Take 1 tablet by mouth daily.   prochlorperazine (COMPAZINE) 10 MG tablet Take 1 tablet (10 mg total) by mouth every 6 (six) hours as needed for nausea or vomiting.   traMADol (ULTRAM) 50 MG tablet Take 1-2 tablets (50-100 mg total) by mouth every 12 (twelve) hours as needed.   vitamin B-12 (CYANOCOBALAMIN) 500 MCG tablet Take 500 mcg by mouth daily.  No current facility-administered medications on file prior to visit.    Allergies:  Allergies  Allergen Reactions   Atorvastatin Other (See Comments)    dizziness    Social History:  Social History   Socioeconomic History   Marital status: Married    Spouse name: Deidre Ala   Number of children: Not on file   Years of education: Not on file   Highest education level: 9th grade  Occupational History   Occupation: retired   Tobacco Use   Smoking status: Every Day    Packs/day: 0.50    Years: 50.00    Total pack years: 25.00    Types: Cigarettes   Smokeless tobacco: Never   Tobacco comments:    0.5 PPD 02/10/2021  Vaping Use   Vaping Use: Former  Substance and Sexual Activity   Alcohol use: No    Alcohol/week: 0.0 standard drinks of alcohol   Drug use: No   Sexual activity: Yes  Other Topics Concern   Not on file  Social History Narrative   Lives at home with husband   Social Determinants of Health   Financial Resource Strain: Low Risk  (03/15/2021)   Overall Financial Resource Strain (CARDIA)    Difficulty of  Paying Living Expenses: Not hard at all  Food Insecurity: No Food Insecurity (03/15/2021)   Hunger Vital Sign    Worried About Running Out of Food in the Last Year: Never true    [place] in the Last Year: Never true  Transportation Needs: No Transportation Needs (03/15/2021)   PRAPARE - Hydrologist (Medical): No    Lack of Transportation (Non-Medical): No  Physical Activity: Inactive (03/15/2021)   Exercise Vital Sign    Days of Exercise per Week: 0 days    Minutes of Exercise per Session: 0 min  Stress: No Stress Concern Present (03/15/2021)   [place]    Feeling of Stress : Not at all  Social Connections: Moderately Isolated (03/15/2021)   Social Connection and Isolation Panel [NHANES]    Frequency of Communication with Friends and Family: More than three times a week    Frequency of Social Gatherings with Friends and Family: More than three times a week    Attends Religious Services: Never    Marine scientist or Organizations: No    Attends Archivist Meetings: Never    Marital Status: Married  Human resources officer Violence: Not At Risk (03/15/2021)   Humiliation, Afraid, Rape, and Kick questionnaire    Fear of Current or Ex-Partner: No    Emotionally Abused: No    Physically Abused: No    Sexually Abused: No   Social History   Tobacco Use  Smoking Status Every Day   Packs/day: 0.50   Years: 50.00   Total pack years: 25.00   Types: Cigarettes  Smokeless Tobacco Never  Tobacco Comments   0.5 PPD 02/10/2021   Social History   Substance and Sexual Activity  Alcohol Use No   Alcohol/week: 0.0 standard drinks of alcohol    Family History:  Family History  Problem Relation Age of Onset   Congestive Heart Failure Mother    Heart disease Mother    Hypertension Mother    Hypertension Sister    Diabetes Sister    Heart disease Sister    Heart disease  Maternal Uncle    Heart disease Maternal Grandmother    Stroke Maternal  Grandfather    Cancer Brother        liver, lung   Heart disease Brother    Hypertension Brother    Heart attack Brother    COPD Neg Hx    Breast cancer Neg Hx     Past medical history, surgical history, medications, allergies, family history and social history reviewed with patient today and changes made to appropriate areas of the chart.   Review of Systems  Constitutional: Negative.   HENT: Negative.    Eyes: Negative.   Respiratory:  Positive for cough. Negative for hemoptysis, sputum production, shortness of breath and wheezing.   Cardiovascular: Negative.   Gastrointestinal:  Positive for constipation and diarrhea. Negative for abdominal pain, blood in stool, heartburn, melena, nausea and vomiting.  Genitourinary: Negative.   Musculoskeletal:  Positive for back pain. Negative for falls, joint pain, myalgias and neck pain.  Skin: Negative.   Neurological:  Positive for dizziness. Negative for tingling, tremors, sensory change, speech change, focal weakness, seizures, loss of consciousness, weakness and headaches.  Endo/Heme/Allergies:  Positive for environmental allergies. Negative for polydipsia. Does not bruise/bleed easily.  Psychiatric/Behavioral: Negative.      All other ROS negative except what is listed above and in the HPI.      Objective:    BP (!) 77/47 (BP Location: Left Arm, Cuff Size: Normal)   Pulse 94   Temp 97.8 F (36.6 C) (Oral)   Ht '5\' 8"'$  (1.727 m)   Wt 125 lb 8 oz (56.9 kg)   BMI 19.08 kg/m   Wt Readings from Last 3 Encounters:  04/03/22 125 lb 8 oz (56.9 kg)  03/29/22 128 lb 8 oz (58.3 kg)  03/15/22 127 lb 14.4 oz (58 kg)    No results found.  Physical Exam Vitals and nursing note reviewed.  Constitutional:      General: She is not in acute distress.    Appearance: Normal appearance. She is normal weight. She is not ill-appearing, toxic-appearing or diaphoretic.   HENT:     Head: Normocephalic and atraumatic.     Right Ear: Tympanic membrane, ear canal and external ear normal. There is no impacted cerumen.     Left Ear: Tympanic membrane, ear canal and external ear normal. There is no impacted cerumen.     Nose: Nose normal. No congestion or rhinorrhea.     Mouth/Throat:     Mouth: Mucous membranes are moist.     Pharynx: Oropharynx is clear. No oropharyngeal exudate or posterior oropharyngeal erythema.  Eyes:     General: No scleral icterus.       Right eye: No discharge.        Left eye: No discharge.     Extraocular Movements: Extraocular movements intact.     Conjunctiva/sclera: Conjunctivae normal.     Pupils: Pupils are equal, round, and reactive to light.  Neck:     Vascular: No carotid bruit.  Cardiovascular:     Rate and Rhythm: Normal rate and regular rhythm.     Pulses: Normal pulses.     Heart sounds: No murmur heard.    No friction rub. No gallop.  Mary [facility]:     Effort: Mary Hoover effort is normal. No respiratory distress.     Breath sounds: Normal breath sounds. No stridor. No wheezing, rhonchi or rales.  Chest:     Chest wall: No tenderness.  Abdominal:     General: Abdomen is flat. Bowel sounds are normal. There is no distension.  Palpations: Abdomen is soft. There is no mass.     Tenderness: There is no abdominal tenderness. There is no right CVA tenderness, left CVA tenderness, guarding or rebound.     Hernia: No hernia is present.  Genitourinary:    Comments: Breast and pelvic exams deferred with shared decision making Musculoskeletal:        General: No swelling, tenderness, deformity or signs of injury.     Cervical back: Normal range of motion and neck supple. No rigidity. No muscular tenderness.     Right lower leg: No edema.     Left lower leg: No edema.  Lymphadenopathy:     Cervical: No cervical adenopathy.  Skin:    General: Skin is warm and dry.     Capillary Refill: Capillary refill takes less than 2  seconds.     Coloration: Skin is not jaundiced or pale.     Findings: No bruising, erythema, lesion or rash.  Neurological:     General: No focal deficit present.     Mental Status: She is alert and oriented to person, place, and time. Mental status is at baseline.     Cranial Nerves: No cranial nerve deficit.     Sensory: No sensory deficit.     Motor: No weakness.     Coordination: Coordination normal.     Gait: Gait normal.     Deep Tendon Reflexes: Reflexes normal.  Psychiatric:        Mood and Affect: Mood normal.        Behavior: Behavior normal.        Thought Content: Thought content normal.        Judgment: Judgment normal.        04/03/2022    1:57 PM 04/25/2018    1:17 PM 04/09/2017    1:44 PM 01/11/2016    2:30 PM  6CIT Screen  What Year? 0 points 0 points 0 points 0 points  What month? 3 points 0 points 0 points 0 points  What time? 0 points 0 points 0 points 0 points  Count back from 20 0 points 0 points 0 points 0 points  Months in reverse 2 points 0 points 0 points 2 points  Repeat phrase 2 points 0 points 0 points 2 points  Total Score 7 points 0 points 0 points 4 points    Results for orders placed or performed in visit on 03/29/22  Comprehensive metabolic panel  Result Value Ref Range   Sodium 132 (L) 135 - 145 mmol/L   Potassium 4.2 3.5 - 5.1 mmol/L   Chloride 101 98 - 111 mmol/L   CO2 23 22 - 32 mmol/L   Glucose, Bld 109 (H) 70 - 99 mg/dL   BUN 18 8 - 23 mg/dL   Creatinine, Ser 1.25 (H) 0.44 - 1.00 mg/dL   Calcium 8.8 (L) 8.9 - 10.3 mg/dL   Total Protein 6.8 6.5 - 8.1 g/dL   Albumin 3.4 (L) 3.5 - 5.0 g/dL   AST 21 15 - 41 U/L   ALT 12 0 - 44 U/L   Alkaline Phosphatase 66 38 - 126 U/L   Total Bilirubin 0.2 (L) 0.3 - 1.2 mg/dL   GFR, Estimated 45 (L) >60 mL/min   Anion gap 8 5 - 15  CBC with Differential  Result Value Ref Range   WBC 6.3 4.0 - 10.5 K/uL   RBC 2.93 (L) 3.87 - 5.11 MIL/uL   Hemoglobin 9.4 (L) 12.0 - 15.0 g/dL  HCT 28.3 (L)  36.0 - 46.0 %   MCV 96.6 80.0 - 100.0 fL   MCH 32.1 26.0 - 34.0 pg   MCHC 33.2 30.0 - 36.0 g/dL   RDW 14.6 11.5 - 15.5 %   Platelets 256 150 - 400 K/uL   nRBC 0.0 0.0 - 0.2 %   Neutrophils Relative % 78 %   Neutro Abs 5.0 1.7 - 7.7 K/uL   Lymphocytes Relative 10 %   Lymphs Abs 0.7 0.7 - 4.0 K/uL   Monocytes Relative 8 %   Monocytes Absolute 0.5 0.1 - 1.0 K/uL   Eosinophils Relative 2 %   Eosinophils Absolute 0.1 0.0 - 0.5 K/uL   Basophils Relative 1 %   Basophils Absolute 0.0 0.0 - 0.1 K/uL   Immature Granulocytes 1 %   Abs Immature Granulocytes 0.03 0.00 - 0.07 K/uL      Assessment & Plan:   Problem List Items Addressed This Visit       Cardiovascular and Mediastinum   Essential hypertension    BP running very low. Stop lisinopril. Recheck 2-3 weeks.       Relevant Orders   CBC with Differential/Platelet   Comprehensive metabolic panel   Microalbumin, Urine Waived   Aortic atherosclerosis (Mary Hoover)    Will keep BP and cholesterol under good control. Continue to monitor. Call with any concerns.       Relevant Orders   CBC with Differential/Platelet   Comprehensive metabolic panel   Lipid Panel w/o Chol/HDL Ratio     Respiratory   Primary lung adenocarcinoma ([place]) (Chronic)    Continue to follow with oncology. Continue to monitor. Call with any concerns.       Relevant Medications   ondansetron (ZOFRAN) 8 MG tablet   COPD (chronic obstructive Mary Hoover disease) (Mary Hoover)    Under good control on current regimen. Continue current regimen. Continue to monitor. Call with any concerns. Refills given. Labs drawn today.        Relevant Medications   albuterol (VENTOLIN HFA) 108 (90 Base) MCG/ACT inhaler   Fluticasone-Umeclidin-Vilant (TRELEGY ELLIPTA) 100-62.5-25 MCG/ACT AEPB   Other Relevant Orders   CBC with Differential/Platelet   Comprehensive metabolic panel     Endocrine   Hypothyroidism    Rechecking labs today. Await results. Treat as needed. Call with any  concerns.       Relevant Orders   CBC with Differential/Platelet   Comprehensive metabolic panel   TSH     Musculoskeletal and Integument   Localized osteoporosis without current pathological fracture    Due for recheck on her DEXA. Await results. Treat as needed.       Relevant Orders   DG Bone Density     Genitourinary   Cervical cancer ([place]) (Chronic)    Continue to follow with oncology. Call with any concerns. Continue to monitor.       Relevant Medications   ondansetron (ZOFRAN) 8 MG tablet   CKD (chronic kidney disease) stage 3, GFR 30-59 ml/min (Mary Hoover) (Chronic)    Rechecking labs today. Await results. Treat as needed.       Relevant Orders   CBC with Differential/Platelet   Comprehensive metabolic panel   Urinalysis, Routine w reflex microscopic   Microalbumin, Urine Waived     Other   Hyperlipidemia    Under good control on current regimen. Continue current regimen. Continue to monitor. Call with any concerns. Refills given. Labs drawn today.        Relevant Orders  CBC with Differential/Platelet   Comprehensive metabolic panel   Lipid Panel w/o Chol/HDL Ratio   Advanced directives, counseling/discussion    A voluntary discussion about advance care planning including the explanation and discussion of advance directives was extensively discussed  with the patient for 3 minutes with patient present.  Explanation about the health care proxy and Living will was reviewed and packet with forms with explanation of how to fill them out was given.  During this discussion, the patient was not able to identify a health care proxy and plans to fill out the paperwork required.  Patient was offered a separate [place] visit for further assistance with forms.         Other Visit Diagnoses     Encounter for Medicare annual wellness exam    -  Primary   Preventative care discussed today as below.   Routine general medical examination at a health care facility        Vaccines up to date. Screening labs checked today. Declines colonoscopy. Mammogram and DEXA ordered today. Continue diet and exercise. Call with any concerns.   Encounter for screening mammogram for malignant neoplasm of breast       Mammogram ordered today.   Relevant Orders   MM 3D SCREEN BREAST BILATERAL   Pyuria       Will await culture. Call with any concerns.   Relevant Orders   Urine Culture        Preventative Services:  Health Risk Assessment and Personalized Prevention Plan: Done today Bone Mass Measurements: ordered today Breast Cancer Screening: ordered today CVD Screening: Done today Cervical Cancer Screening: N/A Colon Cancer Screening: Would like to hold off for right now Depression Screening: Done today Diabetes Screening: done today Glaucoma Screening: see your eye doctor Hepatitis B vaccine: N/A Hepatitis C screening: Up to date HIV Screening: up to date Flu Vaccine: up to date Lung cancer Screening: up to date Obesity Screening: done today Pneumonia Vaccines (2): up to date STI Screening: N/A  Follow up plan: Return 2-3 weeks follow up BP.   LABORATORY TESTING:  - Pap smear: not applicable  IMMUNIZATIONS:   - Tdap: Tetanus vaccination status reviewed: last tetanus booster within 10 years. - Influenza: Up to date - Pneumovax: Up to date - Prevnar: Up to date - Zostavax vaccine: Refused  SCREENING: -Mammogram: Ordered today  - Colonoscopy: Not applicable  - Bone Density: Ordered today    PATIENT COUNSELING:   Advised to take 1 mg of folate supplement per day if capable of pregnancy.   Sexuality: Discussed sexually transmitted diseases, partner selection, use of condoms, avoidance of unintended pregnancy  and contraceptive alternatives.   Advised to avoid cigarette smoking.  I discussed with the patient that most people either abstain from alcohol or drink within safe limits (<=14/week and <=4 drinks/occasion for males, <=7/weeks and <=  3 drinks/occasion for females) and that the risk for alcohol disorders and other health effects rises proportionally with the number of drinks per week and how often a drinker exceeds daily limits.  Discussed cessation/primary prevention of drug use and availability of treatment for abuse.   Diet: Encouraged to adjust caloric intake to maintain  or achieve ideal body weight, to reduce intake of dietary saturated fat and total fat, to limit sodium intake by avoiding high sodium foods and not adding table salt, and to maintain adequate dietary potassium and calcium preferably from fresh fruits, vegetables, and low-fat dairy products.    stressed  the importance of regular exercise  Injury prevention: Discussed safety belts, safety helmets, smoke detector, smoking near bedding or upholstery.   Dental health: Discussed importance of regular tooth brushing, flossing, and dental visits.    NEXT PREVENTATIVE PHYSICAL DUE IN 1 YEAR. Return 2-3 weeks follow up BP.

## 2022-04-04 ENCOUNTER — Encounter: Payer: Self-pay | Admitting: Family Medicine

## 2022-04-04 ENCOUNTER — Encounter: Payer: Self-pay | Admitting: Oncology

## 2022-04-04 ENCOUNTER — Other Ambulatory Visit: Payer: Self-pay | Admitting: Family Medicine

## 2022-04-04 LAB — CBC WITH DIFFERENTIAL/PLATELET
Basophils Absolute: 0 10*3/uL (ref 0.0–0.2)
Basos: 1 %
EOS (ABSOLUTE): 0.1 10*3/uL (ref 0.0–0.4)
Eos: 1 %
Hematocrit: 32.2 % — ABNORMAL LOW (ref 34.0–46.6)
Hemoglobin: 11 g/dL — ABNORMAL LOW (ref 11.1–15.9)
Immature Grans (Abs): 0 10*3/uL (ref 0.0–0.1)
Immature Granulocytes: 0 %
Lymphocytes Absolute: 0.5 10*3/uL — ABNORMAL LOW (ref 0.7–3.1)
Lymphs: 9 %
MCH: 32.6 pg (ref 26.6–33.0)
MCHC: 34.2 g/dL (ref 31.5–35.7)
MCV: 96 fL (ref 79–97)
Monocytes Absolute: 0.4 10*3/uL (ref 0.1–0.9)
Monocytes: 6 %
Neutrophils Absolute: 5.2 10*3/uL (ref 1.4–7.0)
Neutrophils: 83 %
Platelets: 307 10*3/uL (ref 150–450)
RBC: 3.37 x10E6/uL — ABNORMAL LOW (ref 3.77–5.28)
RDW: 14.3 % (ref 11.7–15.4)
WBC: 6.3 10*3/uL (ref 3.4–10.8)

## 2022-04-04 LAB — COMPREHENSIVE METABOLIC PANEL
ALT: 11 IU/L (ref 0–32)
AST: 15 IU/L (ref 0–40)
Albumin/Globulin Ratio: 1.5 (ref 1.2–2.2)
Albumin: 4.1 g/dL (ref 3.8–4.8)
Alkaline Phosphatase: 89 IU/L (ref 44–121)
BUN/Creatinine Ratio: 11 — ABNORMAL LOW (ref 12–28)
BUN: 15 mg/dL (ref 8–27)
Bilirubin Total: <0.2 mg/dL (ref 0.0–1.2)
CO2: 20 mmol/L (ref 20–29)
Calcium: 9.9 mg/dL (ref 8.7–10.3)
Chloride: 99 mmol/L (ref 96–106)
Creatinine, Ser: 1.35 mg/dL — ABNORMAL HIGH (ref 0.57–1.00)
Globulin, Total: 2.8 g/dL (ref 1.5–4.5)
Glucose: 77 mg/dL (ref 70–99)
Potassium: 4.8 mmol/L (ref 3.5–5.2)
Sodium: 136 mmol/L (ref 134–144)
Total Protein: 6.9 g/dL (ref 6.0–8.5)
eGFR: 41 mL/min/{1.73_m2} — ABNORMAL LOW (ref >59)

## 2022-04-04 LAB — LIPID PANEL W/O CHOL/HDL RATIO
Cholesterol, Total: 187 mg/dL (ref 100–199)
HDL: 56 mg/dL (ref >39)
LDL Chol Calc (NIH): 108 mg/dL — ABNORMAL HIGH (ref 0–99)
Triglycerides: 130 mg/dL (ref 0–149)
VLDL Cholesterol Cal: 23 mg/dL (ref 5–40)

## 2022-04-04 LAB — TSH: TSH: 0.743 u[IU]/mL (ref 0.450–4.500)

## 2022-04-04 MED ORDER — LEVOTHYROXINE SODIUM 88 MCG PO TABS: 88.0000 ug | ORAL_TABLET | Freq: Every day | ORAL | 3 refills | Status: DC

## 2022-04-05 LAB — URINE CULTURE: Organism ID, Bacteria: NO GROWTH

## 2022-04-12 ENCOUNTER — Inpatient Hospital Stay: Payer: Medicare PPO | Attending: Oncology

## 2022-04-12 ENCOUNTER — Encounter: Payer: Self-pay | Admitting: Oncology

## 2022-04-12 ENCOUNTER — Inpatient Hospital Stay: Payer: Medicare PPO

## 2022-04-12 ENCOUNTER — Inpatient Hospital Stay (HOSPITAL_BASED_OUTPATIENT_CLINIC_OR_DEPARTMENT_OTHER): Payer: Medicare PPO | Admitting: Oncology

## 2022-04-12 VITALS — BP 146/66 | HR 88 | Temp 97.6°F | Resp 18 | Wt 127.2 lb

## 2022-04-12 DIAGNOSIS — C3491 Malignant neoplasm of unspecified part of right bronchus or lung: Secondary | ICD-10-CM

## 2022-04-12 DIAGNOSIS — Z79899 Other long term (current) drug therapy: Secondary | ICD-10-CM | POA: Insufficient documentation

## 2022-04-12 DIAGNOSIS — R110 Nausea: Secondary | ICD-10-CM

## 2022-04-12 DIAGNOSIS — Z5112 Encounter for antineoplastic immunotherapy: Secondary | ICD-10-CM | POA: Diagnosis not present

## 2022-04-12 DIAGNOSIS — C539 Malignant neoplasm of cervix uteri, unspecified: Secondary | ICD-10-CM | POA: Diagnosis not present

## 2022-04-12 DIAGNOSIS — I129 Hypertensive chronic kidney disease with stage 1 through stage 4 chronic kidney disease, or unspecified chronic kidney disease: Secondary | ICD-10-CM | POA: Insufficient documentation

## 2022-04-12 DIAGNOSIS — D631 Anemia in chronic kidney disease: Secondary | ICD-10-CM | POA: Insufficient documentation

## 2022-04-12 DIAGNOSIS — Z720 Tobacco use: Secondary | ICD-10-CM | POA: Diagnosis not present

## 2022-04-12 DIAGNOSIS — N183 Chronic kidney disease, stage 3 unspecified: Secondary | ICD-10-CM | POA: Diagnosis not present

## 2022-04-12 DIAGNOSIS — F17210 Nicotine dependence, cigarettes, uncomplicated: Secondary | ICD-10-CM | POA: Insufficient documentation

## 2022-04-12 LAB — CBC WITH DIFFERENTIAL/PLATELET
Abs Immature Granulocytes: 0.03 10*3/uL (ref 0.00–0.07)
Basophils Absolute: 0 10*3/uL (ref 0.0–0.1)
Basophils Relative: 0 %
Eosinophils Absolute: 0.2 10*3/uL (ref 0.0–0.5)
Eosinophils Relative: 3 %
HCT: 29.2 % — ABNORMAL LOW (ref 36.0–46.0)
Hemoglobin: 9.7 g/dL — ABNORMAL LOW (ref 12.0–15.0)
Immature Granulocytes: 1 %
Lymphocytes Relative: 10 %
Lymphs Abs: 0.6 10*3/uL — ABNORMAL LOW (ref 0.7–4.0)
MCH: 32.1 pg (ref 26.0–34.0)
MCHC: 33.2 g/dL (ref 30.0–36.0)
MCV: 96.7 fL (ref 80.0–100.0)
Monocytes Absolute: 0.5 10*3/uL (ref 0.1–1.0)
Monocytes Relative: 9 %
Neutro Abs: 4.5 10*3/uL (ref 1.7–7.7)
Neutrophils Relative %: 77 %
Platelets: 220 10*3/uL (ref 150–400)
RBC: 3.02 MIL/uL — ABNORMAL LOW (ref 3.87–5.11)
RDW: 14.4 % (ref 11.5–15.5)
WBC: 5.9 10*3/uL (ref 4.0–10.5)
nRBC: 0 % (ref 0.0–0.2)

## 2022-04-12 LAB — COMPREHENSIVE METABOLIC PANEL
ALT: 18 U/L (ref 0–44)
AST: 28 U/L (ref 15–41)
Albumin: 3.6 g/dL (ref 3.5–5.0)
Alkaline Phosphatase: 76 U/L (ref 38–126)
Anion gap: 8 (ref 5–15)
BUN: 18 mg/dL (ref 8–23)
CO2: 22 mmol/L (ref 22–32)
Calcium: 9 mg/dL (ref 8.9–10.3)
Chloride: 102 mmol/L (ref 98–111)
Creatinine, Ser: 1.14 mg/dL — ABNORMAL HIGH (ref 0.44–1.00)
GFR, Estimated: 50 mL/min — ABNORMAL LOW (ref >60)
Glucose, Bld: 119 mg/dL — ABNORMAL HIGH (ref 70–99)
Potassium: 3.9 mmol/L (ref 3.5–5.1)
Sodium: 132 mmol/L — ABNORMAL LOW (ref 135–145)
Total Bilirubin: 0.3 mg/dL (ref 0.3–1.2)
Total Protein: 7 g/dL (ref 6.5–8.1)

## 2022-04-12 LAB — TSH: TSH: 1.559 u[IU]/mL (ref 0.350–4.500)

## 2022-04-12 LAB — T4, FREE: Free T4: 1.05 ng/dL (ref 0.61–1.12)

## 2022-04-12 MED ORDER — SODIUM CHLORIDE 0.9 % IV SOLN
10.0000 mg/kg | Freq: Once | INTRAVENOUS | Status: AC
  Administered 2022-04-12: 620 mg via INTRAVENOUS
  Filled 2022-04-12: qty 10

## 2022-04-12 MED ORDER — SODIUM CHLORIDE 0.9 % IV SOLN
Freq: Once | INTRAVENOUS | Status: AC
  Filled 2022-04-12: qty 250

## 2022-04-12 MED ORDER — SODIUM CHLORIDE 0.9% FLUSH
10.0000 mL | INTRAVENOUS | Status: DC | PRN
  Administered 2022-04-12: 10 mL
  Filled 2022-04-12: qty 10

## 2022-04-12 MED ORDER — HEPARIN SOD (PORK) LOCK FLUSH 100 UNIT/ML IV SOLN
500.0000 [IU] | Freq: Once | INTRAVENOUS | Status: AC | PRN
  Administered 2022-04-12: 500 [IU]
  Filled 2022-04-12: qty 5

## 2022-04-12 MED ORDER — IRON-VITAMIN C 65-125 MG PO TABS: 1.0000 | ORAL_TABLET | Freq: Every day | ORAL | 5 refills | Status: DC

## 2022-04-12 NOTE — Assessment & Plan Note (Signed)
Recommend smoke cessation.  

## 2022-04-12 NOTE — Assessment & Plan Note (Addendum)
Hemoglobin is stable.  Continue monitor. 

## 2022-04-12 NOTE — Assessment & Plan Note (Addendum)
Stage III right lung adenocarcinoma,  Recurrent S/p concurrent chemotherapy and radiation- On immunotherapy durvalumab maintenance Labs reviewed and discussed with patient.  Proceed with Durvalumab treatments Repeat CT in April 2024.

## 2022-04-12 NOTE — Patient Instructions (Signed)
[facility]  Discharge Instructions: Thank you for choosing [facility] to provide your oncology and hematology care.  If you have a lab appointment with the Cancer Center, please go directly to the Cancer Center and check in at the registration area.  Wear comfortable clothing and clothing appropriate for easy access to any Portacath or PICC line.   We strive to give you quality time with your provider. You may need to reschedule your appointment if you arrive late (15 or more minutes).  Arriving late affects you and other patients whose appointments are after yours.  Also, if you miss three or more appointments without notifying the office, you may be dismissed from the clinic at the provider's discretion.      For prescription refill requests, have your pharmacy contact our office and allow 72 hours for refills to be completed.    Today you received the following chemotherapy and/or immunotherapy agents: durvalumab    To help prevent nausea and vomiting after your treatment, we encourage you to take your nausea medication as directed.  BELOW ARE SYMPTOMS THAT SHOULD BE REPORTED IMMEDIATELY: *FEVER GREATER THAN 100.4 F (38 C) OR HIGHER *CHILLS OR SWEATING *NAUSEA AND VOMITING THAT IS NOT CONTROLLED WITH YOUR NAUSEA MEDICATION *UNUSUAL SHORTNESS OF BREATH *UNUSUAL BRUISING OR BLEEDING *URINARY PROBLEMS (pain or burning when urinating, or frequent urination) *BOWEL PROBLEMS (unusual diarrhea, constipation, pain near the anus) TENDERNESS IN MOUTH AND THROAT WITH OR WITHOUT PRESENCE OF ULCERS (sore throat, sores in mouth, or a toothache) UNUSUAL RASH, SWELLING OR PAIN  UNUSUAL VAGINAL DISCHARGE OR ITCHING   Items with * indicate a potential emergency and should be followed up as soon as possible or go to the Emergency Department if any problems should occur.  Please show the CHEMOTHERAPY ALERT CARD or IMMUNOTHERAPY ALERT CARD at check-in to  the Emergency Department and triage nurse.  Should you have questions after your visit or need to cancel or reschedule your appointment, please contact [facility]  [phone] and follow the prompts.  Office hours are 8:00 a.m. to 4:30 p.m. Monday - Friday. Please note that voicemails left after 4:00 p.m. may not be returned until the following business day.  We are closed weekends and major holidays. You have access to a nurse at all times for urgent questions. Please call the main number to the clinic [phone] and follow the prompts.  For any non-urgent questions, you may also contact your provider using MyChart. We now offer e-Visits for anyone 18 and older to request care online for non-urgent symptoms. For details visit [email or web address].   Also download the MyChart app! Go to the app store, search "MyChart", open the app, select [facility], and log in with your MyChart username and password.    

## 2022-04-12 NOTE — Progress Notes (Signed)
Hematology/Oncology Progress note [phone] [phone]     CHIEF COMPLAINTS/REASON FOR VISIT:  Follow up lung cancer treatments   ASSESSMENT & PLAN:   Cancer Staging  Cervical cancer ([place]) Staging form: Cervix Uteri, AJCC Version 9 - Clinical: FIGO Stage IIICr (cTX, cN1) - Unsigned  Primary lung adenocarcinoma ([place]) Staging form: Lung, AJCC 7th Edition - Clinical stage from 01/12/2021: T1, N1, M0 - Signed by [name], MD on 01/29/2021   Primary lung adenocarcinoma ([place]) Stage III right lung adenocarcinoma,  Recurrent S/p concurrent chemotherapy and radiation- On immunotherapy durvalumab maintenance Labs reviewed and discussed with patient.  Proceed with Durvalumab treatments    Anemia in chronic kidney disease (CODE) Hemoglobin is stable.  Continue monitor.  Cervical cancer ([place]) Locally advanced cervical cancer stage IIIC1 with pelvic nodes involvement. S/p concurrent chemotherapy and radiation followed by brachytherapy.  She is not actively following up with gynecology oncology.    Nausea without vomiting Intermittent chronic problem for patient, improved.  Continue Zofran and Compazine as needed.  Tobacco use Recommend smoke cessation.    Orders Placed This Encounter  Procedures   CT Chest Wo Contrast    Standing Status:   Future    Standing Expiration Date:   04/12/2023    Order Specific Question:   Preferred imaging location?    Answer:   [facility]   CBC with Differential    Standing Status:   Future    Standing Expiration Date:   05/24/2023   Comprehensive metabolic panel    Standing Status:   Future    Standing Expiration Date:   05/24/2023   Follow up in 2 weeks.  Lab MD Durvalumab.  All questions were answered. The patient knows to call the clinic with any problems, questions or concerns.  [name], MD, PhD [facility] Hematology Oncology 04/12/2022      HISTORY OF PRESENTING ILLNESS:  # Cervix  cancer Patient has developed postmenopausal vaginal bleeding and discharge.  She was noted to have a friable cervix/2 cm mass of the cervix, concerning for malignancy 12/19/2018 endometrial biopsy showed scattered atypical squamous cells suspicious for malignancy.  Predominantly necrosis with associated inflammation. 01/01/2019 initial cervix biopsy showed at least high-grade squamous intraepithelial lesion.-HPV negative 01/22/2019, repeat vaginal wall biopsy and cervix 9:00 biopsy showed squamous cell carcinoma.    Staging images 12/31/2018 showed fluid distending the endometrial canal and or endometrial thickening to the level of cervix.  No evidence of pathological lymphadenopathy.  Haziness about the lower cervix.  7 mm irregular pulmonary nodule within the right upper lobe, suspicious for possible primary or metastatic malignancy.  Chronic pleural-parenchymal scarring/fibrosis at the lung apices.  Large amount of stool.  No bowel obstruction. 01/08/2019 PET scan showed marked hypermetabolism in the region of the cervix, compatible with the reported history of cervical cancer. Small bilateral pelvic sidewall lymph nodes discernible FDG accumulation, concerning for metastatic disease although neither lymph node is enlarged by CT size criteria. 7 mm nodule in the right upper lobe shows discernible FDG accumulation.  Questionable neoplasm, primary versus metastatic. Emphysema.   # 02/20/2020 - 03/19/2020 concurrent chemotherapy weekly carboplatin and taxol and radiation for treatment of this cervix cancer.   Oncology History  Primary lung adenocarcinoma ([place])  11/20/2020 Imaging   CT chest with contrast showed minimal residual of previously seen right upper lobe nodule. 0.6 x 0.4 cm. Internal development of mild bandlike radiation fibrosis. Newly enlarged pretracheal lymph node, measuring 2.5 x 1.7 cm. Highly concerning for metastatic  disease. Unchanged prominent AP window and the right hilar lymph  nodes. Emphysema and diffuse bilateral bronchial wall thickening. Coronary artery disease.    12/13/2020 Imaging   PET scan showed enlarging hypermetabolic right lower paratracheal lymph node, compatible with metastatic disease. SUV 10.3. Mild FDG uptake of the bilateral sacral alla with associated lucency. Concerning for sacral insufficiency fracture. Stable posttreatment changes of the right upper lobe nodule. Nonspecific small solid 3 mm pulmonary nodule of the left lower lobe. 2 small to be characterized. No evidence of FDG avid metastatic disease in the abdomen or pelvis. Aortic atherosclerosis and emphysema.    01/12/2021 Initial Diagnosis   12/2019 lung nodules FDG avid, questionable primary bronchogenic carcinoma versus metastatic cancer.Discussed with radiation oncology. Her case was also discussed on tumor board. Consensus reached on finishing concurrent chemoradiation treatments for cervix cancer first. Possible SBRT to lung nodule for presumed primary lung cancer.  She was evaluated by Dr.Oaks. Options of biopsy via transthoracic or endobronchial approach vs surgical resection. Patient opted to empiric SBRT.  She lost follow up with me until Oct 2022   Primary lung adenocarcinoma [facility]) 11/28/22Patient re establish care with me  for lung cancer management.  01/12/2021 patient underwent bronchoscopy biopsy by Dr. Patsey Berthold Right paratracheal lymph node fine-needle aspiration is positive for metastatic non-small cell carcinoma, favor adenocarcinoma of lung origin.  Circulogen NGS negative PD-L1, ALK, ROS1, NTRK1/2/3,    01/12/2021 Cancer Staging   Staging form: Lung, AJCC 7th Edition - Clinical stage from 01/12/2021: T1, N1, M0 - Signed by [name], MD on 01/29/2021 Laterality: Right Stage used in treatment planning: Yes National guidelines used in treatment planning: Yes Type of national guideline used in treatment planning: NCCN   02/14/2021 - 03/28/2021 Chemotherapy   Concurrent  chemotherapy and Radiation  LUNG Carboplatin / Paclitaxel + XRT q7d      04/26/2021 Imaging   CT chest with contrast showed evolving postradiation changes in the posterior perifissural upper right lung without discrete residual measurable nodule in this location. Right hilar/mediastinal lymphadenopathy stable to decreased. Tiny 0.2 cm left lower lobe pulmonary nodule slightly decreased. Positive response to treatment. No new or progressive disease. Three-vessel coronary arthrosclerosis. Aortic atherosclerosis/emphysema.    05/19/2021 -  Chemotherapy   Patient is on Treatment Plan : LUNG Durvalumab (10) q14d     07/25/2021 Imaging   CT showed 1. Questionable slight enlargement a small left lower lobe nodule measuring up to 5 mm of the sagittal images compared with the most recent prior study. Attention on follow-up in approximately 6 months recommended.2. Otherwise stable chest CT with stable radiation changes superiorly in the right hemithorax and small residual right hilar lymph node.3. Coronary and aortic atherosclerosis (ICD10-I70.0). Emphysema   10/26/2021 Imaging   CT chest abdomen pelvis w contrast 1. Bandlike consolidative changes in the posterior RIGHT upper lobe appear more organized at the periphery. No focal masslike characteristics. Findings are compatible with evolving post treatment changes. Findings are favored to represent evolving post treatment changes. 2. Nodule in the LEFT lower lobe a 4 mm may be very slowly increasing in size over time, certainly as compared to March of 2023. Continued attention on follow-up is suggested. 3. Stable 9 mm RIGHT hilar lymph node. 4. Evidence of sacral insufficiency fractures not substantially changed. 5. Pulmonary emphysema and aortic atherosclerosis.     01/31/2022 Imaging   CT chest w contrast 1. Nodular areas developing of bandlike post treatment changes are of uncertain significance. While potentially evolving post treatment changes  increasingly nodular foci amidst expected consolidative changes a mildly suspicious. PET imaging or continued short interval follow-up could be considered. 2. New small irregular nodules in the RIGHT lower lobe measuring 3-4 mm with some indistinct adjacent septal thickening. These are nonspecific and could be infectious or inflammatory. Particularly given the presence of material in the RIGHT lower lobe bronchus. 3. Stable 4 mm LEFT lower lobe pulmonary nodule. 4. Three-vessel coronary artery calcification as on previous imaging quite extensive. 5. Emphysema and aortic atherosclerosis.  Aortic Atherosclerosis and Emphysema   02/12/2022 Imaging   CT abdomen pelvis wo contrast Large stool burden throughout the colon suggesting constipation. Aortoiliac atherosclerosis. No acute findings.   Cervical cancer ([place])  02/03/2019 Initial Diagnosis   Malignant neoplasm of cervix ([place])   02/20/2019 - 03/20/2019 Chemotherapy   Patient is on Treatment Plan : Carboplatin q7d w/ XRT     02/14/2021 - 03/28/2021 Chemotherapy   Concurrent chemotherapy and Radiation  LUNG Carboplatin / Paclitaxel + XRT q7d         INTERVAL HISTORY KYLIN BORSA is a 77 y.o. female who has above history reviewed by me today presents for follow up visit for management of possible recurrent lung cancer.  History of cervix cancer.   She tolerates immunotherapy well.  She has no new complaints.  Weight is stable. Back pain is controlled with pain medication. Nausea has been better, she takes PRN home antiemetics.   Review of Systems  Constitutional:  Positive for appetite change, fatigue and unexpected weight change. Negative for chills and fever.  HENT:   Positive for hearing loss. Negative for voice change.   Eyes:  Negative for eye problems.  Respiratory:  Positive for cough. Negative for chest tightness.   Cardiovascular:  Negative for chest pain.  Gastrointestinal:  Positive for nausea. Negative for abdominal  distention, abdominal pain and blood in stool.  Endocrine: Negative for hot flashes.  Genitourinary:  Negative for difficulty urinating and frequency.   Musculoskeletal:  Negative for arthralgias.  Skin:  Negative for itching and rash.  Neurological:  Negative for extremity weakness.  Hematological:  Negative for adenopathy.  Psychiatric/Behavioral:  Negative for confusion.     MEDICAL HISTORY:  Past Medical History:  Diagnosis Date   Arthritis    Cancer ([place])    Carotid atherosclerosis    Carotid bruit    Cervical cancer (HCC)    COPD (chronic obstructive pulmonary disease) (HCC)    emphysema   Coronary artery disease    Family history of adverse reaction to anesthesia    paternal grandfather died during surgery-pt unaware what happened   GERD (gastroesophageal reflux disease)    Hyperlipidemia    Hypertension    Hyponatremia 02/27/2019   Hypothyroidism    Infected cat bite 1980s   was hospitalized   Irregular heartbeat    Medical history non-contributory    Peripheral vascular disease ([place])    Renal insufficiency    Tobacco use     SURGICAL HISTORY: Past Surgical History:  Procedure Laterality Date   CAROTID ENDARTERECTOMY Right Oct. 2015   Dr. Lucky Cowboy   CAROTID STENOSIS Left April 2016   carotid stenosis surgery   COLONOSCOPY WITH PROPOFOL N/A 11/08/2016   Procedure: COLONOSCOPY WITH PROPOFOL;  Surgeon: Jonathon Bellows, MD;  Location[facility] ENDOSCOPY;  Service: Gastroenterology;  Laterality: N/A;   ESOPHAGOGASTRODUODENOSCOPY (EGD) WITH PROPOFOL N/A 11/08/2016   Procedure: ESOPHAGOGASTRODUODENOSCOPY (EGD) WITH PROPOFOL;  Surgeon: Jonathon Bellows, MD;  Location: [facility] ENDOSCOPY;  Service: Gastroenterology;  Laterality:  N/A;   FLEXIBLE BRONCHOSCOPY N/A 12/17/2014   Procedure: FLEXIBLE BRONCHOSCOPY;  Surgeon: Wilhelmina Mcardle, MD;  Location: [facility] ORS;  Service: Pulmonary;  Laterality: N/A;   INCISION AND DRAINAGE / EXCISION THYROGLOSSAL CYST  April 2011   PORTA CATH INSERTION N/A  03/03/2021   Procedure: PORTA CATH INSERTION;  Surgeon: Algernon Huxley, MD;  Location: [place] CV LAB;  Service: Cardiovascular;  Laterality: N/A;   VIDEO BRONCHOSCOPY WITH ENDOBRONCHIAL ULTRASOUND N/A 01/12/2021   Procedure: VIDEO BRONCHOSCOPY WITH ENDOBRONCHIAL ULTRASOUND;  Surgeon: Tyler Pita, MD;  Location: [facility] ORS;  Service: Cardiopulmonary;  Laterality: N/A;    SOCIAL HISTORY: Social History   Socioeconomic History   Marital status: Married    Spouse name: Deidre Ala   Number of children: Not on file   Years of education: Not on file   Highest education level: 9th grade  Occupational History   Occupation: retired   Tobacco Use   Smoking status: Every Day    Packs/day: 0.50    Years: 50.00    Total pack years: 25.00    Types: Cigarettes   Smokeless tobacco: Never   Tobacco comments:    0.5 PPD 02/10/2021  Vaping Use   Vaping Use: Former  Substance and Sexual Activity   Alcohol use: No    Alcohol/week: 0.0 standard drinks of alcohol   Drug use: No   Sexual activity: Yes  Other Topics Concern   Not on file  Social History Narrative   Lives at home with husband   Social Determinants of Health   Financial Resource Strain: Low Risk  (03/15/2021)   Overall Financial Resource Strain (CARDIA)    Difficulty of Paying Living Expenses: Not hard at all  Food Insecurity: No Food Insecurity (03/15/2021)   Hunger Vital Sign    Worried About Running Out of Food in the Last Year: Never true    Clinch in the Last Year: Never true  Transportation Needs: No Transportation Needs (03/15/2021)   PRAPARE - Hydrologist (Medical): No    Lack of Transportation (Non-Medical): No  Physical Activity: Inactive (03/15/2021)   Exercise Vital Sign    Days of Exercise per Week: 0 days    Minutes of Exercise per Session: 0 min  Stress: No Stress Concern Present (03/15/2021)   [place]     Feeling of Stress : Not at all  Social Connections: Moderately Isolated (03/15/2021)   Social Connection and Isolation Panel [NHANES]    Frequency of Communication with Friends and Family: More than three times a week    Frequency of Social Gatherings with Friends and Family: More than three times a week    Attends Religious Services: Never    Marine scientist or Organizations: No    Attends Archivist Meetings: Never    Marital Status: Married  Human resources officer Violence: Not At Risk (03/15/2021)   Humiliation, Afraid, Rape, and Kick questionnaire    Fear of Current or Ex-Partner: No    Emotionally Abused: No    Physically Abused: No    Sexually Abused: No    FAMILY HISTORY: Family History  Problem Relation Age of Onset   Congestive Heart Failure Mother    Heart disease Mother    Hypertension Mother    Hypertension Sister    Diabetes Sister    Heart disease Sister    Heart disease Maternal Uncle  Heart disease Maternal Grandmother    Stroke Maternal Grandfather    Cancer Brother        liver, lung   Heart disease Brother    Hypertension Brother    Heart attack Brother    COPD Neg Hx    Breast cancer Neg Hx     ALLERGIES:  is allergic to atorvastatin.  MEDICATIONS:  Current Outpatient Medications  Medication Sig Dispense Refill   acetaminophen (TYLENOL) 500 MG tablet Take 500 mg by mouth every 6 (six) hours as needed for moderate pain.     albuterol (VENTOLIN HFA) 108 (90 Base) MCG/ACT inhaler Inhale 2 puffs into the lungs every 6 (six) hours as needed for wheezing or shortness of breath. 8 g 6   alendronate (FOSAMAX) 70 MG tablet TAKE 1 TABLET EVERY 7 DAYS . TAKE WITH A FULL GLASS OF WATER ON AN EMPTY STOMACH. 12 tablet 3   clopidogrel (PLAVIX) 75 MG tablet Take 1 tablet (75 mg total) by mouth daily. 90 tablet 1   cyclobenzaprine (FLEXERIL) 10 MG tablet Take 1 tablet (10 mg total) by mouth at bedtime. 90 tablet 1   docusate sodium (COLACE) 100 MG  capsule Take 1 capsule (100 mg total) by mouth daily. 30 capsule 4   Fluticasone-Umeclidin-Vilant (TRELEGY ELLIPTA) 100-62.5-25 MCG/ACT AEPB Inhale 1 puff into the lungs daily. 1 each 11   gabapentin (NEURONTIN) 100 MG capsule Take 1 capsule (100 mg total) by mouth 3 (three) times daily. 270 capsule 1   Iron-Vitamin C 65-125 MG TABS Take 1 tablet by mouth daily. 30 tablet 3   levothyroxine (SYNTHROID) 88 MCG tablet Take 1 tablet (88 mcg total) by mouth daily. 90 tablet 3   lidocaine (LIDODERM) 5 % Place 1 patch onto the skin daily. Remove & Discard patch within 12 hours or as directed by MD 90 patch 1   lidocaine-prilocaine (EMLA) cream Apply 1 application topically as needed. 60 g 3   Multiple Vitamin (MULTIVITAMIN) tablet Take 1 tablet by mouth daily.     omeprazole (PRILOSEC) 20 MG capsule Take 1 capsule (20 mg total) by mouth daily. 90 capsule 3   ondansetron (ZOFRAN) 8 MG tablet Take 1 tablet (8 mg total) by mouth every 8 (eight) hours as needed for refractory nausea / vomiting. Start on day 3 after chemo. 30 tablet 1   prochlorperazine (COMPAZINE) 10 MG tablet Take 1 tablet (10 mg total) by mouth every 6 (six) hours as needed for nausea or vomiting. 30 tablet 0   traMADol (ULTRAM) 50 MG tablet Take 1-2 tablets (50-100 mg total) by mouth every 12 (twelve) hours as needed. 180 tablet 0   vitamin B-12 (CYANOCOBALAMIN) 500 MCG tablet Take 500 mcg by mouth daily.     chlorhexidine (PERIDEX) 0.12 % solution Use as directed 5 mLs in the mouth or throat 2 (two) times daily. (Patient not taking: Reported on 04/12/2022) 120 mL 0   No current facility-administered medications for this visit.     PHYSICAL EXAMINATION: ECOG PERFORMANCE STATUS: 1 - Symptomatic but completely ambulatory Vitals:   04/12/22 1037  BP: (!) 146/66  Pulse: 88  Resp: 18  Temp: 97.6 F (36.4 C)    Filed Weights   04/12/22 1037  Weight: 127 lb 3.2 oz (57.7 kg)     Physical Exam Constitutional:      General: She is  not in acute distress. HENT:     Head: Normocephalic and atraumatic.  Eyes:     General: No scleral icterus.  Pupils: Pupils are equal, round, and reactive to light.  Cardiovascular:     Rate and Rhythm: Normal rate.  Pulmonary:     Effort: Pulmonary effort is normal. No respiratory distress.     Breath sounds: No wheezing.     Comments: Decreased breath sound bilaterally  Abdominal:     General: Bowel sounds are normal. There is no distension.     Palpations: Abdomen is soft.  Musculoskeletal:        General: No deformity. Normal range of motion.     Cervical back: Normal range of motion and neck supple.     Comments: Trace edema bilaterally  Skin:    General: Skin is warm and dry.  Neurological:     Mental Status: She is alert and oriented to person, place, and time. Mental status is at baseline.     Cranial Nerves: No cranial nerve deficit.     Coordination: Coordination normal.  Psychiatric:        Mood and Affect: Mood normal.      LABORATORY DATA:  I have reviewed the data as listed    Latest Ref Rng & Units 04/12/2022   10:08 AM 04/03/2022    1:23 PM 03/29/2022    9:47 AM  CBC  WBC 4.0 - 10.5 K/uL 5.9  6.3  6.3   Hemoglobin 12.0 - 15.0 g/dL 9.7  11.0  9.4   Hematocrit 36.0 - 46.0 % 29.2  32.2  28.3   Platelets 150 - 400 K/uL 220  307  256       Latest Ref Rng & Units 04/12/2022   10:08 AM 04/03/2022    1:23 PM 03/29/2022    9:47 AM  CMP  Glucose 70 - 99 mg/dL 119  77  109   BUN 8 - 23 mg/dL '18  15  18   '$ Creatinine 0.44 - 1.00 mg/dL 1.14  1.35  1.25   Sodium 135 - 145 mmol/L 132  136  132   Potassium 3.5 - 5.1 mmol/L 3.9  4.8  4.2   Chloride 98 - 111 mmol/L 102  99  101   CO2 22 - 32 mmol/L '22  20  23   '$ Calcium 8.9 - 10.3 mg/dL 9.0  9.9  8.8   Total Protein 6.5 - 8.1 g/dL 7.0  6.9  6.8   Total Bilirubin 0.3 - 1.2 mg/dL 0.3  <0.2  0.2   Alkaline Phos 38 - 126 U/L 76  89  66   AST 15 - 41 U/L '28  15  21   '$ ALT 0 - 44 U/L '18  11  12       '$ Iron/TIBC/Ferritin/ %Sat    Component Value Date/Time   IRON 64 03/01/2022 1007   TIBC 237 (L) 03/01/2022 1007   FERRITIN 90 03/01/2022 1007   IRONPCTSAT 27 03/01/2022 1007      RADIOGRAPHIC STUDIES: I have personally reviewed the radiological images as listed and agreed with the findings in the report. CT ABDOMEN PELVIS W CONTRAST  Result Date: 02/12/2022 CLINICAL DATA:  Abdominal pain, acute, nonlocalized EXAM: CT ABDOMEN AND PELVIS WITH CONTRAST TECHNIQUE: Multidetector CT imaging of the abdomen and pelvis was performed using the standard protocol following bolus administration of intravenous contrast. RADIATION DOSE REDUCTION: This exam was performed according to the departmental dose-optimization program which includes automated exposure control, adjustment of the mA and/or kV according to patient size and/or use of iterative reconstruction technique. CONTRAST:  85m OMNIPAQUE IOHEXOL 300  MG/ML  SOLN COMPARISON:  10/25/2021 FINDINGS: Lower chest: No acute abnormality Hepatobiliary: No focal hepatic abnormality. Gallbladder unremarkable. Pancreas: No focal abnormality or ductal dilatation. Spleen: No focal abnormality.  Normal size. Adrenals/Urinary Tract: No adrenal abnormality. No focal renal abnormality. No stones or hydronephrosis. Urinary bladder is unremarkable. Stomach/Bowel: Large stool burden throughout the colon suggesting constipation. Stomach and small bowel decompressed. Vascular/Lymphatic: Diffuse aortoiliac atherosclerosis. No evidence of aneurysm or adenopathy. Reproductive: Uterus and adnexa unremarkable.  No mass. Other: No free fluid or free air. Musculoskeletal: No acute bony abnormality. IMPRESSION: Large stool burden throughout the colon suggesting constipation. Aortoiliac atherosclerosis. No acute findings. Electronically Signed   By: Rolm Baptise M.D.   On: 02/12/2022 18:06   CT Chest W Contrast  Result Date: 02/01/2022 CLINICAL DATA:  77 year old female presents  for follow-up of lung cancer. * Tracking Code: BO * EXAM: CT CHEST WITH CONTRAST TECHNIQUE: Multidetector CT imaging of the chest was performed during intravenous contrast administration. RADIATION DOSE REDUCTION: This exam was performed according to the departmental dose-optimization program which includes automated exposure control, adjustment of the mA and/or kV according to patient size and/or use of iterative reconstruction technique. CONTRAST:  10m OMNIPAQUE IOHEXOL 300 MG/ML  SOLN COMPARISON:  October 25, 2021 FINDINGS: Cardiovascular: Calcified and noncalcified aortic atherosclerotic plaque. Normal heart size. Three-vessel coronary artery calcification as on previous imaging quite extensive. LEFT-sided IJ Port-A-Cath terminates in the mid RIGHT atrium. Central pulmonary vasculature is unremarkable on venous phase. Mediastinum/Nodes: Mild generalized thickening of the esophagus is stable and likely post treatment related. RIGHT hilar lymph node (image 65/2) 8 mm, unchanged. No new signs of adenopathy in the chest. Lungs/Pleura: Pulmonary emphysema as on previous imaging. Material in the dependent aspect of the bronchus intermedius in the RIGHT chest. Bandlike consolidative changes extend from the RIGHT hilum into the peripheral RIGHT chest. Associated septal thickening and bandlike changes are more pronounced than on previous imaging. Nodular area amidst bandlike changes at the site of previous nodule measuring 7 mm greatest craniocaudal extent previously greatest area of more bandlike and non nodular change at 5 mm greatest thickness, another area on image 78/5 measuring 7 mm as well. More peripheral area of nodularity (image 67/5 appears bandlike with some thickening medially. LEFT lower lobe pulmonary nodule stable at 4 mm (image 101/3) New small irregular nodules in the RIGHT lower lobe (image 91/3 and image 88/3 measuring 3-4 mm with some indistinct adjacent septal thickening. Mild apical septal  thickening generalized throughout the LEFT and RIGHT upper lobes greatest on the RIGHT similar to previous imaging. Upper Abdomen: Incidental imaging of upper abdominal contents without acute process. Musculoskeletal: No chest wall mass.  No acute bone finding. IMPRESSION: 1. Nodular areas developing of bandlike post treatment changes are of uncertain significance. While potentially evolving post treatment changes increasingly nodular foci amidst expected consolidative changes a mildly suspicious. PET imaging or continued short interval follow-up could be considered. 2. New small irregular nodules in the RIGHT lower lobe measuring 3-4 mm with some indistinct adjacent septal thickening. These are nonspecific and could be infectious or inflammatory. Particularly given the presence of material in the RIGHT lower lobe bronchus. 3. Stable 4 mm LEFT lower lobe pulmonary nodule. 4. Three-vessel coronary artery calcification as on previous imaging quite extensive. 5. Emphysema and aortic atherosclerosis. Aortic Atherosclerosis (ICD10-I70.0) and Emphysema (ICD10-J43.9). Electronically Signed   By: GZetta BillsM.D.   On: 02/01/2022 14:57

## 2022-04-12 NOTE — Assessment & Plan Note (Signed)
Intermittent chronic problem for patient, improved.  Continue Zofran and Compazine as needed. 

## 2022-04-12 NOTE — Assessment & Plan Note (Signed)
Locally advanced cervical cancer stage IIIC1 with pelvic nodes involvement. S/p concurrent chemotherapy and radiation followed by brachytherapy.  She is not actively following up with gynecology oncology.   

## 2022-04-13 ENCOUNTER — Ambulatory Visit: Payer: Medicare PPO

## 2022-04-13 ENCOUNTER — Other Ambulatory Visit: Payer: Medicare PPO

## 2022-04-13 ENCOUNTER — Other Ambulatory Visit: Payer: Self-pay | Admitting: Family Medicine

## 2022-04-13 NOTE — Telephone Encounter (Signed)
Requested medication (s) are due for refill today:   Requested medication (s) are on the active medication list: No  Last refill:    Future visit scheduled: Yes  Notes to clinic:  Not on medication list.    Requested Prescriptions  Pending Prescriptions Disp Refills   lisinopril (ZESTRIL) 20 MG tablet [Pharmacy Med Name: LISINOPRIL 20 MG Tablet] 90 tablet 3    Sig: TAKE 1 TABLET EVERY DAY     Cardiovascular:  ACE Inhibitors Failed - 04/13/2022  2:34 AM      Failed - Cr in normal range and within 180 days    Creatinine  Date Value Ref Range Status  05/14/2014 0.78 mg/dL Final    Comment:    0.44-1.00 NOTE: New Reference Range  04/07/14    Creatinine, Ser  Date Value Ref Range Status  04/12/2022 1.14 (H) 0.44 - 1.00 mg/dL Final         Failed - Last BP in normal range    BP Readings from Last 1 Encounters:  04/12/22 (!) 146/66         Passed - K in normal range and within 180 days    Potassium  Date Value Ref Range Status  04/12/2022 3.9 3.5 - 5.1 mmol/L Final  05/14/2014 4.3 mmol/L Final    Comment:    3.5-5.1 NOTE: New Reference Range  04/07/14          Passed - Patient is not pregnant      Passed - Valid encounter within last 6 months    Recent Outpatient Visits           1 week ago Encounter for [employer] annual wellness exam   [place], Megan P, DO   1 month ago Essential hypertension   [place], Megan P, DO   5 months ago Cutaneous abscess of neck   [place], Dani Gobble, PA-C   1 year ago LUQ pain   [place], [place], DO   1 year ago Essential hypertension   [place], Barb Merino, DO       Future Appointments             In 1 week Wynetta Emery, Barb Merino, DO [place]

## 2022-04-14 NOTE — Telephone Encounter (Signed)
Does she have enough to get to her appt

## 2022-04-14 NOTE — Telephone Encounter (Signed)
Spoke with patient to follow up on Rx request. Patient says when she was last seen by Dr Wynetta Emery. Provider took patient's prescription for Lisinopril as her blood pressure reading was low. Patient says when they pharmacy called in regards to her refill request it was an automated recording and she says she was unable to inform them she is no longer on the medication. Rx request was refused.

## 2022-04-25 ENCOUNTER — Ambulatory Visit (INDEPENDENT_AMBULATORY_CARE_PROVIDER_SITE_OTHER): Payer: Medicare PPO | Admitting: Family Medicine

## 2022-04-25 ENCOUNTER — Other Ambulatory Visit: Payer: Self-pay | Admitting: Oncology

## 2022-04-25 ENCOUNTER — Ambulatory Visit
Admission: RE | Admit: 2022-04-25 | Discharge: 2022-04-25 | Disposition: A | Discharge status: Home / Self Care | Payer: Medicare PPO | Source: Ambulatory Visit | Attending: Family Medicine | Admitting: Family Medicine

## 2022-04-25 ENCOUNTER — Other Ambulatory Visit: Payer: Self-pay | Admitting: Family Medicine

## 2022-04-25 ENCOUNTER — Ambulatory Visit
Admission: RE | Admit: 2022-04-25 | Discharge: 2022-04-25 | Disposition: A | Discharge status: Home / Self Care | Payer: Medicare PPO | Attending: Family Medicine | Admitting: Family Medicine

## 2022-04-25 ENCOUNTER — Encounter: Payer: Self-pay | Admitting: Family Medicine

## 2022-04-25 VITALS — BP 121/77 | HR 93 | Temp 98.3°F | Ht 68.0 in | Wt 126.3 lb

## 2022-04-25 DIAGNOSIS — M79671 Pain in right foot: Secondary | ICD-10-CM | POA: Diagnosis not present

## 2022-04-25 DIAGNOSIS — I10 Essential (primary) hypertension: Secondary | ICD-10-CM

## 2022-04-25 MED ORDER — NAPROXEN 500 MG PO TABS: 500.0000 mg | ORAL_TABLET | Freq: Every day | ORAL | 3 refills | Status: DC

## 2022-04-25 NOTE — Assessment & Plan Note (Signed)
BP much better off medicine. Not hypotensive. Will check labs. Continue to monitor. Call with any concerns.

## 2022-04-25 NOTE — Progress Notes (Signed)
BP 121/77   Pulse 93   Temp 98.3 F (36.8 C) (Oral)   Ht 5\' 8"  (1.727 m)   Wt 126 lb 4.8 oz (57.3 kg)   SpO2 98%   BMI 19.20 kg/m    Subjective:    Patient ID: Mary Hoover, female    DOB: 1945/03/15, 77 y.o.   MRN: [id number]  HPI: Mary Hoover is a 77 y.o. female  Chief Complaint  Patient presents with   Hypertension   Foot Problem    Patient says she notices a knot on the side of her R foot. Patient says she first noticed it a few months ago, but the pain is getting worse and it keeps her up at night. Patient declines taking any medication to help with the pain. Patient says the pain and discomfort comes and goes.    HYPOTENSION  Hypotension status: better  Satisfied with current treatment? yes Duration of hypertension: chronic BP monitoring frequency:  not checking BP medication side effects:  no Medication compliance: not on anything anymore Aspirin: no Recurrent headaches: no Visual changes: no Palpitations: no Dyspnea: no Chest pain: no Lower extremity edema: no Dizzy/lightheaded: no  FOOT PAIN Duration: weeks Involved foot: right Mechanism of injury: unknown Location: head of 5th metatarsal Onset: gradual  Severity: severe  Quality:  aching and sore Frequency: constant Radiation: no Aggravating factors: weight bearing and walking and touching it Alleviating factors: nothing  Status: worse Treatments attempted: rest, ice, and heat  Relief with NSAIDs?:  No NSAIDs Taken Weakness with weight bearing or walking: no Morning stiffness: no Swelling: no Redness: no Bruising: no Paresthesias / decreased sensation: no  Fevers:no   Relevant past medical, surgical, family and social history reviewed and updated as indicated. Interim medical history since our last visit reviewed. Allergies and medications reviewed and updated.  Review of Systems  Constitutional: Negative.   Respiratory: Negative.    Cardiovascular: Negative.    Gastrointestinal: Negative.   Musculoskeletal:  Positive for arthralgias. Negative for back pain, gait problem, joint swelling, myalgias, neck pain and neck stiffness.  Skin: Negative.   Neurological: Negative.   Psychiatric/Behavioral: Negative.      Per HPI unless specifically indicated above     Objective:    BP 121/77   Pulse 93   Temp 98.3 F (36.8 C) (Oral)   Ht 5\' 8"  (1.727 m)   Wt 126 lb 4.8 oz (57.3 kg)   SpO2 98%   BMI 19.20 kg/m   Wt Readings from Last 3 Encounters:  04/25/22 126 lb 4.8 oz (57.3 kg)  04/12/22 127 lb 3.2 oz (57.7 kg)  04/03/22 125 lb 8 oz (56.9 kg)    Physical Exam Vitals and nursing note reviewed.  Constitutional:      General: She is not in acute distress.    Appearance: Normal appearance. She is normal weight. She is not ill-appearing, toxic-appearing or diaphoretic.  HENT:     Head: Normocephalic and atraumatic.     Right Ear: External ear normal.     Left Ear: External ear normal.     Nose: Nose normal.     Mouth/Throat:     Mouth: Mucous membranes are moist.     Pharynx: Oropharynx is clear.  Eyes:     General: No scleral icterus.       Right eye: No discharge.        Left eye: No discharge.     Extraocular Movements: Extraocular movements intact.  Conjunctiva/sclera: Conjunctivae normal.     Pupils: Pupils are equal, round, and reactive to light.  Cardiovascular:     Rate and Rhythm: Normal rate and regular rhythm.     Pulses: Normal pulses.     Heart sounds: Normal heart sounds. No murmur heard.    No friction rub. No gallop.  Pulmonary:     Effort: Pulmonary effort is normal. No respiratory distress.     Breath sounds: Normal breath sounds. No stridor. No wheezing, rhonchi or rales.  Chest:     Chest wall: No tenderness.  Musculoskeletal:        General: Tenderness (5th metatarsal on R foot) present. Normal range of motion.     Cervical back: Normal range of motion and neck supple.  Skin:    General: Skin is warm and  dry.     Capillary Refill: Capillary refill takes less than 2 seconds.     Coloration: Skin is not jaundiced or pale.     Findings: No bruising, erythema, lesion or rash.  Neurological:     General: No focal deficit present.     Mental Status: She is alert and oriented to person, place, and time. Mental status is at baseline.  Psychiatric:        Mood and Affect: Mood normal.        Behavior: Behavior normal.        Thought Content: Thought content normal.        Judgment: Judgment normal.     Results for orders placed or performed in visit on 04/12/22  T4, free  Result Value Ref Range   Free T4 1.05 0.61 - 1.12 ng/dL  CBC with Differential  Result Value Ref Range   WBC 5.9 4.0 - 10.5 K/uL   RBC 3.02 (L) 3.87 - 5.11 MIL/uL   Hemoglobin 9.7 (L) 12.0 - 15.0 g/dL   HCT 29.2 (L) 36.0 - 46.0 %   MCV 96.7 80.0 - 100.0 fL   MCH 32.1 26.0 - 34.0 pg   MCHC 33.2 30.0 - 36.0 g/dL   RDW 14.4 11.5 - 15.5 %   Platelets 220 150 - 400 K/uL   nRBC 0.0 0.0 - 0.2 %   Neutrophils Relative % 77 %   Neutro Abs 4.5 1.7 - 7.7 K/uL   Lymphocytes Relative 10 %   Lymphs Abs 0.6 (L) 0.7 - 4.0 K/uL   Monocytes Relative 9 %   Monocytes Absolute 0.5 0.1 - 1.0 K/uL   Eosinophils Relative 3 %   Eosinophils Absolute 0.2 0.0 - 0.5 K/uL   Basophils Relative 0 %   Basophils Absolute 0.0 0.0 - 0.1 K/uL   Immature Granulocytes 1 %   Abs Immature Granulocytes 0.03 0.00 - 0.07 K/uL  Comprehensive metabolic panel  Result Value Ref Range   Sodium 132 (L) 135 - 145 mmol/L   Potassium 3.9 3.5 - 5.1 mmol/L   Chloride 102 98 - 111 mmol/L   CO2 22 22 - 32 mmol/L   Glucose, Bld 119 (H) 70 - 99 mg/dL   BUN 18 8 - 23 mg/dL   Creatinine, Ser 1.14 (H) 0.44 - 1.00 mg/dL   Calcium 9.0 8.9 - 10.3 mg/dL   Total Protein 7.0 6.5 - 8.1 g/dL   Albumin 3.6 3.5 - 5.0 g/dL   AST 28 15 - 41 U/L   ALT 18 0 - 44 U/L   Alkaline Phosphatase 76 38 - 126 U/L   Total Bilirubin 0.3 0.3 - 1.2  mg/dL   GFR, Estimated 50 (L) >60  mL/min   Anion gap 8 5 - 15  TSH  Result Value Ref Range   TSH 1.559 0.350 - 4.500 uIU/mL      Assessment & Plan:   Problem List Items Addressed This Visit       Cardiovascular and Mediastinum   Essential hypertension    BP much better off medicine. Not hypotensive. Will check labs. Continue to monitor. Call with any concerns.       Relevant Orders   Basic metabolic panel   Other Visit Diagnoses     Right foot pain    -  Primary   Will check x-ray and start naproxen. Call with any concerns. Continue to monitor.   Relevant Orders   DG Foot Complete Right        Follow up plan: Return in about 5 months (around 10/02/2022) for 6 month follow up.

## 2022-04-26 ENCOUNTER — Inpatient Hospital Stay: Payer: Medicare PPO

## 2022-04-26 ENCOUNTER — Inpatient Hospital Stay (HOSPITAL_BASED_OUTPATIENT_CLINIC_OR_DEPARTMENT_OTHER): Payer: Medicare PPO | Admitting: Oncology

## 2022-04-26 ENCOUNTER — Encounter: Payer: Self-pay | Admitting: Oncology

## 2022-04-26 VITALS — BP 131/58 | HR 95 | Temp 97.6°F | Resp 18 | Wt 125.7 lb

## 2022-04-26 DIAGNOSIS — Z79899 Other long term (current) drug therapy: Secondary | ICD-10-CM | POA: Diagnosis not present

## 2022-04-26 DIAGNOSIS — C3491 Malignant neoplasm of unspecified part of right bronchus or lung: Secondary | ICD-10-CM

## 2022-04-26 DIAGNOSIS — F17210 Nicotine dependence, cigarettes, uncomplicated: Secondary | ICD-10-CM | POA: Diagnosis not present

## 2022-04-26 DIAGNOSIS — N183 Chronic kidney disease, stage 3 unspecified: Secondary | ICD-10-CM | POA: Diagnosis not present

## 2022-04-26 DIAGNOSIS — D631 Anemia in chronic kidney disease: Secondary | ICD-10-CM | POA: Diagnosis not present

## 2022-04-26 DIAGNOSIS — N1831 Chronic kidney disease, stage 3a: Secondary | ICD-10-CM | POA: Diagnosis not present

## 2022-04-26 DIAGNOSIS — Z5112 Encounter for antineoplastic immunotherapy: Secondary | ICD-10-CM | POA: Diagnosis not present

## 2022-04-26 DIAGNOSIS — Z720 Tobacco use: Secondary | ICD-10-CM | POA: Diagnosis not present

## 2022-04-26 DIAGNOSIS — I129 Hypertensive chronic kidney disease with stage 1 through stage 4 chronic kidney disease, or unspecified chronic kidney disease: Secondary | ICD-10-CM | POA: Diagnosis not present

## 2022-04-26 DIAGNOSIS — C539 Malignant neoplasm of cervix uteri, unspecified: Secondary | ICD-10-CM | POA: Diagnosis not present

## 2022-04-26 LAB — CBC WITH DIFFERENTIAL/PLATELET
Abs Immature Granulocytes: 0.03 10*3/uL (ref 0.00–0.07)
Basophils Absolute: 0 10*3/uL (ref 0.0–0.1)
Basophils Relative: 0 %
Eosinophils Absolute: 0.1 10*3/uL (ref 0.0–0.5)
Eosinophils Relative: 3 %
HCT: 32.5 % — ABNORMAL LOW (ref 36.0–46.0)
Hemoglobin: 11.1 g/dL — ABNORMAL LOW (ref 12.0–15.0)
Immature Granulocytes: 1 %
Lymphocytes Relative: 13 %
Lymphs Abs: 0.7 10*3/uL (ref 0.7–4.0)
MCH: 32.1 pg (ref 26.0–34.0)
MCHC: 34.2 g/dL (ref 30.0–36.0)
MCV: 93.9 fL (ref 80.0–100.0)
Monocytes Absolute: 0.5 10*3/uL (ref 0.1–1.0)
Monocytes Relative: 10 %
Neutro Abs: 3.8 10*3/uL (ref 1.7–7.7)
Neutrophils Relative %: 73 %
Platelets: 249 10*3/uL (ref 150–400)
RBC: 3.46 MIL/uL — ABNORMAL LOW (ref 3.87–5.11)
RDW: 14 % (ref 11.5–15.5)
WBC: 5.2 10*3/uL (ref 4.0–10.5)
nRBC: 0 % (ref 0.0–0.2)

## 2022-04-26 LAB — BASIC METABOLIC PANEL
BUN/Creatinine Ratio: 12 (ref 12–28)
BUN: 12 mg/dL (ref 8–27)
CO2: 17 mmol/L — ABNORMAL LOW (ref 20–29)
Calcium: 9.7 mg/dL (ref 8.7–10.3)
Chloride: 97 mmol/L (ref 96–106)
Creatinine, Ser: 0.99 mg/dL (ref 0.57–1.00)
Glucose: 85 mg/dL (ref 70–99)
Potassium: 4.6 mmol/L (ref 3.5–5.2)
Sodium: 135 mmol/L (ref 134–144)
eGFR: 59 mL/min/{1.73_m2} — ABNORMAL LOW (ref >59)

## 2022-04-26 LAB — COMPREHENSIVE METABOLIC PANEL
ALT: 12 U/L (ref 0–44)
AST: 21 U/L (ref 15–41)
Albumin: 3.8 g/dL (ref 3.5–5.0)
Alkaline Phosphatase: 72 U/L (ref 38–126)
Anion gap: 7 (ref 5–15)
BUN: 15 mg/dL (ref 8–23)
CO2: 22 mmol/L (ref 22–32)
Calcium: 9.2 mg/dL (ref 8.9–10.3)
Chloride: 101 mmol/L (ref 98–111)
Creatinine, Ser: 1 mg/dL (ref 0.44–1.00)
GFR, Estimated: 59 mL/min — ABNORMAL LOW (ref >60)
Glucose, Bld: 105 mg/dL — ABNORMAL HIGH (ref 70–99)
Potassium: 3.9 mmol/L (ref 3.5–5.1)
Sodium: 130 mmol/L — ABNORMAL LOW (ref 135–145)
Total Bilirubin: 0.4 mg/dL (ref 0.3–1.2)
Total Protein: 7.1 g/dL (ref 6.5–8.1)

## 2022-04-26 MED ORDER — HEPARIN SOD (PORK) LOCK FLUSH 100 UNIT/ML IV SOLN
500.0000 [IU] | Freq: Once | INTRAVENOUS | Status: AC | PRN
  Filled 2022-04-26: qty 5

## 2022-04-26 MED ORDER — SODIUM CHLORIDE 0.9 % IV SOLN
10.0000 mg/kg | Freq: Once | INTRAVENOUS | Status: AC
  Administered 2022-04-26: 620 mg via INTRAVENOUS
  Filled 2022-04-26: qty 10

## 2022-04-26 MED ORDER — HEPARIN SOD (PORK) LOCK FLUSH 100 UNIT/ML IV SOLN
INTRAVENOUS | Status: AC
  Filled 2022-04-26: qty 5

## 2022-04-26 MED ORDER — SODIUM CHLORIDE 0.9 % IV SOLN
Freq: Once | INTRAVENOUS | Status: AC
  Filled 2022-04-26: qty 250

## 2022-04-26 NOTE — Telephone Encounter (Signed)
Requested medication (s) are due for refill today: no  Requested medication (s) are on the active medication list: no, lisinopril  Last refill:  04/03/22  Future visit scheduled: yes  Notes to clinic:  Unable to refill per protocol, cannot delegate flexeril. Lisinopril is not on current medication list, routing for review      Requested Prescriptions  Pending Prescriptions Disp Refills   cyclobenzaprine (FLEXERIL) 10 MG tablet [Pharmacy Med Name: CYCLOBENZAPRINE HYDROCHLORIDE 10 MG Tablet] 90 tablet 3    Sig: TAKE 1 TABLET AT BEDTIME     Not Delegated - Analgesics:  Muscle Relaxants Failed - 04/25/2022  1:19 PM      Failed - This refill cannot be delegated      Passed - Valid encounter within last 6 months    Recent Outpatient Visits           Yesterday Right foot pain   [place], [place], DO   3 weeks ago Encounter for [employer] annual wellness exam   [place], DO   2 months ago Essential hypertension   [place] P, DO   5 months ago Cutaneous abscess of neck   [place], PA-C   1 year ago LUQ pain   [place], [place], DO       Future Appointments             In 5 months [place], Megan P, DO [place], [facility]             lisinopril (ZESTRIL) 20 MG tablet [Pharmacy Med Name: LISINOPRIL 20 MG Tablet] 30 tablet 11    Sig: TAKE 1 TABLET EVERY DAY (NEED MD APPOINTMENT)     Cardiovascular:  ACE Inhibitors Failed - 04/25/2022  1:19 PM      Failed - Cr in normal range and within 180 days    Creatinine  Date Value Ref Range Status  05/14/2014 0.78 mg/dL Final    Comment:    0.44-1.00 NOTE: New Reference Range  04/07/14    Creatinine, Ser  Date Value Ref Range Status  04/26/2022 1.00 0.44 - 1.00 mg/dL Final         Failed - Last BP in normal  range    BP Readings from Last 1 Encounters:  04/26/22 (!) 131/58         Passed - K in normal range and within 180 days    Potassium  Date Value Ref Range Status  04/26/2022 3.9 3.5 - 5.1 mmol/L Final  05/14/2014 4.3 mmol/L Final    Comment:    3.5-5.1 NOTE: New Reference Range  04/07/14          Passed - Patient is not pregnant      Passed - Valid encounter within last 6 months    Recent Outpatient Visits           Yesterday Right foot pain   [place], Megan P, DO   3 weeks ago Encounter for [employer] annual wellness exam   [place], [place], DO   2 months ago Essential hypertension   [place], Megan P, DO   5 months ago Cutaneous abscess of neck   [place], Dani Gobble, PA-C   1 year  ago LUQ pain   [place], DO       Future Appointments             In 5 months Wynetta Emery, Barb Merino, DO [place], [facility]

## 2022-04-26 NOTE — Progress Notes (Signed)
Hematology/Oncology Progress note [phone] [phone]     CHIEF COMPLAINTS/REASON FOR VISIT:  Follow up lung cancer treatments   ASSESSMENT & PLAN:   Cancer Staging  Cervical cancer ([place]) Staging form: Cervix Uteri, AJCC Version 9 - Clinical: FIGO Stage IIICr (cTX, cN1) - Unsigned  Primary lung adenocarcinoma ([place]) Staging form: Lung, AJCC 7th Edition - Clinical stage from 01/12/2021: T1, N1, M0 - Signed by [name], MD on 01/29/2021   Primary lung adenocarcinoma ([place]) Stage III right lung adenocarcinoma,  Recurrent S/p concurrent chemotherapy and radiation- On immunotherapy durvalumab maintenance Labs reviewed and discussed with patient.  Proceed with Durvalumab treatments Repeat CT in April 2024.    Anemia in chronic kidney disease (CODE) Hemoglobin has improved.  Continue monitor.  Continue oral iron supplementation.   CKD (chronic kidney disease) stage 3, GFR 30-59 ml/min (HCC) Avoid nephrotoxins. Encourage oral hydration.    Tobacco use Recommend smoke cessation.    No orders of the defined types were placed in this encounter.  Follow up in 2 weeks.  Lab MD Durvalumab.  All questions were answered. The patient knows to call the clinic with any problems, questions or concerns.  [name], MD, PhD [facility] Hematology Oncology 04/26/2022      HISTORY OF PRESENTING ILLNESS:  # Cervix cancer Patient has developed postmenopausal vaginal bleeding and discharge.  She was noted to have a friable cervix/2 cm mass of the cervix, concerning for malignancy 12/19/2018 endometrial biopsy showed scattered atypical squamous cells suspicious for malignancy.  Predominantly necrosis with associated inflammation. 01/01/2019 initial cervix biopsy showed at least high-grade squamous intraepithelial lesion.-HPV negative 01/22/2019, repeat vaginal wall biopsy and cervix 9:00 biopsy showed squamous cell carcinoma.    Staging images 12/31/2018 showed  fluid distending the endometrial canal and or endometrial thickening to the level of cervix.  No evidence of pathological lymphadenopathy.  Haziness about the lower cervix.  7 mm irregular pulmonary nodule within the right upper lobe, suspicious for possible primary or metastatic malignancy.  Chronic pleural-parenchymal scarring/fibrosis at the lung apices.  Large amount of stool.  No bowel obstruction. 01/08/2019 PET scan showed marked hypermetabolism in the region of the cervix, compatible with the reported history of cervical cancer. Small bilateral pelvic sidewall lymph nodes discernible FDG accumulation, concerning for metastatic disease although neither lymph node is enlarged by CT size criteria. 7 mm nodule in the right upper lobe shows discernible FDG accumulation.  Questionable neoplasm, primary versus metastatic. Emphysema.   # 02/20/2020 - 03/19/2020 concurrent chemotherapy weekly carboplatin and taxol and radiation for treatment of this cervix cancer.   Oncology History  Primary lung adenocarcinoma ([place])  11/20/2020 Imaging   CT chest with contrast showed minimal residual of previously seen right upper lobe nodule. 0.6 x 0.4 cm. Internal development of mild bandlike radiation fibrosis. Newly enlarged pretracheal lymph node, measuring 2.5 x 1.7 cm. Highly concerning for metastatic disease. Unchanged prominent AP window and the right hilar lymph nodes. Emphysema and diffuse bilateral bronchial wall thickening. Coronary artery disease.    12/13/2020 Imaging   PET scan showed enlarging hypermetabolic right lower paratracheal lymph node, compatible with metastatic disease. SUV 10.3. Mild FDG uptake of the bilateral sacral alla with associated lucency. Concerning for sacral insufficiency fracture. Stable posttreatment changes of the right upper lobe nodule. Nonspecific small solid 3 mm pulmonary nodule of the left lower lobe. 2 small to be characterized. No evidence of FDG avid metastatic disease in  the abdomen or pelvis. Aortic atherosclerosis and emphysema.  01/12/2021 Initial Diagnosis   12/2019 lung nodules FDG avid, questionable primary bronchogenic carcinoma versus metastatic cancer.Discussed with radiation oncology. Her case was also discussed on tumor board. Consensus reached on finishing concurrent chemoradiation treatments for cervix cancer first. Possible SBRT to lung nodule for presumed primary lung cancer.  She was evaluated by Dr.Oaks. Options of biopsy via transthoracic or endobronchial approach vs surgical resection. Patient opted to empiric SBRT.  She lost follow up with me until Oct 2022   Primary lung adenocarcinoma [facility]) 11/28/22Patient re establish care with me  for lung cancer management.  01/12/2021 patient underwent bronchoscopy biopsy by Dr. Patsey Berthold Right paratracheal lymph node fine-needle aspiration is positive for metastatic non-small cell carcinoma, favor adenocarcinoma of lung origin.  Circulogen NGS negative PD-L1, ALK, ROS1, NTRK1/2/3,    01/12/2021 Cancer Staging   Staging form: Lung, AJCC 7th Edition - Clinical stage from 01/12/2021: T1, N1, M0 - Signed by [name], MD on 01/29/2021 Laterality: Right Stage used in treatment planning: Yes National guidelines used in treatment planning: Yes Type of national guideline used in treatment planning: NCCN   02/14/2021 - 03/28/2021 Chemotherapy   Concurrent chemotherapy and Radiation  LUNG Carboplatin / Paclitaxel + XRT q7d      04/26/2021 Imaging   CT chest with contrast showed evolving postradiation changes in the posterior perifissural upper right lung without discrete residual measurable nodule in this location. Right hilar/mediastinal lymphadenopathy stable to decreased. Tiny 0.2 cm left lower lobe pulmonary nodule slightly decreased. Positive response to treatment. No new or progressive disease. Three-vessel coronary arthrosclerosis. Aortic atherosclerosis/emphysema.    05/19/2021 -  Chemotherapy    Patient is on Treatment Plan : LUNG Durvalumab (10) q14d     07/25/2021 Imaging   CT showed 1. Questionable slight enlargement a small left lower lobe nodule measuring up to 5 mm of the sagittal images compared with the most recent prior study. Attention on follow-up in approximately 6 months recommended.2. Otherwise stable chest CT with stable radiation changes superiorly in the right hemithorax and small residual right hilar lymph node.3. Coronary and aortic atherosclerosis (ICD10-I70.0). Emphysema   10/26/2021 Imaging   CT chest abdomen pelvis w contrast 1. Bandlike consolidative changes in the posterior RIGHT upper lobe appear more organized at the periphery. No focal masslike characteristics. Findings are compatible with evolving post treatment changes. Findings are favored to represent evolving post treatment changes. 2. Nodule in the LEFT lower lobe a 4 mm may be very slowly increasing in size over time, certainly as compared to March of 2023. Continued attention on follow-up is suggested. 3. Stable 9 mm RIGHT hilar lymph node. 4. Evidence of sacral insufficiency fractures not substantially changed. 5. Pulmonary emphysema and aortic atherosclerosis.     01/31/2022 Imaging   CT chest w contrast 1. Nodular areas developing of bandlike post treatment changes are of uncertain significance. While potentially evolving post treatment changes increasingly nodular foci amidst expected consolidative changes a mildly suspicious. PET imaging or continued short interval follow-up could be considered. 2. New small irregular nodules in the RIGHT lower lobe measuring 3-4 mm with some indistinct adjacent septal thickening. These are nonspecific and could be infectious or inflammatory. Particularly given the presence of material in the RIGHT lower lobe bronchus. 3. Stable 4 mm LEFT lower lobe pulmonary nodule. 4. Three-vessel coronary artery calcification as on previous imaging quite extensive. 5. Emphysema  and aortic atherosclerosis.  Aortic Atherosclerosis and Emphysema   02/12/2022 Imaging   CT abdomen pelvis wo contrast Large stool burden throughout  the colon suggesting constipation. Aortoiliac atherosclerosis. No acute findings.   Cervical cancer ([place])  02/03/2019 Initial Diagnosis   Malignant neoplasm of cervix ([place])   02/20/2019 - 03/20/2019 Chemotherapy   Patient is on Treatment Plan : Carboplatin q7d w/ XRT     02/14/2021 - 03/28/2021 Chemotherapy   Concurrent chemotherapy and Radiation  LUNG Carboplatin / Paclitaxel + XRT q7d         INTERVAL HISTORY NANEA CRAIGE is a 77 y.o. female who has above history reviewed by me today presents for follow up visit for management of possible recurrent lung cancer.  History of cervix cancer.   She tolerates immunotherapy well.  She has no new complaints.  Weight is stable. Back pain is controlled with pain medication. Nausea has been better, she takes PRN home antiemetics.   Review of Systems  Constitutional:  Positive for fatigue. Negative for appetite change, chills, fever and unexpected weight change.  HENT:   Positive for hearing loss. Negative for voice change.   Eyes:  Negative for eye problems.  Respiratory:  Positive for cough. Negative for chest tightness.   Cardiovascular:  Negative for chest pain.  Gastrointestinal:  Negative for abdominal distention, abdominal pain, blood in stool and nausea.  Endocrine: Negative for hot flashes.  Genitourinary:  Negative for difficulty urinating and frequency.   Musculoskeletal:  Negative for arthralgias.  Skin:  Negative for itching and rash.  Neurological:  Negative for extremity weakness.  Hematological:  Negative for adenopathy.  Psychiatric/Behavioral:  Negative for confusion.     MEDICAL HISTORY:  Past Medical History:  Diagnosis Date   Arthritis    Cancer ([place])    Carotid atherosclerosis    Carotid bruit    Cervical cancer (HCC)    COPD (chronic obstructive pulmonary  disease) (HCC)    emphysema   Coronary artery disease    Family history of adverse reaction to anesthesia    paternal grandfather died during surgery-pt unaware what happened   GERD (gastroesophageal reflux disease)    Hyperlipidemia    Hypertension    Hyponatremia 02/27/2019   Hypothyroidism    Infected cat bite 1980s   was hospitalized   Irregular heartbeat    Medical history non-contributory    Peripheral vascular disease ([place])    Renal insufficiency    Tobacco use     SURGICAL HISTORY: Past Surgical History:  Procedure Laterality Date   CAROTID ENDARTERECTOMY Right Oct. 2015   Dr. Lucky Cowboy   CAROTID STENOSIS Left April 2016   carotid stenosis surgery   COLONOSCOPY WITH PROPOFOL N/A 11/08/2016   Procedure: COLONOSCOPY WITH PROPOFOL;  Surgeon: Jonathon Bellows, MD;  Location[facility]COPY;  Service: Gastroenterology;  Laterality: N/A;   ESOPHAGOGASTRODUODENOSCOPY (EGD) WITH PROPOFOL N/A 11/08/2016   Procedure: ESOPHAGOGASTRODUODENOSCOPY (EGD) WITH PROPOFOL;  Surgeon: Jonathon Bellows, MD;  Location: [facility] ENDOSCOPY;  Service: Gastroenterology;  Laterality: N/A;   FLEXIBLE BRONCHOSCOPY N/A 12/17/2014   Procedure: FLEXIBLE BRONCHOSCOPY;  Surgeon: Wilhelmina Mcardle, MD;  Location: [facility] ORS;  Service: Pulmonary;  Laterality: N/A;   INCISION AND DRAINAGE / EXCISION THYROGLOSSAL CYST  April 2011   PORTA CATH INSERTION N/A 03/03/2021   Procedure: PORTA CATH INSERTION;  Surgeon: Algernon Huxley, MD;  Location: [place] CV LAB;  Service: Cardiovascular;  Laterality: N/A;   VIDEO BRONCHOSCOPY WITH ENDOBRONCHIAL ULTRASOUND N/A 01/12/2021   Procedure: VIDEO BRONCHOSCOPY WITH ENDOBRONCHIAL ULTRASOUND;  Surgeon: Tyler Pita, MD;  Location: [facility] ORS;  Service: Cardiopulmonary;  Laterality: N/A;    SOCIAL  HISTORY: Social History   Socioeconomic History   Marital status: Married    Spouse name: Deidre Ala   Number of children: Not on file   Years of education: Not on file   Highest education level:  9th grade  Occupational History   Occupation: retired   Tobacco Use   Smoking status: Every Day    Packs/day: 0.50    Years: 50.00    Additional pack years: 0.00    Total pack years: 25.00    Types: Cigarettes   Smokeless tobacco: Never   Tobacco comments:    0.5 PPD 02/10/2021  Vaping Use   Vaping Use: Former  Substance and Sexual Activity   Alcohol use: No    Alcohol/week: 0.0 standard drinks of alcohol   Drug use: No   Sexual activity: Yes  Other Topics Concern   Not on file  Social History Narrative   Lives at home with husband   Social Determinants of Health   Financial Resource Strain: Low Risk  (03/15/2021)   Overall Financial Resource Strain (CARDIA)    Difficulty of Paying Living Expenses: Not hard at all  Food Insecurity: No Food Insecurity (03/15/2021)   Hunger Vital Sign    Worried About Running Out of Food in the Last Year: Never true    [place] in the Last Year: Never true  Transportation Needs: No Transportation Needs (03/15/2021)   PRAPARE - Hydrologist (Medical): No    Lack of Transportation (Non-Medical): No  Physical Activity: Inactive (03/15/2021)   Exercise Vital Sign    Days of Exercise per Week: 0 days    Minutes of Exercise per Session: 0 min  Stress: No Stress Concern Present (03/15/2021)   [place]    Feeling of Stress : Not at all  Social Connections: Moderately Isolated (03/15/2021)   Social Connection and Isolation Panel [NHANES]    Frequency of Communication with Friends and Family: More than three times a week    Frequency of Social Gatherings with Friends and Family: More than three times a week    Attends Religious Services: Never    Marine scientist or Organizations: No    Attends Archivist Meetings: Never    Marital Status: Married  Human resources officer Violence: Not At Risk (03/15/2021)   Humiliation, Afraid, Rape, and  Kick questionnaire    Fear of Current or Ex-Partner: No    Emotionally Abused: No    Physically Abused: No    Sexually Abused: No    FAMILY HISTORY: Family History  Problem Relation Age of Onset   Congestive Heart Failure Mother    Heart disease Mother    Hypertension Mother    Hypertension Sister    Diabetes Sister    Heart disease Sister    Heart disease Maternal Uncle    Heart disease Maternal Grandmother    Stroke Maternal Grandfather    Cancer Brother        liver, lung   Heart disease Brother    Hypertension Brother    Heart attack Brother    COPD Neg Hx    Breast cancer Neg Hx     ALLERGIES:  is allergic to atorvastatin.  MEDICATIONS:  Current Outpatient Medications  Medication Sig Dispense Refill   acetaminophen (TYLENOL) 500 MG tablet Take 500 mg by mouth every 6 (six) hours as needed for moderate pain.     albuterol (VENTOLIN  HFA) 108 (90 Base) MCG/ACT inhaler Inhale 2 puffs into the lungs every 6 (six) hours as needed for wheezing or shortness of breath. 8 g 6   alendronate (FOSAMAX) 70 MG tablet TAKE 1 TABLET EVERY 7 DAYS . TAKE WITH A FULL GLASS OF WATER ON AN EMPTY STOMACH. 12 tablet 3   clopidogrel (PLAVIX) 75 MG tablet Take 1 tablet (75 mg total) by mouth daily. 90 tablet 1   cyclobenzaprine (FLEXERIL) 10 MG tablet Take 1 tablet (10 mg total) by mouth at bedtime. 90 tablet 1   docusate sodium (COLACE) 100 MG capsule Take 1 capsule (100 mg total) by mouth daily. 30 capsule 4   Fluticasone-Umeclidin-Vilant (TRELEGY ELLIPTA) 100-62.5-25 MCG/ACT AEPB Inhale 1 puff into the lungs daily. 1 each 11   gabapentin (NEURONTIN) 100 MG capsule Take 1 capsule (100 mg total) by mouth 3 (three) times daily. 270 capsule 1   Iron-Vitamin C 65-125 MG TABS Take 1 tablet by mouth daily. 30 tablet 5   levothyroxine (SYNTHROID) 88 MCG tablet Take 1 tablet (88 mcg total) by mouth daily. 90 tablet 3   lidocaine (LIDODERM) 5 % Place 1 patch onto the skin daily. Remove & Discard patch  within 12 hours or as directed by MD 90 patch 1   lidocaine-prilocaine (EMLA) cream Apply 1 application topically as needed. 60 g 3   Multiple Vitamin (MULTIVITAMIN) tablet Take 1 tablet by mouth daily.     naproxen (NAPROSYN) 500 MG tablet Take 1 tablet (500 mg total) by mouth at bedtime. 30 tablet 3   omeprazole (PRILOSEC) 20 MG capsule Take 1 capsule (20 mg total) by mouth daily. 90 capsule 3   ondansetron (ZOFRAN) 8 MG tablet Take 1 tablet (8 mg total) by mouth every 8 (eight) hours as needed for refractory nausea / vomiting. Start on day 3 after chemo. 30 tablet 1   prochlorperazine (COMPAZINE) 10 MG tablet Take 1 tablet (10 mg total) by mouth every 6 (six) hours as needed for nausea or vomiting. 30 tablet 0   traMADol (ULTRAM) 50 MG tablet Take 1-2 tablets (50-100 mg total) by mouth every 12 (twelve) hours as needed. 180 tablet 0   vitamin B-12 (CYANOCOBALAMIN) 500 MCG tablet Take 500 mcg by mouth daily.     No current facility-administered medications for this visit.     PHYSICAL EXAMINATION: ECOG PERFORMANCE STATUS: 1 - Symptomatic but completely ambulatory Vitals:   04/26/22 1044  BP: (!) 131/58  Pulse: 95  Resp: 18  Temp: 97.6 F (36.4 C)  SpO2: 100%    Filed Weights   04/26/22 1044  Weight: 125 lb 11.2 oz (57 kg)     Physical Exam Constitutional:      General: She is not in acute distress. HENT:     Head: Normocephalic and atraumatic.  Eyes:     General: No scleral icterus. Cardiovascular:     Rate and Rhythm: Normal rate.  Pulmonary:     Effort: Pulmonary effort is normal. No respiratory distress.     Breath sounds: No wheezing.     Comments: Decreased breath sound bilaterally  Abdominal:     General: There is no distension.     Palpations: Abdomen is soft.  Musculoskeletal:        General: No deformity. Normal range of motion.     Cervical back: Normal range of motion and neck supple.     Comments: Trace edema bilaterally  Skin:    General: Skin is  warm and dry.  Neurological:     Mental Status: She is alert and oriented to person, place, and time. Mental status is at baseline.     Cranial Nerves: No cranial nerve deficit.  Psychiatric:        Mood and Affect: Mood normal.      LABORATORY DATA:  I have reviewed the data as listed    Latest Ref Rng & Units 04/26/2022    9:56 AM 04/12/2022   10:08 AM 04/03/2022    1:23 PM  CBC  WBC 4.0 - 10.5 K/uL 5.2  5.9  6.3   Hemoglobin 12.0 - 15.0 g/dL 11.1  9.7  11.0   Hematocrit 36.0 - 46.0 % 32.5  29.2  32.2   Platelets 150 - 400 K/uL 249  220  307       Latest Ref Rng & Units 04/26/2022    9:56 AM 04/25/2022    3:09 PM 04/12/2022   10:08 AM  CMP  Glucose 70 - 99 mg/dL 105  85  119   BUN 8 - 23 mg/dL 15  12  18    Creatinine 0.44 - 1.00 mg/dL 1.00  0.99  1.14   Sodium 135 - 145 mmol/L 130  135  132   Potassium 3.5 - 5.1 mmol/L 3.9  4.6  3.9   Chloride 98 - 111 mmol/L 101  97  102   CO2 22 - 32 mmol/L 22  17  22    Calcium 8.9 - 10.3 mg/dL 9.2  9.7  9.0   Total Protein 6.5 - 8.1 g/dL 7.1   7.0   Total Bilirubin 0.3 - 1.2 mg/dL 0.4   0.3   Alkaline Phos 38 - 126 U/L 72   76   AST 15 - 41 U/L 21   28   ALT 0 - 44 U/L 12   18      Iron/TIBC/Ferritin/ %Sat    Component Value Date/Time   IRON 64 03/01/2022 1007   TIBC 237 (L) 03/01/2022 1007   FERRITIN 90 03/01/2022 1007   IRONPCTSAT 27 03/01/2022 1007      RADIOGRAPHIC STUDIES: I have personally reviewed the radiological images as listed and agreed with the findings in the report. CT ABDOMEN PELVIS W CONTRAST  Result Date: 02/12/2022 CLINICAL DATA:  Abdominal pain, acute, nonlocalized EXAM: CT ABDOMEN AND PELVIS WITH CONTRAST TECHNIQUE: Multidetector CT imaging of the abdomen and pelvis was performed using the standard protocol following bolus administration of intravenous contrast. RADIATION DOSE REDUCTION: This exam was performed according to the departmental dose-optimization program which includes automated exposure  control, adjustment of the mA and/or kV according to patient size and/or use of iterative reconstruction technique. CONTRAST:  65mL OMNIPAQUE IOHEXOL 300 MG/ML  SOLN COMPARISON:  10/25/2021 FINDINGS: Lower chest: No acute abnormality Hepatobiliary: No focal hepatic abnormality. Gallbladder unremarkable. Pancreas: No focal abnormality or ductal dilatation. Spleen: No focal abnormality.  Normal size. Adrenals/Urinary Tract: No adrenal abnormality. No focal renal abnormality. No stones or hydronephrosis. Urinary bladder is unremarkable. Stomach/Bowel: Large stool burden throughout the colon suggesting constipation. Stomach and small bowel decompressed. Vascular/Lymphatic: Diffuse aortoiliac atherosclerosis. No evidence of aneurysm or adenopathy. Reproductive: Uterus and adnexa unremarkable.  No mass. Other: No free fluid or free air. Musculoskeletal: No acute bony abnormality. IMPRESSION: Large stool burden throughout the colon suggesting constipation. Aortoiliac atherosclerosis. No acute findings. Electronically Signed   By: Rolm Baptise M.D.   On: 02/12/2022 18:06   CT Chest W Contrast  Result Date: 02/01/2022 CLINICAL DATA:  77 year old female presents for follow-up of lung cancer. * Tracking Code: BO * EXAM: CT CHEST WITH CONTRAST TECHNIQUE: Multidetector CT imaging of the chest was performed during intravenous contrast administration. RADIATION DOSE REDUCTION: This exam was performed according to the departmental dose-optimization program which includes automated exposure control, adjustment of the mA and/or kV according to patient size and/or use of iterative reconstruction technique. CONTRAST:  56mL OMNIPAQUE IOHEXOL 300 MG/ML  SOLN COMPARISON:  October 25, 2021 FINDINGS: Cardiovascular: Calcified and noncalcified aortic atherosclerotic plaque. Normal heart size. Three-vessel coronary artery calcification as on previous imaging quite extensive. LEFT-sided IJ Port-A-Cath terminates in the mid RIGHT atrium.  Central pulmonary vasculature is unremarkable on venous phase. Mediastinum/Nodes: Mild generalized thickening of the esophagus is stable and likely post treatment related. RIGHT hilar lymph node (image 65/2) 8 mm, unchanged. No new signs of adenopathy in the chest. Lungs/Pleura: Pulmonary emphysema as on previous imaging. Material in the dependent aspect of the bronchus intermedius in the RIGHT chest. Bandlike consolidative changes extend from the RIGHT hilum into the peripheral RIGHT chest. Associated septal thickening and bandlike changes are more pronounced than on previous imaging. Nodular area amidst bandlike changes at the site of previous nodule measuring 7 mm greatest craniocaudal extent previously greatest area of more bandlike and non nodular change at 5 mm greatest thickness, another area on image 78/5 measuring 7 mm as well. More peripheral area of nodularity (image 67/5 appears bandlike with some thickening medially. LEFT lower lobe pulmonary nodule stable at 4 mm (image 101/3) New small irregular nodules in the RIGHT lower lobe (image 91/3 and image 88/3 measuring 3-4 mm with some indistinct adjacent septal thickening. Mild apical septal thickening generalized throughout the LEFT and RIGHT upper lobes greatest on the RIGHT similar to previous imaging. Upper Abdomen: Incidental imaging of upper abdominal contents without acute process. Musculoskeletal: No chest wall mass.  No acute bone finding. IMPRESSION: 1. Nodular areas developing of bandlike post treatment changes are of uncertain significance. While potentially evolving post treatment changes increasingly nodular foci amidst expected consolidative changes a mildly suspicious. PET imaging or continued short interval follow-up could be considered. 2. New small irregular nodules in the RIGHT lower lobe measuring 3-4 mm with some indistinct adjacent septal thickening. These are nonspecific and could be infectious or inflammatory. Particularly given  the presence of material in the RIGHT lower lobe bronchus. 3. Stable 4 mm LEFT lower lobe pulmonary nodule. 4. Three-vessel coronary artery calcification as on previous imaging quite extensive. 5. Emphysema and aortic atherosclerosis. Aortic Atherosclerosis (ICD10-I70.0) and Emphysema (ICD10-J43.9). Electronically Signed   By: Zetta Bills M.D.   On: 02/01/2022 14:57

## 2022-04-26 NOTE — Assessment & Plan Note (Signed)
Avoid nephrotoxins. Encourage oral hydration.  

## 2022-04-26 NOTE — Assessment & Plan Note (Signed)
Recommend smoke cessation.  

## 2022-04-26 NOTE — Assessment & Plan Note (Addendum)
Hemoglobin has improved.  Continue monitor.  Continue oral iron supplementation.

## 2022-04-26 NOTE — Assessment & Plan Note (Signed)
Stage III right lung adenocarcinoma,  Recurrent S/p concurrent chemotherapy and radiation- On immunotherapy durvalumab maintenance Labs reviewed and discussed with patient.  Proceed with Durvalumab treatments Repeat CT in April 2024.   

## 2022-04-26 NOTE — Patient Instructions (Signed)
[facility]  Discharge Instructions: Thank you for choosing [facility] to provide your oncology and hematology care.  If you have a lab appointment with the Cancer Center, please go directly to the Cancer Center and check in at the registration area.  Wear comfortable clothing and clothing appropriate for easy access to any Portacath or PICC line.   We strive to give you quality time with your provider. You may need to reschedule your appointment if you arrive late (15 or more minutes).  Arriving late affects you and other patients whose appointments are after yours.  Also, if you miss three or more appointments without notifying the office, you may be dismissed from the clinic at the provider's discretion.      For prescription refill requests, have your pharmacy contact our office and allow 72 hours for refills to be completed.    Today you received the following chemotherapy and/or immunotherapy agents Imfinzi        To help prevent nausea and vomiting after your treatment, we encourage you to take your nausea medication as directed.  BELOW ARE SYMPTOMS THAT SHOULD BE REPORTED IMMEDIATELY: *FEVER GREATER THAN 100.4 F (38 C) OR HIGHER *CHILLS OR SWEATING *NAUSEA AND VOMITING THAT IS NOT CONTROLLED WITH YOUR NAUSEA MEDICATION *UNUSUAL SHORTNESS OF BREATH *UNUSUAL BRUISING OR BLEEDING *URINARY PROBLEMS (pain or burning when urinating, or frequent urination) *BOWEL PROBLEMS (unusual diarrhea, constipation, pain near the anus) TENDERNESS IN MOUTH AND THROAT WITH OR WITHOUT PRESENCE OF ULCERS (sore throat, sores in mouth, or a toothache) UNUSUAL RASH, SWELLING OR PAIN  UNUSUAL VAGINAL DISCHARGE OR ITCHING   Items with * indicate a potential emergency and should be followed up as soon as possible or go to the Emergency Department if any problems should occur.  Please show the CHEMOTHERAPY ALERT CARD or IMMUNOTHERAPY ALERT CARD at check-in to  the Emergency Department and triage nurse.  Should you have questions after your visit or need to cancel or reschedule your appointment, please contact [facility]  [phone] and follow the prompts.  Office hours are 8:00 a.m. to 4:30 p.m. Monday - Friday. Please note that voicemails left after 4:00 p.m. may not be returned until the following business day.  We are closed weekends and major holidays. You have access to a nurse at all times for urgent questions. Please call the main number to the clinic [phone] and follow the prompts.  For any non-urgent questions, you may also contact your provider using MyChart. We now offer e-Visits for anyone 18 and older to request care online for non-urgent symptoms. For details visit [email or web address].   Also download the MyChart app! Go to the app store, search "MyChart", open the app, select [facility], and log in with your MyChart username and password.    

## 2022-04-28 ENCOUNTER — Other Ambulatory Visit: Payer: Self-pay | Admitting: Family Medicine

## 2022-04-28 DIAGNOSIS — S92354A Nondisplaced fracture of fifth metatarsal bone, right foot, initial encounter for closed fracture: Secondary | ICD-10-CM

## 2022-05-01 ENCOUNTER — Ambulatory Visit
Admission: RE | Admit: 2022-05-01 | Discharge: 2022-05-01 | Disposition: A | Discharge status: Home / Self Care | Payer: Medicare PPO | Source: Ambulatory Visit | Attending: Radiation Oncology | Admitting: Radiation Oncology

## 2022-05-01 DIAGNOSIS — R911 Solitary pulmonary nodule: Secondary | ICD-10-CM | POA: Diagnosis not present

## 2022-05-01 DIAGNOSIS — J439 Emphysema, unspecified: Secondary | ICD-10-CM | POA: Diagnosis not present

## 2022-05-01 MED ORDER — IOHEXOL 300 MG/ML  SOLN
75.0000 mL | Freq: Once | INTRAMUSCULAR | Status: AC | PRN
  Administered 2022-05-01: 75 mL via INTRAVENOUS

## 2022-05-02 ENCOUNTER — Other Ambulatory Visit: Payer: Self-pay

## 2022-05-02 ENCOUNTER — Other Ambulatory Visit: Payer: Self-pay | Admitting: *Deleted

## 2022-05-02 MED ORDER — TRAMADOL HCL 50 MG PO TABS: 50.0000 mg | ORAL_TABLET | Freq: Two times a day (BID) | ORAL | 0 refills | Status: DC | PRN

## 2022-05-03 ENCOUNTER — Ambulatory Visit: Payer: Medicare PPO | Admitting: Podiatry

## 2022-05-03 DIAGNOSIS — M6788 Other specified disorders of synovium and tendon, other site: Secondary | ICD-10-CM

## 2022-05-03 DIAGNOSIS — G609 Hereditary and idiopathic neuropathy, unspecified: Secondary | ICD-10-CM

## 2022-05-03 NOTE — Patient Instructions (Signed)

## 2022-05-05 ENCOUNTER — Other Ambulatory Visit: Payer: Self-pay

## 2022-05-08 ENCOUNTER — Encounter: Payer: Self-pay | Admitting: Radiation Oncology

## 2022-05-08 ENCOUNTER — Ambulatory Visit: Payer: Medicare PPO | Admitting: Radiation Oncology

## 2022-05-08 ENCOUNTER — Encounter: Payer: Self-pay | Admitting: Podiatry

## 2022-05-08 ENCOUNTER — Ambulatory Visit
Admission: RE | Admit: 2022-05-08 | Discharge: 2022-05-08 | Disposition: A | Discharge status: Home / Self Care | Payer: Medicare PPO | Source: Ambulatory Visit | Attending: Radiation Oncology | Admitting: Radiation Oncology

## 2022-05-08 VITALS — BP 139/72 | HR 102 | Temp 98.3°F | Resp 20 | Ht 68.0 in | Wt 127.6 lb

## 2022-05-08 DIAGNOSIS — C770 Secondary and unspecified malignant neoplasm of lymph nodes of head, face and neck: Secondary | ICD-10-CM | POA: Diagnosis not present

## 2022-05-08 DIAGNOSIS — Z8541 Personal history of malignant neoplasm of cervix uteri: Secondary | ICD-10-CM | POA: Diagnosis not present

## 2022-05-08 DIAGNOSIS — C3411 Malignant neoplasm of upper lobe, right bronchus or lung: Secondary | ICD-10-CM | POA: Diagnosis not present

## 2022-05-08 DIAGNOSIS — Z923 Personal history of irradiation: Secondary | ICD-10-CM | POA: Insufficient documentation

## 2022-05-08 DIAGNOSIS — R059 Cough, unspecified: Secondary | ICD-10-CM | POA: Diagnosis not present

## 2022-05-08 DIAGNOSIS — C3490 Malignant neoplasm of unspecified part of unspecified bronchus or lung: Secondary | ICD-10-CM | POA: Diagnosis not present

## 2022-05-08 NOTE — Progress Notes (Signed)
  Subjective:  Patient ID: Mary Hoover, female    DOB: 1945-09-24,  MRN: [id number]  Chief Complaint  Patient presents with   Fracture    Closed nondisplaced fracture of fifth metatarsal bone of right foot, initial encounter - xrays done in ED on 04/25/22    77 y.o. female presents with the above complaint. History confirmed with patient.  Does not recall any traumatic event that started this.  Also experiencing burning and tingling pains  Objective:  Physical Exam: warm, good capillary refill, no trophic changes or ulcerative lesions, normal DP and PT pulses, normal sensory exam, and pain and tenderness in right fifth metatarsal base at insertion of peroneal tendon     Radiographs: Multiple views x-ray of the right foot:  Radiographs taken 04/28/2022 show possible small avulsion versus enthesopathy at fifth metatarsal base at peroneal tendon insertion Assessment:   1. Peroneal tendinosis, right   2. Idiopathic neuropathy      Plan:  Patient was evaluated and treated and all questions answered.   Discussed the etiology and treatment options for peroneal tendinitis including stretching, formal physical therapy with an eccentric exercises therapy plan, supportive shoegears such as a running shoe or sneaker, bracing, topical and oral medications.  We also discussed that I do not routinely perform injections in this area because of the risk of an increased damage or rupture of the tendon.  We also discussed the role of surgical treatment of this for patients who do not improve after exhausting non-surgical treatment options.  -XR reviewed with patient -Educated on stretching and icing of the affected limb. -Home therapy plan given -Recommended Voltaren gel 3-4 times daily   We discussed etiology of options of peripheral neuropathy.  She has been started on gabapentin and she will work on using this to see if it alleviates the symptoms.  Return in about 1 month (around 06/02/2022)  for re-check peroneal tendinitis.

## 2022-05-09 NOTE — Progress Notes (Signed)
Radiation Oncology Follow up Note  Name: Mary Hoover   Date:   05/08/2022 MRN:  [id number] DOB: [birth year]    This 77 y.o. female presents to the clinic today for 1 year follow-up status post concurrent chemoradiation therapy for stage III adenocarcinoma the right lung and patient previously treated for cervical cancer and had SBRT to her left lung.Marland Kitchen  REFERRING PROVIDER: Dorcas Carrow, DO  HPI: Patient is a 77 year old female now out 1 year having completed concurrent chemoradiation therapy for stage III adenocarcinoma.  She is also a previous SBRT to her left lung as well as been treated for cervical cancer.  Seen today in follow-up she is doing fairly well.  Her's pulmonary status is stable.  She has a mild cough no hemoptysis or marked increase in shortness of breath..  She is currently on durvalumab maintenance which she is tolerating well.  She had a recent CT scan which I have reviewed showed radiation changes in the right lung small thoracic lymph nodes mildly progressive with suggestion for follow-up.  COMPLICATIONS OF TREATMENT: none  FOLLOW UP COMPLIANCE: keeps appointments   PHYSICAL EXAM:  BP 139/72   Pulse (!) 102   Temp 98.3 F (36.8 C) (Tympanic)   Resp 20   Ht 5\' 8"  (1.727 m) Comment: stated HT  Wt 127 lb 9.6 oz (57.9 kg)   BMI 19.40 kg/m  Well-developed well-nourished patient in NAD. HEENT reveals PERLA, EOMI, discs not visualized.  Oral cavity is clear. No oral mucosal lesions are identified. Neck is clear without evidence of cervical or supraclavicular adenopathy. Lungs are clear to A&P. Cardiac examination is essentially unremarkable with regular rate and rhythm without murmur rub or thrill. Abdomen is benign with no organomegaly or masses noted. Motor sensory and DTR levels are equal and symmetric in the upper and lower extremities. Cranial nerves II through XII are grossly intact. Proprioception is intact. No peripheral adenopathy or edema is identified. No  motor or sensory levels are noted. Crude visual fields are within normal range.  RADIOLOGY RESULTS: CT scans reviewed compatible with above-stated findings  PLAN: Present time of asked to see her back in 6 months with a follow-up CT scan at that time.  She continues immunotherapy maintenance under medical oncology's direction.  I am otherwise pleased with her overall progress.  Of asked to see her back in 6 months as stated above.  Patient is to call sooner with any concerns.  I would like to take this opportunity to thank you for allowing me to participate in the care of your patient.Carmina Miller, MD

## 2022-05-10 ENCOUNTER — Inpatient Hospital Stay: Payer: Medicare PPO

## 2022-05-10 ENCOUNTER — Inpatient Hospital Stay (HOSPITAL_BASED_OUTPATIENT_CLINIC_OR_DEPARTMENT_OTHER): Payer: Medicare PPO | Admitting: Oncology

## 2022-05-10 ENCOUNTER — Encounter: Payer: Self-pay | Admitting: Oncology

## 2022-05-10 ENCOUNTER — Inpatient Hospital Stay: Payer: Medicare PPO | Attending: Oncology

## 2022-05-10 VITALS — BP 152/84 | HR 83 | Temp 97.5°F | Resp 18 | Wt 128.9 lb

## 2022-05-10 DIAGNOSIS — C539 Malignant neoplasm of cervix uteri, unspecified: Secondary | ICD-10-CM | POA: Diagnosis not present

## 2022-05-10 DIAGNOSIS — Z5112 Encounter for antineoplastic immunotherapy: Secondary | ICD-10-CM | POA: Diagnosis not present

## 2022-05-10 DIAGNOSIS — C3491 Malignant neoplasm of unspecified part of right bronchus or lung: Secondary | ICD-10-CM

## 2022-05-10 DIAGNOSIS — Z720 Tobacco use: Secondary | ICD-10-CM | POA: Diagnosis not present

## 2022-05-10 DIAGNOSIS — Z79899 Other long term (current) drug therapy: Secondary | ICD-10-CM | POA: Diagnosis not present

## 2022-05-10 DIAGNOSIS — I129 Hypertensive chronic kidney disease with stage 1 through stage 4 chronic kidney disease, or unspecified chronic kidney disease: Secondary | ICD-10-CM | POA: Diagnosis not present

## 2022-05-10 DIAGNOSIS — E871 Hypo-osmolality and hyponatremia: Secondary | ICD-10-CM

## 2022-05-10 DIAGNOSIS — F17210 Nicotine dependence, cigarettes, uncomplicated: Secondary | ICD-10-CM | POA: Diagnosis not present

## 2022-05-10 DIAGNOSIS — D631 Anemia in chronic kidney disease: Secondary | ICD-10-CM | POA: Insufficient documentation

## 2022-05-10 DIAGNOSIS — N183 Chronic kidney disease, stage 3 unspecified: Secondary | ICD-10-CM | POA: Insufficient documentation

## 2022-05-10 LAB — CBC WITH DIFFERENTIAL/PLATELET
Abs Immature Granulocytes: 0.03 10*3/uL (ref 0.00–0.07)
Basophils Absolute: 0 10*3/uL (ref 0.0–0.1)
Basophils Relative: 0 %
Eosinophils Absolute: 0.1 10*3/uL (ref 0.0–0.5)
Eosinophils Relative: 2 %
HCT: 31.3 % — ABNORMAL LOW (ref 36.0–46.0)
Hemoglobin: 10.9 g/dL — ABNORMAL LOW (ref 12.0–15.0)
Immature Granulocytes: 0 %
Lymphocytes Relative: 9 %
Lymphs Abs: 0.6 10*3/uL — ABNORMAL LOW (ref 0.7–4.0)
MCH: 32.2 pg (ref 26.0–34.0)
MCHC: 34.8 g/dL (ref 30.0–36.0)
MCV: 92.3 fL (ref 80.0–100.0)
Monocytes Absolute: 0.6 10*3/uL (ref 0.1–1.0)
Monocytes Relative: 9 %
Neutro Abs: 5.6 10*3/uL (ref 1.7–7.7)
Neutrophils Relative %: 80 %
Platelets: 259 10*3/uL (ref 150–400)
RBC: 3.39 MIL/uL — ABNORMAL LOW (ref 3.87–5.11)
RDW: 13.6 % (ref 11.5–15.5)
WBC: 7 10*3/uL (ref 4.0–10.5)
nRBC: 0 % (ref 0.0–0.2)

## 2022-05-10 LAB — COMPREHENSIVE METABOLIC PANEL
ALT: 11 U/L (ref 0–44)
AST: 21 U/L (ref 15–41)
Albumin: 3.7 g/dL (ref 3.5–5.0)
Alkaline Phosphatase: 73 U/L (ref 38–126)
Anion gap: 6 (ref 5–15)
BUN: 18 mg/dL (ref 8–23)
CO2: 21 mmol/L — ABNORMAL LOW (ref 22–32)
Calcium: 8.5 mg/dL — ABNORMAL LOW (ref 8.9–10.3)
Chloride: 100 mmol/L (ref 98–111)
Creatinine, Ser: 1.12 mg/dL — ABNORMAL HIGH (ref 0.44–1.00)
GFR, Estimated: 51 mL/min — ABNORMAL LOW (ref >60)
Glucose, Bld: 103 mg/dL — ABNORMAL HIGH (ref 70–99)
Potassium: 4.1 mmol/L (ref 3.5–5.1)
Sodium: 127 mmol/L — ABNORMAL LOW (ref 135–145)
Total Bilirubin: 0.5 mg/dL (ref 0.3–1.2)
Total Protein: 7 g/dL (ref 6.5–8.1)

## 2022-05-10 LAB — OSMOLALITY, URINE: Osmolality, Ur: 243 mOsm/kg — ABNORMAL LOW (ref 300–900)

## 2022-05-10 LAB — SODIUM, URINE, RANDOM: Sodium, Ur: 65 mmol/L

## 2022-05-10 LAB — OSMOLALITY: Osmolality: 274 mOsm/kg — ABNORMAL LOW (ref 275–295)

## 2022-05-10 MED ORDER — HEPARIN SOD (PORK) LOCK FLUSH 100 UNIT/ML IV SOLN
500.0000 [IU] | Freq: Once | INTRAVENOUS | Status: AC | PRN
  Administered 2022-05-10: 500 [IU]
  Filled 2022-05-10: qty 5

## 2022-05-10 MED ORDER — SODIUM CHLORIDE 0.9 % IV SOLN
Freq: Once | INTRAVENOUS | Status: AC
  Filled 2022-05-10: qty 250

## 2022-05-10 MED ORDER — SODIUM CHLORIDE 0.9 % IV SOLN
10.0000 mg/kg | Freq: Once | INTRAVENOUS | Status: AC
  Administered 2022-05-10: 620 mg via INTRAVENOUS
  Filled 2022-05-10: qty 2.4

## 2022-05-10 NOTE — Assessment & Plan Note (Signed)
Locally advanced cervical cancer stage IIIC1 with pelvic nodes involvement. S/p concurrent chemotherapy and radiation followed by brachytherapy.  She is not actively following up with gynecology oncology.   

## 2022-05-10 NOTE — Progress Notes (Signed)
Hematology/Oncology Progress note [phone] [phone]     CHIEF COMPLAINTS/REASON FOR VISIT:  Follow up lung cancer treatments   ASSESSMENT & PLAN:   Cancer Staging  Cervical cancer Staging form: Cervix Uteri, AJCC Version 9 - Clinical: FIGO Stage IIICr (cTX, cN1) - Unsigned  Primary lung adenocarcinoma Staging form: Lung, AJCC 7th Edition - Clinical stage from 01/12/2021: T1, N1, M0 - Signed by [name], MD on 01/29/2021   Primary lung adenocarcinoma (HCC) Stage III right lung adenocarcinoma,  Recurrent S/p concurrent chemotherapy and radiation- On immunotherapy durvalumab maintenance Labs reviewed and discussed with patient.  Proceed with Durvalumab treatments Repeat CT in April 2024.- slight progression of right hilar node and right azygoesophageal recess node, close monitor, repeat CT in 2-3  months.     Tobacco use Recommend smoke cessation.  Cervical cancer (HCC) Locally advanced cervical cancer stage IIIC1 with pelvic nodes involvement. S/p concurrent chemotherapy and radiation followed by brachytherapy.  She is not actively following up with gynecology oncology.    Hyponatremia Serum osm 274 Urine osm 243 Urine sodium 65 She get IVF for hydration .  Check morning cortisol.   Encounter for antineoplastic immunotherapy Immunotherapy plan as listed above    Orders Placed This Encounter  Procedures   T4, free    Standing Status:   Future    Standing Expiration Date:   06/07/2023   CBC with Differential    Standing Status:   Future    Standing Expiration Date:   06/07/2023   Comprehensive metabolic panel    Standing Status:   Future    Standing Expiration Date:   06/07/2023   TSH    Standing Status:   Future    Standing Expiration Date:   06/07/2023   Osmolality, urine    Standing Status:   Future    Number of Occurrences:   1    Standing Expiration Date:   05/10/2023   Osmolality    Standing Status:   Future    Number of  Occurrences:   1    Standing Expiration Date:   05/10/2023   Sodium, urine, random    Standing Status:   Future    Number of Occurrences:   1    Standing Expiration Date:   05/10/2023   Basic Metabolic Panel - Cancer Center Only    Standing Status:   Future    Standing Expiration Date:   05/10/2023   Cortisol    Standing Status:   Future    Standing Expiration Date:   05/10/2023   ACTH    Standing Status:   Future    Standing Expiration Date:   05/10/2023   TSH    Standing Status:   Future    Standing Expiration Date:   05/10/2023    Follow up in 2 weeks.  Lab MD Durvalumab.  All questions were answered. The patient knows to call the clinic with any problems, questions or concerns.  [name], MD, PhD [facility] Hematology Oncology 05/10/2022      HISTORY OF PRESENTING ILLNESS:  # Cervix cancer Patient has developed postmenopausal vaginal bleeding and discharge.  She was noted to have a friable cervix/2 cm mass of the cervix, concerning for malignancy 12/19/2018 endometrial biopsy showed scattered atypical squamous cells suspicious for malignancy.  Predominantly necrosis with associated inflammation. 01/01/2019 initial cervix biopsy showed at least high-grade squamous intraepithelial lesion.-HPV negative 01/22/2019, repeat vaginal wall biopsy and cervix 9:00 biopsy showed squamous cell carcinoma.  Staging images 12/31/2018 showed fluid distending the endometrial canal and or endometrial thickening to the level of cervix.  No evidence of pathological lymphadenopathy.  Haziness about the lower cervix.  7 mm irregular pulmonary nodule within the right upper lobe, suspicious for possible primary or metastatic malignancy.  Chronic pleural-parenchymal scarring/fibrosis at the lung apices.  Large amount of stool.  No bowel obstruction. 01/08/2019 PET scan showed marked hypermetabolism in the region of the cervix, compatible with the reported history of cervical cancer. Small bilateral  pelvic sidewall lymph nodes discernible FDG accumulation, concerning for metastatic disease although neither lymph node is enlarged by CT size criteria. 7 mm nodule in the right upper lobe shows discernible FDG accumulation.  Questionable neoplasm, primary versus metastatic. Emphysema.   # 02/20/2020 - 03/19/2020 concurrent chemotherapy weekly carboplatin and taxol and radiation for treatment of this cervix cancer.   Oncology History  Primary lung adenocarcinoma  11/20/2020 Imaging   CT chest with contrast showed minimal residual of previously seen right upper lobe nodule. 0.6 x 0.4 cm. Internal development of mild bandlike radiation fibrosis. Newly enlarged pretracheal lymph node, measuring 2.5 x 1.7 cm. Highly concerning for metastatic disease. Unchanged prominent AP window and the right hilar lymph nodes. Emphysema and diffuse bilateral bronchial wall thickening. Coronary artery disease.    12/13/2020 Imaging   PET scan showed enlarging hypermetabolic right lower paratracheal lymph node, compatible with metastatic disease. SUV 10.3. Mild FDG uptake of the bilateral sacral alla with associated lucency. Concerning for sacral insufficiency fracture. Stable posttreatment changes of the right upper lobe nodule. Nonspecific small solid 3 mm pulmonary nodule of the left lower lobe. 2 small to be characterized. No evidence of FDG avid metastatic disease in the abdomen or pelvis. Aortic atherosclerosis and emphysema.    01/12/2021 Initial Diagnosis   12/2019 lung nodules FDG avid, questionable primary bronchogenic carcinoma versus metastatic cancer.Discussed with radiation oncology. Her case was also discussed on tumor board. Consensus reached on finishing concurrent chemoradiation treatments for cervix cancer first. Possible SBRT to lung nodule for presumed primary lung cancer.  She was evaluated by Dr.Oaks. Options of biopsy via transthoracic or endobronchial approach vs surgical resection. Patient opted  to empiric SBRT.  She lost follow up with me until Oct 2022   Primary lung adenocarcinoma [facility]) 11/28/22Patient re establish care with me  for lung cancer management.  01/12/2021 patient underwent bronchoscopy biopsy by Dr. Jayme Cloud Right paratracheal lymph node fine-needle aspiration is positive for metastatic non-small cell carcinoma, favor adenocarcinoma of lung origin.  Circulogen NGS negative PD-L1, ALK, ROS1, NTRK1/2/3,    01/12/2021 Cancer Staging   Staging form: Lung, AJCC 7th Edition - Clinical stage from 01/12/2021: T1, N1, M0 - Signed by [name], MD on 01/29/2021 Laterality: Right Stage used in treatment planning: Yes National guidelines used in treatment planning: Yes Type of national guideline used in treatment planning: NCCN   02/14/2021 - 03/28/2021 Chemotherapy   Concurrent chemotherapy and Radiation  LUNG Carboplatin / Paclitaxel + XRT q7d      04/26/2021 Imaging   CT chest with contrast showed evolving postradiation changes in the posterior perifissural upper right lung without discrete residual measurable nodule in this location. Right hilar/mediastinal lymphadenopathy stable to decreased. Tiny 0.2 cm left lower lobe pulmonary nodule slightly decreased. Positive response to treatment. No new or progressive disease. Three-vessel coronary arthrosclerosis. Aortic atherosclerosis/emphysema.    05/19/2021 -  Chemotherapy   Patient is on Treatment Plan : LUNG Durvalumab (10) q14d     07/25/2021 Imaging  CT showed 1. Questionable slight enlargement a small left lower lobe nodule measuring up to 5 mm of the sagittal images compared with the most recent prior study. Attention on follow-up in approximately 6 months recommended.2. Otherwise stable chest CT with stable radiation changes superiorly in the right hemithorax and small residual right hilar lymph node.3. Coronary and aortic atherosclerosis (ICD10-I70.0). Emphysema   10/26/2021 Imaging   CT chest abdomen pelvis w  contrast 1. Bandlike consolidative changes in the posterior RIGHT upper lobe appear more organized at the periphery. No focal masslike characteristics. Findings are compatible with evolving post treatment changes. Findings are favored to represent evolving post treatment changes. 2. Nodule in the LEFT lower lobe a 4 mm may be very slowly increasing in size over time, certainly as compared to March of 2023. Continued attention on follow-up is suggested. 3. Stable 9 mm RIGHT hilar lymph node. 4. Evidence of sacral insufficiency fractures not substantially changed. 5. Pulmonary emphysema and aortic atherosclerosis.     01/31/2022 Imaging   CT chest w contrast 1. Nodular areas developing of bandlike post treatment changes are of uncertain significance. While potentially evolving post treatment changes increasingly nodular foci amidst expected consolidative changes a mildly suspicious. PET imaging or continued short interval follow-up could be considered. 2. New small irregular nodules in the RIGHT lower lobe measuring 3-4 mm with some indistinct adjacent septal thickening. These are nonspecific and could be infectious or inflammatory. Particularly given the presence of material in the RIGHT lower lobe bronchus. 3. Stable 4 mm LEFT lower lobe pulmonary nodule. 4. Three-vessel coronary artery calcification as on previous imaging quite extensive. 5. Emphysema and aortic atherosclerosis.  Aortic Atherosclerosis and Emphysema   02/12/2022 Imaging   CT abdomen pelvis wo contrast Large stool burden throughout the colon suggesting constipation. Aortoiliac atherosclerosis. No acute findings.   Cervical cancer  02/03/2019 Initial Diagnosis   Malignant neoplasm of cervix (HCC)   02/20/2019 - 03/20/2019 Chemotherapy   Patient is on Treatment Plan : Carboplatin q7d w/ XRT     02/14/2021 - 03/28/2021 Chemotherapy   Concurrent chemotherapy and Radiation  LUNG Carboplatin / Paclitaxel + XRT q7d          INTERVAL HISTORY ANTOINE FIALLOS is a 77 y.o. female who has above history reviewed by me today presents for follow up visit for management of possible recurrent lung cancer.  History of cervix cancer.   She tolerates immunotherapy well.  She has no new complaints.  Weight is stable. Back pain is controlled with pain medication. Nausea has been better, she takes PRN home antiemetics.   Review of Systems  Constitutional:  Positive for fatigue. Negative for appetite change, chills, fever and unexpected weight change.  HENT:   Positive for hearing loss. Negative for voice change.   Eyes:  Negative for eye problems.  Respiratory:  Positive for cough. Negative for chest tightness.   Cardiovascular:  Negative for chest pain.  Gastrointestinal:  Negative for abdominal distention, abdominal pain, blood in stool and nausea.  Endocrine: Negative for hot flashes.  Genitourinary:  Negative for difficulty urinating and frequency.   Musculoskeletal:  Negative for arthralgias.  Skin:  Negative for itching and rash.  Neurological:  Negative for extremity weakness.  Hematological:  Negative for adenopathy.  Psychiatric/Behavioral:  Negative for confusion.     MEDICAL HISTORY:  Past Medical History:  Diagnosis Date   Arthritis    Cancer    Carotid atherosclerosis    Carotid bruit    Cervical cancer  COPD (chronic obstructive pulmonary disease)    emphysema   Coronary artery disease    Family history of adverse reaction to anesthesia    paternal grandfather died during surgery-pt unaware what happened   GERD (gastroesophageal reflux disease)    Hyperlipidemia    Hypertension    Hyponatremia 02/27/2019   Hypothyroidism    Infected cat bite 1980s   was hospitalized   Irregular heartbeat    Medical history non-contributory    Peripheral vascular disease    Renal insufficiency    Tobacco use     SURGICAL HISTORY: Past Surgical History:  Procedure Laterality Date   CAROTID  ENDARTERECTOMY Right Oct. 2015   Dr. Wyn Quakerew   CAROTID STENOSIS Left April 2016   carotid stenosis surgery   COLONOSCOPY WITH PROPOFOL N/A 11/08/2016   Procedure: COLONOSCOPY WITH PROPOFOL;  Surgeon: Wyline MoodAnna, Kiran, MD;  [facility];  Service: Gastroenterology;  Laterality: N/A;   ESOPHAGOGASTRODUODENOSCOPY (EGD) WITH PROPOFOL N/A 11/08/2016   Procedure: ESOPHAGOGASTRODUODENOSCOPY (EGD) WITH PROPOFOL;  Surgeon: Wyline MoodAnna, Kiran, MD;  [facility];  Service: Gastroenterology;  Laterality: N/A;   FLEXIBLE BRONCHOSCOPY N/A 12/17/2014   Procedure: FLEXIBLE BRONCHOSCOPY;  Surgeon: Merwyn Katosavid B Simonds, MD;  Location: [facility] ORS;  Service: Pulmonary;  Laterality: N/A;   INCISION AND DRAINAGE / EXCISION THYROGLOSSAL CYST  April 2011   PORTA CATH INSERTION N/A 03/03/2021   Procedure: PORTA CATH INSERTION;  Surgeon: Annice Needyew, Jason S, MD;  Location: [facility] INVASIVE CV LAB;  Service: Cardiovascular;  Laterality: N/A;   VIDEO BRONCHOSCOPY WITH ENDOBRONCHIAL ULTRASOUND N/A 01/12/2021   Procedure: VIDEO BRONCHOSCOPY WITH ENDOBRONCHIAL ULTRASOUND;  Surgeon: Salena SanerGonzalez, Carmen L, MD;  Location: [facility] ORS;  Service: Cardiopulmonary;  Laterality: N/A;    SOCIAL HISTORY: Social History   Socioeconomic History   Marital status: Married    Spouse name: Rayna SextonRalph   Number of children: Not on file   Years of education: Not on file   Highest education level: 9th grade  Occupational History   Occupation: retired   Tobacco Use   Smoking status: Every Day    Packs/day: 0.50    Years: 50.00    Additional pack years: 0.00    Total pack years: 25.00    Types: Cigarettes   Smokeless tobacco: Never   Tobacco comments:    0.5 PPD 02/10/2021  Vaping Use   Vaping Use: Former  Substance and Sexual Activity   Alcohol use: No    Alcohol/week: 0.0 standard drinks of alcohol   Drug use: No   Sexual activity: Yes  Other Topics Concern   Not on file  Social History Narrative   Lives at home with husband   Social  Determinants of Health   Financial Resource Strain: Low Risk  (03/15/2021)   Overall Financial Resource Strain (CARDIA)    Difficulty of Paying Living Expenses: Not hard at all  Food Insecurity: No Food Insecurity (03/15/2021)   Hunger Vital Sign    Worried About Running Out of Food in the Last Year: Never true    Ran Out of Food in the Last Year: Never true  Transportation Needs: No Transportation Needs (03/15/2021)   PRAPARE - Administrator, Civil ServiceTransportation    Lack of Transportation (Medical): No    Lack of Transportation (Non-Medical): No  Physical Activity: Inactive (03/15/2021)   Exercise Vital Sign    Days of Exercise per Week: 0 days    Minutes of Exercise per Session: 0 min  Stress: No Stress Concern Present (03/15/2021)   [employer] - Occupational Stress Questionnaire    Feeling of Stress : Not at all  Social Connections: Moderately Isolated (03/15/2021)   Social Connection and Isolation Panel [NHANES]    Frequency of Communication with Friends and Family: More than three times a week    Frequency of Social Gatherings with Friends and Family: More than three times a week    Attends Religious Services: Never    Database administrator or Organizations: No    Attends Banker Meetings: Never    Marital Status: Married  Catering manager Violence: Not At Risk (03/15/2021)   Humiliation, Afraid, Rape, and Kick questionnaire    Fear of Current or Ex-Partner: No    Emotionally Abused: No    Physically Abused: No    Sexually Abused: No    FAMILY HISTORY: Family History  Problem Relation Age of Onset   Congestive Heart Failure Mother    Heart disease Mother    Hypertension Mother    Hypertension Sister    Diabetes Sister    Heart disease Sister    Heart disease Maternal Uncle    Heart disease Maternal Grandmother    Stroke Maternal Grandfather    Cancer Brother        liver, lung   Heart disease Brother    Hypertension Brother    Heart attack  Brother    COPD Neg Hx    Breast cancer Neg Hx     ALLERGIES:  is allergic to atorvastatin.  MEDICATIONS:  Current Outpatient Medications  Medication Sig Dispense Refill   acetaminophen (TYLENOL) 500 MG tablet Take 500 mg by mouth every 6 (six) hours as needed for moderate pain.     albuterol (VENTOLIN HFA) 108 (90 Base) MCG/ACT inhaler Inhale 2 puffs into the lungs every 6 (six) hours as needed for wheezing or shortness of breath. 8 g 6   alendronate (FOSAMAX) 70 MG tablet TAKE 1 TABLET EVERY 7 DAYS . TAKE WITH A FULL GLASS OF WATER ON AN EMPTY STOMACH. 12 tablet 3   clopidogrel (PLAVIX) 75 MG tablet Take 1 tablet (75 mg total) by mouth daily. 90 tablet 1   cyclobenzaprine (FLEXERIL) 10 MG tablet Take 1 tablet (10 mg total) by mouth at bedtime. 90 tablet 1   docusate sodium (COLACE) 100 MG capsule Take 1 capsule (100 mg total) by mouth daily. 30 capsule 4   Fluticasone-Umeclidin-Vilant (TRELEGY ELLIPTA) 100-62.5-25 MCG/ACT AEPB Inhale 1 puff into the lungs daily. 1 each 11   gabapentin (NEURONTIN) 100 MG capsule Take 1 capsule (100 mg total) by mouth 3 (three) times daily. 270 capsule 1   Iron-Vitamin C 65-125 MG TABS Take 1 tablet by mouth daily. 30 tablet 5   levothyroxine (SYNTHROID) 88 MCG tablet Take 1 tablet (88 mcg total) by mouth daily. 90 tablet 3   lidocaine (LIDODERM) 5 % Place 1 patch onto the skin daily. Remove & Discard patch within 12 hours or as directed by MD 90 patch 1   lidocaine-prilocaine (EMLA) cream Apply 1 application topically as needed. 60 g 3   Multiple Vitamin (MULTIVITAMIN) tablet Take 1 tablet by mouth daily.     naproxen (NAPROSYN) 500 MG tablet Take 1 tablet (500 mg total) by mouth at bedtime. 30 tablet 3   omeprazole (PRILOSEC) 20 MG capsule Take 1 capsule (20 mg total) by mouth daily. 90 capsule 3   ondansetron (ZOFRAN) 8 MG tablet Take 1 tablet (8 mg total) by mouth every 8 (eight) hours as  needed for refractory nausea / vomiting. Start on day 3 after  chemo. 30 tablet 1   prochlorperazine (COMPAZINE) 10 MG tablet Take 1 tablet (10 mg total) by mouth every 6 (six) hours as needed for nausea or vomiting. 30 tablet 0   traMADol (ULTRAM) 50 MG tablet Take 1 tablet (50 mg total) by mouth every 12 (twelve) hours as needed. 90 tablet 0   vitamin B-12 (CYANOCOBALAMIN) 500 MCG tablet Take 500 mcg by mouth daily.     No current facility-administered medications for this visit.     PHYSICAL EXAMINATION: ECOG PERFORMANCE STATUS: 1 - Symptomatic but completely ambulatory Vitals:   05/10/22 1042  BP: (!) 152/84  Pulse: 83  Resp: 18  Temp: (!) 97.5 F (36.4 C)  SpO2: 100%    Filed Weights   05/10/22 1042  Weight: 128 lb 14.4 oz (58.5 kg)     Physical Exam Constitutional:      General: She is not in acute distress. HENT:     Head: Normocephalic and atraumatic.  Eyes:     General: No scleral icterus. Cardiovascular:     Rate and Rhythm: Normal rate.  Pulmonary:     Effort: Pulmonary effort is normal. No respiratory distress.     Breath sounds: No wheezing.     Comments: Decreased breath sound bilaterally  Abdominal:     General: There is no distension.     Palpations: Abdomen is soft.  Musculoskeletal:        General: No deformity. Normal range of motion.     Cervical back: Normal range of motion and neck supple.     Comments: Trace edema bilaterally  Skin:    General: Skin is warm and dry.  Neurological:     Mental Status: She is alert and oriented to person, place, and time. Mental status is at baseline.     Cranial Nerves: No cranial nerve deficit.  Psychiatric:        Mood and Affect: Mood normal.      LABORATORY DATA:  I have reviewed the data as listed    Latest Ref Rng & Units 05/10/2022   10:10 AM 04/26/2022    9:56 AM 04/12/2022   10:08 AM  CBC  WBC 4.0 - 10.5 K/uL 7.0  5.2  5.9   Hemoglobin 12.0 - 15.0 g/dL 04.5  40.9  9.7   Hematocrit 36.0 - 46.0 % 31.3  32.5  29.2   Platelets 150 - 400 K/uL 259  249   220       Latest Ref Rng & Units 05/10/2022   10:10 AM 04/26/2022    9:56 AM 04/25/2022    3:09 PM  CMP  Glucose 70 - 99 mg/dL 811  914  85   BUN 8 - 23 mg/dL 18  15  12    Creatinine 0.44 - 1.00 mg/dL 7.82  9.56  2.13   Sodium 135 - 145 mmol/L 127  130  135   Potassium 3.5 - 5.1 mmol/L 4.1  3.9  4.6   Chloride 98 - 111 mmol/L 100  101  97   CO2 22 - 32 mmol/L 21  22  17    Calcium 8.9 - 10.3 mg/dL 8.5  9.2  9.7   Total Protein 6.5 - 8.1 g/dL 7.0  7.1    Total Bilirubin 0.3 - 1.2 mg/dL 0.5  0.4    Alkaline Phos 38 - 126 U/L 73  72    AST 15 - 41 U/L  21  21    ALT 0 - 44 U/L 11  12       Iron/TIBC/Ferritin/ %Sat    Component Value Date/Time   IRON 64 03/01/2022 1007   TIBC 237 (L) 03/01/2022 1007   FERRITIN 90 03/01/2022 1007   IRONPCTSAT 27 03/01/2022 1007      RADIOGRAPHIC STUDIES: I have personally reviewed the radiological images as listed and agreed with the findings in the report. CT Chest W Contrast  Result Date: 05/02/2022 CLINICAL DATA:  Non-small cell lung cancer EXAM: CT CHEST WITH CONTRAST TECHNIQUE: Multidetector CT imaging of the chest was performed during intravenous contrast administration. RADIATION DOSE REDUCTION: This exam was performed according to the departmental dose-optimization program which includes automated exposure control, adjustment of the mA and/or kV according to patient size and/or use of iterative reconstruction technique. CONTRAST:  75mL OMNIPAQUE IOHEXOL 300 MG/ML  SOLN COMPARISON:  01/31/2022 FINDINGS: Cardiovascular: Heart is normal in size.  No pericardial effusion. No evidence of thoracic aortic aneurysm. Atherosclerotic calcifications of the aortic arch. Moderate three-vessel coronary atherosclerosis. Left chest port terminates in the upper right atrium. Mediastinum/Nodes: Small thoracic lymph nodes, including: --10 mm short axis right hilar node (series 2/image 62), previously 8 mm --8 mm short axis right azygoesophageal recess node (series  2/image 75), previously 6 mm Lungs/Pleura: Radiation changes in the posterior right upper lobe and superior segment right lower lobe (series 4/image 83). Additional irregular scarring/radiation changes in the lateral right upper lobe (series 4/images 48 and 54). 4 mm subpleural left lower lobe nodule (series 4/image 100), grossly unchanged. No new/suspicious pulmonary nodules. Moderate centrilobular and paraseptal emphysematous changes, upper lung predominant. Biapical pleural-parenchymal scarring. No focal consolidation. No pleural effusion or pneumothorax. Upper Abdomen: Visualized upper abdomen is grossly unremarkable, noting vascular calcifications. Musculoskeletal: Mild degenerative changes of the visualized thoracolumbar spine. IMPRESSION: Radiation changes in the right lung, as above. Small thoracic lymph nodes, mildly progressive. Attention on follow-up is suggested. Otherwise, no findings specific for recurrent or metastatic disease. Aortic Atherosclerosis (ICD10-I70.0) and Emphysema (ICD10-J43.9). Electronically Signed   By: Charline Bills M.D.   On: 05/02/2022 00:53   DG Foot Complete Right  Result Date: 04/28/2022 CLINICAL DATA:  5th metarasal pain EXAM: RIGHT FOOT COMPLETE - 3+ VIEW COMPARISON:  None Available. FINDINGS: Diffusely decreased bone density. There is no evidence of fracture or dislocation. Slight cortical irregularity of the base of the fifth metatarsal. There is no evidence of arthropathy or other focal bone abnormality. Soft tissues are unremarkable. IMPRESSION: 1. Slight cortical irregularity of the base of the fifth metatarsal. Correlate with point tenderness to palpation to evaluate for an acute nondisplaced fracture. 2.  No acute displaced fracture or dislocation. 3. Diffusely decreased bone density. Electronically Signed   By: Tish Frederickson M.D.   On: 04/28/2022 01:11   CT ABDOMEN PELVIS W CONTRAST  Result Date: 02/12/2022 CLINICAL DATA:  Abdominal pain, acute,  nonlocalized EXAM: CT ABDOMEN AND PELVIS WITH CONTRAST TECHNIQUE: Multidetector CT imaging of the abdomen and pelvis was performed using the standard protocol following bolus administration of intravenous contrast. RADIATION DOSE REDUCTION: This exam was performed according to the departmental dose-optimization program which includes automated exposure control, adjustment of the mA and/or kV according to patient size and/or use of iterative reconstruction technique. CONTRAST:  80mL OMNIPAQUE IOHEXOL 300 MG/ML  SOLN COMPARISON:  10/25/2021 FINDINGS: Lower chest: No acute abnormality Hepatobiliary: No focal hepatic abnormality. Gallbladder unremarkable. Pancreas: No focal abnormality or ductal dilatation. Spleen: No focal abnormality.  Normal  size. Adrenals/Urinary Tract: No adrenal abnormality. No focal renal abnormality. No stones or hydronephrosis. Urinary bladder is unremarkable. Stomach/Bowel: Large stool burden throughout the colon suggesting constipation. Stomach and small bowel decompressed. Vascular/Lymphatic: Diffuse aortoiliac atherosclerosis. No evidence of aneurysm or adenopathy. Reproductive: Uterus and adnexa unremarkable.  No mass. Other: No free fluid or free air. Musculoskeletal: No acute bony abnormality. IMPRESSION: Large stool burden throughout the colon suggesting constipation. Aortoiliac atherosclerosis. No acute findings. Electronically Signed   By: Charlett Nose M.D.   On: 02/12/2022 18:06

## 2022-05-10 NOTE — Progress Notes (Addendum)
Nutrition Follow-up:  Patient with lung cancer and history of cervical cancer.  Patient receiving darvalumab.  Met with patient during infusion.  Reports that appetite maybe a little bit better.  Has been snacking more.  Last night able to eat steak, salad and corn on cob for supper.  Lunch yesterday was cheese, olives and crackers.  Ate a honeybun for breakfast.  Has never been a big breakfast eater.      Medications: reviewed  Labs: Na 127, creatinine 1.12, glucose 103  Anthropometrics:   Weight 128 lb 14.4 oz today 128 lb 8 oz on 2/28 126 lb 12.8 oz on 1/31 134 lb 8 oz on 11/1 135 lb on 9/22   NUTRITION DIAGNOSIS: Inadequate oral intake stable    INTERVENTION:  Continue high calorie, high protein foods to help maintain weight    MONITORING, EVALUATION, GOAL: weight trends, intake   NEXT VISIT: Wednesday, May 8 during infusion  Treydon Henricks B. Freida Busman, RD, LDN Registered Dietitian [phone]

## 2022-05-10 NOTE — Assessment & Plan Note (Signed)
Serum osm 274 Urine osm 243 Urine sodium 65 She get IVF for hydration .  Check morning cortisol.

## 2022-05-10 NOTE — Assessment & Plan Note (Addendum)
Stage III right lung adenocarcinoma,  Recurrent S/p concurrent chemotherapy and radiation- On immunotherapy durvalumab maintenance Labs reviewed and discussed with patient.  Proceed with Durvalumab treatments Repeat CT in April 2024.- slight progression of right hilar node and right azygoesophageal recess node, close monitor, repeat CT in 2-3  months.

## 2022-05-10 NOTE — Patient Instructions (Signed)
[facility] [place] [facility]  Discharge Instructions: Thank you for choosing [facility] to provide your oncology and hematology care.  If you have a lab appointment with the Cancer Center, please go directly to the Cancer Center and check in at the registration area.  Wear comfortable clothing and clothing appropriate for easy access to any Portacath or PICC line.   We strive to give you quality time with your provider. You may need to reschedule your appointment if you arrive late (15 or more minutes).  Arriving late affects you and other patients whose appointments are after yours.  Also, if you miss three or more appointments without notifying the office, you may be dismissed from the clinic at the provider's discretion.      For prescription refill requests, have your pharmacy contact our office and allow 72 hours for refills to be completed.    Today you received the following chemotherapy and/or immunotherapy agents IMFINZI      To help prevent nausea and vomiting after your treatment, we encourage you to take your nausea medication as directed.  BELOW ARE SYMPTOMS THAT SHOULD BE REPORTED IMMEDIATELY: *FEVER GREATER THAN 100.4 F (38 C) OR HIGHER *CHILLS OR SWEATING *NAUSEA AND VOMITING THAT IS NOT CONTROLLED WITH YOUR NAUSEA MEDICATION *UNUSUAL SHORTNESS OF BREATH *UNUSUAL BRUISING OR BLEEDING *URINARY PROBLEMS (pain or burning when urinating, or frequent urination) *BOWEL PROBLEMS (unusual diarrhea, constipation, pain near the anus) TENDERNESS IN MOUTH AND THROAT WITH OR WITHOUT PRESENCE OF ULCERS (sore throat, sores in mouth, or a toothache) UNUSUAL RASH, SWELLING OR PAIN  UNUSUAL VAGINAL DISCHARGE OR ITCHING   Items with * indicate a potential emergency and should be followed up as soon as possible or go to the Emergency Department if any problems should occur.  Please show the CHEMOTHERAPY ALERT CARD or IMMUNOTHERAPY ALERT CARD at check-in to  the Emergency Department and triage nurse.  Should you have questions after your visit or need to cancel or reschedule your appointment, please contact [facility] [place] [facility]  [phone] and follow the prompts.  Office hours are 8:00 a.m. to 4:30 p.m. Monday - Friday. Please note that voicemails left after 4:00 p.m. may not be returned until the following business day.  We are closed weekends and major holidays. You have access to a nurse at all times for urgent questions. Please call the main number to the clinic [phone] and follow the prompts.  For any non-urgent questions, you may also contact your provider using MyChart. We now offer e-Visits for anyone 18 and older to request care online for non-urgent symptoms. For details visit [email or web address].   Also download the MyChart app! Go to the app store, search "MyChart", open the app, select [facility], and log in with your MyChart username and password.  Durvalumab Injection What is this medication? DURVALUMAB (dur VAL ue mab) treats some types of cancer. It works by helping your immune system slow or stop the spread of cancer cells. It is a monoclonal antibody. This medicine may be used for other purposes; ask your health care provider or pharmacist if you have questions. COMMON BRAND NAME(S): IMFINZI What should I tell my care team before I take this medication? They need to know if you have any of these conditions: Allogeneic stem cell transplant (uses someone else's stem cells) Autoimmune diseases, such as Crohn disease, ulcerative colitis, lupus History of chest radiation Nervous system problems, such as Guillain-Barre syndrome, myasthenia gravis Organ   transplant An unusual or allergic reaction to durvalumab, other medications, foods, dyes, or preservatives Pregnant or trying to get pregnant Breast-feeding How should I use this medication? This medication is infused into a vein. It is given by  your care team in a hospital or clinic setting. A special MedGuide will be given to you before each treatment. Be sure to read this information carefully each time. Talk to your care team about the use of this medication in children. Special care may be needed. Overdosage: If you think you have taken too much of this medicine contact a poison control center or emergency room at once. NOTE: This medicine is only for you. Do not share this medicine with others. What if I miss a dose? Keep appointments for follow-up doses. It is important not to miss your dose. Call your care team if you are unable to keep an appointment. What may interact with this medication? Interactions have not been studied. This list may not describe all possible interactions. Give your health care provider a list of all the medicines, herbs, non-prescription drugs, or dietary supplements you use. Also tell them if you smoke, drink alcohol, or use illegal drugs. Some items may interact with your medicine. What should I watch for while using this medication? Your condition will be monitored carefully while you are receiving this medication. You may need blood work while taking this medication. This medication may cause serious skin reactions. They can happen weeks to months after starting the medication. Contact your care team right away if you notice fevers or flu-like symptoms with a rash. The rash may be red or purple and then turn into blisters or peeling of the skin. You may also notice a red rash with swelling of the face, lips, or lymph nodes in your neck or under your arms. Tell your care team right away if you have any change in your eyesight. Talk to your care team if you may be pregnant. Serious birth defects can occur if you take this medication during pregnancy and for 3 months after the last dose. You will need a negative pregnancy test before starting this medication. Contraception is recommended while taking this  medication and for 3 months after the last dose. Your care team can help you find the option that works for you. Do not breastfeed while taking this medication and for 3 months after the last dose. What side effects may I notice from receiving this medication? Side effects that you should report to your care team as soon as possible: Allergic reactions--skin rash, itching, hives, swelling of the face, lips, tongue, or throat Dry cough, shortness of breath or trouble breathing Eye pain, redness, irritation, or discharge with blurry or decreased vision Heart muscle inflammation--unusual weakness or fatigue, shortness of breath, chest pain, fast or irregular heartbeat, dizziness, swelling of the ankles, feet, or hands Hormone gland problems--headache, sensitivity to light, unusual weakness or fatigue, dizziness, fast or irregular heartbeat, increased sensitivity to cold or heat, excessive sweating, constipation, hair loss, increased thirst or amount of urine, tremors or shaking, irritability Infusion reactions--chest pain, shortness of breath or trouble breathing, feeling faint or lightheaded Kidney injury (glomerulonephritis)--decrease in the amount of urine, red or dark brown urine, foamy or bubbly urine, swelling of the ankles, hands, or feet Liver injury--right upper belly pain, loss of appetite, nausea, light-colored stool, dark yellow or brown urine, yellowing skin or eyes, unusual weakness or fatigue Pain, tingling, or numbness in the hands or feet, muscle weakness, change   in vision, confusion or trouble speaking, loss of balance or coordination, trouble walking, seizures Rash, fever, and swollen lymph nodes Redness, blistering, peeling, or loosening of the skin, including inside the mouth Sudden or severe stomach pain, bloody diarrhea, fever, nausea, vomiting Side effects that usually do not require medical attention (report these to your care team if they continue or are bothersome): Bone,  joint, or muscle pain Diarrhea Fatigue Loss of appetite Nausea Skin rash This list may not describe all possible side effects. Call your doctor for medical advice about side effects. You may report side effects to FDA at 1-800-FDA-1088. Where should I keep my medication? This medication is given in a hospital or clinic. It will not be stored at home. NOTE: This sheet is a summary. It may not cover all possible information. If you have questions about this medicine, talk to your doctor, pharmacist, or health care provider.  2023 Elsevier/Gold Standard (2021-05-20 00:00:00)     

## 2022-05-10 NOTE — Assessment & Plan Note (Signed)
Recommend smoke cessation.  

## 2022-05-10 NOTE — Progress Notes (Signed)
Pt here for follow up. No new concerns voiced.   

## 2022-05-10 NOTE — Assessment & Plan Note (Signed)
Immunotherapy plan as listed above 

## 2022-05-12 ENCOUNTER — Telehealth: Payer: Self-pay | Admitting: Oncology

## 2022-05-12 NOTE — Telephone Encounter (Signed)
Returned pt call spoke with spouse who stated that he would notify her to come in for Monday appt for fluids

## 2022-05-14 ENCOUNTER — Other Ambulatory Visit: Payer: Self-pay

## 2022-05-15 ENCOUNTER — Inpatient Hospital Stay: Payer: Medicare PPO

## 2022-05-15 ENCOUNTER — Ambulatory Visit: Payer: Medicare PPO | Admitting: Radiation Oncology

## 2022-05-15 VITALS — BP 156/80 | HR 83 | Temp 96.3°F | Resp 20

## 2022-05-15 DIAGNOSIS — Z5112 Encounter for antineoplastic immunotherapy: Secondary | ICD-10-CM | POA: Diagnosis not present

## 2022-05-15 DIAGNOSIS — E871 Hypo-osmolality and hyponatremia: Secondary | ICD-10-CM

## 2022-05-15 DIAGNOSIS — N183 Chronic kidney disease, stage 3 unspecified: Secondary | ICD-10-CM | POA: Diagnosis not present

## 2022-05-15 DIAGNOSIS — C3491 Malignant neoplasm of unspecified part of right bronchus or lung: Secondary | ICD-10-CM

## 2022-05-15 DIAGNOSIS — F17210 Nicotine dependence, cigarettes, uncomplicated: Secondary | ICD-10-CM | POA: Diagnosis not present

## 2022-05-15 DIAGNOSIS — Z79899 Other long term (current) drug therapy: Secondary | ICD-10-CM | POA: Diagnosis not present

## 2022-05-15 DIAGNOSIS — C539 Malignant neoplasm of cervix uteri, unspecified: Secondary | ICD-10-CM | POA: Diagnosis not present

## 2022-05-15 DIAGNOSIS — D631 Anemia in chronic kidney disease: Secondary | ICD-10-CM | POA: Diagnosis not present

## 2022-05-15 DIAGNOSIS — I129 Hypertensive chronic kidney disease with stage 1 through stage 4 chronic kidney disease, or unspecified chronic kidney disease: Secondary | ICD-10-CM | POA: Diagnosis not present

## 2022-05-15 LAB — BASIC METABOLIC PANEL - CANCER CENTER ONLY
Anion gap: 7 (ref 5–15)
BUN: 13 mg/dL (ref 8–23)
CO2: 19 mmol/L — ABNORMAL LOW (ref 22–32)
Calcium: 8.4 mg/dL — ABNORMAL LOW (ref 8.9–10.3)
Chloride: 99 mmol/L (ref 98–111)
Creatinine: 0.97 mg/dL (ref 0.44–1.00)
GFR, Estimated: >60 mL/min (ref >60)
Glucose, Bld: 98 mg/dL (ref 70–99)
Potassium: 4 mmol/L (ref 3.5–5.1)
Sodium: 125 mmol/L — ABNORMAL LOW (ref 135–145)

## 2022-05-15 LAB — TSH: TSH: 2.567 u[IU]/mL (ref 0.350–4.500)

## 2022-05-15 LAB — CORTISOL: Cortisol, Plasma: 10.1 ug/dL

## 2022-05-15 MED ORDER — SODIUM CHLORIDE 0.9% FLUSH
10.0000 mL | Freq: Once | INTRAVENOUS | Status: AC | PRN
  Administered 2022-05-15: 10 mL
  Filled 2022-05-15: qty 10

## 2022-05-15 MED ORDER — SODIUM CHLORIDE 0.9 % IV SOLN
Freq: Once | INTRAVENOUS | Status: AC
  Filled 2022-05-15: qty 250

## 2022-05-15 MED ORDER — HEPARIN SOD (PORK) LOCK FLUSH 100 UNIT/ML IV SOLN
500.0000 [IU] | Freq: Once | INTRAVENOUS | Status: AC | PRN
  Administered 2022-05-15: 500 [IU]
  Filled 2022-05-15: qty 5

## 2022-05-16 LAB — ACTH: C206 ACTH: 19.7 pg/mL (ref 7.2–63.3)

## 2022-05-24 ENCOUNTER — Inpatient Hospital Stay (HOSPITAL_BASED_OUTPATIENT_CLINIC_OR_DEPARTMENT_OTHER): Payer: Medicare PPO | Admitting: Oncology

## 2022-05-24 ENCOUNTER — Inpatient Hospital Stay: Payer: Medicare PPO

## 2022-05-24 ENCOUNTER — Encounter: Payer: Self-pay | Admitting: Oncology

## 2022-05-24 VITALS — BP 144/69 | HR 86 | Temp 96.9°F | Resp 17 | Wt 129.0 lb

## 2022-05-24 DIAGNOSIS — Z5112 Encounter for antineoplastic immunotherapy: Secondary | ICD-10-CM | POA: Diagnosis not present

## 2022-05-24 DIAGNOSIS — Z720 Tobacco use: Secondary | ICD-10-CM | POA: Diagnosis not present

## 2022-05-24 DIAGNOSIS — D631 Anemia in chronic kidney disease: Secondary | ICD-10-CM

## 2022-05-24 DIAGNOSIS — N183 Chronic kidney disease, stage 3 unspecified: Secondary | ICD-10-CM | POA: Diagnosis not present

## 2022-05-24 DIAGNOSIS — C3491 Malignant neoplasm of unspecified part of right bronchus or lung: Secondary | ICD-10-CM

## 2022-05-24 DIAGNOSIS — Z79899 Other long term (current) drug therapy: Secondary | ICD-10-CM | POA: Diagnosis not present

## 2022-05-24 DIAGNOSIS — N1831 Chronic kidney disease, stage 3a: Secondary | ICD-10-CM | POA: Diagnosis not present

## 2022-05-24 DIAGNOSIS — C539 Malignant neoplasm of cervix uteri, unspecified: Secondary | ICD-10-CM | POA: Diagnosis not present

## 2022-05-24 DIAGNOSIS — F17210 Nicotine dependence, cigarettes, uncomplicated: Secondary | ICD-10-CM | POA: Diagnosis not present

## 2022-05-24 DIAGNOSIS — I129 Hypertensive chronic kidney disease with stage 1 through stage 4 chronic kidney disease, or unspecified chronic kidney disease: Secondary | ICD-10-CM | POA: Diagnosis not present

## 2022-05-24 LAB — CBC WITH DIFFERENTIAL/PLATELET
Abs Immature Granulocytes: 0.02 10*3/uL (ref 0.00–0.07)
Basophils Absolute: 0 10*3/uL (ref 0.0–0.1)
Basophils Relative: 1 %
Eosinophils Absolute: 0.1 10*3/uL (ref 0.0–0.5)
Eosinophils Relative: 3 %
HCT: 31.3 % — ABNORMAL LOW (ref 36.0–46.0)
Hemoglobin: 10.8 g/dL — ABNORMAL LOW (ref 12.0–15.0)
Immature Granulocytes: 0 %
Lymphocytes Relative: 13 %
Lymphs Abs: 0.7 10*3/uL (ref 0.7–4.0)
MCH: 32.5 pg (ref 26.0–34.0)
MCHC: 34.5 g/dL (ref 30.0–36.0)
MCV: 94.3 fL (ref 80.0–100.0)
Monocytes Absolute: 0.4 10*3/uL (ref 0.1–1.0)
Monocytes Relative: 7 %
Neutro Abs: 4.3 10*3/uL (ref 1.7–7.7)
Neutrophils Relative %: 76 %
Platelets: 225 10*3/uL (ref 150–400)
RBC: 3.32 MIL/uL — ABNORMAL LOW (ref 3.87–5.11)
RDW: 13.6 % (ref 11.5–15.5)
WBC: 5.7 10*3/uL (ref 4.0–10.5)
nRBC: 0 % (ref 0.0–0.2)

## 2022-05-24 LAB — COMPREHENSIVE METABOLIC PANEL
ALT: 10 U/L (ref 0–44)
AST: 20 U/L (ref 15–41)
Albumin: 3.7 g/dL (ref 3.5–5.0)
Alkaline Phosphatase: 62 U/L (ref 38–126)
Anion gap: 8 (ref 5–15)
BUN: 17 mg/dL (ref 8–23)
CO2: 21 mmol/L — ABNORMAL LOW (ref 22–32)
Calcium: 8.9 mg/dL (ref 8.9–10.3)
Chloride: 103 mmol/L (ref 98–111)
Creatinine, Ser: 0.94 mg/dL (ref 0.44–1.00)
GFR, Estimated: >60 mL/min (ref >60)
Glucose, Bld: 109 mg/dL — ABNORMAL HIGH (ref 70–99)
Potassium: 4.1 mmol/L (ref 3.5–5.1)
Sodium: 132 mmol/L — ABNORMAL LOW (ref 135–145)
Total Bilirubin: 0.5 mg/dL (ref 0.3–1.2)
Total Protein: 7 g/dL (ref 6.5–8.1)

## 2022-05-24 MED ORDER — HEPARIN SOD (PORK) LOCK FLUSH 100 UNIT/ML IV SOLN
500.0000 [IU] | Freq: Once | INTRAVENOUS | Status: AC | PRN
  Administered 2022-05-24: 500 [IU]
  Filled 2022-05-24: qty 5

## 2022-05-24 MED ORDER — SODIUM CHLORIDE 0.9 % IV SOLN
Freq: Once | INTRAVENOUS | Status: AC
  Filled 2022-05-24: qty 250

## 2022-05-24 MED ORDER — SODIUM CHLORIDE 0.9 % IV SOLN
10.0000 mg/kg | Freq: Once | INTRAVENOUS | Status: AC
  Administered 2022-05-24: 620 mg via INTRAVENOUS
  Filled 2022-05-24: qty 2.4

## 2022-05-24 NOTE — Assessment & Plan Note (Signed)
Immunotherapy plan as listed above 

## 2022-05-24 NOTE — Assessment & Plan Note (Signed)
Stage III right lung adenocarcinoma,  Recurrent S/p concurrent chemotherapy and radiation- On immunotherapy durvalumab maintenance Labs reviewed and discussed with patient.  Proceed with Durvalumab treatments Repeat CT in April 2024.- slight progression of right hilar node and right azygoesophageal recess node, close monitor repeat CT in July 2024.

## 2022-05-24 NOTE — Assessment & Plan Note (Addendum)
Hemoglobin is stable.  Continue monitor.  Continue oral iron supplementation.

## 2022-05-24 NOTE — Patient Instructions (Signed)
[facility] [place] [facility]  Discharge Instructions: Thank you for choosing [facility] to provide your oncology and hematology care.  If you have a lab appointment with the Cancer Center, please go directly to the Cancer Center and check in at the registration area.  Wear comfortable clothing and clothing appropriate for easy access to any Portacath or PICC line.   We strive to give you quality time with your provider. You may need to reschedule your appointment if you arrive late (15 or more minutes).  Arriving late affects you and other patients whose appointments are after yours.  Also, if you miss three or more appointments without notifying the office, you may be dismissed from the clinic at the provider's discretion.      For prescription refill requests, have your pharmacy contact our office and allow 72 hours for refills to be completed.    Today you received the following chemotherapy and/or immunotherapy agents IMFINZI      To help prevent nausea and vomiting after your treatment, we encourage you to take your nausea medication as directed.  BELOW ARE SYMPTOMS THAT SHOULD BE REPORTED IMMEDIATELY: *FEVER GREATER THAN 100.4 F (38 C) OR HIGHER *CHILLS OR SWEATING *NAUSEA AND VOMITING THAT IS NOT CONTROLLED WITH YOUR NAUSEA MEDICATION *UNUSUAL SHORTNESS OF BREATH *UNUSUAL BRUISING OR BLEEDING *URINARY PROBLEMS (pain or burning when urinating, or frequent urination) *BOWEL PROBLEMS (unusual diarrhea, constipation, pain near the anus) TENDERNESS IN MOUTH AND THROAT WITH OR WITHOUT PRESENCE OF ULCERS (sore throat, sores in mouth, or a toothache) UNUSUAL RASH, SWELLING OR PAIN  UNUSUAL VAGINAL DISCHARGE OR ITCHING   Items with * indicate a potential emergency and should be followed up as soon as possible or go to the Emergency Department if any problems should occur.  Please show the CHEMOTHERAPY ALERT CARD or IMMUNOTHERAPY ALERT CARD at check-in to  the Emergency Department and triage nurse.  Should you have questions after your visit or need to cancel or reschedule your appointment, please contact [facility] [facility]  [phone] and follow the prompts.  Office hours are 8:00 a.m. to 4:30 p.m. Monday - Friday. Please note that voicemails left after 4:00 p.m. may not be returned until the following business day.  We are closed weekends and major holidays. You have access to a nurse at all times for urgent questions. Please call the main number to the clinic [phone] and follow the prompts.  For any non-urgent questions, you may also contact your provider using MyChart. We now offer e-Visits for anyone 18 and older to request care online for non-urgent symptoms. For details visit [email or web address].   Also download the MyChart app! Go to the app store, search "MyChart", open the app, select , and log in with your MyChart username and password.  Durvalumab Injection What is this medication? DURVALUMAB (dur VAL ue mab) treats some types of cancer. It works by helping your immune system slow or stop the spread of cancer cells. It is a monoclonal antibody. This medicine may be used for other purposes; ask your health care provider or pharmacist if you have questions. COMMON BRAND NAME(S): IMFINZI What should I tell my care team before I take this medication? They need to know if you have any of these conditions: Allogeneic stem cell transplant (uses someone else's stem cells) Autoimmune diseases, such as Crohn disease, ulcerative colitis, lupus History of chest radiation Nervous system problems, such as Guillain-Barre syndrome, myasthenia gravis Organ   transplant An unusual or allergic reaction to durvalumab, other medications, foods, dyes, or preservatives Pregnant or trying to get pregnant Breast-feeding How should I use this medication? This medication is infused into a vein. It is given by  your care team in a hospital or clinic setting. A special MedGuide will be given to you before each treatment. Be sure to read this information carefully each time. Talk to your care team about the use of this medication in children. Special care may be needed. Overdosage: If you think you have taken too much of this medicine contact a poison control center or emergency room at once. NOTE: This medicine is only for you. Do not share this medicine with others. What if I miss a dose? Keep appointments for follow-up doses. It is important not to miss your dose. Call your care team if you are unable to keep an appointment. What may interact with this medication? Interactions have not been studied. This list may not describe all possible interactions. Give your health care provider a list of all the medicines, herbs, non-prescription drugs, or dietary supplements you use. Also tell them if you smoke, drink alcohol, or use illegal drugs. Some items may interact with your medicine. What should I watch for while using this medication? Your condition will be monitored carefully while you are receiving this medication. You may need blood work while taking this medication. This medication may cause serious skin reactions. They can happen weeks to months after starting the medication. Contact your care team right away if you notice fevers or flu-like symptoms with a rash. The rash may be red or purple and then turn into blisters or peeling of the skin. You may also notice a red rash with swelling of the face, lips, or lymph nodes in your neck or under your arms. Tell your care team right away if you have any change in your eyesight. Talk to your care team if you may be pregnant. Serious birth defects can occur if you take this medication during pregnancy and for 3 months after the last dose. You will need a negative pregnancy test before starting this medication. Contraception is recommended while taking this  medication and for 3 months after the last dose. Your care team can help you find the option that works for you. Do not breastfeed while taking this medication and for 3 months after the last dose. What side effects may I notice from receiving this medication? Side effects that you should report to your care team as soon as possible: Allergic reactions--skin rash, itching, hives, swelling of the face, lips, tongue, or throat Dry cough, shortness of breath or trouble breathing Eye pain, redness, irritation, or discharge with blurry or decreased vision Heart muscle inflammation--unusual weakness or fatigue, shortness of breath, chest pain, fast or irregular heartbeat, dizziness, swelling of the ankles, feet, or hands Hormone gland problems--headache, sensitivity to light, unusual weakness or fatigue, dizziness, fast or irregular heartbeat, increased sensitivity to cold or heat, excessive sweating, constipation, hair loss, increased thirst or amount of urine, tremors or shaking, irritability Infusion reactions--chest pain, shortness of breath or trouble breathing, feeling faint or lightheaded Kidney injury (glomerulonephritis)--decrease in the amount of urine, red or dark brown urine, foamy or bubbly urine, swelling of the ankles, hands, or feet Liver injury--right upper belly pain, loss of appetite, nausea, light-colored stool, dark yellow or brown urine, yellowing skin or eyes, unusual weakness or fatigue Pain, tingling, or numbness in the hands or feet, muscle weakness, change   in vision, confusion or trouble speaking, loss of balance or coordination, trouble walking, seizures Rash, fever, and swollen lymph nodes Redness, blistering, peeling, or loosening of the skin, including inside the mouth Sudden or severe stomach pain, bloody diarrhea, fever, nausea, vomiting Side effects that usually do not require medical attention (report these to your care team if they continue or are bothersome): Bone,  joint, or muscle pain Diarrhea Fatigue Loss of appetite Nausea Skin rash This list may not describe all possible side effects. Call your doctor for medical advice about side effects. You may report side effects to FDA at 1-800-FDA-1088. Where should I keep my medication? This medication is given in a hospital or clinic. It will not be stored at home. NOTE: This sheet is a summary. It may not cover all possible information. If you have questions about this medicine, talk to your doctor, pharmacist, or health care provider.  2023 Elsevier/Gold Standard (2021-05-20 00:00:00)     

## 2022-05-24 NOTE — Progress Notes (Signed)
Patient here for oncology follow-up appointment, expresses no complaints or concerns at this time.    

## 2022-05-24 NOTE — Progress Notes (Signed)
Hematology/Oncology Progress note [phone] [phone]     CHIEF COMPLAINTS/REASON FOR VISIT:  Follow up lung cancer treatments   ASSESSMENT & PLAN:   Cancer Staging  Cervical cancer Staging form: Cervix Uteri, AJCC Version 9 - Clinical: FIGO Stage IIICr (cTX, cN1) - Unsigned  Primary lung adenocarcinoma Staging form: Lung, AJCC 7th Edition - Clinical stage from 01/12/2021: T1, N1, M0 - Signed by [name], MD on 01/29/2021   Primary lung adenocarcinoma (HCC) Stage III right lung adenocarcinoma,  Recurrent S/p concurrent chemotherapy and radiation- On immunotherapy durvalumab maintenance Labs reviewed and discussed with patient.  Proceed with Durvalumab treatments Repeat CT in April 2024.- slight progression of right hilar node and right azygoesophageal recess node, close monitor repeat CT in July 2024.     Tobacco use Recommend smoke cessation.  CKD (chronic kidney disease) stage 3, GFR 30-59 ml/min (HCC) Avoid nephrotoxins. Encourage oral hydration.    Anemia in chronic kidney disease (CODE) Hemoglobin is stable.  Continue monitor.  Continue oral iron supplementation.   Encounter for antineoplastic immunotherapy Immunotherapy plan as listed above    No orders of the defined types were placed in this encounter.   Follow up in 2 weeks.  Lab MD Durvalumab.  All questions were answered. The patient knows to call the clinic with any problems, questions or concerns.  [name], MD, PhD [facility] Hematology Oncology 05/24/2022      HISTORY OF PRESENTING ILLNESS:  # Cervix cancer Patient has developed postmenopausal vaginal bleeding and discharge.  She was noted to have a friable cervix/2 cm mass of the cervix, concerning for malignancy 12/19/2018 endometrial biopsy showed scattered atypical squamous cells suspicious for malignancy.  Predominantly necrosis with associated inflammation. 01/01/2019 initial cervix biopsy showed at least  high-grade squamous intraepithelial lesion.-HPV negative 01/22/2019, repeat vaginal wall biopsy and cervix 9:00 biopsy showed squamous cell carcinoma.    Staging images 12/31/2018 showed fluid distending the endometrial canal and or endometrial thickening to the level of cervix.  No evidence of pathological lymphadenopathy.  Haziness about the lower cervix.  7 mm irregular pulmonary nodule within the right upper lobe, suspicious for possible primary or metastatic malignancy.  Chronic pleural-parenchymal scarring/fibrosis at the lung apices.  Large amount of stool.  No bowel obstruction. 01/08/2019 PET scan showed marked hypermetabolism in the region of the cervix, compatible with the reported history of cervical cancer. Small bilateral pelvic sidewall lymph nodes discernible FDG accumulation, concerning for metastatic disease although neither lymph node is enlarged by CT size criteria. 7 mm nodule in the right upper lobe shows discernible FDG accumulation.  Questionable neoplasm, primary versus metastatic. Emphysema.   # 02/20/2020 - 03/19/2020 concurrent chemotherapy weekly carboplatin and taxol and radiation for treatment of this cervix cancer.   Oncology History  Primary lung adenocarcinoma  11/20/2020 Imaging   CT chest with contrast showed minimal residual of previously seen right upper lobe nodule. 0.6 x 0.4 cm. Internal development of mild bandlike radiation fibrosis. Newly enlarged pretracheal lymph node, measuring 2.5 x 1.7 cm. Highly concerning for metastatic disease. Unchanged prominent AP window and the right hilar lymph nodes. Emphysema and diffuse bilateral bronchial wall thickening. Coronary artery disease.    12/13/2020 Imaging   PET scan showed enlarging hypermetabolic right lower paratracheal lymph node, compatible with metastatic disease. SUV 10.3. Mild FDG uptake of the bilateral sacral alla with associated lucency. Concerning for sacral insufficiency fracture. Stable posttreatment  changes of the right upper lobe nodule. Nonspecific small solid 3 mm pulmonary nodule  of the left lower lobe. 2 small to be characterized. No evidence of FDG avid metastatic disease in the abdomen or pelvis. Aortic atherosclerosis and emphysema.    01/12/2021 Initial Diagnosis   12/2019 lung nodules FDG avid, questionable primary bronchogenic carcinoma versus metastatic cancer.Discussed with radiation oncology. Her case was also discussed on tumor board. Consensus reached on finishing concurrent chemoradiation treatments for cervix cancer first. Possible SBRT to lung nodule for presumed primary lung cancer.  She was evaluated by Dr.Oaks. Options of biopsy via transthoracic or endobronchial approach vs surgical resection. Patient opted to empiric SBRT.  She lost follow up with me until Oct 2022   Primary lung adenocarcinoma [facility]) 11/28/22Patient re establish care with me  for lung cancer management.  01/12/2021 patient underwent bronchoscopy biopsy by Dr. Jayme Cloud Right paratracheal lymph node fine-needle aspiration is positive for metastatic non-small cell carcinoma, favor adenocarcinoma of lung origin.  Circulogen NGS negative PD-L1, ALK, ROS1, NTRK1/2/3,    01/12/2021 Cancer Staging   Staging form: Lung, AJCC 7th Edition - Clinical stage from 01/12/2021: T1, N1, M0 - Signed by [name], MD on 01/29/2021 Laterality: Right Stage used in treatment planning: Yes National guidelines used in treatment planning: Yes Type of national guideline used in treatment planning: NCCN   02/14/2021 - 03/28/2021 Chemotherapy   Concurrent chemotherapy and Radiation  LUNG Carboplatin / Paclitaxel + XRT q7d      04/26/2021 Imaging   CT chest with contrast showed evolving postradiation changes in the posterior perifissural upper right lung without discrete residual measurable nodule in this location. Right hilar/mediastinal lymphadenopathy stable to decreased. Tiny 0.2 cm left lower lobe pulmonary nodule  slightly decreased. Positive response to treatment. No new or progressive disease. Three-vessel coronary arthrosclerosis. Aortic atherosclerosis/emphysema.    05/19/2021 -  Chemotherapy   Patient is on Treatment Plan : LUNG Durvalumab (10) q14d     07/25/2021 Imaging   CT showed 1. Questionable slight enlargement a small left lower lobe nodule measuring up to 5 mm of the sagittal images compared with the most recent prior study. Attention on follow-up in approximately 6 months recommended.2. Otherwise stable chest CT with stable radiation changes superiorly in the right hemithorax and small residual right hilar lymph node.3. Coronary and aortic atherosclerosis (ICD10-I70.0). Emphysema   10/26/2021 Imaging   CT chest abdomen pelvis w contrast 1. Bandlike consolidative changes in the posterior RIGHT upper lobe appear more organized at the periphery. No focal masslike characteristics. Findings are compatible with evolving post treatment changes. Findings are favored to represent evolving post treatment changes. 2. Nodule in the LEFT lower lobe a 4 mm may be very slowly increasing in size over time, certainly as compared to March of 2023. Continued attention on follow-up is suggested. 3. Stable 9 mm RIGHT hilar lymph node. 4. Evidence of sacral insufficiency fractures not substantially changed. 5. Pulmonary emphysema and aortic atherosclerosis.     01/31/2022 Imaging   CT chest w contrast 1. Nodular areas developing of bandlike post treatment changes are of uncertain significance. While potentially evolving post treatment changes increasingly nodular foci amidst expected consolidative changes a mildly suspicious. PET imaging or continued short interval follow-up could be considered. 2. New small irregular nodules in the RIGHT lower lobe measuring 3-4 mm with some indistinct adjacent septal thickening. These are nonspecific and could be infectious or inflammatory. Particularly given the presence of  material in the RIGHT lower lobe bronchus. 3. Stable 4 mm LEFT lower lobe pulmonary nodule. 4. Three-vessel coronary artery calcification as on  previous imaging quite extensive. 5. Emphysema and aortic atherosclerosis.  Aortic Atherosclerosis and Emphysema   02/12/2022 Imaging   CT abdomen pelvis wo contrast Large stool burden throughout the colon suggesting constipation. Aortoiliac atherosclerosis. No acute findings.   Cervical cancer  02/03/2019 Initial Diagnosis   Malignant neoplasm of cervix (HCC)   02/20/2019 - 03/20/2019 Chemotherapy   Patient is on Treatment Plan : Carboplatin q7d w/ XRT     02/14/2021 - 03/28/2021 Chemotherapy   Concurrent chemotherapy and Radiation  LUNG Carboplatin / Paclitaxel + XRT q7d         INTERVAL HISTORY BERDA SHELVIN is a 77 y.o. female who has above history reviewed by me today presents for follow up visit for management of possible recurrent lung cancer.  History of cervix cancer.   She tolerates immunotherapy well.  She has no new complaints.  Weight is stable. Nausea has been better, she takes PRN home antiemetics. Continues to smoke.    Review of Systems  Constitutional:  Positive for fatigue. Negative for appetite change, chills, fever and unexpected weight change.  HENT:   Positive for hearing loss. Negative for voice change.   Eyes:  Negative for eye problems.  Respiratory:  Positive for cough. Negative for chest tightness.   Cardiovascular:  Negative for chest pain.  Gastrointestinal:  Negative for abdominal distention, abdominal pain, blood in stool and nausea.  Endocrine: Negative for hot flashes.  Genitourinary:  Negative for difficulty urinating and frequency.   Musculoskeletal:  Negative for arthralgias.  Skin:  Negative for itching and rash.  Neurological:  Negative for extremity weakness.  Hematological:  Negative for adenopathy.  Psychiatric/Behavioral:  Negative for confusion.     MEDICAL HISTORY:  Past Medical  History:  Diagnosis Date   Arthritis    Cancer    Carotid atherosclerosis    Carotid bruit    Cervical cancer    COPD (chronic obstructive pulmonary disease)    emphysema   Coronary artery disease    Family history of adverse reaction to anesthesia    paternal grandfather died during surgery-pt unaware what happened   GERD (gastroesophageal reflux disease)    Hyperlipidemia    Hypertension    Hyponatremia 02/27/2019   Hypothyroidism    Infected cat bite 1980s   was hospitalized   Irregular heartbeat    Medical history non-contributory    Peripheral vascular disease    Renal insufficiency    Tobacco use     SURGICAL HISTORY: Past Surgical History:  Procedure Laterality Date   CAROTID ENDARTERECTOMY Right Oct. 2015   Dr. Wyn Quaker   CAROTID STENOSIS Left April 2016   carotid stenosis surgery   COLONOSCOPY WITH PROPOFOL N/A 11/08/2016   Procedure: COLONOSCOPY WITH PROPOFOL;  Surgeon: Wyline Mood, MD;  Location[facility] ENDOSCOPY;  Service: Gastroenterology;  Laterality: N/A;   ESOPHAGOGASTRODUODENOSCOPY (EGD) WITH PROPOFOL N/A 11/08/2016   Procedure: ESOPHAGOGASTRODUODENOSCOPY (EGD) WITH PROPOFOL;  Surgeon: Wyline Mood, MD;  Location: [facility] ENDOSCOPY;  Service: Gastroenterology;  Laterality: N/A;   FLEXIBLE BRONCHOSCOPY N/A 12/17/2014   Procedure: FLEXIBLE BRONCHOSCOPY;  Surgeon: Merwyn Katos, MD;  Location: [facility] ORS;  Service: Pulmonary;  Laterality: N/A;   INCISION AND DRAINAGE / EXCISION THYROGLOSSAL CYST  April 2011   PORTA CATH INSERTION N/A 03/03/2021   Procedure: PORTA CATH INSERTION;  Surgeon: Annice Needy, MD;  Location: [facility] INVASIVE CV LAB;  Service: Cardiovascular;  Laterality: N/A;   VIDEO BRONCHOSCOPY WITH ENDOBRONCHIAL ULTRASOUND N/A 01/12/2021   Procedure: VIDEO BRONCHOSCOPY WITH ENDOBRONCHIAL  ULTRASOUND;  Surgeon: Salena Saner, MD;  Location: [facility] ORS;  Service: Cardiopulmonary;  Laterality: N/A;    SOCIAL HISTORY: Social History   Socioeconomic History    Marital status: Married    Spouse name: Rayna Sexton   Number of children: Not on file   Years of education: Not on file   Highest education level: 9th grade  Occupational History   Occupation: retired   Tobacco Use   Smoking status: Every Day    Packs/day: 0.50    Years: 50.00    Additional pack years: 0.00    Total pack years: 25.00    Types: Cigarettes   Smokeless tobacco: Never   Tobacco comments:    0.5 PPD 02/10/2021  Vaping Use   Vaping Use: Former  Substance and Sexual Activity   Alcohol use: No    Alcohol/week: 0.0 standard drinks of alcohol   Drug use: No   Sexual activity: Yes  Other Topics Concern   Not on file  Social History Narrative   Lives at home with husband   Social Determinants of Health   Financial Resource Strain: Low Risk  (03/15/2021)   Overall Financial Resource Strain (CARDIA)    Difficulty of Paying Living Expenses: Not hard at all  Food Insecurity: No Food Insecurity (03/15/2021)   Hunger Vital Sign    Worried About Running Out of Food in the Last Year: Never true    Ran Out of Food in the Last Year: Never true  Transportation Needs: No Transportation Needs (03/15/2021)   PRAPARE - Administrator, Civil Service (Medical): No    Lack of Transportation (Non-Medical): No  Physical Activity: Inactive (03/15/2021)   Exercise Vital Sign    Days of Exercise per Week: 0 days    Minutes of Exercise per Session: 0 min  Stress: No Stress Concern Present (03/15/2021)   [employer] of Occupational Health - Occupational Stress Questionnaire    Feeling of Stress : Not at all  Social Connections: Moderately Isolated (03/15/2021)   Social Connection and Isolation Panel [NHANES]    Frequency of Communication with Friends and Family: More than three times a week    Frequency of Social Gatherings with Friends and Family: More than three times a week    Attends Religious Services: Never    Database administrator or Organizations: No    Attends Tax inspector Meetings: Never    Marital Status: Married  Catering manager Violence: Not At Risk (03/15/2021)   Humiliation, Afraid, Rape, and Kick questionnaire    Fear of Current or Ex-Partner: No    Emotionally Abused: No    Physically Abused: No    Sexually Abused: No    FAMILY HISTORY: Family History  Problem Relation Age of Onset   Congestive Heart Failure Mother    Heart disease Mother    Hypertension Mother    Hypertension Sister    Diabetes Sister    Heart disease Sister    Heart disease Maternal Uncle    Heart disease Maternal Grandmother    Stroke Maternal Grandfather    Cancer Brother        liver, lung   Heart disease Brother    Hypertension Brother    Heart attack Brother    COPD Neg Hx    Breast cancer Neg Hx     ALLERGIES:  is allergic to atorvastatin.  MEDICATIONS:  Current Outpatient Medications  Medication Sig Dispense Refill   acetaminophen (TYLENOL) 500 MG  tablet Take 500 mg by mouth every 6 (six) hours as needed for moderate pain.     albuterol (VENTOLIN HFA) 108 (90 Base) MCG/ACT inhaler Inhale 2 puffs into the lungs every 6 (six) hours as needed for wheezing or shortness of breath. 8 g 6   alendronate (FOSAMAX) 70 MG tablet TAKE 1 TABLET EVERY 7 DAYS . TAKE WITH A FULL GLASS OF WATER ON AN EMPTY STOMACH. 12 tablet 3   clopidogrel (PLAVIX) 75 MG tablet Take 1 tablet (75 mg total) by mouth daily. 90 tablet 1   cyclobenzaprine (FLEXERIL) 10 MG tablet Take 1 tablet (10 mg total) by mouth at bedtime. 90 tablet 1   docusate sodium (COLACE) 100 MG capsule Take 1 capsule (100 mg total) by mouth daily. 30 capsule 4   Fluticasone-Umeclidin-Vilant (TRELEGY ELLIPTA) 100-62.5-25 MCG/ACT AEPB Inhale 1 puff into the lungs daily. 1 each 11   gabapentin (NEURONTIN) 100 MG capsule Take 1 capsule (100 mg total) by mouth 3 (three) times daily. 270 capsule 1   Iron-Vitamin C 65-125 MG TABS Take 1 tablet by mouth daily. 30 tablet 5   levothyroxine (SYNTHROID) 88 MCG  tablet Take 1 tablet (88 mcg total) by mouth daily. 90 tablet 3   lidocaine (LIDODERM) 5 % Place 1 patch onto the skin daily. Remove & Discard patch within 12 hours or as directed by MD 90 patch 1   lidocaine-prilocaine (EMLA) cream Apply 1 application topically as needed. 60 g 3   Multiple Vitamin (MULTIVITAMIN) tablet Take 1 tablet by mouth daily.     naproxen (NAPROSYN) 500 MG tablet Take 1 tablet (500 mg total) by mouth at bedtime. 30 tablet 3   omeprazole (PRILOSEC) 20 MG capsule Take 1 capsule (20 mg total) by mouth daily. 90 capsule 3   ondansetron (ZOFRAN) 8 MG tablet Take 1 tablet (8 mg total) by mouth every 8 (eight) hours as needed for refractory nausea / vomiting. Start on day 3 after chemo. 30 tablet 1   prochlorperazine (COMPAZINE) 10 MG tablet Take 1 tablet (10 mg total) by mouth every 6 (six) hours as needed for nausea or vomiting. 30 tablet 0   traMADol (ULTRAM) 50 MG tablet Take 1 tablet (50 mg total) by mouth every 12 (twelve) hours as needed. 90 tablet 0   vitamin B-12 (CYANOCOBALAMIN) 500 MCG tablet Take 500 mcg by mouth daily.     No current facility-administered medications for this visit.     PHYSICAL EXAMINATION: ECOG PERFORMANCE STATUS: 1 - Symptomatic but completely ambulatory Vitals:   05/24/22 0831 05/24/22 0834  BP: (!) 171/65 (!) 144/69  Pulse: 86   Resp: 17   Temp: (!) 96.9 F (36.1 C)   SpO2: 100%     Filed Weights   05/24/22 0831  Weight: 129 lb (58.5 kg)     Physical Exam Constitutional:      General: She is not in acute distress. HENT:     Head: Normocephalic and atraumatic.  Eyes:     General: No scleral icterus. Cardiovascular:     Rate and Rhythm: Normal rate.  Pulmonary:     Effort: Pulmonary effort is normal. No respiratory distress.     Breath sounds: No wheezing.     Comments: Decreased breath sound bilaterally  Abdominal:     General: There is no distension.     Palpations: Abdomen is soft.  Musculoskeletal:        General:  No deformity. Normal range of motion.  Cervical back: Normal range of motion and neck supple.     Comments: Trace edema bilaterally  Skin:    General: Skin is warm and dry.  Neurological:     Mental Status: She is alert and oriented to person, place, and time. Mental status is at baseline.     Cranial Nerves: No cranial nerve deficit.  Psychiatric:        Mood and Affect: Mood normal.      LABORATORY DATA:  I have reviewed the data as listed    Latest Ref Rng & Units 05/24/2022    8:06 AM 05/10/2022   10:10 AM 04/26/2022    9:56 AM  CBC  WBC 4.0 - 10.5 K/uL 5.7  7.0  5.2   Hemoglobin 12.0 - 15.0 g/dL 45.4  09.8  11.9   Hematocrit 36.0 - 46.0 % 31.3  31.3  32.5   Platelets 150 - 400 K/uL 225  259  249       Latest Ref Rng & Units 05/24/2022    8:06 AM 05/15/2022    9:36 AM 05/10/2022   10:10 AM  CMP  Glucose 70 - 99 mg/dL 147  98  829   BUN 8 - 23 mg/dL Creatinine 0.44 - 1.00 mg/dL 5.62  1.30  8.65   Sodium 135 - 145 mmol/L 132  125  127   Potassium 3.5 - 5.1 mmol/L 4.1  4.0  4.1   Chloride 98 - 111 mmol/L 103  99  100   CO2 22 - 32 mmol/L Calcium 8.9 - 10.3 mg/dL 8.9  8.4  8.5   Total Protein 6.5 - 8.1 g/dL 7.0   7.0   Total Bilirubin 0.3 - 1.2 mg/dL 0.5   0.5   Alkaline Phos 38 - 126 U/L 62   73   AST 15 - 41 U/L 20   21   ALT 0 - 44 U/L 10   11      Iron/TIBC/Ferritin/ %Sat    Component Value Date/Time   IRON 64 03/01/2022 1007   TIBC 237 (L) 03/01/2022 1007   FERRITIN 90 03/01/2022 1007   IRONPCTSAT 27 03/01/2022 1007      RADIOGRAPHIC STUDIES: I have personally reviewed the radiological images as listed and agreed with the findings in the report. CT Chest W Contrast  Result Date: 05/02/2022 CLINICAL DATA:  Non-small cell lung cancer EXAM: CT CHEST WITH CONTRAST TECHNIQUE: Multidetector CT imaging of the chest was performed during intravenous contrast administration. RADIATION DOSE REDUCTION: This exam was performed according to  the departmental dose-optimization program which includes automated exposure control, adjustment of the mA and/or kV according to patient size and/or use of iterative reconstruction technique. CONTRAST:  75mL OMNIPAQUE IOHEXOL 300 MG/ML  SOLN COMPARISON:  01/31/2022 FINDINGS: Cardiovascular: Heart is normal in size.  No pericardial effusion. No evidence of thoracic aortic aneurysm. Atherosclerotic calcifications of the aortic arch. Moderate three-vessel coronary atherosclerosis. Left chest port terminates in the upper right atrium. Mediastinum/Nodes: Small thoracic lymph nodes, including: --10 mm short axis right hilar node (series 2/image 62), previously 8 mm --8 mm short axis right azygoesophageal recess node (series 2/image 75), previously 6 mm Lungs/Pleura: Radiation changes in the posterior right upper lobe and superior segment right lower lobe (series 4/image 83). Additional irregular scarring/radiation changes in the lateral right upper lobe (series 4/images 48 and 54). 4 mm subpleural left lower lobe nodule (series 4/image  100), grossly unchanged. No new/suspicious pulmonary nodules. Moderate centrilobular and paraseptal emphysematous changes, upper lung predominant. Biapical pleural-parenchymal scarring. No focal consolidation. No pleural effusion or pneumothorax. Upper Abdomen: Visualized upper abdomen is grossly unremarkable, noting vascular calcifications. Musculoskeletal: Mild degenerative changes of the visualized thoracolumbar spine. IMPRESSION: Radiation changes in the right lung, as above. Small thoracic lymph nodes, mildly progressive. Attention on follow-up is suggested. Otherwise, no findings specific for recurrent or metastatic disease. Aortic Atherosclerosis (ICD10-I70.0) and Emphysema (ICD10-J43.9). Electronically Signed   By: Charline Bills M.D.   On: 05/02/2022 00:53   DG Foot Complete Right  Result Date: 04/28/2022 CLINICAL DATA:  5th metarasal pain EXAM: RIGHT FOOT COMPLETE - 3+  VIEW COMPARISON:  None Available. FINDINGS: Diffusely decreased bone density. There is no evidence of fracture or dislocation. Slight cortical irregularity of the base of the fifth metatarsal. There is no evidence of arthropathy or other focal bone abnormality. Soft tissues are unremarkable. IMPRESSION: 1. Slight cortical irregularity of the base of the fifth metatarsal. Correlate with point tenderness to palpation to evaluate for an acute nondisplaced fracture. 2.  No acute displaced fracture or dislocation. 3. Diffusely decreased bone density. Electronically Signed   By: Tish Frederickson M.D.   On: 04/28/2022 01:11

## 2022-05-24 NOTE — Assessment & Plan Note (Signed)
Recommend smoke cessation.  

## 2022-05-24 NOTE — Assessment & Plan Note (Signed)
Avoid nephrotoxins. Encourage oral hydration.  

## 2022-06-07 ENCOUNTER — Ambulatory Visit (INDEPENDENT_AMBULATORY_CARE_PROVIDER_SITE_OTHER): Payer: Medicare PPO | Admitting: Podiatry

## 2022-06-07 ENCOUNTER — Inpatient Hospital Stay: Payer: Medicare PPO

## 2022-06-07 ENCOUNTER — Inpatient Hospital Stay (HOSPITAL_BASED_OUTPATIENT_CLINIC_OR_DEPARTMENT_OTHER): Payer: Medicare PPO | Admitting: Oncology

## 2022-06-07 ENCOUNTER — Inpatient Hospital Stay: Payer: Medicare PPO | Attending: Oncology

## 2022-06-07 ENCOUNTER — Encounter: Payer: Self-pay | Admitting: Oncology

## 2022-06-07 VITALS — BP 166/73 | HR 86 | Temp 96.3°F | Resp 18 | Wt 132.4 lb

## 2022-06-07 DIAGNOSIS — C3491 Malignant neoplasm of unspecified part of right bronchus or lung: Secondary | ICD-10-CM

## 2022-06-07 DIAGNOSIS — Z8541 Personal history of malignant neoplasm of cervix uteri: Secondary | ICD-10-CM | POA: Diagnosis not present

## 2022-06-07 DIAGNOSIS — N183 Chronic kidney disease, stage 3 unspecified: Secondary | ICD-10-CM | POA: Diagnosis not present

## 2022-06-07 DIAGNOSIS — M6788 Other specified disorders of synovium and tendon, other site: Secondary | ICD-10-CM

## 2022-06-07 DIAGNOSIS — Z9221 Personal history of antineoplastic chemotherapy: Secondary | ICD-10-CM | POA: Diagnosis not present

## 2022-06-07 DIAGNOSIS — D631 Anemia in chronic kidney disease: Secondary | ICD-10-CM | POA: Diagnosis not present

## 2022-06-07 DIAGNOSIS — Z923 Personal history of irradiation: Secondary | ICD-10-CM | POA: Insufficient documentation

## 2022-06-07 DIAGNOSIS — Z5112 Encounter for antineoplastic immunotherapy: Secondary | ICD-10-CM

## 2022-06-07 DIAGNOSIS — Z720 Tobacco use: Secondary | ICD-10-CM | POA: Diagnosis not present

## 2022-06-07 DIAGNOSIS — N1831 Chronic kidney disease, stage 3a: Secondary | ICD-10-CM | POA: Diagnosis not present

## 2022-06-07 DIAGNOSIS — F17210 Nicotine dependence, cigarettes, uncomplicated: Secondary | ICD-10-CM | POA: Insufficient documentation

## 2022-06-07 DIAGNOSIS — I129 Hypertensive chronic kidney disease with stage 1 through stage 4 chronic kidney disease, or unspecified chronic kidney disease: Secondary | ICD-10-CM | POA: Insufficient documentation

## 2022-06-07 DIAGNOSIS — R102 Pelvic and perineal pain: Secondary | ICD-10-CM | POA: Insufficient documentation

## 2022-06-07 LAB — CBC WITH DIFFERENTIAL/PLATELET
Abs Immature Granulocytes: 0.04 10*3/uL (ref 0.00–0.07)
Basophils Absolute: 0.1 10*3/uL (ref 0.0–0.1)
Basophils Relative: 1 %
Eosinophils Absolute: 0.2 10*3/uL (ref 0.0–0.5)
Eosinophils Relative: 3 %
HCT: 31.3 % — ABNORMAL LOW (ref 36.0–46.0)
Hemoglobin: 10.8 g/dL — ABNORMAL LOW (ref 12.0–15.0)
Immature Granulocytes: 1 %
Lymphocytes Relative: 14 %
Lymphs Abs: 0.8 10*3/uL (ref 0.7–4.0)
MCH: 32.7 pg (ref 26.0–34.0)
MCHC: 34.5 g/dL (ref 30.0–36.0)
MCV: 94.8 fL (ref 80.0–100.0)
Monocytes Absolute: 0.5 10*3/uL (ref 0.1–1.0)
Monocytes Relative: 9 %
Neutro Abs: 4.5 10*3/uL (ref 1.7–7.7)
Neutrophils Relative %: 72 %
Platelets: 271 10*3/uL (ref 150–400)
RBC: 3.3 MIL/uL — ABNORMAL LOW (ref 3.87–5.11)
RDW: 13.3 % (ref 11.5–15.5)
WBC: 6.1 10*3/uL (ref 4.0–10.5)
nRBC: 0 % (ref 0.0–0.2)

## 2022-06-07 LAB — COMPREHENSIVE METABOLIC PANEL
ALT: 17 U/L (ref 0–44)
AST: 24 U/L (ref 15–41)
Albumin: 3.7 g/dL (ref 3.5–5.0)
Alkaline Phosphatase: 72 U/L (ref 38–126)
Anion gap: 8 (ref 5–15)
BUN: 15 mg/dL (ref 8–23)
CO2: 21 mmol/L — ABNORMAL LOW (ref 22–32)
Calcium: 8.9 mg/dL (ref 8.9–10.3)
Chloride: 103 mmol/L (ref 98–111)
Creatinine, Ser: 0.97 mg/dL (ref 0.44–1.00)
GFR, Estimated: >60 mL/min (ref >60)
Glucose, Bld: 96 mg/dL (ref 70–99)
Potassium: 4.3 mmol/L (ref 3.5–5.1)
Sodium: 132 mmol/L — ABNORMAL LOW (ref 135–145)
Total Bilirubin: 0.4 mg/dL (ref 0.3–1.2)
Total Protein: 6.9 g/dL (ref 6.5–8.1)

## 2022-06-07 LAB — TSH: TSH: 3.439 u[IU]/mL (ref 0.350–4.500)

## 2022-06-07 LAB — T4, FREE: Free T4: 0.92 ng/dL (ref 0.61–1.12)

## 2022-06-07 MED ORDER — SODIUM CHLORIDE 0.9 % IV SOLN
Freq: Once | INTRAVENOUS | Status: AC
  Filled 2022-06-07: qty 250

## 2022-06-07 MED ORDER — HEPARIN SOD (PORK) LOCK FLUSH 100 UNIT/ML IV SOLN
500.0000 [IU] | Freq: Once | INTRAVENOUS | Status: AC | PRN
  Administered 2022-06-07: 500 [IU]
  Filled 2022-06-07: qty 5

## 2022-06-07 MED ORDER — SODIUM CHLORIDE 0.9 % IV SOLN
10.0000 mg/kg | Freq: Once | INTRAVENOUS | Status: AC
  Administered 2022-06-07: 620 mg via INTRAVENOUS
  Filled 2022-06-07: qty 10

## 2022-06-07 MED ORDER — HEPARIN SOD (PORK) LOCK FLUSH 100 UNIT/ML IV SOLN
INTRAVENOUS | Status: AC
  Filled 2022-06-07: qty 5

## 2022-06-07 NOTE — Assessment & Plan Note (Signed)
Hemoglobin is stable.  Continue monitor.  Continue oral iron supplementation.  

## 2022-06-07 NOTE — Progress Notes (Signed)
Nutrition Follow-up:  Patient with lung cancer and history of cervical cancer.  Patient receiving durvalumab.  Met with patient during infusion.  Patient reports that her appetite has been fairly good.  Yesterday ate grits with butter and bacon.  Lunch was apple butter on toast and dinner was 4 pieces of pizza.  Says that she has been snacking as well.    Noted wearing ankle brace. Says she is going back to MD today.     Medications: reviewed  Labs: reviewed  Anthropometrics:   Weight 132 lb 6.4 oz  128 lb 14.4 oz on 4/10 128 lb 8 oz on 2/28 126 lb 12.8 oz on 03/01/21   NUTRITION DIAGNOSIS: Inadequate oral intake stable    INTERVENTION:  Continue high calorie, high protein foods to help maintain weight    MONITORING, EVALUATION, GOAL: weight trends, intake   NEXT VISIT: as needed  Tarisa [name] B. Freida Busman, RD, LDN Registered Dietitian [phone]

## 2022-06-07 NOTE — Assessment & Plan Note (Signed)
Recommend smoke cessation.  

## 2022-06-07 NOTE — Assessment & Plan Note (Signed)
Immunotherapy plan as listed above 

## 2022-06-07 NOTE — Patient Instructions (Signed)
[facility]  Discharge Instructions: Thank you for choosing [facility] to provide your oncology and hematology care.  If you have a lab appointment with the Cancer Center, please go directly to the Cancer Center and check in at the registration area.  Wear comfortable clothing and clothing appropriate for easy access to any Portacath or PICC line.   We strive to give you quality time with your provider. You may need to reschedule your appointment if you arrive late (15 or more minutes).  Arriving late affects you and other patients whose appointments are after yours.  Also, if you miss three or more appointments without notifying the office, you may be dismissed from the clinic at the provider's discretion.      For prescription refill requests, have your pharmacy contact our office and allow 72 hours for refills to be completed.    Today you received the following chemotherapy and/or immunotherapy agents Imfiniz     To help prevent nausea and vomiting after your treatment, we encourage you to take your nausea medication as directed.  BELOW ARE SYMPTOMS THAT SHOULD BE REPORTED IMMEDIATELY: *FEVER GREATER THAN 100.4 F (38 C) OR HIGHER *CHILLS OR SWEATING *NAUSEA AND VOMITING THAT IS NOT CONTROLLED WITH YOUR NAUSEA MEDICATION *UNUSUAL SHORTNESS OF BREATH *UNUSUAL BRUISING OR BLEEDING *URINARY PROBLEMS (pain or burning when urinating, or frequent urination) *BOWEL PROBLEMS (unusual diarrhea, constipation, pain near the anus) TENDERNESS IN MOUTH AND THROAT WITH OR WITHOUT PRESENCE OF ULCERS (sore throat, sores in mouth, or a toothache) UNUSUAL RASH, SWELLING OR PAIN  UNUSUAL VAGINAL DISCHARGE OR ITCHING   Items with * indicate a potential emergency and should be followed up as soon as possible or go to the Emergency Department if any problems should occur.  Please show the CHEMOTHERAPY ALERT CARD or IMMUNOTHERAPY ALERT CARD at check-in to the  Emergency Department and triage nurse.  Should you have questions after your visit or need to cancel or reschedule your appointment, please contact [facility]  [phone] and follow the prompts.  Office hours are 8:00 a.m. to 4:30 p.m. Monday - Friday. Please note that voicemails left after 4:00 p.m. may not be returned until the following business day.  We are closed weekends and major holidays. You have access to a nurse at all times for urgent questions. Please call the main number to the clinic [phone] and follow the prompts.  For any non-urgent questions, you may also contact your provider using MyChart. We now offer e-Visits for anyone 6 and older to request care online for non-urgent symptoms. For details visit [email or web address].   Also download the MyChart app! Go to the app store, search "MyChart", open the app, select [place], and log in with your MyChart username and password.

## 2022-06-07 NOTE — Assessment & Plan Note (Signed)
Avoid nephrotoxins. Encourage oral hydration.  

## 2022-06-07 NOTE — Assessment & Plan Note (Signed)
Stage III right lung adenocarcinoma,  Recurrent S/p concurrent chemotherapy and radiation- On immunotherapy durvalumab maintenance Labs reviewed and discussed with patient.  Proceed with Durvalumab treatments CT April 2024.- slight progression of right hilar node and right azygoesophageal recess node, close monitor repeat CT in July 2024.

## 2022-06-07 NOTE — Patient Instructions (Signed)

## 2022-06-07 NOTE — Progress Notes (Signed)
Hematology/Oncology Progress note Telephone:(336) [phone] Fax:([phone]     CHIEF COMPLAINTS/REASON FOR VISIT:  Follow up lung cancer treatments   ASSESSMENT & PLAN:   Cancer Staging  Cervical cancer (HCC) Staging form: Cervix Uteri, AJCC Version 9 - Clinical: FIGO Stage IIICr (cTX, cN1) - Unsigned  Primary lung adenocarcinoma (HCC) Staging form: Lung, AJCC 7th Edition - Clinical stage from 01/12/2021: T1, N1, M0 - Signed by Mary Patience, MD on 01/29/2021   Primary lung adenocarcinoma (HCC) Stage III right lung adenocarcinoma,  Recurrent S/p concurrent chemotherapy and radiation- On immunotherapy durvalumab maintenance Labs reviewed and discussed with patient.  Proceed with Durvalumab treatments CT April 2024.- slight progression of right hilar node and right azygoesophageal recess node, close monitor repeat CT in July 2024.     Tobacco use Recommend smoke cessation.  CKD (chronic kidney disease) stage 3, GFR 30-59 ml/min (HCC) Avoid nephrotoxins. Encourage oral hydration.    Anemia in chronic kidney disease (CODE) Hemoglobin is stable.  Continue monitor.  Continue oral iron supplementation.   Encounter for antineoplastic immunotherapy Immunotherapy plan as listed above    Orders Placed This Encounter  Procedures   CT Chest W Contrast    Standing Status:   Future    Standing Expiration Date:   06/07/2023    Order Specific Question:   If indicated for the ordered procedure, I authorize the administration of contrast media per Radiology protocol    Answer:   Yes    Order Specific Question:   Does the patient have a contrast media/X-ray dye allergy?    Answer:   No    Order Specific Question:   Preferred imaging location?    Answer:   [facility]   CBC with Differential (Cancer Center Only)    Standing Status:   Future    Standing Expiration Date:   06/07/2023   CMP (Cancer Center only)    Standing Status:   Future    Standing Expiration Date:    06/07/2023     Follow up 3 months .  All questions were answered. The patient knows to call the clinic with any problems, questions or concerns.  Mary Patience, MD, PhD [facility] Hematology Oncology 06/07/2022      HISTORY OF PRESENTING ILLNESS:  # Cervix cancer Patient has developed postmenopausal vaginal bleeding and discharge.  She was noted to have a friable cervix/2 cm mass of the cervix, concerning for malignancy 12/19/2018 endometrial biopsy showed scattered atypical squamous cells suspicious for malignancy.  Predominantly necrosis with associated inflammation. 01/01/2019 initial cervix biopsy showed at least high-grade squamous intraepithelial lesion.-HPV negative 01/22/2019, repeat vaginal wall biopsy and cervix 9:00 biopsy showed squamous cell carcinoma.    Staging images 12/31/2018 showed fluid distending the endometrial canal and or endometrial thickening to the level of cervix.  No evidence of pathological lymphadenopathy.  Haziness about the lower cervix.  7 mm irregular pulmonary nodule within the right upper lobe, suspicious for possible primary or metastatic malignancy.  Chronic pleural-parenchymal scarring/fibrosis at the lung apices.  Large amount of stool.  No bowel obstruction. 01/08/2019 PET scan showed marked hypermetabolism in the region of the cervix, compatible with the reported history of cervical cancer. Small bilateral pelvic sidewall lymph nodes discernible FDG accumulation, concerning for metastatic disease although neither lymph node is enlarged by CT size criteria. 7 mm nodule in the right upper lobe shows discernible FDG accumulation.  Questionable neoplasm, primary versus metastatic. Emphysema.   # 02/20/2020 - 03/19/2020 concurrent chemotherapy weekly carboplatin and  taxol and radiation for treatment of this cervix cancer.   Oncology History  Primary lung adenocarcinoma (HCC)  11/20/2020 Imaging   CT chest with contrast showed minimal residual of previously  seen right upper lobe nodule. 0.6 x 0.4 cm. Internal development of mild bandlike radiation fibrosis. Newly enlarged pretracheal lymph node, measuring 2.5 x 1.7 cm. Highly concerning for metastatic disease. Unchanged prominent AP window and the right hilar lymph nodes. Emphysema and diffuse bilateral bronchial wall thickening. Coronary artery disease.    12/13/2020 Imaging   PET scan showed enlarging hypermetabolic right lower paratracheal lymph node, compatible with metastatic disease. SUV 10.3. Mild FDG uptake of the bilateral sacral alla with associated lucency. Concerning for sacral insufficiency fracture. Stable posttreatment changes of the right upper lobe nodule. Nonspecific small solid 3 mm pulmonary nodule of the left lower lobe. 2 small to be characterized. No evidence of FDG avid metastatic disease in the abdomen or pelvis. Aortic atherosclerosis and emphysema.    01/12/2021 Initial Diagnosis   12/2019 lung nodules FDG avid, questionable primary bronchogenic carcinoma versus metastatic cancer.Discussed with radiation oncology. Her case was also discussed on tumor board. Consensus reached on finishing concurrent chemoradiation treatments for cervix cancer first. Possible SBRT to lung nodule for presumed primary lung cancer.  She was evaluated by Dr.Oaks. Options of biopsy via transthoracic or endobronchial approach vs surgical resection. Patient opted to empiric SBRT.  She lost follow up with me until Oct 2022   Primary lung adenocarcinoma [facility]) 11/28/22Patient re establish care with me  for lung cancer management.  01/12/2021 patient underwent bronchoscopy biopsy by Dr. Jayme Cloud Right paratracheal lymph node fine-needle aspiration is positive for metastatic non-small cell carcinoma, favor adenocarcinoma of lung origin.  Circulogen NGS negative PD-L1, ALK, ROS1, NTRK1/2/3,    01/12/2021 Cancer Staging   Staging form: Lung, AJCC 7th Edition - Clinical stage from 01/12/2021: T1, N1, M0 -  Signed by Mary Patience, MD on 01/29/2021 Laterality: Right Stage used in treatment planning: Yes National guidelines used in treatment planning: Yes Type of national guideline used in treatment planning: NCCN   02/14/2021 - 03/28/2021 Chemotherapy   Concurrent chemotherapy and Radiation  LUNG Carboplatin / Paclitaxel + XRT q7d      04/26/2021 Imaging   CT chest with contrast showed evolving postradiation changes in the posterior perifissural upper right lung without discrete residual measurable nodule in this location. Right hilar/mediastinal lymphadenopathy stable to decreased. Tiny 0.2 cm left lower lobe pulmonary nodule slightly decreased. Positive response to treatment. No new or progressive disease. Three-vessel coronary arthrosclerosis. Aortic atherosclerosis/emphysema.    05/19/2021 -  Chemotherapy   Patient is on Treatment Plan : LUNG Durvalumab (10) q14d     07/25/2021 Imaging   CT showed 1. Questionable slight enlargement a small left lower lobe nodule measuring up to 5 mm of the sagittal images compared with the most recent prior study. Attention on follow-up in approximately 6 months recommended.2. Otherwise stable chest CT with stable radiation changes superiorly in the right hemithorax and small residual right hilar lymph node.3. Coronary and aortic atherosclerosis (ICD10-I70.0). Emphysema   10/26/2021 Imaging   CT chest abdomen pelvis w contrast 1. Bandlike consolidative changes in the posterior RIGHT upper lobe appear more organized at the periphery. No focal masslike characteristics. Findings are compatible with evolving post treatment changes. Findings are favored to represent evolving post treatment changes. 2. Nodule in the LEFT lower lobe a 4 mm may be very slowly increasing in size over time, certainly as compared to March  of 2023. Continued attention on follow-up is suggested. 3. Stable 9 mm RIGHT hilar lymph node. 4. Evidence of sacral insufficiency fractures not substantially  changed. 5. Pulmonary emphysema and aortic atherosclerosis.     01/31/2022 Imaging   CT chest w contrast 1. Nodular areas developing of bandlike post treatment changes are of uncertain significance. While potentially evolving post treatment changes increasingly nodular foci amidst expected consolidative changes a mildly suspicious. PET imaging or continued short interval follow-up could be considered. 2. New small irregular nodules in the RIGHT lower lobe measuring 3-4 mm with some indistinct adjacent septal thickening. These are nonspecific and could be infectious or inflammatory. Particularly given the presence of material in the RIGHT lower lobe bronchus. 3. Stable 4 mm LEFT lower lobe pulmonary nodule. 4. Three-vessel coronary artery calcification as on previous imaging quite extensive. 5. Emphysema and aortic atherosclerosis.  Aortic Atherosclerosis and Emphysema   02/12/2022 Imaging   CT abdomen pelvis wo contrast Large stool burden throughout the colon suggesting constipation. Aortoiliac atherosclerosis. No acute findings.   Cervical cancer (HCC)  02/03/2019 Initial Diagnosis   Malignant neoplasm of cervix (HCC)   02/20/2019 - 03/20/2019 Chemotherapy   Patient is on Treatment Plan : Carboplatin q7d w/ XRT     02/14/2021 - 03/28/2021 Chemotherapy   Concurrent chemotherapy and Radiation  LUNG Carboplatin / Paclitaxel + XRT q7d         INTERVAL HISTORY Mary Hoover is a 77 y.o. female who has above history reviewed by me today presents for follow up visit for management of possible recurrent lung cancer.  History of cervix cancer.   She tolerates immunotherapy well.  She has no new complaints.  Gained weight  Continues to smoke.    Review of Systems  Constitutional:  Positive for fatigue. Negative for appetite change, chills, fever and unexpected weight change.  HENT:   Positive for hearing loss. Negative for voice change.   Eyes:  Negative for eye problems.  Respiratory:   Positive for cough. Negative for chest tightness.   Cardiovascular:  Negative for chest pain.  Gastrointestinal:  Negative for abdominal distention, abdominal pain, blood in stool and nausea.  Endocrine: Negative for hot flashes.  Genitourinary:  Negative for difficulty urinating and frequency.   Musculoskeletal:  Negative for arthralgias.  Skin:  Negative for itching and rash.  Neurological:  Negative for extremity weakness.  Hematological:  Negative for adenopathy.  Psychiatric/Behavioral:  Negative for confusion.     MEDICAL HISTORY:  Past Medical History:  Diagnosis Date   Arthritis    Cancer (HCC)    Carotid atherosclerosis    Carotid bruit    Cervical cancer (HCC)    COPD (chronic obstructive pulmonary disease) (HCC)    emphysema   Coronary artery disease    Family history of adverse reaction to anesthesia    paternal grandfather died during surgery-pt unaware what happened   GERD (gastroesophageal reflux disease)    Hyperlipidemia    Hypertension    Hyponatremia 02/27/2019   Hypothyroidism    Infected cat bite 1980s   was hospitalized   Irregular heartbeat    Medical history non-contributory    Peripheral vascular disease (HCC)    Renal insufficiency    Tobacco use     SURGICAL HISTORY: Past Surgical History:  Procedure Laterality Date   CAROTID ENDARTERECTOMY Right Oct. 2015   Dr. Wyn Quaker   CAROTID STENOSIS Left April 2016   carotid stenosis surgery   COLONOSCOPY WITH PROPOFOL N/A 11/08/2016  Procedure: COLONOSCOPY WITH PROPOFOL;  Surgeon: Wyline Mood, MD;  Location: [facility] ENDOSCOPY;  Service: Gastroenterology;  Laterality: N/A;   ESOPHAGOGASTRODUODENOSCOPY (EGD) WITH PROPOFOL N/A 11/08/2016   Procedure: ESOPHAGOGASTRODUODENOSCOPY (EGD) WITH PROPOFOL;  Surgeon: Wyline Mood, MD;  Location: [facility] ENDOSCOPY;  Service: Gastroenterology;  Laterality: N/A;   FLEXIBLE BRONCHOSCOPY N/A 12/17/2014   Procedure: FLEXIBLE BRONCHOSCOPY;  Surgeon: Merwyn Katos, MD;   Location: [facility] ORS;  Service: Pulmonary;  Laterality: N/A;   INCISION AND DRAINAGE / EXCISION THYROGLOSSAL CYST  April 2011   PORTA CATH INSERTION N/A 03/03/2021   Procedure: PORTA CATH INSERTION;  Surgeon: Annice Needy, MD;  Location: [facility] INVASIVE CV LAB;  Service: Cardiovascular;  Laterality: N/A;   VIDEO BRONCHOSCOPY WITH ENDOBRONCHIAL ULTRASOUND N/A 01/12/2021   Procedure: VIDEO BRONCHOSCOPY WITH ENDOBRONCHIAL ULTRASOUND;  Surgeon: Salena Saner, MD;  Location: [facility] ORS;  Service: Cardiopulmonary;  Laterality: N/A;    SOCIAL HISTORY: Social History   Socioeconomic History   Marital status: Married    Spouse name: Rayna Sexton   Number of children: Not on file   Years of education: Not on file   Highest education level: 9th grade  Occupational History   Occupation: retired   Tobacco Use   Smoking status: Every Day    Packs/day: 0.50    Years: 50.00    Additional pack years: 0.00    Total pack years: 25.00    Types: Cigarettes   Smokeless tobacco: Never   Tobacco comments:    0.5 PPD 02/10/2021  Vaping Use   Vaping Use: Former  Substance and Sexual Activity   Alcohol use: No    Alcohol/week: 0.0 standard drinks of alcohol   Drug use: No   Sexual activity: Yes  Other Topics Concern   Not on file  Social History Narrative   Lives at home with husband   Social Determinants of Health   Financial Resource Strain: Low Risk  (03/15/2021)   Overall Financial Resource Strain (CARDIA)    Difficulty of Paying Living Expenses: Not hard at all  Food Insecurity: No Food Insecurity (03/15/2021)   Hunger Vital Sign    Worried About Running Out of Food in the Last Year: Never true    Ran Out of Food in the Last Year: Never true  Transportation Needs: No Transportation Needs (03/15/2021)   PRAPARE - Administrator, Civil Service (Medical): No    Lack of Transportation (Non-Medical): No  Physical Activity: Inactive (03/15/2021)   Exercise Vital Sign    Days of Exercise per  Week: 0 days    Minutes of Exercise per Session: 0 min  Stress: No Stress Concern Present (03/15/2021)   [employer] of Occupational Health - Occupational Stress Questionnaire    Feeling of Stress : Not at all  Social Connections: Moderately Isolated (03/15/2021)   Social Connection and Isolation Panel [NHANES]    Frequency of Communication with Friends and Family: More than three times a week    Frequency of Social Gatherings with Friends and Family: More than three times a week    Attends Religious Services: Never    Database administrator or Organizations: No    Attends Banker Meetings: Never    Marital Status: Married  Catering manager Violence: Not At Risk (03/15/2021)   Humiliation, Afraid, Rape, and Kick questionnaire    Fear of Current or Ex-Partner: No    Emotionally Abused: No    Physically Abused: No    Sexually Abused: No  FAMILY HISTORY: Family History  Problem Relation Age of Onset   Congestive Heart Failure Mother    Heart disease Mother    Hypertension Mother    Hypertension Sister    Diabetes Sister    Heart disease Sister    Heart disease Maternal Uncle    Heart disease Maternal Grandmother    Stroke Maternal Grandfather    Cancer Brother        liver, lung   Heart disease Brother    Hypertension Brother    Heart attack Brother    COPD Neg Hx    Breast cancer Neg Hx     ALLERGIES:  is allergic to atorvastatin.  MEDICATIONS:  Current Outpatient Medications  Medication Sig Dispense Refill   acetaminophen (TYLENOL) 500 MG tablet Take 500 mg by mouth every 6 (six) hours as needed for moderate pain.     albuterol (VENTOLIN HFA) 108 (90 Base) MCG/ACT inhaler Inhale 2 puffs into the lungs every 6 (six) hours as needed for wheezing or shortness of breath. 8 g 6   alendronate (FOSAMAX) 70 MG tablet TAKE 1 TABLET EVERY 7 DAYS . TAKE WITH A FULL GLASS OF WATER ON AN EMPTY STOMACH. 12 tablet 3   clopidogrel (PLAVIX) 75 MG tablet Take 1 tablet  (75 mg total) by mouth daily. 90 tablet 1   cyclobenzaprine (FLEXERIL) 10 MG tablet Take 1 tablet (10 mg total) by mouth at bedtime. 90 tablet 1   docusate sodium (COLACE) 100 MG capsule Take 1 capsule (100 mg total) by mouth daily. 30 capsule 4   Fluticasone-Umeclidin-Vilant (TRELEGY ELLIPTA) 100-62.5-25 MCG/ACT AEPB Inhale 1 puff into the lungs daily. 1 each 11   gabapentin (NEURONTIN) 100 MG capsule Take 1 capsule (100 mg total) by mouth 3 (three) times daily. 270 capsule 1   Iron-Vitamin C 65-125 MG TABS Take 1 tablet by mouth daily. 30 tablet 5   levothyroxine (SYNTHROID) 88 MCG tablet Take 1 tablet (88 mcg total) by mouth daily. 90 tablet 3   lidocaine (LIDODERM) 5 % Place 1 patch onto the skin daily. Remove & Discard patch within 12 hours or as directed by MD 90 patch 1   lidocaine-prilocaine (EMLA) cream Apply 1 application topically as needed. 60 g 3   Multiple Vitamin (MULTIVITAMIN) tablet Take 1 tablet by mouth daily.     naproxen (NAPROSYN) 500 MG tablet Take 1 tablet (500 mg total) by mouth at bedtime. 30 tablet 3   omeprazole (PRILOSEC) 20 MG capsule Take 1 capsule (20 mg total) by mouth daily. 90 capsule 3   vitamin B-12 (CYANOCOBALAMIN) 500 MCG tablet Take 500 mcg by mouth daily.     ondansetron (ZOFRAN) 8 MG tablet Take 1 tablet (8 mg total) by mouth every 8 (eight) hours as needed for refractory nausea / vomiting. Start on day 3 after chemo. (Patient not taking: Reported on 06/07/2022) 30 tablet 1   prochlorperazine (COMPAZINE) 10 MG tablet Take 1 tablet (10 mg total) by mouth every 6 (six) hours as needed for nausea or vomiting. (Patient not taking: Reported on 06/07/2022) 30 tablet 0   traMADol (ULTRAM) 50 MG tablet Take 1 tablet (50 mg total) by mouth every 12 (twelve) hours as needed. (Patient not taking: Reported on 06/07/2022) 90 tablet 0   No current facility-administered medications for this visit.   Facility-Administered Medications Ordered in Other Visits  Medication Dose  Route Frequency Provider Last Rate Last Admin   durvalumab (IMFINZI) 620 mg in sodium chloride 0.9 % 100  mL chemo infusion  10 mg/kg (Treatment Plan Recorded) Intravenous Once Mary Patience, MD       heparin lock flush 100 unit/mL  500 Units Intracatheter Once PRN Mary Patience, MD         PHYSICAL EXAMINATION: ECOG PERFORMANCE STATUS: 1 - Symptomatic but completely ambulatory Vitals:   06/07/22 0850 06/07/22 0852  BP: (!) 168/68 (!) 166/73  Pulse: 87 86  Resp: 18   Temp: (!) 96.3 F (35.7 C)   SpO2: 100%     Filed Weights   06/07/22 0850  Weight: 132 lb 6.4 oz (60.1 kg)     Physical Exam Constitutional:      General: She is not in acute distress. HENT:     Head: Normocephalic and atraumatic.  Eyes:     General: No scleral icterus. Cardiovascular:     Rate and Rhythm: Normal rate.  Pulmonary:     Effort: Pulmonary effort is normal. No respiratory distress.     Breath sounds: No wheezing.     Comments: Decreased breath sound bilaterally  Abdominal:     General: There is no distension.     Palpations: Abdomen is soft.  Musculoskeletal:        General: No deformity. Normal range of motion.     Cervical back: Normal range of motion and neck supple.     Comments: Trace edema bilaterally  Skin:    General: Skin is warm and dry.  Neurological:     Mental Status: She is alert and oriented to person, place, and time. Mental status is at baseline.     Cranial Nerves: No cranial nerve deficit.  Psychiatric:        Mood and Affect: Mood normal.      LABORATORY DATA:  I have reviewed the data as listed    Latest Ref Rng & Units 06/07/2022    8:37 AM 05/24/2022    8:06 AM 05/10/2022   10:10 AM  CBC  WBC 4.0 - 10.5 K/uL 6.1  5.7  7.0   Hemoglobin 12.0 - 15.0 g/dL 19.1  47.8  29.5   Hematocrit 36.0 - 46.0 % 31.3  31.3  31.3   Platelets 150 - 400 K/uL 271  225  259       Latest Ref Rng & Units 06/07/2022    8:37 AM 05/24/2022    8:06 AM 05/15/2022    9:36 AM  CMP  Glucose 70 -  99 mg/dL 96  621  98   BUN 8 - 23 mg/dL 15  17  13    Creatinine 0.44 - 1.00 mg/dL 3.08  6.57  8.46   Sodium 135 - 145 mmol/L 132  132  125   Potassium 3.5 - 5.1 mmol/L 4.3  4.1  4.0   Chloride 98 - 111 mmol/L 103  103  99   CO2 22 - 32 mmol/L 21  21  19    Calcium 8.9 - 10.3 mg/dL 8.9  8.9  8.4   Total Protein 6.5 - 8.1 g/dL 6.9  7.0    Total Bilirubin 0.3 - 1.2 mg/dL 0.4  0.5    Alkaline Phos 38 - 126 U/L 72  62    AST 15 - 41 U/L 24  20    ALT 0 - 44 U/L 17  10       Iron/TIBC/Ferritin/ %Sat    Component Value Date/Time   IRON 64 03/01/2022 1007   TIBC 237 (L) 03/01/2022 1007   FERRITIN 90  03/01/2022 1007   IRONPCTSAT 27 03/01/2022 1007      RADIOGRAPHIC STUDIES: I have personally reviewed the radiological images as listed and agreed with the findings in the report. CT Chest W Contrast  Result Date: 05/02/2022 CLINICAL DATA:  Non-small cell lung cancer EXAM: CT CHEST WITH CONTRAST TECHNIQUE: Multidetector CT imaging of the chest was performed during intravenous contrast administration. RADIATION DOSE REDUCTION: This exam was performed according to the departmental dose-optimization program which includes automated exposure control, adjustment of the mA and/or kV according to patient size and/or use of iterative reconstruction technique. CONTRAST:  75mL OMNIPAQUE IOHEXOL 300 MG/ML  SOLN COMPARISON:  01/31/2022 FINDINGS: Cardiovascular: Heart is normal in size.  No pericardial effusion. No evidence of thoracic aortic aneurysm. Atherosclerotic calcifications of the aortic arch. Moderate three-vessel coronary atherosclerosis. Left chest port terminates in the upper right atrium. Mediastinum/Nodes: Small thoracic lymph nodes, including: --10 mm short axis right hilar node (series 2/image 62), previously 8 mm --8 mm short axis right azygoesophageal recess node (series 2/image 75), previously 6 mm Lungs/Pleura: Radiation changes in the posterior right upper lobe and superior segment right  lower lobe (series 4/image 83). Additional irregular scarring/radiation changes in the lateral right upper lobe (series 4/images 48 and 54). 4 mm subpleural left lower lobe nodule (series 4/image 100), grossly unchanged. No new/suspicious pulmonary nodules. Moderate centrilobular and paraseptal emphysematous changes, upper lung predominant. Biapical pleural-parenchymal scarring. No focal consolidation. No pleural effusion or pneumothorax. Upper Abdomen: Visualized upper abdomen is grossly unremarkable, noting vascular calcifications. Musculoskeletal: Mild degenerative changes of the visualized thoracolumbar spine. IMPRESSION: Radiation changes in the right lung, as above. Small thoracic lymph nodes, mildly progressive. Attention on follow-up is suggested. Otherwise, no findings specific for recurrent or metastatic disease. Aortic Atherosclerosis (ICD10-I70.0) and Emphysema (ICD10-J43.9). Electronically Signed   By: Charline Bills M.D.   On: 05/02/2022 00:53   DG Foot Complete Right  Result Date: 04/28/2022 CLINICAL DATA:  5th metarasal pain EXAM: RIGHT FOOT COMPLETE - 3+ VIEW COMPARISON:  None Available. FINDINGS: Diffusely decreased bone density. There is no evidence of fracture or dislocation. Slight cortical irregularity of the base of the fifth metatarsal. There is no evidence of arthropathy or other focal bone abnormality. Soft tissues are unremarkable. IMPRESSION: 1. Slight cortical irregularity of the base of the fifth metatarsal. Correlate with point tenderness to palpation to evaluate for an acute nondisplaced fracture. 2.  No acute displaced fracture or dislocation. 3. Diffusely decreased bone density. Electronically Signed   By: Tish Frederickson M.D.   On: 04/28/2022 01:11

## 2022-06-12 NOTE — Progress Notes (Signed)
  Subjective:  Patient ID: Mary Hoover, female    DOB: Sep 28, 1945,  MRN: [id number]  Chief Complaint  Patient presents with   Tendonitis    4 week follow up peroneal tendinitis right    77 y.o. female presents with the above complaint. History confirmed with patient.  Doing much better feels stronger and not having much pain   Objective:  Physical Exam: warm, good capillary refill, no trophic changes or ulcerative lesions, normal DP and PT pulses, normal sensory exam, and today no pain or tenderness peroneals or 5th met base    Radiographs: Multiple views x-ray of the right foot:  Radiographs taken 04/28/2022 show possible small avulsion versus enthesopathy at fifth metatarsal base at peroneal tendon insertion Assessment:   1. Peroneal tendinosis, right       Plan:  Patient was evaluated and treated and all questions answered.  Doing much better continue home PT and exercises. F/U PRN   Return if symptoms worsen or fail to improve.

## 2022-06-14 ENCOUNTER — Inpatient Hospital Stay (HOSPITAL_BASED_OUTPATIENT_CLINIC_OR_DEPARTMENT_OTHER): Payer: Medicare PPO | Admitting: Obstetrics and Gynecology

## 2022-06-14 VITALS — BP 147/55 | HR 95 | Temp 96.6°F | Resp 20 | Wt 132.7 lb

## 2022-06-14 DIAGNOSIS — R102 Pelvic and perineal pain: Secondary | ICD-10-CM

## 2022-06-14 DIAGNOSIS — C3491 Malignant neoplasm of unspecified part of right bronchus or lung: Secondary | ICD-10-CM | POA: Diagnosis not present

## 2022-06-14 DIAGNOSIS — Z923 Personal history of irradiation: Secondary | ICD-10-CM | POA: Diagnosis not present

## 2022-06-14 DIAGNOSIS — F17210 Nicotine dependence, cigarettes, uncomplicated: Secondary | ICD-10-CM | POA: Diagnosis not present

## 2022-06-14 DIAGNOSIS — I129 Hypertensive chronic kidney disease with stage 1 through stage 4 chronic kidney disease, or unspecified chronic kidney disease: Secondary | ICD-10-CM | POA: Diagnosis not present

## 2022-06-14 DIAGNOSIS — C539 Malignant neoplasm of cervix uteri, unspecified: Secondary | ICD-10-CM

## 2022-06-14 DIAGNOSIS — Z8541 Personal history of malignant neoplasm of cervix uteri: Secondary | ICD-10-CM

## 2022-06-14 DIAGNOSIS — Z9221 Personal history of antineoplastic chemotherapy: Secondary | ICD-10-CM | POA: Diagnosis not present

## 2022-06-14 DIAGNOSIS — N183 Chronic kidney disease, stage 3 unspecified: Secondary | ICD-10-CM | POA: Diagnosis not present

## 2022-06-14 DIAGNOSIS — Z5112 Encounter for antineoplastic immunotherapy: Secondary | ICD-10-CM | POA: Diagnosis not present

## 2022-06-14 NOTE — Progress Notes (Signed)
GYN [facility]  [phone] [phone]  Patient Care Team: Dorcas Carrow, DO as PCP - General (Family Medicine) Benita Gutter, RN as Oncology Nurse Navigator Carmina Miller, MD as Radiation Oncologist (Radiation Oncology) Glory Buff, RN as Oncology Nurse Navigator [name], MD as Consulting Physician (Oncology)   Name of the patient: Mary Hoover  [id number]  02-22-1945   Date of visit: 06/14/2022  Gynecologic Oncology Interval Visit   Referring Provider: Dr. Almon Hercules Ob-Gyn  Chief Complaint: Stage IIIC squamous Cell Cervical Cancer, surveillance  Subjective:  Mary Hoover is a 77 y.o. female initially diagnosed with stage IIIc cervical squamous cell carcinoma s/p concurrent carboplatin (02/20/19-03/20/19) & radiation (02/17/2019- 03/24/19) followed by vaginal brachytherapy at [facility] completed 04/11/2019 with history of NSCLC s/p radiation who returns to clinic for follow-up and continued surveillance.  Patient's last pelvic exam was Nov 2023. 12/14/2021 Pap NILM/HRHPV negative   No bleeding but she does have mild pelvic pain.  She does not remember when the symptoms started but thinks has been for a while.  On maintenance immunotherapy for recurrent lung cancer.  It sounds like they may stop her maintenance therapy if her next imaging scan is reassuring.   Gynecologic Oncology History  Mary Hoover is a pleasant y.o. female who is seen in consultation from Dr. Laural Benes for new diagnosis of probable cervical cancer. Patient initially presented to her PCP with postmenopausal vaginal bleeding.  She was referred to Dr. Jean Rosenthal at [facility].  Last menstrual period around age 33.  On exam, cervix was friable and firm to palpation extending anteriorly.  Urethral caruncle also noted.  Speculum exam caused some bleeding that required silver nitrate.  Pap and endometrial biopsy were performed.  Endometrial biopsy  returned some exudative material.  Findings are concerning for neoplastic process.  Pap- 12/19/2018 - Squamous cell Carcinoma - High Risk HPV - negative  12/19/2018- Endometrial biopsy -  Scattered atypical squamous cells suspicious for malignancy  -  Predominantly necrosis with associated inflammation   CT Chest/Abdomen/Pelvis on 12/31/2018 for staging: Fluid distending the endometrial canal and/or endometrial thickening to the level of the cervix.  No adnexal mass or free fluid seen.  Haziness about the cervix of uncertain significance.  Chronic pleuroparenchymal scarring/fibrosis at lung apices, irregular pulmonary nodule within the Right upper lobe measuring 7mm, moderate emphysema.   01/08/2019 Cervical biopsy. DIAGNOSIS:  A. CERVIX; BIOPSY:  - AT LEAST HIGH-GRADE SQUAMOUS INTRAEPITHELIAL LESION (HSIL/CIN3).  Comment: Invasion is not definitively identified in this sample.  Numerous step sections are examined.  Given the exophytic growth pattern of the lesion, an underlying invasive component cannot be excluded. The diagnosis of squamous cell carcinoma on recent pap smear is noted. Immunohistochemical stain for p16 is negative.   01/08/2019 PET IMPRESSION:1. Marked hypermetabolism in the region of the cervix, compatible with the reported history of cervical cancer. 2. Small bilateral pelvic sidewall lymph nodes show discernible FDG accumulation, concerning for metastatic disease although neither lymph node is enlarged by CT size criteria. 3. 7 mm nodule in the right upper lobe shows discernible FDG accumulation. Given the discernible uptake in a tiny lung nodule of this size, neoplasm is a distinct consideration. Primary bronchogenic carcinoma or metastatic disease could have this appearance. 4.  Aortic Atherosclerois (ICD10-170.0) 5.  Emphysema. (AOZ30-Q65.9)  Stage IIIc cervical squamous cell carcinoma s/p concurrent carboplatin (02/20/19-03/20/19) & radiation (02/17/2019- 03/24/19) followed  by vaginal brachytherapy at [facility] completed 04/11/2019. NED  Lung cancer history:   04/22/2019- CT Chest WO Contrast revealed stable right upper lobe lung nodule measuring 7mm, previously given the discernible uptake on PET from 01/08/2019, in a tiny lung nodule of this size, neoplasm is a distinct consideration. Primary bronchogenic carcinoma or metastatic disease could have this appearance. Recommend continued serial follow up to ensure stability.   11/27/19 PET. # Lung Nodule/NSCLC- FDG avid on PET. Reluctant to undergo surgery. Elected for empiric SBRT completed 2021.   11/20/2020, CT chest with contrast showed minimal residual of previously seen right upper lobe nodule.  0.6 x 0.4 cm.  Internal development of mild bandlike radiation fibrosis.  Newly enlarged pretracheal lymph node, measuring 2.5 x 1.7 cm.  Highly concerning for metastatic disease.  Unchanged prominent AP window and the right hilar lymph nodes.  Emphysema and diffuse bilateral bronchial wall thickening.  Coronary artery disease. 12/13/2020, PET scan showed enlarging hypermetabolic right lower paratracheal lymph node, compatible with metastatic disease.  SUV 10.3.  Mild FDG uptake of the bilateral sacral alla with associated lucency.  Concerning for sacral insufficiency fracture.  Stable posttreatment changes of the right upper lobe nodule.  Nonspecific small solid 3 mm pulmonary nodule of the left lower lobe.  2 small to be characterized.  No evidence of FDG avid metastatic disease in the abdomen or pelvis.  Aortic atherosclerosis and emphysema.   Patient reestablish care with Dr Cathie Hoops on 12/27/2020 for lung cancer treatments    01/12/2021 patient underwent bronchoscopy biopsy by Dr. Jayme Cloud Right paratracheal lymph node fine-needle aspiration is positive for metastatic non-small cell carcinoma, favor adenocarcinoma of lung origin.   Circulogen NGS negative PD-L1, ALK, ROS1, NTRK1/2/3,   02/14/2021-03/31/2021, concurrent  carboplatin AUC 2 and taxol 45mg /m2 with radiation.   04/26/2021, CT chest with contrast showed evolving postradiation changes in the posterior perifissural upper right lung without discrete residual measurable nodule in this location.  Right hilar/mediastinal lymphadenopathy stable to decreased.  Tiny 0.2 cm left lower lobe pulmonary nodule slightly decreased.  Positive response to treatment.  No new or progressive disease.  Three-vessel coronary arthrosclerosis.  Aortic atherosclerosis/emphysema. -concurrent chemotherapy and radiation-immunotherapy durvalumab maintenance.   Also s/p carotid end arterectomy for carotid stenosis.  Oncology History  Cervical cancer (HCC)  02/03/2019 Initial Diagnosis   Malignant neoplasm of cervix (HCC)   02/20/2019 -  Chemotherapy   The patient had palonosetron (ALOXI) injection 0.25 mg, 0.25 mg, Intravenous,  Once, 5 of 5 cycles Administration: 0.25 mg (02/20/2019), 0.25 mg (02/27/2019), 0.25 mg (03/06/2019), 0.25 mg (03/13/2019), 0.25 mg (03/20/2019) CARBOplatin (PARAPLATIN) 150 mg in sodium chloride 0.9 % 100 mL chemo infusion, 150 mg (100 % of original dose 152.8 mg), Intravenous,  Once, 5 of 5 cycles Dose modification:   (original dose 152.8 mg, Cycle 1) Administration: 150 mg (02/20/2019), 150 mg (02/27/2019), 150 mg (03/06/2019), 150 mg (03/13/2019), 150 mg (03/20/2019)  for chemotherapy treatment.     Problem List: Patient Active Problem List   Diagnosis Date Noted   Encounter for antineoplastic immunotherapy 05/10/2022   Localized osteoporosis without current pathological fracture 04/03/2022   Nausea without vomiting 01/04/2022   Weight loss 01/04/2022   Skin abscess 11/04/2021   Neoplasm related pain 10/21/2021   Back pain 10/21/2021   Anemia in chronic kidney disease (CODE) 10/07/2021   CKD (chronic kidney disease) stage 3, GFR 30-59 ml/min (HCC) 09/24/2021   Advanced directives, counseling/discussion 05/19/2021   Hypocalcemia 03/21/2021   Anemia due to  antineoplastic chemotherapy 03/07/2021   Encounter for antineoplastic chemotherapy 02/21/2021  Aortic atherosclerosis (HCC) 01/04/2021   Paratracheal lymphadenopathy 12/27/2020   Osteoarthritis of spine with radiculopathy, lumbar region 06/10/2020   Hyponatremia 02/27/2019   Cervical cancer (HCC) 02/03/2019   Goals of care, counseling/discussion 02/03/2019   Carotid atherosclerosis 11/14/2016   Myopia of both eyes 01/11/2016   Primary lung adenocarcinoma (HCC)    Essential hypertension 12/12/2014   Hemoptysis 12/11/2014   Hyperlipidemia    Status post carotid endarterectomy    Tobacco use    COPD (chronic obstructive pulmonary disease) (HCC) 10/13/2013   Hypothyroidism 09/15/2013   Peripheral vascular disease (HCC) 09/15/2013   Past Medical History: Past Medical History:  Diagnosis Date   Arthritis    Cancer (HCC)    Carotid atherosclerosis    Carotid bruit    Cervical cancer (HCC)    COPD (chronic obstructive pulmonary disease) (HCC)    emphysema   Coronary artery disease    Family history of adverse reaction to anesthesia    paternal grandfather died during surgery-pt unaware what happened   GERD (gastroesophageal reflux disease)    Hyperlipidemia    Hypertension    Hyponatremia 02/27/2019   Hypothyroidism    Infected cat bite 1980s   was hospitalized   Irregular heartbeat    Medical history non-contributory    Peripheral vascular disease (HCC)    Renal insufficiency    Tobacco use    Past Surgical History: Past Surgical History:  Procedure Laterality Date   CAROTID ENDARTERECTOMY Right Oct. 2015   Dr. Wyn Quaker   CAROTID STENOSIS Left April 2016   carotid stenosis surgery   COLONOSCOPY WITH PROPOFOL N/A 11/08/2016   Procedure: COLONOSCOPY WITH PROPOFOL;  Surgeon: Wyline Mood, MD;  Location: [facility] ENDOSCOPY;  Service: Gastroenterology;  Laterality: N/A;   ESOPHAGOGASTRODUODENOSCOPY (EGD) WITH PROPOFOL N/A 11/08/2016   Procedure: ESOPHAGOGASTRODUODENOSCOPY (EGD)  WITH PROPOFOL;  Surgeon: Wyline Mood, MD;  Location: [facility] ENDOSCOPY;  Service: Gastroenterology;  Laterality: N/A;   FLEXIBLE BRONCHOSCOPY N/A 12/17/2014   Procedure: FLEXIBLE BRONCHOSCOPY;  Surgeon: Merwyn Katos, MD;  Location: [facility] ORS;  Service: Pulmonary;  Laterality: N/A;   INCISION AND DRAINAGE / EXCISION THYROGLOSSAL CYST  April 2011   PORTA CATH INSERTION N/A 03/03/2021   Procedure: PORTA CATH INSERTION;  Surgeon: Annice Needy, MD;  Location: [facility] INVASIVE CV LAB;  Service: Cardiovascular;  Laterality: N/A;   VIDEO BRONCHOSCOPY WITH ENDOBRONCHIAL ULTRASOUND N/A 01/12/2021   Procedure: VIDEO BRONCHOSCOPY WITH ENDOBRONCHIAL ULTRASOUND;  Surgeon: Salena Saner, MD;  Location: [facility] ORS;  Service: Cardiopulmonary;  Laterality: N/A;   OB/GYN History:  She underwent menopause many years ago, no HRT.  Had 5 children. Not sexually active, husband had prostate cancer.    Family History: Family History  Problem Relation Age of Onset   Congestive Heart Failure Mother    Heart disease Mother    Hypertension Mother    Hypertension Sister    Diabetes Sister    Heart disease Sister    Heart disease Maternal Uncle    Heart disease Maternal Grandmother    Stroke Maternal Grandfather    Cancer Brother        liver, lung   Heart disease Brother    Hypertension Brother    Heart attack Brother    COPD Neg Hx    Breast cancer Neg Hx     Social History: Social History   Socioeconomic History   Marital status: Married    Spouse name: Rayna Sexton   Number of children: Not on file   Years of  education: Not on file   Highest education level: 9th grade  Occupational History   Occupation: retired   Tobacco Use   Smoking status: Every Day    Packs/day: 0.50    Years: 50.00    Additional pack years: 0.00    Total pack years: 25.00    Types: Cigarettes   Smokeless tobacco: Never   Tobacco comments:    0.5 PPD 02/10/2021  Vaping Use   Vaping Use: Former  Substance and Sexual Activity    Alcohol use: No    Alcohol/week: 0.0 standard drinks of alcohol   Drug use: No   Sexual activity: Yes  Other Topics Concern   Not on file  Social History Narrative   Lives at home with husband   Social Determinants of Health   Financial Resource Strain: Low Risk  (03/15/2021)   Overall Financial Resource Strain (CARDIA)    Difficulty of Paying Living Expenses: Not hard at all  Food Insecurity: No Food Insecurity (03/15/2021)   Hunger Vital Sign    Worried About Running Out of Food in the Last Year: Never true    Ran Out of Food in the Last Year: Never true  Transportation Needs: No Transportation Needs (03/15/2021)   PRAPARE - Administrator, Civil Service (Medical): No    Lack of Transportation (Non-Medical): No  Physical Activity: Inactive (03/15/2021)   Exercise Vital Sign    Days of Exercise per Week: 0 days    Minutes of Exercise per Session: 0 min  Stress: No Stress Concern Present (03/15/2021)   [employer] of Occupational Health - Occupational Stress Questionnaire    Feeling of Stress : Not at all  Social Connections: Moderately Isolated (03/15/2021)   Social Connection and Isolation Panel [NHANES]    Frequency of Communication with Friends and Family: More than three times a week    Frequency of Social Gatherings with Friends and Family: More than three times a week    Attends Religious Services: Never    Database administrator or Organizations: No    Attends Banker Meetings: Never    Marital Status: Married  Catering manager Violence: Not At Risk (03/15/2021)   Humiliation, Afraid, Rape, and Kick questionnaire    Fear of Current or Ex-Partner: No    Emotionally Abused: No    Physically Abused: No    Sexually Abused: No   Allergies: Allergies  Allergen Reactions   Atorvastatin Other (See Comments)    dizziness    Review of Systems General: no complaints  HEENT: no complaints  Lungs: no complaints  Cardiac: no complaints  GI: no  complaints  GU: pelvic pain  Musculoskeletal: no complaints  Extremities: no complaints  Skin: no complaints  Neuro: no complaints  Endocrine: no complaints  Psych: no complaints       Objective:  Physical Examination:  Vitals:   06/14/22 1028  BP: (!) 147/55  Pulse: 95  Resp: 20  Temp: (!) 96.6 F (35.9 C)  SpO2: 100%    ECOG Performance Status: 1 - Symptomatic but completely ambulatory   GENERAL: Patient is a frail appearing female in no acute distress ABDOMEN:  Soft, nontender.  Positive, normoactive bowel sounds.  EXTREMITIES:  No peripheral edema.   NEURO:  Nonfocal. Well oriented.  Appropriate affect.  Pelvic: EGBUS: no lesions Cervix: Unable to see due to vaginal agglutination.  On exam small and nontender to palpation Vagina: Atrophic; no lesions, no discharge or bleeding; Agglutinated at  the apex Uterus: normal size, nontender, mobile Adnexa: no palpable masses, nontender    Lab Review n/a  Assessment:  Mary Hoover is a 78 y.o. female diagnosed with probably stage IIIc (T1b N1 Mx) squamous cell cancer of the cervix. On PET/CT large primary cervical cancer and small bilateral pelvic sidewall lymph nodes show discernible FDG accumulation.  Treated with external radiation and carboplatin at [facility] and brachytherapy at [facility] and completed 03/2019. NED on exam today.  PAP 12/14/2021 NILM/HRHPV negative.  Mild pelvic pain of uncertain etiology.  Exam is reassuring  Lung cancer: s/p Chemotherapy and radiation-immunotherapy durvalumab maintenance.   Medical co-morbidities complicating care: heavy smoker/emphysema, vascular disease s/p carotid endarterectomy.  Plan:   Problem List Items Addressed This Visit       Genitourinary   Cervical cancer (HCC) - Primary (Chronic)   Other Visit Diagnoses     Pelvic pain in female           She has previously advised to use vaginal dilator daily for 10 minutes.  Will continue to recommend that use, however, she  does not feel comfortable using.   RTC 6 months.  If she has worsening pelvic pain she will contact us and we can arrange for imaging.   Dr Rushie Chestnut for lung cancer surveillance.  Also will continue with Dr Cathie Hoops for maintenance Durvulumab treatment of lung cancer.   Mary Schmuck Leta Jungling, MD

## 2022-06-15 ENCOUNTER — Other Ambulatory Visit: Payer: Self-pay

## 2022-07-27 ENCOUNTER — Other Ambulatory Visit: Payer: Self-pay | Admitting: Oncology

## 2022-07-27 ENCOUNTER — Other Ambulatory Visit: Payer: Self-pay | Admitting: Family Medicine

## 2022-07-27 NOTE — Telephone Encounter (Signed)
Requested medication (s) are due for refill today: No  Requested medication (s) are on the active medication list: Yes  Last refill:  04/02/32 90tabs 1RF  Future visit scheduled: Yes  Notes to clinic:  Unable to refill per protocol, cannot delegate.      Requested Prescriptions  Pending Prescriptions Disp Refills   cyclobenzaprine (FLEXERIL) 10 MG tablet [Pharmacy Med Name: CYCLOBENZAPRINE HYDROCHLORIDE 10 MG Tablet] 90 tablet 3    Sig: TAKE 1 TABLET AT BEDTIME     Not Delegated - Analgesics:  Muscle Relaxants Failed - 07/27/2022 10:21 AM      Failed - This refill cannot be delegated      Passed - Valid encounter within last 6 months    Recent Outpatient Visits           3 months ago Right foot pain   [place] [facility] Shindler, Megan P, DO   3 months ago Encounter for [employer] annual wellness exam   [place] [facility] [place], Megan P, DO   5 months ago Essential hypertension   [facility] Wallace, Megan P, DO   8 months ago Cutaneous abscess of neck   [place] [facility] Mecum, Oswaldo Conroy, PA-C   1 year ago LUQ pain   [place] [facility] Rake, Buchanan, DO       Future Appointments             In 2 months Laural Benes, Oralia Rud, DO [facility]

## 2022-07-31 ENCOUNTER — Ambulatory Visit
Admission: RE | Admit: 2022-07-31 | Discharge: 2022-07-31 | Disposition: A | Discharge status: Home / Self Care | Payer: Medicare PPO | Source: Ambulatory Visit | Attending: Oncology | Admitting: Oncology

## 2022-07-31 ENCOUNTER — Inpatient Hospital Stay: Payer: Medicare PPO | Attending: Oncology

## 2022-07-31 DIAGNOSIS — Z95828 Presence of other vascular implants and grafts: Secondary | ICD-10-CM

## 2022-07-31 DIAGNOSIS — N183 Chronic kidney disease, stage 3 unspecified: Secondary | ICD-10-CM | POA: Insufficient documentation

## 2022-07-31 DIAGNOSIS — C3491 Malignant neoplasm of unspecified part of right bronchus or lung: Secondary | ICD-10-CM | POA: Insufficient documentation

## 2022-07-31 DIAGNOSIS — D631 Anemia in chronic kidney disease: Secondary | ICD-10-CM | POA: Insufficient documentation

## 2022-07-31 DIAGNOSIS — F17210 Nicotine dependence, cigarettes, uncomplicated: Secondary | ICD-10-CM | POA: Diagnosis not present

## 2022-07-31 DIAGNOSIS — C539 Malignant neoplasm of cervix uteri, unspecified: Secondary | ICD-10-CM | POA: Insufficient documentation

## 2022-07-31 DIAGNOSIS — C3490 Malignant neoplasm of unspecified part of unspecified bronchus or lung: Secondary | ICD-10-CM | POA: Diagnosis not present

## 2022-07-31 DIAGNOSIS — I129 Hypertensive chronic kidney disease with stage 1 through stage 4 chronic kidney disease, or unspecified chronic kidney disease: Secondary | ICD-10-CM | POA: Insufficient documentation

## 2022-07-31 DIAGNOSIS — Z79899 Other long term (current) drug therapy: Secondary | ICD-10-CM | POA: Insufficient documentation

## 2022-07-31 DIAGNOSIS — J432 Centrilobular emphysema: Secondary | ICD-10-CM | POA: Diagnosis not present

## 2022-07-31 DIAGNOSIS — I700 Atherosclerosis of aorta: Secondary | ICD-10-CM | POA: Diagnosis not present

## 2022-07-31 LAB — CBC WITH DIFFERENTIAL (CANCER CENTER ONLY)
Abs Immature Granulocytes: 0.04 10*3/uL (ref 0.00–0.07)
Basophils Absolute: 0 10*3/uL (ref 0.0–0.1)
Basophils Relative: 1 %
Eosinophils Absolute: 0.2 10*3/uL (ref 0.0–0.5)
Eosinophils Relative: 3 %
HCT: 30.7 % — ABNORMAL LOW (ref 36.0–46.0)
Hemoglobin: 10.7 g/dL — ABNORMAL LOW (ref 12.0–15.0)
Immature Granulocytes: 1 %
Lymphocytes Relative: 10 %
Lymphs Abs: 0.7 10*3/uL (ref 0.7–4.0)
MCH: 32.7 pg (ref 26.0–34.0)
MCHC: 34.9 g/dL (ref 30.0–36.0)
MCV: 93.9 fL (ref 80.0–100.0)
Monocytes Absolute: 0.6 10*3/uL (ref 0.1–1.0)
Monocytes Relative: 9 %
Neutro Abs: 5.5 10*3/uL (ref 1.7–7.7)
Neutrophils Relative %: 76 %
Platelet Count: 277 10*3/uL (ref 150–400)
RBC: 3.27 MIL/uL — ABNORMAL LOW (ref 3.87–5.11)
RDW: 12.6 % (ref 11.5–15.5)
WBC Count: 7.1 10*3/uL (ref 4.0–10.5)
nRBC: 0 % (ref 0.0–0.2)

## 2022-07-31 LAB — CMP (CANCER CENTER ONLY)
ALT: 11 U/L (ref 0–44)
AST: 18 U/L (ref 15–41)
Albumin: 3.8 g/dL (ref 3.5–5.0)
Alkaline Phosphatase: 69 U/L (ref 38–126)
Anion gap: 9 (ref 5–15)
BUN: 15 mg/dL (ref 8–23)
CO2: 21 mmol/L — ABNORMAL LOW (ref 22–32)
Calcium: 9.2 mg/dL (ref 8.9–10.3)
Chloride: 100 mmol/L (ref 98–111)
Creatinine: 1.06 mg/dL — ABNORMAL HIGH (ref 0.44–1.00)
GFR, Estimated: 55 mL/min — ABNORMAL LOW (ref >60)
Glucose, Bld: 109 mg/dL — ABNORMAL HIGH (ref 70–99)
Potassium: 3.7 mmol/L (ref 3.5–5.1)
Sodium: 130 mmol/L — ABNORMAL LOW (ref 135–145)
Total Bilirubin: 0.3 mg/dL (ref 0.3–1.2)
Total Protein: 7.1 g/dL (ref 6.5–8.1)

## 2022-07-31 MED ORDER — HEPARIN SOD (PORK) LOCK FLUSH 100 UNIT/ML IV SOLN
500.0000 [IU] | Freq: Once | INTRAVENOUS | Status: AC
  Filled 2022-07-31: qty 5

## 2022-07-31 MED ORDER — SODIUM CHLORIDE 0.9% FLUSH
10.0000 mL | Freq: Once | INTRAVENOUS | Status: AC
  Administered 2022-07-31: 10 mL via INTRAVENOUS
  Filled 2022-07-31: qty 10

## 2022-07-31 MED ORDER — HEPARIN SOD (PORK) LOCK FLUSH 100 UNIT/ML IV SOLN
500.0000 [IU] | Freq: Once | INTRAVENOUS | Status: AC
  Administered 2022-07-31: 500 [IU] via INTRAVENOUS
  Filled 2022-07-31: qty 5

## 2022-07-31 MED ORDER — HEPARIN SOD (PORK) LOCK FLUSH 100 UNIT/ML IV SOLN
INTRAVENOUS | Status: AC
  Filled 2022-07-31: qty 5

## 2022-07-31 MED ORDER — IOHEXOL 300 MG/ML  SOLN
75.0000 mL | Freq: Once | INTRAMUSCULAR | Status: AC | PRN
  Administered 2022-07-31: 75 mL via INTRAVENOUS

## 2022-08-04 ENCOUNTER — Encounter: Payer: Self-pay | Admitting: Oncology

## 2022-08-04 ENCOUNTER — Inpatient Hospital Stay: Payer: Medicare PPO | Admitting: Oncology

## 2022-08-04 VITALS — BP 134/65 | HR 94 | Temp 98.1°F | Resp 16 | Ht 68.0 in | Wt 130.0 lb

## 2022-08-04 DIAGNOSIS — D631 Anemia in chronic kidney disease: Secondary | ICD-10-CM | POA: Diagnosis not present

## 2022-08-04 DIAGNOSIS — R197 Diarrhea, unspecified: Secondary | ICD-10-CM

## 2022-08-04 DIAGNOSIS — Z720 Tobacco use: Secondary | ICD-10-CM

## 2022-08-04 DIAGNOSIS — N1831 Chronic kidney disease, stage 3a: Secondary | ICD-10-CM | POA: Diagnosis not present

## 2022-08-04 DIAGNOSIS — M545 Low back pain, unspecified: Secondary | ICD-10-CM | POA: Diagnosis not present

## 2022-08-04 DIAGNOSIS — G8929 Other chronic pain: Secondary | ICD-10-CM | POA: Diagnosis not present

## 2022-08-04 DIAGNOSIS — C3491 Malignant neoplasm of unspecified part of right bronchus or lung: Secondary | ICD-10-CM

## 2022-08-04 DIAGNOSIS — C539 Malignant neoplasm of cervix uteri, unspecified: Secondary | ICD-10-CM

## 2022-08-04 DIAGNOSIS — Z79899 Other long term (current) drug therapy: Secondary | ICD-10-CM | POA: Diagnosis not present

## 2022-08-04 DIAGNOSIS — F17210 Nicotine dependence, cigarettes, uncomplicated: Secondary | ICD-10-CM | POA: Diagnosis not present

## 2022-08-04 DIAGNOSIS — N183 Chronic kidney disease, stage 3 unspecified: Secondary | ICD-10-CM | POA: Diagnosis not present

## 2022-08-04 DIAGNOSIS — I129 Hypertensive chronic kidney disease with stage 1 through stage 4 chronic kidney disease, or unspecified chronic kidney disease: Secondary | ICD-10-CM | POA: Diagnosis not present

## 2022-08-04 NOTE — Assessment & Plan Note (Signed)
Recommend GI panel and C. difficile testing.

## 2022-08-04 NOTE — Assessment & Plan Note (Addendum)
Stage III right lung adenocarcinoma,  Recurrent S/p concurrent chemotherapy and radiation- On immunotherapy durvalumab maintenance Labs reviewed and discussed with patient.  CT April 2024.- slight progression of right hilar node and right azygoesophageal recess node, close monitor 08/01/2022, CT showed Stable postradiation changes of the right hemithorax.  No recurrence. repeat CT in 3 months.

## 2022-08-04 NOTE — Assessment & Plan Note (Signed)
Recommend smoke cessation.  

## 2022-08-04 NOTE — Assessment & Plan Note (Signed)
Continue tramadol every 6 hours as needed.  Refills were sent.

## 2022-08-04 NOTE — Assessment & Plan Note (Signed)
Hemoglobin is stable.  Continue monitor.  Continue oral iron supplementation.  

## 2022-08-04 NOTE — Progress Notes (Signed)
Hematology/Oncology Progress note [phone] Fax:([phone]     CHIEF COMPLAINTS/REASON FOR VISIT:  Follow up lung cancer treatments   ASSESSMENT & PLAN:   Cancer Staging  Cervical cancer (HCC) Staging form: Cervix Uteri, AJCC Version 9 - Clinical: FIGO Stage IIICr (cTX, cN1) - Unsigned  Primary lung adenocarcinoma (HCC) Staging form: Lung, AJCC 7th Edition - Clinical stage from 01/12/2021: T1, N1, M0 - Signed by [name], MD on 01/29/2021   Primary lung adenocarcinoma (HCC) Stage III right lung adenocarcinoma,  Recurrent S/p concurrent chemotherapy and radiation- On immunotherapy durvalumab maintenance Labs reviewed and discussed with patient.  CT April 2024.- slight progression of right hilar node and right azygoesophageal recess node, close monitor 08/01/2022, CT showed Stable postradiation changes of the right hemithorax.  No recurrence. repeat CT in 3 months.   Tobacco use Recommend smoke cessation.  Cervical cancer (HCC) Locally advanced cervical cancer stage IIIC1 with pelvic nodes involvement. S/p concurrent chemotherapy and radiation followed by brachytherapy.  Follow-up with gynecology oncology.  Mary Hoover was seen recently, recent pelvic examination reassuring.   CKD (chronic kidney disease) stage 3, GFR 30-59 ml/min (HCC) Avoid nephrotoxins. Encourage oral hydration.    Anemia in chronic kidney disease (CODE) Hemoglobin is stable.  Continue monitor.  Continue oral iron supplementation.   Back pain Continue tramadol every 6 hours as needed.  Refills were sent.   Diarrhea Recommend GI panel and C. difficile testing.    Orders Placed This Encounter  Procedures   C difficile quick screen w PCR reflex    Standing Status:   Future    Standing Expiration Date:   08/04/2023   Gastrointestinal Panel by PCR , Stool    Standing Status:   Future    Standing Expiration Date:   08/04/2023   CBC with Differential (Cancer Center Only)    Standing  Status:   Future    Standing Expiration Date:   08/04/2023   CMP (Cancer Center only)    Standing Status:   Future    Standing Expiration Date:   08/04/2023     Follow up 3 months .  All questions were answered. The patient knows to call the clinic with any problems, questions or concerns.  [name], MD, PhD [facility] Hematology Oncology 08/04/2022      HISTORY OF PRESENTING ILLNESS:  # Cervix cancer Patient has developed postmenopausal vaginal bleeding and discharge.  Mary Hoover was noted to have a friable cervix/2 cm mass of the cervix, concerning for malignancy 12/19/2018 endometrial biopsy showed scattered atypical squamous cells suspicious for malignancy.  Predominantly necrosis with associated inflammation. 01/01/2019 initial cervix biopsy showed at least high-grade squamous intraepithelial lesion.-HPV negative 01/22/2019, repeat vaginal wall biopsy and cervix 9:00 biopsy showed squamous cell carcinoma.    Staging images 12/31/2018 showed fluid distending the endometrial canal and or endometrial thickening to the level of cervix.  No evidence of pathological lymphadenopathy.  Haziness about the lower cervix.  7 mm irregular pulmonary nodule within the right upper lobe, suspicious for possible primary or metastatic malignancy.  Chronic pleural-parenchymal scarring/fibrosis at the lung apices.  Large amount of stool.  No bowel obstruction. 01/08/2019 PET scan showed marked hypermetabolism in the region of the cervix, compatible with the reported history of cervical cancer. Small bilateral pelvic sidewall lymph nodes discernible FDG accumulation, concerning for metastatic disease although neither lymph node is enlarged by CT size criteria. 7 mm nodule in the right upper lobe shows discernible FDG accumulation.  Questionable neoplasm, primary versus metastatic.  Emphysema.   # 02/20/2020 - 03/19/2020 concurrent chemotherapy weekly carboplatin and taxol and radiation for treatment of this cervix  cancer.   Oncology History  Primary lung adenocarcinoma (HCC)  11/20/2020 Imaging   CT chest with contrast showed minimal residual of previously seen right upper lobe nodule. 0.6 x 0.4 cm. Internal development of mild bandlike radiation fibrosis. Newly enlarged pretracheal lymph node, measuring 2.5 x 1.7 cm. Highly concerning for metastatic disease. Unchanged prominent AP window and the right hilar lymph nodes. Emphysema and diffuse bilateral bronchial wall thickening. Coronary artery disease.    12/13/2020 Imaging   PET scan showed enlarging hypermetabolic right lower paratracheal lymph node, compatible with metastatic disease. SUV 10.3. Mild FDG uptake of the bilateral sacral alla with associated lucency. Concerning for sacral insufficiency fracture. Stable posttreatment changes of the right upper lobe nodule. Nonspecific small solid 3 mm pulmonary nodule of the left lower lobe. 2 small to be characterized. No evidence of FDG avid metastatic disease in the abdomen or pelvis. Aortic atherosclerosis and emphysema.    01/12/2021 Initial Diagnosis   12/2019 lung nodules FDG avid, questionable primary bronchogenic carcinoma versus metastatic cancer.Discussed with radiation oncology. Her case was also discussed on tumor board. Consensus reached on finishing concurrent chemoradiation treatments for cervix cancer first. Possible SBRT to lung nodule for presumed primary lung cancer.  Mary Hoover was evaluated by Dr.Oaks. Options of biopsy via transthoracic or endobronchial approach vs surgical resection. Patient opted to empiric SBRT.  Mary Hoover lost follow up with me until Oct 2022   Primary lung adenocarcinoma [facility]) 11/28/22Patient re establish care with me  for lung cancer management.  01/12/2021 patient underwent bronchoscopy biopsy by Dr. Jayme Cloud Right paratracheal lymph node fine-needle aspiration is positive for metastatic non-small cell carcinoma, favor adenocarcinoma of lung origin.  [employer] NGS  negative PD-L1, ALK, ROS1, NTRK1/2/3,    01/12/2021 Cancer Staging   Staging form: Lung, AJCC 7th Edition - Clinical stage from 01/12/2021: T1, N1, M0 - Signed by [name], MD on 01/29/2021 Laterality: Right Stage used in treatment planning: Yes National guidelines used in treatment planning: Yes Type of national guideline used in treatment planning: NCCN   02/14/2021 - 03/28/2021 Chemotherapy   Concurrent chemotherapy and Radiation  LUNG Carboplatin / Paclitaxel + XRT q7d      04/26/2021 Imaging   CT chest with contrast showed evolving postradiation changes in the posterior perifissural upper right lung without discrete residual measurable nodule in this location. Right hilar/mediastinal lymphadenopathy stable to decreased. Tiny 0.2 cm left lower lobe pulmonary nodule slightly decreased. Positive response to treatment. No new or progressive disease. Three-vessel coronary arthrosclerosis. Aortic atherosclerosis/emphysema.    05/19/2021 -  Chemotherapy   Patient is on Treatment Plan : LUNG Durvalumab (10) q14d     07/25/2021 Imaging   CT showed 1. Questionable slight enlargement a small left lower lobe nodule measuring up to 5 mm of the sagittal images compared with the most recent prior study. Attention on follow-up in approximately 6 months recommended.2. Otherwise stable chest CT with stable radiation changes superiorly in the right hemithorax and small residual right hilar lymph node.3. Coronary and aortic atherosclerosis (ICD10-I70.0). Emphysema   10/26/2021 Imaging   CT chest abdomen pelvis w contrast 1. Bandlike consolidative changes in the posterior RIGHT upper lobe appear more organized at the periphery. No focal masslike characteristics. Findings are compatible with evolving post treatment changes. Findings are favored to represent evolving post treatment changes. 2. Nodule in the LEFT lower lobe a 4 mm may be  very slowly increasing in size over time, certainly as compared to March of  2023. Continued attention on follow-up is suggested. 3. Stable 9 mm RIGHT hilar lymph node. 4. Evidence of sacral insufficiency fractures not substantially changed. 5. Pulmonary emphysema and aortic atherosclerosis.     01/31/2022 Imaging   CT chest w contrast 1. Nodular areas developing of bandlike post treatment changes are of uncertain significance. While potentially evolving post treatment changes increasingly nodular foci amidst expected consolidative changes a mildly suspicious. PET imaging or continued short interval follow-up could be considered. 2. New small irregular nodules in the RIGHT lower lobe measuring 3-4 mm with some indistinct adjacent septal thickening. These are nonspecific and could be infectious or inflammatory. Particularly given the presence of material in the RIGHT lower lobe bronchus. 3. Stable 4 mm LEFT lower lobe pulmonary nodule. 4. Three-vessel coronary artery calcification as on previous imaging quite extensive. 5. Emphysema and aortic atherosclerosis.  Aortic Atherosclerosis and Emphysema   02/12/2022 Imaging   CT abdomen pelvis wo contrast Large stool burden throughout the colon suggesting constipation. Aortoiliac atherosclerosis. No acute findings.   Cervical cancer (HCC)  02/03/2019 Initial Diagnosis   Malignant neoplasm of cervix (HCC)   02/20/2019 - 03/20/2019 Chemotherapy   Patient is on Treatment Plan : Carboplatin q7d w/ XRT     02/14/2021 - 03/28/2021 Chemotherapy   Concurrent chemotherapy and Radiation  LUNG Carboplatin / Paclitaxel + XRT q7d         INTERVAL HISTORY SHATEMA KOST is a 77 y.o. female who has above history reviewed by me today presents for follow up visit for management of possible recurrent lung cancer.  History of cervix cancer.   Patient reports 3 weeks of diarrhea, multiple watery bowel movements per day.  Mary Hoover uses Imodium with partial relief.  Denies any abdominal pain, nausea vomiting, fever or chills.   Review of  Systems  Constitutional:  Positive for fatigue. Negative for appetite change, chills, fever and unexpected weight change.  HENT:   Positive for hearing loss. Negative for voice change.   Eyes:  Negative for eye problems.  Respiratory:  Positive for cough. Negative for chest tightness.   Cardiovascular:  Negative for chest pain.  Gastrointestinal:  Negative for abdominal distention, abdominal pain, blood in stool and nausea.  Endocrine: Negative for hot flashes.  Genitourinary:  Negative for difficulty urinating and frequency.   Musculoskeletal:  Negative for arthralgias.  Skin:  Negative for itching and rash.  Neurological:  Negative for extremity weakness.  Hematological:  Negative for adenopathy.  Psychiatric/Behavioral:  Negative for confusion.     MEDICAL HISTORY:  Past Medical History:  Diagnosis Date   Arthritis    Cancer (HCC)    Carotid atherosclerosis    Carotid bruit    Cervical cancer (HCC)    COPD (chronic obstructive pulmonary disease) (HCC)    emphysema   Coronary artery disease    Family history of adverse reaction to anesthesia    paternal grandfather died during surgery-pt unaware what happened   GERD (gastroesophageal reflux disease)    Hyperlipidemia    Hypertension    Hyponatremia 02/27/2019   Hypothyroidism    Infected cat bite 1980s   was hospitalized   Irregular heartbeat    Medical history non-contributory    Peripheral vascular disease (HCC)    Renal insufficiency    Tobacco use     SURGICAL HISTORY: Past Surgical History:  Procedure Laterality Date   CAROTID ENDARTERECTOMY Right Oct. 2015  Dr. Wyn Quaker   CAROTID STENOSIS Left April 2016   carotid stenosis surgery   COLONOSCOPY WITH PROPOFOL N/A 11/08/2016   Procedure: COLONOSCOPY WITH PROPOFOL;  Surgeon: Wyline Mood, MD;  Location: [facility] ENDOSCOPY;  Service: Gastroenterology;  Laterality: N/A;   ESOPHAGOGASTRODUODENOSCOPY (EGD) WITH PROPOFOL N/A 11/08/2016   Procedure:  ESOPHAGOGASTRODUODENOSCOPY (EGD) WITH PROPOFOL;  Surgeon: Wyline Mood, MD;  Location: [facility] ENDOSCOPY;  Service: Gastroenterology;  Laterality: N/A;   FLEXIBLE BRONCHOSCOPY N/A 12/17/2014   Procedure: FLEXIBLE BRONCHOSCOPY;  Surgeon: Merwyn Katos, MD;  Location: [facility] ORS;  Service: Pulmonary;  Laterality: N/A;   INCISION AND DRAINAGE / EXCISION THYROGLOSSAL CYST  April 2011   PORTA CATH INSERTION N/A 03/03/2021   Procedure: PORTA CATH INSERTION;  Surgeon: Annice Needy, MD;  Location: [facility] INVASIVE CV LAB;  Service: Cardiovascular;  Laterality: N/A;   VIDEO BRONCHOSCOPY WITH ENDOBRONCHIAL ULTRASOUND N/A 01/12/2021   Procedure: VIDEO BRONCHOSCOPY WITH ENDOBRONCHIAL ULTRASOUND;  Surgeon: Salena Saner, MD;  Location: [facility] ORS;  Service: Cardiopulmonary;  Laterality: N/A;    SOCIAL HISTORY: Social History   Socioeconomic History   Marital status: Married    Spouse name: Rayna Sexton   Number of children: Not on file   Years of education: Not on file   Highest education level: 9th grade  Occupational History   Occupation: retired   Tobacco Use   Smoking status: Every Day    Packs/day: 0.50    Years: 50.00    Additional pack years: 0.00    Total pack years: 25.00    Types: Cigarettes   Smokeless tobacco: Never   Tobacco comments:    0.5 PPD 02/10/2021  Vaping Use   Vaping Use: Former  Substance and Sexual Activity   Alcohol use: No    Alcohol/week: 0.0 standard drinks of alcohol   Drug use: No   Sexual activity: Yes  Other Topics Concern   Not on file  Social History Narrative   Lives at home with husband   Social Determinants of Health   Financial Resource Strain: Low Risk  (03/15/2021)   Overall Financial Resource Strain (CARDIA)    Difficulty of Paying Living Expenses: Not hard at all  Food Insecurity: No Food Insecurity (03/15/2021)   Hunger Vital Sign    Worried About Running Out of Food in the Last Year: Never true    Ran Out of Food in the Last Year: Never true   Transportation Needs: No Transportation Needs (03/15/2021)   PRAPARE - Administrator, Civil Service (Medical): No    Lack of Transportation (Non-Medical): No  Physical Activity: Inactive (03/15/2021)   Exercise Vital Sign    Days of Exercise per Week: 0 days    Minutes of Exercise per Session: 0 min  Stress: No Stress Concern Present (03/15/2021)   [employer] of Occupational Health - Occupational Stress Questionnaire    Feeling of Stress : Not at all  Social Connections: Moderately Isolated (03/15/2021)   Social Connection and Isolation Panel [NHANES]    Frequency of Communication with Friends and Family: More than three times a week    Frequency of Social Gatherings with Friends and Family: More than three times a week    Attends Religious Services: Never    Database administrator or Organizations: No    Attends Banker Meetings: Never    Marital Status: Married  Catering manager Violence: Not At Risk (03/15/2021)   Humiliation, Afraid, Rape, and Kick questionnaire    Fear of  Current or Ex-Partner: No    Emotionally Abused: No    Physically Abused: No    Sexually Abused: No    FAMILY HISTORY: Family History  Problem Relation Age of Onset   Congestive Heart Failure Mother    Heart disease Mother    Hypertension Mother    Hypertension Sister    Diabetes Sister    Heart disease Sister    Heart disease Maternal Uncle    Heart disease Maternal Grandmother    Stroke Maternal Grandfather    Cancer Brother        liver, lung   Heart disease Brother    Hypertension Brother    Heart attack Brother    COPD Neg Hx    Breast cancer Neg Hx     ALLERGIES:  is allergic to atorvastatin.  MEDICATIONS:  Current Outpatient Medications  Medication Sig Dispense Refill   acetaminophen (TYLENOL) 500 MG tablet Take 500 mg by mouth every 6 (six) hours as needed for moderate pain.     albuterol (VENTOLIN HFA) 108 (90 Base) MCG/ACT inhaler Inhale 2 puffs into  the lungs every 6 (six) hours as needed for wheezing or shortness of breath. 8 g 6   alendronate (FOSAMAX) 70 MG tablet TAKE 1 TABLET EVERY 7 DAYS . TAKE WITH A FULL GLASS OF WATER ON AN EMPTY STOMACH. 12 tablet 3   clopidogrel (PLAVIX) 75 MG tablet Take 1 tablet (75 mg total) by mouth daily. 90 tablet 1   cyclobenzaprine (FLEXERIL) 10 MG tablet TAKE 1 TABLET AT BEDTIME 90 tablet 3   Fluticasone-Umeclidin-Vilant (TRELEGY ELLIPTA) 100-62.5-25 MCG/ACT AEPB Inhale 1 puff into the lungs daily. 1 each 11   gabapentin (NEURONTIN) 100 MG capsule Take 1 capsule (100 mg total) by mouth 3 (three) times daily. 270 capsule 1   Iron-Vitamin C 65-125 MG TABS Take 1 tablet by mouth daily. 30 tablet 5   levothyroxine (SYNTHROID) 88 MCG tablet Take 1 tablet (88 mcg total) by mouth daily. 90 tablet 3   lidocaine (LIDODERM) 5 % Place 1 patch onto the skin daily. Remove & Discard patch within 12 hours or as directed by MD 90 patch 1   lidocaine-prilocaine (EMLA) cream Apply 1 application topically as needed. 60 g 3   Multiple Vitamin (MULTIVITAMIN) tablet Take 1 tablet by mouth daily.     naproxen (NAPROSYN) 500 MG tablet Take 1 tablet (500 mg total) by mouth at bedtime. 30 tablet 3   omeprazole (PRILOSEC) 20 MG capsule Take 1 capsule (20 mg total) by mouth daily. 90 capsule 3   traMADol (ULTRAM) 50 MG tablet Take 1 tablet (50 mg total) by mouth every 8 (eight) hours as needed. 60 tablet 0   vitamin B-12 (CYANOCOBALAMIN) 500 MCG tablet Take 500 mcg by mouth daily.     ondansetron (ZOFRAN) 8 MG tablet Take 1 tablet (8 mg total) by mouth every 8 (eight) hours as needed for refractory nausea / vomiting. Start on day 3 after chemo. (Patient not taking: Reported on 06/07/2022) 30 tablet 1   prochlorperazine (COMPAZINE) 10 MG tablet Take 1 tablet (10 mg total) by mouth every 6 (six) hours as needed for nausea or vomiting. (Patient not taking: Reported on 06/07/2022) 30 tablet 0   No current facility-administered medications for  this visit.   Facility-Administered Medications Ordered in Other Visits  Medication Dose Route Frequency Provider Last Rate Last Admin   heparin lock flush 100 unit/mL  500 Units Intravenous Once [name], MD  PHYSICAL EXAMINATION: ECOG PERFORMANCE STATUS: 1 - Symptomatic but completely ambulatory Vitals:   08/04/22 1135  BP: 134/65  Pulse: 94  Resp: 16  Temp: 98.1 F (36.7 C)  SpO2: 100%    Filed Weights   08/04/22 1135  Weight: 130 lb (59 kg)     Physical Exam Constitutional:      General: Mary Hoover is not in acute distress. HENT:     Head: Normocephalic and atraumatic.  Eyes:     General: No scleral icterus. Cardiovascular:     Rate and Rhythm: Normal rate.  Pulmonary:     Effort: Pulmonary effort is normal. No respiratory distress.     Breath sounds: No wheezing.     Comments: Decreased breath sound bilaterally  Abdominal:     General: There is no distension.     Palpations: Abdomen is soft.  Musculoskeletal:        General: No deformity. Normal range of motion.     Cervical back: Normal range of motion and neck supple.     Comments: Trace edema bilaterally  Skin:    General: Skin is warm and dry.  Neurological:     Mental Status: Mary Hoover is alert and oriented to person, place, and time. Mental status is at baseline.     Cranial Nerves: No cranial nerve deficit.  Psychiatric:        Mood and Affect: Mood normal.      LABORATORY DATA:  I have reviewed the data as listed    Latest Ref Rng & Units 07/31/2022    8:55 AM 06/07/2022    8:37 AM 05/24/2022    8:06 AM  CBC  WBC 4.0 - 10.5 K/uL 7.1  6.1  5.7   Hemoglobin 12.0 - 15.0 g/dL 16.1  09.6  04.5   Hematocrit 36.0 - 46.0 % 30.7  31.3  31.3   Platelets 150 - 400 K/uL 277  271  225       Latest Ref Rng & Units 07/31/2022    8:55 AM 06/07/2022    8:37 AM 05/24/2022    8:06 AM  CMP  Glucose 70 - 99 mg/dL 409  96  811   BUN 8 - 23 mg/dL 15  15  17    Creatinine 0.44 - 1.00 mg/dL 9.14  7.82  9.56   Sodium  135 - 145 mmol/L 130  132  132   Potassium 3.5 - 5.1 mmol/L 3.7  4.3  4.1   Chloride 98 - 111 mmol/L 100  103  103   CO2 22 - 32 mmol/L 21  21  21    Calcium 8.9 - 10.3 mg/dL 9.2  8.9  8.9   Total Protein 6.5 - 8.1 g/dL 7.1  6.9  7.0   Total Bilirubin 0.3 - 1.2 mg/dL 0.3  0.4  0.5   Alkaline Phos 38 - 126 U/L 69  72  62   AST 15 - 41 U/L 18  24  20    ALT 0 - 44 U/L 11  17  10       Iron/TIBC/Ferritin/ %Sat    Component Value Date/Time   IRON 64 03/01/2022 1007   TIBC 237 (L) 03/01/2022 1007   FERRITIN 90 03/01/2022 1007   IRONPCTSAT 27 03/01/2022 1007      RADIOGRAPHIC STUDIES: I have personally reviewed the radiological images as listed and agreed with the findings in the report. CT Chest W Contrast  Result Date: 08/01/2022 CLINICAL DATA:  Lung cancer; * Tracking Code:  BO * EXAM: CT CHEST WITH CONTRAST TECHNIQUE: Multidetector CT imaging of the chest was performed during intravenous contrast administration. RADIATION DOSE REDUCTION: This exam was performed according to the departmental dose-optimization program which includes automated exposure control, adjustment of the mA and/or kV according to patient size and/or use of iterative reconstruction technique. CONTRAST:  75mL OMNIPAQUE IOHEXOL 300 MG/ML  SOLN COMPARISON:  Multiple priors, most recent chest CT dated May 01, 2022 FINDINGS: Cardiovascular: Normal heart size. No pericardial effusion. Normal caliber thoracic aorta severe atherosclerotic disease. Severe coronary artery calcifications. Left chest wall port with tip in the lower right atrium. Mediastinum/Nodes: Esophagus unremarkable. No enlarged lymph nodes seen in the chest. Lungs/Pleura: Mild tracheal debris. Central airways are patent. Severe centrilobular emphysema. Unchanged biapical pleural-parenchymal scarring. Stable postradiation change of the superior portion of the right lower lobe and right upper lobe. Solid pulmonary nodule of the left lower lobe measuring 5 mm on  series 5, image 99, unchanged when compared with most recent prior, but has increased in size when compared with more remote prior exams for example measured 2 mm on April 25, 2021 prior. No pleural effusion. Upper Abdomen: No acute abnormality. Musculoskeletal: No chest wall abnormality. No acute or significant osseous findings. IMPRESSION: 1. Stable postradiation changes of the right hemithorax. No evidence of recurrent disease. 2. Solid pulmonary nodule of the left lower lobe measuring 5 mm, unchanged when compared with most recent prior, but has increased in size when compared with more remote prior exams. Attention on 3-6 month follow-up is recommended. 3. Aortic Atherosclerosis (ICD10-I70.0) and Emphysema (ICD10-J43.9). Electronically Signed   By: Allegra Lai M.D.   On: 08/01/2022 10:29

## 2022-08-04 NOTE — Assessment & Plan Note (Signed)
Avoid nephrotoxins. Encourage oral hydration.  

## 2022-08-04 NOTE — Assessment & Plan Note (Signed)
Locally advanced cervical cancer stage IIIC1 with pelvic nodes involvement. S/p concurrent chemotherapy and radiation followed by brachytherapy.  Follow-up with gynecology oncology.  She was seen recently, recent pelvic examination reassuring.

## 2022-08-07 ENCOUNTER — Other Ambulatory Visit: Payer: Self-pay

## 2022-08-07 DIAGNOSIS — R197 Diarrhea, unspecified: Secondary | ICD-10-CM

## 2022-08-07 DIAGNOSIS — C3491 Malignant neoplasm of unspecified part of right bronchus or lung: Secondary | ICD-10-CM | POA: Diagnosis not present

## 2022-08-07 LAB — GASTROINTESTINAL PANEL BY PCR, STOOL (REPLACES STOOL CULTURE)

## 2022-08-07 LAB — C DIFFICILE QUICK SCREEN W PCR REFLEX
C Diff antigen: NEGATIVE
C Diff interpretation: NOT DETECTED
C Diff toxin: NEGATIVE

## 2022-08-09 ENCOUNTER — Other Ambulatory Visit: Payer: Self-pay | Admitting: Oncology

## 2022-08-09 MED ORDER — LOPERAMIDE HCL 2 MG PO CAPS: 2.0000 mg | ORAL_CAPSULE | ORAL | 0 refills | Status: DC

## 2022-08-10 ENCOUNTER — Telehealth: Payer: Self-pay

## 2022-08-10 NOTE — Telephone Encounter (Signed)
Spoke to pt and informed her of MD recommendation. She is still having diarrhea but will try Imodium and let us know if it does not work.

## 2022-08-10 NOTE — Telephone Encounter (Signed)
-----   Message from Mary Hoover sent at 08/09/2022  9:47 PM EDT ----- Stool tests are negative.  Recommend imodium PRN. I will send Rx.  Ask patient to call back if diarrhea is not getting better.

## 2022-08-15 ENCOUNTER — Telehealth: Payer: Self-pay | Admitting: Family Medicine

## 2022-08-15 DIAGNOSIS — D2230 Melanocytic nevi of unspecified part of face: Secondary | ICD-10-CM

## 2022-08-15 NOTE — Telephone Encounter (Signed)
Copied from CRM [id number]. Topic: Referral - Request for Referral >> Aug 15, 2022  1:26 PM Patsy Lager T wrote: Has patient seen PCP for this complaint? Yes.   *If NO, is insurance requiring patient see PCP for this issue before PCP can refer them? Referral for which specialty: Dermatologist Preferred provider/office: [facility] at [address] [place] [phone] Reason for referral: spot on her face

## 2022-09-25 ENCOUNTER — Ambulatory Visit (INDEPENDENT_AMBULATORY_CARE_PROVIDER_SITE_OTHER): Payer: Medicare PPO | Admitting: Family Medicine

## 2022-09-25 ENCOUNTER — Encounter: Payer: Self-pay | Admitting: Family Medicine

## 2022-09-25 VITALS — BP 125/75 | HR 105 | Ht 68.0 in | Wt 123.6 lb

## 2022-09-25 DIAGNOSIS — J449 Chronic obstructive pulmonary disease, unspecified: Secondary | ICD-10-CM | POA: Diagnosis not present

## 2022-09-25 DIAGNOSIS — E782 Mixed hyperlipidemia: Secondary | ICD-10-CM | POA: Diagnosis not present

## 2022-09-25 DIAGNOSIS — D631 Anemia in chronic kidney disease: Secondary | ICD-10-CM | POA: Diagnosis not present

## 2022-09-25 DIAGNOSIS — N1831 Chronic kidney disease, stage 3a: Secondary | ICD-10-CM

## 2022-09-25 DIAGNOSIS — G8929 Other chronic pain: Secondary | ICD-10-CM | POA: Diagnosis not present

## 2022-09-25 DIAGNOSIS — I10 Essential (primary) hypertension: Secondary | ICD-10-CM | POA: Diagnosis not present

## 2022-09-25 DIAGNOSIS — E034 Atrophy of thyroid (acquired): Secondary | ICD-10-CM

## 2022-09-25 DIAGNOSIS — M545 Low back pain, unspecified: Secondary | ICD-10-CM

## 2022-09-25 DIAGNOSIS — I700 Atherosclerosis of aorta: Secondary | ICD-10-CM | POA: Diagnosis not present

## 2022-09-25 MED ORDER — ALBUTEROL SULFATE HFA 108 (90 BASE) MCG/ACT IN AERS: 2.0000 | INHALATION_SPRAY | Freq: Four times a day (QID) | RESPIRATORY_TRACT | 6 refills | Status: DC | PRN

## 2022-09-25 MED ORDER — NAPROXEN 500 MG PO TABS: 500.0000 mg | ORAL_TABLET | Freq: Every day | ORAL | 3 refills | Status: DC

## 2022-09-25 MED ORDER — CLOPIDOGREL BISULFATE 75 MG PO TABS: 75.0000 mg | ORAL_TABLET | Freq: Every day | ORAL | 1 refills | Status: DC

## 2022-09-25 MED ORDER — CYCLOBENZAPRINE HCL 10 MG PO TABS: 10.0000 mg | ORAL_TABLET | Freq: Every day | ORAL | 3 refills | Status: DC

## 2022-09-25 MED ORDER — TRAMADOL HCL 50 MG PO TABS: 50.0000 mg | ORAL_TABLET | Freq: Every day | ORAL | 0 refills | Status: DC | PRN

## 2022-09-25 MED ORDER — GABAPENTIN 100 MG PO CAPS: 100.0000 mg | ORAL_CAPSULE | Freq: Three times a day (TID) | ORAL | 1 refills | Status: DC

## 2022-09-25 NOTE — Progress Notes (Signed)
BP 125/75   Pulse (!) 105   Ht 5\' 8"  (1.727 m)   Wt 123 lb 9.6 oz (56.1 kg)   SpO2 95%   BMI 18.79 kg/m    Subjective:    Patient ID: Mary Hoover, female    DOB: 1946-01-06, 77 y.o.   MRN: [id number]  HPI: Mary Hoover is a 77 y.o. female  Chief Complaint  Patient presents with   Hyperlipidemia   Hypertension   Anemia   HYPERTENSION / HYPERLIPIDEMIA Satisfied with current treatment? yes Duration of hypertension: chronic BP monitoring frequency: a few times a week BP medication side effects: no Past BP meds: none Duration of hyperlipidemia: chronic Cholesterol medication side effects: no Cholesterol supplements: none Past cholesterol medications: none Medication compliance: N/A Aspirin: no Recent stressors: no Recurrent headaches: no Visual changes: no Palpitations: no Dyspnea: no Chest pain: no Lower extremity edema: no Dizzy/lightheaded: no  HYPOTHYROIDISM Thyroid control status:controlled Satisfied with current treatment? no Medication side effects: no Medication compliance: excellent compliance Recent dose adjustment:no Fatigue: no Cold intolerance: no Heat intolerance: no Weight gain: no Weight loss: no Constipation: no Diarrhea/loose stools: no Palpitations: no Lower extremity edema: no Anxiety/depressed mood: no  BACK PAIN- has been needing the tramadol every few days Duration:  chronic Mechanism of injury: no trauma Location: bilateral low back Onset: gradual Severity: moderate Quality: aching and sore Frequency: every few days Radiation: none Aggravating factors: movement Alleviating factors: tramadol, rest Status: better Treatments attempted:  tramadol, rest, ice, heat, APAP, ibuprofen, and aleve  Relief with NSAIDs?: mild Nighttime pain:  no Paresthesias / decreased sensation:  no Bowel / bladder incontinence:  no Fevers:  no Dysuria / urinary frequency:  no  Relevant past medical, surgical, family and social  history reviewed and updated as indicated. Interim medical history since our last visit reviewed. Allergies and medications reviewed and updated.  Review of Systems  Constitutional: Negative.   Respiratory: Negative.    Cardiovascular: Negative.   Gastrointestinal: Negative.   Musculoskeletal:  Positive for back pain and myalgias. Negative for arthralgias, gait problem, joint swelling, neck pain and neck stiffness.  Skin: Negative.   Neurological: Negative.   Psychiatric/Behavioral: Negative.      Per HPI unless specifically indicated above     Objective:    BP 125/75   Pulse (!) 105   Ht 5\' 8"  (1.727 m)   Wt 123 lb 9.6 oz (56.1 kg)   SpO2 95%   BMI 18.79 kg/m   Wt Readings from Last 3 Encounters:  09/25/22 123 lb 9.6 oz (56.1 kg)  08/04/22 130 lb (59 kg)  06/14/22 132 lb 11.2 oz (60.2 kg)    Physical Exam Vitals and nursing note reviewed.  Constitutional:      General: She is not in acute distress.    Appearance: Normal appearance. She is not ill-appearing, toxic-appearing or diaphoretic.  HENT:     Head: Normocephalic and atraumatic.     Right Ear: External ear normal.     Left Ear: External ear normal.     Nose: Nose normal.     Mouth/Throat:     Mouth: Mucous membranes are moist.     Pharynx: Oropharynx is clear.  Eyes:     General: No scleral icterus.       Right eye: No discharge.        Left eye: No discharge.     Extraocular Movements: Extraocular movements intact.     Conjunctiva/sclera: Conjunctivae normal.  Pupils: Pupils are equal, round, and reactive to light.  Cardiovascular:     Rate and Rhythm: Normal rate and regular rhythm.     Pulses: Normal pulses.     Heart sounds: Normal heart sounds. No murmur heard.    No friction rub. No gallop.  Pulmonary:     Effort: Pulmonary effort is normal. No respiratory distress.     Breath sounds: Normal breath sounds. No stridor. No wheezing, rhonchi or rales.  Chest:     Chest wall: No tenderness.   Musculoskeletal:        General: Normal range of motion.     Cervical back: Normal range of motion and neck supple.  Skin:    General: Skin is warm and dry.     Capillary Refill: Capillary refill takes less than 2 seconds.     Coloration: Skin is not jaundiced or pale.     Findings: No bruising, erythema, lesion or rash.  Neurological:     General: No focal deficit present.     Mental Status: She is alert and oriented to person, place, and time. Mental status is at baseline.  Psychiatric:        Mood and Affect: Mood normal.        Behavior: Behavior normal.        Thought Content: Thought content normal.        Judgment: Judgment normal.     Results for orders placed or performed in visit on 07/31/22  Gastrointestinal Panel by PCR , Stool  Result Value Ref Range   Campylobacter species NOT DETECTED NOT DETECTED   Plesimonas shigelloides NOT DETECTED NOT DETECTED   Salmonella species NOT DETECTED NOT DETECTED   Yersinia enterocolitica NOT DETECTED NOT DETECTED   Vibrio species NOT DETECTED NOT DETECTED   Vibrio cholerae NOT DETECTED NOT DETECTED   Enteroaggregative E coli (EAEC) NOT DETECTED NOT DETECTED   Enteropathogenic E coli (EPEC) NOT DETECTED NOT DETECTED   Enterotoxigenic E coli (ETEC) NOT DETECTED NOT DETECTED   Shiga like toxin producing E coli (STEC) NOT DETECTED NOT DETECTED   Shigella/Enteroinvasive E coli (EIEC) NOT DETECTED NOT DETECTED   Cryptosporidium NOT DETECTED NOT DETECTED   Cyclospora cayetanensis NOT DETECTED NOT DETECTED   Entamoeba histolytica NOT DETECTED NOT DETECTED   Giardia lamblia NOT DETECTED NOT DETECTED   Adenovirus F40/41 NOT DETECTED NOT DETECTED   Astrovirus NOT DETECTED NOT DETECTED   Norovirus GI/GII NOT DETECTED NOT DETECTED   Rotavirus A NOT DETECTED NOT DETECTED   Sapovirus (I, II, IV, and V) NOT DETECTED NOT DETECTED  C Difficile Quick Screen w PCR reflex  Result Value Ref Range   C Diff antigen NEGATIVE NEGATIVE   C Diff  toxin NEGATIVE NEGATIVE   C Diff interpretation No C. difficile detected.   CMP (Cancer Center only)  Result Value Ref Range   Sodium 130 (L) 135 - 145 mmol/L   Potassium 3.7 3.5 - 5.1 mmol/L   Chloride 100 98 - 111 mmol/L   CO2 21 (L) 22 - 32 mmol/L   Glucose, Bld 109 (H) 70 - 99 mg/dL   BUN 15 8 - 23 mg/dL   Creatinine 2.13 (H) 0.86 - 1.00 mg/dL   Calcium 9.2 8.9 - 57.8 mg/dL   Total Protein 7.1 6.5 - 8.1 g/dL   Albumin 3.8 3.5 - 5.0 g/dL   AST 18 15 - 41 U/L   ALT 11 0 - 44 U/L   Alkaline Phosphatase 69 38 -  126 U/L   Total Bilirubin 0.3 0.3 - 1.2 mg/dL   GFR, Estimated 55 (L) >60 mL/min   Anion gap 9 5 - 15  CBC with Differential (Cancer Center Only)  Result Value Ref Range   WBC Count 7.1 4.0 - 10.5 K/uL   RBC 3.27 (L) 3.87 - 5.11 MIL/uL   Hemoglobin 10.7 (L) 12.0 - 15.0 g/dL   HCT 57.8 (L) 46.9 - 62.9 %   MCV 93.9 80.0 - 100.0 fL   MCH 32.7 26.0 - 34.0 pg   MCHC 34.9 30.0 - 36.0 g/dL   RDW 52.8 41.3 - 24.4 %   Platelet Count 277 150 - 400 K/uL   nRBC 0.0 0.0 - 0.2 %   Neutrophils Relative % 76 %   Neutro Abs 5.5 1.7 - 7.7 K/uL   Lymphocytes Relative 10 %   Lymphs Abs 0.7 0.7 - 4.0 K/uL   Monocytes Relative 9 %   Monocytes Absolute 0.6 0.1 - 1.0 K/uL   Eosinophils Relative 3 %   Eosinophils Absolute 0.2 0.0 - 0.5 K/uL   Basophils Relative 1 %   Basophils Absolute 0.0 0.0 - 0.1 K/uL   Immature Granulocytes 1 %   Abs Immature Granulocytes 0.04 0.00 - 0.07 K/uL      Assessment & Plan:   Problem List Items Addressed This Visit       Cardiovascular and Mediastinum   Essential hypertension    Doing well not on medicine. Continue to monitor. Call with any concerns.       Relevant Orders   Comprehensive metabolic panel   Aortic atherosclerosis (HCC)    Will keep BP and cholesterol under good control. Continue to monitor. Call with any concerns.       Relevant Orders   Comprehensive metabolic panel     Respiratory   COPD (chronic obstructive pulmonary  disease) (HCC)    Under good control on current regimen. Continue current regimen. Continue to monitor. Call with any concerns. Refills given.        Relevant Medications   albuterol (VENTOLIN HFA) 108 (90 Base) MCG/ACT inhaler   Other Relevant Orders   Comprehensive metabolic panel     Endocrine   Hypothyroidism    Rechecking labs today. Await results. Treat as needed.       Relevant Orders   Comprehensive metabolic panel   TSH     Genitourinary   CKD (chronic kidney disease) stage 3, GFR 30-59 ml/min (HCC) (Chronic)    Rechecking labs today. Await results.       Relevant Orders   CBC with Differential/Platelet   Comprehensive metabolic panel   Anemia in chronic kidney disease (CODE) - Primary (Chronic)    Rechecking labs today. Await results. Treat as needed.       Relevant Orders   CBC with Differential/Platelet   Comprehensive metabolic panel     Other   Back pain (Chronic)    OK to take tramadol PRN. 60 pills should last at least 3 months. Call with any concerns.       Relevant Medications   cyclobenzaprine (FLEXERIL) 10 MG tablet   naproxen (NAPROSYN) 500 MG tablet   traMADol (ULTRAM) 50 MG tablet   Hyperlipidemia    Rechecking labs today. Await results. Treat as needed.       Relevant Orders   Comprehensive metabolic panel   Lipid Panel w/o Chol/HDL Ratio     Follow up plan: Return in about 6 months (around 04/04/2023) for  physical.

## 2022-09-25 NOTE — Assessment & Plan Note (Signed)
Rechecking labs today. Await results. Treat as needed.  °

## 2022-09-25 NOTE — Assessment & Plan Note (Signed)
OK to take tramadol PRN. 60 pills should last at least 3 months. Call with any concerns.

## 2022-09-25 NOTE — Assessment & Plan Note (Signed)
Under good control on current regimen. Continue current regimen. Continue to monitor. Call with any concerns. Refills given.   

## 2022-09-25 NOTE — Assessment & Plan Note (Signed)
Will keep BP and cholesterol under good control. Continue to monitor. Call with any concerns.  

## 2022-09-25 NOTE — Assessment & Plan Note (Signed)
Rechecking labs today. Await results.  

## 2022-09-25 NOTE — Assessment & Plan Note (Signed)
Doing well not on medicine. Continue to monitor. Call with any concerns.

## 2022-09-26 ENCOUNTER — Encounter: Payer: Self-pay | Admitting: Family Medicine

## 2022-09-26 ENCOUNTER — Encounter: Payer: Self-pay | Admitting: Oncology

## 2022-09-26 ENCOUNTER — Other Ambulatory Visit: Payer: Self-pay

## 2022-09-26 LAB — CBC WITH DIFFERENTIAL/PLATELET
Basophils Absolute: 0 10*3/uL (ref 0.0–0.2)
Basos: 0 %
EOS (ABSOLUTE): 0.1 10*3/uL (ref 0.0–0.4)
Eos: 1 %
Hematocrit: 37 % (ref 34.0–46.6)
Hemoglobin: 12.7 g/dL (ref 11.1–15.9)
Immature Grans (Abs): 0 10*3/uL (ref 0.0–0.1)
Immature Granulocytes: 0 %
Lymphocytes Absolute: 0.6 10*3/uL — ABNORMAL LOW (ref 0.7–3.1)
Lymphs: 8 %
MCH: 32.6 pg (ref 26.6–33.0)
MCHC: 34.3 g/dL (ref 31.5–35.7)
MCV: 95 fL (ref 79–97)
Monocytes Absolute: 0.4 10*3/uL (ref 0.1–0.9)
Monocytes: 6 %
Neutrophils Absolute: 5.8 10*3/uL (ref 1.4–7.0)
Neutrophils: 85 %
Platelets: 274 10*3/uL (ref 150–450)
RBC: 3.9 x10E6/uL (ref 3.77–5.28)
RDW: 12.7 % (ref 11.7–15.4)
WBC: 6.9 10*3/uL (ref 3.4–10.8)

## 2022-09-26 LAB — LIPID PANEL W/O CHOL/HDL RATIO
Cholesterol, Total: 209 mg/dL — ABNORMAL HIGH (ref 100–199)
HDL: 54 mg/dL (ref >39)
LDL Chol Calc (NIH): 122 mg/dL — ABNORMAL HIGH (ref 0–99)
Triglycerides: 191 mg/dL — ABNORMAL HIGH (ref 0–149)
VLDL Cholesterol Cal: 33 mg/dL (ref 5–40)

## 2022-09-26 LAB — COMPREHENSIVE METABOLIC PANEL
ALT: 14 IU/L (ref 0–32)
AST: 18 IU/L (ref 0–40)
Albumin: 4.2 g/dL (ref 3.8–4.8)
Alkaline Phosphatase: 87 IU/L (ref 44–121)
BUN/Creatinine Ratio: 12 (ref 12–28)
BUN: 12 mg/dL (ref 8–27)
Bilirubin Total: <0.2 mg/dL (ref 0.0–1.2)
CO2: 20 mmol/L (ref 20–29)
Calcium: 9.5 mg/dL (ref 8.7–10.3)
Chloride: 99 mmol/L (ref 96–106)
Creatinine, Ser: 0.97 mg/dL (ref 0.57–1.00)
Globulin, Total: 2.9 g/dL (ref 1.5–4.5)
Glucose: 75 mg/dL (ref 70–99)
Potassium: 4.2 mmol/L (ref 3.5–5.2)
Sodium: 133 mmol/L — ABNORMAL LOW (ref 134–144)
Total Protein: 7.1 g/dL (ref 6.0–8.5)
eGFR: 61 mL/min/{1.73_m2} (ref >59)

## 2022-09-26 LAB — TSH: TSH: 0.761 u[IU]/mL (ref 0.450–4.500)

## 2022-10-10 ENCOUNTER — Other Ambulatory Visit: Payer: Self-pay | Admitting: Family Medicine

## 2022-10-11 NOTE — Telephone Encounter (Signed)
Requested medication (s) are due for refill today: routing for approval  Requested medication (s) are on the active medication list: yes  Last refill:  09/25/22  Future visit scheduled: yes  Notes to clinic:  Unable to refill per protocol, cannot delegate.      Requested Prescriptions  Pending Prescriptions Disp Refills   cyclobenzaprine (FLEXERIL) 10 MG tablet [Pharmacy Med Name: Cyclobenzaprine HCl Oral Tablet 10 MG] 90 tablet 3    Sig: TAKE 1 TABLET AT BEDTIME     Not Delegated - Analgesics:  Muscle Relaxants Failed - 10/10/2022  1:47 AM      Failed - This refill cannot be delegated      Passed - Valid encounter within last 6 months    Recent Outpatient Visits           2 weeks ago Anemia in chronic kidney disease (CODE)   [place] [facility] [place], Megan P, DO   5 months ago Right foot pain   [place] [facility] [place], Megan P, DO   6 months ago Encounter for [employer] annual wellness exam   [place] [facility] [place], Antoine, DO   7 months ago Essential hypertension   [place] [facility] [place], Megan P, DO   11 months ago Cutaneous abscess of neck   [place] [facility] M[place], Oswaldo Conroy, PA-C       Future Appointments             In 5 months Laural Benes, Oralia Rud, DO  [facility]

## 2022-11-09 ENCOUNTER — Ambulatory Visit
Admission: RE | Admit: 2022-11-09 | Discharge: 2022-11-09 | Disposition: A | Discharge status: Home / Self Care | Payer: Medicare PPO | Source: Ambulatory Visit | Attending: Radiation Oncology | Admitting: Radiation Oncology

## 2022-11-09 DIAGNOSIS — C539 Malignant neoplasm of cervix uteri, unspecified: Secondary | ICD-10-CM | POA: Diagnosis not present

## 2022-11-09 DIAGNOSIS — C3490 Malignant neoplasm of unspecified part of unspecified bronchus or lung: Secondary | ICD-10-CM | POA: Insufficient documentation

## 2022-11-09 DIAGNOSIS — J439 Emphysema, unspecified: Secondary | ICD-10-CM | POA: Diagnosis not present

## 2022-11-09 MED ORDER — HEPARIN SOD (PORK) LOCK FLUSH 100 UNIT/ML IV SOLN
500.0000 [IU] | Freq: Once | INTRAVENOUS | Status: AC
  Administered 2022-11-09: 500 [IU] via INTRAVENOUS

## 2022-11-09 MED ORDER — IOHEXOL 300 MG/ML  SOLN
75.0000 mL | Freq: Once | INTRAMUSCULAR | Status: AC | PRN
  Administered 2022-11-09: 75 mL via INTRAVENOUS

## 2022-11-13 ENCOUNTER — Ambulatory Visit: Payer: Medicare PPO | Attending: Radiation Oncology | Admitting: Radiation Oncology

## 2022-11-16 ENCOUNTER — Inpatient Hospital Stay: Payer: Medicare PPO | Attending: Oncology

## 2022-11-16 ENCOUNTER — Inpatient Hospital Stay (HOSPITAL_BASED_OUTPATIENT_CLINIC_OR_DEPARTMENT_OTHER): Payer: Medicare PPO | Admitting: Oncology

## 2022-11-16 ENCOUNTER — Encounter: Payer: Self-pay | Admitting: Oncology

## 2022-11-16 VITALS — BP 143/67 | HR 96 | Temp 97.2°F | Resp 18 | Wt 130.8 lb

## 2022-11-16 DIAGNOSIS — Z95828 Presence of other vascular implants and grafts: Secondary | ICD-10-CM

## 2022-11-16 DIAGNOSIS — N1831 Chronic kidney disease, stage 3a: Secondary | ICD-10-CM

## 2022-11-16 DIAGNOSIS — D631 Anemia in chronic kidney disease: Secondary | ICD-10-CM

## 2022-11-16 DIAGNOSIS — C3491 Malignant neoplasm of unspecified part of right bronchus or lung: Secondary | ICD-10-CM

## 2022-11-16 DIAGNOSIS — Z9221 Personal history of antineoplastic chemotherapy: Secondary | ICD-10-CM | POA: Insufficient documentation

## 2022-11-16 DIAGNOSIS — F17210 Nicotine dependence, cigarettes, uncomplicated: Secondary | ICD-10-CM | POA: Insufficient documentation

## 2022-11-16 DIAGNOSIS — N183 Chronic kidney disease, stage 3 unspecified: Secondary | ICD-10-CM | POA: Insufficient documentation

## 2022-11-16 DIAGNOSIS — Z720 Tobacco use: Secondary | ICD-10-CM | POA: Diagnosis not present

## 2022-11-16 DIAGNOSIS — Z8541 Personal history of malignant neoplasm of cervix uteri: Secondary | ICD-10-CM | POA: Insufficient documentation

## 2022-11-16 DIAGNOSIS — Z923 Personal history of irradiation: Secondary | ICD-10-CM | POA: Diagnosis not present

## 2022-11-16 LAB — CMP (CANCER CENTER ONLY)
ALT: 18 U/L (ref 0–44)
AST: 22 U/L (ref 15–41)
Albumin: 3.6 g/dL (ref 3.5–5.0)
Alkaline Phosphatase: 92 U/L (ref 38–126)
Anion gap: 5 (ref 5–15)
BUN: 8 mg/dL (ref 8–23)
CO2: 22 mmol/L (ref 22–32)
Calcium: 8.6 mg/dL — ABNORMAL LOW (ref 8.9–10.3)
Chloride: 107 mmol/L (ref 98–111)
Creatinine: 0.78 mg/dL (ref 0.44–1.00)
GFR, Estimated: >60 mL/min (ref >60)
Glucose, Bld: 121 mg/dL — ABNORMAL HIGH (ref 70–99)
Potassium: 3.7 mmol/L (ref 3.5–5.1)
Sodium: 134 mmol/L — ABNORMAL LOW (ref 135–145)
Total Bilirubin: 0.4 mg/dL (ref 0.3–1.2)
Total Protein: 7 g/dL (ref 6.5–8.1)

## 2022-11-16 LAB — CBC WITH DIFFERENTIAL (CANCER CENTER ONLY)
Abs Immature Granulocytes: 0.02 10*3/uL (ref 0.00–0.07)
Basophils Absolute: 0 10*3/uL (ref 0.0–0.1)
Basophils Relative: 0 %
Eosinophils Absolute: 0.2 10*3/uL (ref 0.0–0.5)
Eosinophils Relative: 3 %
HCT: 31.7 % — ABNORMAL LOW (ref 36.0–46.0)
Hemoglobin: 10.7 g/dL — ABNORMAL LOW (ref 12.0–15.0)
Immature Granulocytes: 0 %
Lymphocytes Relative: 10 %
Lymphs Abs: 0.6 10*3/uL — ABNORMAL LOW (ref 0.7–4.0)
MCH: 32.4 pg (ref 26.0–34.0)
MCHC: 33.8 g/dL (ref 30.0–36.0)
MCV: 96.1 fL (ref 80.0–100.0)
Monocytes Absolute: 0.5 10*3/uL (ref 0.1–1.0)
Monocytes Relative: 8 %
Neutro Abs: 4.4 10*3/uL (ref 1.7–7.7)
Neutrophils Relative %: 79 %
Platelet Count: 257 10*3/uL (ref 150–400)
RBC: 3.3 MIL/uL — ABNORMAL LOW (ref 3.87–5.11)
RDW: 12.8 % (ref 11.5–15.5)
WBC Count: 5.7 10*3/uL (ref 4.0–10.5)
nRBC: 0 % (ref 0.0–0.2)

## 2022-11-16 MED ORDER — SODIUM CHLORIDE 0.9% FLUSH
10.0000 mL | Freq: Once | INTRAVENOUS | Status: AC
  Administered 2022-11-16: 10 mL via INTRAVENOUS
  Filled 2022-11-16: qty 10

## 2022-11-16 MED ORDER — HEPARIN SOD (PORK) LOCK FLUSH 100 UNIT/ML IV SOLN
500.0000 [IU] | Freq: Once | INTRAVENOUS | Status: AC
  Administered 2022-11-16: 500 [IU] via INTRAVENOUS
  Filled 2022-11-16: qty 5

## 2022-11-16 NOTE — Assessment & Plan Note (Addendum)
Stage III right lung adenocarcinoma,  Recurrent S/p concurrent chemotherapy and radiation- On immunotherapy durvalumab maintenance Labs reviewed and discussed with patient.  CT Oct 2024.- slight increase of subcarinal node. Radiation changes.  repeat CT in 3 months.

## 2022-11-16 NOTE — Assessment & Plan Note (Signed)
Recommend smoke cessation.

## 2022-11-16 NOTE — Assessment & Plan Note (Signed)
Avoid nephrotoxins. Encourage oral hydration.

## 2022-11-16 NOTE — Progress Notes (Signed)
Hematology/Oncology Progress note [phone] [phone]     CHIEF COMPLAINTS/REASON FOR VISIT:  Follow up lung cancer treatments   ASSESSMENT & PLAN:   Cancer Staging  Cervical cancer (HCC) Staging form: Cervix Uteri, AJCC Version 9 - Clinical: FIGO Stage IIICr (cTX, cN1) - Unsigned  Primary lung adenocarcinoma (HCC) Staging form: Lung, AJCC 7th Edition - Clinical stage from 01/12/2021: T1, N1, M0 - Signed by [name], MD on 01/29/2021   Primary lung adenocarcinoma (HCC) Stage III right lung adenocarcinoma,  Recurrent S/p concurrent chemotherapy and radiation- On immunotherapy durvalumab maintenance Labs reviewed and discussed with patient.  CT Oct 2024.- slight increase of subcarinal node. Radiation changes.  repeat CT in 3 months.   Tobacco use Recommend smoke cessation.  CKD (chronic kidney disease) stage 3, GFR 30-59 ml/min (HCC) Avoid nephrotoxins. Encourage oral hydration.    Anemia in chronic kidney disease (CODE) Hemoglobin is stable.  Continue monitor.  Lab Results  Component Value Date   HGB 10.7 (L) 11/16/2022   TIBC 237 (L) 03/01/2022   IRONPCTSAT 27 03/01/2022   FERRITIN 90 03/01/2022     Continue oral iron supplementation.     Orders Placed This Encounter  Procedures   CT Chest W Contrast    Standing Status:   Future    Standing Expiration Date:   11/16/2023    Order Specific Question:   If indicated for the ordered procedure, I authorize the administration of contrast media per Radiology protocol    Answer:   Yes    Order Specific Question:   Does the patient have a contrast media/X-ray dye allergy?    Answer:   No    Order Specific Question:   Preferred imaging location?    Answer:   [facility]   CBC with Differential (Cancer Center Only)    Standing Status:   Future    Standing Expiration Date:   11/16/2023   CMP (Cancer Center only)    Standing Status:   Future    Standing Expiration Date:   11/16/2023    Iron and TIBC    Standing Status:   Future    Standing Expiration Date:   11/16/2023   Ferritin    Standing Status:   Future    Standing Expiration Date:   11/16/2023   Retic Panel    Standing Status:   Future    Standing Expiration Date:   11/16/2023     Follow up 3 months .  All questions were answered. The patient knows to call the clinic with any problems, questions or concerns.  [name], MD, PhD [facility] Hematology Oncology 11/16/2022      HISTORY OF PRESENTING ILLNESS:  # Cervix cancer Patient has developed postmenopausal vaginal bleeding and discharge.  She was noted to have a friable cervix/2 cm mass of the cervix, concerning for malignancy 12/19/2018 endometrial biopsy showed scattered atypical squamous cells suspicious for malignancy.  Predominantly necrosis with associated inflammation. 01/01/2019 initial cervix biopsy showed at least high-grade squamous intraepithelial lesion.-HPV negative 01/22/2019, repeat vaginal wall biopsy and cervix 9:00 biopsy showed squamous cell carcinoma.    Staging images 12/31/2018 showed fluid distending the endometrial canal and or endometrial thickening to the level of cervix.  No evidence of pathological lymphadenopathy.  Haziness about the lower cervix.  7 mm irregular pulmonary nodule within the right upper lobe, suspicious for possible primary or metastatic malignancy.  Chronic pleural-parenchymal scarring/fibrosis at the lung apices.  Large amount of stool.  No bowel  obstruction. 01/08/2019 PET scan showed marked hypermetabolism in the region of the cervix, compatible with the reported history of cervical cancer. Small bilateral pelvic sidewall lymph nodes discernible FDG accumulation, concerning for metastatic disease although neither lymph node is enlarged by CT size criteria. 7 mm nodule in the right upper lobe shows discernible FDG accumulation.  Questionable neoplasm, primary versus metastatic. Emphysema.   # 02/20/2020 -  03/19/2020 concurrent chemotherapy weekly carboplatin and taxol and radiation for treatment of this cervix cancer.   Oncology History  Primary lung adenocarcinoma (HCC)  11/20/2020 Imaging   CT chest with contrast showed minimal residual of previously seen right upper lobe nodule. 0.6 x 0.4 cm. Internal development of mild bandlike radiation fibrosis. Newly enlarged pretracheal lymph node, measuring 2.5 x 1.7 cm. Highly concerning for metastatic disease. Unchanged prominent AP window and the right hilar lymph nodes. Emphysema and diffuse bilateral bronchial wall thickening. Coronary artery disease.    12/13/2020 Imaging   PET scan showed enlarging hypermetabolic right lower paratracheal lymph node, compatible with metastatic disease. SUV 10.3. Mild FDG uptake of the bilateral sacral alla with associated lucency. Concerning for sacral insufficiency fracture. Stable posttreatment changes of the right upper lobe nodule. Nonspecific small solid 3 mm pulmonary nodule of the left lower lobe. 2 small to be characterized. No evidence of FDG avid metastatic disease in the abdomen or pelvis. Aortic atherosclerosis and emphysema.    01/12/2021 Initial Diagnosis   12/2019 lung nodules FDG avid, questionable primary bronchogenic carcinoma versus metastatic cancer.Discussed with radiation oncology. Her case was also discussed on tumor board. Consensus reached on finishing concurrent chemoradiation treatments for cervix cancer first. Possible SBRT to lung nodule for presumed primary lung cancer.  She was evaluated by Dr.Oaks. Options of biopsy via transthoracic or endobronchial approach vs surgical resection. Patient opted to empiric SBRT.  She lost follow up with me until Oct 2022   Primary lung adenocarcinoma [facility]) 11/28/22Patient re establish care with me  for lung cancer management.  01/12/2021 patient underwent bronchoscopy biopsy by Dr. Jayme Cloud Right paratracheal lymph node fine-needle aspiration is  positive for metastatic non-small cell carcinoma, favor adenocarcinoma of lung origin.  Circulogen NGS negative PD-L1, ALK, ROS1, NTRK1/2/3,    01/12/2021 Cancer Staging   Staging form: Lung, AJCC 7th Edition - Clinical stage from 01/12/2021: T1, N1, M0 - Signed by [name], MD on 01/29/2021 Laterality: Right Stage used in treatment planning: Yes National guidelines used in treatment planning: Yes Type of national guideline used in treatment planning: NCCN   02/14/2021 - 03/28/2021 Chemotherapy   Concurrent chemotherapy and Radiation  LUNG Carboplatin / Paclitaxel + XRT q7d      04/26/2021 Imaging   CT chest with contrast showed evolving postradiation changes in the posterior perifissural upper right lung without discrete residual measurable nodule in this location. Right hilar/mediastinal lymphadenopathy stable to decreased. Tiny 0.2 cm left lower lobe pulmonary nodule slightly decreased. Positive response to treatment. No new or progressive disease. Three-vessel coronary arthrosclerosis. Aortic atherosclerosis/emphysema.    05/19/2021 -  Chemotherapy   Patient is on Treatment Plan : LUNG Durvalumab (10) q14d     07/25/2021 Imaging   CT showed 1. Questionable slight enlargement a small left lower lobe nodule measuring up to 5 mm of the sagittal images compared with the most recent prior study. Attention on follow-up in approximately 6 months recommended.2. Otherwise stable chest CT with stable radiation changes superiorly in the right hemithorax and small residual right hilar lymph node.3. Coronary and aortic atherosclerosis (ICD10-I70.0).  Emphysema   10/26/2021 Imaging   CT chest abdomen pelvis w contrast 1. Bandlike consolidative changes in the posterior RIGHT upper lobe appear more organized at the periphery. No focal masslike characteristics. Findings are compatible with evolving post treatment changes. Findings are favored to represent evolving post treatment changes. 2. Nodule in the  LEFT lower lobe a 4 mm may be very slowly increasing in size over time, certainly as compared to March of 2023. Continued attention on follow-up is suggested. 3. Stable 9 mm RIGHT hilar lymph node. 4. Evidence of sacral insufficiency fractures not substantially changed. 5. Pulmonary emphysema and aortic atherosclerosis.     01/31/2022 Imaging   CT chest w contrast 1. Nodular areas developing of bandlike post treatment changes are of uncertain significance. While potentially evolving post treatment changes increasingly nodular foci amidst expected consolidative changes a mildly suspicious. PET imaging or continued short interval follow-up could be considered. 2. New small irregular nodules in the RIGHT lower lobe measuring 3-4 mm with some indistinct adjacent septal thickening. These are nonspecific and could be infectious or inflammatory. Particularly given the presence of material in the RIGHT lower lobe bronchus. 3. Stable 4 mm LEFT lower lobe pulmonary nodule. 4. Three-vessel coronary artery calcification as on previous imaging quite extensive. 5. Emphysema and aortic atherosclerosis.  Aortic Atherosclerosis and Emphysema   02/12/2022 Imaging   CT abdomen pelvis wo contrast Large stool burden throughout the colon suggesting constipation. Aortoiliac atherosclerosis. No acute findings.   08/01/2022 Imaging   CT chest with contrast showed 1. Stable postradiation changes of the right hemithorax. No evidence of recurrent disease. 2. Solid pulmonary nodule of the left lower lobe measuring 5 mm, unchanged when compared with most recent prior, but has increased in size when compared with more remote prior exams. Attention on 3-6 month follow-up is recommended. 3. Aortic Atherosclerosis (ICD10-I70.0) and Emphysema (ICD10-J43.9).     11/09/2022 Imaging   CT chest w contrast  Radiation changes in the right hemithorax. Mild underlying nodularity measuring up to 11 mm, unchanged. This is not  considered overly suspicious for recurrent disease/viable tumor, but warrants continued attention on follow-up.   9 mm short axis subcarinal node, previously 6 mm. Early nodal metastasis is possible. Follow-up CT chest or PET-CT is suggested in 3 months. Aortic Atherosclerosis (ICD10-I70.0) and Emphysema (ICD10-J43.9).   Cervical cancer (HCC)  02/03/2019 Initial Diagnosis   Malignant neoplasm of cervix (HCC)   02/20/2019 - 03/20/2019 Chemotherapy   Patient is on Treatment Plan : Carboplatin q7d w/ XRT     02/14/2021 - 03/28/2021 Chemotherapy   Concurrent chemotherapy and Radiation  LUNG Carboplatin / Paclitaxel + XRT q7d         INTERVAL HISTORY Mary Hoover is a 77 y.o. female who has above history reviewed by me today presents for follow up visit for management of possible recurrent lung cancer.  History of cervix cancer.   Patient feels well. No new complaints.   Review of Systems  Constitutional:  Positive for fatigue. Negative for appetite change, chills, fever and unexpected weight change.  HENT:   Positive for hearing loss. Negative for voice change.   Eyes:  Negative for eye problems.  Respiratory:  Positive for cough. Negative for chest tightness.   Cardiovascular:  Negative for chest pain.  Gastrointestinal:  Negative for abdominal distention, abdominal pain, blood in stool and nausea.  Endocrine: Negative for hot flashes.  Genitourinary:  Negative for difficulty urinating and frequency.   Musculoskeletal:  Negative for arthralgias.  Skin:  Negative for itching and rash.  Neurological:  Negative for extremity weakness.  Hematological:  Negative for adenopathy.  Psychiatric/Behavioral:  Negative for confusion.     MEDICAL HISTORY:  Past Medical History:  Diagnosis Date   Arthritis    Cancer (HCC)    Carotid atherosclerosis    Carotid bruit    Cervical cancer (HCC)    COPD (chronic obstructive pulmonary disease) (HCC)    emphysema   Coronary artery disease     Family history of adverse reaction to anesthesia    paternal grandfather died during surgery-pt unaware what happened   GERD (gastroesophageal reflux disease)    Hyperlipidemia    Hypertension    Hyponatremia 02/27/2019   Hypothyroidism    Infected cat bite 1980s   was hospitalized   Irregular heartbeat    Medical history non-contributory    Peripheral vascular disease (HCC)    Renal insufficiency    Tobacco use     SURGICAL HISTORY: Past Surgical History:  Procedure Laterality Date   CAROTID ENDARTERECTOMY Right Oct. 2015   Dr. Wyn Quaker   CAROTID STENOSIS Left April 2016   carotid stenosis surgery   COLONOSCOPY WITH PROPOFOL N/A 11/08/2016   Procedure: COLONOSCOPY WITH PROPOFOL;  Surgeon: Wyline Mood, MD;  Location: [facility] ENDOSCOPY;  Service: Gastroenterology;  Laterality: N/A;   ESOPHAGOGASTRODUODENOSCOPY (EGD) WITH PROPOFOL N/A 11/08/2016   Procedure: ESOPHAGOGASTRODUODENOSCOPY (EGD) WITH PROPOFOL;  Surgeon: Wyline Mood, MD;  Location: [facility] ENDOSCOPY;  Service: Gastroenterology;  Laterality: N/A;   FLEXIBLE BRONCHOSCOPY N/A 12/17/2014   Procedure: FLEXIBLE BRONCHOSCOPY;  Surgeon: Merwyn Katos, MD;  Location: [facility] ORS;  Service: Pulmonary;  Laterality: N/A;   INCISION AND DRAINAGE / EXCISION THYROGLOSSAL CYST  April 2011   PORTA CATH INSERTION N/A 03/03/2021   Procedure: PORTA CATH INSERTION;  Surgeon: Annice Needy, MD;  Location: [facility] INVASIVE CV LAB;  Service: Cardiovascular;  Laterality: N/A;   VIDEO BRONCHOSCOPY WITH ENDOBRONCHIAL ULTRASOUND N/A 01/12/2021   Procedure: VIDEO BRONCHOSCOPY WITH ENDOBRONCHIAL ULTRASOUND;  Surgeon: Salena Saner, MD;  Location: [facility] ORS;  Service: Cardiopulmonary;  Laterality: N/A;    SOCIAL HISTORY: Social History   Socioeconomic History   Marital status: Married    Spouse name: Rayna Sexton   Number of children: Not on file   Years of education: Not on file   Highest education level: 9th grade  Occupational History   Occupation: retired    Tobacco Use   Smoking status: Every Day    Current packs/day: 0.50    Average packs/day: 0.5 packs/day for 50.0 years (25.0 ttl pk-yrs)    Types: Cigarettes   Smokeless tobacco: Never   Tobacco comments:    0.5 PPD 02/10/2021  Vaping Use   Vaping status: Former  Substance and Sexual Activity   Alcohol use: No    Alcohol/week: 0.0 Hoover drinks of alcohol   Drug use: No   Sexual activity: Yes  Other Topics Concern   Not on file  Social History Narrative   Lives at home with husband   Social Determinants of Health   Financial Resource Strain: Low Risk  (03/15/2021)   Overall Financial Resource Strain (CARDIA)    Difficulty of Paying Living Expenses: Not hard at all  Food Insecurity: No Food Insecurity (03/15/2021)   Hunger Vital Sign    Worried About Running Out of Food in the Last Year: Never true    Ran Out of Food in the Last Year: Never true  Transportation Needs: No Transportation Needs (03/15/2021)  PRAPARE - Administrator, Civil Service (Medical): No    Lack of Transportation (Non-Medical): No  Physical Activity: Inactive (03/15/2021)   Exercise Vital Sign    Days of Exercise per Week: 0 days    Minutes of Exercise per Session: 0 min  Stress: No Stress Concern Present (03/15/2021)   [employer] of Occupational Health - Occupational Stress Questionnaire    Feeling of Stress : Not at all  Social Connections: Moderately Isolated (03/15/2021)   Social Connection and Isolation Panel [NHANES]    Frequency of Communication with Friends and Family: More than three times a week    Frequency of Social Gatherings with Friends and Family: More than three times a week    Attends Religious Services: Never    Database administrator or Organizations: No    Attends Banker Meetings: Never    Marital Status: Married  Catering manager Violence: Not At Risk (03/15/2021)   Humiliation, Afraid, Rape, and Kick questionnaire    Fear of Current or Ex-Partner:  No    Emotionally Abused: No    Physically Abused: No    Sexually Abused: No    FAMILY HISTORY: Family History  Problem Relation Age of Onset   Congestive Heart Failure Mother    Heart disease Mother    Hypertension Mother    Hypertension Sister    Diabetes Sister    Heart disease Sister    Heart disease Maternal Uncle    Heart disease Maternal Grandmother    Stroke Maternal Grandfather    Cancer Brother        liver, lung   Heart disease Brother    Hypertension Brother    Heart attack Brother    COPD Neg Hx    Breast cancer Neg Hx     ALLERGIES:  is allergic to atorvastatin.  MEDICATIONS:  Current Outpatient Medications  Medication Sig Dispense Refill   acetaminophen (TYLENOL) 500 MG tablet Take 500 mg by mouth every 6 (six) hours as needed for moderate pain.     alendronate (FOSAMAX) 70 MG tablet TAKE 1 TABLET EVERY 7 DAYS . TAKE WITH A FULL GLASS OF WATER ON AN EMPTY STOMACH. 12 tablet 3   clopidogrel (PLAVIX) 75 MG tablet Take 1 tablet (75 mg total) by mouth daily. 90 tablet 1   cyclobenzaprine (FLEXERIL) 10 MG tablet Take 1 tablet (10 mg total) by mouth at bedtime. 90 tablet 3   Fluticasone-Umeclidin-Vilant (TRELEGY ELLIPTA) 100-62.5-25 MCG/ACT AEPB Inhale 1 puff into the lungs daily. 1 each 11   gabapentin (NEURONTIN) 100 MG capsule Take 1 capsule (100 mg total) by mouth 3 (three) times daily. 270 capsule 1   Iron-Vitamin C 65-125 MG TABS Take 1 tablet by mouth daily. 30 tablet 5   levothyroxine (SYNTHROID) 88 MCG tablet Take 1 tablet (88 mcg total) by mouth daily. 90 tablet 3   lidocaine (LIDODERM) 5 % Place 1 patch onto the skin daily. Remove & Discard patch within 12 hours or as directed by MD 90 patch 1   lidocaine-prilocaine (EMLA) cream Apply 1 application topically as needed. 60 g 3   Multiple Vitamin (MULTIVITAMIN) tablet Take 1 tablet by mouth daily.     naproxen (NAPROSYN) 500 MG tablet Take 1 tablet (500 mg total) by mouth at bedtime. 30 tablet 3    prochlorperazine (COMPAZINE) 10 MG tablet Take 1 tablet (10 mg total) by mouth every 6 (six) hours as needed for nausea or vomiting. 30 tablet 0  traMADol (ULTRAM) 50 MG tablet Take 1 tablet (50 mg total) by mouth daily as needed. 60 tablet 0   vitamin B-12 (CYANOCOBALAMIN) 500 MCG tablet Take 500 mcg by mouth daily.     loperamide (IMODIUM) 2 MG capsule Take 1 capsule (2 mg total) by mouth See admin instructions. Initial: 4 mg,the 2 mg after each loose bowel movements.  maximum: 16 mg/day (Patient not taking: Reported on 11/16/2022) 60 capsule 0   omeprazole (PRILOSEC) 20 MG capsule Take 1 capsule (20 mg total) by mouth daily. (Patient not taking: Reported on 11/16/2022) 90 capsule 3   ondansetron (ZOFRAN) 8 MG tablet Take 1 tablet (8 mg total) by mouth every 8 (eight) hours as needed for refractory nausea / vomiting. Start on day 3 after chemo. (Patient not taking: Reported on 11/16/2022) 30 tablet 1   No current facility-administered medications for this visit.   Facility-Administered Medications Ordered in Other Visits  Medication Dose Route Frequency Provider Last Rate Last Admin   heparin lock flush 100 unit/mL  500 Units Intravenous Once [name], MD         PHYSICAL EXAMINATION: ECOG PERFORMANCE STATUS: 1 - Symptomatic but completely ambulatory Vitals:   11/16/22 1107  BP: (!) 143/67  Pulse: 96  Resp: 18  Temp: (!) 97.2 F (36.2 C)  SpO2: 100%    Filed Weights   11/16/22 1107  Weight: 130 lb 12.8 oz (59.3 kg)     Physical Exam Constitutional:      General: She is not in acute distress. HENT:     Head: Normocephalic and atraumatic.  Eyes:     General: No scleral icterus. Cardiovascular:     Rate and Rhythm: Normal rate.  Pulmonary:     Effort: Pulmonary effort is normal. No respiratory distress.     Breath sounds: No wheezing.     Comments: Decreased breath sound bilaterally  Abdominal:     General: There is no distension.     Palpations: Abdomen is soft.   Musculoskeletal:        General: No deformity. Normal range of motion.     Cervical back: Normal range of motion and neck supple.     Comments: Trace edema bilaterally  Skin:    General: Skin is warm and dry.  Neurological:     Mental Status: She is alert and oriented to person, place, and time. Mental status is at baseline.     Cranial Nerves: No cranial nerve deficit.  Psychiatric:        Mood and Affect: Mood normal.      LABORATORY DATA:  I have reviewed the data as listed    Latest Ref Rng & Units 11/16/2022   10:53 AM 09/25/2022    1:38 PM 07/31/2022    8:55 AM  CBC  WBC 4.0 - 10.5 K/uL 5.7  6.9  7.1   Hemoglobin 12.0 - 15.0 g/dL 16.1  09.6  04.5   Hematocrit 36.0 - 46.0 % 31.7  37.0  30.7   Platelets 150 - 400 K/uL 257  274  277       Latest Ref Rng & Units 11/16/2022   10:53 AM 09/25/2022    1:38 PM 07/31/2022    8:55 AM  CMP  Glucose 70 - 99 mg/dL 409  75  811   BUN 8 - 23 mg/dL 8  12  15    Creatinine 0.44 - 1.00 mg/dL 9.14  7.82  9.56   Sodium 135 - 145 mmol/L  134  133  130   Potassium 3.5 - 5.1 mmol/L 3.7  4.2  3.7   Chloride 98 - 111 mmol/L 107  99  100   CO2 22 - 32 mmol/L 22  20  21    Calcium 8.9 - 10.3 mg/dL 8.6  9.5  9.2   Total Protein 6.5 - 8.1 g/dL 7.0  7.1  7.1   Total Bilirubin 0.3 - 1.2 mg/dL 0.4  <1.6  0.3   Alkaline Phos 38 - 126 U/L 92  87  69   AST 15 - 41 U/L 22  18  18    ALT 0 - 44 U/L 18  14  11       Iron/TIBC/Ferritin/ %Sat    Component Value Date/Time   IRON 64 03/01/2022 1007   TIBC 237 (L) 03/01/2022 1007   FERRITIN 90 03/01/2022 1007   IRONPCTSAT 27 03/01/2022 1007      RADIOGRAPHIC STUDIES: I have personally reviewed the radiological images as listed and agreed with the findings in the report. CT Chest W Contrast  Result Date: 11/10/2022 CLINICAL DATA:  Follow-up lung cancer, status post chemotherapy and radiation. History of cervical cancer. EXAM: CT CHEST WITH CONTRAST TECHNIQUE: Multidetector CT imaging of the chest  was performed during intravenous contrast administration. RADIATION DOSE REDUCTION: This exam was performed according to the departmental dose-optimization program which includes automated exposure control, adjustment of the mA and/or kV according to patient size and/or use of iterative reconstruction technique. CONTRAST:  75mL OMNIPAQUE IOHEXOL 300 MG/ML  SOLN COMPARISON:  07/31/2022 FINDINGS: Cardiovascular: The heart is normal in size. No pericardial effusion. No evidence of thoracic aortic aneurysm. Atherosclerotic calcifications of the aortic arch. Moderate three-vessel coronary atherosclerosis. Left chest port terminates in the right upper atrium. Mediastinum/Nodes: 9 mm short axis subcarinal node (series 2/image 35), previously 6 mm. Visualized thyroid is unremarkable. Lungs/Pleura: Radiation changes the posterior right upper lobe and superior segment right lower lobe (series 3/image 2). Mild underlying nodularity measuring up to 11 mm (series 3/image 3), unchanged. 5 mm subpleural nodule in the left lower lobe (series 3/image 98), unchanged. Moderate centrilobular and paraseptal emphysematous changes, upper lung predominant. Biapical pleural-parenchymal scarring. No focal consolidation. No pleural effusion or pneumothorax. Upper Abdomen: Visualized upper abdomen is grossly unremarkable, noting vascular calcifications. Musculoskeletal: Mild degenerative changes of the visualized thoracolumbar spine. IMPRESSION: Radiation changes in the right hemithorax. Mild underlying nodularity measuring up to 11 mm, unchanged. This is not considered overly suspicious for recurrent disease/viable tumor, but warrants continued attention on follow-up. 9 mm short axis subcarinal node, previously 6 mm. Early nodal metastasis is possible. Follow-up CT chest or PET-CT is suggested in 3 months. Aortic Atherosclerosis (ICD10-I70.0) and Emphysema (ICD10-J43.9). Electronically Signed   By: Charline Bills M.D.   On: 11/10/2022  11:28

## 2022-11-16 NOTE — Assessment & Plan Note (Addendum)
Hemoglobin is stable.  Continue monitor.  Lab Results  Component Value Date   HGB 10.7 (L) 11/16/2022   TIBC 237 (L) 03/01/2022   IRONPCTSAT 27 03/01/2022   FERRITIN 90 03/01/2022     Continue oral iron supplementation.

## 2022-11-22 ENCOUNTER — Other Ambulatory Visit: Payer: Self-pay | Admitting: Family Medicine

## 2022-11-23 NOTE — Telephone Encounter (Signed)
Requested medication (s) are due for refill today: Yes  Requested medication (s) are on the active medication list: Yes  Last refill:  02/10/22  Future visit scheduled: No  Notes to clinic:  Protocol indicates lab work is needed.    Requested Prescriptions  Pending Prescriptions Disp Refills   alendronate (FOSAMAX) 70 MG tablet [Pharmacy Med Name: Alendronate Sodium Oral Tablet 70 MG] 12 tablet 3    Sig: TAKE 1 TABLET EVERY 7 DAYS . TAKE WITH A FULL GLASS OF WATER ON AN EMPTY STOMACH.     Endocrinology:  Bisphosphonates Failed - 11/22/2022 10:25 AM      Failed - Ca in normal range and within 360 days    Calcium  Date Value Ref Range Status  11/16/2022 8.6 (L) 8.9 - 10.3 mg/dL Final   Calcium, Total  Date Value Ref Range Status  05/14/2014 8.7 (L) mg/dL Final    Comment:    4.0-98.1 NOTE: New Reference Range  04/07/14          Failed - Vitamin D in normal range and within 360 days    No results found for: "XB1478GN5", "AO1308MV7", "VD125OH2TOT", "25OHVITD3", "25OHVITD2", "25OHVITD1", "VD25OH"       Failed - Mg Level in normal range and within 360 days    No results found for: "MG"       Failed - Phosphate in normal range and within 360 days    No results found for: "PHOS"       Passed - Cr in normal range and within 360 days    Creatinine  Date Value Ref Range Status  11/16/2022 0.78 0.44 - 1.00 mg/dL Final  84/69/6295 2.84 mg/dL Final    Comment:    1.32-4.40 NOTE: New Reference Range  04/07/14          Passed - eGFR is 30 or above and within 360 days    EGFR (African American)  Date Value Ref Range Status  05/14/2014 >60  Final   GFR calc Af Amer  Date Value Ref Range Status  11/14/2019 64 >59 mL/min/1.73 Final    Comment:    **In accordance with recommendations from the NKF-ASN Task force,**   [facility] is in the process of updating its eGFR calculation to the   2021 CKD-EPI creatinine equation that estimates kidney function   without a race  variable.    EGFR (Non-African Amer.)  Date Value Ref Range Status  05/14/2014 >60  Final    Comment:    eGFR values <7mL/min/1.73 m2 may be an indication of chronic kidney disease (CKD). Calculated eGFR is useful in patients with stable renal function. The eGFR calculation will not be reliable in acutely ill patients when serum creatinine is changing rapidly. It is not useful in patients on dialysis. The eGFR calculation may not be applicable to patients at the low and high extremes of body sizes, pregnant women, and vegetarians.    GFR, Estimated  Date Value Ref Range Status  11/16/2022 >60 >60 mL/min Final    Comment:    (NOTE) Calculated using the CKD-EPI Creatinine Equation (2021)    eGFR  Date Value Ref Range Status  09/25/2022 61 >59 mL/min/1.73 Final         Passed - Valid encounter within last 12 months    Recent Outpatient Visits           1 month ago Anemia in chronic kidney disease (CODE)   [place] [facility] [place], Megan P, DO  7 months ago Right foot pain   [place] [facility] [place], Megan P, DO   7 months ago Encounter for [employer] annual wellness exam   [place] [facility] [place], [place], DO   9 months ago Essential hypertension   [place] [facility] [place], Leighton, DO   1 year ago Cutaneous abscess of neck   [place] [facility] [place], Oswaldo Conroy, PA-C       Future Appointments             In 4 months Laural Benes, Oralia Rud, DO [place] [facility]            Passed - Bone Mineral Density or Dexa Scan completed in the last 2 years

## 2022-12-04 ENCOUNTER — Other Ambulatory Visit: Payer: Self-pay | Admitting: Family Medicine

## 2022-12-04 NOTE — Telephone Encounter (Signed)
Medication Refill - Medication: alendronate (FOSAMAX) 70 MG tablet  Has the patient contacted their pharmacy? Yes.    (Preferred Pharmacy (with phone number or street name):  [facility] - [place], [place] - [address]  [address] [place] [address]  Phone:  [phone]  Fax:  [phone]  DEA #:  -- [id number] Reason: --     Has the patient been seen for an appointment in the last year OR does the patient have an upcoming appointment? Yes.    Agent: Please be advised that RX refills may take up to 3 business days. We ask that you follow-up with your pharmacy.

## 2022-12-05 NOTE — Telephone Encounter (Signed)
Requested Prescriptions  Pending Prescriptions Disp Refills   alendronate (FOSAMAX) 70 MG tablet 12 tablet 3    Sig: Take with a full glass of water on an empty stomach.     Endocrinology:  Bisphosphonates Failed - 12/04/2022  3:10 PM      Failed - Ca in normal range and within 360 days    Calcium  Date Value Ref Range Status  11/16/2022 8.6 (L) 8.9 - 10.3 mg/dL Final   Calcium, Total  Date Value Ref Range Status  05/14/2014 8.7 (L) mg/dL Final    Comment:    6.4-33.2 NOTE: New Reference Range  04/07/14          Failed - Vitamin D in normal range and within 360 days    No results found for: "[id number]", "[id number]", "VD125OH2TOT", "25OHVITD3", "25OHVITD2", "25OHVITD1", "VD25OH"       Failed - Mg Level in normal range and within 360 days    No results found for: "MG"       Failed - Phosphate in normal range and within 360 days    No results found for: "PHOS"       Passed - Cr in normal range and within 360 days    Creatinine  Date Value Ref Range Status  11/16/2022 0.78 0.44 - 1.00 mg/dL Final  93/23/5573 2.20 mg/dL Final    Comment:    2.54-2.70 NOTE: New Reference Range  04/07/14          Passed - eGFR is 30 or above and within 360 days    EGFR (African American)  Date Value Ref Range Status  05/14/2014 >60  Final   GFR calc Af Amer  Date Value Ref Range Status  11/14/2019 64 >59 mL/min/1.73 Final    Comment:    **In accordance with recommendations from the NKF-ASN Task force,**   [facility] is in the process of updating its eGFR calculation to the   2021 CKD-EPI creatinine equation that estimates kidney function   without a race variable.    EGFR (Non-African Amer.)  Date Value Ref Range Status  05/14/2014 >60  Final    Comment:    eGFR values <12mL/min/1.73 m2 may be an indication of chronic kidney disease (CKD). Calculated eGFR is useful in patients with stable renal function. The eGFR calculation will not be reliable in acutely ill patients when  serum creatinine is changing rapidly. It is not useful in patients on dialysis. The eGFR calculation may not be applicable to patients at the low and high extremes of body sizes, pregnant women, and vegetarians.    GFR, Estimated  Date Value Ref Range Status  11/16/2022 >60 >60 mL/min Final    Comment:    (NOTE) Calculated using the CKD-EPI Creatinine Equation (2021)    eGFR  Date Value Ref Range Status  09/25/2022 61 >59 mL/min/1.73 Final         Passed - Valid encounter within last 12 months    Recent Outpatient Visits           2 months ago Anemia in chronic kidney disease (CODE)   [place] [facility] [place], Megan P, DO   7 months ago Right foot pain   [place] [facility] [place], Megan P, DO   8 months ago Encounter for [employer] annual wellness exam   [place] [facility] [place], Megan P, DO   9 months ago Essential hypertension    [facility] [place], [place], DO  1 year ago Cutaneous abscess of neck   [place] [facility] [place], Oswaldo Conroy, PA-C       Future Appointments             In 3 months Laural Benes, Oralia Rud, DO [place] [facility]            Passed - Bone Mineral Density or Dexa Scan completed in the last 2 years

## 2022-12-06 ENCOUNTER — Inpatient Hospital Stay: Payer: Medicare PPO | Attending: Oncology | Admitting: Licensed Clinical Social Worker

## 2022-12-06 DIAGNOSIS — Z8541 Personal history of malignant neoplasm of cervix uteri: Secondary | ICD-10-CM | POA: Insufficient documentation

## 2022-12-06 DIAGNOSIS — Z923 Personal history of irradiation: Secondary | ICD-10-CM | POA: Insufficient documentation

## 2022-12-06 DIAGNOSIS — C3491 Malignant neoplasm of unspecified part of right bronchus or lung: Secondary | ICD-10-CM | POA: Insufficient documentation

## 2022-12-06 DIAGNOSIS — Z9221 Personal history of antineoplastic chemotherapy: Secondary | ICD-10-CM | POA: Insufficient documentation

## 2022-12-06 NOTE — Progress Notes (Signed)
[facility] CSW Progress Note  Clinical Child psychotherapist contacted patient by phone to invite her and her great granddaughter to the [place] event.  CSW to mail flyer with details regarding the event.  She said she will speak with her about it.  Patient reports doing well with no other needs or issues at this time.    Dorothey Baseman, LCSW Clinical Social Worker [facility]

## 2022-12-11 ENCOUNTER — Other Ambulatory Visit: Payer: Self-pay | Admitting: Family Medicine

## 2022-12-12 NOTE — Telephone Encounter (Signed)
Requested medications are due for refill today.  yes  Requested medications are on the active medications list.  yes  Last refill. 09/25/2022 #30 3 rf  Future visit scheduled.   yes  Notes to clinic.  Abnormal labs.    Requested Prescriptions  Pending Prescriptions Disp Refills   naproxen (NAPROSYN) 500 MG tablet [Pharmacy Med Name: Naproxen Oral Tablet 500 MG] 90 tablet 3    Sig: TAKE 1 TABLET AT BEDTIME     Analgesics:  NSAIDS Failed - 12/11/2022 10:16 AM      Failed - Manual Review: Labs are only required if the patient has taken medication for more than 8 weeks.      Failed - HGB in normal range and within 360 days    Hemoglobin  Date Value Ref Range Status  11/16/2022 10.7 (L) 12.0 - 15.0 g/dL Final  35/57/3220 25.4 11.1 - 15.9 g/dL Final         Failed - HCT in normal range and within 360 days    HCT  Date Value Ref Range Status  11/16/2022 31.7 (L) 36.0 - 46.0 % Final   Hematocrit  Date Value Ref Range Status  09/25/2022 37.0 34.0 - 46.6 % Final         Passed - Cr in normal range and within 360 days    Creatinine  Date Value Ref Range Status  11/16/2022 0.78 0.44 - 1.00 mg/dL Final  27/07/2374 2.83 mg/dL Final    Comment:    1.51-7.61 NOTE: New Reference Range  04/07/14          Passed - PLT in normal range and within 360 days    Platelets  Date Value Ref Range Status  09/25/2022 274 150 - 450 x10E3/uL Final   Platelet Count  Date Value Ref Range Status  11/16/2022 257 150 - 400 K/uL Final         Passed - eGFR is 30 or above and within 360 days    EGFR (African American)  Date Value Ref Range Status  05/14/2014 >60  Final   GFR calc Af Amer  Date Value Ref Range Status  11/14/2019 64 >59 mL/min/1.73 Final    Comment:    **In accordance with recommendations from the NKF-ASN Task force,**   [facility] is in the process of updating its eGFR calculation to the   2021 CKD-EPI creatinine equation that estimates kidney function   without a race  variable.    EGFR (Non-African Amer.)  Date Value Ref Range Status  05/14/2014 >60  Final    Comment:    eGFR values <4mL/min/1.73 m2 may be an indication of chronic kidney disease (CKD). Calculated eGFR is useful in patients with stable renal function. The eGFR calculation will not be reliable in acutely ill patients when serum creatinine is changing rapidly. It is not useful in patients on dialysis. The eGFR calculation may not be applicable to patients at the low and high extremes of body sizes, pregnant women, and vegetarians.    GFR, Estimated  Date Value Ref Range Status  11/16/2022 >60 >60 mL/min Final    Comment:    (NOTE) Calculated using the CKD-EPI Creatinine Equation (2021)    eGFR  Date Value Ref Range Status  09/25/2022 61 >59 mL/min/1.73 Final         Passed - Patient is not pregnant      Passed - Valid encounter within last 12 months    Recent Outpatient Visits  2 months ago Anemia in chronic kidney disease (CODE)   [place] [facility] [place], Megan P, DO   7 months ago Right foot pain   [place] [facility] [place], Megan P, DO   8 months ago Encounter for [employer] annual wellness exam   [place] [facility] [place], [place], DO   9 months ago Essential hypertension   [place] [facility] [place], Megan P, DO   1 year ago Cutaneous abscess of neck   [place] [facility] M[place], Oswaldo Conroy, PA-C       Future Appointments             In 3 months Laural Benes, Oralia Rud, DO [place] [facility]

## 2022-12-20 ENCOUNTER — Other Ambulatory Visit: Payer: Self-pay | Admitting: Radiation Oncology

## 2022-12-20 ENCOUNTER — Inpatient Hospital Stay: Payer: Medicare PPO | Admitting: Obstetrics and Gynecology

## 2022-12-20 ENCOUNTER — Ambulatory Visit
Admission: RE | Admit: 2022-12-20 | Discharge: 2022-12-20 | Disposition: A | Discharge status: Home / Self Care | Payer: Medicare PPO | Source: Ambulatory Visit | Attending: Radiation Oncology | Admitting: Radiation Oncology

## 2022-12-20 VITALS — BP 138/71 | HR 88 | Temp 97.6°F | Wt 132.0 lb

## 2022-12-20 VITALS — BP 137/71 | HR 88 | Temp 97.6°F | Resp 20 | Wt 132.1 lb

## 2022-12-20 DIAGNOSIS — Z8541 Personal history of malignant neoplasm of cervix uteri: Secondary | ICD-10-CM | POA: Diagnosis not present

## 2022-12-20 DIAGNOSIS — C3480 Malignant neoplasm of overlapping sites of unspecified bronchus and lung: Secondary | ICD-10-CM | POA: Insufficient documentation

## 2022-12-20 DIAGNOSIS — Z923 Personal history of irradiation: Secondary | ICD-10-CM | POA: Diagnosis not present

## 2022-12-20 DIAGNOSIS — C3491 Malignant neoplasm of unspecified part of right bronchus or lung: Secondary | ICD-10-CM | POA: Diagnosis not present

## 2022-12-20 DIAGNOSIS — Z08 Encounter for follow-up examination after completed treatment for malignant neoplasm: Secondary | ICD-10-CM

## 2022-12-20 DIAGNOSIS — C539 Malignant neoplasm of cervix uteri, unspecified: Secondary | ICD-10-CM

## 2022-12-20 DIAGNOSIS — Z9221 Personal history of antineoplastic chemotherapy: Secondary | ICD-10-CM | POA: Diagnosis not present

## 2022-12-20 DIAGNOSIS — C3490 Malignant neoplasm of unspecified part of unspecified bronchus or lung: Secondary | ICD-10-CM | POA: Diagnosis not present

## 2022-12-20 NOTE — Progress Notes (Signed)
Gynecologic Oncology Interval Visit  [facility]  [phone] [phone]  Patient Care Team: Dorcas Carrow, DO as PCP - General (Family Medicine) Benita Gutter, RN as Oncology Nurse Navigator Carmina Miller, MD as Radiation Oncologist (Radiation Oncology) Glory Buff, RN as Oncology Nurse Navigator [name], MD as Consulting Physician (Oncology)   Name of the patient: Mary Hoover  [id number]  12/10/1945   Date of visit: 12/20/2022  Referring Provider: Dr. Almon Hercules Ob-Gyn  Chief Complaint: Stage IIIC squamous Cell Cervical Cancer, surveillance  Subjective:  Mary Hoover is a 77 y.o. female initially diagnosed with stage IIIc cervical squamous cell carcinoma s/p concurrent carboplatin (02/20/19-03/20/19) & radiation (02/17/2019- 03/24/19) followed by vaginal brachytherapy at [facility] completed 04/11/2019 with history of NSCLC s/p radiation who returns to clinic for follow-up and continued surveillance.  Patient's last pelvic exam was Nov 2023. 12/14/2021 Pap NILM/HRHPV negative  1/24 CT scan for abdominal pain just showed constipation.   No bleeding or pain.  On maintenance immunotherapy for recurrent lung cancer.  It sounds like they may stop her maintenance therapy if her next imaging scan is reassuring.  Gynecologic Oncology History  Mary Hoover is a pleasant y.o. female who is seen in consultation from Dr. Laural Benes for new diagnosis of probable cervical cancer. Patient initially presented to her PCP with postmenopausal vaginal bleeding.  She was referred to Dr. Jean Rosenthal at [facility].  Last menstrual period around age 75.  On exam, cervix was friable and firm to palpation extending anteriorly.  Urethral caruncle also noted.  Speculum exam caused some bleeding that required silver nitrate.  Pap and endometrial biopsy were performed.  Endometrial biopsy returned some exudative material.  Findings are concerning for  neoplastic process.  Pap- 12/19/2018 - Squamous cell Carcinoma - High Risk HPV - negative  12/19/2018- Endometrial biopsy -  Scattered atypical squamous cells suspicious for malignancy  -  Predominantly necrosis with associated inflammation   CT Chest/Abdomen/Pelvis on 12/31/2018 for staging: Fluid distending the endometrial canal and/or endometrial thickening to the level of the cervix.  No adnexal mass or free fluid seen.  Haziness about the cervix of uncertain significance.  Chronic pleuroparenchymal scarring/fibrosis at lung apices, irregular pulmonary nodule within the Right upper lobe measuring 7mm, moderate emphysema.   01/08/2019 Cervical biopsy. DIAGNOSIS:  A. CERVIX; BIOPSY:  - AT LEAST HIGH-GRADE SQUAMOUS INTRAEPITHELIAL LESION (HSIL/CIN3).  Comment: Invasion is not definitively identified in this sample.  Numerous step sections are examined.  Given the exophytic growth pattern of the lesion, an underlying invasive component cannot be excluded. The diagnosis of squamous cell carcinoma on recent pap smear is noted. Immunohistochemical stain for p16 is negative.   01/08/2019 PET IMPRESSION:1. Marked hypermetabolism in the region of the cervix, compatible with the reported history of cervical cancer. 2. Small bilateral pelvic sidewall lymph nodes show discernible FDG accumulation, concerning for metastatic disease although neither lymph node is enlarged by CT size criteria. 3. 7 mm nodule in the right upper lobe shows discernible FDG accumulation. Given the discernible uptake in a tiny lung nodule of this size, neoplasm is a distinct consideration. Primary bronchogenic carcinoma or metastatic disease could have this appearance. 4.  Aortic Atherosclerois (ICD10-170.0) 5.  Emphysema. ([id number])  Stage IIIc cervical squamous cell carcinoma s/p concurrent carboplatin (02/20/19-03/20/19) & radiation (02/17/2019- 03/24/19) followed by vaginal brachytherapy at [facility] completed 04/11/2019.  NED  Lung cancer history: 04/22/2019- CT Chest WO Contrast revealed stable right upper lobe lung nodule measuring 7mm, previously given  the discernible uptake on PET from 01/08/2019, in a tiny lung nodule of this size, neoplasm is a distinct consideration. Primary bronchogenic carcinoma or metastatic disease could have this appearance. Recommend continued serial follow up to ensure stability.   11/27/19 PET. # Lung Nodule/NSCLC- FDG avid on PET. Reluctant to undergo surgery. Elected for empiric SBRT completed 2021.   11/20/2020, CT chest with contrast showed minimal residual of previously seen right upper lobe nodule.  0.6 x 0.4 cm.  Internal development of mild bandlike radiation fibrosis.  Newly enlarged pretracheal lymph node, measuring 2.5 x 1.7 cm.  Highly concerning for metastatic disease.  Unchanged prominent AP window and the right hilar lymph nodes.  Emphysema and diffuse bilateral bronchial wall thickening.  Coronary artery disease. 12/13/2020, PET scan showed enlarging hypermetabolic right lower paratracheal lymph node, compatible with metastatic disease.  SUV 10.3.  Mild FDG uptake of the bilateral sacral alla with associated lucency.  Concerning for sacral insufficiency fracture.  Stable posttreatment changes of the right upper lobe nodule.  Nonspecific small solid 3 mm pulmonary nodule of the left lower lobe.  2 small to be characterized.  No evidence of FDG avid metastatic disease in the abdomen or pelvis.  Aortic atherosclerosis and emphysema.   Patient reestablish care with Dr Cathie Hoops on 12/27/2020 for lung cancer treatments    01/12/2021 patient underwent bronchoscopy biopsy by Dr. Jayme Cloud Right paratracheal lymph node fine-needle aspiration is positive for metastatic non-small cell carcinoma, favor adenocarcinoma of lung origin.   Circulogen NGS negative PD-L1, ALK, ROS1, NTRK1/2/3,   02/14/2021-03/31/2021, concurrent carboplatin AUC 2 and taxol 45mg /m2 with radiation.   04/26/2021, CT  chest with contrast showed evolving postradiation changes in the posterior perifissural upper right lung without discrete residual measurable nodule in this location.  Right hilar/mediastinal lymphadenopathy stable to decreased.  Tiny 0.2 cm left lower lobe pulmonary nodule slightly decreased.  Positive response to treatment.  No new or progressive disease.  Three-vessel coronary arthrosclerosis.  Aortic atherosclerosis/emphysema. -concurrent chemotherapy and radiation-immunotherapy durvalumab maintenance.   Also s/p carotid end arterectomy for carotid stenosis.  Oncology History  Cervical cancer (HCC)  02/03/2019 Initial Diagnosis   Malignant neoplasm of cervix (HCC)   02/20/2019 -  Chemotherapy   The patient had palonosetron (ALOXI) injection 0.25 mg, 0.25 mg, Intravenous,  Once, 5 of 5 cycles Administration: 0.25 mg (02/20/2019), 0.25 mg (02/27/2019), 0.25 mg (03/06/2019), 0.25 mg (03/13/2019), 0.25 mg (03/20/2019) CARBOplatin (PARAPLATIN) 150 mg in sodium chloride 0.9 % 100 mL chemo infusion, 150 mg (100 % of original dose 152.8 mg), Intravenous,  Once, 5 of 5 cycles Dose modification:   (original dose 152.8 mg, Cycle 1) Administration: 150 mg (02/20/2019), 150 mg (02/27/2019), 150 mg (03/06/2019), 150 mg (03/13/2019), 150 mg (03/20/2019)  for chemotherapy treatment.     Problem List: Patient Active Problem List   Diagnosis Date Noted   Diarrhea 08/04/2022   Encounter for antineoplastic immunotherapy 05/10/2022   Localized osteoporosis without current pathological fracture 04/03/2022   Nausea without vomiting 01/04/2022   Weight loss 01/04/2022   Skin abscess 11/04/2021   Neoplasm related pain 10/21/2021   Back pain 10/21/2021   Anemia in chronic kidney disease (CODE) 10/07/2021   CKD (chronic kidney disease) stage 3, GFR 30-59 ml/min (HCC) 09/24/2021   Advanced directives, counseling/discussion 05/19/2021   Hypocalcemia 03/21/2021   Anemia due to antineoplastic chemotherapy 03/07/2021    Encounter for antineoplastic chemotherapy 02/21/2021   Aortic atherosclerosis (HCC) 01/04/2021   Paratracheal lymphadenopathy 12/27/2020   Osteoarthritis of spine with  radiculopathy, lumbar region 06/10/2020   Hyponatremia 02/27/2019   Cervical cancer (HCC) 02/03/2019   Goals of care, counseling/discussion 02/03/2019   Carotid atherosclerosis 11/14/2016   Myopia of both eyes 01/11/2016   Primary lung adenocarcinoma (HCC)    Essential hypertension 12/12/2014   Hemoptysis 12/11/2014   Hyperlipidemia    Status post carotid endarterectomy    Tobacco use    COPD (chronic obstructive pulmonary disease) (HCC) 10/13/2013   Hypothyroidism 09/15/2013   Peripheral vascular disease (HCC) 09/15/2013   Past Medical History: Past Medical History:  Diagnosis Date   Arthritis    Cancer (HCC)    Carotid atherosclerosis    Carotid bruit    Cervical cancer (HCC)    COPD (chronic obstructive pulmonary disease) (HCC)    emphysema   Coronary artery disease    Family history of adverse reaction to anesthesia    paternal grandfather died during surgery-pt unaware what happened   GERD (gastroesophageal reflux disease)    Hyperlipidemia    Hypertension    Hyponatremia 02/27/2019   Hypothyroidism    Infected cat bite 1980s   was hospitalized   Irregular heartbeat    Medical history non-contributory    Peripheral vascular disease (HCC)    Renal insufficiency    Tobacco use    Past Surgical History: Past Surgical History:  Procedure Laterality Date   CAROTID ENDARTERECTOMY Right Oct. 2015   Dr. Wyn Quaker   CAROTID STENOSIS Left April 2016   carotid stenosis surgery   COLONOSCOPY WITH PROPOFOL N/A 11/08/2016   Procedure: COLONOSCOPY WITH PROPOFOL;  Surgeon: Wyline Mood, MD;  Location: [facility] ENDOSCOPY;  Service: Gastroenterology;  Laterality: N/A;   ESOPHAGOGASTRODUODENOSCOPY (EGD) WITH PROPOFOL N/A 11/08/2016   Procedure: ESOPHAGOGASTRODUODENOSCOPY (EGD) WITH PROPOFOL;  Surgeon: Wyline Mood, MD;   Location[facility] ENDOSCOPY;  Service: Gastroenterology;  Laterality: N/A;   FLEXIBLE BRONCHOSCOPY N/A 12/17/2014   Procedure: FLEXIBLE BRONCHOSCOPY;  Surgeon: Merwyn Katos, MD;  Location: [facility] ORS;  Service: Pulmonary;  Laterality: N/A;   INCISION AND DRAINAGE / EXCISION THYROGLOSSAL CYST  April 2011   PORTA CATH INSERTION N/A 03/03/2021   Procedure: PORTA CATH INSERTION;  Surgeon: Annice Needy, MD;  Location: [facility] INVASIVE CV LAB;  Service: Cardiovascular;  Laterality: N/A;   VIDEO BRONCHOSCOPY WITH ENDOBRONCHIAL ULTRASOUND N/A 01/12/2021   Procedure: VIDEO BRONCHOSCOPY WITH ENDOBRONCHIAL ULTRASOUND;  Surgeon: Salena Saner, MD;  Location: [facility] ORS;  Service: Cardiopulmonary;  Laterality: N/A;   OB/GYN History:  She underwent menopause many years ago, no HRT.  Had 5 children. Not sexually active, husband had prostate cancer.    Family History: Family History  Problem Relation Age of Onset   Congestive Heart Failure Mother    Heart disease Mother    Hypertension Mother    Hypertension Sister    Diabetes Sister    Heart disease Sister    Heart disease Maternal Uncle    Heart disease Maternal Grandmother    Stroke Maternal Grandfather    Cancer Brother        liver, lung   Heart disease Brother    Hypertension Brother    Heart attack Brother    COPD Neg Hx    Breast cancer Neg Hx     Social History: Social History   Socioeconomic History   Marital status: Married    Spouse name: Rayna Sexton   Number of children: Not on file   Years of education: Not on file   Highest education level: 9th grade  Occupational History  Occupation: retired   Tobacco Use   Smoking status: Every Day    Current packs/day: 0.50    Average packs/day: 0.5 packs/day for 50.0 years (25.0 ttl pk-yrs)    Types: Cigarettes   Smokeless tobacco: Never   Tobacco comments:    0.5 PPD 02/10/2021  Vaping Use   Vaping status: Former  Substance and Sexual Activity   Alcohol use: No    Alcohol/week: 0.0  standard drinks of alcohol   Drug use: No   Sexual activity: Yes  Other Topics Concern   Not on file  Social History Narrative   Lives at home with husband   Social Determinants of Health   Financial Resource Strain: Low Risk  (03/15/2021)   Overall Financial Resource Strain (CARDIA)    Difficulty of Paying Living Expenses: Not hard at all  Food Insecurity: No Food Insecurity (03/15/2021)   Hunger Vital Sign    Worried About Running Out of Food in the Last Year: Never true    Ran Out of Food in the Last Year: Never true  Transportation Needs: No Transportation Needs (03/15/2021)   PRAPARE - Administrator, Civil Service (Medical): No    Lack of Transportation (Non-Medical): No  Physical Activity: Inactive (03/15/2021)   Exercise Vital Sign    Days of Exercise per Week: 0 days    Minutes of Exercise per Session: 0 min  Stress: No Stress Concern Present (03/15/2021)   [employer] of Occupational Health - Occupational Stress Questionnaire    Feeling of Stress : Not at all  Social Connections: Moderately Isolated (03/15/2021)   Social Connection and Isolation Panel [NHANES]    Frequency of Communication with Friends and Family: More than three times a week    Frequency of Social Gatherings with Friends and Family: More than three times a week    Attends Religious Services: Never    Database administrator or Organizations: No    Attends Banker Meetings: Never    Marital Status: Married  Catering manager Violence: Not At Risk (03/15/2021)   Humiliation, Afraid, Rape, and Kick questionnaire    Fear of Current or Ex-Partner: No    Emotionally Abused: No    Physically Abused: No    Sexually Abused: No   Allergies: Allergies  Allergen Reactions   Atorvastatin Other (See Comments)    dizziness   Review of Systems General:  no complaints Skin: no complaints Eyes: no complaints HEENT: no complaints Breasts: no complaints Pulmonary: no  complaints Cardiac: no complaints Gastrointestinal: no complaints Genitourinary/Sexual: no complaints Ob/Gyn: no complaints Musculoskeletal: no complaints Hematology: no complaints Neurologic/Psych: no complaints   Objective:  Physical Examination:  Vitals:   12/20/22 1411  BP: 137/71  Pulse: 88  Resp: 20  Temp: 97.6 F (36.4 C)  SpO2: 100%    ECOG Performance Status: 1 - Symptomatic but completely ambulatory   GENERAL: Patient is a frail appearing female in no acute distress ABDOMEN:  Soft, nontender.  Positive, normoactive bowel sounds.  EXTREMITIES:  No peripheral edema.   NEURO:  Nonfocal. Well oriented.  Appropriate affect.  Pelvic: Exam chaperoned by nursing.  EGBUS: no lesions Cervix: Unable to see due to vaginal agglutination.  On exam small and nontender to palpation Vagina: Atrophic; no lesions, no discharge or bleeding; Agglutinated at the left apex Uterus: normal size, nontender, mobile Adnexa: no palpable masses, nontender  Lab Review n/a  Assessment:  Mary Hoover is a 77 y.o. female diagnosed with  probably stage IIIc (T1b N1 Mx) squamous cell cancer of the cervix 12/24. On PET/CT large primary cervical cancer and small bilateral pelvic sidewall lymph nodes show discernible FDG accumulation.  Treated with external radiation and carboplatin at [facility] and brachytherapy at [facility] and completed 03/2019. NED on exam today.  PAP 12/14/2021 NILM/HRHPV negative.   CT scan for abdominal pain 1/24 just showed constipation.   Lung cancer: s/p Chemotherapy and radiation-immunotherapy durvalumab maintenance.   Medical co-morbidities complicating care: heavy smoker/emphysema, vascular disease s/p carotid endarterectomy.  Plan:   Problem List Items Addressed This Visit       Genitourinary   Cervical cancer (HCC) (Chronic)   Other Visit Diagnoses     Encounter for follow-up surveillance of cervical cancer    -  Primary       She has previously advised to  use vaginal dilator daily, but is not sexually active and does not want to.  Will continue to recommend that use, however, she does not feel comfortable using.   RTC 8 months.  If she has any new symptoms we can see her sooner.    Dr Rushie Chestnut following for lung cancer surveillance.  Also will continue with Dr Cathie Hoops for maintenance Durvulumab treatment of lung cancer.  Alinda Dooms, NP  I personally interviewed and examined the patient. Agreed with the above/below plan of care. I have directly contributed to assessment and plan of care of this patient and educated and discussed with patient and family.  Leida Lauth, MD

## 2022-12-20 NOTE — Progress Notes (Signed)
Radiation Oncology Follow up Note  Name: Mary Hoover   Date:   12/20/2022 MRN:  [id number] DOB: 1945/05/15    This 77 y.o. female presents to the clinic today for 23-month follow-up status post concurrent chemoradiation therapy for stage III adenocarcinoma the right lung and patient previously treated for cervical cancer and had SBRT to her left lung.Marland Kitchen  REFERRING PROVIDER: Dorcas Carrow, DO  HPI: The patient, with a history of FIGO stage three C cervical carcinoma and stage three adenocarcinoma of the lung, has been treated with concurrent chemo and radiation maintenance durvalumab. She is now over a year and a half out from her concurrent chemo radiation for lung cancer. A recent CT scan showed radiation changes in the right hemithorax and mild underlying nodularity measuring up to eleven millimeters, which has remained unchanged over time. There is also a nine millimeter short axis subcarinal node, which has increased from six millimeters. This will be tracked with follow-up CT scans.  The patient reports difficulty breathing in the mornings, which is managed with an inhaler. She also coughs up phlegm but denies any hemoptysis. Bowel movements have improved since starting a probiotic. However, she reports occasional urinary incontinence..  COMPLICATIONS OF TREATMENT: none  FOLLOW UP COMPLIANCE: keeps appointments   PHYSICAL EXAM:  BP 138/71   Pulse 88   Temp 97.6 F (36.4 C) (Tympanic)   Wt 132 lb (59.9 kg)   SpO2 100%   BMI 20.07 kg/m  Frail-appearing female in NAD well-developed well-nourished patient in NAD. HEENT reveals PERLA, EOMI, discs not visualized.  Oral cavity is clear. No oral mucosal lesions are identified. Neck is clear without evidence of cervical or supraclavicular adenopathy. Lungs are clear to A&P. Cardiac examination is essentially unremarkable with regular rate and rhythm without murmur rub or thrill. Abdomen is benign with no organomegaly or masses noted.  Motor sensory and DTR levels are equal and symmetric in the upper and lower extremities. Cranial nerves II through XII are grossly intact. Proprioception is intact. No peripheral adenopathy or edema is identified. No motor or sensory levels are noted. Crude visual fields are within normal range.  RADIOLOGY RESULTS: CT scans reviewed compatible with above-stated findings RADIOLOGY Chest CT: Radiation changes in the right hemithorax, mild underlying nodularity measuring up to 11 mm, 9 mm short axis subcarinal node (previously 6 mm) (10/2022)  PLAN: Stage III Cervical Carcinoma Stable with no new symptoms reported.  -Continue current treatment plan.  Stage III Adenocarcinoma of the Lung Stable with radiation changes in the right hemithorax and mild underlying nodularity. Increase in size of subcarinal node from 6mm to 9mm. -Track changes with follow-up CT scans.  Morning Respiratory Difficulty Difficulty breathing upon waking, improved with use of inhaler. -Continue use of inhaler as needed.  Urinary Incontinence Reports occasional leakage before reaching the bathroom. -No intervention discussed.  Bowel Regularity Improvement with probiotic use. -Continue probiotic.  Follow-up in 6 months.    Carmina Miller, MD

## 2022-12-21 ENCOUNTER — Other Ambulatory Visit: Payer: Self-pay

## 2022-12-26 DIAGNOSIS — D485 Neoplasm of uncertain behavior of skin: Secondary | ICD-10-CM | POA: Diagnosis not present

## 2022-12-26 DIAGNOSIS — C44329 Squamous cell carcinoma of skin of other parts of face: Secondary | ICD-10-CM | POA: Diagnosis not present

## 2022-12-27 ENCOUNTER — Inpatient Hospital Stay: Payer: Medicare PPO

## 2022-12-27 DIAGNOSIS — Z95828 Presence of other vascular implants and grafts: Secondary | ICD-10-CM

## 2022-12-27 DIAGNOSIS — C3491 Malignant neoplasm of unspecified part of right bronchus or lung: Secondary | ICD-10-CM | POA: Diagnosis not present

## 2022-12-27 DIAGNOSIS — Z923 Personal history of irradiation: Secondary | ICD-10-CM | POA: Diagnosis not present

## 2022-12-27 DIAGNOSIS — Z9221 Personal history of antineoplastic chemotherapy: Secondary | ICD-10-CM | POA: Diagnosis not present

## 2022-12-27 DIAGNOSIS — Z8541 Personal history of malignant neoplasm of cervix uteri: Secondary | ICD-10-CM | POA: Diagnosis not present

## 2022-12-27 MED ORDER — SODIUM CHLORIDE 0.9% FLUSH
10.0000 mL | Freq: Once | INTRAVENOUS | Status: AC
  Administered 2022-12-27: 10 mL via INTRAVENOUS
  Filled 2022-12-27: qty 10

## 2022-12-27 MED ORDER — HEPARIN SOD (PORK) LOCK FLUSH 100 UNIT/ML IV SOLN
500.0000 [IU] | Freq: Once | INTRAVENOUS | Status: AC
  Administered 2022-12-27: 500 [IU] via INTRAVENOUS
  Filled 2022-12-27: qty 5

## 2023-01-12 DIAGNOSIS — C44329 Squamous cell carcinoma of skin of other parts of face: Secondary | ICD-10-CM | POA: Diagnosis not present

## 2023-01-24 ENCOUNTER — Other Ambulatory Visit: Payer: Self-pay | Admitting: Family Medicine

## 2023-01-25 NOTE — Telephone Encounter (Signed)
Requested Prescriptions  Pending Prescriptions Disp Refills   levothyroxine (SYNTHROID) 88 MCG tablet [Pharmacy Med Name: Levothyroxine Sodium Oral Tablet 88 MCG] 90 tablet 1    Sig: TAKE 1 TABLET EVERY DAY     Endocrinology:  Hypothyroid Agents Passed - 01/25/2023  3:12 PM      Passed - TSH in normal range and within 360 days    TSH  Date Value Ref Range Status  09/25/2022 0.761 0.450 - 4.500 uIU/mL Final         Passed - Valid encounter within last 12 months    Recent Outpatient Visits           4 months ago Anemia in chronic kidney disease (CODE)   [place] [facility] [place], Megan P, DO   9 months ago Right foot pain   [place] [facility] [place], Megan P, DO   9 months ago Encounter for [employer] annual wellness exam   [place] [facility] [place], [place], DO   11 months ago Essential hypertension   [place] [facility] [address] [place], Megan P, DO   1 year ago Cutaneous abscess of neck   [place] [facility] [place], Oswaldo Conroy, PA-C       Future Appointments             In 2 months Laural Benes, Oralia Rud, DO  [facility]

## 2023-01-26 ENCOUNTER — Telehealth: Payer: Self-pay | Admitting: Family Medicine

## 2023-01-26 NOTE — Telephone Encounter (Signed)
Medication Refill -  Most Recent Primary Care Visit:  Provider: Dorcas Carrow  Department: [facility]  Visit Type: OFFICE VISIT  Date: 09/25/2022  Medication: alendronate (FOSAMAX) 70 MG tablet   Has the patient contacted their pharmacy? no (Agent: If no, request that the patient contact the pharmacy for the refill. If patient does not wish to contact the pharmacy document the reason why and proceed with request.) (Agent: If yes, when and what did the pharmacy advise?)  Is this the correct pharmacy for this prescription? Yes If no, delete pharmacy and type the correct one.  This is the patient's preferred pharmacy:  [facility] - [place], [place] - [address] [place] [address] Phone: [phone] Fax: [phone]   Has the prescription been filled recently? Yes  Is the patient out of the medication? Yes  Has the patient been seen for an appointment in the last year OR does the patient have an upcoming appointment? Yes  Can we respond through MyChart? No  Agent: Please be advised that Rx refills may take up to 3 business days. We ask that you follow-up with your pharmacy.

## 2023-01-30 ENCOUNTER — Ambulatory Visit: Payer: Self-pay

## 2023-01-30 NOTE — Telephone Encounter (Signed)
  Chief Complaint: Dizziness, SOB - sweating Symptoms: Above Frequency: 1-2 weeks increasing in duration and intensity Pertinent Negatives: Patient denies  Disposition: [x] ED /[] Urgent Care (no appt availability in office) / [] Appointment(In office/virtual)/ []  [facility] [facility]/ [] Home Care/ [] Refused Recommended Disposition /[] [facility]/ []  Follow-up with PCP Additional Notes: PT reports that for the past 1-2 weeks she has had episodes of dizziness and SOB, with sweating. These episodes are increasing in duration and intensity. Last night this continued for 1 hour.  Pt cannot walk and needs to lay down and wait for to to pass. BP and HR were checked after pt started to feel better last night. Measurements are unknown but were thought to be normal. Following episode pt was cold to the touch, and was sweaty.  Pt states that it seems to start when she bends over. Advised ED. Pt will go tomorrow morning, or when she has another episode.  Please advise.    Summary: dizziness   Pt called in says has had dizziness going for about 3 weeks and breaks out in a sweat     Reason for Disposition  SEVERE dizziness (e.g., unable to stand, requires support to walk, feels like passing out now)  Answer Assessment - Initial Assessment Questions 1. DESCRIPTION: Describe your dizziness.     faint 2. LIGHTHEADED: Do you feel lightheaded? (e.g., somewhat faint, woozy, weak upon standing)     lightheaded 3. VERTIGO: Do you feel like either you or the room is spinning or tilting? (i.e. vertigo)     no 4. SEVERITY: How bad is it?  Do you feel like you are going to faint? Can you stand and walk?   - MILD: Feels slightly dizzy, but walking normally.   - MODERATE: Feels unsteady when walking, but not falling; interferes with normal activities (e.g., school, work).   - SEVERE: Unable to walk without falling, or requires assistance to walk without falling; feels like passing out now.       severe 5. ONSET:  When did the dizziness begin?     3 weeks 6. AGGRAVATING FACTORS: Does anything make it worse? (e.g., standing, change in head position)     Cold rag helps. 7. HEART RATE: Can you tell me your heart rate? How many beats in 15 seconds?  (Note: not all patients can do this)       Ok per husband 8. CAUSE: What do you think is causing the dizziness?     unsure 9. RECURRENT SYMPTOM: Have you had dizziness before? If Yes, ask: When was the last time? What happened that time?     Yes - over the past  10. OTHER SYMPTOMS: Do you have any other symptoms? (e.g., fever, chest pain, vomiting, diarrhea, bleeding)       SOB - sweating  Protocols used: Dizziness - Lightheadedness-A-AH

## 2023-01-30 NOTE — Telephone Encounter (Signed)
Please get scheduled for Thursday

## 2023-01-30 NOTE — Telephone Encounter (Signed)
Pt was scheduled for 02/01/2023 @ 3:40 pm.

## 2023-02-01 ENCOUNTER — Ambulatory Visit
Admission: RE | Admit: 2023-02-01 | Discharge: 2023-02-01 | Disposition: A | Discharge status: Home / Self Care | Payer: Medicare PPO | Attending: Family Medicine | Admitting: Family Medicine

## 2023-02-01 ENCOUNTER — Encounter: Payer: Self-pay | Admitting: Oncology

## 2023-02-01 ENCOUNTER — Ambulatory Visit (INDEPENDENT_AMBULATORY_CARE_PROVIDER_SITE_OTHER): Payer: Medicare HMO | Admitting: Family Medicine

## 2023-02-01 ENCOUNTER — Ambulatory Visit
Admission: RE | Admit: 2023-02-01 | Discharge: 2023-02-01 | Disposition: A | Discharge status: Home / Self Care | Payer: Medicare PPO | Source: Ambulatory Visit | Attending: Family Medicine | Admitting: Family Medicine

## 2023-02-01 ENCOUNTER — Encounter: Payer: Self-pay | Admitting: Family Medicine

## 2023-02-01 VITALS — BP 153/80 | HR 87 | Wt 138.2 lb

## 2023-02-01 DIAGNOSIS — C3491 Malignant neoplasm of unspecified part of right bronchus or lung: Secondary | ICD-10-CM | POA: Diagnosis not present

## 2023-02-01 DIAGNOSIS — D631 Anemia in chronic kidney disease: Secondary | ICD-10-CM | POA: Diagnosis not present

## 2023-02-01 DIAGNOSIS — R0602 Shortness of breath: Secondary | ICD-10-CM

## 2023-02-01 DIAGNOSIS — J439 Emphysema, unspecified: Secondary | ICD-10-CM | POA: Diagnosis not present

## 2023-02-01 DIAGNOSIS — R0902 Hypoxemia: Secondary | ICD-10-CM

## 2023-02-01 DIAGNOSIS — R42 Dizziness and giddiness: Secondary | ICD-10-CM | POA: Diagnosis not present

## 2023-02-01 DIAGNOSIS — E034 Atrophy of thyroid (acquired): Secondary | ICD-10-CM | POA: Diagnosis not present

## 2023-02-01 NOTE — Assessment & Plan Note (Signed)
Will recheck labs today. Await results. Treat as needed.

## 2023-02-01 NOTE — Progress Notes (Signed)
 BP (!) 153/80   Pulse 87   Wt 138 lb 3.2 oz (62.7 kg)   SpO2 100% Comment: on 2L oxygen  BMI 21.01 kg/m    Subjective:    Patient ID: Mary Hoover, female    DOB: October 24, 1945, 78 y.o.   MRN: [id number]  HPI: Mary Hoover is a 78 y.o. female  Chief Complaint  Patient presents with   Dizziness    Patient says she tends to notice it more when she bends over and stands back up straight. Patient says she when she was laying down last night, when she turned over on her side, she says it felt as if the room was spinning and says she had to lay flat it kind of went away. Patient says when she went to the bathroom, she noticed she was dizziness again. Patient says the episodes come and go and she hasn't had one since last night. Patient says they tend to last about 30 to 40 minutes.    DIZZINESS Duration: 3 weeks Description of symptoms: room spinning Duration of episode: 30-40 minutes Dizziness frequency: recurrent Triggered by rolling over in bed: yes Triggered by bending over: yes Aggravated by head movement: yes Aggravated by exertion, coughing, loud noises: no Recent head injury: no Recent or current viral symptoms: no History of vasovagal episodes: no Nausea: yes Vomiting: yes x1 Tinnitus: yes L ear Hearing loss: yes Aural fullness: no Headache: no Photophobia/phonophobia: no Unsteady gait: yes Postural instability: no Diplopia, dysarthria, dysphagia or weakness: no Related to exertion: no Pallor: yes Diaphoresis: yes Dyspnea: yes Chest pain: yes  Relevant past medical, surgical, family and social history reviewed and updated as indicated. Interim medical history since our last visit reviewed. Allergies and medications reviewed and updated.  Review of Systems  Constitutional:  Positive for fatigue. Negative for activity change, appetite change, chills, diaphoresis, fever and unexpected weight change.  HENT: Negative.    Respiratory:  Positive for shortness  of breath. Negative for apnea, cough, choking, chest tightness, wheezing and stridor.   Cardiovascular:  Positive for chest pain. Negative for palpitations and leg swelling.  Gastrointestinal: Negative.   Neurological:  Positive for dizziness and light-headedness. Negative for tremors, seizures, syncope, facial asymmetry, speech difficulty, weakness, numbness and headaches.  Psychiatric/Behavioral: Negative.      Per HPI unless specifically indicated above     Objective:    BP (!) 153/80   Pulse 87   Wt 138 lb 3.2 oz (62.7 kg)   SpO2 100% Comment: on 2L oxygen  BMI 21.01 kg/m   Wt Readings from Last 3 Encounters:  02/01/23 138 lb 3.2 oz (62.7 kg)  12/20/22 132 lb (59.9 kg)  12/20/22 132 lb 0.9 oz (59.9 kg)    Physical Exam Vitals and nursing note reviewed.  Constitutional:      General: She is not in acute distress.    Appearance: Normal appearance. She is ill-appearing. She is not toxic-appearing or diaphoretic.  HENT:     Head: Normocephalic and atraumatic.     Right Ear: External ear normal.     Left Ear: External ear normal.     Nose: Nose normal.     Mouth/Throat:     Mouth: Mucous membranes are moist.     Pharynx: Oropharynx is clear.  Eyes:     General: No scleral icterus.       Right eye: No discharge.        Left eye: No discharge.  Extraocular Movements: Extraocular movements intact.     Conjunctiva/sclera: Conjunctivae normal.     Pupils: Pupils are equal, round, and reactive to light.  Cardiovascular:     Rate and Rhythm: Normal rate and regular rhythm.     Pulses: Normal pulses.     Heart sounds: Normal heart sounds. No murmur heard.    No friction rub. No gallop.  Pulmonary:     Effort: Pulmonary effort is normal. No respiratory distress.     Breath sounds: Normal breath sounds. No stridor. No wheezing, rhonchi or rales.  Chest:     Chest wall: No tenderness.  Musculoskeletal:        General: Normal range of motion.     Cervical back: Normal  range of motion and neck supple.  Skin:    General: Skin is warm and dry.     Capillary Refill: Capillary refill takes less than 2 seconds.     Coloration: Skin is not jaundiced or pale.     Findings: No bruising, erythema, lesion or rash.  Neurological:     General: No focal deficit present.     Mental Status: She is alert and oriented to person, place, and time. Mental status is at baseline.  Psychiatric:        Mood and Affect: Mood normal.        Behavior: Behavior normal.        Thought Content: Thought content normal.        Judgment: Judgment normal.     Results for orders placed or performed in visit on 11/16/22  CMP ([facility] only)   Collection Time: 11/16/22 10:53 AM  Result Value Ref Range   Sodium 134 (L) 135 - 145 mmol/L   Potassium 3.7 3.5 - 5.1 mmol/L   Chloride 107 98 - 111 mmol/L   CO2 22 22 - 32 mmol/L   Glucose, Bld 121 (H) 70 - 99 mg/dL   BUN 8 8 - 23 mg/dL   Creatinine 9.21 9.55 - 1.00 mg/dL   Calcium  8.6 (L) 8.9 - 10.3 mg/dL   Total Protein 7.0 6.5 - 8.1 g/dL   Albumin 3.6 3.5 - 5.0 g/dL   AST 22 15 - 41 U/L   ALT 18 0 - 44 U/L   Alkaline Phosphatase 92 38 - 126 U/L   Total Bilirubin 0.4 0.3 - 1.2 mg/dL   GFR, Estimated >39 >39 mL/min   Anion gap 5 5 - 15  CBC with Differential ([facility] Only)   Collection Time: 11/16/22 10:53 AM  Result Value Ref Range   WBC Count 5.7 4.0 - 10.5 K/uL   RBC 3.30 (L) 3.87 - 5.11 MIL/uL   Hemoglobin 10.7 (L) 12.0 - 15.0 g/dL   HCT 68.2 (L) 63.9 - 53.9 %   MCV 96.1 80.0 - 100.0 fL   MCH 32.4 26.0 - 34.0 pg   MCHC 33.8 30.0 - 36.0 g/dL   RDW 87.1 88.4 - 84.4 %   Platelet Count 257 150 - 400 K/uL   nRBC 0.0 0.0 - 0.2 %   Neutrophils Relative % 79 %   Neutro Abs 4.4 1.7 - 7.7 K/uL   Lymphocytes Relative 10 %   Lymphs Abs 0.6 (L) 0.7 - 4.0 K/uL   Monocytes Relative 8 %   Monocytes Absolute 0.5 0.1 - 1.0 K/uL   Eosinophils Relative 3 %   Eosinophils Absolute 0.2 0.0 - 0.5 K/uL   Basophils Relative 0 %    Basophils Absolute  0.0 0.0 - 0.1 K/uL   Immature Granulocytes 0 %   Abs Immature Granulocytes 0.02 0.00 - 0.07 K/uL      Assessment & Plan:   Problem List Items Addressed This Visit       Respiratory   Primary lung adenocarcinoma (HCC) (Chronic)   Hypoxic today at 84%. Scheduled for CT 1/17. Will check CXR today. Await results. Needs home oxygen.         Endocrine   Hypothyroidism   Will recheck labs today. Await results. Treat as needed.         Genitourinary   Anemia in chronic kidney disease (CODE) (Chronic)   Checking labs. Await results. Treat as needed.       Relevant Orders   TSH   Other Visit Diagnoses       Hypoxia    -  Primary   Hypoxic today at 84%. Improved on 2L oxygen- will get her home oxygen. Check stat CXR. Checking labs. Await results.     SOB (shortness of breath)       Hypoxic today at 84%. Improved on 2L oxygen- will get her home oxygen. Check stat CXR. Checking labs. Await results.   Relevant Orders   Comprehensive metabolic panel   A87   EKG 12-Lead   DG Chest 2 View   CBC     Dizziness       No significant nystagmus. Hypoxic. Concern for that being primary cause of dizziness. Call with any concerns.        Follow up plan: Return Monday.

## 2023-02-01 NOTE — Assessment & Plan Note (Signed)
Checking labs. Await results. Treat as needed.

## 2023-02-01 NOTE — Assessment & Plan Note (Signed)
 Hypoxic today at 84%. Scheduled for CT 1/17. Will check CXR today. Await results. Needs home oxygen.

## 2023-02-02 ENCOUNTER — Other Ambulatory Visit: Payer: Self-pay | Admitting: Family Medicine

## 2023-02-02 ENCOUNTER — Encounter: Payer: Self-pay | Admitting: Oncology

## 2023-02-02 LAB — CBC
Hematocrit: 38.7 % (ref 34.0–46.6)
Hemoglobin: 14 g/dL (ref 11.1–15.9)
MCH: 33.8 pg — ABNORMAL HIGH (ref 26.6–33.0)
MCHC: 36.2 g/dL — ABNORMAL HIGH (ref 31.5–35.7)
MCV: 94 fL (ref 79–97)
Platelets: 294 10*3/uL (ref 150–450)
RBC: 4.14 x10E6/uL (ref 3.77–5.28)
RDW: 13.3 % (ref 11.7–15.4)
WBC: 8.1 10*3/uL (ref 3.4–10.8)

## 2023-02-02 MED ORDER — LEVOTHYROXINE SODIUM 100 MCG PO TABS: 100.0000 ug | ORAL_TABLET | Freq: Every day | ORAL | 1 refills | Status: DC

## 2023-02-03 ENCOUNTER — Encounter: Payer: Self-pay | Admitting: Oncology

## 2023-02-05 ENCOUNTER — Ambulatory Visit (INDEPENDENT_AMBULATORY_CARE_PROVIDER_SITE_OTHER): Payer: Medicare HMO | Admitting: Family Medicine

## 2023-02-05 ENCOUNTER — Encounter: Payer: Self-pay | Admitting: Family Medicine

## 2023-02-05 ENCOUNTER — Encounter: Payer: Self-pay | Admitting: Oncology

## 2023-02-05 VITALS — BP 143/73 | HR 89 | Wt 138.2 lb

## 2023-02-05 DIAGNOSIS — J449 Chronic obstructive pulmonary disease, unspecified: Secondary | ICD-10-CM

## 2023-02-05 LAB — COMPREHENSIVE METABOLIC PANEL
ALT: 18 [IU]/L (ref 0–32)
AST: 24 [IU]/L (ref 0–40)
Albumin: 4.7 g/dL (ref 3.8–4.8)
Alkaline Phosphatase: 116 [IU]/L (ref 44–121)
BUN/Creatinine Ratio: 9 — ABNORMAL LOW (ref 12–28)
BUN: 8 mg/dL (ref 8–27)
Bilirubin Total: <0.2 mg/dL (ref 0.0–1.2)
CO2: 16 mmol/L — ABNORMAL LOW (ref 20–29)
Calcium: 10 mg/dL (ref 8.7–10.3)
Chloride: 95 mmol/L — ABNORMAL LOW (ref 96–106)
Creatinine, Ser: 0.92 mg/dL (ref 0.57–1.00)
Globulin, Total: 3.1 g/dL (ref 1.5–4.5)
Glucose: 91 mg/dL (ref 70–99)
Potassium: 4.4 mmol/L (ref 3.5–5.2)
Sodium: 134 mmol/L (ref 134–144)
Total Protein: 7.8 g/dL (ref 6.0–8.5)
eGFR: 65 mL/min/{1.73_m2} (ref >59)

## 2023-02-05 LAB — VITAMIN B12

## 2023-02-05 LAB — TSH: TSH: 9.93 u[IU]/mL — ABNORMAL HIGH (ref 0.450–4.500)

## 2023-02-05 NOTE — Progress Notes (Signed)
 BP (!) 143/73   Pulse 89   Wt 138 lb 3.2 oz (62.7 kg)   SpO2 94%   BMI 21.01 kg/m    Subjective:    Patient ID: Mary Hoover, female    DOB: 09-Aug-1945, 78 y.o.   MRN: [id number]  HPI: Mary Hoover is a 78 y.o. female  Chief Complaint  Patient presents with   Dizziness    Patient says she is feeling a little better, as long as she is careful. Patient says when she takes the proper precautions and take her time, she is not as dizzy.    Shortness of Breath    Patient says her breathing has also gotten better since Friday.    Still having trouble if she's moving her head too fast or getting up too fast. She is less dizzy. Still tired, but feeling much better. She notes that she has not been wearing her oxygen, but has been feeling more like herself. No other concerns or complaints at this time.   Relevant past medical, surgical, family and social history reviewed and updated as indicated. Interim medical history since our last visit reviewed. Allergies and medications reviewed and updated.  Review of Systems  Constitutional: Negative.   HENT: Negative.    Respiratory: Negative.    Cardiovascular: Negative.   Musculoskeletal: Negative.   Neurological:  Positive for dizziness. Negative for tremors, seizures, syncope, facial asymmetry, speech difficulty, weakness, light-headedness, numbness and headaches.  Psychiatric/Behavioral: Negative.      Per HPI unless specifically indicated above     Objective:    BP (!) 143/73   Pulse 89   Wt 138 lb 3.2 oz (62.7 kg)   SpO2 94%   BMI 21.01 kg/m   Wt Readings from Last 3 Encounters:  02/05/23 138 lb 3.2 oz (62.7 kg)  02/01/23 138 lb 3.2 oz (62.7 kg)  12/20/22 132 lb (59.9 kg)    Physical Exam Vitals and nursing note reviewed.  Constitutional:      General: She is not in acute distress.    Appearance: Normal appearance. She is well-developed and normal weight. She is not ill-appearing, toxic-appearing or  diaphoretic.  HENT:     Head: Normocephalic and atraumatic.     Right Ear: External ear normal.     Left Ear: External ear normal.     Nose: Nose normal.     Mouth/Throat:     Mouth: Mucous membranes are moist.     Pharynx: Oropharynx is clear.  Eyes:     General: No scleral icterus.       Right eye: No discharge.        Left eye: No discharge.     Extraocular Movements: Extraocular movements intact.     Conjunctiva/sclera: Conjunctivae normal.     Pupils: Pupils are equal, round, and reactive to light.  Cardiovascular:     Rate and Rhythm: Normal rate and regular rhythm.     Pulses: Normal pulses.     Heart sounds: Normal heart sounds. No murmur heard.    No friction rub. No gallop.  Pulmonary:     Effort: Pulmonary effort is normal. No respiratory distress.     Breath sounds: No stridor. Examination of the left-upper field reveals wheezing. Examination of the left-middle field reveals wheezing. Examination of the left-lower field reveals wheezing. Wheezing present. No rhonchi or rales.  Chest:     Chest wall: No tenderness.  Musculoskeletal:        General: Normal  range of motion.     Cervical back: Normal range of motion and neck supple.  Skin:    General: Skin is warm and dry.     Capillary Refill: Capillary refill takes less than 2 seconds.     Coloration: Skin is not jaundiced or pale.     Findings: No bruising, erythema, lesion or rash.  Neurological:     General: No focal deficit present.     Mental Status: She is alert and oriented to person, place, and time. Mental status is at baseline.  Psychiatric:        Mood and Affect: Mood normal.        Behavior: Behavior normal.        Thought Content: Thought content normal.        Judgment: Judgment normal.     Results for orders placed or performed in visit on 02/01/23  Comprehensive metabolic panel   Collection Time: 02/01/23  4:13 PM  Result Value Ref Range   Glucose 91 70 - 99 mg/dL   BUN 8 8 - 27 mg/dL    Creatinine, Ser 9.07 0.57 - 1.00 mg/dL   eGFR 65 >40 fO/fpw/8.26   BUN/Creatinine Ratio 9 (L) 12 - 28   Sodium 134 134 - 144 mmol/L   Potassium 4.4 3.5 - 5.2 mmol/L   Chloride 95 (L) 96 - 106 mmol/L   CO2 16 (L) 20 - 29 mmol/L   Calcium  10.0 8.7 - 10.3 mg/dL   Total Protein 7.8 6.0 - 8.5 g/dL   Albumin 4.7 3.8 - 4.8 g/dL   Globulin, Total 3.1 1.5 - 4.5 g/dL   Bilirubin Total <9.7 0.0 - 1.2 mg/dL   Alkaline Phosphatase 116 44 - 121 IU/L   AST 24 0 - 40 IU/L   ALT 18 0 - 32 IU/L  B12   Collection Time: 02/01/23  4:13 PM  Result Value Ref Range   Vitamin B-12 CANCELED pg/mL  TSH   Collection Time: 02/01/23  4:13 PM  Result Value Ref Range   TSH 9.930 (H) 0.450 - 4.500 uIU/mL  CBC   Collection Time: 02/01/23  4:39 PM  Result Value Ref Range   WBC 8.1 3.4 - 10.8 x10E3/uL   RBC 4.14 3.77 - 5.28 x10E6/uL   Hemoglobin 14.0 11.1 - 15.9 g/dL   Hematocrit 61.2 65.9 - 46.6 %   MCV 94 79 - 97 fL   MCH 33.8 (H) 26.6 - 33.0 pg   MCHC 36.2 (H) 31.5 - 35.7 g/dL   RDW 86.6 88.2 - 84.5 %   Platelets 294 150 - 450 x10E3/uL      Assessment & Plan:   Problem List Items Addressed This Visit       Respiratory   COPD (chronic obstructive pulmonary disease) (HCC) - Primary   Hypoxia resolved. Encouraged her to wear her oxygen at night. X-ray looked good. Will await CT with oncology. Continue to monitor closely.         Follow up plan: Return 02/19/23 please.

## 2023-02-09 ENCOUNTER — Encounter: Payer: Self-pay | Admitting: Oncology

## 2023-02-11 NOTE — Assessment & Plan Note (Signed)
 Hypoxia resolved. Encouraged her to wear her oxygen at night. X-ray looked good. Will await CT with oncology. Continue to monitor closely.

## 2023-02-12 ENCOUNTER — Encounter: Payer: Self-pay | Admitting: Oncology

## 2023-02-16 ENCOUNTER — Inpatient Hospital Stay: Payer: Medicare HMO | Attending: Oncology

## 2023-02-16 ENCOUNTER — Ambulatory Visit
Admission: RE | Admit: 2023-02-16 | Discharge: 2023-02-16 | Disposition: A | Discharge status: Home / Self Care | Payer: Medicare HMO | Source: Ambulatory Visit | Attending: Oncology | Admitting: Oncology

## 2023-02-16 DIAGNOSIS — Z7902 Long term (current) use of antithrombotics/antiplatelets: Secondary | ICD-10-CM | POA: Diagnosis not present

## 2023-02-16 DIAGNOSIS — I200 Unstable angina: Secondary | ICD-10-CM | POA: Diagnosis not present

## 2023-02-16 DIAGNOSIS — D631 Anemia in chronic kidney disease: Secondary | ICD-10-CM | POA: Insufficient documentation

## 2023-02-16 DIAGNOSIS — Z923 Personal history of irradiation: Secondary | ICD-10-CM | POA: Diagnosis not present

## 2023-02-16 DIAGNOSIS — Z716 Tobacco abuse counseling: Secondary | ICD-10-CM | POA: Diagnosis not present

## 2023-02-16 DIAGNOSIS — J439 Emphysema, unspecified: Secondary | ICD-10-CM | POA: Diagnosis present

## 2023-02-16 DIAGNOSIS — N183 Chronic kidney disease, stage 3 unspecified: Secondary | ICD-10-CM | POA: Insufficient documentation

## 2023-02-16 DIAGNOSIS — Z7983 Long term (current) use of bisphosphonates: Secondary | ICD-10-CM | POA: Diagnosis not present

## 2023-02-16 DIAGNOSIS — I739 Peripheral vascular disease, unspecified: Secondary | ICD-10-CM | POA: Diagnosis present

## 2023-02-16 DIAGNOSIS — I25110 Atherosclerotic heart disease of native coronary artery with unstable angina pectoris: Secondary | ICD-10-CM | POA: Diagnosis present

## 2023-02-16 DIAGNOSIS — I129 Hypertensive chronic kidney disease with stage 1 through stage 4 chronic kidney disease, or unspecified chronic kidney disease: Secondary | ICD-10-CM | POA: Diagnosis present

## 2023-02-16 DIAGNOSIS — Z7951 Long term (current) use of inhaled steroids: Secondary | ICD-10-CM | POA: Diagnosis not present

## 2023-02-16 DIAGNOSIS — I2510 Atherosclerotic heart disease of native coronary artery without angina pectoris: Secondary | ICD-10-CM | POA: Diagnosis not present

## 2023-02-16 DIAGNOSIS — Z833 Family history of diabetes mellitus: Secondary | ICD-10-CM | POA: Diagnosis not present

## 2023-02-16 DIAGNOSIS — I2119 ST elevation (STEMI) myocardial infarction involving other coronary artery of inferior wall: Secondary | ICD-10-CM | POA: Diagnosis not present

## 2023-02-16 DIAGNOSIS — I214 Non-ST elevation (NSTEMI) myocardial infarction: Secondary | ICD-10-CM | POA: Diagnosis not present

## 2023-02-16 DIAGNOSIS — I10 Essential (primary) hypertension: Secondary | ICD-10-CM | POA: Diagnosis not present

## 2023-02-16 DIAGNOSIS — C3491 Malignant neoplasm of unspecified part of right bronchus or lung: Secondary | ICD-10-CM

## 2023-02-16 DIAGNOSIS — I700 Atherosclerosis of aorta: Secondary | ICD-10-CM | POA: Diagnosis not present

## 2023-02-16 DIAGNOSIS — K219 Gastro-esophageal reflux disease without esophagitis: Secondary | ICD-10-CM | POA: Diagnosis present

## 2023-02-16 DIAGNOSIS — R911 Solitary pulmonary nodule: Secondary | ICD-10-CM | POA: Diagnosis present

## 2023-02-16 DIAGNOSIS — R0789 Other chest pain: Secondary | ICD-10-CM | POA: Diagnosis not present

## 2023-02-16 DIAGNOSIS — E039 Hypothyroidism, unspecified: Secondary | ICD-10-CM | POA: Diagnosis present

## 2023-02-16 DIAGNOSIS — J449 Chronic obstructive pulmonary disease, unspecified: Secondary | ICD-10-CM | POA: Diagnosis not present

## 2023-02-16 DIAGNOSIS — Z79899 Other long term (current) drug therapy: Secondary | ICD-10-CM | POA: Diagnosis not present

## 2023-02-16 DIAGNOSIS — Z555 Less than a high school diploma: Secondary | ICD-10-CM | POA: Diagnosis not present

## 2023-02-16 DIAGNOSIS — Z85118 Personal history of other malignant neoplasm of bronchus and lung: Secondary | ICD-10-CM | POA: Diagnosis present

## 2023-02-16 DIAGNOSIS — Z8541 Personal history of malignant neoplasm of cervix uteri: Secondary | ICD-10-CM | POA: Insufficient documentation

## 2023-02-16 DIAGNOSIS — E785 Hyperlipidemia, unspecified: Secondary | ICD-10-CM | POA: Diagnosis present

## 2023-02-16 DIAGNOSIS — N182 Chronic kidney disease, stage 2 (mild): Secondary | ICD-10-CM | POA: Diagnosis present

## 2023-02-16 DIAGNOSIS — F17210 Nicotine dependence, cigarettes, uncomplicated: Secondary | ICD-10-CM | POA: Diagnosis present

## 2023-02-16 DIAGNOSIS — Z9221 Personal history of antineoplastic chemotherapy: Secondary | ICD-10-CM | POA: Diagnosis not present

## 2023-02-16 DIAGNOSIS — Z823 Family history of stroke: Secondary | ICD-10-CM | POA: Diagnosis not present

## 2023-02-16 DIAGNOSIS — Z79890 Hormone replacement therapy (postmenopausal): Secondary | ICD-10-CM | POA: Diagnosis not present

## 2023-02-16 DIAGNOSIS — R0602 Shortness of breath: Secondary | ICD-10-CM | POA: Diagnosis not present

## 2023-02-16 DIAGNOSIS — Z8249 Family history of ischemic heart disease and other diseases of the circulatory system: Secondary | ICD-10-CM | POA: Diagnosis not present

## 2023-02-16 LAB — CBC WITH DIFFERENTIAL (CANCER CENTER ONLY)
Abs Immature Granulocytes: 0.06 10*3/uL (ref 0.00–0.07)
Basophils Absolute: 0 10*3/uL (ref 0.0–0.1)
Basophils Relative: 1 %
Eosinophils Absolute: 0.1 10*3/uL (ref 0.0–0.5)
Eosinophils Relative: 2 %
HCT: 32.7 % — ABNORMAL LOW (ref 36.0–46.0)
Hemoglobin: 11.2 g/dL — ABNORMAL LOW (ref 12.0–15.0)
Immature Granulocytes: 1 %
Lymphocytes Relative: 15 %
Lymphs Abs: 0.8 10*3/uL (ref 0.7–4.0)
MCH: 32.8 pg (ref 26.0–34.0)
MCHC: 34.3 g/dL (ref 30.0–36.0)
MCV: 95.9 fL (ref 80.0–100.0)
Monocytes Absolute: 0.5 10*3/uL (ref 0.1–1.0)
Monocytes Relative: 10 %
Neutro Abs: 3.9 10*3/uL (ref 1.7–7.7)
Neutrophils Relative %: 71 %
Platelet Count: 197 10*3/uL (ref 150–400)
RBC: 3.41 MIL/uL — ABNORMAL LOW (ref 3.87–5.11)
RDW: 12.7 % (ref 11.5–15.5)
WBC Count: 5.4 10*3/uL (ref 4.0–10.5)
nRBC: 0 % (ref 0.0–0.2)

## 2023-02-16 LAB — IRON AND TIBC
Iron: 74 ug/dL (ref 28–170)
Saturation Ratios: 24 % (ref 10.4–31.8)
TIBC: 308 ug/dL (ref 250–450)
UIBC: 234 ug/dL

## 2023-02-16 LAB — CMP (CANCER CENTER ONLY)
ALT: 18 U/L (ref 0–44)
AST: 20 U/L (ref 15–41)
Albumin: 3.8 g/dL (ref 3.5–5.0)
Alkaline Phosphatase: 71 U/L (ref 38–126)
Anion gap: 7 (ref 5–15)
BUN: 14 mg/dL (ref 8–23)
CO2: 24 mmol/L (ref 22–32)
Calcium: 8.8 mg/dL — ABNORMAL LOW (ref 8.9–10.3)
Chloride: 102 mmol/L (ref 98–111)
Creatinine: 0.79 mg/dL (ref 0.44–1.00)
GFR, Estimated: >60 mL/min (ref >60)
Glucose, Bld: 100 mg/dL — ABNORMAL HIGH (ref 70–99)
Potassium: 3.9 mmol/L (ref 3.5–5.1)
Sodium: 133 mmol/L — ABNORMAL LOW (ref 135–145)
Total Bilirubin: 0.5 mg/dL (ref 0.0–1.2)
Total Protein: 6.7 g/dL (ref 6.5–8.1)

## 2023-02-16 LAB — RETIC PANEL
Immature Retic Fract: 5.3 % (ref 2.3–15.9)
RBC.: 3.37 MIL/uL — ABNORMAL LOW (ref 3.87–5.11)
Retic Count, Absolute: 58.6 10*3/uL (ref 19.0–186.0)
Retic Ct Pct: 1.7 % (ref 0.4–3.1)
Reticulocyte Hemoglobin: 36 pg (ref >27.9)

## 2023-02-16 LAB — FERRITIN: Ferritin: 107 ng/mL (ref 11–307)

## 2023-02-16 MED ORDER — HEPARIN SOD (PORK) LOCK FLUSH 100 UNIT/ML IV SOLN
500.0000 [IU] | Freq: Once | INTRAVENOUS | Status: AC
Start: 2023-02-16 — End: 2023-02-16
  Administered 2023-02-16: 500 [IU] via INTRAVENOUS
  Filled 2023-02-16: qty 5

## 2023-02-16 MED ORDER — HEPARIN SOD (PORK) LOCK FLUSH 100 UNIT/ML IV SOLN
500.0000 [IU] | Freq: Once | INTRAVENOUS | Status: AC
  Administered 2023-02-16: 500 [IU] via INTRAVENOUS
  Filled 2023-02-16: qty 5

## 2023-02-16 MED ORDER — HEPARIN SOD (PORK) LOCK FLUSH 100 UNIT/ML IV SOLN
INTRAVENOUS | Status: AC
  Filled 2023-02-16: qty 5

## 2023-02-16 MED ORDER — IOHEXOL 300 MG/ML  SOLN
100.0000 mL | Freq: Once | INTRAMUSCULAR | Status: AC | PRN
  Administered 2023-02-16: 75 mL via INTRAVENOUS

## 2023-02-16 MED ORDER — SODIUM CHLORIDE 0.9% FLUSH
10.0000 mL | INTRAVENOUS | Status: DC | PRN
  Administered 2023-02-16: 10 mL via INTRAVENOUS
  Filled 2023-02-16: qty 10

## 2023-02-17 ENCOUNTER — Emergency Department: Payer: Medicare HMO

## 2023-02-17 ENCOUNTER — Other Ambulatory Visit: Payer: Self-pay

## 2023-02-17 DIAGNOSIS — Z716 Tobacco abuse counseling: Secondary | ICD-10-CM

## 2023-02-17 DIAGNOSIS — Z85118 Personal history of other malignant neoplasm of bronchus and lung: Secondary | ICD-10-CM

## 2023-02-17 DIAGNOSIS — I214 Non-ST elevation (NSTEMI) myocardial infarction: Principal | ICD-10-CM | POA: Diagnosis present

## 2023-02-17 DIAGNOSIS — J449 Chronic obstructive pulmonary disease, unspecified: Secondary | ICD-10-CM | POA: Diagnosis not present

## 2023-02-17 DIAGNOSIS — Z7902 Long term (current) use of antithrombotics/antiplatelets: Secondary | ICD-10-CM

## 2023-02-17 DIAGNOSIS — Z555 Less than a high school diploma: Secondary | ICD-10-CM

## 2023-02-17 DIAGNOSIS — Z833 Family history of diabetes mellitus: Secondary | ICD-10-CM

## 2023-02-17 DIAGNOSIS — Z79890 Hormone replacement therapy (postmenopausal): Secondary | ICD-10-CM

## 2023-02-17 DIAGNOSIS — Z8249 Family history of ischemic heart disease and other diseases of the circulatory system: Secondary | ICD-10-CM

## 2023-02-17 DIAGNOSIS — I700 Atherosclerosis of aorta: Secondary | ICD-10-CM | POA: Diagnosis not present

## 2023-02-17 DIAGNOSIS — I129 Hypertensive chronic kidney disease with stage 1 through stage 4 chronic kidney disease, or unspecified chronic kidney disease: Secondary | ICD-10-CM | POA: Diagnosis present

## 2023-02-17 DIAGNOSIS — Z7951 Long term (current) use of inhaled steroids: Secondary | ICD-10-CM

## 2023-02-17 DIAGNOSIS — I739 Peripheral vascular disease, unspecified: Secondary | ICD-10-CM | POA: Diagnosis present

## 2023-02-17 DIAGNOSIS — F17210 Nicotine dependence, cigarettes, uncomplicated: Secondary | ICD-10-CM | POA: Diagnosis present

## 2023-02-17 DIAGNOSIS — R0602 Shortness of breath: Secondary | ICD-10-CM | POA: Diagnosis not present

## 2023-02-17 DIAGNOSIS — Z8679 Personal history of other diseases of the circulatory system: Secondary | ICD-10-CM

## 2023-02-17 DIAGNOSIS — K219 Gastro-esophageal reflux disease without esophagitis: Secondary | ICD-10-CM | POA: Diagnosis present

## 2023-02-17 DIAGNOSIS — E039 Hypothyroidism, unspecified: Secondary | ICD-10-CM | POA: Diagnosis present

## 2023-02-17 DIAGNOSIS — I25110 Atherosclerotic heart disease of native coronary artery with unstable angina pectoris: Secondary | ICD-10-CM | POA: Diagnosis present

## 2023-02-17 DIAGNOSIS — J439 Emphysema, unspecified: Secondary | ICD-10-CM | POA: Diagnosis present

## 2023-02-17 DIAGNOSIS — Z9221 Personal history of antineoplastic chemotherapy: Secondary | ICD-10-CM

## 2023-02-17 DIAGNOSIS — Z79899 Other long term (current) drug therapy: Secondary | ICD-10-CM

## 2023-02-17 DIAGNOSIS — R0789 Other chest pain: Secondary | ICD-10-CM | POA: Diagnosis not present

## 2023-02-17 DIAGNOSIS — N182 Chronic kidney disease, stage 2 (mild): Secondary | ICD-10-CM | POA: Diagnosis present

## 2023-02-17 DIAGNOSIS — Z923 Personal history of irradiation: Secondary | ICD-10-CM

## 2023-02-17 DIAGNOSIS — R911 Solitary pulmonary nodule: Secondary | ICD-10-CM | POA: Diagnosis present

## 2023-02-17 DIAGNOSIS — Z8541 Personal history of malignant neoplasm of cervix uteri: Secondary | ICD-10-CM

## 2023-02-17 DIAGNOSIS — E785 Hyperlipidemia, unspecified: Secondary | ICD-10-CM | POA: Diagnosis present

## 2023-02-17 DIAGNOSIS — Z823 Family history of stroke: Secondary | ICD-10-CM

## 2023-02-17 DIAGNOSIS — Z7983 Long term (current) use of bisphosphonates: Secondary | ICD-10-CM

## 2023-02-17 LAB — BASIC METABOLIC PANEL
Anion gap: 11 (ref 5–15)
BUN: 19 mg/dL (ref 8–23)
CO2: 24 mmol/L (ref 22–32)
Calcium: 9.9 mg/dL (ref 8.9–10.3)
Chloride: 101 mmol/L (ref 98–111)
Creatinine, Ser: 0.96 mg/dL (ref 0.44–1.00)
GFR, Estimated: >60 mL/min (ref >60)
Glucose, Bld: 117 mg/dL — ABNORMAL HIGH (ref 70–99)
Potassium: 4.5 mmol/L (ref 3.5–5.1)
Sodium: 136 mmol/L (ref 135–145)

## 2023-02-17 LAB — CBC
HCT: 35.1 % — ABNORMAL LOW (ref 36.0–46.0)
Hemoglobin: 12.1 g/dL (ref 12.0–15.0)
MCH: 32.8 pg (ref 26.0–34.0)
MCHC: 34.5 g/dL (ref 30.0–36.0)
MCV: 95.1 fL (ref 80.0–100.0)
Platelets: 223 10*3/uL (ref 150–400)
RBC: 3.69 MIL/uL — ABNORMAL LOW (ref 3.87–5.11)
RDW: 12.9 % (ref 11.5–15.5)
WBC: 7.8 10*3/uL (ref 4.0–10.5)
nRBC: 0 % (ref 0.0–0.2)

## 2023-02-17 LAB — TROPONIN I (HIGH SENSITIVITY): Troponin I (High Sensitivity): 93 ng/L — ABNORMAL HIGH (ref <18)

## 2023-02-17 NOTE — ED Triage Notes (Addendum)
Patient C/O SOB and chest pressure that began around 1900. Patient states that her symptoms have resolved for now

## 2023-02-17 NOTE — ED Notes (Signed)
Pt difficult stick.  Lab called for blood draw.

## 2023-02-18 ENCOUNTER — Other Ambulatory Visit: Payer: Self-pay

## 2023-02-18 ENCOUNTER — Other Ambulatory Visit: Payer: Self-pay | Admitting: Family Medicine

## 2023-02-18 ENCOUNTER — Inpatient Hospital Stay
Admission: EM | Admit: 2023-02-18 | Discharge: 2023-02-20 | DRG: 282 | Disposition: A | Discharge status: Other Acute Inpatient Hospital | Payer: Medicare HMO | Attending: Hospitalist | Admitting: Hospitalist

## 2023-02-18 DIAGNOSIS — J449 Chronic obstructive pulmonary disease, unspecified: Secondary | ICD-10-CM | POA: Diagnosis not present

## 2023-02-18 DIAGNOSIS — I214 Non-ST elevation (NSTEMI) myocardial infarction: Principal | ICD-10-CM

## 2023-02-18 DIAGNOSIS — I10 Essential (primary) hypertension: Secondary | ICD-10-CM | POA: Diagnosis not present

## 2023-02-18 DIAGNOSIS — I25110 Atherosclerotic heart disease of native coronary artery with unstable angina pectoris: Secondary | ICD-10-CM | POA: Diagnosis present

## 2023-02-18 DIAGNOSIS — Z85118 Personal history of other malignant neoplasm of bronchus and lung: Secondary | ICD-10-CM | POA: Diagnosis not present

## 2023-02-18 DIAGNOSIS — I2510 Atherosclerotic heart disease of native coronary artery without angina pectoris: Secondary | ICD-10-CM

## 2023-02-18 DIAGNOSIS — Z01818 Encounter for other preprocedural examination: Secondary | ICD-10-CM | POA: Diagnosis not present

## 2023-02-18 DIAGNOSIS — I200 Unstable angina: Secondary | ICD-10-CM | POA: Diagnosis not present

## 2023-02-18 DIAGNOSIS — I25118 Atherosclerotic heart disease of native coronary artery with other forms of angina pectoris: Secondary | ICD-10-CM | POA: Diagnosis not present

## 2023-02-18 DIAGNOSIS — R918 Other nonspecific abnormal finding of lung field: Secondary | ICD-10-CM | POA: Diagnosis not present

## 2023-02-18 DIAGNOSIS — Z7902 Long term (current) use of antithrombotics/antiplatelets: Secondary | ICD-10-CM | POA: Diagnosis not present

## 2023-02-18 DIAGNOSIS — Z9889 Other specified postprocedural states: Secondary | ICD-10-CM

## 2023-02-18 DIAGNOSIS — Z833 Family history of diabetes mellitus: Secondary | ICD-10-CM | POA: Diagnosis not present

## 2023-02-18 DIAGNOSIS — Z720 Tobacco use: Secondary | ICD-10-CM | POA: Diagnosis present

## 2023-02-18 DIAGNOSIS — K219 Gastro-esophageal reflux disease without esophagitis: Secondary | ICD-10-CM | POA: Diagnosis present

## 2023-02-18 DIAGNOSIS — Z7951 Long term (current) use of inhaled steroids: Secondary | ICD-10-CM | POA: Diagnosis not present

## 2023-02-18 DIAGNOSIS — Z555 Less than a high school diploma: Secondary | ICD-10-CM | POA: Diagnosis not present

## 2023-02-18 DIAGNOSIS — J439 Emphysema, unspecified: Secondary | ICD-10-CM | POA: Diagnosis present

## 2023-02-18 DIAGNOSIS — Z79899 Other long term (current) drug therapy: Secondary | ICD-10-CM | POA: Diagnosis not present

## 2023-02-18 DIAGNOSIS — I129 Hypertensive chronic kidney disease with stage 1 through stage 4 chronic kidney disease, or unspecified chronic kidney disease: Secondary | ICD-10-CM | POA: Diagnosis present

## 2023-02-18 DIAGNOSIS — Z8709 Personal history of other diseases of the respiratory system: Secondary | ICD-10-CM | POA: Diagnosis not present

## 2023-02-18 DIAGNOSIS — E039 Hypothyroidism, unspecified: Secondary | ICD-10-CM | POA: Diagnosis present

## 2023-02-18 DIAGNOSIS — F17210 Nicotine dependence, cigarettes, uncomplicated: Secondary | ICD-10-CM | POA: Diagnosis present

## 2023-02-18 DIAGNOSIS — Z823 Family history of stroke: Secondary | ICD-10-CM | POA: Diagnosis not present

## 2023-02-18 DIAGNOSIS — Z8249 Family history of ischemic heart disease and other diseases of the circulatory system: Secondary | ICD-10-CM | POA: Diagnosis not present

## 2023-02-18 DIAGNOSIS — Z716 Tobacco abuse counseling: Secondary | ICD-10-CM | POA: Diagnosis not present

## 2023-02-18 DIAGNOSIS — Z923 Personal history of irradiation: Secondary | ICD-10-CM | POA: Diagnosis not present

## 2023-02-18 DIAGNOSIS — N182 Chronic kidney disease, stage 2 (mild): Secondary | ICD-10-CM | POA: Diagnosis present

## 2023-02-18 DIAGNOSIS — Z9221 Personal history of antineoplastic chemotherapy: Secondary | ICD-10-CM | POA: Diagnosis not present

## 2023-02-18 DIAGNOSIS — I739 Peripheral vascular disease, unspecified: Secondary | ICD-10-CM | POA: Diagnosis present

## 2023-02-18 DIAGNOSIS — I82811 Embolism and thrombosis of superficial veins of right lower extremities: Secondary | ICD-10-CM | POA: Diagnosis not present

## 2023-02-18 DIAGNOSIS — I2119 ST elevation (STEMI) myocardial infarction involving other coronary artery of inferior wall: Secondary | ICD-10-CM | POA: Diagnosis not present

## 2023-02-18 DIAGNOSIS — E785 Hyperlipidemia, unspecified: Secondary | ICD-10-CM | POA: Diagnosis present

## 2023-02-18 DIAGNOSIS — Z8541 Personal history of malignant neoplasm of cervix uteri: Secondary | ICD-10-CM

## 2023-02-18 DIAGNOSIS — R911 Solitary pulmonary nodule: Secondary | ICD-10-CM | POA: Diagnosis present

## 2023-02-18 DIAGNOSIS — Z79890 Hormone replacement therapy (postmenopausal): Secondary | ICD-10-CM | POA: Diagnosis not present

## 2023-02-18 DIAGNOSIS — Z7983 Long term (current) use of bisphosphonates: Secondary | ICD-10-CM | POA: Diagnosis not present

## 2023-02-18 LAB — HEPARIN LEVEL (UNFRACTIONATED)
Heparin Unfractionated: 0.26 [IU]/mL — ABNORMAL LOW (ref 0.30–0.70)
Heparin Unfractionated: 0.25 [IU]/mL — ABNORMAL LOW (ref 0.30–0.70)

## 2023-02-18 LAB — D-DIMER, QUANTITATIVE: D-Dimer, Quant: 0.89 ug{FEU}/mL — ABNORMAL HIGH (ref 0.00–0.50)

## 2023-02-18 LAB — TROPONIN I (HIGH SENSITIVITY)
Troponin I (High Sensitivity): 163 ng/L (ref <18)
Troponin I (High Sensitivity): 465 ng/L (ref <18)
Troponin I (High Sensitivity): 611 ng/L (ref <18)

## 2023-02-18 MED ORDER — METOPROLOL TARTRATE 25 MG PO TABS
12.5000 mg | ORAL_TABLET | Freq: Two times a day (BID) | ORAL | Status: DC
  Administered 2023-02-18 – 2023-02-19 (×4): 12.5 mg via ORAL
  Filled 2023-02-18 (×4): qty 1

## 2023-02-18 MED ORDER — CLOPIDOGREL BISULFATE 75 MG PO TABS
75.0000 mg | ORAL_TABLET | Freq: Every day | ORAL | Status: DC
  Administered 2023-02-18: 75 mg via ORAL
  Filled 2023-02-18: qty 1

## 2023-02-18 MED ORDER — HEPARIN BOLUS VIA INFUSION
900.0000 [IU] | Freq: Once | INTRAVENOUS | Status: AC
  Administered 2023-02-18: 900 [IU] via INTRAVENOUS
  Filled 2023-02-18: qty 900

## 2023-02-18 MED ORDER — ACETAMINOPHEN 325 MG PO TABS: 650.0000 mg | ORAL_TABLET | ORAL | Status: DC | PRN

## 2023-02-18 MED ORDER — NICOTINE 14 MG/24HR TD PT24
14.0000 mg | MEDICATED_PATCH | Freq: Every day | TRANSDERMAL | Status: DC
  Administered 2023-02-18: 14 mg via TRANSDERMAL
  Filled 2023-02-18: qty 1

## 2023-02-18 MED ORDER — FLUTICASONE FUROATE-VILANTEROL 100-25 MCG/ACT IN AEPB
1.0000 | INHALATION_SPRAY | Freq: Every day | RESPIRATORY_TRACT | Status: DC
  Administered 2023-02-18: 1 via RESPIRATORY_TRACT
  Filled 2023-02-18: qty 28

## 2023-02-18 MED ORDER — HEPARIN BOLUS VIA INFUSION
3650.0000 [IU] | Freq: Once | INTRAVENOUS | Status: AC
  Administered 2023-02-18: 3650 [IU] via INTRAVENOUS
  Filled 2023-02-18: qty 3650

## 2023-02-18 MED ORDER — ASPIRIN 81 MG PO CHEW
81.0000 mg | CHEWABLE_TABLET | Freq: Every day | ORAL | Status: DC
  Filled 2023-02-18: qty 1

## 2023-02-18 MED ORDER — UMECLIDINIUM BROMIDE 62.5 MCG/ACT IN AEPB
1.0000 | INHALATION_SPRAY | Freq: Every day | RESPIRATORY_TRACT | Status: DC
  Administered 2023-02-18: 1 via RESPIRATORY_TRACT
  Filled 2023-02-18: qty 7

## 2023-02-18 MED ORDER — ASPIRIN 81 MG PO CHEW
324.0000 mg | CHEWABLE_TABLET | Freq: Once | ORAL | Status: AC
  Administered 2023-02-18: 324 mg via ORAL
  Filled 2023-02-18: qty 4

## 2023-02-18 MED ORDER — LEVOTHYROXINE SODIUM 100 MCG PO TABS
100.0000 ug | ORAL_TABLET | Freq: Every day | ORAL | Status: DC
  Administered 2023-02-18: 100 ug via ORAL
  Filled 2023-02-18: qty 2

## 2023-02-18 MED ORDER — ATORVASTATIN CALCIUM 20 MG PO TABS
40.0000 mg | ORAL_TABLET | Freq: Every day | ORAL | Status: DC
  Administered 2023-02-18: 40 mg via ORAL
  Filled 2023-02-18: qty 2

## 2023-02-18 MED ORDER — SODIUM CHLORIDE 0.9% FLUSH
3.0000 mL | Freq: Two times a day (BID) | INTRAVENOUS | Status: DC
  Administered 2023-02-18 – 2023-02-19 (×2): 3 mL via INTRAVENOUS

## 2023-02-18 MED ORDER — PANTOPRAZOLE SODIUM 40 MG PO TBEC
40.0000 mg | DELAYED_RELEASE_TABLET | Freq: Every day | ORAL | Status: DC
  Administered 2023-02-18: 40 mg via ORAL
  Filled 2023-02-18: qty 1

## 2023-02-18 MED ORDER — HEPARIN (PORCINE) 25000 UT/250ML-% IV SOLN
950.0000 [IU]/h | INTRAVENOUS | Status: DC
  Administered 2023-02-18: 750 [IU]/h via INTRAVENOUS
  Administered 2023-02-19: 950 [IU]/h via INTRAVENOUS
  Filled 2023-02-18 (×2): qty 250

## 2023-02-18 MED ORDER — ONDANSETRON HCL 4 MG/2ML IJ SOLN: 4.0000 mg | Freq: Four times a day (QID) | INTRAMUSCULAR | Status: DC | PRN

## 2023-02-18 MED ORDER — NITROGLYCERIN 0.4 MG SL SUBL: 0.4000 mg | SUBLINGUAL_TABLET | SUBLINGUAL | Status: DC | PRN

## 2023-02-18 NOTE — ED Notes (Signed)
Trop 163 Brian from lab 0130 md

## 2023-02-18 NOTE — ED Notes (Signed)
Pt given lunch tray.

## 2023-02-18 NOTE — ED Notes (Signed)
Pt up to use restroom, steady on feet. Standby assist required. Pt returned to bed without issue.

## 2023-02-18 NOTE — Assessment & Plan Note (Addendum)
Not currently on any meds Starting metoprolol 12.5 twice daily.  BP was slightly elevated at 165/78

## 2023-02-18 NOTE — H&P (Signed)
History and Physical    Patient: Mary Hoover ZOX:[id number] DOB: [birth year] DOA: 02/18/2023 DOS: the patient was seen and examined on 02/18/2023 PCP: Mary Carrow, DO  Patient coming from: Home  Chief Complaint:  Chief Complaint  Patient presents with   Shortness of Breath   Chest Pain    HPI: Mary Hoover is a 78 y.o. female with medical history significant for NSCLC s/p radiation,cervical cancer s/p chemoradiation/brachytherapy completed 2021, COPD, CAD, PAD, carotid endarterectomy, HTN, hypothyroidism and tobacco use disorder, family history of premature CAD (brother died in 42s of MI) who presents to the ED with a 2 to 3-week history of intermittent exertional shortness of breath and chest heaviness radiating to right arm usually short-lived, relieved with rest.  It is associated with lightheadedness, nausea and diaphoresis and she vomited on 1 occasion.  Denies lower extremity pain or swelling.  Denies cough fever chills.  Patient states she saw her PCP 2 weeks prior and EKG was normal.  O2 sat in the office was 86 and she was given home O2 for as needed use.  She was also treated for vertigo.   Following a recurrent episode tonight she decided to come into the ED to be evaluated. ED course and data review: BP 162/76, afebrile, pulse 88-90 with respirations 20 and O2 sat of 100% on room air. First troponin elevated at 93-->163.  CBC and BMP unremarkable. EKG, personally viewed and interpreted showing NSR at 100 with LVH question ST depression lateral leads. Chest x-ray showing chronic COPD changes, otherwise nonacute Patient given chewable aspirin 324 mg and started on a heparin bolus and infusion. Hospitalist consulted for admission for possible NSTEMI.   Review of Systems: As mentioned in the history of present illness. All other systems reviewed and are negative.  Past Medical History:  Diagnosis Date   Arthritis    Cancer (HCC)    Carotid atherosclerosis     Carotid bruit    Cervical cancer (HCC)    COPD (chronic obstructive pulmonary disease) (HCC)    emphysema   Coronary artery disease    Family history of adverse reaction to anesthesia    paternal grandfather died during surgery-pt unaware what happened   GERD (gastroesophageal reflux disease)    Hyperlipidemia    Hypertension    Hyponatremia 02/27/2019   Hypothyroidism    Infected cat bite 1980s   was hospitalized   Irregular heartbeat    Medical history non-contributory    Peripheral vascular disease (HCC)    Renal insufficiency    Tobacco use    Past Surgical History:  Procedure Laterality Date   CAROTID ENDARTERECTOMY Right Oct. 2015   Dr. Wyn Hoover   CAROTID STENOSIS Left April 2016   carotid stenosis surgery   COLONOSCOPY WITH PROPOFOL N/A 11/08/2016   Procedure: COLONOSCOPY WITH PROPOFOL;  Surgeon: Mary Mood, MD;  Location: [facility] ENDOSCOPY;  Service: Gastroenterology;  Laterality: N/A;   ESOPHAGOGASTRODUODENOSCOPY (EGD) WITH PROPOFOL N/A 11/08/2016   Procedure: ESOPHAGOGASTRODUODENOSCOPY (EGD) WITH PROPOFOL;  Surgeon: Mary Mood, MD;  Location: [facility] ENDOSCOPY;  Service: Gastroenterology;  Laterality: N/A;   FLEXIBLE BRONCHOSCOPY N/A 12/17/2014   Procedure: FLEXIBLE BRONCHOSCOPY;  Surgeon: Mary Katos, MD;  Location: [facility] ORS;  Service: Pulmonary;  Laterality: N/A;   INCISION AND DRAINAGE / EXCISION THYROGLOSSAL CYST  April 2011   PORTA CATH INSERTION N/A 03/03/2021   Procedure: PORTA CATH INSERTION;  Surgeon: Mary Needy, MD;  Location: [facility] INVASIVE CV LAB;  Service: Cardiovascular;  Laterality: N/A;  VIDEO BRONCHOSCOPY WITH ENDOBRONCHIAL ULTRASOUND N/A 01/12/2021   Procedure: VIDEO BRONCHOSCOPY WITH ENDOBRONCHIAL ULTRASOUND;  Surgeon: Mary Saner, MD;  Location: [facility] ORS;  Service: Cardiopulmonary;  Laterality: N/A;   Social History:  reports that she has been smoking cigarettes. She has a 25 pack-year smoking history. She has never used smokeless tobacco. She  reports that she does not drink alcohol and does not use drugs.  Allergies  Allergen Reactions   Atorvastatin Other (See Comments)    dizziness    Family History  Problem Relation Age of Onset   Congestive Heart Failure Mother    Heart disease Mother    Hypertension Mother    Hypertension Sister    Diabetes Sister    Heart disease Sister    Heart disease Maternal Uncle    Heart disease Maternal Grandmother    Stroke Maternal Grandfather    Cancer Brother        liver, lung   Heart disease Brother    Hypertension Brother    Heart attack Brother    COPD Neg Hx    Breast cancer Neg Hx     Prior to Admission medications   Medication Sig Start Date End Date Taking? Authorizing Provider  acetaminophen (TYLENOL) 500 MG tablet Take 500 mg by mouth every 6 (six) hours as needed for moderate pain.    [provider]  alendronate (FOSAMAX) 70 MG tablet TAKE 1 TABLET EVERY 7 DAYS . TAKE WITH A FULL GLASS OF WATER ON AN EMPTY STOMACH. 02/10/22   Mary Hoover P, DO  clopidogrel (PLAVIX) 75 MG tablet Take 1 tablet (75 mg total) by mouth daily. 09/25/22   Hoover, Mary P, DO  cyclobenzaprine (FLEXERIL) 10 MG tablet Take 1 tablet (10 mg total) by mouth at bedtime. 09/25/22   Hoover, Mary P, DO  Fluticasone-Umeclidin-Vilant (TRELEGY ELLIPTA) 100-62.5-25 MCG/ACT AEPB Inhale 1 puff into the lungs daily. 04/03/22   Hoover, Mary P, DO  gabapentin (NEURONTIN) 100 MG capsule Take 1 capsule (100 mg total) by mouth 3 (three) times daily. 09/25/22   Hoover, Mary P, DO  Iron-Vitamin C 65-125 MG TABS Take 1 tablet by mouth daily. 04/12/22   Mary Patience, MD  levothyroxine (SYNTHROID) 100 MCG tablet Take 1 tablet (100 mcg total) by mouth daily. 02/02/23   Hoover, Mary P, DO  lidocaine (LIDODERM) 5 % Place 1 patch onto the skin daily. Remove & Discard patch within 12 hours or as directed by MD 03/25/21   Mary Hoover P, DO  lidocaine-prilocaine (EMLA) cream Apply 1 application topically as needed.  03/14/21   Mary Patience, MD  loperamide (IMODIUM) 2 MG capsule Take 1 capsule (2 mg total) by mouth See admin instructions. Initial: 4 mg,the 2 mg after each loose bowel movements.  maximum: 16 mg/day 08/09/22   Mary Patience, MD  Multiple Vitamin (MULTIVITAMIN) tablet Take 1 tablet by mouth daily.    [provider]  naproxen (NAPROSYN) 500 MG tablet Take 1 tablet (500 mg total) by mouth at bedtime. 09/25/22   Hoover, Mary P, DO  omeprazole (PRILOSEC) 20 MG capsule Take 1 capsule (20 mg total) by mouth daily. 04/03/22   Hoover, Mary P, DO  ondansetron (ZOFRAN) 8 MG tablet Take 1 tablet (8 mg total) by mouth every 8 (eight) hours as needed for refractory nausea / vomiting. Start on day 3 after chemo. 04/03/22   Hoover, Mary P, DO  prochlorperazine (COMPAZINE) 10 MG tablet Take 1 tablet (10 mg total) by mouth every 6 (  six) hours as needed for nausea or vomiting. 01/04/22   Mary Patience, MD  traMADol (ULTRAM) 50 MG tablet Take 1 tablet (50 mg total) by mouth daily as needed. 09/25/22   Hoover, Mary P, DO  vitamin B-12 (CYANOCOBALAMIN) 500 MCG tablet Take 500 mcg by mouth daily.    [provider]    Physical Exam: Vitals:   02/17/23 2045 02/17/23 2046 02/18/23 0100  BP: (!) 162/76  (!) 165/78  Pulse: 90  88  Resp: 20  14  Temp: 98.1 F (36.7 C)  98.3 F (36.8 C)  TempSrc: Oral    SpO2: 100%  100%  Weight:  60.8 kg    Physical Exam Vitals and nursing note reviewed.  Constitutional:      General: She is not in acute distress. HENT:     Head: Normocephalic and atraumatic.  Cardiovascular:     Rate and Rhythm: Normal rate and regular rhythm.     Heart sounds: Normal heart sounds.  Pulmonary:     Effort: Pulmonary effort is normal.     Breath sounds: Normal breath sounds.  Abdominal:     Palpations: Abdomen is soft.     Tenderness: There is no abdominal tenderness.  Neurological:     Mental Status: Mental status is at baseline.     Labs on Admission: I have personally  reviewed following labs and imaging studies  CBC: Recent Labs  Lab 02/16/23 0837 02/17/23 2228  WBC 5.4 7.8  NEUTROABS 3.9  --   HGB 11.2* 12.1  HCT 32.7* 35.1*  MCV 95.9 95.1  PLT 197 223   Basic Metabolic Panel: Recent Labs  Lab 02/16/23 0837 02/17/23 2228  NA 133* 136  K 3.9 4.5  CL 102 101  CO2 24 24  GLUCOSE 100* 117*  BUN 14 19  CREATININE 0.79 0.96  CALCIUM 8.8* 9.9   GFR: Estimated Creatinine Clearance: 48.6 mL/min (by C-G formula based on SCr of 0.96 mg/dL). Liver Function Tests: Recent Labs  Lab 02/16/23 0837  AST 20  ALT 18  ALKPHOS 71  BILITOT 0.5  PROT 6.7  ALBUMIN 3.8   No results for input(s): "LIPASE", "AMYLASE" in the last 168 hours. No results for input(s): "AMMONIA" in the last 168 hours. Coagulation Profile: No results for input(s): "INR", "PROTIME" in the last 168 hours. Cardiac Enzymes: No results for input(s): "CKTOTAL", "CKMB", "CKMBINDEX", "TROPONINI" in the last 168 hours. BNP (last 3 results) No results for input(s): "PROBNP" in the last 8760 hours. HbA1C: No results for input(s): "HGBA1C" in the last 72 hours. CBG: No results for input(s): "GLUCAP" in the last 168 hours. Lipid Profile: No results for input(s): "CHOL", "HDL", "LDLCALC", "TRIG", "CHOLHDL", "LDLDIRECT" in the last 72 hours. Thyroid Function Tests: No results for input(s): "TSH", "T4TOTAL", "FREET4", "T3FREE", "THYROIDAB" in the last 72 hours. Anemia Panel: Recent Labs    02/16/23 0837  FERRITIN 107  TIBC 308  IRON 74  RETICCTPCT 1.7   Urine analysis:    Component Value Date/Time   COLORURINE YELLOW (A) 02/12/2022 1900   APPEARANCEUR Clear 04/03/2022 1322   LABSPEC >1.046 (H) 02/12/2022 1900   PHURINE 5.0 02/12/2022 1900   GLUCOSEU Negative 04/03/2022 1322   HGBUR SMALL (A) 02/12/2022 1900   BILIRUBINUR Negative 04/03/2022 1322   KETONESUR NEGATIVE 02/12/2022 1900   PROTEINUR Negative 04/03/2022 1322   PROTEINUR NEGATIVE 02/12/2022 1900   NITRITE  Negative 04/03/2022 1322   NITRITE NEGATIVE 02/12/2022 1900   LEUKOCYTESUR 1+ (A) 04/03/2022 1322  LEUKOCYTESUR TRACE (A) 02/12/2022 1900    Radiological Exams on Admission: DG Chest 2 View Result Date: 02/17/2023 CLINICAL DATA:  Shortness of breath, chest pressure EXAM: CHEST - 2 VIEW COMPARISON:  02/01/2023 FINDINGS: Left Port-A-Cath remains in place, unchanged. There is hyperinflation of the lungs compatible with COPD. Right upper lobe scarring is stable. No acute confluent opacities or effusions. Heart mediastinal contours within normal limits. Aortic atherosclerosis. No acute bony abnormality. IMPRESSION: COPD/chronic changes. No active disease. Electronically Signed   By: Charlett Nose M.D.   On: 02/17/2023 21:12   CT Chest W Contrast Result Date: 02/16/2023 CLINICAL DATA:  Lung cancer follow-up. * Tracking Code: BO * EXAM: CT CHEST WITH CONTRAST TECHNIQUE: Multidetector CT imaging of the chest was performed during intravenous contrast administration. RADIATION DOSE REDUCTION: This exam was performed according to the departmental dose-optimization program which includes automated exposure control, adjustment of the mA and/or kV according to patient size and/or use of iterative reconstruction technique. CONTRAST:  75mL OMNIPAQUE IOHEXOL 300 MG/ML  SOLN COMPARISON:  11/09/2022 FINDINGS: Cardiovascular: Heart size is normal. Aortic atherosclerosis. Coronary artery atherosclerotic calcifications. No pericardial effusion. Mediastinum/Nodes: Thyroid gland, cervix, and esophagus are unremarkable. Previously referenced subcarinal node measures 0.8 cm, image 75/2. Previously 0.9 cm. Right hilar node measures 0.9 cm, image 63/2. Previously 1 cm. Lungs/Pleura: Moderate to severe changes of emphysema. Fibrosis and architectural distortion within the right upper lobe is again identified and appears similar to the previous exam. There are no specific findings within this area to suggest locally recurrent tumor.  Solid nodule within the posterior left lower lobe measures 5 mm, image 102/4. Unchanged from previous exam. Calcified granuloma noted in posterior right base. No new or enlarging lung nodules. Upper Abdomen: No acute abnormality. Calcified granuloma within the medial segment of left hepatic lobe. Normal adrenal glands. Cyst off the posterior cortex of the right kidney is unchanged measuring 1 cm, image 177/2. Musculoskeletal: Degenerative changes are identified within the thoracic spine. No acute or suspicious osseous findings. IMPRESSION: 1. Stable post treatment changes within the right upper lobe. No specific findings to suggest locally recurrent tumor. 2. Stable 5 mm solid nodule within the posterior left lower lobe. 3. Coronary artery calcifications. 4. Aortic Atherosclerosis (ICD10-I70.0) and Emphysema (ICD10-J43.9). Electronically Signed   By: Signa Kell M.D.   On: 02/16/2023 13:25     Data Reviewed: Relevant notes from primary care and specialist visits, past discharge summaries as available in EHR, including Care Everywhere. Prior diagnostic testing as pertinent to current admission diagnoses Updated medications and problem lists for reconciliation ED course, including vitals, labs, imaging, treatment and response to treatment Triage notes, nursing and pharmacy notes and ED provider's notes Notable results as noted in HPI   Assessment and Plan: * NSTEMI (non-ST elevated myocardial infarction) (HCC) CAD Family history of premature coronary artery disease High risk chest pain given history of CAD, PVD, prior endarterectomy, tobacco use, family history: Brother died of MI in 67s Troponin 63-->163,  - Continue heparin started in the emergency room - Continue home Plavix, statin added as well as metoprolol -Nitroglycerin sublingual as needed chest pain - Cardiology consult -Will keep n.p.o. in case of cath in the a.m.  Peripheral vascular disease (HCC) History of carotid  endarterectomy Continue Plavix.  Not currently on a statin  Essential hypertension Not currently on any meds Starting metoprolol 12.5 twice daily.  BP was slightly elevated at 165/78   Tobacco use Nicotine patch and tobacco cessation counseling  COPD (chronic obstructive  pulmonary disease) (HCC) Not acutely exacerbated Continue home inhalers DuoNebs as needed  History of cervical cancer s/p chemoradiation/brachytherapy completed 2021  History of lung cancer Pulmonary nodule S/p radiation Lung nodule being surveilled by oncology-had chest CT on 02/16/2023 showed stable findings  Hypothyroidism Continue levothyroxine     DVT prophylaxis: heparin  Consults: cardiology, [facility]  Advance Care Planning:   Code Status: Prior   Family Communication: none  Disposition Plan: Back to previous home environment  Severity of Illness: The appropriate patient status for this patient is OBSERVATION. Observation status is judged to be reasonable and necessary in order to provide the required intensity of service to ensure the patient's safety. The patient's presenting symptoms, physical exam findings, and initial radiographic and laboratory data in the context of their medical condition is felt to place them at decreased risk for further clinical deterioration. Furthermore, it is anticipated that the patient will be medically stable for discharge from the hospital within 2 midnights of admission.   Author: Andris Baumann, MD 02/18/2023 1:34 AM  For on call review [email or web address].

## 2023-02-18 NOTE — Assessment & Plan Note (Addendum)
s/p chemoradiation/brachytherapy completed 2021

## 2023-02-18 NOTE — Assessment & Plan Note (Addendum)
CAD Family history of premature coronary artery disease High risk chest pain given history of CAD, PVD, prior endarterectomy, tobacco use, family history: Brother died of MI in 49s Troponin 63-->163,  - Continue heparin started in the emergency room - Continue home Plavix, statin added as well as metoprolol -Nitroglycerin sublingual as needed chest pain - Cardiology consult -Will keep n.p.o. in case of cath in the a.m.

## 2023-02-18 NOTE — Assessment & Plan Note (Deleted)
s/p chemoradiation/brachytherapy completed 2021 Last seen for surveillance 12/2022

## 2023-02-18 NOTE — ED Provider Notes (Signed)
[facility] Provider Note    Event Date/Time   First MD Initiated Contact with Patient 02/18/23 0010     (approximate)   History   Chief Complaint Shortness of Breath and Chest Pain   HPI  Mary Hoover is a 78 y.o. female with past medical history of hypertension, hyperlipidemia, COPD, and CKD who presents to the ED complaining of shortness of breath and chest pain.  Patient reports that for the past 2 weeks she has been having intermittent episodes of chest pressure and heaviness associated with difficulty breathing.  Symptoms can come on at any time, are not associated with exertion.  Episodes typically last for about 30 minutes before resolving and she had a similar episode again this evening that has since resolved.  She denies any ongoing chest pain or difficulty breathing at the time of my assessment.  She denies any cardiac history.     Physical Exam   Triage Vital Signs: ED Triage Vitals  Encounter Vitals Group     BP 02/17/23 2045 (!) 162/76     Systolic BP Percentile --      Diastolic BP Percentile --      Pulse Rate 02/17/23 2045 90     Resp 02/17/23 2045 20     Temp 02/17/23 2045 98.1 F (36.7 C)     Temp Source 02/17/23 2045 Oral     SpO2 02/17/23 2045 100 %     Weight 02/17/23 2046 134 lb (60.8 kg)     Height --      Head Circumference --      Peak Flow --      Pain Score --      Pain Loc --      Pain Education --      Exclude from Growth Chart --     Most recent vital signs: Vitals:   02/17/23 2045 02/18/23 0100  BP: (!) 162/76 (!) 165/78  Pulse: 90 88  Resp: 20 14  Temp: 98.1 F (36.7 C)   SpO2: 100% 100%    Constitutional: Alert and oriented. Eyes: Conjunctivae are normal. Head: Atraumatic. Nose: No congestion/rhinnorhea. Mouth/Throat: Mucous membranes are moist.  Cardiovascular: Normal rate, regular rhythm. Grossly normal heart sounds.  2+ radial pulses bilaterally. Respiratory: Normal respiratory effort.   No retractions. Lungs CTAB. Gastrointestinal: Soft and nontender. No distention. Musculoskeletal: No lower extremity tenderness nor edema.  Neurologic:  Normal speech and language. No gross focal neurologic deficits are appreciated.    ED Results / Procedures / Treatments   Labs (all labs ordered are listed, but only abnormal results are displayed) Labs Reviewed  BASIC METABOLIC PANEL - Abnormal; Notable for the following components:      Result Value   Glucose, Bld 117 (*)    All other components within normal limits  CBC - Abnormal; Notable for the following components:   RBC 3.69 (*)    HCT 35.1 (*)    All other components within normal limits  TROPONIN I (HIGH SENSITIVITY) - Abnormal; Notable for the following components:   Troponin I (High Sensitivity) 93 (*)    All other components within normal limits  D-DIMER, QUANTITATIVE  TROPONIN I (HIGH SENSITIVITY)     EKG  ED ECG REPORT I, Chesley Noon, the attending physician, personally viewed and interpreted this ECG.   Date: 02/18/2023  EKG Time: 20:42  Rate: 89  Rhythm: normal sinus rhythm  Axis: RAD  Intervals:none  ST&T Change: Lateral ST depressions  ED ECG REPORT I, Chesley Noon, the attending physician, personally viewed and interpreted this ECG.   Date: 02/18/2023  EKG Time: 00:18  Rate: 100  Rhythm: normal sinus rhythm  Axis: Normal  Intervals:none  ST&T Change: Lateral ST depressions improved   RADIOLOGY Chest x-ray reviewed and interpreted by me with no infiltrate, edema, or effusion.  PROCEDURES:  Critical Care performed: Yes, see critical care procedure note(s)  .Critical Care  Performed by: Chesley Noon, MD Authorized by: Chesley Noon, MD   Critical care provider statement:    Critical care time (minutes):  30   Critical care time was exclusive of:  Separately billable procedures and treating other patients and teaching time   Critical care was necessary to treat or prevent  imminent or life-threatening deterioration of the following conditions:  Cardiac failure   Critical care was time spent personally by me on the following activities:  Development of treatment plan with patient or surrogate, discussions with consultants, evaluation of patient's response to treatment, examination of patient, ordering and review of laboratory studies, ordering and review of radiographic studies, ordering and performing treatments and interventions, pulse oximetry, re-evaluation of patient's condition and review of old charts   I assumed direction of critical care for this patient from another provider in my specialty: no     Care discussed with: admitting provider      MEDICATIONS ORDERED IN ED: Medications  heparin ADULT infusion 100 units/mL (25000 units/251mL) (750 Units/hr Intravenous New Bag/Given 02/18/23 0109)  heparin bolus via infusion 3,650 Units (3,650 Units Intravenous Bolus from Bag 02/18/23 0107)  aspirin chewable tablet 324 mg (324 mg Oral Given 02/18/23 0114)     IMPRESSION / MDM / ASSESSMENT AND PLAN / ED COURSE  I reviewed the triage vital signs and the nursing notes.                              78 y.o. female with past medical history of hypertension, hyperlipidemia, COPD, and CKD who presents to the ED complaining of intermittent episodes of chest pressure and shortness of breath for the past 2 weeks.  Patient's presentation is most consistent with acute presentation with potential threat to life or bodily function.  Differential diagnosis includes, but is not limited to, ACS, PE, pneumonia, pneumothorax, musculoskeletal pain, GERD, anxiety.  Patient nontoxic-appearing and in no acute distress, vital signs are unremarkable.  Initial EKG with lateral ST depressions, however symptoms have since resolved and repeat EKG with near resolution of this, no ST elevation noted.  Initial troponin elevated at 93, patient with no CKD to account for this and no history of  elevated troponin.  We will trend troponin, but given abnormal EKG and her symptoms, I am concerned for ACS and we will start on heparin, also give loading dose of aspirin.  Additional labs are reassuring with no significant anemia, leukocytosis, lecture abnormality, or AKI.  Chest x-ray is unremarkable and low suspicion for PE or dissection given resolution of symptoms.  Case discussed with hospitalist for admission.      FINAL CLINICAL IMPRESSION(S) / ED DIAGNOSES   Final diagnoses:  NSTEMI (non-ST elevated myocardial infarction) (HCC)     Rx / DC Orders   ED Discharge Orders     None        Note:  This document was prepared using Dragon voice recognition software and may include unintentional dictation errors.   Chesley Noon, MD 02/18/23 [phone]

## 2023-02-18 NOTE — Progress Notes (Signed)
PHARMACY - ANTICOAGULATION CONSULT NOTE  Pharmacy Consult for Heparin  Indication: chest pain/ACS  Allergies  Allergen Reactions   Atorvastatin Other (See Comments)    dizziness    Patient Measurements: Weight: 60.8 kg (134 lb) Heparin Dosing Weight: 60.8 kg   Vital Signs: Temp: 98 F (36.7 C) (01/19 0822) Temp Source: Oral (01/19 0822) BP: 130/65 (01/19 0700) Pulse Rate: 71 (01/19 0700)  Labs: Recent Labs    02/16/23 0837 02/17/23 2228 02/18/23 0019 02/18/23 0813  HGB 11.2* 12.1  --   --   HCT 32.7* 35.1*  --   --   PLT 197 223  --   --   HEPARINUNFRC  --   --   --  0.26*  CREATININE 0.79 0.96  --   --   TROPONINIHS  --  93* 163* 465*    Estimated Creatinine Clearance: 48.6 mL/min (by C-G formula based on SCr of 0.96 mg/dL).   Medical History: Past Medical History:  Diagnosis Date   Arthritis    Cancer (HCC)    Carotid atherosclerosis    Carotid bruit    Cervical cancer (HCC)    COPD (chronic obstructive pulmonary disease) (HCC)    emphysema   Coronary artery disease    Family history of adverse reaction to anesthesia    paternal grandfather died during surgery-pt unaware what happened   GERD (gastroesophageal reflux disease)    Hyperlipidemia    Hypertension    Hyponatremia 02/27/2019   Hypothyroidism    Infected cat bite 1980s   was hospitalized   Irregular heartbeat    Medical history non-contributory    Peripheral vascular disease (HCC)    Renal insufficiency    Tobacco use     Medications:  (Not in a hospital admission)   Assessment: Pharmacy consulted to dose heparin in this 78 year old female admitted with ACS/NSTEMI.  No prior anticoag noted.  CrCl = 48.6 ml/min   1/19 0813  HL 0.26 Subtherapeutic   Goal of Therapy:  Heparin level 0.3-0.7 units/ml Monitor platelets by anticoagulation protocol: Yes   Plan:  1/19 0813  HL 0.26 Subtherapeutic Will order heparin bolus of 900 units x 1 and increase drip  From 750 units/hr to  850 units/hr Check anti-Xa level in 8 hours after rate change and daily while on heparin Continue to monitor H&H and platelets  Jaeanna Mccomber A 02/18/2023,9:06 AM

## 2023-02-18 NOTE — Assessment & Plan Note (Signed)
Continue levothyroxine 

## 2023-02-18 NOTE — ED Notes (Signed)
Cardiology at bedside.

## 2023-02-18 NOTE — Consult Note (Signed)
[facility]  CARDIOLOGY CONSULT NOTE  Patient ID: Mary Hoover MRN: [id number] DOB/AGE: [birth year] 78 y.o.  Admit date: 02/18/2023 Referring Physician Dr. Fran Lowes Primary Physician Dorcas Carrow, DO Primary Cardiologist None Reason for Consultation NSTEMI  HPI: 78 year old female with past medical history significant for non-small cell lung cancer status post radiation, cervical cancer status post chemoradiation/brachytherapy, COPD, CAD, PAD, hypertension, tobacco use disorder, family history of CAD and carotid endarterectomy.  She presented to hospital with chest discomfort and our service is consulted for NSTEMI.  Patient details that in last 3 weeks she is having symptoms of midsternal chest tightness along with shortness of breath and right elbow discomfort.  Lately episodes have been happening with minimal exertion.  Yesterday while getting dinner ready she had this discomfort which made her worried and came to ED for further evaluation.  EKG showed mild sinus tachycardia with ST depression in anterolateral leads.  Troponin elevated 93--83--465.  She is currently chest pain-free and resting comfortably in bed.  No palpitation, dizziness or syncope.  Review of systems complete and found to be negative unless listed above     Past Medical History:  Diagnosis Date   Arthritis    Cancer (HCC)    Carotid atherosclerosis    Carotid bruit    Cervical cancer (HCC)    COPD (chronic obstructive pulmonary disease) (HCC)    emphysema   Coronary artery disease    Family history of adverse reaction to anesthesia    paternal grandfather died during surgery-pt unaware what happened   GERD (gastroesophageal reflux disease)    Hyperlipidemia    Hypertension    Hyponatremia 02/27/2019   Hypothyroidism    Infected cat bite 1980s   was hospitalized   Irregular heartbeat    Medical history non-contributory    Peripheral vascular disease (HCC)    Renal insufficiency    Tobacco use      Past Surgical History:  Procedure Laterality Date   CAROTID ENDARTERECTOMY Right Oct. 2015   Dr. Wyn Quaker   CAROTID STENOSIS Left April 2016   carotid stenosis surgery   COLONOSCOPY WITH PROPOFOL N/A 11/08/2016   Procedure: COLONOSCOPY WITH PROPOFOL;  Surgeon: Wyline Mood, MD;  Location: [facility] ENDOSCOPY;  Service: Gastroenterology;  Laterality: N/A;   ESOPHAGOGASTRODUODENOSCOPY (EGD) WITH PROPOFOL N/A 11/08/2016   Procedure: ESOPHAGOGASTRODUODENOSCOPY (EGD) WITH PROPOFOL;  Surgeon: Wyline Mood, MD;  Location: [facility] ENDOSCOPY;  Service: Gastroenterology;  Laterality: N/A;   FLEXIBLE BRONCHOSCOPY N/A 12/17/2014   Procedure: FLEXIBLE BRONCHOSCOPY;  Surgeon: Merwyn Katos, MD;  Location: [facility] ORS;  Service: Pulmonary;  Laterality: N/A;   INCISION AND DRAINAGE / EXCISION THYROGLOSSAL CYST  April 2011   PORTA CATH INSERTION N/A 03/03/2021   Procedure: PORTA CATH INSERTION;  Surgeon: Annice Needy, MD;  Location: [facility] INVASIVE CV LAB;  Service: Cardiovascular;  Laterality: N/A;   VIDEO BRONCHOSCOPY WITH ENDOBRONCHIAL ULTRASOUND N/A 01/12/2021   Procedure: VIDEO BRONCHOSCOPY WITH ENDOBRONCHIAL ULTRASOUND;  Surgeon: Salena Saner, MD;  Location: [facility] ORS;  Service: Cardiopulmonary;  Laterality: N/A;    (Not in a hospital admission)  Social History   Socioeconomic History   Marital status: Married    Spouse name: Rayna Sexton   Number of children: Not on file   Years of education: Not on file   Highest education level: 9th grade  Occupational History   Occupation: retired   Tobacco Use   Smoking status: Every Day    Current packs/day: 0.50    Average packs/day: 0.5 packs/day for  50.0 years (25.0 ttl pk-yrs)    Types: Cigarettes   Smokeless tobacco: Never   Tobacco comments:    0.5 PPD 02/10/2021  Vaping Use   Vaping status: Former  Substance and Sexual Activity   Alcohol use: No    Alcohol/week: 0.0 standard drinks of alcohol   Drug use: No   Sexual activity: Yes  Other Topics Concern    Not on file  Social History Narrative   Lives at home with husband   Social Drivers of Corporate investment banker Strain: Low Risk  (03/15/2021)   Overall Financial Resource Strain (CARDIA)    Difficulty of Paying Living Expenses: Not hard at all  Food Insecurity: No Food Insecurity (03/15/2021)   Hunger Vital Sign    Worried About Running Out of Food in the Last Year: Never true    Ran Out of Food in the Last Year: Never true  Transportation Needs: No Transportation Needs (03/15/2021)   PRAPARE - Administrator, Civil Service (Medical): No    Lack of Transportation (Non-Medical): No  Physical Activity: Inactive (03/15/2021)   Exercise Vital Sign    Days of Exercise per Week: 0 days    Minutes of Exercise per Session: 0 min  Stress: No Stress Concern Present (03/15/2021)   [employer] of Occupational Health - Occupational Stress Questionnaire    Feeling of Stress : Not at all  Social Connections: Moderately Isolated (03/15/2021)   Social Connection and Isolation Panel [NHANES]    Frequency of Communication with Friends and Family: More than three times a week    Frequency of Social Gatherings with Friends and Family: More than three times a week    Attends Religious Services: Never    Database administrator or Organizations: No    Attends Banker Meetings: Never    Marital Status: Married  Catering manager Violence: Not At Risk (03/15/2021)   Humiliation, Afraid, Rape, and Kick questionnaire    Fear of Current or Ex-Partner: No    Emotionally Abused: No    Physically Abused: No    Sexually Abused: No    Family History  Problem Relation Age of Onset   Congestive Heart Failure Mother    Heart disease Mother    Hypertension Mother    Hypertension Sister    Diabetes Sister    Heart disease Sister    Heart disease Maternal Uncle    Heart disease Maternal Grandmother    Stroke Maternal Grandfather    Cancer Brother        liver, lung   Heart  disease Brother    Hypertension Brother    Heart attack Brother    COPD Neg Hx    Breast cancer Neg Hx       Review of systems complete and found to be negative unless listed above      PHYSICAL EXAM  Alert and oriented x 3 No significant murmur No wheeze or crackles No pedal edema   Labs:   Lab Results  Component Value Date   WBC 7.8 02/17/2023   HGB 12.1 02/17/2023   HCT 35.1 (L) 02/17/2023   MCV 95.1 02/17/2023   PLT 223 02/17/2023    Recent Labs  Lab 02/16/23 0837 02/17/23 2228  NA 133* 136  K 3.9 4.5  CL 102 101  CO2 24 24  BUN 14 19  CREATININE 0.79 0.96  CALCIUM 8.8* 9.9  PROT 6.7  --   BILITOT 0.5  --  ALKPHOS 71  --   ALT 18  --   AST 20  --   GLUCOSE 100* 117*   Lab Results  Component Value Date   TROPONINI <0.03 03/07/2016    Lab Results  Component Value Date   CHOL 209 (H) 09/25/2022   CHOL 187 04/03/2022   CHOL 175 02/20/2022   Lab Results  Component Value Date   HDL 54 09/25/2022   HDL 56 04/03/2022   HDL 45 02/20/2022   Lab Results  Component Value Date   LDLCALC 122 (H) 09/25/2022   LDLCALC 108 (H) 04/03/2022   LDLCALC 101 (H) 02/20/2022   Lab Results  Component Value Date   TRIG 191 (H) 09/25/2022   TRIG 130 04/03/2022   TRIG 168 (H) 02/20/2022   No results found for: "CHOLHDL" No results found for: "LDLDIRECT"    Radiology: DG Chest 2 View Result Date: 02/17/2023 CLINICAL DATA:  Shortness of breath, chest pressure EXAM: CHEST - 2 VIEW COMPARISON:  02/01/2023 FINDINGS: Left Port-A-Cath remains in place, unchanged. There is hyperinflation of the lungs compatible with COPD. Right upper lobe scarring is stable. No acute confluent opacities or effusions. Heart mediastinal contours within normal limits. Aortic atherosclerosis. No acute bony abnormality. IMPRESSION: COPD/chronic changes. No active disease. Electronically Signed   By: Charlett Nose M.D.   On: 02/17/2023 21:12   CT Chest W Contrast Result Date:  02/16/2023 CLINICAL DATA:  Lung cancer follow-up. * Tracking Code: BO * EXAM: CT CHEST WITH CONTRAST TECHNIQUE: Multidetector CT imaging of the chest was performed during intravenous contrast administration. RADIATION DOSE REDUCTION: This exam was performed according to the departmental dose-optimization program which includes automated exposure control, adjustment of the mA and/or kV according to patient size and/or use of iterative reconstruction technique. CONTRAST:  75mL OMNIPAQUE IOHEXOL 300 MG/ML  SOLN COMPARISON:  11/09/2022 FINDINGS: Cardiovascular: Heart size is normal. Aortic atherosclerosis. Coronary artery atherosclerotic calcifications. No pericardial effusion. Mediastinum/Nodes: Thyroid gland, cervix, and esophagus are unremarkable. Previously referenced subcarinal node measures 0.8 cm, image 75/2. Previously 0.9 cm. Right hilar node measures 0.9 cm, image 63/2. Previously 1 cm. Lungs/Pleura: Moderate to severe changes of emphysema. Fibrosis and architectural distortion within the right upper lobe is again identified and appears similar to the previous exam. There are no specific findings within this area to suggest locally recurrent tumor. Solid nodule within the posterior left lower lobe measures 5 mm, image 102/4. Unchanged from previous exam. Calcified granuloma noted in posterior right base. No new or enlarging lung nodules. Upper Abdomen: No acute abnormality. Calcified granuloma within the medial segment of left hepatic lobe. Normal adrenal glands. Cyst off the posterior cortex of the right kidney is unchanged measuring 1 cm, image 177/2. Musculoskeletal: Degenerative changes are identified within the thoracic spine. No acute or suspicious osseous findings. IMPRESSION: 1. Stable post treatment changes within the right upper lobe. No specific findings to suggest locally recurrent tumor. 2. Stable 5 mm solid nodule within the posterior left lower lobe. 3. Coronary artery calcifications. 4. Aortic  Atherosclerosis (ICD10-I70.0) and Emphysema (ICD10-J43.9). Electronically Signed   By: Signa Kell M.D.   On: 02/16/2023 13:25   DG Chest 2 View Result Date: 02/01/2023 CLINICAL DATA:  Short of breath, hypoxia EXAM: CHEST - 2 VIEW COMPARISON:  03/29/2021 FINDINGS: Frontal and lateral views of the chest demonstrate stable left chest wall port tip within the superior vena cava. Stable cardiac silhouette. Stable emphysema. Stable linear densities within the right upper lobe, correlating to scarring identified on  prior chest CT. No acute airspace disease, effusion, or pneumothorax. No acute bony abnormalities. IMPRESSION: 1. Emphysema, with stable right upper lobe scarring. No acute intrathoracic process. Electronically Signed   By: Sharlet Salina M.D.   On: 02/01/2023 19:48     ASSESSMENT AND PLAN:  NSTEMI type I Hypertension Tobacco abuse with COPD Peripheral arterial disease History of lung/cervical cancer  Patient currently chest pain-free.  Plan for left heart catheterization+-PCI tomorrow.  Indication/risk/benefit discussed with patient and she is agreeable to proceed. Continue heparin drip Continue aspirin, Plavix.  She is on Plavix at home with peripheral arterial disease Continue statin and metoprolol Echo pending Counseled on smoking cessation  Signed: Kathryne Gin MD 02/18/2023, 12:18 PM

## 2023-02-18 NOTE — ED Notes (Signed)
Trop 163. MD notifed at 0139

## 2023-02-18 NOTE — ED Notes (Signed)
Md  called back and confirmed critical value at 0141

## 2023-02-18 NOTE — ED Notes (Signed)
Pt given cup of water 

## 2023-02-18 NOTE — Assessment & Plan Note (Deleted)
NSTEMI 

## 2023-02-18 NOTE — Progress Notes (Signed)
PHARMACY - ANTICOAGULATION CONSULT NOTE  Pharmacy Consult for Heparin  Indication: chest pain/ACS  Allergies  Allergen Reactions   Atorvastatin Other (See Comments)    dizziness    Patient Measurements: Weight: 60.8 kg (134 lb) Heparin Dosing Weight: 60.8 kg   Vital Signs: Temp: 98.1 F (36.7 C) (01/18 2045) Temp Source: Oral (01/18 2045) BP: 162/76 (01/18 2045) Pulse Rate: 90 (01/18 2045)  Labs: Recent Labs    02/16/23 0837 02/17/23 2228  HGB 11.2* 12.1  HCT 32.7* 35.1*  PLT 197 223  CREATININE 0.79 0.96  TROPONINIHS  --  93*    Estimated Creatinine Clearance: 48.6 mL/min (by C-G formula based on SCr of 0.96 mg/dL).   Medical History: Past Medical History:  Diagnosis Date   Arthritis    Cancer (HCC)    Carotid atherosclerosis    Carotid bruit    Cervical cancer (HCC)    COPD (chronic obstructive pulmonary disease) (HCC)    emphysema   Coronary artery disease    Family history of adverse reaction to anesthesia    paternal grandfather died during surgery-pt unaware what happened   GERD (gastroesophageal reflux disease)    Hyperlipidemia    Hypertension    Hyponatremia 02/27/2019   Hypothyroidism    Infected cat bite 1980s   was hospitalized   Irregular heartbeat    Medical history non-contributory    Peripheral vascular disease (HCC)    Renal insufficiency    Tobacco use     Medications:  (Not in a hospital admission)   Assessment: Pharmacy consulted to dose heparin in this 78 year old female admitted with ACS/NSTEMI.  No prior anticoag noted.  CrCl = 48.6 ml/min   Goal of Therapy:  Heparin level 0.3-0.7 units/ml Monitor platelets by anticoagulation protocol: Yes   Plan:  Give 3650 units bolus x 1 Start heparin infusion at 750 units/hr Check anti-Xa level in 8 hours and daily while on heparin Continue to monitor H&H and platelets  Aaira Oestreicher D 02/18/2023,12:56 AM

## 2023-02-18 NOTE — Assessment & Plan Note (Addendum)
History of carotid endarterectomy Continue Plavix.  Not currently on a statin

## 2023-02-18 NOTE — Progress Notes (Signed)
PHARMACY - ANTICOAGULATION CONSULT NOTE  Pharmacy Consult for Heparin  Indication: chest pain/ACS  Allergies  Allergen Reactions   Atorvastatin Other (See Comments)    dizziness    Patient Measurements: Weight: 60.8 kg (134 lb) Heparin Dosing Weight: 60.8 kg   Vital Signs: Temp: 98.4 F (36.9 C) (01/19 1805) Temp Source: Oral (01/19 1805) BP: 151/58 (01/19 1700) Pulse Rate: 86 (01/19 1700)  Labs: Recent Labs    02/16/23 0837 02/17/23 2228 02/17/23 2228 02/18/23 0019 02/18/23 0813 02/18/23 1658  HGB 11.2* 12.1  --   --   --   --   HCT 32.7* 35.1*  --   --   --   --   PLT 197 223  --   --   --   --   HEPARINUNFRC  --   --   --   --  0.26* 0.25*  CREATININE 0.79 0.96  --   --   --   --   TROPONINIHS  --  93*   < > 163* 465* 611*   < > = values in this interval not displayed.    Estimated Creatinine Clearance: 48.6 mL/min (by C-G formula based on SCr of 0.96 mg/dL).   Medical History: Past Medical History:  Diagnosis Date   Arthritis    Cancer (HCC)    Carotid atherosclerosis    Carotid bruit    Cervical cancer (HCC)    COPD (chronic obstructive pulmonary disease) (HCC)    emphysema   Coronary artery disease    Family history of adverse reaction to anesthesia    paternal grandfather died during surgery-pt unaware what happened   GERD (gastroesophageal reflux disease)    Hyperlipidemia    Hypertension    Hyponatremia 02/27/2019   Hypothyroidism    Infected cat bite 1980s   was hospitalized   Irregular heartbeat    Medical history non-contributory    Peripheral vascular disease (HCC)    Renal insufficiency    Tobacco use     Medications:  (Not in a hospital admission)   Assessment: Pharmacy consulted to dose heparin in this 78 year old female admitted with ACS/NSTEMI.  No prior anticoag noted.  CrCl = 48.6 ml/min   1/19 0813  HL 0.26 Subtherapeutic 1/19 1658  HL 0.25  Subtherapeutic  No issues with infusion reported, no signs/symptoms of  bleeding noted.  Goal of Therapy:  Heparin level 0.3-0.7 units/ml Monitor platelets by anticoagulation protocol: Yes   Plan:  Give heparin bolus of 900 units x1 Increase heparin infusion to 950 units/hour Check Anti-Xa level 8 hours after rate change Monitor CBC and signs/symptoms of bleeding  Thank you for involving pharmacy in this patient's care.   Rockwell Alexandria, PharmD Clinical Pharmacist 02/18/2023 6:13 PM

## 2023-02-18 NOTE — Assessment & Plan Note (Addendum)
 Nicotine patch and tobacco cessation counseling

## 2023-02-18 NOTE — Assessment & Plan Note (Addendum)
Pulmonary nodule S/p radiation Lung nodule being surveilled by oncology-had chest CT on 02/16/2023 showed stable findings

## 2023-02-18 NOTE — Assessment & Plan Note (Signed)
Not acutely exacerbated Continue home inhalers.  DuoNebs as needed 

## 2023-02-19 ENCOUNTER — Encounter
Admission: EM | Disposition: A | Discharge status: Other Acute Inpatient Hospital | Payer: Self-pay | Source: Home / Self Care | Attending: Hospitalist

## 2023-02-19 ENCOUNTER — Encounter: Payer: Self-pay | Admitting: Hospitalist

## 2023-02-19 ENCOUNTER — Ambulatory Visit: Payer: Medicare HMO | Admitting: Family Medicine

## 2023-02-19 ENCOUNTER — Inpatient Hospital Stay
Admit: 2023-02-19 | Discharge: 2023-02-19 | Disposition: A | Discharge status: Home / Self Care | Payer: Medicare HMO | Attending: Student | Admitting: Student

## 2023-02-19 DIAGNOSIS — I214 Non-ST elevation (NSTEMI) myocardial infarction: Secondary | ICD-10-CM | POA: Diagnosis not present

## 2023-02-19 HISTORY — PX: LEFT HEART CATH AND CORONARY ANGIOGRAPHY: CATH118249

## 2023-02-19 LAB — BASIC METABOLIC PANEL
Anion gap: 11 (ref 5–15)
BUN: 14 mg/dL (ref 8–23)
CO2: 23 mmol/L (ref 22–32)
Calcium: 8.8 mg/dL — ABNORMAL LOW (ref 8.9–10.3)
Chloride: 97 mmol/L — ABNORMAL LOW (ref 98–111)
Creatinine, Ser: 0.87 mg/dL (ref 0.44–1.00)
GFR, Estimated: >60 mL/min (ref >60)
Glucose, Bld: 94 mg/dL (ref 70–99)
Potassium: 4 mmol/L (ref 3.5–5.1)
Sodium: 131 mmol/L — ABNORMAL LOW (ref 135–145)

## 2023-02-19 LAB — MAGNESIUM: Magnesium: 1.9 mg/dL (ref 1.7–2.4)

## 2023-02-19 LAB — CBC
HCT: 32.1 % — ABNORMAL LOW (ref 36.0–46.0)
Hemoglobin: 11.3 g/dL — ABNORMAL LOW (ref 12.0–15.0)
MCH: 33.9 pg (ref 26.0–34.0)
MCHC: 35.2 g/dL (ref 30.0–36.0)
MCV: 96.4 fL (ref 80.0–100.0)
Platelets: 173 10*3/uL (ref 150–400)
RBC: 3.33 MIL/uL — ABNORMAL LOW (ref 3.87–5.11)
RDW: 12.6 % (ref 11.5–15.5)
WBC: 5.6 10*3/uL (ref 4.0–10.5)
nRBC: 0 % (ref 0.0–0.2)

## 2023-02-19 LAB — CARDIAC CATHETERIZATION: Cath EF Quantitative: 55 %

## 2023-02-19 LAB — HEPARIN LEVEL (UNFRACTIONATED)
Heparin Unfractionated: 0.39 [IU]/mL (ref 0.30–0.70)
Heparin Unfractionated: 0.1 [IU]/mL — ABNORMAL LOW (ref 0.30–0.70)

## 2023-02-19 SURGERY — LEFT HEART CATH AND CORONARY ANGIOGRAPHY
Anesthesia: Moderate Sedation

## 2023-02-19 MED ORDER — FENTANYL CITRATE (PF) 100 MCG/2ML IJ SOLN
INTRAMUSCULAR | Status: DC | PRN
  Administered 2023-02-19: 25 ug via INTRAVENOUS

## 2023-02-19 MED ORDER — IOHEXOL 300 MG/ML  SOLN
INTRAMUSCULAR | Status: DC | PRN
  Administered 2023-02-19: 50 mL

## 2023-02-19 MED ORDER — SODIUM CHLORIDE 0.9 % IV SOLN: INTRAVENOUS | Status: DC

## 2023-02-19 MED ORDER — ASPIRIN 81 MG PO CHEW
CHEWABLE_TABLET | ORAL | Status: AC
  Filled 2023-02-19: qty 1

## 2023-02-19 MED ORDER — HEPARIN SODIUM (PORCINE) 1000 UNIT/ML IJ SOLN
INTRAMUSCULAR | Status: AC
  Filled 2023-02-19: qty 10

## 2023-02-19 MED ORDER — SODIUM CHLORIDE 0.9 % WEIGHT BASED INFUSION: 1.0000 mL/kg/h | INTRAVENOUS | Status: AC

## 2023-02-19 MED ORDER — LIDOCAINE HCL 1 % IJ SOLN
INTRAMUSCULAR | Status: AC
  Filled 2023-02-19: qty 20

## 2023-02-19 MED ORDER — CHLORHEXIDINE GLUCONATE CLOTH 2 % EX PADS
6.0000 | MEDICATED_PAD | Freq: Every day | CUTANEOUS | Status: DC
  Administered 2023-02-19: 6 via TOPICAL

## 2023-02-19 MED ORDER — NITROGLYCERIN 0.4 MG SL SUBL: 0.4000 mg | SUBLINGUAL_TABLET | SUBLINGUAL | Status: DC | PRN

## 2023-02-19 MED ORDER — ASPIRIN 81 MG PO CHEW
CHEWABLE_TABLET | ORAL | Status: AC
Start: 2023-02-19 — End: ?
  Filled 2023-02-19: qty 1

## 2023-02-19 MED ORDER — PERFLUTREN LIPID MICROSPHERE
1.0000 mL | INTRAVENOUS | Status: AC | PRN
  Administered 2023-02-19: 2 mL via INTRAVENOUS

## 2023-02-19 MED ORDER — ROSUVASTATIN CALCIUM 20 MG PO TABS
40.0000 mg | ORAL_TABLET | Freq: Every day | ORAL | Status: DC
Start: 2023-02-19 — End: 2023-02-20
  Administered 2023-02-19: 40 mg via ORAL
  Filled 2023-02-19: qty 2
  Filled 2023-02-19: qty 4

## 2023-02-19 MED ORDER — HEPARIN SOD (PORK) LOCK FLUSH 10 UNIT/ML IV SOLN
10.0000 [IU] | Freq: Once | INTRAVENOUS | Status: AC
  Administered 2023-02-19: 10 [IU]
  Filled 2023-02-19 (×2): qty 1

## 2023-02-19 MED ORDER — MIDAZOLAM HCL 2 MG/2ML IJ SOLN
INTRAMUSCULAR | Status: AC
  Filled 2023-02-19: qty 2

## 2023-02-19 MED ORDER — HEPARIN (PORCINE) 25000 UT/250ML-% IV SOLN: 1100.0000 [IU]/h | INTRAVENOUS | Status: DC

## 2023-02-19 MED ORDER — ASPIRIN 81 MG PO CHEW
81.0000 mg | CHEWABLE_TABLET | ORAL | Status: AC
  Administered 2023-02-19: 81 mg via ORAL

## 2023-02-19 MED ORDER — HEPARIN (PORCINE) IN NACL 1000-0.9 UT/500ML-% IV SOLN
INTRAVENOUS | Status: AC
  Filled 2023-02-19: qty 1000

## 2023-02-19 MED ORDER — HEPARIN SODIUM (PORCINE) 1000 UNIT/ML IJ SOLN
INTRAMUSCULAR | Status: DC | PRN
  Administered 2023-02-19: 3000 [IU] via INTRAVENOUS

## 2023-02-19 MED ORDER — VERAPAMIL HCL 2.5 MG/ML IV SOLN
INTRAVENOUS | Status: AC
  Filled 2023-02-19: qty 2

## 2023-02-19 MED ORDER — HEPARIN (PORCINE) IN NACL 1000-0.9 UT/500ML-% IV SOLN
INTRAVENOUS | Status: DC | PRN
  Administered 2023-02-19 (×2): 500 mL

## 2023-02-19 MED ORDER — METOPROLOL TARTRATE 25 MG PO TABS: 12.5000 mg | ORAL_TABLET | Freq: Two times a day (BID) | ORAL | Status: DC

## 2023-02-19 MED ORDER — FENTANYL CITRATE (PF) 100 MCG/2ML IJ SOLN
INTRAMUSCULAR | Status: AC
  Filled 2023-02-19: qty 2

## 2023-02-19 MED ORDER — HEPARIN BOLUS VIA INFUSION
1800.0000 [IU] | Freq: Once | INTRAVENOUS | Status: AC
  Administered 2023-02-19: 1800 [IU] via INTRAVENOUS
  Filled 2023-02-19: qty 1800

## 2023-02-19 MED ORDER — VERAPAMIL HCL 2.5 MG/ML IV SOLN
INTRAVENOUS | Status: DC | PRN
  Administered 2023-02-19: 2.5 mg via INTRACORONARY

## 2023-02-19 MED ORDER — ASPIRIN 81 MG PO CHEW: 81.0000 mg | CHEWABLE_TABLET | Freq: Every day | ORAL | Status: DC

## 2023-02-19 MED ORDER — NAPROXEN 500 MG PO TABS: 500.0000 mg | ORAL_TABLET | Freq: Every evening | ORAL | 3 refills | Status: DC | PRN

## 2023-02-19 MED ORDER — HEPARIN (PORCINE) 25000 UT/250ML-% IV SOLN
INTRAVENOUS | Status: AC
  Filled 2023-02-19: qty 250

## 2023-02-19 MED ORDER — LIDOCAINE HCL (PF) 1 % IJ SOLN
INTRAMUSCULAR | Status: DC | PRN
  Administered 2023-02-19: 2 mL

## 2023-02-19 MED ORDER — ROSUVASTATIN CALCIUM 40 MG PO TABS: 40.0000 mg | ORAL_TABLET | Freq: Every day | ORAL | Status: DC

## 2023-02-19 MED ORDER — NICOTINE 14 MG/24HR TD PT24: 14.0000 mg | MEDICATED_PATCH | Freq: Every day | TRANSDERMAL | Status: DC

## 2023-02-19 MED ORDER — ASPIRIN 81 MG PO CHEW
81.0000 mg | CHEWABLE_TABLET | Freq: Every day | ORAL | Status: DC
  Administered 2023-02-19: 81 mg via ORAL

## 2023-02-19 MED ORDER — MIDAZOLAM HCL 2 MG/2ML IJ SOLN
INTRAMUSCULAR | Status: DC | PRN
  Administered 2023-02-19: 1 mg via INTRAVENOUS

## 2023-02-19 MED ORDER — HEPARIN (PORCINE) 25000 UT/250ML-% IV SOLN
1100.0000 [IU]/h | INTRAVENOUS | Status: DC
  Administered 2023-02-19: 950 [IU]/h via INTRAVENOUS
  Administered 2023-02-20: 1100 [IU]/h via INTRAVENOUS
  Filled 2023-02-19: qty 250

## 2023-02-19 SURGICAL SUPPLY — 11 items
CATH 5FR JL3.5 JR4 ANG PIG MP (CATHETERS) IMPLANT
DEVICE RAD TR BAND REGULAR (VASCULAR PRODUCTS) IMPLANT
DRAPE BRACHIAL (DRAPES) IMPLANT
GLIDESHEATH SLEND SS 6F .021 (SHEATH) IMPLANT
GUIDEWIRE INQWIRE 1.5J.035X260 (WIRE) IMPLANT
INQWIRE 1.5J .035X260CM (WIRE) ×1
KIT SYRINGE INJ CVI SPIKEX1 (MISCELLANEOUS) IMPLANT
PACK CARDIAC CATH (CUSTOM PROCEDURE TRAY) ×1 IMPLANT
PROTECTION STATION PRESSURIZED (MISCELLANEOUS) ×1
SET ATX-X65L (MISCELLANEOUS) IMPLANT
STATION PROTECTION PRESSURIZED (MISCELLANEOUS) IMPLANT

## 2023-02-19 NOTE — Progress Notes (Signed)
Transportation contacted. ETA approximately 2 hours.

## 2023-02-19 NOTE — ED Notes (Signed)
Pt transported to Cath Lab

## 2023-02-19 NOTE — Progress Notes (Signed)
[facility] CLINIC CARDIOLOGY PROGRESS NOTE   Patient ID: Mary Hoover MRN: [id number] DOB/AGE: [id number] y.o.  Admit date: 02/18/2023 Referring Physician Dr. Lindajo Royal Primary Physician Dorcas Carrow, DO  Primary Cardiologist none Reason for Consultation NSTEMI  HPI: Mary Hoover is a 78 y.o. female with a past medical history of coronary artery disease, peripheral arterial disease, non-small cell lung cancer s/p radiation, cervical cancer s/p chemoradiation/brachytherapy, COPD, hypertension, tobacco use disorder who presented to the ED on 02/18/2023 for chest discomfort.  Cardiology was consulted for further evaluation.  Interval History:  -Patient seen and examined this morning after LHC.  Denies any chest pain symptoms. -Also denies any shortness of breath, remains on room air. -BP and heart rate remain stable. -Patient aware of results of LHC and requested to be transferred to [facility] for CABG eval.  Review of systems complete and found to be negative unless listed above    Vitals:   02/19/23 1015 02/19/23 1030 02/19/23 1045 02/19/23 1100  BP: 129/67 137/65 (!) 140/68 (!) 144/70  Pulse: 74 76 75   Resp: (!) 23 19 14 20   Temp:      TempSrc:      SpO2: 98% 99% 99%   Weight:      Height:        No intake or output data in the 24 hours ending 02/19/23 1121   PHYSICAL EXAM General: Chronically ill-appearing female, well nourished, in no acute distress. HEENT: Normocephalic and atraumatic. Neck: No JVD.  Lungs: Normal respiratory effort on room air. Clear bilaterally to auscultation. No wheezes, crackles, rhonchi.  Heart: HRRR. Normal S1 and S2 without gallops or murmurs. Radial & DP pulses 2+ bilaterally. Abdomen: Non-distended appearing.  Msk: Normal strength and tone for age. Extremities: No clubbing, cyanosis or edema.   Neuro: Alert and oriented X 3. Psych: Mood appropriate, affect congruent.    LABS: Basic Metabolic Panel: Recent Labs     02/17/23 2228 02/19/23 0232  NA 136 131*  K 4.5 4.0  CL 101 97*  CO2 24 23  GLUCOSE 117* 94  BUN 19 14  CREATININE 0.96 0.87  CALCIUM 9.9 8.8*  MG  --  1.9   Liver Function Tests: No results for input(s): "AST", "ALT", "ALKPHOS", "BILITOT", "PROT", "ALBUMIN" in the last 72 hours. No results for input(s): "LIPASE", "AMYLASE" in the last 72 hours. CBC: Recent Labs    02/17/23 2228 02/19/23 0232  WBC 7.8 5.6  HGB 12.1 11.3*  HCT 35.1* 32.1*  MCV 95.1 96.4  PLT 223 173   Cardiac Enzymes: Recent Labs    02/18/23 0019 02/18/23 0813 02/18/23 1658  TROPONINIHS 163* 465* 611*   BNP: No results for input(s): "BNP" in the last 72 hours. D-Dimer: Recent Labs    02/18/23 0045  DDIMER 0.89*   Hemoglobin A1C: No results for input(s): "HGBA1C" in the last 72 hours. Fasting Lipid Panel: No results for input(s): "CHOL", "HDL", "LDLCALC", "TRIG", "CHOLHDL", "LDLDIRECT" in the last 72 hours. Thyroid Function Tests: No results for input(s): "TSH", "T4TOTAL", "T3FREE", "THYROIDAB" in the last 72 hours.  Invalid input(s): "FREET3" Anemia Panel: No results for input(s): "VITAMINB12", "FOLATE", "FERRITIN", "TIBC", "IRON", "RETICCTPCT" in the last 72 hours.  CARDIAC CATHETERIZATION Result Date: 02/19/2023   Mid RCA lesion is 100% stenosed.   Dist RCA lesion is 100% stenosed.   Dist LM to Ost LAD lesion is 90% stenosed.   Mid LAD lesion is 75% stenosed.   Ost Cx lesion is 99% stenosed.  The left ventricular systolic function is normal.   LV end diastolic pressure is normal.   The left ventricular ejection fraction is 55-65% by visual estimate.   Anticipated discharge date to be determined.   DG Chest 2 View Result Date: 02/17/2023 CLINICAL DATA:  Shortness of breath, chest pressure EXAM: CHEST - 2 VIEW COMPARISON:  02/01/2023 FINDINGS: Left Port-A-Cath remains in place, unchanged. There is hyperinflation of the lungs compatible with COPD. Right upper lobe scarring is stable. No acute  confluent opacities or effusions. Heart mediastinal contours within normal limits. Aortic atherosclerosis. No acute bony abnormality. IMPRESSION: COPD/chronic changes. No active disease. Electronically Signed   By: Charlett Nose M.D.   On: 02/17/2023 21:12     ECHO ordered  TELEMETRY reviewed by me 02/19/23: Sinus rhythm rate 70s  EKG reviewed by me 02/19/23: Sinus rhythm rate 100 bpm, anterolateral ST depression  DATA reviewed by me 02/19/23: last 24h vitals tele labs imaging I/O, hospitalist progress note  Principal Problem:   NSTEMI (non-ST elevated myocardial infarction) (HCC) Active Problems:   History of carotid endarterectomy   Hypothyroidism   Tobacco use   COPD (chronic obstructive pulmonary disease) (HCC)   Essential hypertension   Peripheral vascular disease (HCC)   Coronary artery disease   History of lung cancer   History of cervical cancer    ASSESSMENT AND PLAN: Mary Hoover is a 78 y.o. female with a past medical history of coronary artery disease, peripheral arterial disease, non-small cell lung cancer s/p radiation, cervical cancer s/p chemoradiation/brachytherapy, COPD, hypertension, tobacco use disorder who presented to the ED on 02/18/2023 for chest discomfort.  Cardiology was consulted for further evaluation.  # NSTEMI # Multivessel coronary artery disease # Hypertension # Tobacco use Patient initially presented with chest pain and shortness of breath, had multiple episodes prior to coming to the ED.  Troponin checked and trended 93 > 163 > 465 > 611.  EKG demonstrated sinus rhythm with anterolateral ST depression.  She was started on heparin infusion in the ED and was given 325 mg of aspirin.  LHC this morning revealed severe multivessel coronary artery disease with 100% mid to distal RCA stenosis, distal left main to ostial LAD 90% stenosis, ostial circumflex 99% stenosis. -Will transfer patient to [facility] for CABG evaluation, accepting physician will be Dr.  Laurian Brim.  -Echo ordered however unlikely this will be done prior to transfer. -Continue aspirin 81 mg daily and Crestor 40 mg daily. -Continue heparin infusion as per pharmacy.   This patient's case was discussed and created with Dr. Melton Alar and she is in agreement.  Signed:  Gale Journey, PA-C  02/19/2023, 11:21 AM [facility] Cardiology

## 2023-02-19 NOTE — Discharge Summary (Addendum)
Physician Discharge Summary   Mary Hoover  female DOB: 03-03-1945  WUJ:[id number]  PCP: Dorcas Carrow, DO  Admit date: 02/18/2023 Discharge date: 02/19/2023  Admitted From: home Disposition:  transfer to [facility] for CABG CODE STATUS: Full code  Discharge Instructions     AMB referral to Phase II Cardiac Rehabilitation   Complete by: As directed    Diagnosis: NSTEMI   After initial evaluation and assessments completed: Virtual Based Care may be provided alone or in conjunction with Phase 2 Cardiac Rehab based on patient barriers.: Yes   Intensive Cardiac Rehabilitation (ICR) [facility] location only OR Traditional Cardiac Rehabilitation (TCR) *If criteria for ICR are not met will enroll in TCR [facility] only): Yes   Diet - low sodium heart healthy   Complete by: As directed       Hospital Course:  For full details, please see H&P, progress notes, consult notes and ancillary notes.  Briefly,  INDONESIA Hoover is a 78 y.o. female with medical history significant for NSCLC s/p radiation, cervical cancer s/p chemoradiation/brachytherapy completed 2021, COPD, CAD, PAD, carotid endarterectomy, HTN, hypothyroidism and tobacco use disorder, family history of premature CAD (brother died in 27s of MI) who presented to the ED with a 2 to 3-week history of intermittent exertional shortness of breath and chest heaviness radiating to right arm usually short-lived, relieved with rest.   * NSTEMI (non-ST elevated myocardial infarction) (HCC) Multivessel coronary artery disease  --Patient initially presented with chest pain and shortness of breath, had multiple episodes prior to coming to the ED.  Troponin checked and trended 93 > 163 > 465 > 611.  EKG demonstrated sinus rhythm with anterolateral ST depression.  Pt was started on heparin infusion in the ED and was given 325 mg of aspirin.  LHC revealed severe multivessel coronary artery disease with 100% mid to distal RCA stenosis, distal left main to  ostial LAD 90% stenosis, ostial circumflex 99% stenosis.  Pt needs CABG. --started on aspirin 81 mg daily and Crestor 40 mg daily.  --started on Lopressor 12.5 mg BID --cont heparin gtt --transfer to [facility] for CABG evaluation, accepting physician Dr. Laurian Brim.   Peripheral vascular disease (HCC) History of carotid endarterectomy --cardio started pt on ASA 81 and stopped home plavix. --started on Crestor 40 mg daily.    Essential hypertension Not currently on any meds --started on metoprolol 12.5 twice daily.      Tobacco use Nicotine patch and tobacco cessation counseling   COPD (chronic obstructive pulmonary disease) (HCC) Not acutely exacerbated Continue home inhalers   History of cervical cancer s/p chemoradiation/brachytherapy completed 2021   History of lung cancer Pulmonary nodule S/p radiation Lung nodule being surveilled by oncology-had chest CT on 02/16/2023 showed stable findings   Hypothyroidism Continue levothyroxine  CKD 2   Unless noted above, medications under "STOP" list are ones pt was not taking PTA.  Discharge Diagnoses:  Principal Problem:   NSTEMI (non-ST elevated myocardial infarction) [facility]) Active Problems:   Coronary artery disease   Essential hypertension   Peripheral vascular disease (HCC)   Tobacco use   COPD (chronic obstructive pulmonary disease) (HCC)   History of lung cancer   History of cervical cancer   History of carotid endarterectomy   Hypothyroidism   30 Day Unplanned Readmission Risk Score    Flowsheet Row ED to Hosp-Admission (Current) from 02/18/2023 in [facility] PCU  30 Day Unplanned Readmission Risk Score (%) 20.49 Filed at 02/19/2023 1600  This score is the patient's risk of an unplanned readmission within 30 days of being discharged (0 -100%). The score is based on dignosis, age, lab data, medications, orders, and past utilization.   Low:  0-14.9   Medium: 15-21.9   High:  22-29.9   Extreme: 30 and above         Discharge Instructions:  Allergies as of 02/19/2023       Reactions   Atorvastatin Other (See Comments)   dizziness        Medication List     PAUSE taking these medications    clopidogrel 75 MG tablet Wait to take this until your doctor or other care provider tells you to start again. Commonly known as: PLAVIX TAKE 1 TABLET EVERY DAY       STOP taking these medications    alendronate 70 MG tablet Commonly known as: FOSAMAX   lidocaine 5 % Commonly known as: Lidoderm   lidocaine-prilocaine cream Commonly known as: EMLA   prochlorperazine 10 MG tablet Commonly known as: COMPAZINE       TAKE these medications    acetaminophen 500 MG tablet Commonly known as: TYLENOL Take 500 mg by mouth every 6 (six) hours as needed for moderate pain.   aspirin 81 MG chewable tablet Chew 1 tablet (81 mg total) by mouth daily.   cyclobenzaprine 10 MG tablet Commonly known as: FLEXERIL Take 1 tablet (10 mg total) by mouth at bedtime.   gabapentin 100 MG capsule Commonly known as: NEURONTIN Take 1 capsule (100 mg total) by mouth 3 (three) times daily.   heparin 16109 UT/250ML infusion Inject 1,100 Units/hr into the vein continuous.   Iron-Vitamin C 65-125 MG Tabs Take 1 tablet by mouth daily.   levothyroxine 100 MCG tablet Commonly known as: SYNTHROID Take 1 tablet (100 mcg total) by mouth daily.   loperamide 2 MG capsule Commonly known as: IMODIUM Take 1 capsule (2 mg total) by mouth See admin instructions. Initial: 4 mg,the 2 mg after each loose bowel movements.  maximum: 16 mg/day   metoprolol tartrate 25 MG tablet Commonly known as: LOPRESSOR Take 0.5 tablets (12.5 mg total) by mouth 2 (two) times daily.   multivitamin tablet Take 1 tablet by mouth daily.   naproxen 500 MG tablet Commonly known as: Naprosyn Take 1 tablet (500 mg total) by mouth at bedtime as needed. Home med. What changed:  when to take  this reasons to take this additional instructions   nicotine 14 mg/24hr patch Commonly known as: NICODERM CQ - dosed in mg/24 hours Place 1 patch (14 mg total) onto the skin daily.   nitroGLYCERIN 0.4 MG SL tablet Commonly known as: NITROSTAT Place 1 tablet (0.4 mg total) under the tongue every 5 (five) minutes x 3 doses as needed for chest pain.   omeprazole 20 MG capsule Commonly known as: PRILOSEC Take 1 capsule (20 mg total) by mouth daily.   ondansetron 8 MG tablet Commonly known as: Zofran Take 1 tablet (8 mg total) by mouth every 8 (eight) hours as needed for refractory nausea / vomiting. Start on day 3 after chemo.   rosuvastatin 40 MG tablet Commonly known as: CRESTOR Take 1 tablet (40 mg total) by mouth daily. Start taking on: February 20, 2023   traMADol 50 MG tablet Commonly known as: ULTRAM Take 1 tablet (50 mg total) by mouth daily as needed.   Trelegy Ellipta 100-62.5-25 MCG/ACT Aepb Generic drug: Fluticasone-Umeclidin-Vilant Inhale 1 puff into the lungs daily.  vitamin B-12 500 MCG tablet Commonly known as: CYANOCOBALAMIN Take 500 mcg by mouth daily.         Follow-up Information     Custovic, Rozell Searing, DO. Go in 1 week(s).   Specialty: Cardiology Contact information: [address] [place] [address] [phone]                 Allergies  Allergen Reactions   Atorvastatin Other (See Comments)    dizziness     The results of significant diagnostics from this hospitalization (including imaging, microbiology, ancillary and laboratory) are listed below for reference.   Consultations:   Procedures/Studies: CARDIAC CATHETERIZATION Result Date: 02/19/2023   Mid RCA lesion is 100% stenosed.   Dist RCA lesion is 100% stenosed.   Dist LM to Ost LAD lesion is 90% stenosed.   Mid LAD lesion is 75% stenosed.   Ost Cx lesion is 99% stenosed.   The left ventricular systolic function is normal.   LV end diastolic pressure is normal.    The left ventricular ejection fraction is 50-55% by visual estimate.   Anticipated discharge date to be determined. Conclusion A patient diagnostic cardiac cath because of non-STEMI right radial approach Left ventriculogram showed preserved left ventricular function mild anterior apical hypokinesis ejection fraction 50 to 55% Coronaries Left main large short moderate calcification LAD large ostial 90% hazy moderate calcification proximal LAD diffuse 75 moderate calcification TIMI-3 flow Circumflex large 99% ostial TIMI-3 flow RCA moderate size 100% mid diffuse proximal to mid 50%.  Collaterals left to right Intervention deferred Patient tolerated procedure well No complications Patient with left main equivalent Referred patient to tertiary care center for consideration for coronary bypass surgery   DG Chest 2 View Result Date: 02/17/2023 CLINICAL DATA:  Shortness of breath, chest pressure EXAM: CHEST - 2 VIEW COMPARISON:  02/01/2023 FINDINGS: Left Port-A-Cath remains in place, unchanged. There is hyperinflation of the lungs compatible with COPD. Right upper lobe scarring is stable. No acute confluent opacities or effusions. Heart mediastinal contours within normal limits. Aortic atherosclerosis. No acute bony abnormality. IMPRESSION: COPD/chronic changes. No active disease. Electronically Signed   By: Charlett Nose M.D.   On: 02/17/2023 21:12   CT Chest W Contrast Result Date: 02/16/2023 CLINICAL DATA:  Lung cancer follow-up. * Tracking Code: BO * EXAM: CT CHEST WITH CONTRAST TECHNIQUE: Multidetector CT imaging of the chest was performed during intravenous contrast administration. RADIATION DOSE REDUCTION: This exam was performed according to the departmental dose-optimization program which includes automated exposure control, adjustment of the mA and/or kV according to patient size and/or use of iterative reconstruction technique. CONTRAST:  75mL OMNIPAQUE IOHEXOL 300 MG/ML  SOLN COMPARISON:  11/09/2022  FINDINGS: Cardiovascular: Heart size is normal. Aortic atherosclerosis. Coronary artery atherosclerotic calcifications. No pericardial effusion. Mediastinum/Nodes: Thyroid gland, cervix, and esophagus are unremarkable. Previously referenced subcarinal node measures 0.8 cm, image 75/2. Previously 0.9 cm. Right hilar node measures 0.9 cm, image 63/2. Previously 1 cm. Lungs/Pleura: Moderate to severe changes of emphysema. Fibrosis and architectural distortion within the right upper lobe is again identified and appears similar to the previous exam. There are no specific findings within this area to suggest locally recurrent tumor. Solid nodule within the posterior left lower lobe measures 5 mm, image 102/4. Unchanged from previous exam. Calcified granuloma noted in posterior right base. No new or enlarging lung nodules. Upper Abdomen: No acute abnormality. Calcified granuloma within the medial segment of left hepatic lobe. Normal adrenal glands. Cyst off the posterior cortex of  the right kidney is unchanged measuring 1 cm, image 177/2. Musculoskeletal: Degenerative changes are identified within the thoracic spine. No acute or suspicious osseous findings. IMPRESSION: 1. Stable post treatment changes within the right upper lobe. No specific findings to suggest locally recurrent tumor. 2. Stable 5 mm solid nodule within the posterior left lower lobe. 3. Coronary artery calcifications. 4. Aortic Atherosclerosis (ICD10-I70.0) and Emphysema (ICD10-J43.9). Electronically Signed   By: Signa Kell M.D.   On: 02/16/2023 13:25   DG Chest 2 View Result Date: 02/01/2023 CLINICAL DATA:  Short of breath, hypoxia EXAM: CHEST - 2 VIEW COMPARISON:  03/29/2021 FINDINGS: Frontal and lateral views of the chest demonstrate stable left chest wall port tip within the superior vena cava. Stable cardiac silhouette. Stable emphysema. Stable linear densities within the right upper lobe, correlating to scarring identified on prior chest CT. No  acute airspace disease, effusion, or pneumothorax. No acute bony abnormalities. IMPRESSION: 1. Emphysema, with stable right upper lobe scarring. No acute intrathoracic process. Electronically Signed   By: Sharlet Salina M.D.   On: 02/01/2023 19:48      Labs: BNP (last 3 results) No results for input(s): "BNP" in the last 8760 hours. Basic Metabolic Panel: Recent Labs  Lab 02/16/23 0837 02/17/23 2228 02/19/23 0232  NA 133* 136 131*  K 3.9 4.5 4.0  CL 102 101 97*  CO2 24 24 23   GLUCOSE 100* 117* 94  BUN 14 19 14   CREATININE 0.79 0.96 0.87  CALCIUM 8.8* 9.9 8.8*  MG  --   --  1.9   Liver Function Tests: Recent Labs  Lab 02/16/23 0837  AST 20  ALT 18  ALKPHOS 71  BILITOT 0.5  PROT 6.7  ALBUMIN 3.8   No results for input(s): "LIPASE", "AMYLASE" in the last 168 hours. No results for input(s): "AMMONIA" in the last 168 hours. CBC: Recent Labs  Lab 02/16/23 0837 02/17/23 2228 02/19/23 0232  WBC 5.4 7.8 5.6  NEUTROABS 3.9  --   --   HGB 11.2* 12.1 11.3*  HCT 32.7* 35.1* 32.1*  MCV 95.9 95.1 96.4  PLT 197 223 173   Cardiac Enzymes: No results for input(s): "CKTOTAL", "CKMB", "CKMBINDEX", "TROPONINI" in the last 168 hours. BNP: Invalid input(s): "POCBNP" CBG: No results for input(s): "GLUCAP" in the last 168 hours. D-Dimer Recent Labs    02/18/23 0045  DDIMER 0.89*   Hgb A1c No results for input(s): "HGBA1C" in the last 72 hours. Lipid Profile No results for input(s): "CHOL", "HDL", "LDLCALC", "TRIG", "CHOLHDL", "LDLDIRECT" in the last 72 hours. Thyroid function studies No results for input(s): "TSH", "T4TOTAL", "T3FREE", "THYROIDAB" in the last 72 hours.  Invalid input(s): "FREET3" Anemia work up No results for input(s): "VITAMINB12", "FOLATE", "FERRITIN", "TIBC", "IRON", "RETICCTPCT" in the last 72 hours. Urinalysis    Component Value Date/Time   COLORURINE YELLOW (A) 02/12/2022 1900   APPEARANCEUR Clear 04/03/2022 1322   LABSPEC >1.046 (H)  02/12/2022 1900   PHURINE 5.0 02/12/2022 1900   GLUCOSEU Negative 04/03/2022 1322   HGBUR SMALL (A) 02/12/2022 1900   BILIRUBINUR Negative 04/03/2022 1322   KETONESUR NEGATIVE 02/12/2022 1900   PROTEINUR Negative 04/03/2022 1322   PROTEINUR NEGATIVE 02/12/2022 1900   NITRITE Negative 04/03/2022 1322   NITRITE NEGATIVE 02/12/2022 1900   LEUKOCYTESUR 1+ (A) 04/03/2022 1322   LEUKOCYTESUR TRACE (A) 02/12/2022 1900   Sepsis Labs Recent Labs  Lab 02/16/23 0837 02/17/23 2228 02/19/23 0232  WBC 5.4 7.8 5.6   Microbiology No results found for this  or any previous visit (from the past 240 hours).   Total time spend on discharging this patient, including the last patient exam, discussing the hospital stay, instructions for ongoing care as it relates to all pertinent caregivers, as well as preparing the medical discharge records, prescriptions, and/or referrals as applicable, is 45 minutes.    Darlin Priestly, MD  [facility] 02/19/2023, 8:02 PM

## 2023-02-19 NOTE — Progress Notes (Signed)
PHARMACY - ANTICOAGULATION CONSULT NOTE  Pharmacy Consult for Heparin  Indication: chest pain/ACS  Allergies  Allergen Reactions   Atorvastatin Other (See Comments)    dizziness    Patient Measurements: Weight: 60.8 kg (134 lb) Heparin Dosing Weight: 60.8 kg   Vital Signs: Temp: 98.2 F (36.8 C) (01/19 2312) Temp Source: Oral (01/19 2312) BP: 135/68 (01/20 0300) Pulse Rate: 70 (01/20 0300)  Labs: Recent Labs    02/16/23 0837 02/17/23 2228 02/17/23 2228 02/18/23 0019 02/18/23 0813 02/18/23 1658 02/19/23 0232  HGB 11.2* 12.1  --   --   --   --  11.3*  HCT 32.7* 35.1*  --   --   --   --  32.1*  PLT 197 223  --   --   --   --  173  HEPARINUNFRC  --   --   --   --  0.26* 0.25* 0.39  CREATININE 0.79 0.96  --   --   --   --  0.87  TROPONINIHS  --  93*   < > 163* 465* 611*  --    < > = values in this interval not displayed.    Estimated Creatinine Clearance: 53.6 mL/min (by C-G formula based on SCr of 0.87 mg/dL).   Medical History: Past Medical History:  Diagnosis Date   Arthritis    Cancer (HCC)    Carotid atherosclerosis    Carotid bruit    Cervical cancer (HCC)    COPD (chronic obstructive pulmonary disease) (HCC)    emphysema   Coronary artery disease    Family history of adverse reaction to anesthesia    paternal grandfather died during surgery-pt unaware what happened   GERD (gastroesophageal reflux disease)    Hyperlipidemia    Hypertension    Hyponatremia 02/27/2019   Hypothyroidism    Infected cat bite 1980s   was hospitalized   Irregular heartbeat    Medical history non-contributory    Peripheral vascular disease (HCC)    Renal insufficiency    Tobacco use     Medications:  (Not in a hospital admission)   Assessment: Pharmacy consulted to dose heparin in this 78 year old female admitted with ACS/NSTEMI.  No prior anticoag noted.  CrCl = 48.6 ml/min   1/19 0813  HL 0.26 Subtherapeutic 1/19 1658  HL 0.25  Subtherapeutic 1/20 0232  HL  0.39     Therapeutic X 1   No issues with infusion reported, no signs/symptoms of bleeding noted.  Goal of Therapy:  Heparin level 0.3-0.7 units/ml Monitor platelets by anticoagulation protocol: Yes   Plan:  1/20:  HL @ 0232 = 0.39, therapeutic X 1 - Will continue pt on current rate and recheck HL in 8 hrs.  Monitor CBC and signs/symptoms of bleeding  Thank you for involving pharmacy in this patient's care.   Ladasha Schnackenberg D Clinical Pharmacist 02/19/2023 3:48 AM

## 2023-02-19 NOTE — Progress Notes (Signed)
PHARMACY - ANTICOAGULATION CONSULT NOTE  Pharmacy Consult for Heparin  Indication: chest pain/ACS   -now planning to go for CABG at [facility]  Allergies  Allergen Reactions   Atorvastatin Other (See Comments)    dizziness    Patient Measurements: Height: 5\' 8"  (172.7 cm) Weight: 60.8 kg (134 lb 0.6 oz) IBW/kg (Calculated) : 63.9 Heparin Dosing Weight: 60.8 kg   Vital Signs: Temp: 98.3 F (36.8 C) (01/20 1630) Temp Source: Oral (01/20 1141) BP: 100/62 (01/20 1630) Pulse Rate: 100 (01/20 1630)  Labs: Recent Labs    02/17/23 2228 02/18/23 0019 02/18/23 0813 02/18/23 0813 02/18/23 1658 02/19/23 0232 02/19/23 1918  HGB 12.1  --   --   --   --  11.3*  --   HCT 35.1*  --   --   --   --  32.1*  --   PLT 223  --   --   --   --  173  --   HEPARINUNFRC  --   --  0.26*   < > 0.25* 0.39 0.10*  CREATININE 0.96  --   --   --   --  0.87  --   TROPONINIHS 93* 163* 465*  --  611*  --   --    < > = values in this interval not displayed.    Estimated Creatinine Clearance: 53.6 mL/min (by C-G formula based on SCr of 0.87 mg/dL).   Medical History: Past Medical History:  Diagnosis Date   Arthritis    Cancer (HCC)    Carotid atherosclerosis    Carotid bruit    Cervical cancer (HCC)    COPD (chronic obstructive pulmonary disease) (HCC)    emphysema   Coronary artery disease    Family history of adverse reaction to anesthesia    paternal grandfather died during surgery-pt unaware what happened   GERD (gastroesophageal reflux disease)    Hyperlipidemia    Hypertension    Hyponatremia 02/27/2019   Hypothyroidism    Infected cat bite 1980s   was hospitalized   Irregular heartbeat    Medical history non-contributory    Peripheral vascular disease (HCC)    Renal insufficiency    Tobacco use     Medications:  Medications Prior to Admission  Medication Sig Dispense Refill Last Dose/Taking   acetaminophen (TYLENOL) 500 MG tablet Take 500 mg by mouth every 6 (six) hours as needed  for moderate pain.   Taking As Needed   cyclobenzaprine (FLEXERIL) 10 MG tablet Take 1 tablet (10 mg total) by mouth at bedtime. 90 tablet 3 02/16/2023   Fluticasone-Umeclidin-Vilant (TRELEGY ELLIPTA) 100-62.5-25 MCG/ACT AEPB Inhale 1 puff into the lungs daily. 1 each 11 02/17/2023   gabapentin (NEURONTIN) 100 MG capsule Take 1 capsule (100 mg total) by mouth 3 (three) times daily. 270 capsule 1 02/17/2023   Iron-Vitamin C 65-125 MG TABS Take 1 tablet by mouth daily. 30 tablet 5 02/17/2023   levothyroxine (SYNTHROID) 100 MCG tablet Take 1 tablet (100 mcg total) by mouth daily. 30 tablet 1 02/17/2023   loperamide (IMODIUM) 2 MG capsule Take 1 capsule (2 mg total) by mouth See admin instructions. Initial: 4 mg,the 2 mg after each loose bowel movements.  maximum: 16 mg/day 60 capsule 0 Taking   Multiple Vitamin (MULTIVITAMIN) tablet Take 1 tablet by mouth daily.   02/17/2023   naproxen (NAPROSYN) 500 MG tablet Take 1 tablet (500 mg total) by mouth at bedtime. 30 tablet 3 02/16/2023   omeprazole (PRILOSEC) 20  MG capsule Take 1 capsule (20 mg total) by mouth daily. 90 capsule 3 02/17/2023   ondansetron (ZOFRAN) 8 MG tablet Take 1 tablet (8 mg total) by mouth every 8 (eight) hours as needed for refractory nausea / vomiting. Start on day 3 after chemo. 30 tablet 1 Taking As Needed   traMADol (ULTRAM) 50 MG tablet Take 1 tablet (50 mg total) by mouth daily as needed. 60 tablet 0 Taking As Needed   vitamin B-12 (CYANOCOBALAMIN) 500 MCG tablet Take 500 mcg by mouth daily.   02/17/2023   alendronate (FOSAMAX) 70 MG tablet TAKE 1 TABLET EVERY 7 DAYS . TAKE WITH A FULL GLASS OF WATER ON AN EMPTY STOMACH. (Patient not taking: Reported on 02/18/2023) 12 tablet 3 Not Taking   lidocaine (LIDODERM) 5 % Place 1 patch onto the skin daily. Remove & Discard patch within 12 hours or as directed by MD (Patient not taking: Reported on 02/18/2023) 90 patch 1 Not Taking   lidocaine-prilocaine (EMLA) cream Apply 1 application topically as  needed. (Patient not taking: Reported on 02/18/2023) 60 g 3 Not Taking   prochlorperazine (COMPAZINE) 10 MG tablet Take 1 tablet (10 mg total) by mouth every 6 (six) hours as needed for nausea or vomiting. (Patient not taking: Reported on 02/18/2023) 30 tablet 0 Not Taking    Assessment: Pharmacy consulted to dose heparin in this 78 year old female admitted with ACS/NSTEMI.  No prior anticoag noted. S/p cath, pt will need CABG eval. Trop peaked in the 600s.  -s/p cardiac cath 1/20:  Will transfer patient to [facility] for CABG evaluation   1/19 0813  HL 0.26 Subtherapeutic 1/19 1658  HL 0.25  Subtherapeutic 1/20 0232  HL 0.39     Therapeutic X 1  1/20 1918  HL 0.10 subtherapeutic  No issues with infusion reported, no signs/symptoms of bleeding noted.  Goal of Therapy:  Heparin level 0.3-0.7 units/ml Monitor platelets by anticoagulation protocol: Yes   Plan:  1/20 1918  HL 0.10 Subtherapeutic Will order heparin bolus of 1800 units and increase heparin drip from 950 units/hr to 1100 units/hr Check HL 8 hrs after rate change  CBC daily while on heparin.   Thank you for involving pharmacy in this patient's care.   Meyer Dockery A Clinical Pharmacist 02/19/2023 7:46 PM

## 2023-02-19 NOTE — CV Procedure (Signed)
Brief post cath note Indication non-STEMI unstable angina Right radial approach inpatient  Left ventriculogram Normal left ventricular function EF around 50 to 55%  Coronaries Severe calcification proximal to mid Left main very short LAD large ostial 99 hazy TIMI-3 flow Ostial large circumflex 90 hazy TIMI-3 flow RCA medium 100% mid Faint collaterals left to distal right  Intervention deferred Recommend referral for coronary bypass surgery Resume heparin 2 hours after sheath removal At the patient follow-up with cardiology 1 to 2 weeks after discharge from CABG Recommend inpatient stay until transferred for coronary bypass evaluation Patient hemodynamically stable pain-free end of case Tolerated procedure well no complications

## 2023-02-19 NOTE — Progress Notes (Signed)
Report given to [facility]. Calling transport now.

## 2023-02-19 NOTE — Progress Notes (Signed)
Called [facility] to give report to Macarthur Critchley for patient transfer. Shon Hough RN requested that I call her back in 30 minutes.

## 2023-02-19 NOTE — Telephone Encounter (Signed)
Requested Prescriptions  Pending Prescriptions Disp Refills   clopidogrel (PLAVIX) 75 MG tablet [Pharmacy Med Name: Clopidogrel Bisulfate Oral Tablet 75 MG] 90 tablet 0    Sig: TAKE 1 TABLET EVERY DAY     Hematology: Antiplatelets - clopidogrel Failed - 02/19/2023 11:26 AM      Failed - HCT in normal range and within 180 days    HCT  Date Value Ref Range Status  02/19/2023 32.1 (L) 36.0 - 46.0 % Final   Hematocrit  Date Value Ref Range Status  02/01/2023 38.7 34.0 - 46.6 % Final         Failed - HGB in normal range and within 180 days    Hemoglobin  Date Value Ref Range Status  02/19/2023 11.3 (L) 12.0 - 15.0 g/dL Final  38/75/6433 29.5 (L) 12.0 - 15.0 g/dL Final  18/84/1660 63.0 11.1 - 15.9 g/dL Final         Passed - PLT in normal range and within 180 days    Platelets  Date Value Ref Range Status  02/19/2023 173 150 - 400 K/uL Final  02/01/2023 294 150 - 450 x10E3/uL Final   Platelet Count  Date Value Ref Range Status  02/16/2023 197 150 - 400 K/uL Final         Passed - Cr in normal range and within 360 days    Creatinine  Date Value Ref Range Status  02/16/2023 0.79 0.44 - 1.00 mg/dL Final  16/01/930 3.55 mg/dL Final    Comment:    7.32-2.02 NOTE: New Reference Range  04/07/14    Creatinine, Ser  Date Value Ref Range Status  02/19/2023 0.87 0.44 - 1.00 mg/dL Final         Passed - Valid encounter within last 6 months    Recent Outpatient Visits           2 weeks ago Chronic obstructive pulmonary disease, unspecified COPD type (HCC)   [facility] [place], Megan P, DO   2 weeks ago Hypoxia   [place] [place], Megan P, DO   4 months ago Anemia in chronic kidney disease (CODE)   [place] [facility] [place], Megan P, DO   10 months ago Right foot pain   [place] [facility] [place], Megan P, DO   10 months ago Encounter for [employer] annual wellness exam   [place]  [facility] Jessie, Oralia Rud, DO       Future Appointments             In 1 month Dorcas Carrow, DO [place] [facility], [facility]

## 2023-02-19 NOTE — Progress Notes (Signed)
PHARMACY - ANTICOAGULATION CONSULT NOTE  Pharmacy Consult for Heparin  Indication: chest pain/ACS  Allergies  Allergen Reactions   Atorvastatin Other (See Comments)    dizziness    Patient Measurements: Height: 5\' 8"  (172.7 cm) Weight: 60.8 kg (134 lb 0.6 oz) IBW/kg (Calculated) : 63.9 Heparin Dosing Weight: 60.8 kg   Vital Signs: Temp: 97.8 F (36.6 C) (01/20 0713) Temp Source: Oral (01/20 0616) BP: 140/68 (01/20 1045) Pulse Rate: 75 (01/20 1045)  Labs: Recent Labs    02/17/23 2228 02/18/23 0019 02/18/23 0813 02/18/23 1658 02/19/23 0232  HGB 12.1  --   --   --  11.3*  HCT 35.1*  --   --   --  32.1*  PLT 223  --   --   --  173  HEPARINUNFRC  --   --  0.26* 0.25* 0.39  CREATININE 0.96  --   --   --  0.87  TROPONINIHS 93* 163* 465* 611*  --     Estimated Creatinine Clearance: 53.6 mL/min (by C-G formula based on SCr of 0.87 mg/dL).   Medical History: Past Medical History:  Diagnosis Date   Arthritis    Cancer (HCC)    Carotid atherosclerosis    Carotid bruit    Cervical cancer (HCC)    COPD (chronic obstructive pulmonary disease) (HCC)    emphysema   Coronary artery disease    Family history of adverse reaction to anesthesia    paternal grandfather died during surgery-pt unaware what happened   GERD (gastroesophageal reflux disease)    Hyperlipidemia    Hypertension    Hyponatremia 02/27/2019   Hypothyroidism    Infected cat bite 1980s   was hospitalized   Irregular heartbeat    Medical history non-contributory    Peripheral vascular disease (HCC)    Renal insufficiency    Tobacco use     Medications:  Medications Prior to Admission  Medication Sig Dispense Refill Last Dose/Taking   acetaminophen (TYLENOL) 500 MG tablet Take 500 mg by mouth every 6 (six) hours as needed for moderate pain.   Taking As Needed   clopidogrel (PLAVIX) 75 MG tablet Take 1 tablet (75 mg total) by mouth daily. 90 tablet 1 02/17/2023   cyclobenzaprine (FLEXERIL) 10 MG  tablet Take 1 tablet (10 mg total) by mouth at bedtime. 90 tablet 3 02/16/2023   Fluticasone-Umeclidin-Vilant (TRELEGY ELLIPTA) 100-62.5-25 MCG/ACT AEPB Inhale 1 puff into the lungs daily. 1 each 11 02/17/2023   gabapentin (NEURONTIN) 100 MG capsule Take 1 capsule (100 mg total) by mouth 3 (three) times daily. 270 capsule 1 02/17/2023   Iron-Vitamin C 65-125 MG TABS Take 1 tablet by mouth daily. 30 tablet 5 02/17/2023   levothyroxine (SYNTHROID) 100 MCG tablet Take 1 tablet (100 mcg total) by mouth daily. 30 tablet 1 02/17/2023   loperamide (IMODIUM) 2 MG capsule Take 1 capsule (2 mg total) by mouth See admin instructions. Initial: 4 mg,the 2 mg after each loose bowel movements.  maximum: 16 mg/day 60 capsule 0 Taking   Multiple Vitamin (MULTIVITAMIN) tablet Take 1 tablet by mouth daily.   02/17/2023   naproxen (NAPROSYN) 500 MG tablet Take 1 tablet (500 mg total) by mouth at bedtime. 30 tablet 3 02/16/2023   omeprazole (PRILOSEC) 20 MG capsule Take 1 capsule (20 mg total) by mouth daily. 90 capsule 3 02/17/2023   ondansetron (ZOFRAN) 8 MG tablet Take 1 tablet (8 mg total) by mouth every 8 (eight) hours as needed for refractory nausea /  vomiting. Start on day 3 after chemo. 30 tablet 1 Taking As Needed   traMADol (ULTRAM) 50 MG tablet Take 1 tablet (50 mg total) by mouth daily as needed. 60 tablet 0 Taking As Needed   vitamin B-12 (CYANOCOBALAMIN) 500 MCG tablet Take 500 mcg by mouth daily.   02/17/2023   alendronate (FOSAMAX) 70 MG tablet TAKE 1 TABLET EVERY 7 DAYS . TAKE WITH A FULL GLASS OF WATER ON AN EMPTY STOMACH. (Patient not taking: Reported on 02/18/2023) 12 tablet 3 Not Taking   lidocaine (LIDODERM) 5 % Place 1 patch onto the skin daily. Remove & Discard patch within 12 hours or as directed by MD (Patient not taking: Reported on 02/18/2023) 90 patch 1 Not Taking   lidocaine-prilocaine (EMLA) cream Apply 1 application topically as needed. (Patient not taking: Reported on 02/18/2023) 60 g 3 Not Taking    prochlorperazine (COMPAZINE) 10 MG tablet Take 1 tablet (10 mg total) by mouth every 6 (six) hours as needed for nausea or vomiting. (Patient not taking: Reported on 02/18/2023) 30 tablet 0 Not Taking    Assessment: Pharmacy consulted to dose heparin in this 78 year old female admitted with ACS/NSTEMI.  No prior anticoag noted. S/p cath, pt will need CABG eval. Trop peaked in the 600s.   1/19 0813  HL 0.26 Subtherapeutic 1/19 1658  HL 0.25  Subtherapeutic 1/20 0232  HL 0.39     Therapeutic X 1   No issues with infusion reported, no signs/symptoms of bleeding noted.  Goal of Therapy:  Heparin level 0.3-0.7 units/ml Monitor platelets by anticoagulation protocol: Yes   Plan:  Heparin restarted @ 11:00. Plan to order heparin level in 8 hours. CBC daily while on heparin.   Thank you for involving pharmacy in this patient's care.   Ronnald Ramp Clinical Pharmacist 02/19/2023 11:07 AM

## 2023-02-20 ENCOUNTER — Encounter: Payer: Self-pay | Admitting: Internal Medicine

## 2023-02-20 DIAGNOSIS — I6501 Occlusion and stenosis of right vertebral artery: Secondary | ICD-10-CM | POA: Diagnosis not present

## 2023-02-20 DIAGNOSIS — I249 Acute ischemic heart disease, unspecified: Secondary | ICD-10-CM | POA: Diagnosis not present

## 2023-02-20 DIAGNOSIS — Z01818 Encounter for other preprocedural examination: Secondary | ICD-10-CM | POA: Diagnosis not present

## 2023-02-20 DIAGNOSIS — I743 Embolism and thrombosis of arteries of the lower extremities: Secondary | ICD-10-CM | POA: Diagnosis not present

## 2023-02-20 DIAGNOSIS — I959 Hypotension, unspecified: Secondary | ICD-10-CM | POA: Diagnosis not present

## 2023-02-20 DIAGNOSIS — I70201 Unspecified atherosclerosis of native arteries of extremities, right leg: Secondary | ICD-10-CM | POA: Diagnosis not present

## 2023-02-20 DIAGNOSIS — R579 Shock, unspecified: Secondary | ICD-10-CM | POA: Diagnosis not present

## 2023-02-20 DIAGNOSIS — I25118 Atherosclerotic heart disease of native coronary artery with other forms of angina pectoris: Secondary | ICD-10-CM | POA: Diagnosis not present

## 2023-02-20 DIAGNOSIS — I771 Stricture of artery: Secondary | ICD-10-CM | POA: Diagnosis not present

## 2023-02-20 DIAGNOSIS — E039 Hypothyroidism, unspecified: Secondary | ICD-10-CM | POA: Diagnosis not present

## 2023-02-20 DIAGNOSIS — J439 Emphysema, unspecified: Secondary | ICD-10-CM | POA: Diagnosis not present

## 2023-02-20 DIAGNOSIS — Z452 Encounter for adjustment and management of vascular access device: Secondary | ICD-10-CM | POA: Diagnosis not present

## 2023-02-20 DIAGNOSIS — I10 Essential (primary) hypertension: Secondary | ICD-10-CM | POA: Diagnosis not present

## 2023-02-20 DIAGNOSIS — I739 Peripheral vascular disease, unspecified: Secondary | ICD-10-CM | POA: Diagnosis not present

## 2023-02-20 DIAGNOSIS — Z515 Encounter for palliative care: Secondary | ICD-10-CM | POA: Diagnosis not present

## 2023-02-20 DIAGNOSIS — I6523 Occlusion and stenosis of bilateral carotid arteries: Secondary | ICD-10-CM | POA: Diagnosis not present

## 2023-02-20 DIAGNOSIS — K219 Gastro-esophageal reflux disease without esophagitis: Secondary | ICD-10-CM | POA: Diagnosis not present

## 2023-02-20 DIAGNOSIS — Z923 Personal history of irradiation: Secondary | ICD-10-CM | POA: Diagnosis not present

## 2023-02-20 DIAGNOSIS — R570 Cardiogenic shock: Secondary | ICD-10-CM | POA: Diagnosis not present

## 2023-02-20 DIAGNOSIS — F17210 Nicotine dependence, cigarettes, uncomplicated: Secondary | ICD-10-CM | POA: Diagnosis not present

## 2023-02-20 DIAGNOSIS — R001 Bradycardia, unspecified: Secondary | ICD-10-CM | POA: Diagnosis not present

## 2023-02-20 DIAGNOSIS — I82811 Embolism and thrombosis of superficial veins of right lower extremities: Secondary | ICD-10-CM | POA: Diagnosis not present

## 2023-02-20 DIAGNOSIS — I82622 Acute embolism and thrombosis of deep veins of left upper extremity: Secondary | ICD-10-CM | POA: Diagnosis not present

## 2023-02-20 DIAGNOSIS — E785 Hyperlipidemia, unspecified: Secondary | ICD-10-CM | POA: Diagnosis not present

## 2023-02-20 DIAGNOSIS — J9602 Acute respiratory failure with hypercapnia: Secondary | ICD-10-CM | POA: Diagnosis not present

## 2023-02-20 DIAGNOSIS — J9690 Respiratory failure, unspecified, unspecified whether with hypoxia or hypercapnia: Secondary | ICD-10-CM | POA: Diagnosis not present

## 2023-02-20 DIAGNOSIS — I7777 Dissection of artery of lower extremity: Secondary | ICD-10-CM | POA: Diagnosis not present

## 2023-02-20 DIAGNOSIS — G9340 Encephalopathy, unspecified: Secondary | ICD-10-CM | POA: Diagnosis not present

## 2023-02-20 DIAGNOSIS — I998 Other disorder of circulatory system: Secondary | ICD-10-CM | POA: Diagnosis not present

## 2023-02-20 DIAGNOSIS — R0689 Other abnormalities of breathing: Secondary | ICD-10-CM | POA: Diagnosis not present

## 2023-02-20 DIAGNOSIS — R918 Other nonspecific abnormal finding of lung field: Secondary | ICD-10-CM | POA: Diagnosis not present

## 2023-02-20 DIAGNOSIS — I214 Non-ST elevation (NSTEMI) myocardial infarction: Secondary | ICD-10-CM | POA: Diagnosis not present

## 2023-02-20 DIAGNOSIS — J811 Chronic pulmonary edema: Secondary | ICD-10-CM | POA: Diagnosis not present

## 2023-02-20 DIAGNOSIS — F17200 Nicotine dependence, unspecified, uncomplicated: Secondary | ICD-10-CM | POA: Diagnosis not present

## 2023-02-20 DIAGNOSIS — J9601 Acute respiratory failure with hypoxia: Secondary | ICD-10-CM | POA: Diagnosis not present

## 2023-02-20 DIAGNOSIS — I6522 Occlusion and stenosis of left carotid artery: Secondary | ICD-10-CM | POA: Diagnosis not present

## 2023-02-20 DIAGNOSIS — I708 Atherosclerosis of other arteries: Secondary | ICD-10-CM | POA: Diagnosis not present

## 2023-02-20 DIAGNOSIS — Z4659 Encounter for fitting and adjustment of other gastrointestinal appliance and device: Secondary | ICD-10-CM | POA: Diagnosis not present

## 2023-02-20 DIAGNOSIS — Z8709 Personal history of other diseases of the respiratory system: Secondary | ICD-10-CM | POA: Diagnosis not present

## 2023-02-20 DIAGNOSIS — Z85118 Personal history of other malignant neoplasm of bronchus and lung: Secondary | ICD-10-CM | POA: Diagnosis not present

## 2023-02-20 DIAGNOSIS — Z4682 Encounter for fitting and adjustment of non-vascular catheter: Secondary | ICD-10-CM | POA: Diagnosis not present

## 2023-02-20 DIAGNOSIS — R569 Unspecified convulsions: Secondary | ICD-10-CM | POA: Diagnosis not present

## 2023-02-20 DIAGNOSIS — F17290 Nicotine dependence, other tobacco product, uncomplicated: Secondary | ICD-10-CM | POA: Diagnosis not present

## 2023-02-20 DIAGNOSIS — R9431 Abnormal electrocardiogram [ECG] [EKG]: Secondary | ICD-10-CM | POA: Diagnosis not present

## 2023-02-20 DIAGNOSIS — I700 Atherosclerosis of aorta: Secondary | ICD-10-CM | POA: Diagnosis not present

## 2023-02-20 DIAGNOSIS — J449 Chronic obstructive pulmonary disease, unspecified: Secondary | ICD-10-CM | POA: Diagnosis not present

## 2023-02-20 DIAGNOSIS — I742 Embolism and thrombosis of arteries of the upper extremities: Secondary | ICD-10-CM | POA: Diagnosis not present

## 2023-02-20 DIAGNOSIS — I2510 Atherosclerotic heart disease of native coronary artery without angina pectoris: Secondary | ICD-10-CM | POA: Diagnosis not present

## 2023-02-20 DIAGNOSIS — J90 Pleural effusion, not elsewhere classified: Secondary | ICD-10-CM | POA: Diagnosis not present

## 2023-02-20 DIAGNOSIS — Z66 Do not resuscitate: Secondary | ICD-10-CM | POA: Diagnosis not present

## 2023-02-20 LAB — ECHOCARDIOGRAM COMPLETE
Area-P 1/2: 3.99 cm2
Height: 68 in
S' Lateral: 2.3 cm
Weight: 2144.63 [oz_av]

## 2023-02-20 LAB — LIPOPROTEIN A (LPA): Lipoprotein (a): 27 nmol/L (ref <75.0)

## 2023-02-20 NOTE — Progress Notes (Signed)
Patient has been picked up by [employer] per protocol. VSS, Heparin gtt continues.

## 2023-02-21 DIAGNOSIS — R9431 Abnormal electrocardiogram [ECG] [EKG]: Secondary | ICD-10-CM | POA: Diagnosis not present

## 2023-02-21 DIAGNOSIS — Z85118 Personal history of other malignant neoplasm of bronchus and lung: Secondary | ICD-10-CM | POA: Diagnosis not present

## 2023-02-21 DIAGNOSIS — Z01818 Encounter for other preprocedural examination: Secondary | ICD-10-CM | POA: Diagnosis not present

## 2023-02-21 DIAGNOSIS — Z923 Personal history of irradiation: Secondary | ICD-10-CM | POA: Diagnosis not present

## 2023-02-21 DIAGNOSIS — I25118 Atherosclerotic heart disease of native coronary artery with other forms of angina pectoris: Secondary | ICD-10-CM | POA: Diagnosis not present

## 2023-02-21 DIAGNOSIS — I6501 Occlusion and stenosis of right vertebral artery: Secondary | ICD-10-CM | POA: Diagnosis not present

## 2023-02-21 DIAGNOSIS — I700 Atherosclerosis of aorta: Secondary | ICD-10-CM | POA: Diagnosis not present

## 2023-02-21 DIAGNOSIS — I708 Atherosclerosis of other arteries: Secondary | ICD-10-CM | POA: Diagnosis not present

## 2023-02-22 DIAGNOSIS — R570 Cardiogenic shock: Secondary | ICD-10-CM | POA: Diagnosis not present

## 2023-02-22 DIAGNOSIS — E785 Hyperlipidemia, unspecified: Secondary | ICD-10-CM | POA: Diagnosis not present

## 2023-02-22 DIAGNOSIS — I2510 Atherosclerotic heart disease of native coronary artery without angina pectoris: Secondary | ICD-10-CM | POA: Diagnosis not present

## 2023-02-22 DIAGNOSIS — J449 Chronic obstructive pulmonary disease, unspecified: Secondary | ICD-10-CM | POA: Diagnosis not present

## 2023-02-22 DIAGNOSIS — I959 Hypotension, unspecified: Secondary | ICD-10-CM | POA: Diagnosis not present

## 2023-02-22 DIAGNOSIS — R9431 Abnormal electrocardiogram [ECG] [EKG]: Secondary | ICD-10-CM | POA: Diagnosis not present

## 2023-02-22 DIAGNOSIS — K219 Gastro-esophageal reflux disease without esophagitis: Secondary | ICD-10-CM | POA: Diagnosis not present

## 2023-02-22 DIAGNOSIS — E039 Hypothyroidism, unspecified: Secondary | ICD-10-CM | POA: Diagnosis not present

## 2023-02-22 DIAGNOSIS — F17210 Nicotine dependence, cigarettes, uncomplicated: Secondary | ICD-10-CM | POA: Diagnosis not present

## 2023-02-22 DIAGNOSIS — I214 Non-ST elevation (NSTEMI) myocardial infarction: Secondary | ICD-10-CM | POA: Diagnosis not present

## 2023-02-22 DIAGNOSIS — I249 Acute ischemic heart disease, unspecified: Secondary | ICD-10-CM | POA: Diagnosis not present

## 2023-02-22 DIAGNOSIS — I10 Essential (primary) hypertension: Secondary | ICD-10-CM | POA: Diagnosis not present

## 2023-02-22 DIAGNOSIS — I25118 Atherosclerotic heart disease of native coronary artery with other forms of angina pectoris: Secondary | ICD-10-CM | POA: Diagnosis not present

## 2023-02-22 DIAGNOSIS — J439 Emphysema, unspecified: Secondary | ICD-10-CM | POA: Diagnosis not present

## 2023-02-22 DIAGNOSIS — R001 Bradycardia, unspecified: Secondary | ICD-10-CM | POA: Diagnosis not present

## 2023-02-22 DIAGNOSIS — I739 Peripheral vascular disease, unspecified: Secondary | ICD-10-CM | POA: Diagnosis not present

## 2023-02-22 DIAGNOSIS — F17290 Nicotine dependence, other tobacco product, uncomplicated: Secondary | ICD-10-CM | POA: Diagnosis not present

## 2023-02-23 ENCOUNTER — Telehealth: Payer: Self-pay | Admitting: Family Medicine

## 2023-02-23 DIAGNOSIS — J811 Chronic pulmonary edema: Secondary | ICD-10-CM | POA: Diagnosis not present

## 2023-02-23 DIAGNOSIS — I25118 Atherosclerotic heart disease of native coronary artery with other forms of angina pectoris: Secondary | ICD-10-CM | POA: Diagnosis not present

## 2023-02-23 DIAGNOSIS — Z452 Encounter for adjustment and management of vascular access device: Secondary | ICD-10-CM | POA: Diagnosis not present

## 2023-02-23 DIAGNOSIS — E039 Hypothyroidism, unspecified: Secondary | ICD-10-CM | POA: Diagnosis not present

## 2023-02-23 DIAGNOSIS — R918 Other nonspecific abnormal finding of lung field: Secondary | ICD-10-CM | POA: Diagnosis not present

## 2023-02-23 DIAGNOSIS — I959 Hypotension, unspecified: Secondary | ICD-10-CM | POA: Diagnosis not present

## 2023-02-23 DIAGNOSIS — F17210 Nicotine dependence, cigarettes, uncomplicated: Secondary | ICD-10-CM | POA: Diagnosis not present

## 2023-02-23 DIAGNOSIS — I2510 Atherosclerotic heart disease of native coronary artery without angina pectoris: Secondary | ICD-10-CM | POA: Diagnosis not present

## 2023-02-23 DIAGNOSIS — I6522 Occlusion and stenosis of left carotid artery: Secondary | ICD-10-CM | POA: Diagnosis not present

## 2023-02-23 DIAGNOSIS — I739 Peripheral vascular disease, unspecified: Secondary | ICD-10-CM | POA: Diagnosis not present

## 2023-02-23 DIAGNOSIS — I742 Embolism and thrombosis of arteries of the upper extremities: Secondary | ICD-10-CM | POA: Diagnosis not present

## 2023-02-23 DIAGNOSIS — E785 Hyperlipidemia, unspecified: Secondary | ICD-10-CM | POA: Diagnosis not present

## 2023-02-23 DIAGNOSIS — R0689 Other abnormalities of breathing: Secondary | ICD-10-CM | POA: Diagnosis not present

## 2023-02-23 DIAGNOSIS — R570 Cardiogenic shock: Secondary | ICD-10-CM | POA: Diagnosis not present

## 2023-02-23 DIAGNOSIS — J449 Chronic obstructive pulmonary disease, unspecified: Secondary | ICD-10-CM | POA: Diagnosis not present

## 2023-02-23 DIAGNOSIS — I70201 Unspecified atherosclerosis of native arteries of extremities, right leg: Secondary | ICD-10-CM | POA: Diagnosis not present

## 2023-02-23 DIAGNOSIS — J9601 Acute respiratory failure with hypoxia: Secondary | ICD-10-CM | POA: Diagnosis not present

## 2023-02-23 DIAGNOSIS — I214 Non-ST elevation (NSTEMI) myocardial infarction: Secondary | ICD-10-CM | POA: Diagnosis not present

## 2023-02-23 DIAGNOSIS — Z4682 Encounter for fitting and adjustment of non-vascular catheter: Secondary | ICD-10-CM | POA: Diagnosis not present

## 2023-02-23 DIAGNOSIS — I6523 Occlusion and stenosis of bilateral carotid arteries: Secondary | ICD-10-CM | POA: Diagnosis not present

## 2023-02-23 DIAGNOSIS — J90 Pleural effusion, not elsewhere classified: Secondary | ICD-10-CM | POA: Diagnosis not present

## 2023-02-23 DIAGNOSIS — I998 Other disorder of circulatory system: Secondary | ICD-10-CM | POA: Diagnosis not present

## 2023-02-23 DIAGNOSIS — K219 Gastro-esophageal reflux disease without esophagitis: Secondary | ICD-10-CM | POA: Diagnosis not present

## 2023-02-23 NOTE — Telephone Encounter (Signed)
Requested paperwork has been faxed to contact information [phone]

## 2023-02-23 NOTE — Telephone Encounter (Signed)
Copied from CRM [id number]. Topic: General - Other >> Feb 23, 2023 12:13 PM Priscille Loveless wrote: Reason for CRM: Lahoma Rocker @ [facility] is requesting the last office visits to be faxed over to [phone]

## 2023-02-24 DIAGNOSIS — J811 Chronic pulmonary edema: Secondary | ICD-10-CM | POA: Diagnosis not present

## 2023-02-24 DIAGNOSIS — I742 Embolism and thrombosis of arteries of the upper extremities: Secondary | ICD-10-CM | POA: Diagnosis not present

## 2023-02-24 DIAGNOSIS — J9601 Acute respiratory failure with hypoxia: Secondary | ICD-10-CM | POA: Diagnosis not present

## 2023-02-24 DIAGNOSIS — J90 Pleural effusion, not elsewhere classified: Secondary | ICD-10-CM | POA: Diagnosis not present

## 2023-02-24 DIAGNOSIS — Z4682 Encounter for fitting and adjustment of non-vascular catheter: Secondary | ICD-10-CM | POA: Diagnosis not present

## 2023-02-24 DIAGNOSIS — I998 Other disorder of circulatory system: Secondary | ICD-10-CM | POA: Diagnosis not present

## 2023-02-24 DIAGNOSIS — I82622 Acute embolism and thrombosis of deep veins of left upper extremity: Secondary | ICD-10-CM | POA: Diagnosis not present

## 2023-02-24 DIAGNOSIS — I771 Stricture of artery: Secondary | ICD-10-CM | POA: Diagnosis not present

## 2023-02-24 DIAGNOSIS — R918 Other nonspecific abnormal finding of lung field: Secondary | ICD-10-CM | POA: Diagnosis not present

## 2023-02-24 DIAGNOSIS — I25118 Atherosclerotic heart disease of native coronary artery with other forms of angina pectoris: Secondary | ICD-10-CM | POA: Diagnosis not present

## 2023-02-24 DIAGNOSIS — I70201 Unspecified atherosclerosis of native arteries of extremities, right leg: Secondary | ICD-10-CM | POA: Diagnosis not present

## 2023-02-24 DIAGNOSIS — Z4659 Encounter for fitting and adjustment of other gastrointestinal appliance and device: Secondary | ICD-10-CM | POA: Diagnosis not present

## 2023-02-25 DIAGNOSIS — J90 Pleural effusion, not elsewhere classified: Secondary | ICD-10-CM | POA: Diagnosis not present

## 2023-02-25 DIAGNOSIS — I25118 Atherosclerotic heart disease of native coronary artery with other forms of angina pectoris: Secondary | ICD-10-CM | POA: Diagnosis not present

## 2023-02-25 DIAGNOSIS — I6522 Occlusion and stenosis of left carotid artery: Secondary | ICD-10-CM | POA: Diagnosis not present

## 2023-02-25 DIAGNOSIS — R569 Unspecified convulsions: Secondary | ICD-10-CM | POA: Diagnosis not present

## 2023-02-25 DIAGNOSIS — J9601 Acute respiratory failure with hypoxia: Secondary | ICD-10-CM | POA: Diagnosis not present

## 2023-02-25 DIAGNOSIS — J811 Chronic pulmonary edema: Secondary | ICD-10-CM | POA: Diagnosis not present

## 2023-02-25 DIAGNOSIS — I739 Peripheral vascular disease, unspecified: Secondary | ICD-10-CM | POA: Diagnosis not present

## 2023-02-25 DIAGNOSIS — I214 Non-ST elevation (NSTEMI) myocardial infarction: Secondary | ICD-10-CM | POA: Diagnosis not present

## 2023-02-25 DIAGNOSIS — J9602 Acute respiratory failure with hypercapnia: Secondary | ICD-10-CM | POA: Diagnosis not present

## 2023-02-25 DIAGNOSIS — I6523 Occlusion and stenosis of bilateral carotid arteries: Secondary | ICD-10-CM | POA: Diagnosis not present

## 2023-02-25 DIAGNOSIS — J9690 Respiratory failure, unspecified, unspecified whether with hypoxia or hypercapnia: Secondary | ICD-10-CM | POA: Diagnosis not present

## 2023-02-25 DIAGNOSIS — R579 Shock, unspecified: Secondary | ICD-10-CM | POA: Diagnosis not present

## 2023-02-26 ENCOUNTER — Telehealth: Payer: Self-pay | Admitting: Family Medicine

## 2023-02-26 NOTE — Telephone Encounter (Signed)
Pt's granddaughter [name] is calling in because she wants to know the company that pt's Oxygen tanks were through so she can reach out to them.

## 2023-02-27 ENCOUNTER — Inpatient Hospital Stay: Payer: Medicare HMO | Admitting: Oncology

## 2023-02-27 NOTE — Telephone Encounter (Signed)
Ashly with [employer] called in. Informed him that pt's granddaughter wanted to know if they knew who pt got her oxygen tanks from so they could have them picked up. Ashly states he's going to look into it and reach out to pt's granddaughter regarding the oxygen tanks.

## 2023-02-27 NOTE — Telephone Encounter (Signed)
[name], CMA was able to pull the oxygen company's information. Patient's granddaughter [name] was provided with the contact information which was [employer] at [phone]. [name] verbalized understanding and has no further questions.

## 2023-02-27 NOTE — Telephone Encounter (Signed)
Spoke with patient granddaughter [name] to follow up with her previous call. Informed her I would reach out to the [employer] representative Morrie Sheldon to follow up with her question in regards to pick up of oxygen tanks. Kandis Mannan I would give her a call back once I have her back from [place].   OK for [facility] to gather more information if Morrie Sheldon from [employer] calls back in regards to where she can reach out to have the oxygen tanks picked up.

## 2023-03-03 DEATH — deceased

## 2023-03-07 ENCOUNTER — Other Ambulatory Visit: Payer: Self-pay | Admitting: Family Medicine

## 2023-03-29 ENCOUNTER — Encounter: Payer: Medicare PPO | Admitting: Family Medicine

## 2023-07-04 ENCOUNTER — Ambulatory Visit: Payer: Medicare PPO | Admitting: Radiation Oncology

## 2023-08-22 ENCOUNTER — Ambulatory Visit: Payer: Medicare PPO

## 2023-09-24 IMAGING — MR MR HEAD WO/W CM
14 of 16 series · 28 of 48 positions shown · IV contrast (6ml Gadavist)
Comparison: MRI of the brain August 18, 2004.

CLINICAL DATA: Malignant neoplasm of overlapping sites of cervix
(HCC) 75C.U (3PF-BM-CM). Lung cancer follow up.

EXAM:
MRI HEAD WITHOUT AND WITH CONTRAST
TECHNIQUE: Multiplanar, multiecho pulse sequences of the brain and surrounding
structures were obtained without and with intravenous contrast.
CONTRAST:  6mL GADAVIST GADOBUTROL 1 MMOL/ML IV SOLN

[Series 2: T1 · sagittal · 3.0mm · 0.38mm/px · 1 of 38 slices shown]
[im 1/38]
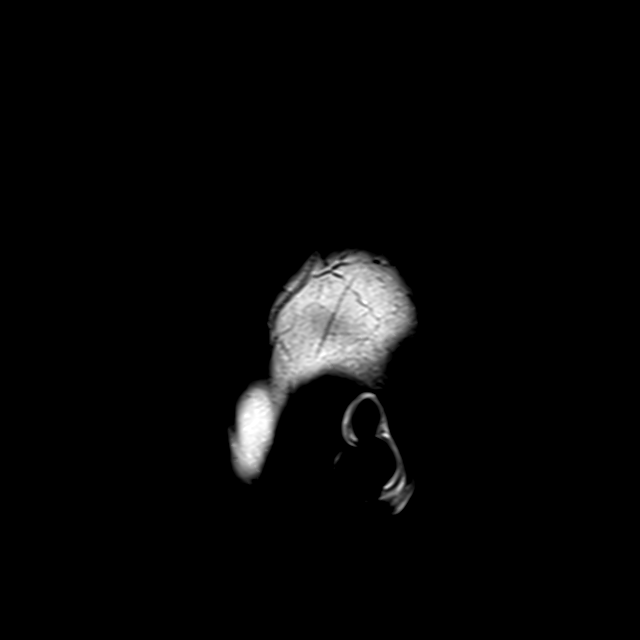

[Series 3: ax dwi_tracew · axial · 3.0mm · 0.60mm/px · z∈[-94,+65]mm · 2 of 54 slices shown]
[im 1/54]
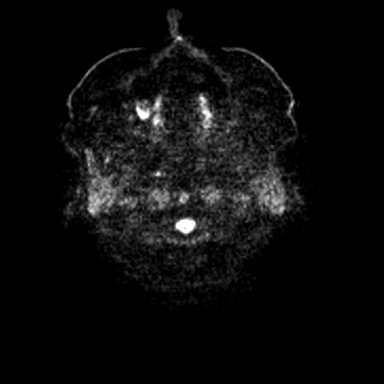
[im 54/54]
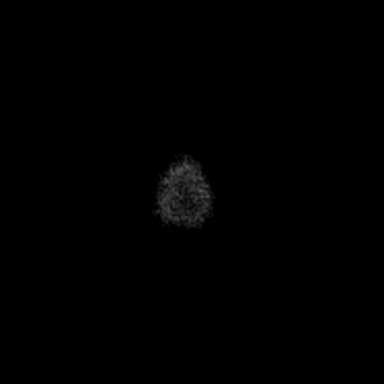

[Series 4: ax dwi_adc · axial · 3.0mm · 0.60mm/px · z∈[-94,+65]mm · 2 of 54 slices shown]
[im 1/54]
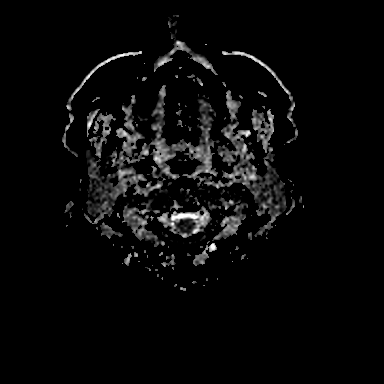
[im 54/54]
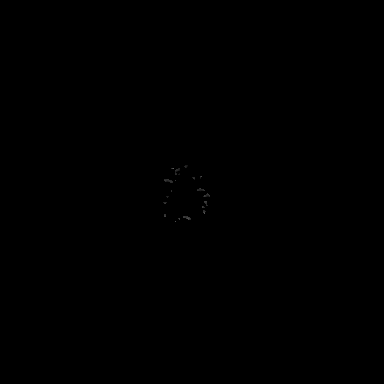

[Series 5: T2 · axial · 5.0mm · 0.53mm/px · 1 of 28 slices shown (1 of 2)]
[im 1/28]
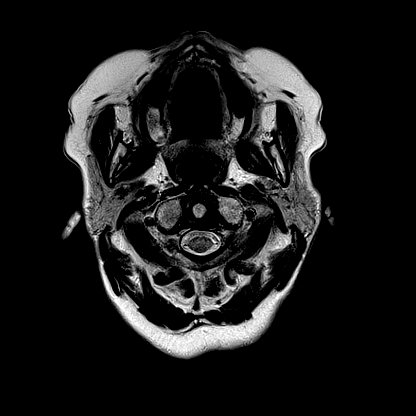

[Series 6: mag_images · axial · 3.0mm · 0.90mm/px · z∈[-97,+68]mm · 2 of 56 slices shown]
[im 1/56]
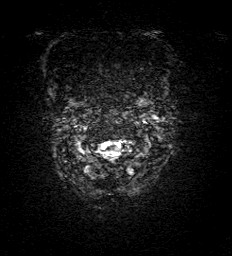
[im 56/56]
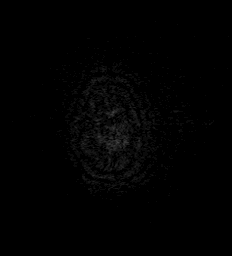

[Series 7: pha_images · axial · 3.0mm · 0.90mm/px · z∈[-97,+68]mm · 2 of 56 slices shown]
[im 1/56]
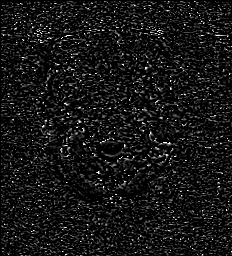
[im 56/56]
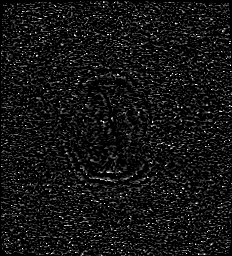

[Series 8: swi_images · axial · 3.0mm · 0.90mm/px · z∈[-97,+68]mm · 2 of 56 slices shown]
[im 1/56]
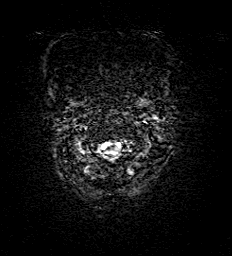
[im 56/56]
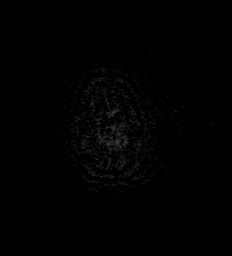

[Series 10: FLAIR · axial · 3.0mm · 0.53mm/px · z∈[-96,+66]mm · 2 of 55 slices shown]
[im 1/55]
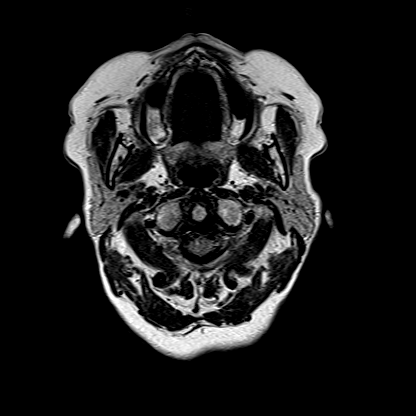
[im 55/55]
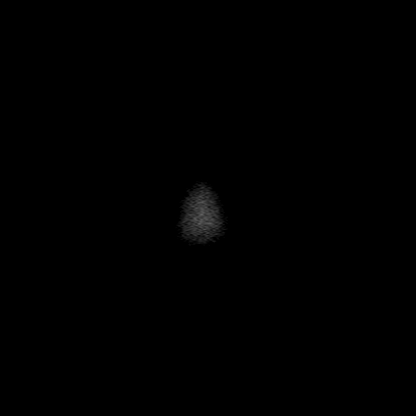

[Series 11: T2 · sagittal · non-contrast · 1.0mm · 0.98mm/px · 5 of 176 slices shown (2 of 2)]
[im 1/176]
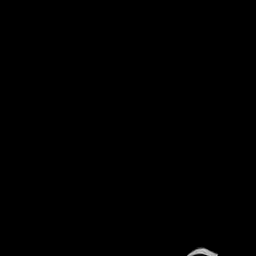
[im 44/176]
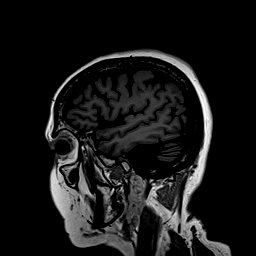
[im 88/176]
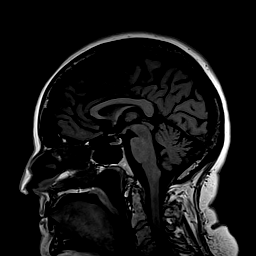
[im 132/176]
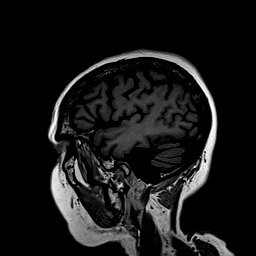
[im 176/176]
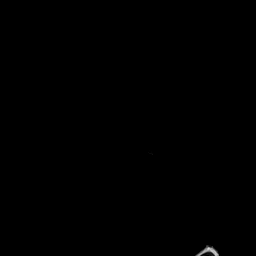

[Series 12: T2 post-contrast · coronal · 3.0mm · 0.57mm/px · 1 of 48 slices shown (1 of 2)]
[im 1/48]
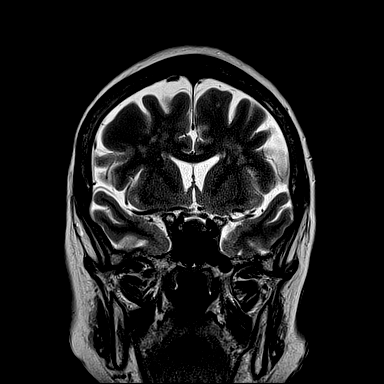

[Series 13: T2 post-contrast · sagittal · 1.0mm · 0.98mm/px · 5 of 176 slices shown (2 of 2)]
[im 1/176]
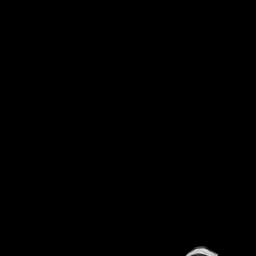
[im 44/176]
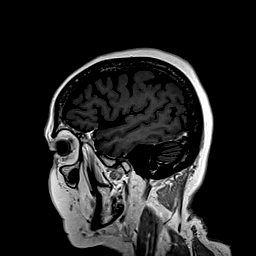
[im 88/176]
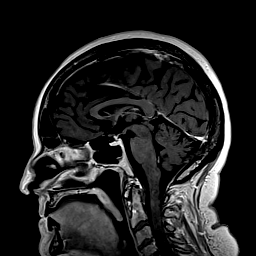
[im 132/176]
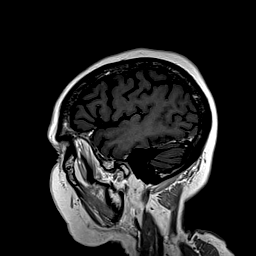
[im 176/176]
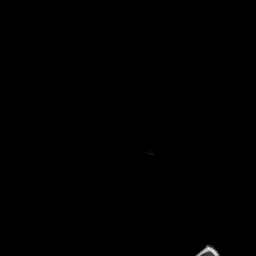

[Series 14: T1 post-contrast · coronal · 3.0mm · 0.29mm/px · 1 of 49 slices shown (1 of 2)]
[im 1/49]
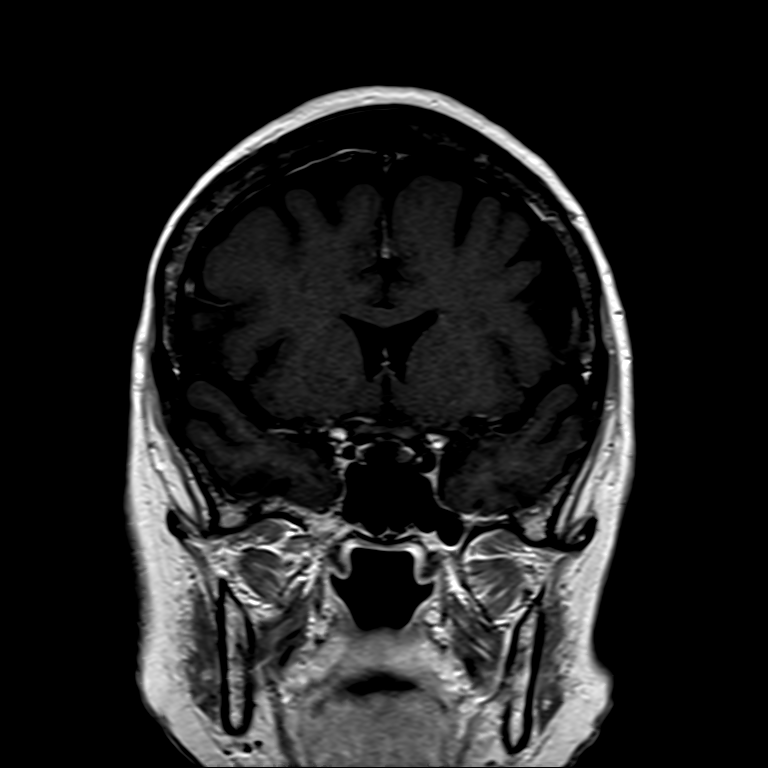

[Series 15: T1 post-contrast · sagittal · 3.0mm · 0.38mm/px · 1 of 38 slices shown (2 of 2)]
[im 1/38]
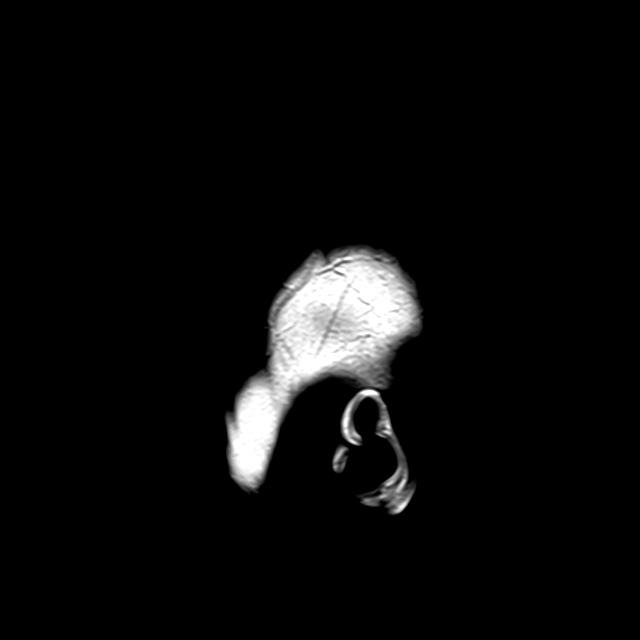

[Series 1024: mpr axial pre · axial · non-contrast · 1.0mm · 0.24mm/px · 1 of 178 slices shown]
[im 1/178]
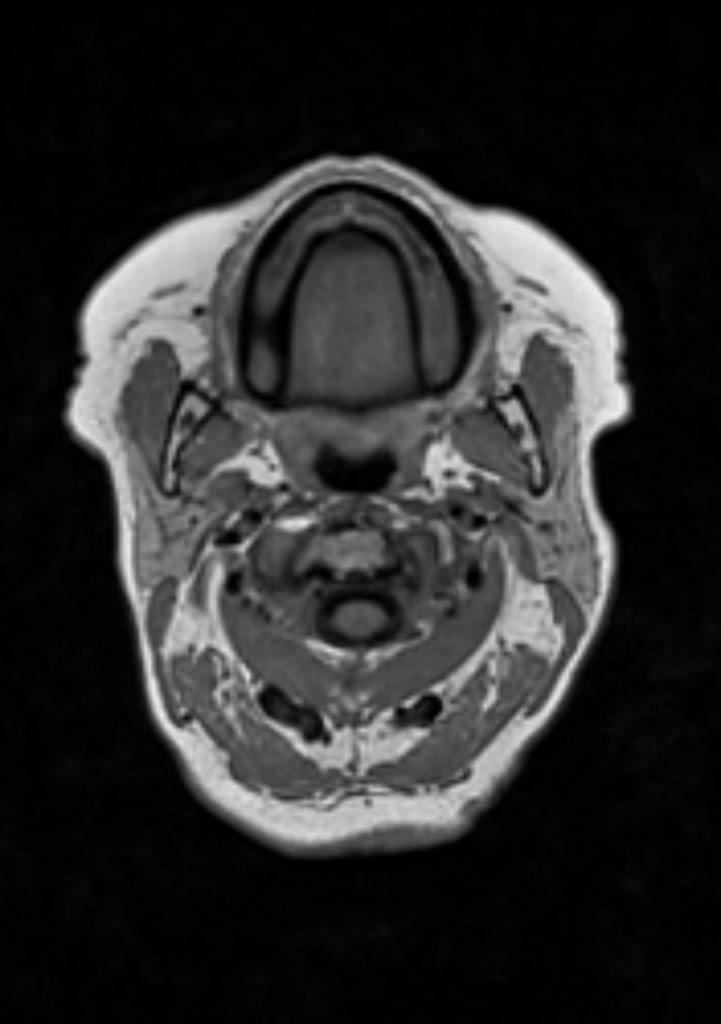

[28 of 48 positions shown; findings below may reference images not displayed]

FINDINGS: Brain: No acute infarction, hemorrhage, hydrocephalus, extra-axial
collection or mass lesion. Scattered foci of T2 hyperintensity are
seen within the white matter of cerebral hemispheres, most likely
small vessel ischemia, mildly progressed since prior MRI. A few
punctate foci of susceptibility artifact are seen scattered in the
right cerebral hemisphere, may represent hemosiderin deposits. No
focus of abnormal contrast enhancement to suggest intracranial
metastatic disease.

Vascular: Normal flow voids.

Skull and upper cervical spine: Normal marrow signal.

Sinuses/Orbits: Negative.

Other: Trace mucosal thickening in the bilateral mastoid cells.
Epidermal inclusion cyst measuring 1.4 cm posteriorly in the neck
subcutaneous.
IMPRESSION: 1. No evidence of intracranial metastatic disease.
2. Mild-to-moderate chronic microvascular ischemic changes of the
white.
